# Patient Record
Sex: Female | Born: 1955 | Race: Black or African American | Hispanic: No | Marital: Single | Smoking: Former smoker
Health system: Southern US, Community
[De-identification: ages and names within clinical notes are randomized; demographics above are authoritative.]

## PROBLEM LIST (undated history)

## (undated) DIAGNOSIS — Z923 Personal history of irradiation: Secondary | ICD-10-CM

## (undated) DIAGNOSIS — E079 Disorder of thyroid, unspecified: Secondary | ICD-10-CM

## (undated) DIAGNOSIS — N189 Chronic kidney disease, unspecified: Secondary | ICD-10-CM

## (undated) DIAGNOSIS — M6740 Ganglion, unspecified site: Secondary | ICD-10-CM

## (undated) DIAGNOSIS — R0600 Dyspnea, unspecified: Secondary | ICD-10-CM

## (undated) DIAGNOSIS — Z780 Asymptomatic menopausal state: Secondary | ICD-10-CM

## (undated) DIAGNOSIS — E785 Hyperlipidemia, unspecified: Secondary | ICD-10-CM

## (undated) DIAGNOSIS — K219 Gastro-esophageal reflux disease without esophagitis: Secondary | ICD-10-CM

## (undated) DIAGNOSIS — I10 Essential (primary) hypertension: Secondary | ICD-10-CM

## (undated) DIAGNOSIS — D509 Iron deficiency anemia, unspecified: Secondary | ICD-10-CM

## (undated) DIAGNOSIS — T884XXA Failed or difficult intubation, initial encounter: Secondary | ICD-10-CM

## (undated) DIAGNOSIS — Z720 Tobacco use: Secondary | ICD-10-CM

## (undated) DIAGNOSIS — C801 Malignant (primary) neoplasm, unspecified: Secondary | ICD-10-CM

## (undated) HISTORY — DX: Disorder of thyroid, unspecified: E07.9

## (undated) HISTORY — PX: REDUCTION MAMMAPLASTY: SUR839

## (undated) HISTORY — PX: NECK SURGERY: SHX720

## (undated) HISTORY — DX: Personal history of irradiation: Z92.3

## (undated) HISTORY — DX: Tobacco use: Z72.0

## (undated) HISTORY — DX: Iron deficiency anemia, unspecified: D50.9

## (undated) HISTORY — DX: Gastro-esophageal reflux disease without esophagitis: K21.9

## (undated) HISTORY — DX: Hyperlipidemia, unspecified: E78.5

## (undated) HISTORY — DX: Chronic kidney disease, unspecified: N18.9

## (undated) HISTORY — DX: Malignant (primary) neoplasm, unspecified: C80.1

## (undated) HISTORY — DX: Essential (primary) hypertension: I10

## (undated) HISTORY — DX: Asymptomatic menopausal state: Z78.0

## (undated) HISTORY — DX: Ganglion, unspecified site: M67.40

---

## 1995-09-24 DIAGNOSIS — E118 Type 2 diabetes mellitus with unspecified complications: Secondary | ICD-10-CM

## 1999-11-16 ENCOUNTER — Emergency Department (HOSPITAL_COMMUNITY): Admission: EM | Admit: 1999-11-16 | Discharge: 1999-11-16 | Payer: Self-pay | Admitting: Emergency Medicine

## 2002-05-25 ENCOUNTER — Emergency Department (HOSPITAL_COMMUNITY): Admission: EM | Admit: 2002-05-25 | Discharge: 2002-05-25 | Payer: Self-pay | Admitting: Emergency Medicine

## 2002-05-25 ENCOUNTER — Encounter: Payer: Self-pay | Admitting: Emergency Medicine

## 2002-05-26 ENCOUNTER — Emergency Department (HOSPITAL_COMMUNITY): Admission: EM | Admit: 2002-05-26 | Discharge: 2002-05-27 | Payer: Self-pay | Admitting: Emergency Medicine

## 2002-06-30 ENCOUNTER — Encounter: Admission: RE | Admit: 2002-06-30 | Discharge: 2002-06-30 | Payer: Self-pay | Admitting: Internal Medicine

## 2002-07-05 ENCOUNTER — Encounter: Admission: RE | Admit: 2002-07-05 | Discharge: 2002-07-05 | Payer: Self-pay | Admitting: Internal Medicine

## 2002-07-13 ENCOUNTER — Encounter: Admission: RE | Admit: 2002-07-13 | Discharge: 2002-07-13 | Payer: Self-pay | Admitting: Internal Medicine

## 2002-09-07 ENCOUNTER — Encounter: Admission: RE | Admit: 2002-09-07 | Discharge: 2002-09-07 | Payer: Self-pay | Admitting: Internal Medicine

## 2002-11-03 ENCOUNTER — Encounter: Admission: RE | Admit: 2002-11-03 | Discharge: 2002-11-03 | Payer: Self-pay | Admitting: Internal Medicine

## 2002-12-19 ENCOUNTER — Emergency Department (HOSPITAL_COMMUNITY): Admission: EM | Admit: 2002-12-19 | Discharge: 2002-12-19 | Payer: Self-pay | Admitting: Emergency Medicine

## 2003-01-17 ENCOUNTER — Encounter: Admission: RE | Admit: 2003-01-17 | Discharge: 2003-01-17 | Payer: Self-pay | Admitting: Internal Medicine

## 2003-01-27 ENCOUNTER — Encounter: Admission: RE | Admit: 2003-01-27 | Discharge: 2003-01-27 | Payer: Self-pay | Admitting: Internal Medicine

## 2003-01-31 ENCOUNTER — Encounter: Admission: RE | Admit: 2003-01-31 | Discharge: 2003-01-31 | Payer: Self-pay | Admitting: Internal Medicine

## 2003-06-19 ENCOUNTER — Emergency Department (HOSPITAL_COMMUNITY): Admission: EM | Admit: 2003-06-19 | Discharge: 2003-06-19 | Payer: Self-pay

## 2003-07-01 ENCOUNTER — Encounter: Admission: RE | Admit: 2003-07-01 | Discharge: 2003-07-01 | Payer: Self-pay | Admitting: Internal Medicine

## 2003-09-07 ENCOUNTER — Encounter: Admission: RE | Admit: 2003-09-07 | Discharge: 2003-09-07 | Payer: Self-pay | Admitting: Internal Medicine

## 2003-09-27 ENCOUNTER — Encounter: Admission: RE | Admit: 2003-09-27 | Discharge: 2003-09-27 | Payer: Self-pay | Admitting: Internal Medicine

## 2003-10-24 ENCOUNTER — Encounter: Admission: RE | Admit: 2003-10-24 | Discharge: 2003-10-24 | Payer: Self-pay | Admitting: Internal Medicine

## 2003-10-26 ENCOUNTER — Encounter: Admission: RE | Admit: 2003-10-26 | Discharge: 2003-10-26 | Payer: Self-pay | Admitting: Internal Medicine

## 2003-10-30 ENCOUNTER — Emergency Department (HOSPITAL_COMMUNITY): Admission: EM | Admit: 2003-10-30 | Discharge: 2003-10-30 | Payer: Self-pay | Admitting: Emergency Medicine

## 2003-11-04 ENCOUNTER — Encounter: Admission: RE | Admit: 2003-11-04 | Discharge: 2003-11-04 | Payer: Self-pay | Admitting: Internal Medicine

## 2003-11-07 ENCOUNTER — Encounter: Admission: RE | Admit: 2003-11-07 | Discharge: 2003-11-07 | Payer: Self-pay | Admitting: Internal Medicine

## 2003-11-14 ENCOUNTER — Ambulatory Visit (HOSPITAL_COMMUNITY): Admission: RE | Admit: 2003-11-14 | Discharge: 2003-11-14 | Payer: Self-pay | Admitting: Internal Medicine

## 2003-11-18 ENCOUNTER — Encounter: Admission: RE | Admit: 2003-11-18 | Discharge: 2003-11-18 | Payer: Self-pay | Admitting: Internal Medicine

## 2003-12-26 ENCOUNTER — Encounter: Admission: RE | Admit: 2003-12-26 | Discharge: 2003-12-26 | Payer: Self-pay | Admitting: Internal Medicine

## 2003-12-26 ENCOUNTER — Encounter (INDEPENDENT_AMBULATORY_CARE_PROVIDER_SITE_OTHER): Payer: Self-pay | Admitting: *Deleted

## 2003-12-28 ENCOUNTER — Ambulatory Visit (HOSPITAL_COMMUNITY): Admission: RE | Admit: 2003-12-28 | Discharge: 2003-12-28 | Payer: Self-pay | Admitting: Internal Medicine

## 2004-01-09 ENCOUNTER — Encounter: Admission: RE | Admit: 2004-01-09 | Discharge: 2004-01-09 | Payer: Self-pay | Admitting: Internal Medicine

## 2004-02-29 ENCOUNTER — Encounter: Admission: RE | Admit: 2004-02-29 | Discharge: 2004-02-29 | Payer: Self-pay | Admitting: Internal Medicine

## 2004-03-08 ENCOUNTER — Encounter: Admission: RE | Admit: 2004-03-08 | Discharge: 2004-03-08 | Payer: Self-pay | Admitting: Obstetrics and Gynecology

## 2004-03-13 ENCOUNTER — Ambulatory Visit (HOSPITAL_COMMUNITY): Admission: RE | Admit: 2004-03-13 | Discharge: 2004-03-13 | Payer: Self-pay | Admitting: *Deleted

## 2004-04-02 ENCOUNTER — Ambulatory Visit (HOSPITAL_COMMUNITY): Admission: RE | Admit: 2004-04-02 | Discharge: 2004-04-02 | Payer: Self-pay | Admitting: Internal Medicine

## 2004-07-31 ENCOUNTER — Ambulatory Visit (HOSPITAL_COMMUNITY): Admission: RE | Admit: 2004-07-31 | Discharge: 2004-07-31 | Payer: Self-pay | Admitting: Internal Medicine

## 2004-07-31 ENCOUNTER — Ambulatory Visit: Payer: Self-pay | Admitting: Internal Medicine

## 2004-10-01 ENCOUNTER — Ambulatory Visit (HOSPITAL_COMMUNITY): Admission: RE | Admit: 2004-10-01 | Discharge: 2004-10-01 | Payer: Self-pay | Admitting: Family Medicine

## 2004-12-26 ENCOUNTER — Ambulatory Visit: Payer: Self-pay | Admitting: Internal Medicine

## 2005-02-26 ENCOUNTER — Ambulatory Visit: Payer: Self-pay | Admitting: Internal Medicine

## 2005-02-27 ENCOUNTER — Ambulatory Visit: Payer: Self-pay | Admitting: Internal Medicine

## 2005-07-19 ENCOUNTER — Ambulatory Visit: Payer: Self-pay | Admitting: Hospitalist

## 2005-10-17 ENCOUNTER — Ambulatory Visit: Payer: Self-pay | Admitting: Internal Medicine

## 2005-10-31 ENCOUNTER — Ambulatory Visit: Payer: Self-pay | Admitting: Internal Medicine

## 2005-11-13 ENCOUNTER — Ambulatory Visit: Payer: Self-pay | Admitting: Internal Medicine

## 2005-11-18 ENCOUNTER — Ambulatory Visit: Payer: Self-pay | Admitting: Internal Medicine

## 2006-01-15 ENCOUNTER — Ambulatory Visit: Payer: Self-pay | Admitting: Internal Medicine

## 2006-01-30 ENCOUNTER — Ambulatory Visit (HOSPITAL_COMMUNITY): Admission: RE | Admit: 2006-01-30 | Discharge: 2006-01-30 | Payer: Self-pay | Admitting: Obstetrics and Gynecology

## 2006-04-17 ENCOUNTER — Ambulatory Visit: Payer: Self-pay | Admitting: Internal Medicine

## 2006-06-11 ENCOUNTER — Ambulatory Visit: Payer: Self-pay | Admitting: Hospitalist

## 2006-06-19 ENCOUNTER — Ambulatory Visit: Payer: Self-pay | Admitting: Internal Medicine

## 2006-06-23 ENCOUNTER — Ambulatory Visit: Payer: Self-pay | Admitting: Hospitalist

## 2006-07-14 ENCOUNTER — Ambulatory Visit: Payer: Self-pay | Admitting: Internal Medicine

## 2006-07-15 ENCOUNTER — Ambulatory Visit: Payer: Self-pay | Admitting: Internal Medicine

## 2006-07-16 ENCOUNTER — Ambulatory Visit: Payer: Self-pay | Admitting: Internal Medicine

## 2006-07-25 ENCOUNTER — Ambulatory Visit: Payer: Self-pay | Admitting: Internal Medicine

## 2006-07-25 LAB — HM COLONOSCOPY: HM Colonoscopy: NORMAL

## 2006-08-19 DIAGNOSIS — M6740 Ganglion, unspecified site: Secondary | ICD-10-CM

## 2006-08-19 DIAGNOSIS — D509 Iron deficiency anemia, unspecified: Secondary | ICD-10-CM

## 2006-08-19 DIAGNOSIS — I10 Essential (primary) hypertension: Secondary | ICD-10-CM | POA: Insufficient documentation

## 2006-08-19 DIAGNOSIS — F17200 Nicotine dependence, unspecified, uncomplicated: Secondary | ICD-10-CM | POA: Insufficient documentation

## 2006-08-19 DIAGNOSIS — N951 Menopausal and female climacteric states: Secondary | ICD-10-CM

## 2006-08-28 ENCOUNTER — Ambulatory Visit: Payer: Self-pay | Admitting: Internal Medicine

## 2006-10-21 ENCOUNTER — Telehealth: Payer: Self-pay | Admitting: *Deleted

## 2006-11-24 ENCOUNTER — Telehealth: Payer: Self-pay | Admitting: *Deleted

## 2006-11-27 ENCOUNTER — Emergency Department (HOSPITAL_COMMUNITY): Admission: EM | Admit: 2006-11-27 | Discharge: 2006-11-27 | Payer: Self-pay | Admitting: Family Medicine

## 2006-12-26 ENCOUNTER — Encounter (INDEPENDENT_AMBULATORY_CARE_PROVIDER_SITE_OTHER): Payer: Self-pay | Admitting: Internal Medicine

## 2006-12-26 ENCOUNTER — Ambulatory Visit: Payer: Self-pay | Admitting: *Deleted

## 2006-12-29 ENCOUNTER — Telehealth: Payer: Self-pay | Admitting: *Deleted

## 2007-01-06 ENCOUNTER — Ambulatory Visit: Payer: Self-pay | Admitting: Hospitalist

## 2007-01-08 ENCOUNTER — Telehealth: Payer: Self-pay | Admitting: *Deleted

## 2007-01-29 ENCOUNTER — Ambulatory Visit: Payer: Self-pay | Admitting: Internal Medicine

## 2007-01-30 ENCOUNTER — Telehealth (INDEPENDENT_AMBULATORY_CARE_PROVIDER_SITE_OTHER): Payer: Self-pay | Admitting: Internal Medicine

## 2007-01-30 ENCOUNTER — Ambulatory Visit (HOSPITAL_COMMUNITY): Admission: RE | Admit: 2007-01-30 | Discharge: 2007-01-30 | Payer: Self-pay | Admitting: Internal Medicine

## 2007-02-05 ENCOUNTER — Ambulatory Visit (HOSPITAL_COMMUNITY): Admission: RE | Admit: 2007-02-05 | Discharge: 2007-02-05 | Payer: Self-pay | Admitting: Obstetrics and Gynecology

## 2007-02-06 ENCOUNTER — Ambulatory Visit: Payer: Self-pay | Admitting: Hospitalist

## 2007-02-06 ENCOUNTER — Encounter (INDEPENDENT_AMBULATORY_CARE_PROVIDER_SITE_OTHER): Payer: Self-pay | Admitting: Internal Medicine

## 2007-02-09 LAB — CONVERTED CEMR LAB
CO2: 23 meq/L (ref 19–32)
Calcium: 10.1 mg/dL (ref 8.4–10.5)
Creatinine, Ser: 0.92 mg/dL (ref 0.40–1.20)
Eosinophils Relative: 1 % (ref 0–5)
Glucose, Bld: 165 mg/dL — ABNORMAL HIGH (ref 70–99)
HCT: 39.8 % (ref 36.0–46.0)
Hemoglobin: 13.1 g/dL (ref 12.0–15.0)
Leukocytes, UA: NEGATIVE
Lymphocytes Relative: 41 % (ref 12–46)
Lymphs Abs: 4.4 10*3/uL — ABNORMAL HIGH (ref 0.7–3.3)
Microalb Creat Ratio: 82.7 mg/g — ABNORMAL HIGH (ref 0.0–30.0)
Monocytes Absolute: 0.5 10*3/uL (ref 0.2–0.7)
Nitrite: NEGATIVE
Protein, ur: NEGATIVE mg/dL
Sodium: 136 meq/L (ref 135–145)
Total Bilirubin: 0.3 mg/dL (ref 0.3–1.2)
Total Protein: 7.8 g/dL (ref 6.0–8.3)
Urine Glucose: NEGATIVE mg/dL
WBC: 10.6 10*3/uL — ABNORMAL HIGH (ref 4.0–10.5)

## 2007-02-11 ENCOUNTER — Emergency Department (HOSPITAL_COMMUNITY): Admission: EM | Admit: 2007-02-11 | Discharge: 2007-02-11 | Payer: Self-pay | Admitting: Emergency Medicine

## 2007-02-18 ENCOUNTER — Telehealth (INDEPENDENT_AMBULATORY_CARE_PROVIDER_SITE_OTHER): Payer: Self-pay | Admitting: *Deleted

## 2007-02-25 ENCOUNTER — Encounter (INDEPENDENT_AMBULATORY_CARE_PROVIDER_SITE_OTHER): Payer: Self-pay | Admitting: Internal Medicine

## 2007-02-25 ENCOUNTER — Ambulatory Visit: Payer: Self-pay | Admitting: Internal Medicine

## 2007-02-25 ENCOUNTER — Telehealth: Payer: Self-pay | Admitting: *Deleted

## 2007-02-25 LAB — CONVERTED CEMR LAB: Blood Glucose, Fingerstick: 169

## 2007-02-26 LAB — CONVERTED CEMR LAB
Calcium: 9.7 mg/dL (ref 8.4–10.5)
Chloride: 95 meq/L — ABNORMAL LOW (ref 96–112)
Creatinine, Ser: 0.65 mg/dL (ref 0.40–1.20)

## 2007-03-06 ENCOUNTER — Encounter (INDEPENDENT_AMBULATORY_CARE_PROVIDER_SITE_OTHER): Payer: Self-pay | Admitting: Internal Medicine

## 2007-03-06 ENCOUNTER — Ambulatory Visit: Payer: Self-pay | Admitting: Internal Medicine

## 2007-03-09 LAB — CONVERTED CEMR LAB
BUN: 7 mg/dL (ref 6–23)
Chloride: 99 meq/L (ref 96–112)
Potassium: 3.1 meq/L — ABNORMAL LOW (ref 3.5–5.3)
Sodium: 135 meq/L (ref 135–145)

## 2007-03-13 ENCOUNTER — Encounter (INDEPENDENT_AMBULATORY_CARE_PROVIDER_SITE_OTHER): Payer: Self-pay | Admitting: Internal Medicine

## 2007-03-13 ENCOUNTER — Ambulatory Visit: Payer: Self-pay | Admitting: Internal Medicine

## 2007-03-13 LAB — CONVERTED CEMR LAB
CO2: 21 meq/L (ref 19–32)
Chloride: 96 meq/L (ref 96–112)
Glucose, Bld: 109 mg/dL — ABNORMAL HIGH (ref 70–99)
Potassium: 3.3 meq/L — ABNORMAL LOW (ref 3.5–5.3)
Sodium: 132 meq/L — ABNORMAL LOW (ref 135–145)

## 2007-03-30 ENCOUNTER — Ambulatory Visit: Payer: Self-pay | Admitting: Pulmonary Disease

## 2007-04-08 ENCOUNTER — Encounter (INDEPENDENT_AMBULATORY_CARE_PROVIDER_SITE_OTHER): Payer: Self-pay | Admitting: Internal Medicine

## 2007-04-08 ENCOUNTER — Ambulatory Visit: Admission: RE | Admit: 2007-04-08 | Discharge: 2007-04-08 | Payer: Self-pay | Admitting: Pulmonary Disease

## 2007-04-22 ENCOUNTER — Ambulatory Visit: Payer: Self-pay | Admitting: Pulmonary Disease

## 2007-04-30 ENCOUNTER — Encounter (INDEPENDENT_AMBULATORY_CARE_PROVIDER_SITE_OTHER): Payer: Self-pay | Admitting: Internal Medicine

## 2007-04-30 ENCOUNTER — Ambulatory Visit: Payer: Self-pay | Admitting: Infectious Disease

## 2007-05-03 LAB — CONVERTED CEMR LAB
BUN: 6 mg/dL (ref 6–23)
Calcium: 9.4 mg/dL (ref 8.4–10.5)
Chloride: 100 meq/L (ref 96–112)
Creatinine, Ser: 0.71 mg/dL (ref 0.40–1.20)

## 2007-05-12 ENCOUNTER — Telehealth: Payer: Self-pay | Admitting: *Deleted

## 2007-06-03 ENCOUNTER — Telehealth: Payer: Self-pay | Admitting: *Deleted

## 2007-06-05 ENCOUNTER — Ambulatory Visit: Payer: Self-pay | Admitting: Internal Medicine

## 2007-06-05 ENCOUNTER — Encounter (INDEPENDENT_AMBULATORY_CARE_PROVIDER_SITE_OTHER): Payer: Self-pay | Admitting: Internal Medicine

## 2007-06-05 LAB — CONVERTED CEMR LAB
Blood Glucose, Fingerstick: 171
Calcium: 9.5 mg/dL (ref 8.4–10.5)
Chloride: 96 meq/L (ref 96–112)
Creatinine, Ser: 0.71 mg/dL (ref 0.40–1.20)
HCT: 35.4 % — ABNORMAL LOW (ref 36.0–46.0)
Hgb A1c MFr Bld: 8.5 %
Platelets: 279 10*3/uL (ref 150–400)
RDW: 13.8 % (ref 11.5–14.0)
Sodium: 133 meq/L — ABNORMAL LOW (ref 135–145)

## 2007-06-12 ENCOUNTER — Ambulatory Visit: Payer: Self-pay | Admitting: Pulmonary Disease

## 2007-06-24 ENCOUNTER — Telehealth: Payer: Self-pay | Admitting: *Deleted

## 2007-07-01 ENCOUNTER — Ambulatory Visit: Payer: Self-pay | Admitting: Infectious Diseases

## 2007-07-01 ENCOUNTER — Encounter (INDEPENDENT_AMBULATORY_CARE_PROVIDER_SITE_OTHER): Payer: Self-pay | Admitting: Internal Medicine

## 2007-07-01 LAB — CONVERTED CEMR LAB
HDL: 36 mg/dL — ABNORMAL LOW (ref >39)
LDL Cholesterol: 70 mg/dL (ref 0–99)
Triglycerides: 78 mg/dL (ref <150)

## 2007-07-08 ENCOUNTER — Telehealth: Payer: Self-pay | Admitting: *Deleted

## 2007-07-15 ENCOUNTER — Telehealth (INDEPENDENT_AMBULATORY_CARE_PROVIDER_SITE_OTHER): Payer: Self-pay | Admitting: Internal Medicine

## 2007-09-10 ENCOUNTER — Telehealth: Payer: Self-pay | Admitting: Internal Medicine

## 2007-10-15 ENCOUNTER — Ambulatory Visit: Payer: Self-pay | Admitting: Internal Medicine

## 2007-10-15 ENCOUNTER — Encounter: Payer: Self-pay | Admitting: Internal Medicine

## 2007-10-19 ENCOUNTER — Encounter (INDEPENDENT_AMBULATORY_CARE_PROVIDER_SITE_OTHER): Payer: Self-pay | Admitting: Internal Medicine

## 2007-10-19 ENCOUNTER — Ambulatory Visit: Payer: Self-pay | Admitting: Internal Medicine

## 2007-10-21 ENCOUNTER — Encounter (INDEPENDENT_AMBULATORY_CARE_PROVIDER_SITE_OTHER): Payer: Self-pay | Admitting: Internal Medicine

## 2007-10-21 LAB — CONVERTED CEMR LAB
CO2: 25 meq/L (ref 19–32)
Glucose, Bld: 271 mg/dL — ABNORMAL HIGH (ref 70–99)
Potassium: 3 meq/L — ABNORMAL LOW (ref 3.5–5.3)
Sodium: 132 meq/L — ABNORMAL LOW (ref 135–145)

## 2007-10-26 ENCOUNTER — Ambulatory Visit: Payer: Self-pay | Admitting: Hospitalist

## 2007-10-26 ENCOUNTER — Encounter (INDEPENDENT_AMBULATORY_CARE_PROVIDER_SITE_OTHER): Payer: Self-pay | Admitting: Internal Medicine

## 2007-10-26 LAB — CONVERTED CEMR LAB
CO2: 21 meq/L (ref 19–32)
Calcium: 9.8 mg/dL (ref 8.4–10.5)
Creatinine, Ser: 0.85 mg/dL (ref 0.40–1.20)
Glucose, Bld: 116 mg/dL — ABNORMAL HIGH (ref 70–99)

## 2007-11-23 ENCOUNTER — Telehealth (INDEPENDENT_AMBULATORY_CARE_PROVIDER_SITE_OTHER): Payer: Self-pay | Admitting: *Deleted

## 2007-12-24 ENCOUNTER — Encounter (INDEPENDENT_AMBULATORY_CARE_PROVIDER_SITE_OTHER): Payer: Self-pay | Admitting: Internal Medicine

## 2007-12-24 ENCOUNTER — Telehealth (INDEPENDENT_AMBULATORY_CARE_PROVIDER_SITE_OTHER): Payer: Self-pay | Admitting: Internal Medicine

## 2008-01-11 ENCOUNTER — Telehealth (INDEPENDENT_AMBULATORY_CARE_PROVIDER_SITE_OTHER): Payer: Self-pay | Admitting: Internal Medicine

## 2008-01-27 ENCOUNTER — Telehealth (INDEPENDENT_AMBULATORY_CARE_PROVIDER_SITE_OTHER): Payer: Self-pay | Admitting: Internal Medicine

## 2008-02-02 ENCOUNTER — Telehealth (INDEPENDENT_AMBULATORY_CARE_PROVIDER_SITE_OTHER): Payer: Self-pay | Admitting: Internal Medicine

## 2008-02-02 ENCOUNTER — Encounter (INDEPENDENT_AMBULATORY_CARE_PROVIDER_SITE_OTHER): Payer: Self-pay | Admitting: Internal Medicine

## 2008-02-02 ENCOUNTER — Ambulatory Visit: Payer: Self-pay | Admitting: Internal Medicine

## 2008-02-02 LAB — CONVERTED CEMR LAB
ALT: 32 units/L (ref 0–35)
AST: 26 units/L (ref 0–37)
Creatinine, Ser: 0.63 mg/dL (ref 0.40–1.20)
Total Bilirubin: 0.3 mg/dL (ref 0.3–1.2)

## 2008-02-08 ENCOUNTER — Ambulatory Visit (HOSPITAL_COMMUNITY): Admission: RE | Admit: 2008-02-08 | Discharge: 2008-02-08 | Payer: Self-pay | Admitting: Internal Medicine

## 2008-02-16 ENCOUNTER — Ambulatory Visit: Payer: Self-pay | Admitting: Internal Medicine

## 2008-02-16 LAB — CONVERTED CEMR LAB
OCCULT 1: NEGATIVE
OCCULT 2: NEGATIVE
OCCULT 3: NEGATIVE

## 2008-03-01 ENCOUNTER — Encounter (INDEPENDENT_AMBULATORY_CARE_PROVIDER_SITE_OTHER): Payer: Self-pay | Admitting: Internal Medicine

## 2008-03-15 ENCOUNTER — Encounter (INDEPENDENT_AMBULATORY_CARE_PROVIDER_SITE_OTHER): Payer: Self-pay | Admitting: Internal Medicine

## 2008-03-15 ENCOUNTER — Ambulatory Visit: Payer: Self-pay | Admitting: Internal Medicine

## 2008-03-15 DIAGNOSIS — E785 Hyperlipidemia, unspecified: Secondary | ICD-10-CM

## 2008-03-16 LAB — CONVERTED CEMR LAB
Creatinine, Urine: 31.8 mg/dL
Microalb, Ur: 6.78 mg/dL — ABNORMAL HIGH (ref 0.00–1.89)

## 2008-03-28 ENCOUNTER — Telehealth (INDEPENDENT_AMBULATORY_CARE_PROVIDER_SITE_OTHER): Payer: Self-pay | Admitting: *Deleted

## 2008-06-23 ENCOUNTER — Ambulatory Visit: Payer: Self-pay | Admitting: Internal Medicine

## 2008-07-21 ENCOUNTER — Ambulatory Visit: Payer: Self-pay | Admitting: Internal Medicine

## 2008-07-21 DIAGNOSIS — K219 Gastro-esophageal reflux disease without esophagitis: Secondary | ICD-10-CM | POA: Insufficient documentation

## 2008-07-21 DIAGNOSIS — R809 Proteinuria, unspecified: Secondary | ICD-10-CM

## 2008-07-21 LAB — CONVERTED CEMR LAB
Blood Glucose, Fingerstick: 211
Hgb A1c MFr Bld: 9.6 %

## 2008-08-09 ENCOUNTER — Ambulatory Visit: Payer: Self-pay | Admitting: Infectious Diseases

## 2008-08-09 ENCOUNTER — Encounter (INDEPENDENT_AMBULATORY_CARE_PROVIDER_SITE_OTHER): Payer: Self-pay | Admitting: *Deleted

## 2008-08-17 ENCOUNTER — Encounter (INDEPENDENT_AMBULATORY_CARE_PROVIDER_SITE_OTHER): Payer: Self-pay | Admitting: *Deleted

## 2008-08-17 ENCOUNTER — Ambulatory Visit: Payer: Self-pay | Admitting: *Deleted

## 2008-08-17 LAB — CONVERTED CEMR LAB
AST: 21 units/L (ref 0–37)
Albumin: 4.4 g/dL (ref 3.5–5.2)
Alkaline Phosphatase: 96 units/L (ref 39–117)
BUN: 13 mg/dL (ref 6–23)
Calcium: 9.8 mg/dL (ref 8.4–10.5)
Chloride: 101 meq/L (ref 96–112)
Creatinine, Ser: 0.65 mg/dL (ref 0.40–1.20)
HDL: 36 mg/dL — ABNORMAL LOW (ref >39)
Total CHOL/HDL Ratio: 3.7
Triglycerides: 105 mg/dL (ref <150)

## 2008-09-20 ENCOUNTER — Telehealth (INDEPENDENT_AMBULATORY_CARE_PROVIDER_SITE_OTHER): Payer: Self-pay | Admitting: *Deleted

## 2008-09-20 ENCOUNTER — Telehealth: Payer: Self-pay | Admitting: Infectious Diseases

## 2008-10-14 ENCOUNTER — Telehealth (INDEPENDENT_AMBULATORY_CARE_PROVIDER_SITE_OTHER): Payer: Self-pay | Admitting: *Deleted

## 2008-11-10 ENCOUNTER — Encounter (INDEPENDENT_AMBULATORY_CARE_PROVIDER_SITE_OTHER): Payer: Self-pay | Admitting: *Deleted

## 2008-11-10 ENCOUNTER — Ambulatory Visit: Payer: Self-pay | Admitting: Internal Medicine

## 2008-11-10 LAB — CONVERTED CEMR LAB: Hgb A1c MFr Bld: 9.9 %

## 2008-11-24 ENCOUNTER — Encounter (INDEPENDENT_AMBULATORY_CARE_PROVIDER_SITE_OTHER): Payer: Self-pay | Admitting: *Deleted

## 2008-11-24 ENCOUNTER — Ambulatory Visit: Payer: Self-pay | Admitting: Internal Medicine

## 2008-11-25 ENCOUNTER — Telehealth (INDEPENDENT_AMBULATORY_CARE_PROVIDER_SITE_OTHER): Payer: Self-pay | Admitting: *Deleted

## 2008-11-25 LAB — CONVERTED CEMR LAB
Albumin: 4.5 g/dL (ref 3.5–5.2)
Alkaline Phosphatase: 86 units/L (ref 39–117)
BUN: 10 mg/dL (ref 6–23)
CO2: 23 meq/L (ref 19–32)
Calcium: 9.1 mg/dL (ref 8.4–10.5)
Cholesterol: 122 mg/dL (ref 0–200)
Glucose, Bld: 235 mg/dL — ABNORMAL HIGH (ref 70–99)
HDL: 34 mg/dL — ABNORMAL LOW (ref >39)
Potassium: 3.5 meq/L (ref 3.5–5.3)
Triglycerides: 112 mg/dL (ref <150)

## 2008-12-08 ENCOUNTER — Ambulatory Visit: Payer: Self-pay | Admitting: Internal Medicine

## 2008-12-08 ENCOUNTER — Telehealth (INDEPENDENT_AMBULATORY_CARE_PROVIDER_SITE_OTHER): Payer: Self-pay | Admitting: *Deleted

## 2008-12-08 ENCOUNTER — Encounter (INDEPENDENT_AMBULATORY_CARE_PROVIDER_SITE_OTHER): Payer: Self-pay | Admitting: *Deleted

## 2008-12-08 LAB — CONVERTED CEMR LAB

## 2008-12-21 ENCOUNTER — Telehealth (INDEPENDENT_AMBULATORY_CARE_PROVIDER_SITE_OTHER): Payer: Self-pay | Admitting: *Deleted

## 2009-01-11 ENCOUNTER — Ambulatory Visit: Payer: Self-pay | Admitting: Internal Medicine

## 2009-01-11 DIAGNOSIS — D179 Benign lipomatous neoplasm, unspecified: Secondary | ICD-10-CM | POA: Insufficient documentation

## 2009-02-02 ENCOUNTER — Telehealth (INDEPENDENT_AMBULATORY_CARE_PROVIDER_SITE_OTHER): Payer: Self-pay | Admitting: *Deleted

## 2009-02-16 ENCOUNTER — Telehealth (INDEPENDENT_AMBULATORY_CARE_PROVIDER_SITE_OTHER): Payer: Self-pay | Admitting: *Deleted

## 2009-02-16 ENCOUNTER — Ambulatory Visit: Payer: Self-pay | Admitting: Internal Medicine

## 2009-02-16 ENCOUNTER — Encounter (INDEPENDENT_AMBULATORY_CARE_PROVIDER_SITE_OTHER): Payer: Self-pay | Admitting: *Deleted

## 2009-02-16 DIAGNOSIS — J069 Acute upper respiratory infection, unspecified: Secondary | ICD-10-CM | POA: Insufficient documentation

## 2009-02-16 LAB — CONVERTED CEMR LAB
Blood Glucose, Fingerstick: 202
Hgb A1c MFr Bld: 10.9 %

## 2009-02-17 ENCOUNTER — Encounter (INDEPENDENT_AMBULATORY_CARE_PROVIDER_SITE_OTHER): Payer: Self-pay | Admitting: *Deleted

## 2009-02-21 ENCOUNTER — Encounter (INDEPENDENT_AMBULATORY_CARE_PROVIDER_SITE_OTHER): Payer: Self-pay | Admitting: *Deleted

## 2009-03-06 ENCOUNTER — Ambulatory Visit (HOSPITAL_COMMUNITY): Admission: RE | Admit: 2009-03-06 | Discharge: 2009-03-06 | Payer: Self-pay | Admitting: Internal Medicine

## 2009-03-13 ENCOUNTER — Telehealth: Payer: Self-pay | Admitting: Internal Medicine

## 2009-03-13 ENCOUNTER — Ambulatory Visit: Payer: Self-pay | Admitting: Internal Medicine

## 2009-03-14 ENCOUNTER — Telehealth (INDEPENDENT_AMBULATORY_CARE_PROVIDER_SITE_OTHER): Payer: Self-pay | Admitting: *Deleted

## 2009-03-15 ENCOUNTER — Encounter (INDEPENDENT_AMBULATORY_CARE_PROVIDER_SITE_OTHER): Payer: Self-pay | Admitting: *Deleted

## 2009-05-03 ENCOUNTER — Telehealth (INDEPENDENT_AMBULATORY_CARE_PROVIDER_SITE_OTHER): Payer: Self-pay | Admitting: Internal Medicine

## 2009-06-22 ENCOUNTER — Ambulatory Visit: Payer: Self-pay | Admitting: Internal Medicine

## 2009-06-26 ENCOUNTER — Telehealth: Payer: Self-pay | Admitting: Internal Medicine

## 2009-07-07 ENCOUNTER — Telehealth (INDEPENDENT_AMBULATORY_CARE_PROVIDER_SITE_OTHER): Payer: Self-pay | Admitting: *Deleted

## 2009-07-24 ENCOUNTER — Telehealth: Payer: Self-pay | Admitting: Internal Medicine

## 2009-07-27 ENCOUNTER — Ambulatory Visit: Payer: Self-pay | Admitting: Internal Medicine

## 2009-07-27 LAB — CONVERTED CEMR LAB
Blood Glucose, Fingerstick: 216
Hgb A1c MFr Bld: 9.7 %
MCHC: 31.7 g/dL (ref 30.0–36.0)
Platelets: 288 10*3/uL (ref 150–400)
RDW: 14.3 % (ref 11.5–15.5)

## 2009-08-14 ENCOUNTER — Telehealth (INDEPENDENT_AMBULATORY_CARE_PROVIDER_SITE_OTHER): Payer: Self-pay | Admitting: *Deleted

## 2009-09-04 ENCOUNTER — Telehealth: Payer: Self-pay | Admitting: Internal Medicine

## 2009-09-26 ENCOUNTER — Ambulatory Visit: Payer: Self-pay | Admitting: Internal Medicine

## 2009-09-26 DIAGNOSIS — R599 Enlarged lymph nodes, unspecified: Secondary | ICD-10-CM | POA: Insufficient documentation

## 2009-09-26 LAB — CONVERTED CEMR LAB
Basophils Absolute: 0 10*3/uL (ref 0.0–0.1)
Blood Glucose, Fingerstick: 280
Eosinophils Absolute: 0.1 10*3/uL (ref 0.0–0.7)
Ferritin: 99 ng/mL (ref 10–291)
Lymphocytes Relative: 45 % (ref 12–46)
Lymphs Abs: 4.7 10*3/uL — ABNORMAL HIGH (ref 0.7–4.0)
Neutrophils Relative %: 48 % (ref 43–77)
Platelets: 225 10*3/uL (ref 150–400)
RDW: 14.3 % (ref 11.5–15.5)
WBC: 10.5 10*3/uL (ref 4.0–10.5)

## 2009-10-02 ENCOUNTER — Telehealth: Payer: Self-pay | Admitting: Internal Medicine

## 2009-10-19 ENCOUNTER — Ambulatory Visit: Payer: Self-pay | Admitting: Internal Medicine

## 2009-10-23 ENCOUNTER — Ambulatory Visit (HOSPITAL_COMMUNITY): Admission: RE | Admit: 2009-10-23 | Discharge: 2009-10-23 | Payer: Self-pay | Admitting: Internal Medicine

## 2009-10-23 ENCOUNTER — Encounter (INDEPENDENT_AMBULATORY_CARE_PROVIDER_SITE_OTHER): Payer: Self-pay | Admitting: Internal Medicine

## 2009-10-24 ENCOUNTER — Telehealth: Payer: Self-pay | Admitting: Internal Medicine

## 2009-10-24 ENCOUNTER — Telehealth: Payer: Self-pay | Admitting: *Deleted

## 2009-10-25 ENCOUNTER — Telehealth: Payer: Self-pay | Admitting: Internal Medicine

## 2009-10-25 ENCOUNTER — Other Ambulatory Visit: Admission: RE | Admit: 2009-10-25 | Discharge: 2009-10-25 | Payer: Self-pay | Admitting: Otolaryngology

## 2009-10-31 ENCOUNTER — Encounter (INDEPENDENT_AMBULATORY_CARE_PROVIDER_SITE_OTHER): Payer: Self-pay | Admitting: Internal Medicine

## 2009-10-31 ENCOUNTER — Encounter: Payer: Self-pay | Admitting: Internal Medicine

## 2009-11-06 ENCOUNTER — Ambulatory Visit (HOSPITAL_COMMUNITY): Admission: RE | Admit: 2009-11-06 | Discharge: 2009-11-06 | Payer: Self-pay | Admitting: Otolaryngology

## 2009-11-08 ENCOUNTER — Encounter: Payer: Self-pay | Admitting: Internal Medicine

## 2009-11-09 ENCOUNTER — Ambulatory Visit: Payer: Self-pay | Admitting: Oncology

## 2009-11-14 ENCOUNTER — Ambulatory Visit: Admission: RE | Admit: 2009-11-14 | Discharge: 2010-01-29 | Payer: Self-pay | Admitting: Radiation Oncology

## 2009-11-15 ENCOUNTER — Encounter: Payer: Self-pay | Admitting: Internal Medicine

## 2009-11-15 LAB — CBC WITH DIFFERENTIAL/PLATELET
BASO%: 0.1 % (ref 0.0–2.0)
Eosinophils Absolute: 0.1 10*3/uL (ref 0.0–0.5)
MCHC: 32.8 g/dL (ref 31.5–36.0)
MONO#: 0.6 10*3/uL (ref 0.1–0.9)
NEUT#: 5.6 10*3/uL (ref 1.5–6.5)
RBC: 5.17 10*6/uL (ref 3.70–5.45)
WBC: 10.6 10*3/uL — ABNORMAL HIGH (ref 3.9–10.3)
lymph#: 4.3 10*3/uL — ABNORMAL HIGH (ref 0.9–3.3)
nRBC: 0 % (ref 0–0)

## 2009-11-15 LAB — COMPREHENSIVE METABOLIC PANEL
ALT: 29 U/L (ref 0–35)
AST: 27 U/L (ref 0–37)
Albumin: 4.1 g/dL (ref 3.5–5.2)
CO2: 26 mEq/L (ref 19–32)
Calcium: 9.3 mg/dL (ref 8.4–10.5)
Chloride: 100 mEq/L (ref 96–112)
Potassium: 3 mEq/L — ABNORMAL LOW (ref 3.5–5.3)
Sodium: 133 mEq/L — ABNORMAL LOW (ref 135–145)
Total Protein: 7.2 g/dL (ref 6.0–8.3)

## 2009-11-23 ENCOUNTER — Encounter: Payer: Self-pay | Admitting: Internal Medicine

## 2009-11-24 ENCOUNTER — Telehealth: Payer: Self-pay | Admitting: Internal Medicine

## 2009-11-29 ENCOUNTER — Encounter: Payer: Self-pay | Admitting: Internal Medicine

## 2009-11-29 DIAGNOSIS — C140 Malignant neoplasm of pharynx, unspecified: Secondary | ICD-10-CM | POA: Insufficient documentation

## 2009-12-11 ENCOUNTER — Ambulatory Visit: Payer: Self-pay | Admitting: Oncology

## 2009-12-11 ENCOUNTER — Telehealth: Payer: Self-pay | Admitting: Internal Medicine

## 2009-12-22 ENCOUNTER — Encounter: Payer: Self-pay | Admitting: Internal Medicine

## 2009-12-22 DIAGNOSIS — C801 Malignant (primary) neoplasm, unspecified: Secondary | ICD-10-CM

## 2009-12-22 HISTORY — DX: Malignant (primary) neoplasm, unspecified: C80.1

## 2009-12-28 ENCOUNTER — Encounter: Payer: Self-pay | Admitting: Internal Medicine

## 2010-01-10 ENCOUNTER — Ambulatory Visit: Payer: Self-pay | Admitting: Internal Medicine

## 2010-01-10 LAB — CONVERTED CEMR LAB: Hgb A1c MFr Bld: 9.8 %

## 2010-01-11 ENCOUNTER — Encounter: Payer: Self-pay | Admitting: Internal Medicine

## 2010-01-11 LAB — CONVERTED CEMR LAB
ALT: 29 units/L (ref 0–35)
AST: 24 units/L (ref 0–37)
Albumin: 3.8 g/dL (ref 3.5–5.2)
CO2: 23 meq/L (ref 19–32)
Calcium: 8.7 mg/dL (ref 8.4–10.5)
Chloride: 103 meq/L (ref 96–112)
Cholesterol: 98 mg/dL (ref 0–200)
Creatinine, Ser: 0.52 mg/dL (ref 0.40–1.20)
Creatinine, Urine: 57.1 mg/dL
Microalb Creat Ratio: 344.3 mg/g — ABNORMAL HIGH (ref 0.0–30.0)
Potassium: 3.8 meq/L (ref 3.5–5.3)
Total CHOL/HDL Ratio: 3.3

## 2010-01-15 ENCOUNTER — Encounter: Payer: Self-pay | Admitting: Internal Medicine

## 2010-01-22 ENCOUNTER — Telehealth: Payer: Self-pay | Admitting: Internal Medicine

## 2010-01-24 ENCOUNTER — Encounter: Payer: Self-pay | Admitting: Internal Medicine

## 2010-02-01 ENCOUNTER — Encounter: Payer: Self-pay | Admitting: Internal Medicine

## 2010-02-07 ENCOUNTER — Ambulatory Visit: Admission: RE | Admit: 2010-02-07 | Discharge: 2010-04-13 | Payer: Self-pay | Admitting: Radiation Oncology

## 2010-02-08 ENCOUNTER — Ambulatory Visit: Payer: Self-pay | Admitting: Oncology

## 2010-02-09 ENCOUNTER — Encounter: Payer: Self-pay | Admitting: Internal Medicine

## 2010-02-09 LAB — COMPREHENSIVE METABOLIC PANEL
ALT: 21 U/L (ref 0–35)
CO2: 24 mEq/L (ref 19–32)
Calcium: 9.4 mg/dL (ref 8.4–10.5)
Chloride: 98 mEq/L (ref 96–112)
Creatinine, Ser: 0.76 mg/dL (ref 0.40–1.20)
Glucose, Bld: 345 mg/dL — ABNORMAL HIGH (ref 70–99)
Sodium: 134 mEq/L — ABNORMAL LOW (ref 135–145)
Total Bilirubin: 0.3 mg/dL (ref 0.3–1.2)
Total Protein: 6.6 g/dL (ref 6.0–8.3)

## 2010-02-09 LAB — CBC WITH DIFFERENTIAL/PLATELET
BASO%: 0.4 % (ref 0.0–2.0)
Eosinophils Absolute: 0.1 10*3/uL (ref 0.0–0.5)
HCT: 36.7 % (ref 34.8–46.6)
HGB: 11.8 g/dL (ref 11.6–15.9)
LYMPH%: 44.3 % (ref 14.0–49.7)
MCHC: 32.2 g/dL (ref 31.5–36.0)
MONO#: 0.4 10*3/uL (ref 0.1–0.9)
NEUT#: 4.3 10*3/uL (ref 1.5–6.5)
NEUT%: 49.8 % (ref 38.4–76.8)
Platelets: 221 10*3/uL (ref 145–400)
WBC: 8.6 10*3/uL (ref 3.9–10.3)
lymph#: 3.8 10*3/uL — ABNORMAL HIGH (ref 0.9–3.3)

## 2010-02-12 ENCOUNTER — Telehealth: Payer: Self-pay | Admitting: Internal Medicine

## 2010-02-15 ENCOUNTER — Ambulatory Visit (HOSPITAL_COMMUNITY): Admission: RE | Admit: 2010-02-15 | Discharge: 2010-02-15 | Payer: Self-pay | Admitting: Radiation Oncology

## 2010-02-20 ENCOUNTER — Telehealth: Payer: Self-pay | Admitting: Internal Medicine

## 2010-02-23 LAB — CBC WITH DIFFERENTIAL/PLATELET
BASO%: 0.2 % (ref 0.0–2.0)
EOS%: 0.7 % (ref 0.0–7.0)
HCT: 35.7 % (ref 34.8–46.6)
MCH: 22.7 pg — ABNORMAL LOW (ref 25.1–34.0)
MCHC: 31.7 g/dL (ref 31.5–36.0)
MONO#: 0.7 10*3/uL (ref 0.1–0.9)
NEUT%: 67.6 % (ref 38.4–76.8)
RDW: 14.3 % (ref 11.2–14.5)
WBC: 13.2 10*3/uL — ABNORMAL HIGH (ref 3.9–10.3)
lymph#: 3.4 10*3/uL — ABNORMAL HIGH (ref 0.9–3.3)

## 2010-02-23 LAB — COMPREHENSIVE METABOLIC PANEL
ALT: 25 U/L (ref 0–35)
AST: 18 U/L (ref 0–37)
Albumin: 3.9 g/dL (ref 3.5–5.2)
CO2: 23 mEq/L (ref 19–32)
Calcium: 8.9 mg/dL (ref 8.4–10.5)
Chloride: 97 mEq/L (ref 96–112)
Creatinine, Ser: 0.62 mg/dL (ref 0.40–1.20)
Potassium: 3.9 mEq/L (ref 3.5–5.3)
Sodium: 134 mEq/L — ABNORMAL LOW (ref 135–145)
Total Protein: 6.9 g/dL (ref 6.0–8.3)

## 2010-02-23 LAB — MAGNESIUM: Magnesium: 2.1 mg/dL (ref 1.5–2.5)

## 2010-03-09 ENCOUNTER — Encounter: Payer: Self-pay | Admitting: Internal Medicine

## 2010-03-09 LAB — CBC WITH DIFFERENTIAL/PLATELET
BASO%: 0 % (ref 0.0–2.0)
Basophils Absolute: 0 10*3/uL (ref 0.0–0.1)
EOS%: 0.9 % (ref 0.0–7.0)
HGB: 10.7 g/dL — ABNORMAL LOW (ref 11.6–15.9)
MCH: 23.1 pg — ABNORMAL LOW (ref 25.1–34.0)
MCHC: 32.2 g/dL (ref 31.5–36.0)
MCV: 71.7 fL — ABNORMAL LOW (ref 79.5–101.0)
MONO%: 6.4 % (ref 0.0–14.0)
RBC: 4.64 10*6/uL (ref 3.70–5.45)
RDW: 15.3 % — ABNORMAL HIGH (ref 11.2–14.5)
lymph#: 1.5 10*3/uL (ref 0.9–3.3)

## 2010-03-09 LAB — COMPREHENSIVE METABOLIC PANEL
ALT: 27 U/L (ref 0–35)
AST: 33 U/L (ref 0–37)
Albumin: 3.6 g/dL (ref 3.5–5.2)
Alkaline Phosphatase: 88 U/L (ref 39–117)
BUN: 9 mg/dL (ref 6–23)
Chloride: 102 mEq/L (ref 96–112)
Potassium: 4.3 mEq/L (ref 3.5–5.3)

## 2010-03-13 ENCOUNTER — Ambulatory Visit: Payer: Self-pay | Admitting: Oncology

## 2010-03-16 ENCOUNTER — Encounter: Payer: Self-pay | Admitting: Internal Medicine

## 2010-03-16 LAB — CBC WITH DIFFERENTIAL/PLATELET
Basophils Absolute: 0 10*3/uL (ref 0.0–0.1)
Eosinophils Absolute: 0.1 10*3/uL (ref 0.0–0.5)
HCT: 32.3 % — ABNORMAL LOW (ref 34.8–46.6)
LYMPH%: 17.3 % (ref 14.0–49.7)
MCV: 71.6 fL — ABNORMAL LOW (ref 79.5–101.0)
MONO#: 0.6 10*3/uL (ref 0.1–0.9)
MONO%: 9.4 % (ref 0.0–14.0)
NEUT#: 4.8 10*3/uL (ref 1.5–6.5)
NEUT%: 72.2 % (ref 38.4–76.8)
Platelets: 178 10*3/uL (ref 145–400)
RBC: 4.51 10*6/uL (ref 3.70–5.45)
WBC: 6.6 10*3/uL (ref 3.9–10.3)

## 2010-03-16 LAB — COMPREHENSIVE METABOLIC PANEL
Alkaline Phosphatase: 83 U/L (ref 39–117)
BUN: 9 mg/dL (ref 6–23)
CO2: 27 mEq/L (ref 19–32)
Creatinine, Ser: 0.57 mg/dL (ref 0.40–1.20)
Glucose, Bld: 253 mg/dL — ABNORMAL HIGH (ref 70–99)
Sodium: 136 mEq/L (ref 135–145)
Total Bilirubin: 0.4 mg/dL (ref 0.3–1.2)
Total Protein: 6.5 g/dL (ref 6.0–8.3)

## 2010-03-16 LAB — MAGNESIUM: Magnesium: 1.9 mg/dL (ref 1.5–2.5)

## 2010-03-23 ENCOUNTER — Encounter: Payer: Self-pay | Admitting: Internal Medicine

## 2010-03-23 LAB — COMPREHENSIVE METABOLIC PANEL
AST: 22 U/L (ref 0–37)
Albumin: 3.6 g/dL (ref 3.5–5.2)
Alkaline Phosphatase: 81 U/L (ref 39–117)
BUN: 10 mg/dL (ref 6–23)
Glucose, Bld: 229 mg/dL — ABNORMAL HIGH (ref 70–99)
Potassium: 3.7 mEq/L (ref 3.5–5.3)
Sodium: 136 mEq/L (ref 135–145)
Total Bilirubin: 0.4 mg/dL (ref 0.3–1.2)

## 2010-03-23 LAB — CBC WITH DIFFERENTIAL/PLATELET
Basophils Absolute: 0.1 10*3/uL (ref 0.0–0.1)
Eosinophils Absolute: 0.1 10*3/uL (ref 0.0–0.5)
HCT: 33.8 % — ABNORMAL LOW (ref 34.8–46.6)
LYMPH%: 17.6 % (ref 14.0–49.7)
MCV: 71 fL — ABNORMAL LOW (ref 79.5–101.0)
MONO#: 0.6 10*3/uL (ref 0.1–0.9)
MONO%: 9.7 % (ref 0.0–14.0)
NEUT#: 4.6 10*3/uL (ref 1.5–6.5)
NEUT%: 71.1 % (ref 38.4–76.8)
Platelets: 155 10*3/uL (ref 145–400)
RBC: 4.76 10*6/uL (ref 3.70–5.45)
WBC: 6.5 10*3/uL (ref 3.9–10.3)
nRBC: 0 % (ref 0–0)

## 2010-03-28 LAB — CBC WITH DIFFERENTIAL/PLATELET
Basophils Absolute: 0 10*3/uL (ref 0.0–0.1)
EOS%: 0.7 % (ref 0.0–7.0)
Eosinophils Absolute: 0 10*3/uL (ref 0.0–0.5)
HCT: 34.2 % — ABNORMAL LOW (ref 34.8–46.6)
HGB: 11 g/dL — ABNORMAL LOW (ref 11.6–15.9)
LYMPH%: 14.5 % (ref 14.0–49.7)
MCH: 23.5 pg — ABNORMAL LOW (ref 25.1–34.0)
MCV: 73.1 fL — ABNORMAL LOW (ref 79.5–101.0)
MONO%: 10.2 % (ref 0.0–14.0)
NEUT#: 3.9 10*3/uL (ref 1.5–6.5)
NEUT%: 73.8 % (ref 38.4–76.8)
Platelets: 201 10*3/uL (ref 145–400)
RDW: 18.8 % — ABNORMAL HIGH (ref 11.2–14.5)

## 2010-03-28 LAB — COMPREHENSIVE METABOLIC PANEL
AST: 15 U/L (ref 0–37)
Albumin: 3.8 g/dL (ref 3.5–5.2)
Alkaline Phosphatase: 91 U/L (ref 39–117)
BUN: 11 mg/dL (ref 6–23)
Creatinine, Ser: 0.57 mg/dL (ref 0.40–1.20)
Glucose, Bld: 202 mg/dL — ABNORMAL HIGH (ref 70–99)
Potassium: 4.1 mEq/L (ref 3.5–5.3)

## 2010-04-05 LAB — WOUND CULTURE

## 2010-04-06 LAB — CBC WITH DIFFERENTIAL/PLATELET
Basophils Absolute: 0.1 10*3/uL (ref 0.0–0.1)
Eosinophils Absolute: 0 10*3/uL (ref 0.0–0.5)
HCT: 34.2 % — ABNORMAL LOW (ref 34.8–46.6)
HGB: 10.9 g/dL — ABNORMAL LOW (ref 11.6–15.9)
LYMPH%: 15 % (ref 14.0–49.7)
MCHC: 31.9 g/dL (ref 31.5–36.0)
MONO#: 1 10*3/uL — ABNORMAL HIGH (ref 0.1–0.9)
NEUT#: 3.8 10*3/uL (ref 1.5–6.5)
NEUT%: 65.4 % (ref 38.4–76.8)
Platelets: 186 10*3/uL (ref 145–400)
WBC: 5.8 10*3/uL (ref 3.9–10.3)

## 2010-04-06 LAB — COMPREHENSIVE METABOLIC PANEL
ALT: 17 U/L (ref 0–35)
Albumin: 2.9 g/dL — ABNORMAL LOW (ref 3.5–5.2)
Alkaline Phosphatase: 85 U/L (ref 39–117)
CO2: 28 mEq/L (ref 19–32)
Potassium: 3.7 mEq/L (ref 3.5–5.3)
Sodium: 133 mEq/L — ABNORMAL LOW (ref 135–145)
Total Bilirubin: 0.3 mg/dL (ref 0.3–1.2)
Total Protein: 6.2 g/dL (ref 6.0–8.3)

## 2010-04-13 ENCOUNTER — Ambulatory Visit: Payer: Self-pay | Admitting: Oncology

## 2010-04-13 LAB — COMPREHENSIVE METABOLIC PANEL WITH GFR
ALT: 16 U/L (ref 0–35)
AST: 18 U/L (ref 0–37)
Albumin: 2.9 g/dL — ABNORMAL LOW (ref 3.5–5.2)
Alkaline Phosphatase: 78 U/L (ref 39–117)
BUN: 7 mg/dL (ref 6–23)
CO2: 27 meq/L (ref 19–32)
Calcium: 9.2 mg/dL (ref 8.4–10.5)
Chloride: 102 meq/L (ref 96–112)
Creatinine, Ser: 0.54 mg/dL (ref 0.40–1.20)
Glucose, Bld: 258 mg/dL — ABNORMAL HIGH (ref 70–99)
Potassium: 3.5 meq/L (ref 3.5–5.3)
Sodium: 138 meq/L (ref 135–145)
Total Bilirubin: <0.1 mg/dL — ABNORMAL LOW (ref 0.3–1.2)
Total Protein: 6.3 g/dL (ref 6.0–8.3)

## 2010-04-13 LAB — CBC WITH DIFFERENTIAL/PLATELET
Basophils Absolute: 0.2 10*3/uL — ABNORMAL HIGH (ref 0.0–0.1)
Eosinophils Absolute: 0 10*3/uL (ref 0.0–0.5)
HCT: 33.4 % — ABNORMAL LOW (ref 34.8–46.6)
HGB: 10.8 g/dL — ABNORMAL LOW (ref 11.6–15.9)
LYMPH%: 10.9 % — ABNORMAL LOW (ref 14.0–49.7)
MCV: 72.8 fL — ABNORMAL LOW (ref 79.5–101.0)
MONO#: 0.9 10*3/uL (ref 0.1–0.9)
MONO%: 10.3 % (ref 0.0–14.0)
NEUT#: 6.6 10*3/uL — ABNORMAL HIGH (ref 1.5–6.5)
Platelets: 257 10*3/uL (ref 145–400)

## 2010-04-13 LAB — MAGNESIUM: Magnesium: 1.8 mg/dL (ref 1.5–2.5)

## 2010-04-20 ENCOUNTER — Ambulatory Visit (HOSPITAL_COMMUNITY): Admission: RE | Admit: 2010-04-20 | Discharge: 2010-04-20 | Payer: Self-pay | Admitting: Radiation Oncology

## 2010-05-03 ENCOUNTER — Ambulatory Visit (HOSPITAL_COMMUNITY): Admission: RE | Admit: 2010-05-03 | Discharge: 2010-05-03 | Payer: Self-pay | Admitting: Oncology

## 2010-05-03 ENCOUNTER — Ambulatory Visit: Admission: RE | Admit: 2010-05-03 | Discharge: 2010-05-03 | Payer: Self-pay | Admitting: Radiation Oncology

## 2010-05-29 ENCOUNTER — Ambulatory Visit: Payer: Self-pay | Admitting: Oncology

## 2010-05-29 ENCOUNTER — Encounter: Payer: Self-pay | Admitting: Internal Medicine

## 2010-05-29 ENCOUNTER — Telehealth: Payer: Self-pay | Admitting: Internal Medicine

## 2010-06-01 LAB — CBC WITH DIFFERENTIAL/PLATELET
Basophils Absolute: 0 10*3/uL (ref 0.0–0.1)
EOS%: 0.4 % (ref 0.0–7.0)
Eosinophils Absolute: 0 10*3/uL (ref 0.0–0.5)
HGB: 11.8 g/dL (ref 11.6–15.9)
LYMPH%: 26.1 % (ref 14.0–49.7)
MCH: 24.7 pg — ABNORMAL LOW (ref 25.1–34.0)
MCV: 77.1 fL — ABNORMAL LOW (ref 79.5–101.0)
MONO%: 7.7 % (ref 0.0–14.0)
NEUT#: 4 10*3/uL (ref 1.5–6.5)
Platelets: 229 10*3/uL (ref 145–400)
RBC: 4.79 10*6/uL (ref 3.70–5.45)
RDW: 18.7 % — ABNORMAL HIGH (ref 11.2–14.5)

## 2010-06-01 LAB — COMPREHENSIVE METABOLIC PANEL
AST: 19 U/L (ref 0–37)
Alkaline Phosphatase: 93 U/L (ref 39–117)
BUN: 13 mg/dL (ref 6–23)
Glucose, Bld: 271 mg/dL — ABNORMAL HIGH (ref 70–99)
Potassium: 3.3 mEq/L — ABNORMAL LOW (ref 3.5–5.3)
Total Bilirubin: 0.2 mg/dL — ABNORMAL LOW (ref 0.3–1.2)

## 2010-06-06 ENCOUNTER — Ambulatory Visit: Payer: Self-pay | Admitting: Internal Medicine

## 2010-06-28 ENCOUNTER — Encounter: Payer: Self-pay | Admitting: Internal Medicine

## 2010-07-09 ENCOUNTER — Encounter: Payer: Self-pay | Admitting: Internal Medicine

## 2010-07-10 ENCOUNTER — Ambulatory Visit: Payer: Self-pay | Admitting: Oncology

## 2010-07-16 ENCOUNTER — Ambulatory Visit (HOSPITAL_COMMUNITY): Admission: RE | Admit: 2010-07-16 | Discharge: 2010-07-16 | Payer: Self-pay | Admitting: Oncology

## 2010-07-18 LAB — COMPREHENSIVE METABOLIC PANEL
ALT: 12 U/L (ref 0–35)
Alkaline Phosphatase: 85 U/L (ref 39–117)
CO2: 28 mEq/L (ref 19–32)
Chloride: 94 mEq/L — ABNORMAL LOW (ref 96–112)
Glucose, Bld: 268 mg/dL — ABNORMAL HIGH (ref 70–99)
Potassium: 3.2 mEq/L — ABNORMAL LOW (ref 3.5–5.3)
Total Bilirubin: 0.2 mg/dL — ABNORMAL LOW (ref 0.3–1.2)
Total Protein: 6.1 g/dL (ref 6.0–8.3)

## 2010-07-18 LAB — CBC WITH DIFFERENTIAL/PLATELET
Basophils Absolute: 0 10*3/uL (ref 0.0–0.1)
EOS%: 1 % (ref 0.0–7.0)
Eosinophils Absolute: 0.1 10*3/uL (ref 0.0–0.5)
HCT: 34.9 % (ref 34.8–46.6)
HGB: 11.5 g/dL — ABNORMAL LOW (ref 11.6–15.9)
MONO#: 0.4 10*3/uL (ref 0.1–0.9)
NEUT#: 4.2 10*3/uL (ref 1.5–6.5)
NEUT%: 69.2 % (ref 38.4–76.8)
RDW: 13.5 % (ref 11.2–14.5)
WBC: 6 10*3/uL (ref 3.9–10.3)
lymph#: 1.3 10*3/uL (ref 0.9–3.3)

## 2010-08-06 ENCOUNTER — Ambulatory Visit: Payer: Self-pay | Admitting: Internal Medicine

## 2010-08-06 LAB — CONVERTED CEMR LAB
Blood Glucose, Fingerstick: 219
Hgb A1c MFr Bld: 8.8 %

## 2010-08-06 LAB — HM DIABETES FOOT EXAM

## 2010-08-14 ENCOUNTER — Encounter: Payer: Self-pay | Admitting: Internal Medicine

## 2010-08-30 ENCOUNTER — Ambulatory Visit (HOSPITAL_COMMUNITY)
Admission: RE | Admit: 2010-08-30 | Discharge: 2010-08-30 | Payer: Self-pay | Source: Home / Self Care | Attending: Internal Medicine | Admitting: Internal Medicine

## 2010-09-13 ENCOUNTER — Encounter: Payer: Self-pay | Admitting: Internal Medicine

## 2010-09-13 LAB — HM DIABETES EYE EXAM

## 2010-09-19 ENCOUNTER — Telehealth: Payer: Self-pay | Admitting: Internal Medicine

## 2010-09-25 ENCOUNTER — Telehealth: Payer: Self-pay | Admitting: Internal Medicine

## 2010-10-09 ENCOUNTER — Telehealth: Payer: Self-pay | Admitting: Internal Medicine

## 2010-10-12 ENCOUNTER — Other Ambulatory Visit: Payer: Self-pay | Admitting: Radiation Oncology

## 2010-10-13 ENCOUNTER — Other Ambulatory Visit: Payer: Self-pay | Admitting: Oncology

## 2010-10-13 DIAGNOSIS — C109 Malignant neoplasm of oropharynx, unspecified: Secondary | ICD-10-CM

## 2010-10-14 ENCOUNTER — Encounter: Payer: Self-pay | Admitting: Radiation Oncology

## 2010-10-25 ENCOUNTER — Encounter: Payer: Medicaid Other | Admitting: Oncology

## 2010-10-25 ENCOUNTER — Ambulatory Visit
Admission: RE | Admit: 2010-10-25 | Discharge: 2010-10-25 | Disposition: A | Discharge status: Home / Self Care | Payer: Medicaid Other | Source: Ambulatory Visit | Attending: Radiation Oncology | Admitting: Radiation Oncology

## 2010-10-25 DIAGNOSIS — C140 Malignant neoplasm of pharynx, unspecified: Secondary | ICD-10-CM | POA: Insufficient documentation

## 2010-10-25 LAB — BUN: BUN: 10 mg/dL (ref 6–23)

## 2010-10-25 NOTE — Consult Note (Signed)
Summary: [facility]  [facility]   Imported By: Lacy Duverney 08/30/2010 11:59:03  _____________________________________________________________________  External Attachment:    Type:   Image     Comment:   External Document

## 2010-10-25 NOTE — Letter (Signed)
Summary: [facility]   Imported By: Garlan Fillers 01/18/2010 12:19:09  _____________________________________________________________________  External Attachment:    Type:   Image     Comment:   External Document

## 2010-10-25 NOTE — Assessment & Plan Note (Signed)
Summary: EST-CK/FU/MEDS/CFB   Vital Signs:  Patient profile:   55 year old female Height:      62 inches (157.48 cm) Weight:      128.4 pounds (58.36 kg) BMI:     23.57 Temp:     97.9 degrees F oral Pulse rate:   103 / minute BP sitting:   166 / 78  (right arm)  Vitals Entered By: Morrison Old RN (January 10, 2010 9:00 AM)   Diabetic Foot Exam Last Podiatry Exam Date: 01/10/2010  Foot Inspection Is there a history of a foot ulcer?              No Is there a foot ulcer now?              No Can the patient see the bottom of their feet?          Yes Are the shoes appropriate in style and fit?          Yes Is there swelling or an abnormal foot shape?          No Are the toenails long?                No Are the toenails thick?                No Are the toenails ingrown?              No Is there heavy callous build-up?              No  Diabetic Foot Care Education Patient educated on appropriate care of diabetic feet.  Pulse Check          Right Foot          Left Foot Dorsalis Pedis:        normal            normal    10-g (5.07) Semmes-Weinstein Monofilament Test Performed by: Morrison Old RN          Right Foot          Left Foot Visual Inspection     normal         normal Test Control      normal         normal Site 1         normal         normal Site 2         normal         normal Site 3         normal         normal Site 4         normal         normal Site 5         normal         normal Site 6         normal         normal Site 7         normal         normal Site 8         normal         normal Site 9         normal         normal CC: Check-up. Had throat surgery last week. Is Patient Diabetic? Yes Did you bring your meter with you today? No Pain Assessment Patient in pain? no  Nutritional Status BMI of 19 -24 = normal CBG Result 228  Have you ever been in a relationship where you felt threatened, hurt or afraid?No   Does patient need  assistance? Functional Status Self care Ambulation Normal   Primary Care Provider:  Lajean Saver MD  CC:  Check-up. 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) ENLARGEMENT OF LYMPH NODES (ICD-785.6) UPPER RESPIRATORY INFECTION (ICD-465.9) LIPOMA (ICD-214.9) DIABETES MELLITUS, TYPE II (ICD-250.00) HYPERTENSION (ICD-401.9) HYPERLIPIDEMIA (ICD-272.4) PROTEINURIA (ICD-791.0) TOBACCO ABUSE (ICD-305.1) GERD (ICD-530.81) HEALTH MAINTENANCE EXAM (ICD-V70.0) MENOPAUSAL SYNDROME (ICD-627.2) GANGLION CYST, WRIST, RIGHT (ICD-727.41) ANEMIA-IRON DEFICIENCY (ICD-280.9)    Presents to OP for F/U, patient reports that 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  Also patients BP is slighty elevated, however she said that she has not taken her HCTZ today as she has ran out and would like some medication refills.    Patient currently denies SOB, Denies CP, Denies fever, chills, nausea, vomiting, diarrhea, constipation and otherwise doing well and denies any other complaints.      Depression History:      The patient denies a depressed mood most of the day and a diminished interest in her usual daily activities.         Preventive Screening-Counseling & Management  Alcohol-Tobacco     Alcohol drinks/day: 0     Smoking Status: quit < 6 months     Packs/Day: 2-3 cigs per day     Year Started: ABOUT THE AGE OF 17  Caffeine-Diet-Exercise     Does Patient Exercise: no  Current Medications (verified): 1)  Amlodipine Besylate 5 Mg  Tabs (Amlodipine Besylate) .... 2 By Mouth Every Day 2)  Lisinopril 40 Mg Tabs (Lisinopril) .... Take 1 Tablet By Mouth Once A Day 3)  Hydrochlorothiazide 25  Mg Tabs (Hydrochlorothiazide) .... Take 1 Tablet By Mouth Once A Day 4)  Zocor 40 Mg Tabs (Simvastatin) .... Take 1 Tablet By Mouth Once A Day 5)  Lantus 100 Unit/ml Soln (Insulin Glargine) .... Inject 25 Units Subcutaneously in The Mrning and 25 Units At Bedtime. 6)  Novolog Flexpen 100 Unit/ml Soln (Insulin Aspart) .... Inject 7 Units With Breakfast, 10 Units With Lunch and 8 Units With Dinner. 7)  Bd Insulin Syringe 31g X 5/16" 0.5 Ml Misc (Insulin Syringe-Needle U-100) .... To Administer Insulin Two Times A Day 8)  Lancets  Misc (Lancets) .... To Test Blood Sugar 4x/day 9)  Bd Uf Short Pen Needles 31gx5b-D .Marland Kitchen.. Use 3 Times Daily With Novolog Flexpen 10)  Aspirin 81 Mg  Tbec (Aspirin) .... One By Mouth Every Day 11)  Prodigy Autocode Blood Glucose  Strp (Glucose Blood) .... Use To Test Blood Sugar 5-6x/day Before All Meals and Bedtime and Before and After Exercise 12)  Prodigy Blood Glucose Monitor W/device Kit (Blood Glucose Monitoring Suppl) .... Use To Test Blood Sugar 5-6x/day Before All Meals and Bedtime and Before and After Exercise 13)  Amitriptyline Hcl 25 Mg Tabs (Amitriptyline Hcl) .... Take 1 Tablet By Mouth At Bedtime 14)  Metoprolol Tartrate 25 Mg Tabs (Metoprolol Tartrate) .... One Tablet By Mouth Two Times A Day 15)  Omeprazole 20 Mg Cpdr (Omeprazole) .... One By Mouth Once Daily  For Acid Reflux Symptoms  Allergies (verified): 1)  ! Pcn  Social History: Smoking Status:  quit < 6 months Does Patient Exercise:  no  Review of Systems       Per HPI  Physical Exam  General:  alert and well-developed.   Neck:  scar on L side of neck.  Lungs:  normal respiratory effort and normal breath sounds.   Heart:  normal rate, regular rhythm, and no murmur.   Abdomen:  soft, non-tender, normal bowel sounds, no distention, no guarding, no rebound tenderness, no hepatomegaly, and no splenomegaly.    Msk:  no joint swelling, no joint warmth, and no redness over joints.    Pulses:  2+  DP/PT pulses bilaterally  Extremities:  No cyanosis, clubbing, edema  Neurologic:  alert & oriented X3, cranial nerves II-XII intact, strength normal in all extremities, sensation intact to light touch, and gait normal.     Psych:  Oriented X3, memory intact for recent and remote, normally interactive, good eye contact, not anxious appearing, and not depressed appearing.    Impression & Recommendations:  Problem # 1:  DIABETES MELLITUS, TYPE II (ICD-250.00) Assessment Deteriorated patient also complains of 1 low CBG reading, however she does not have her meter today to look at her insulin regiment more closely.  Patient A1c is 9.8 today, however since she does not have her meter today, i cannot make any further adjustments in her insulin dosage. i will make f/u in 2 weeks after seeing her log and glucometer we can make further adjustment / referral to [facility] for further inputs. note that patient might benefit from meformin to lower a1c and this can be started in 2 weeks if results from bmet from today are WNL.    Her updated medication list for this problem includes:    Lisinopril 40 Mg Tabs (Lisinopril) .Marland Kitchen... Take 1 tablet by mouth once a day    Lantus 100 Unit/ml Soln (Insulin glargine) ..... Inject 25 units subcutaneously in the mrning and 25 units at bedtime.    Novolog Flexpen 100 Unit/ml Soln (Insulin aspart) ..... Inject 7 units with breakfast, 10 units with lunch and 8 units with dinner.    Aspirin 81 Mg Tbec (Aspirin) ..... One by mouth every day  Orders: T- Capillary Blood Glucose ([id number]) T-Hgb A1C (in-house) [id number]) T-Urine Microalbumin w/creat. ratio [phone])  Problem # 2:  HYPERTENSION (ICD-401.9) Assessment: Comment Only BP is slighty elevated, however she said that she has not taken her HCTZ today as she has ran out and would like some medication refills.  scripts sent to pharmacy,  Will recheck on f/u  Her updated medication list for this problem  includes:    Amlodipine Besylate 5 Mg Tabs (Amlodipine besylate) .Marland Kitchen... 2 by mouth every day    Lisinopril 40 Mg Tabs (Lisinopril) .Marland Kitchen... Take 1 tablet by mouth once a day    Hydrochlorothiazide 25 Mg Tabs (Hydrochlorothiazide) .Marland Kitchen... Take 1 tablet by mouth once a day    Metoprolol Tartrate 25 Mg Tabs (Metoprolol tartrate) ..... One tablet by mouth two times a day  Problem # 3:  HYPERLIPIDEMIA (ICD-272.4) Assessment: Comment Only Well controlled on current treatment, No new changes made today, Will continue to monitor.  will check FLP and LFT today.   Her updated medication list for this problem includes:    Zocor 40 Mg Tabs (Simvastatin) .Marland Kitchen... 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 2 by mouth every day 2)  Lisinopril 40 Mg Tabs (Lisinopril) .... Take 1 tablet by mouth once a day 3)  Hydrochlorothiazide 25 Mg Tabs (Hydrochlorothiazide) .... Take 1 tablet by mouth once a day 4)  Zocor 40 Mg Tabs (Simvastatin) .... Take 1 tablet by mouth once a day 5)  Lantus 100 Unit/ml Soln (Insulin glargine) .... Inject 25 units subcutaneously in the mrning and 25 units at bedtime. 6)  Novolog Flexpen 100 Unit/ml Soln (Insulin aspart) .... Inject 7 units with breakfast, 10 units with lunch and 8 units with dinner. 7)  Bd Insulin Syringe 31g X 5/16" 0.5 Ml Misc (Insulin syringe-needle u-100) .... To administer insulin two times a day 8)  Lancets Misc (Lancets) ....  To test blood sugar 4x/day 9)  Bd Uf Short Pen Needles 31gx5b-d  .... Use 3 times daily with novolog flexpen 10)  Aspirin 81 Mg Tbec (Aspirin) .... One by mouth every day 11)  Prodigy Autocode Blood Glucose Strp (Glucose blood) .... Use to test blood sugar 5-6x/day before all meals and bedtime and before and after exercise 12)  Prodigy Blood Glucose Monitor W/device Kit (Blood glucose monitoring suppl) .... Use to test blood sugar 5-6x/day before all meals and bedtime and before and after exercise 13)  Amitriptyline Hcl 25 Mg Tabs (Amitriptyline hcl) .... Take 1 tablet by mouth at bedtime 14)  Metoprolol Tartrate 25 Mg Tabs (Metoprolol tartrate) .... One tablet by mouth two times a day 15)  Omeprazole 20 Mg Cpdr (Omeprazole) .... One by mouth once daily for acid reflux symptoms  Other Orders: T-Comprehensive Metabolic Panel ([id number])  Patient Instructions: 1)  Please schedule a follow-up appointment in 2 weeks. 2)  Please bring your glucometer and log with you please on next visit.  Prescriptions: METOPROLOL TARTRATE 25 MG TABS (METOPROLOL TARTRATE) one tablet by mouth two times a day  #30 x 5   Entered and Authorized by:   Vertell Limber MD   Signed by:   Vertell Limber MD on 01/10/2010   Method used:   Electronically to        [facility] [phone]* (retail)       [place], [place]  [id number]       Ph: [phone] or [phone]       Fax: [phone]   RxID:   [id number] AMLODIPINE BESYLATE 5 MG  TABS (AMLODIPINE BESYLATE) 2 by mouth every day  #30 x 5   Entered and Authorized by:   Vertell Limber MD   Signed by:   Vertell Limber MD on 01/10/2010   Method used:   Electronically to        [facility] [phone]* (retail)       [place], [place]  [id number]       Ph: [phone] or [phone]       Fax: [phone]   RxID:  [id number] HYDROCHLOROTHIAZIDE  25 MG TABS (HYDROCHLOROTHIAZIDE) Take 1 tablet by mouth once a day  #62 x 5   Entered and Authorized by:   Vertell Limber MD   Signed by:   Vertell Limber MD on 01/10/2010   Method used:   Electronically to        [facility] [phone]* (retail)       [place], [place]  [id number]       Ph: [phone] or [phone]       Fax: [phone]   RxID:   [id number]   Prevention & Chronic Care Immunizations   Influenza vaccine: Fluvax 3+  (06/22/2009)   Influenza vaccine deferral: Deferred  (01/10/2010)    Tetanus booster: Not documented   Td booster deferral: Deferred  (07/27/2009)    Pneumococcal vaccine: Not documented  Colorectal Screening   Hemoccult: Not documented   Hemoccult action/deferral: Not indicated  (07/27/2009)    Colonoscopy:  Results: Normal, but limited by prep Location:  [place].    (07/25/2006)   Colonoscopy action/deferral: Per Dr. Carlean Purl: "would not do routine hemoccult cards for 5 years. Given normal colnoscopy with good prep in 2005, would not work this up further, but defer to Dr. Jerilee Hoh if there is other relevant clinical info that indicates otherwise."  Given this, I will set the due date 09/2013 as 10 years from last normal colonoscopy with good prep  (07/25/2006)   Colonoscopy due: 09/2013  Other Screening   Pap smear:  Specimen Adequacy: Satisfactory for evaluation.   Interpretation/Result:Negative for intraepithelial Lesion or Malignancy.     (02/02/2008)   Pap smear action/deferral: Deferred  (07/27/2009)   Pap smear due: 01/2009    Mammogram: No specific mammographic evidence of malignancy.  Assessment: BIRADS 1.   (03/06/2009)   Mammogram due: 02/2010   Smoking status: quit < 6 months  (01/10/2010)  Diabetes Mellitus   HgbA1C: 9.8  (01/10/2010)    Eye exam: No diabetic retinopathy.     (02/21/2009)   Eye exam due: 02/2010    Foot exam: Not documented    Foot exam action/deferral: Do today   High risk foot: Not documented   Foot care education: Done  (01/10/2010)    Urine microalbumin/creatinine ratio: 213.4  (03/15/2008)   Urine microalbumin action/deferral: Ordered    Diabetes flowsheet reviewed?: Yes   Progress toward A1C goal: Unchanged  Lipids   Total Cholesterol: 122  (11/24/2008)   Lipid panel action/deferral: Lipid Panel ordered   LDL: 66  (11/24/2008)   LDL Direct: Not documented   HDL: 34  (11/24/2008)   Triglycerides: 112  (11/24/2008)    SGOT (AST): 19  (11/24/2008)   BMP action: Ordered   SGPT (ALT): 24  (11/24/2008) CMP ordered    Alkaline phosphatase: 86  (11/24/2008)   Total bilirubin: 0.3  (11/24/2008)    Lipid flowsheet reviewed?: Yes   Progress toward LDL goal: At goal  Hypertension   Last Blood Pressure: 166 / 78  (01/10/2010)   Serum creatinine: 0.63  (11/24/2008)   BMP action: Ordered   Serum potassium 3.5  (11/24/2008) CMP ordered     Hypertension flowsheet reviewed?: Yes   Progress toward BP goal: Unchanged  Self-Management Support :   Personal Goals (by the next clinic visit) :     Personal A1C goal: 7  (07/27/2009)     Personal blood pressure goal:  130/80  (07/27/2009)     Personal LDL goal: 70  (07/27/2009)    Patient will work on the following items until the next clinic visit to reach self-care goals:     Medications and monitoring: check my blood sugar, examine my feet every day  (01/10/2010)     Eating: eat more vegetables, use fresh or frozen vegetables, eat foods that are low in salt, eat baked foods instead of fried foods, eat fruit for snacks and desserts  (01/10/2010)     Activity: take a 30 minute walk every day  (01/10/2010)    Diabetes self-management support: Education handout, Resources for patients handout, Written self-care plan  (01/10/2010)   Diabetes care plan printed   Diabetes education handout printed   Last diabetes self-management training by diabetes educator:  03/13/2009   Last medical nutrition therapy: 11/24/2008    Hypertension self-management support: Education handout, Resources for patients handout, Written self-care plan  (01/10/2010)   Hypertension self-care plan printed.   Hypertension education handout printed    Lipid self-management support: Education handout, Resources for patients handout, Written self-care plan  (01/10/2010)   Lipid self-care plan printed.   Lipid education handout printed      Resource handout printed.   Nursing Instructions: Diabetic foot exam today   Process Orders Check Orders Results:     [facility]: [id number] not required for this insurance Tests Sent for requisitioning (January 10, 2010 11:25 AM):     01/10/2010: [facility] -- T-Urine Microalbumin w/creat. ratio [[id number]] (signed)     01/10/2010: [facility] -- T-Lipid Profile [phone] (signed)     01/10/2010: [facility] -- T-Comprehensive Metabolic Panel [id number] (signed)    Laboratory Results   Blood Tests   Date/Time Received: January 10, 2010 9:15 AM  Date/Time Reported: Lenoria Farrier  January 10, 2010 9:15 AM   HGBA1C: 9.8%   (Normal Range: Non-Diabetic - 3-6%   Control Diabetic - 6-8%) CBG Random:: 228mg /dL

## 2010-10-25 NOTE — Miscellaneous (Signed)
  per ENT report by dr. [name] patient has Carcinoma of oropharynx>> may get both chemo+ radiation.Susan Evans

## 2010-10-25 NOTE — Progress Notes (Signed)
Summary: Refill/gh  Phone Note Refill Request Message from:  Fax from Pharmacy on Feb 12, 2010 10:07 AM  Refills Requested: Medication #1:  ZOCOR 40 MG TABS Take 1 tablet by mouth once a day   Last Refilled: 01/07/2010  Method Requested: Electronic Initial call taken by: Sander Nephew RN,  Feb 12, 2010 10:07 AM  Follow-up for Phone Call       Follow-up by: Vertell Limber MD,  Feb 13, 2010 12:13 PM  Additional Follow-up for Phone Call Additional follow up Details #1::       Additional Follow-up by: Sander Nephew RN,  Feb 13, 2010 1:53 PM    Prescriptions: ZOCOR 40 MG TABS (SIMVASTATIN) Take 1 tablet by mouth once a day  #30 x 12   Entered and Authorized by:   Vertell Limber MD   Signed by:   Vertell Limber MD on 02/13/2010   Method used:   Electronically to        [facility] [phone]* (retail)       [place], [place]  [id number]       Ph: [phone] or [phone]       Fax: [phone]   RxID:   [id number]

## 2010-10-25 NOTE — Miscellaneous (Signed)
Summary: Orders Update  Clinical Lists Changes  Orders: Added new Test order of T-Basic Metabolic Panel ([id number]) - Signed  

## 2010-10-25 NOTE — Progress Notes (Signed)
Summary: med refill/gp  Phone Note Refill Request Message from:  Fax from Pharmacy on October 02, 2009 5:16 PM  Refills Requested: Medication #1:  LANCETS  MISC to test blood sugar 4x/day   Dosage confirmed as above?Dosage Confirmed   Supply Requested: 1 year Prodigy twist top 28G   Method Requested: Electronic Initial call taken by: Morrison Old RN,  October 02, 2009 5:17 PM    Prescriptions: LANCETS  MISC (LANCETS) to test blood sugar 4x/day  #120 x 11   Entered and Authorized by:   Oval Linsey MD   Signed by:   Oval Linsey MD on 10/02/2009   Method used:   Electronically to        [facility] [phone]* (retail)       [place], [place]  [id number]       Ph: [phone] or [phone]       Fax: [phone]   RxID:   [id number]

## 2010-10-25 NOTE — Assessment & Plan Note (Signed)
Summary: EST-CK/FU/MEDS/CFB   Vital Signs:  Patient profile:   55 year old female Height:      62 inches (157.48 cm) Weight:      112.9 pounds (51.32 kg) BMI:     20.72 Temp:     98.5 degrees F (36.94 degrees C) oral Pulse rate:   87 / minute BP sitting:   140 / 78  (right arm)  Vitals Entered By: Hilda Blades Ditzler RN (August 06, 2010 2:36 PM) CC: diabetes Is Patient Diabetic? Yes Did you bring your meter with you today? Yes Pain Assessment Patient in pain? no      Nutritional Status BMI of 19 -24 = normal Nutritional Status Detail appetite good CBG Result 219  Have you ever been in a relationship where you felt threatened, hurt or afraid?denies   Does patient need assistance? Functional Status Self care Ambulation Normal Comments Ck-up.   Diabetic Foot Exam Foot Inspection Is there a history of a foot ulcer?              No Is there a foot ulcer now?              No Can the patient see the bottom of their feet?          Yes Are the shoes appropriate in style and fit?          Yes Is there swelling or an abnormal foot shape?          No Are the toenails long?                No Are the toenails thick?                Yes Are the toenails ingrown?              No Is there heavy callous build-up?              No Is there pain in the calf muscle (Intermittent claudication) when walking?    NoIs there a claw toe deformity?              No Is there elevated skin temperature?            No Is there limited ankle dorsiflexion?            No Is there foot or ankle muscle weakness?            No  Diabetic Foot Care Education  High Risk Feet? Yes   10-g (5.07) Semmes-Weinstein Monofilament Test Performed by: Hilda Blades Ditzler RN          Right Foot          Left Foot Visual Inspection               Test Control      normal         normal Site 1         normal         normal Site 2         normal         normal Site 3         normal         normal Site 4         normal          normal Site 5         normal         normal Site  6         normal         normal Site 7         normal         normal Site 8         normal         normal Site 9         normal         normal Site 10         normal         normal  Impression      normal         normal   Primary Care Provider:  Lajean Saver MD  CC:  diabetes.  History of Present Illness: 55 yo f with Past Medical History per EMR Presents to [facility] for routine followup and diabetic monitoring., pt denies any new complaints and states that she is fully compliant with all her meds.  for her DM her sugars have been running in the 200 throughout the day without any lows.   Patient currently denies SOB, Denies CP, Denies fever, chills, nausea, vomiting, diarrhea, constipation and otherwise doing well and denies any other complaints.      Depression History:      The patient denies a depressed mood most of the day and a diminished interest in her usual daily activities.         Preventive Screening-Counseling & Management  Alcohol-Tobacco     Alcohol drinks/day: 0     Smoking Status: quit < 6 months     Smoking Cessation Counseling: yes     Packs/Day: 2-3 cigs per day     Year Started: ABOUT THE AGE 55 OF 17  Caffeine-Diet-Exercise     Does Patient Exercise: no     Type of exercise: WALKING     Times/week: 1  Current Medications (verified): 1)  Amlodipine Besylate 5 Mg  Tabs (Amlodipine Besylate) .... 2 By Mouth Every Day 2)  Lisinopril 40 Mg Tabs (Lisinopril) .... Take 1 Tablet By Mouth Once A Day 3)  Hydrochlorothiazide 25 Mg Tabs (Hydrochlorothiazide) .... Take 1 Tablet By Mouth Once A Day 4)  Simvastatin 20 Mg Tabs (Simvastatin) .... Take 1 Tablet By Mouth Once A Day 5)  Lantus 100 Unit/ml Soln (Insulin Glargine) .... Inject 28 Units Subcutaneously in The Morning and 25 Units At Bedtime. 6)  Novolog Flexpen 100 Unit/ml Soln (Insulin Aspart) .... Inject 5 Units With Breakfast, 10 Units With Lunch and 8 Units  With Dinner. 7)  Bd Insulin Syringe 31g X 5/16" 0.5 Ml Misc (Insulin Syringe-Needle U-100) .... To Administer Insulin Two Times A Day 8)  Lancets  Misc (Lancets) .... To Test Blood Sugar 4x/day 9)  Bd Uf Short Pen Needles 31gx5b-D .Marland Kitchen.. Use 3 Times Daily With Novolog Flexpen 10)  Aspirin 81 Mg  Tbec (Aspirin) .... One By Mouth Every Day 11)  Prodigy Autocode Blood Glucose  Strp (Glucose Blood) .... Use To Test Blood Sugar 5-6x/day Before All Meals and Bedtime and Before and After Exercise 12)  Prodigy Blood Glucose Monitor W/device Kit (Blood Glucose Monitoring Suppl) .... Use To Test Blood Sugar 5-6x/day Before All Meals and Bedtime and Before and After Exercise 13)  Amitriptyline Hcl 25 Mg Tabs (Amitriptyline Hcl) .... Take 1 Tablet By Mouth At Bedtime 14)  Metoprolol Tartrate 25 Mg Tabs (Metoprolol Tartrate) .... One Tablet By Mouth Two Times A Day 15)  Omeprazole 20 Mg Cpdr (Omeprazole) .Marland KitchenMarland KitchenMarland Kitchen  One By Mouth Once Daily For Acid Reflux Symptoms  Allergies: 1)  ! Pcn  Review of Systems       negative except per HPI  Physical Exam  General:  alert and well-developed.   Neck:  scar on L side of neck.  Lungs:  normal respiratory effort and normal breath sounds.   Heart:  normal rate, regular rhythm, and no murmur.   Abdomen:  soft, non-tender, normal bowel sounds, no distention, no guarding, no rebound tenderness, no hepatomegaly, and no splenomegaly.    Msk:  no joint swelling, no joint warmth, and no redness over joints.    Pulses:  2+ DP/PT pulses bilaterally  Extremities:  No cyanosis, clubbing, edema  Neurologic:  alert & oriented X3, cranial nerves II-XII intact, strength normal in all extremities, sensation intact to light touch, and gait normal.      Diabetes Management Exam:    Foot Exam (with socks and/or shoes not present):       Sensory-Monofilament:          Left foot: normal          Right foot: normal   Impression & Recommendations:  Problem # 1:  DIABETES MELLITUS,  TYPE II (ICD-250.00) Assessment Improved aic improved, however we  are still not at goal, will increase lantus to 28units in am, and decresase an novolog to 5 units.  will follow.   Her updated medication list for this problem includes:    Lisinopril 40 Mg Tabs (Lisinopril) .Marland Kitchen... Take 1 tablet by mouth once a day    Lantus 100 Unit/ml Soln (Insulin glargine) ..... Inject 28 units subcutaneously in the morning and 25 units at bedtime.    Novolog Flexpen 100 Unit/ml Soln (Insulin aspart) ..... Inject 5 units with breakfast, 10 units with lunch and 8 units with dinner.    Aspirin 81 Mg Tbec (Aspirin) ..... One by mouth every day  Orders: T- Capillary Blood Glucose (82948) T-Hgb A1C (in-house) [id number]) Ophthalmology Referral (Ophthalmology)  Problem # 2:  HYPERTENSION (ICD-401.9) Assessment: Comment Only Well controlled on current treatment, No new changes made today, Will continue to monitor.   Her updated medication list for this problem includes:    Amlodipine Besylate 5 Mg Tabs (Amlodipine besylate) .Marland Kitchen... 2 by mouth every day    Lisinopril 40 Mg Tabs (Lisinopril) .Marland Kitchen... Take 1 tablet by mouth once a day    Hydrochlorothiazide 25 Mg Tabs (Hydrochlorothiazide) .Marland Kitchen... Take 1 tablet by mouth once a day    Metoprolol Tartrate 25 Mg Tabs (Metoprolol tartrate) ..... One tablet by mouth two times a day  Problem # 3:  HYPERLIPIDEMIA (ICD-272.4) Assessment: Comment Only Well controlled on current treatment, No new changes made today, Will continue to monitor.   Her updated medication list for this problem includes:    Simvastatin 20 Mg Tabs (Simvastatin) .Marland Kitchen... Take 1 tablet by mouth once a day  Labs Reviewed: SGOT: 24 (01/10/2010)   SGPT: 29 (01/10/2010)   HDL:30 (01/10/2010), 34 (11/24/2008)  LDL:34 (01/10/2010), 66 (11/24/2008)  Chol:98 (01/10/2010), 122 (11/24/2008)  Trig:171 (01/10/2010), 112 (11/24/2008)  Problem # 4:  TOBACCO ABUSE (ICD-305.1) Assessment: Comment Only quit 1 year  ago.  Complete Medication List: 1)  Amlodipine Besylate 5 Mg Tabs (Amlodipine besylate) .... 2 by mouth every day 2)  Lisinopril 40 Mg Tabs (Lisinopril) .... Take 1 tablet by mouth once a day 3)  Hydrochlorothiazide 25 Mg Tabs (Hydrochlorothiazide) .... Take 1 tablet by mouth once a day 4)  Simvastatin 20  Mg Tabs (Simvastatin) .... Take 1 tablet by mouth once a day 5)  Lantus 100 Unit/ml Soln (Insulin glargine) .... Inject 28 units subcutaneously in the morning and 25 units at bedtime. 6)  Novolog Flexpen 100 Unit/ml Soln (Insulin aspart) .... Inject 5 units with breakfast, 10 units with lunch and 8 units with dinner. 7)  Bd Insulin Syringe 31g X 5/16" 0.5 Ml Misc (Insulin syringe-needle u-100) .... To administer insulin two times a day 8)  Lancets Misc (Lancets) .... To test blood sugar 4x/day 9)  Bd Uf Short Pen Needles 31gx5b-d  .... Use 3 times daily with novolog flexpen 10)  Aspirin 81 Mg Tbec (Aspirin) .... One by mouth every day 11)  Prodigy Autocode Blood Glucose Strp (Glucose blood) .... Use to test blood sugar 5-6x/day before all meals and bedtime and before and after exercise 12)  Prodigy Blood Glucose Monitor W/device Kit (Blood glucose monitoring suppl) .... Use to test blood sugar 5-6x/day before all meals and bedtime and before and after exercise 13)  Amitriptyline Hcl 25 Mg Tabs (Amitriptyline hcl) .... Take 1 tablet by mouth at bedtime 14)  Metoprolol Tartrate 25 Mg Tabs (Metoprolol tartrate) .... One tablet by mouth two times a day 15)  Omeprazole 20 Mg Cpdr (Omeprazole) .... One by mouth once daily for acid reflux symptoms  Patient Instructions: 1)  please increase your lantus to 28units in the morning, and reduce your norning novolg to 5 units.  2)  Please schedule a follow-up appointment in 3 months.   Orders Added: 1)  T- Capillary Blood Glucose [82948] 2)  T-Hgb A1C (in-house) [id number] 3)  Ophthalmology Referral [Ophthalmology] 4)  Est. Patient Level IV  [id number]     Prevention & Chronic Care Immunizations   Influenza vaccine: Fluvax Non-MCR  (06/06/2010)   Influenza vaccine deferral: Deferred  (01/10/2010)    Tetanus booster: Not documented   Td booster deferral: Deferred  (07/27/2009)    Pneumococcal vaccine: Not documented  Colorectal Screening   Hemoccult: Not documented   Hemoccult action/deferral: Not indicated  (07/27/2009)    Colonoscopy:  Results: Normal, but limited by prep Location:  [facility].    (07/25/2006)   Colonoscopy action/deferral: Per Dr. Carlean Purl: "would not do routine hemoccult cards for 5 years. Given normal colnoscopy with good prep in 2005, would not work this up further, but defer to Dr. Jerilee Hoh if there is other relevant clinical info that indicates otherwise."  Given this, I will set the due date 09/2013 as 10 years from last normal colonoscopy with good prep  (07/25/2006)   Colonoscopy due: 09/2013  Other Screening   Pap smear:  Specimen Adequacy: Satisfactory for evaluation.   Interpretation/Result:Negative for intraepithelial Lesion or Malignancy.     (02/02/2008)   Pap smear action/deferral: Deferred  (07/27/2009)   Pap smear due: 01/2009    Mammogram: No specific mammographic evidence of malignancy.  Assessment: BIRADS 1.   (03/06/2009)   Mammogram due: 02/2010   Smoking status: quit < 6 months  (08/06/2010)  Diabetes Mellitus   HgbA1C: 8.8  (08/06/2010)    Eye exam: No diabetic retinopathy.     (02/21/2009)   Diabetic eye exam action/deferral: Ophthalmology referral  (08/06/2010)   Eye exam due: 02/2010    Foot exam: yes  (08/06/2010)   Foot exam action/deferral: Do today   High risk foot: Yes  (08/06/2010)   Foot care education: Done  (01/10/2010)    Urine microalbumin/creatinine ratio: 344.3  (01/10/2010)   Urine microalbumin action/deferral:  Ordered    Diabetes flowsheet reviewed?: Yes   Progress toward A1C goal: Improved  Lipids   Total Cholesterol: 98   (01/10/2010)   Lipid panel action/deferral: Lipid Panel ordered   LDL: 34  (01/10/2010)   LDL Direct: Not documented   HDL: 30  (01/10/2010)   Triglycerides: 171  (01/10/2010)    SGOT (AST): 24  (01/10/2010)   BMP action: Ordered   SGPT (ALT): 29  (01/10/2010)   Alkaline phosphatase: 81  (01/10/2010)   Total bilirubin: 0.2  (01/10/2010)    Lipid flowsheet reviewed?: Yes   Progress toward LDL goal: At goal  Hypertension   Last Blood Pressure: 140 / 78  (08/06/2010)   Serum creatinine: 0.52  (01/10/2010)   BMP action: Ordered   Serum potassium 3.8  (01/10/2010)    Hypertension flowsheet reviewed?: Yes   Progress toward BP goal: At goal  Self-Management Support :   Personal Goals (by the next clinic visit) :     Personal A1C goal: 7  (07/27/2009)     Personal blood pressure goal: 130/80  (07/27/2009)     Personal LDL goal: 70  (07/27/2009)    Patient will work on the following items until the next clinic visit to reach self-care goals:     Medications and monitoring: take my medicines every day, check my blood sugar, check my blood pressure, bring all of my medications to every visit, examine my feet every day  (08/06/2010)     Eating: drink diet soda or water instead of juice or soda, eat more vegetables, use fresh or frozen vegetables, eat foods that are low in salt, eat baked foods instead of fried foods, eat fruit for snacks and desserts, limit or avoid alcohol  (08/06/2010)     Activity: take a 30 minute walk every day  (08/06/2010)    Diabetes self-management support: Written self-care plan, Education handout, Resources for patients handout  (08/06/2010)   Diabetes care plan printed   Diabetes education handout printed   Last diabetes self-management training by diabetes educator: 03/13/2009   Last medical nutrition therapy: 11/24/2008    Hypertension self-management support: Written self-care plan, Education handout, Resources for patients handout  (08/06/2010)    Hypertension self-care plan printed.   Hypertension education handout printed    Lipid self-management support: Written self-care plan, Education handout, Resources for patients handout  (08/06/2010)   Lipid self-care plan printed.   Lipid education handout printed      Resource handout printed.   Nursing Instructions: Refer for screening diabetic eye exam (see order) Diabetic foot exam today    Laboratory Results   Blood Tests   Date/Time Received: August 06, 2010 2:51 PM  Date/Time Reported: Lenoria Farrier  August 06, 2010 2:51 PM   HGBA1C: 8.8%   (Normal Range: Non-Diabetic - 3-6%   Control Diabetic - 6-8%) CBG Random:: 219mg /dL       Last LDL:                                                 34 (01/10/2010 9:40:00 PM)        Diabetic Foot Exam Last Podiatry Exam Date: 01/10/2010  High Risk Feet? Yes   10-g (5.07) Semmes-Weinstein Monofilament Test Performed by: Hilda Blades Ditzler RN          Right Foot  Left Foot Visual Inspection

## 2010-10-25 NOTE — Letter (Signed)
Summary: [facility]   Imported By: Garlan Fillers 01/02/2010 11:12:25  _____________________________________________________________________  External Attachment:    Type:   Image     Comment:   External Document

## 2010-10-25 NOTE — Progress Notes (Signed)
Summary: refill/gg  Phone Note Refill Request  on October 09, 2010 3:17 PM  Refills Requested: Medication #1:  METOPROLOL TARTRATE 25 MG TABS one tablet by mouth two times a day   Last Refilled: 07/23/2010  Method Requested: Electronic Initial call taken by: Gevena Cotton RN,  October 09, 2010 3:17 PM    Prescriptions: METOPROLOL TARTRATE 25 MG TABS (METOPROLOL TARTRATE) one tablet by mouth two times a day  #30 x 5   Entered and Authorized by:   Vertell Limber MD   Signed by:   Vertell Limber MD on 10/09/2010   Method used:   Electronically to        [facility] [phone]* (retail)       [place], [place]  [id number]       Ph: [phone] or [phone]       Fax: [phone]   RxID:   [id number]

## 2010-10-25 NOTE — Progress Notes (Signed)
Summary: refill/gg  Phone Note Refill Request  on October 24, 2009 11:26 AM  Refills Requested: Medication #1:  PRODIGY AUTOCODE BLOOD GLUCOSE  STRP Use to test blood sugar 5-6x/day before all meals and bedtime and before and after exercise   Last Refilled: 09/14/2009  Method Requested: Electronic Initial call taken by: Gevena Cotton RN,  October 24, 2009 11:27 AM  Follow-up for Phone Call        What quantity are they requesting? Follow-up by: Bertha Stakes MD,  October 24, 2009 11:51 AM  Additional Follow-up for Phone Call Additional follow up Details #1::         Use to test blood sugar 5-6x/day before all meals and bedtime and before and after exercise- completed in prior note. Additional Follow-up by: Rhea Pink  DO,  October 24, 2009 4:47 PM

## 2010-10-25 NOTE — Letter (Signed)
Summary: [facility]   Imported By: Garlan Fillers 02/20/2010 11:13:58  _____________________________________________________________________  External Attachment:    Type:   Image     Comment:   External Document

## 2010-10-25 NOTE — Consult Note (Signed)
Summary: [facility]  [facility]   Imported By: Lacy Duverney 04/05/2010 11:45:11  _____________________________________________________________________  External Attachment:    Type:   Image     Comment:   External Document

## 2010-10-25 NOTE — Progress Notes (Signed)
Summary: med refill/gp  Phone Note Refill Request Message from:  Patient on December 11, 2009 10:03 AM  Refills Requested: Medication #1:  HYDROCHLOROTHIAZIDE 25 MG TABS Take 1 tablet by mouth once a day Last appt. 10/19/09.   Method Requested: Electronic Initial call taken by: Morrison Old RN,  December 11, 2009 10:03 AM  Follow-up for Phone Call       Follow-up by: Larey Dresser MD,  December 11, 2009 10:20 AM    Prescriptions: HYDROCHLOROTHIAZIDE 25 MG TABS (HYDROCHLOROTHIAZIDE) Take 1 tablet by mouth once a day  #62 x 5   Entered and Authorized by:   Larey Dresser MD   Signed by:   Larey Dresser MD on 12/11/2009   Method used:   Electronically to        [facility] [phone]* (retail)       [place], [place]  [id number]       Ph: [phone] or [phone]       Fax: [phone]   RxID:   [id number]

## 2010-10-25 NOTE — Progress Notes (Signed)
Summary: refill/gg  Phone Note Refill Request  on October 25, 2009 9:44 AM  Refills Requested: Medication #1:  PRODIGY AUTOCODE BLOOD GLUCOSE  STRP Use to test blood sugar 5-6x/day before all meals and bedtime and before and after exercise   Last Refilled: 09/20/2008 THis has been faxed   Method Requested: Fax to [facility] Initial call taken by: Gevena Cotton RN,  October 25, 2009 9:44 AM  Follow-up for Phone Call        Refill approved-nurse to complete    Prescriptions: PRODIGY AUTOCODE BLOOD GLUCOSE  STRP (GLUCOSE BLOOD) Use to test blood sugar 5-6x/day before all meals and bedtime and before and after exercise  #150 x 6   Entered and Authorized by:   Rhea Pink  DO   Signed by:   Rhea Pink  DO on 10/26/2009   Method used:   Electronically to        [facility] [phone]* (retail)       [place], [place]  [id number]       Ph: [phone] or [phone]       Fax: [phone]   RxID:   [id number]

## 2010-10-25 NOTE — Progress Notes (Signed)
Summary: Refill/gh  Phone Note Refill Request Message from:  Fax from Pharmacy on November 24, 2009 3:35 PM  Refills Requested: Medication #1:  AMLODIPINE BESYLATE 5 MG  TABS 2 by mouth every day   Last Refilled: 11/01/2009 Pt for some reason per pharmacy got 2 refills in January.  Last office visit was 10/19/2009 and labs were done 11/24/2008.   Method Requested: Electronic Initial call taken by: Sander Nephew RN,  November 24, 2009 3:36 PM  Follow-up for Phone Call       Follow-up by: Larey Dresser MD,  November 24, 2009 3:51 PM    Prescriptions: AMLODIPINE BESYLATE 5 MG  TABS (AMLODIPINE BESYLATE) 2 by mouth every day  #30 x 5   Entered and Authorized by:   Larey Dresser MD   Signed by:   Larey Dresser MD on 11/24/2009   Method used:   Electronically to        [facility] [phone]* (retail)       [place], [place]  [id number]       Ph: [phone] or [phone]       Fax: [phone]   RxID:   [id number]

## 2010-10-25 NOTE — Consult Note (Signed)
Summary: [facility]  [facility]   Imported By: Lacy Duverney 04/30/2010 10:40:16  _____________________________________________________________________  External Attachment:    Type:   Image     Comment:   External Document

## 2010-10-25 NOTE — Miscellaneous (Signed)
  Clinical Lists Changes  Medications: Changed medication from ZOCOR 40 MG TABS (SIMVASTATIN) Take 1 tablet by mouth once a day to SIMVASTATIN 20 MG TABS (SIMVASTATIN) Take 1 tablet by mouth once a day - Signed Rx of SIMVASTATIN 20 MG TABS (SIMVASTATIN) Take 1 tablet by mouth once a day;  #30 x 6;  Signed;  Entered by: Vertell Limber MD;  Authorized by: Vertell Limber MD;  Method used: Electronically to [facility] [phone]*, [address], [place], [place], [place]  [id number], Ph: [phone] or [phone], Fax: [phone]    Prescriptions: SIMVASTATIN 20 MG TABS (SIMVASTATIN) Take 1 tablet by mouth once a day  #30 x 6   Entered and Authorized by:   Vertell Limber MD   Signed by:   Vertell Limber MD on 07/09/2010   Method used:   Electronically to        [facility] [phone]* (retail)       [place], [place]  [id number]       Ph: [phone] or [phone]       Fax: [phone]   RxID:   [id number]

## 2010-10-25 NOTE — Consult Note (Signed)
Summary: [facility]   Imported By: Bonner Puna 11/09/2009 08:50:03  _____________________________________________________________________  External Attachment:    Type:   Image     Comment:   External Document

## 2010-10-25 NOTE — Letter (Signed)
Summary: [facility]   Imported By: Garlan Fillers 12/28/2009 09:53:49  _____________________________________________________________________  External Attachment:    Type:   Image     Comment:   External Document

## 2010-10-25 NOTE — Consult Note (Signed)
Summary: [facility]  [facility]   Imported By: Bonner Puna 11/29/2009 14:55:50  _____________________________________________________________________  External Attachment:    Type:   Image     Comment:   External Document

## 2010-10-25 NOTE — Progress Notes (Signed)
Summary: med refill/gp  Phone Note Refill Request Message from:  Fax from Pharmacy on Feb 20, 2010 10:19 AM  Refills Requested: Medication #1:  LANTUS 100 UNIT/ML SOLN Inject 25 units subcutaneously in the mrning and 25 units at bedtime.   Last Refilled: 01/23/2010 last OV 01/10/10.   Method Requested: Electronic Initial call taken by: Morrison Old RN,  Feb 20, 2010 10:19 AM  Follow-up for Phone Call       Follow-up by: Vertell Limber MD,  Feb 20, 2010 1:17 PM    Prescriptions: LANTUS 100 UNIT/ML SOLN (INSULIN GLARGINE) Inject 25 units subcutaneously in the mrning and 25 units at bedtime.  #1 month x 12   Entered and Authorized by:   Vertell Limber MD   Signed by:   Vertell Limber MD on 02/20/2010   Method used:   Electronically to        [facility] [phone]* (retail)       [place], [place]  [id number]       Ph: [phone] or [phone]       Fax: [phone]   RxID:   [id number]

## 2010-10-25 NOTE — Progress Notes (Signed)
Summary: refill/gg  Phone Note Refill Request  on September 25, 2010 3:36 PM  Pt needs new monitor and strips.  has  medicaid so please change to  accu-chek aviva ( needs coding )   Method Requested: Electronic Initial call taken by: Gevena Cotton RN,  September 25, 2010 3:39 PM    New/Updated Medications: ACCU-CHEK AVIVA  KIT (BLOOD GLUCOSE MONITORING SUPPL) use as instructed ACCU-CHEK AVIVA  STRP (GLUCOSE BLOOD) use as instructed Prescriptions: ACCU-CHEK AVIVA  STRP (GLUCOSE BLOOD) use as instructed  #90 x 5   Entered and Authorized by:   Vertell Limber MD   Signed by:   Vertell Limber MD on 09/26/2010   Method used:   Electronically to        [facility] [phone]* (retail)       [place], [place]  [id number]       Ph: [phone] or [phone]       Fax: [phone]   RxID:   [id number] ACCU-CHEK AVIVA  KIT (BLOOD GLUCOSE MONITORING SUPPL) use as instructed  #1 x 0   Entered and Authorized by:   Vertell Limber MD   Signed by:   Vertell Limber MD on 09/26/2010   Method used:   Electronically to        [facility] [phone]* (retail)       [place], [place]  [id number]       Ph: [phone] or [phone]       Fax: [phone]   RxID:   [id number]

## 2010-10-25 NOTE — Progress Notes (Signed)
Summary: med refillgp  Phone Note Refill Request Message from:  Fax from Pharmacy on Jan 22, 2010 11:27 AM  Refills Requested: Medication #1:  LISINOPRIL 40 MG TABS Take 1 tablet by mouth once a day   Last Refilled: 12/19/2009  Method Requested: Electronic Initial call taken by: Morrison Old RN,  Jan 22, 2010 11:27 AM  Follow-up for Phone Call       Follow-up by: Vertell Limber MD,  Jan 24, 2010 3:13 PM    Prescriptions: LISINOPRIL 40 MG TABS (LISINOPRIL) Take 1 tablet by mouth once a day  #31 Tablet x 11   Entered and Authorized by:   Vertell Limber MD   Signed by:   Vertell Limber MD on 01/24/2010   Method used:   Electronically to        [facility] [phone]* (retail)       [place], [place]  [id number]       Ph: [phone] or [phone]       Fax: [phone]   RxID:   [id number]

## 2010-10-25 NOTE — Assessment & Plan Note (Signed)
Summary: ACUTE-KNOT ON SIDE OF NECK/CFB(TOBBIA)   Vital Signs:  Patient profile:   55 year old female Height:      62 inches (157.48 cm) Weight:      133.5 pounds (60.68 kg) BMI:     24.51 Temp:     98.0 degrees F (36.67 degrees C) oral Pulse rate:   90 / minute BP sitting:   165 / 81  (right arm)  Vitals Entered By: Hilda Blades Ditzler RN (September 26, 2009 3:15 PM) Is Patient Diabetic? Yes Did you bring your meter with you today? No Pain Assessment Patient in pain? no      Nutritional Status BMI of 19 -24 = normal Nutritional Status Detail appetite good CBG Result 280  Have you ever been in a relationship where you felt threatened, hurt or afraid?denies   Does patient need assistance? Functional Status Self care Ambulation Normal Comments "knot" left side of neck past 3 days- burns. Foot exam not done - pt saw Dr Amalia Hailey this AM.   Primary Care Provider:  Lajean Saver MD   History of Present Illness: This is a 55 year old woman with past medical history of DM, HTN, HLD.  She is here with complaint of knot on the left side of her neck for the past 3 days.  She had a cold about 10 days ago, but symptoms resolved before the knot apeared.  Knot is painful-feels like burning.  She does not have any ear, throat or sinus pain.  No fevers or chills. No other knots.  The knot has not been warm and has not drained.      Depression History:      The patient denies a depressed mood most of the day and a diminished interest in her usual daily activities.         Preventive Screening-Counseling & Management  Alcohol-Tobacco     Alcohol drinks/day: 0     Smoking Status: current     Smoking Cessation Counseling: yes     Packs/Day: 1/2 ppd     Year Started: ABOUT THE AGE OF 17  Caffeine-Diet-Exercise     Does Patient Exercise: yes     Type of exercise: WALKING     Times/week: 1  Current Medications (verified): 1)  Amlodipine Besylate 5 Mg  Tabs (Amlodipine Besylate) .... 2 By  Mouth Every Day 2)  Lisinopril 40 Mg Tabs (Lisinopril) .... Take 1 Tablet By Mouth Once A Day 3)  Hydrochlorothiazide 25 Mg Tabs (Hydrochlorothiazide) .... Take 1 Tablet By Mouth Once A Day 4)  Zocor 40 Mg Tabs (Simvastatin) .... Take 1 Tablet By Mouth Once A Day 5)  Lantus 100 Unit/ml Soln (Insulin Glargine) .... Inject 25 Units Subcutaneously in The Mrning and 25 Units At Bedtime. 6)  Novolog Flexpen 100 Unit/ml Soln (Insulin Aspart) .... Inject 7 Units With Breakfast, 10 Units With Lunch and 8 Units With Dinner. 7)  Bd Insulin Syringe 31g X 5/16" 0.5 Ml Misc (Insulin Syringe-Needle U-100) .... To Administer Insulin Two Times A Day 8)  Lancets  Misc (Lancets) .... To Test Blood Sugar 4x/day 9)  Bd Uf Short Pen Needles 31gx5b-D .Marland Kitchen.. Use 3 Times Daily With Novolog Flexpen 10)  Aspirin 81 Mg  Tbec (Aspirin) .... One By Mouth Every Day 11)  Prodigy Autocode Blood Glucose  Strp (Glucose Blood) .... Use To Test Blood Sugar 5-6x/day Before All Meals and Bedtime and Before and After Exercise 12)  Prodigy Blood Glucose Monitor W/device Kit (Blood  Glucose Monitoring Suppl) .... Use As Directed 13)  Amitriptyline Hcl 25 Mg Tabs (Amitriptyline Hcl) .... Take 1 Tablet By Mouth At Bedtime 14)  Metoprolol Tartrate 25 Mg Tabs (Metoprolol Tartrate) .... One Tablet By Mouth Two Times A Day  Allergies: 1)  ! Pcn  Social History: Packs/Day:  1/2 ppd  Review of Systems       per hpi  Physical Exam  General:  alert and overweight-appearing.   Head:  normocephalic and atraumatic.   Eyes:  vision grossly intact, pupils equal, pupils round, and pupils reactive to light.   Ears:  R ear normal and L ear normal.  TMs clear. Nose:  no external deformity.   Mouth:  no gingival abnormalities, pharynx pink and moist, no erythema, no exudates, no posterior lymphoid hypertrophy, and teeth missing.   Neck:  supple and full ROM.  there is a 2cm hard mass protruding at the top of the anterior cervical chain on the  left neck.  mobile.  no eyrthema or warmth.  tender to plapation. Lungs:  normal respiratory effort and normal breath sounds.   Heart:  normal rate, regular rhythm, and no murmur.   Cervical Nodes:  enalrged left anterior cervical node 2x2cm.  see neck exam. Axillary Nodes:  no R axillary adenopathy and no L axillary adenopathy.   Inguinal Nodes:  she reports no inguinal swelling Psych:  Oriented X3, memory intact for recent and remote, flat affect, and poor eye contact.     Impression & Recommendations:  Problem # 1:  ENLARGEMENT OF LYMPH NODES (ICD-785.6) I suspect that this is a reactive node from the URI she had one week ago.  Have recomended that she use tylenol for discomfort.  She should monitor the area and if the node is not smaller and less painful by next week she should call the clinic.  If it persists will get CT of the neck to be sure this is not an abcess (it does NOT appear to be).  Will check CBC today.  Orders: T-CBC w/Diff [id number])  Problem # 2:  HYPERTENSION (ICD-401.9) BP is still elevated.  Metoprolol was added in 11/10 and does not seem to have decreased pressure.  I do not think she has hx of CAD/MI and would consider stopping metoprolol and starting a nitrate for BP control.  She is in pain today, and will need to rtc in one month for DM check up so I will not make any changes today.  Her updated medication list for this problem includes:    Amlodipine Besylate 5 Mg Tabs (Amlodipine besylate) .Marland Kitchen... 2 by mouth every day    Lisinopril 40 Mg Tabs (Lisinopril) .Marland Kitchen... Take 1 tablet by mouth once a day    Hydrochlorothiazide 25 Mg Tabs (Hydrochlorothiazide) .Marland Kitchen... Take 1 tablet by mouth once a day    Metoprolol Tartrate 25 Mg Tabs (Metoprolol tartrate) ..... One tablet by mouth two times a day  BP today: 165/81 Prior BP: 154/78 (07/27/2009)  Labs Reviewed: K+: 3.5 (11/24/2008) Creat: : 0.63 (11/24/2008)   Chol: 122 (11/24/2008)   HDL: 34 (11/24/2008)   LDL: 66  (11/24/2008)   TG: 112 (11/24/2008)  Problem # 3:  DIABETES MELLITUS, TYPE II (ICD-250.00) A1C to be checked in one month. cbg elevated today.  She reports compliance with medications.  Her updated medication list for this problem includes:    Lisinopril 40 Mg Tabs (Lisinopril) .Marland Kitchen... Take 1 tablet by mouth once a day    Lantus 100  Unit/ml Soln (Insulin glargine) ..... Inject 25 units subcutaneously in the mrning and 25 units at bedtime.    Novolog Flexpen 100 Unit/ml Soln (Insulin aspart) ..... Inject 7 units with breakfast, 10 units with lunch and 8 units with dinner.    Aspirin 81 Mg Tbec (Aspirin) ..... One by mouth every day  Orders: Capillary Blood Glucose/CBG [phone])  Labs Reviewed: Creat: 0.63 (11/24/2008)     Last Eye Exam: No diabetic retinopathy.    (02/21/2009) Reviewed HgBA1c results: 9.7 (07/27/2009)  10.9 (02/16/2009)  Problem # 4:  ANEMIA-IRON DEFICIENCY (ICD-280.9) Will check iron studies today.  May need to be on repletion.  Orders: T-Ferritin [id number]) Alric Quan [id number])  Complete Medication List: 1)  Amlodipine Besylate 5 Mg Tabs (Amlodipine besylate) .... 2 by mouth every day 2)  Lisinopril 40 Mg Tabs (Lisinopril) .... Take 1 tablet by mouth once a day 3)  Hydrochlorothiazide 25 Mg Tabs (Hydrochlorothiazide) .... Take 1 tablet by mouth once a day 4)  Zocor 40 Mg Tabs (Simvastatin) .... Take 1 tablet by mouth once a day 5)  Lantus 100 Unit/ml Soln (Insulin glargine) .... Inject 25 units subcutaneously in the mrning and 25 units at bedtime. 6)  Novolog Flexpen 100 Unit/ml Soln (Insulin aspart) .... Inject 7 units with breakfast, 10 units with lunch and 8 units with dinner. 7)  Bd Insulin Syringe 31g X 5/16" 0.5 Ml Misc (Insulin syringe-needle u-100) .... To administer insulin two times a day 8)  Lancets Misc (Lancets) .... To test blood sugar 4x/day 9)  Bd Uf Short Pen Needles 31gx5b-d  .... Use 3 times daily with novolog flexpen 10)  Aspirin 81 Mg Tbec  (Aspirin) .... One by mouth every day 11)  Prodigy Autocode Blood Glucose Strp (Glucose blood) .... Use to test blood sugar 5-6x/day before all meals and bedtime and before and after exercise 12)  Prodigy Blood Glucose Monitor W/device Kit (Blood glucose monitoring suppl) .... Use as directed 13)  Amitriptyline Hcl 25 Mg Tabs (Amitriptyline hcl) .... Take 1 tablet by mouth at bedtime 14)  Metoprolol Tartrate 25 Mg Tabs (Metoprolol tartrate) .... One tablet by mouth two times a day  Patient Instructions: 1)  You have a swollen lymph node in your neck.  This is a reaction to the cold that you had last week.  You should take tylenol for pain and burning.  If the lymph node is not smaller by next week please call the clinic. 2)  You had lab work done today, we will call you if there is anything abnormal. Process Orders Check Orders Results:     [facility]: ABN not required for this insurance Tests Sent for requisitioning (September 26, 2009 3:48 PM):     09/26/2009: [facility] -- T-CBC w/Diff [id number] (signed)     09/26/2009: [facility] -- T-Ferritin [phone] (signed)     09/26/2009: [facility] -- Alric Quan [id number] (signed)

## 2010-10-25 NOTE — Consult Note (Signed)
Summary: [facility]  [facility]   Imported By: Lacy Duverney 04/05/2010 11:45:51  _____________________________________________________________________  External Attachment:    Type:   Image     Comment:   External Document

## 2010-10-25 NOTE — Medication Information (Signed)
Summary: DRUG INTERACTION MONOGRAPH  DRUG INTERACTION MONOGRAPH   Imported By: Enedina Finner 06/13/2010 12:10:38  _____________________________________________________________________  External Attachment:    Type:   Image     Comment:   External Document

## 2010-10-25 NOTE — Progress Notes (Signed)
Summary: Drugs  Phone Note Refill Request Message from:  Fax from Pharmacy on May 29, 2010 4:01 PM  Refills Requested: Medication #1:  AMLODIPINE BESYLATE 5 MG  TABS 2 by mouth every day  Medication #2:  ZOCOR 40 MG TABS Take 1 tablet by mouth once a day Pharmacy said faxed over sheet stating possible drug interaction..  Sheet in your box.  Initial call taken by: Sander Nephew RN,  May 29, 2010 4:02 PM  Follow-up for Phone Call        the interation between the two drugs is minor, but thanks to the pharmacy for pointing out that these drugs have interactions... Follow-up by: Vertell Limber MD,  May 30, 2010 11:22 PM

## 2010-10-25 NOTE — Letter (Signed)
Summary: [facility]  [facility] [place] [facility]   Imported By: Garlan Fillers 03/12/2010 12:06:08  _____________________________________________________________________  External Attachment:    Type:   Image     Comment:   External Document

## 2010-10-25 NOTE — Assessment & Plan Note (Signed)
Summary: ACUTE-KNOT STILL HURTING/(TOBBIA)/CFB   Vital Signs:  Patient profile:   55 year old female Height:      62 inches (157.48 cm) Weight:      135.4 pounds (61.55 kg) BMI:     24.85 Temp:     98.6 degrees F (37.00 degrees C) oral Pulse rate:   93 / minute BP sitting:   123 / 78  (left arm)  Vitals Entered By: Hilda Blades Ditzler RN (October 19, 2009 1:40 PM) Is Patient Diabetic? No Pain Assessment Patient in pain? no      Nutritional Status BMI of 19 -24 = normal Nutritional Status Detail appetite good  Have you ever been in a relationship where you felt threatened, hurt or afraid?denies   Does patient need assistance? Functional Status Self care Ambulation Normal Comments FU - no change with left neck area.   Primary Care Provider:  Lajean Saver MD   History of Present Illness: This is a 54 year old woman with past medical history outlined in chart.  She is here today to follow up on a left neck mass that was discussed about 3 weeks ago.  At that time it was thought that she had a reactive lymphnode from a cold which had since resolved.  She has had no improvement in symptoms since that time.  The mass is the same size and still hurts/burns.  She reports no change in appetite, energy level, no night sweats or weight loss.  Depression History:      The patient denies a depressed mood most of the day and a diminished interest in her usual daily activities.         Preventive Screening-Counseling & Management  Alcohol-Tobacco     Alcohol drinks/day: 0     Smoking Status: current     Smoking Cessation Counseling: yes     Packs/Day: 2-3 cigs per day     Year Started: ABOUT THE AGE OF 17  Caffeine-Diet-Exercise     Does Patient Exercise: yes     Type of exercise: WALKING     Times/week: 1  Current Medications (verified): 1)  Amlodipine Besylate 5 Mg  Tabs (Amlodipine Besylate) .... 2 By Mouth Every Day 2)  Lisinopril 40 Mg Tabs (Lisinopril) .... Take 1 Tablet By  Mouth Once A Day 3)  Hydrochlorothiazide 25 Mg Tabs (Hydrochlorothiazide) .... Take 1 Tablet By Mouth Once A Day 4)  Zocor 40 Mg Tabs (Simvastatin) .... Take 1 Tablet By Mouth Once A Day 5)  Lantus 100 Unit/ml Soln (Insulin Glargine) .... Inject 25 Units Subcutaneously in The Mrning and 25 Units At Bedtime. 6)  Novolog Flexpen 100 Unit/ml Soln (Insulin Aspart) .... Inject 7 Units With Breakfast, 10 Units With Lunch and 8 Units With Dinner. 7)  Bd Insulin Syringe 31g X 5/16" 0.5 Ml Misc (Insulin Syringe-Needle U-100) .... To Administer Insulin Two Times A Day 8)  Lancets  Misc (Lancets) .... To Test Blood Sugar 4x/day 9)  Bd Uf Short Pen Needles 31gx5b-D .Marland Kitchen.. Use 3 Times Daily With Novolog Flexpen 10)  Aspirin 81 Mg  Tbec (Aspirin) .... One By Mouth Every Day 11)  Prodigy Autocode Blood Glucose  Strp (Glucose Blood) .... Use To Test Blood Sugar 5-6x/day Before All Meals and Bedtime and Before and After Exercise 12)  Prodigy Blood Glucose Monitor W/device Kit (Blood Glucose Monitoring Suppl) .... Use As Directed 13)  Amitriptyline Hcl 25 Mg Tabs (Amitriptyline Hcl) .... Take 1 Tablet By Mouth At Bedtime 14)  Metoprolol Tartrate 25 Mg Tabs (Metoprolol Tartrate) .... One Tablet By Mouth Two Times A Day  Allergies: 1)  ! Pcn  Social History: Packs/Day:  2-3 cigs per day  Review of Systems       per hpi  Physical Exam  General:  alert and well-developed.   Head:  normocephalic and atraumatic.   Mouth:  pharynx pink and moist, no erythema, no exudates, and no posterior lymphoid hypertrophy.   Neck:  still with 2cm mass, rubbery, firmish, mobile, non tender on the left upper anterior cervical area Lungs:  normal respiratory effort and normal breath sounds.   Heart:  normal rate, regular rhythm, and no murmur.   Psych:  Oriented X3, memory intact for recent and remote, normally interactive, and poor eye contact.     Impression & Recommendations:  Problem # 1:  ENLARGEMENT OF LYMPH NODES  (ICD-785.6) Mass has been present now for about one month.  One week before prior exam and now 3 weeks afterwards.  Concerning!  ddx: part of salivary gland, cyst of some sort, lymphoma, reactive node.  Will get a CT neck to determine what it is and then go from there for next steps.  Orders: CT with Contrast (CT w/ contrast)  Problem # 2:  HYPERTENSION (ICD-401.9) BP is much improved! no changes.   Her updated medication list for this problem includes:    Amlodipine Besylate 5 Mg Tabs (Amlodipine besylate) .Marland Kitchen... 2 by mouth every day    Lisinopril 40 Mg Tabs (Lisinopril) .Marland Kitchen... Take 1 tablet by mouth once a day    Hydrochlorothiazide 25 Mg Tabs (Hydrochlorothiazide) .Marland Kitchen... Take 1 tablet by mouth once a day    Metoprolol Tartrate 25 Mg Tabs (Metoprolol tartrate) ..... One tablet by mouth two times a day  BP today: 123/78 Prior BP: 165/81 (09/26/2009)  Labs Reviewed: K+: 3.5 (11/24/2008) Creat: : 0.63 (11/24/2008)   Chol: 122 (11/24/2008)   HDL: 34 (11/24/2008)   LDL: 66 (11/24/2008)   TG: 112 (11/24/2008)  Problem # 3:  DIABETES MELLITUS, TYPE II (ICD-250.00) DM check up in one month.   Her updated medication list for this problem includes:    Lisinopril 40 Mg Tabs (Lisinopril) .Marland Kitchen... Take 1 tablet by mouth once a day    Lantus 100 Unit/ml Soln (Insulin glargine) ..... Inject 25 units subcutaneously in the mrning and 25 units at bedtime.    Novolog Flexpen 100 Unit/ml Soln (Insulin aspart) ..... Inject 7 units with breakfast, 10 units with lunch and 8 units with dinner.    Aspirin 81 Mg Tbec (Aspirin) ..... One by mouth every day  Labs Reviewed: Creat: 0.63 (11/24/2008)     Last Eye Exam: No diabetic retinopathy.    (02/21/2009) Reviewed HgBA1c results: 9.7 (07/27/2009)  10.9 (02/16/2009)  Complete Medication List: 1)  Amlodipine Besylate 5 Mg Tabs (Amlodipine besylate) .... 2 by mouth every day 2)  Lisinopril 40 Mg Tabs (Lisinopril) .... Take 1 tablet by mouth once a day 3)   Hydrochlorothiazide 25 Mg Tabs (Hydrochlorothiazide) .... Take 1 tablet by mouth once a day 4)  Zocor 40 Mg Tabs (Simvastatin) .... Take 1 tablet by mouth once a day 5)  Lantus 100 Unit/ml Soln (Insulin glargine) .... Inject 25 units subcutaneously in the mrning and 25 units at bedtime. 6)  Novolog Flexpen 100 Unit/ml Soln (Insulin aspart) .... Inject 7 units with breakfast, 10 units with lunch and 8 units with dinner. 7)  Bd Insulin Syringe 31g X 5/16" 0.5 Ml Misc (  Insulin syringe-needle u-100) .... To administer insulin two times a day 8)  Lancets Misc (Lancets) .... To test blood sugar 4x/day 9)  Bd Uf Short Pen Needles 31gx5b-d  .... Use 3 times daily with novolog flexpen 10)  Aspirin 81 Mg Tbec (Aspirin) .... One by mouth every day 11)  Prodigy Autocode Blood Glucose Strp (Glucose blood) .... Use to test blood sugar 5-6x/day before all meals and bedtime and before and after exercise 12)  Prodigy Blood Glucose Monitor W/device Kit (Blood glucose monitoring suppl) .... Use as directed 13)  Amitriptyline Hcl 25 Mg Tabs (Amitriptyline hcl) .... Take 1 tablet by mouth at bedtime 14)  Metoprolol Tartrate 25 Mg Tabs (Metoprolol tartrate) .... One tablet by mouth two times a day  Patient Instructions: 1)  You will have a CT scan of your neck to evaluate the mass there. 2)  I will call you with results and to discuss next steps.   Prevention & Chronic Care Immunizations   Influenza vaccine: Fluvax 3+  (06/22/2009)    Tetanus booster: Not documented   Td booster deferral: Deferred  (07/27/2009)    Pneumococcal vaccine: Not documented  Colorectal Screening   Hemoccult: Not documented   Hemoccult action/deferral: Not indicated  (07/27/2009)    Colonoscopy:  Results: Normal, but limited by prep Location:  [place].    (07/25/2006)   Colonoscopy action/deferral: Per Dr. Carlean Purl: "would not do routine hemoccult cards for 5 years. Given normal colnoscopy with good prep in 2005,  would not work this up further, but defer to Dr. Jerilee Hoh if there is other relevant clinical info that indicates otherwise."  Given this, I will set the due date 09/2013 as 10 years from last normal colonoscopy with good prep  (07/25/2006)   Colonoscopy due: 09/2013  Other Screening   Pap smear:  Specimen Adequacy: Satisfactory for evaluation.   Interpretation/Result:Negative for intraepithelial Lesion or Malignancy.     (02/02/2008)   Pap smear action/deferral: Deferred  (07/27/2009)   Pap smear due: 01/2009    Mammogram: No specific mammographic evidence of malignancy.  Assessment: BIRADS 1.   (03/06/2009)   Mammogram due: 02/2010   Smoking status: current  (10/19/2009)   Smoking cessation counseling: yes  (10/19/2009)  Diabetes Mellitus   HgbA1C: 9.7  (07/27/2009)    Eye exam: No diabetic retinopathy.     (02/21/2009)   Eye exam due: 02/2010    Foot exam: Not documented   Foot exam action/deferral: Deferred   High risk foot: Not documented   Foot care education: Not documented    Urine microalbumin/creatinine ratio: 213.4  (03/15/2008)  Lipids   Total Cholesterol: 122  (11/24/2008)   LDL: 66  (11/24/2008)   LDL Direct: Not documented   HDL: 34  (11/24/2008)   Triglycerides: 112  (11/24/2008)    SGOT (AST): 19  (11/24/2008)   SGPT (ALT): 24  (11/24/2008)   Alkaline phosphatase: 86  (11/24/2008)   Total bilirubin: 0.3  (11/24/2008)  Hypertension   Last Blood Pressure: 123 / 78  (10/19/2009)   Serum creatinine: 0.63  (11/24/2008)   Serum potassium 3.5  (11/24/2008)  Self-Management Support :   Personal Goals (by the next clinic visit) :     Personal A1C goal: 7  (07/27/2009)     Personal blood pressure goal: 130/80  (07/27/2009)     Personal LDL goal: 70  (07/27/2009)    Patient will work on the following items until the next clinic visit to reach self-care goals:  Medications and monitoring: take my medicines every day, check my blood pressure, weigh  myself weekly  (10/19/2009)     Eating: drink diet soda or water instead of juice or soda, eat more vegetables, use fresh or frozen vegetables, eat foods that are low in salt, eat baked foods instead of fried foods, eat fruit for snacks and desserts, limit or avoid alcohol  (10/19/2009)     Activity: take a 30 minute walk every day, take the stairs instead of the elevator  (10/19/2009)    Diabetes self-management support: Copy of home glucose meter record, Written self-care plan  (07/27/2009)   Last diabetes self-management training by diabetes educator: 03/13/2009   Last medical nutrition therapy: 11/24/2008    Hypertension self-management support: Written self-care plan  (07/27/2009)    Lipid self-management support: Written self-care plan  (07/27/2009)

## 2010-10-25 NOTE — Letter (Signed)
Summary: [facility]   Imported By: Garlan Fillers 02/21/2010 14:26:56  _____________________________________________________________________  External Attachment:    Type:   Image     Comment:   External Document

## 2010-10-25 NOTE — Consult Note (Addendum)
Summary: [facility]   Imported By: Garlan Fillers 09/28/2010 11:49:23  _____________________________________________________________________  External Attachment:    Type:   Image     Comment:   External Document  Appended Document: [facility]    Diabetic Eye Exam  Procedure date:  09/13/2010  Findings:      No diabetic retinopathy.     Procedures Next Due Date:    Diabetic Eye Exam: 09/2011   Diabetic Eye Exam  Procedure date:  09/13/2010  Findings:      No diabetic retinopathy.     Procedures Next Due Date:    Diabetic Eye Exam: 09/2011

## 2010-10-25 NOTE — Letter (Addendum)
Summary: [facility]   Imported By: Garlan Fillers 01/25/2010 14:10:03  _____________________________________________________________________  External Attachments:     1. Type:   Image          Comment:   External Document    2. Type:   Image          Comment:   External Document

## 2010-10-25 NOTE — Miscellaneous (Signed)
[facility]   Imported By: Bonner Puna 11/09/2009 16:00:39  _____________________________________________________________________  External Attachment:    Type:   Image     Comment:   External Document

## 2010-10-25 NOTE — Progress Notes (Signed)
Summary: med rfill/gp  Phone Note Refill Request Message from:  Fax from Pharmacy on October 24, 2009 2:14 PM  Refills Requested: Medication #1:  PRODIGY AUTOCODE BLOOD GLUCOSE  STRP Use to test blood sugar 5-6x/day before all meals and bedtime and before and after exercise   Last Refilled: 09/14/2009  Method Requested: Electronic Initial call taken by: Morrison Old RN,  October 24, 2009 2:15 PM  Follow-up for Phone Call        Refill approved-nurse to complete Follow-up by: Rhea Pink  DO,  October 24, 2009 4:45 PM    New/Updated Medications: PRODIGY BLOOD GLUCOSE MONITOR W/DEVICE KIT (BLOOD GLUCOSE MONITORING SUPPL) Use to test blood sugar 5-6x/day before all meals and bedtime and before and after exercise

## 2010-10-25 NOTE — Progress Notes (Signed)
Summary: med refill/gp  Phone Note Refill Request Message from:  Fax from Pharmacy on September 19, 2010 2:39 PM  Refills Requested: Medication #1:  LANCETS  MISC to test blood sugar 4x/day   Last Refilled: 08/17/2010 Prodigy twist top   Method Requested: Electronic Initial call taken by: Morrison Old RN,  September 19, 2010 2:39 PM  Follow-up for Phone Call        Refill approved-nurse to complete Follow-up by: Rhea Pink  DO,  September 20, 2010 1:38 PM    Prescriptions: LANCETS  MISC (LANCETS) to test blood sugar 4x/day  #120 x 11   Entered and Authorized by:   Rhea Pink  DO   Signed by:   Rhea Pink  DO on 09/20/2010   Method used:   Electronically to        [facility] [phone]* (retail)       [place], [place]  [id number]       Ph: [phone] or [phone]       Fax: [phone]   RxID:   [id number]

## 2010-10-25 NOTE — Assessment & Plan Note (Signed)
Summary: FLU SHOT/CH  Nurse Visit   Allergies: 1)  ! Pcn  Immunizations Administered:  Influenza Vaccine # 1:    Vaccine Type: Fluvax Non-MCR    Site: left deltoid    Mfr: GlaxoSmithKline    Dose: 0.5 ml    Route: IM    Given by: Hilda Blades Ditzler RN    Exp. Date: 03/23/2011    Lot #: [id number]    VIS given: 04/17/10 version given June 06, 2010.  Flu Vaccine Consent Questions:    Do you have a history of severe allergic reactions to this vaccine? no    Any prior history of allergic reactions to egg and/or gelatin? no    Do you have a sensitivity to the preservative Thimersol? no    Do you have a past history of Guillan-Barre Syndrome? no    Do you currently have an acute febrile illness? no    Have you ever had a severe reaction to latex? no    Vaccine information given and explained to patient? yes    Are you currently pregnant? no  Orders Added: 1)  Influenza Vaccine NON MCR [id number]

## 2010-10-25 NOTE — Consult Note (Signed)
Summary: [facility]   Imported By: Bonner Puna 11/01/2009 14:23:09  _____________________________________________________________________  External Attachment:    Type:   Image     Comment:   External Document

## 2010-10-25 NOTE — Consult Note (Signed)
[facility]: [facility]   Imported By: Bonner Puna 12/06/2009 09:01:44  _____________________________________________________________________  External Attachment:    Type:   Image     Comment:   External Document

## 2010-10-25 NOTE — Medication Information (Signed)
Summary: SIMVASTATIN  SIMVASTATIN   Imported By: Garlan Fillers 07/25/2010 14:35:35  _____________________________________________________________________  External Attachment:    Type:   Image     Comment:   External Document

## 2010-10-26 ENCOUNTER — Ambulatory Visit (HOSPITAL_COMMUNITY)
Admission: RE | Admit: 2010-10-26 | Discharge: 2010-10-26 | Disposition: A | Discharge status: Home / Self Care | Payer: Medicaid Other | Source: Ambulatory Visit | Attending: Radiation Oncology | Admitting: Radiation Oncology

## 2010-10-26 ENCOUNTER — Other Ambulatory Visit: Payer: Self-pay | Admitting: Internal Medicine

## 2010-10-26 ENCOUNTER — Other Ambulatory Visit: Payer: Self-pay | Admitting: Radiation Oncology

## 2010-10-26 DIAGNOSIS — K219 Gastro-esophageal reflux disease without esophagitis: Secondary | ICD-10-CM

## 2010-10-26 DIAGNOSIS — I2510 Atherosclerotic heart disease of native coronary artery without angina pectoris: Secondary | ICD-10-CM | POA: Insufficient documentation

## 2010-10-26 DIAGNOSIS — K861 Other chronic pancreatitis: Secondary | ICD-10-CM | POA: Insufficient documentation

## 2010-10-26 DIAGNOSIS — I700 Atherosclerosis of aorta: Secondary | ICD-10-CM | POA: Insufficient documentation

## 2010-10-26 DIAGNOSIS — I708 Atherosclerosis of other arteries: Secondary | ICD-10-CM | POA: Insufficient documentation

## 2010-10-26 DIAGNOSIS — C109 Malignant neoplasm of oropharynx, unspecified: Secondary | ICD-10-CM | POA: Insufficient documentation

## 2010-10-26 DIAGNOSIS — K7689 Other specified diseases of liver: Secondary | ICD-10-CM | POA: Insufficient documentation

## 2010-10-26 DIAGNOSIS — I889 Nonspecific lymphadenitis, unspecified: Secondary | ICD-10-CM | POA: Insufficient documentation

## 2010-10-26 DIAGNOSIS — J984 Other disorders of lung: Secondary | ICD-10-CM | POA: Insufficient documentation

## 2010-10-29 ENCOUNTER — Other Ambulatory Visit: Payer: Self-pay | Admitting: *Deleted

## 2010-10-29 MED ORDER — INSULIN PEN NEEDLE 31G X 8 MM MISC: Status: DC

## 2010-11-06 ENCOUNTER — Encounter: Payer: Self-pay | Admitting: Oncology

## 2010-11-08 ENCOUNTER — Ambulatory Visit: Payer: Medicaid Other | Attending: Radiation Oncology | Admitting: Radiation Oncology

## 2010-11-29 ENCOUNTER — Encounter: Payer: Self-pay | Admitting: Internal Medicine

## 2010-12-09 LAB — CREATININE, SERUM
Creatinine, Ser: 0.39 mg/dL — ABNORMAL LOW (ref 0.4–1.2)
GFR calc Af Amer: >60 mL/min (ref >60)
GFR calc non Af Amer: >60 mL/min (ref >60)

## 2010-12-09 LAB — BUN: BUN: 8 mg/dL (ref 6–23)

## 2010-12-10 LAB — GLUCOSE, CAPILLARY: Glucose-Capillary: 243 mg/dL — ABNORMAL HIGH (ref 70–99)

## 2010-12-11 ENCOUNTER — Inpatient Hospital Stay (INDEPENDENT_AMBULATORY_CARE_PROVIDER_SITE_OTHER)
Admission: RE | Admit: 2010-12-11 | Discharge: 2010-12-11 | Disposition: A | Discharge status: Home / Self Care | Payer: Medicaid Other | Source: Ambulatory Visit | Attending: Family Medicine | Admitting: Family Medicine

## 2010-12-11 DIAGNOSIS — H60399 Other infective otitis externa, unspecified ear: Secondary | ICD-10-CM

## 2010-12-13 ENCOUNTER — Inpatient Hospital Stay (INDEPENDENT_AMBULATORY_CARE_PROVIDER_SITE_OTHER)
Admission: RE | Admit: 2010-12-13 | Discharge: 2010-12-13 | Disposition: A | Discharge status: Home / Self Care | Payer: Medicaid Other | Source: Ambulatory Visit | Attending: Family Medicine | Admitting: Family Medicine

## 2010-12-13 DIAGNOSIS — H60399 Other infective otitis externa, unspecified ear: Secondary | ICD-10-CM

## 2010-12-17 LAB — CULTURE, ROUTINE-ABSCESS

## 2011-01-01 ENCOUNTER — Encounter: Payer: Medicaid Other | Admitting: Internal Medicine

## 2011-01-11 ENCOUNTER — Encounter: Payer: Medicaid Other | Admitting: Internal Medicine

## 2011-01-14 ENCOUNTER — Other Ambulatory Visit: Payer: Self-pay | Admitting: Internal Medicine

## 2011-01-24 ENCOUNTER — Other Ambulatory Visit: Payer: Self-pay | Admitting: Internal Medicine

## 2011-02-05 NOTE — Op Note (Signed)
Susan [place], Susan Evans             ACCOUNT NO.:  [id number]   MEDICAL RECORD NO.:  [id number]          PATIENT TYPE:  AMB   LOCATION:  CARD                         FACILITY:  [facility]   PHYSICIAN:  Rigoberto Noel, MD      DATE OF BIRTH:  [birth year]   DATE OF PROCEDURE:  04/08/2007  DATE OF DISCHARGE:                               OPERATIVE REPORT   INDICATION FOR BRONCHOSCOPY:  Hemoptysis with negative imaging.   PREPROCEDURE MEDICATIONS:  Versed 5 mg in divided doses and fentanyl 75  mcg.   PROCEDURE NOTE:  Bronchoscope was inserted from the left naris.  Lidocaine nebulizer was used prior to the procedure.  Upper airway  appeared normal.  Vocal cords had normal appearance and motion.  No  upper airway source of bleeding was noted.  Trachea and both main stem  bronchi did not show any source of bleeding.  Unfortunately the distal  areas could not be examined due to incessant coughing and oxygen  desaturation.  No obvious source of hemoptysis was identified.   The desaturation was transient and improved immediately after  withdrawing the scope.   The patient will follow up in the office in one week's time.      Rigoberto Noel, MD  Electronically Signed     RVA/MEDQ  D:  04/08/2007  T:  04/08/2007  Job:  [id number]

## 2011-02-05 NOTE — Letter (Signed)
March 30, 2007    Deborra Medina, M.D.  [place], [place] [address]   RE:  Susan, [place]  MRN:  [id number]  /  DOB:  11/23/1955   Dear Dr .Derrel Nip:   As you are aware, [name] is a 55 year old African American woman  who is referred for an evaluation of hemoptysis.  She smokes about half  pack per day.  About two months ago, she developed bright red blood  tinged sputum.  She was treated with an antibiotic for about two weeks.  A chest x-ray and a CT chest did not show any abnormalities.  Hemoptysis  subsided, but has now started again for the past few weeks.  She reports  blood streaked sputum on a daily basis, but denies associated dyspnea or  chest pain.  She reports chronic nonproductive cough otherwise.  She  denies prior episodes or hematemesis or melena.   PAST MEDICAL HISTORY:  1. Hypertension.  2. Diabetes for 15 years.  3. Hyperlipidemia.  4. Iron deficiency anemia.  5. EGD in 2005 and negative colonoscopy.  6. Hiatal hernia.   PAST SURGICAL HISTORY:  Breast reduction surgery in 1979.   ALLERGIES:  PENICILLIN.   CURRENT MEDICATIONS:  1. HCTZ 25 mg p.o. daily.  2. Lisinopril 40 mg daily.  3. Amitriptyline 25 mg p.o. daily.  4. Simvastatin 40 mg p.o. daily.  5. Fexofenadine 180 mg p.o. daily .  6. NovoLog insulin t.i.d.  7. Humulin N insulin b.i.d.   SOCIAL HISTORY:  She smokes about a half pack per day, smoked one pack  per day previously for about 33 years.  She is single and has a grown  daughter.  Currently, unemployed.   FAMILY HISTORY:  Her brother had HIV and cancer of indeterminate type.  Father died of heart disease.   REVIEW OF SYSTEMS:  Reports cough productive of occasional clear phlegm,  occasional heart burn, headaches, stiffness in the joints.   PHYSICAL EXAMINATION:  VITAL SIGNS:  Weight 186 pounds, afebrile, heart  rate 88, blood pressure 112/74, oxygen saturation 100% on room air.  HEENT:  No older source of bleeding.  No post  nasal drip.  NECK:  Supple.  No JVD.  No lymphadenopathy.  CVS:  S1, S2.  LUNGS:  Chest clear to auscultation.  ABDOMEN:  Soft, nontender, no organomegaly.  NEUROLOGICAL:  Nonfocal.  EXTREMITIES:  No edema.   LABORATORY DATA:  None available.  I have reviewed her CT scan.   IMPRESSION:  Hemoptysis in this heavy smoker that has persistent for  about two months with negative imaging studies.   RECOMMENDATIONS:  1. I discussed an inspection bronchoscopy with her, the risks of the      procedure including coughing, bleeding was discussed with her in      detail, and she evidenced understanding.  We will inspect the upper      airway and the bronchial tree for services for hemoptysis.  Further      management will be based on the inspection study.  2. Clearly smoking cessation is paramount here, and this was      emphasized.  She stated that she would try to use the nicotine      patches and quit.  We will keep you informed as to her progress.    Sincerely,      Rigoberto Noel, MD  Electronically Signed   RVA/MedQ  DD: 03/30/2007  DT: 03/31/2007  Job #: [id number]

## 2011-02-08 ENCOUNTER — Other Ambulatory Visit: Payer: Self-pay | Admitting: Internal Medicine

## 2011-02-08 NOTE — Group Therapy Note (Signed)
NAME:  Susan [place], Susan [place]                       ACCOUNT NO.:  [id number]   MEDICAL RECORD NO.:  [id number]                   PATIENT TYPE:  OUT   LOCATION:  [facility]                           FACILITY:  [facility]   PHYSICIAN:  Andrew Au, MD                     DATE OF BIRTH:  [birth year]   DATE OF SERVICE:  03/08/2004                                    CLINIC NOTE   CHIEF COMPLAINT:  Postmenopausal bleeding.   HISTORY OF PRESENT ILLNESS:  [name] is a 55 year old patient of Dr. Sheila Oats who has had intermittent spotting after being postmenopausal for  approximately 5 years.  In her initial evaluation by Dr. Stefanie Libel she had a  normal Pap smear, negative chlamydia, negative GC probes, normal platelet  count, and normal hemoglobin.   PAST MEDICAL HISTORY:  Significant for the fact that the patient is a  diabetic with historically poorly-controlled sugars as well as a smoker.  She has not been on hormone therapy in the recent or distant past.  Medicines include Lantus, Glucophage, lisinopril, hydrochlorothiazide.   REVIEW OF SYSTEMS:  Reviewed and on the chart.  Significant for complaint of  some muscle aches and weight loss as well as some right-sided pelvic pain.   PHYSICAL EXAMINATION:  VITAL SIGNS:  Blood pressure 112/74, pulse 104.  GENERAL:  She is a thin black woman who appears older than her stated age.  GENITOURINARY:  Normal external female genitalia, no lesions.  On bimanual  exam the uterus feels anteverted and has a small nodule on the anterior side  of the cervix.  The uterus is small in size.  Vaginal vault reveals a small  amount of white discharge, no lesions.  Cervix is located near the left end  of the cul-de-sac, is atrophic.  The cervical os was stenotic and difficult  to sound.  An endometrial biopsy was attempted but it is unsure whether or  not that true endometrial tissue was obtained.   ASSESSMENT AND PLAN:  Postmenopausal bleeding.  Suspect the  endometrial  biopsy sample may not be adequate.  However, will send the sample that was  obtained to pathology and follow up on the results.  Will schedule the  patient for a pelvic ultrasound to further evaluate endometrium.  Will  follow up with the patient as needed depending on the results of these  tests.   The patient was seen with, examined by, and the endometrial biopsy was  obtained with the assistance of Dr. Andrew Au.     Harmon Pier, MD                        Andrew Au, MD    CH/MEDQ  D:  03/08/2004  T:  03/08/2004  Job:  [id number]   cc:   T. Stefanie Libel, M.D.  [facility]

## 2011-02-11 MED ORDER — SIMVASTATIN 20 MG PO TABS: 20.0000 mg | ORAL_TABLET | Freq: Every day | ORAL | Status: DC

## 2011-02-21 ENCOUNTER — Other Ambulatory Visit: Payer: Self-pay | Admitting: Internal Medicine

## 2011-03-11 ENCOUNTER — Other Ambulatory Visit: Payer: Self-pay | Admitting: Oncology

## 2011-03-11 ENCOUNTER — Ambulatory Visit (HOSPITAL_COMMUNITY)
Admission: RE | Admit: 2011-03-11 | Discharge: 2011-03-11 | Disposition: A | Discharge status: Home / Self Care | Payer: Medicaid Other | Source: Ambulatory Visit | Attending: Oncology | Admitting: Oncology

## 2011-03-11 ENCOUNTER — Encounter (HOSPITAL_BASED_OUTPATIENT_CLINIC_OR_DEPARTMENT_OTHER): Payer: Medicaid Other | Admitting: Oncology

## 2011-03-11 ENCOUNTER — Encounter (HOSPITAL_COMMUNITY): Payer: Self-pay

## 2011-03-11 DIAGNOSIS — C140 Malignant neoplasm of pharynx, unspecified: Secondary | ICD-10-CM | POA: Insufficient documentation

## 2011-03-11 DIAGNOSIS — C01 Malignant neoplasm of base of tongue: Secondary | ICD-10-CM

## 2011-03-11 DIAGNOSIS — R490 Dysphonia: Secondary | ICD-10-CM | POA: Insufficient documentation

## 2011-03-11 DIAGNOSIS — I658 Occlusion and stenosis of other precerebral arteries: Secondary | ICD-10-CM | POA: Insufficient documentation

## 2011-03-11 DIAGNOSIS — I6529 Occlusion and stenosis of unspecified carotid artery: Secondary | ICD-10-CM | POA: Insufficient documentation

## 2011-03-11 DIAGNOSIS — C109 Malignant neoplasm of oropharynx, unspecified: Secondary | ICD-10-CM

## 2011-03-11 LAB — CBC WITH DIFFERENTIAL/PLATELET
BASO%: 0.4 % (ref 0.0–2.0)
Basophils Absolute: 0 10*3/uL (ref 0.0–0.1)
EOS%: 1.4 % (ref 0.0–7.0)
HCT: 33.7 % — ABNORMAL LOW (ref 34.8–46.6)
HGB: 10.7 g/dL — ABNORMAL LOW (ref 11.6–15.9)
LYMPH%: 22.9 % (ref 14.0–49.7)
MCH: 23.4 pg — ABNORMAL LOW (ref 25.1–34.0)
MCHC: 31.8 g/dL (ref 31.5–36.0)
NEUT%: 68.2 % (ref 38.4–76.8)
Platelets: 211 10*3/uL (ref 145–400)

## 2011-03-11 LAB — CMP (CANCER CENTER ONLY)
ALT(SGPT): 24 U/L (ref 10–47)
BUN, Bld: 8 mg/dL (ref 7–22)
CO2: 32 mEq/L (ref 18–33)
Calcium: 9.3 mg/dL (ref 8.0–10.3)
Creat: 0.6 mg/dl (ref 0.6–1.2)
Total Bilirubin: 0.5 mg/dl (ref 0.20–1.60)

## 2011-03-11 MED ORDER — IOHEXOL 300 MG/ML  SOLN
100.0000 mL | Freq: Once | INTRAMUSCULAR | Status: AC | PRN
  Administered 2011-03-11: 100 mL via INTRAVENOUS

## 2011-03-12 LAB — T4, FREE: Free T4: 0.92 ng/dL (ref 0.80–1.80)

## 2011-03-18 ENCOUNTER — Other Ambulatory Visit: Payer: Self-pay | Admitting: Internal Medicine

## 2011-03-24 ENCOUNTER — Other Ambulatory Visit: Payer: Self-pay | Admitting: Internal Medicine

## 2011-03-25 ENCOUNTER — Encounter: Payer: Medicaid Other | Admitting: Internal Medicine

## 2011-03-26 ENCOUNTER — Other Ambulatory Visit: Payer: Self-pay | Admitting: *Deleted

## 2011-03-26 ENCOUNTER — Encounter: Payer: Medicaid Other | Admitting: Internal Medicine

## 2011-03-26 MED ORDER — INSULIN PEN NEEDLE 31G X 8 MM MISC: Status: DC

## 2011-03-28 ENCOUNTER — Other Ambulatory Visit: Payer: Self-pay | Admitting: Oncology

## 2011-03-28 ENCOUNTER — Encounter (HOSPITAL_BASED_OUTPATIENT_CLINIC_OR_DEPARTMENT_OTHER): Payer: Medicaid Other | Admitting: Oncology

## 2011-03-28 DIAGNOSIS — C140 Malignant neoplasm of pharynx, unspecified: Secondary | ICD-10-CM

## 2011-04-02 ENCOUNTER — Telehealth: Payer: Self-pay | Admitting: *Deleted

## 2011-04-02 NOTE — Telephone Encounter (Signed)
Pt calls and states she was notified by the "people in [place]" that her K+ was 3.0 and she needed to call her pcp to get some K+ medicine? Does not seem to be able to tell me more, appt is made for 7/11 at 1315 by sharonb., pt is agreeable

## 2011-04-03 ENCOUNTER — Ambulatory Visit (INDEPENDENT_AMBULATORY_CARE_PROVIDER_SITE_OTHER): Payer: Medicaid Other | Admitting: Internal Medicine

## 2011-04-03 ENCOUNTER — Encounter: Payer: Self-pay | Admitting: Internal Medicine

## 2011-04-03 DIAGNOSIS — E876 Hypokalemia: Secondary | ICD-10-CM | POA: Insufficient documentation

## 2011-04-03 DIAGNOSIS — E119 Type 2 diabetes mellitus without complications: Secondary | ICD-10-CM

## 2011-04-03 DIAGNOSIS — E871 Hypo-osmolality and hyponatremia: Secondary | ICD-10-CM

## 2011-04-03 DIAGNOSIS — I10 Essential (primary) hypertension: Secondary | ICD-10-CM

## 2011-04-03 LAB — BASIC METABOLIC PANEL WITH GFR
BUN: 6 mg/dL (ref 6–23)
Chloride: 92 mEq/L — ABNORMAL LOW (ref 96–112)
Glucose, Bld: 150 mg/dL — ABNORMAL HIGH (ref 70–99)
Potassium: 3 mEq/L — ABNORMAL LOW (ref 3.5–5.3)

## 2011-04-03 MED ORDER — POTASSIUM CHLORIDE ER 10 MEQ PO TBCR: EXTENDED_RELEASE_TABLET | ORAL | Status: DC

## 2011-04-03 NOTE — Progress Notes (Signed)
Addended by: Julius Bowels T on: 04/03/2011 03:49 PM   Modules accepted: Orders

## 2011-04-03 NOTE — Progress Notes (Signed)
History of present illness: Mrs. 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 She states that she has some blood work done in [place] and was told that her potassium was low at 3.0 last week. She has not taken any supplementation at home for her low potassium. She denies any muscle cramps, chest pain, shortness of breath, nausea or vomiting, fever or chills. She states that she is completely asymptomatic and has no complaints today.  As for her cancer, she is followed by Dr. Suzan Garibaldi and Dr. Lamonte Sakai.  She reports medication compliance except for not taking her blood pressure medication last night. She also states that she checks her sugar at home several times a day in range from 140 to 200s. She is current currently taking Lantus 20 unit in the morning and 20 units at night, NovoLog 7 units after each meal.   ROS: as per HPI  PE: General: alert, thin appearing woman, and cooperative to examination.  Lungs: normal respiratory effort, no accessory muscle use, normal breath sounds, no crackles, and no wheezes. Heart: normal rate, regular rhythm, no murmur, no gallop, and no rub.  Abdomen: soft, non-tender, normal bowel sounds, no distention, no guarding, no rebound tenderness Msk: no joint swelling, no joint warmth, and no redness over joints.  Pulses: 2+ DP/PT pulses bilaterally Extremities: No cyanosis, clubbing, edema Neurologic: alert & oriented X3, cranial nerves II-XII intact, strength normal in all extremities, sensation intact to light touch, and gait normal.  Skin: turgor normal and no rashes. Psych: flat affect

## 2011-04-03 NOTE — Progress Notes (Signed)
Addended by: Julius Bowels T on: 04/03/2011 04:05 PM   Modules accepted: Orders, Medications

## 2011-04-03 NOTE — Assessment & Plan Note (Addendum)
Not well-controlled. HbA1c today was 8.3.  She did not bring her glucose meter today and  stated that her CBGs at home are in the 140-200's at home.  She is currently using Lantus 20u in AM and 20 u in PM, and Novolog 7 units after meals.   -Will continue current regimen at this time -Will check U/A, microalbumin/creatinine ratio, patient was told by her other doctor in [place] that she has proteinuria and she wants to be checked even though she is already on ACEi.  -Continue Lisinopril 40mg  po qd

## 2011-04-03 NOTE — Assessment & Plan Note (Addendum)
Not adequately controlled.  Likely because patient did not take her BP meds the night prior to clinic visit.  Repeat BP was still 151/79. -Will hold HCTZ in setting of possible hypokalemia. -Will check stat BMP and Mg level today and  I will call patient with lab results because she states that she does not want to wait. -Continue Lisinopril and Metoprolol.

## 2011-04-03 NOTE — Patient Instructions (Signed)
Will get labs today and I will call you with any abnormal results Hold hydrochlorothiazide (fluid pill)  for now until we get the results of your potassium You may need to return to clinic for repeat blood work if your potassium is low

## 2011-04-03 NOTE — Assessment & Plan Note (Signed)
Likely from volume contraction.  Na 132 and she is completely asymptomatic.  No confusion or altered mental status noted.   -Will encourage increase in fluid intake -Repeat BMP in AM

## 2011-04-03 NOTE — Assessment & Plan Note (Signed)
Likely 2/2 diuretic-hctz.  Magnesium level is wnl.   -Continue to hold hctz  -Will call in KCl 79mEq po x 3 doses 4 hours apart  -Repeat BMP in AM

## 2011-04-04 ENCOUNTER — Other Ambulatory Visit: Payer: Medicaid Other

## 2011-04-04 LAB — URINALYSIS, MICROSCOPIC ONLY
Bacteria, UA: NONE SEEN
Casts: NONE SEEN
Crystals: NONE SEEN
Squamous Epithelial / LPF: NONE SEEN

## 2011-04-04 LAB — URINALYSIS, ROUTINE W REFLEX MICROSCOPIC
Bilirubin Urine: NEGATIVE
Glucose, UA: NEGATIVE mg/dL
Ketones, ur: NEGATIVE mg/dL
Protein, ur: NEGATIVE mg/dL
Urobilinogen, UA: 0.2 mg/dL (ref 0.0–1.0)

## 2011-04-04 LAB — MICROALBUMIN / CREATININE URINE RATIO: Microalb, Ur: 10.79 mg/dL — ABNORMAL HIGH (ref 0.00–1.89)

## 2011-04-05 ENCOUNTER — Other Ambulatory Visit: Payer: Medicaid Other

## 2011-04-05 DIAGNOSIS — E871 Hypo-osmolality and hyponatremia: Secondary | ICD-10-CM

## 2011-04-05 DIAGNOSIS — E876 Hypokalemia: Secondary | ICD-10-CM

## 2011-04-05 LAB — BASIC METABOLIC PANEL WITH GFR
BUN: 10 mg/dL (ref 6–23)
CO2: 25 mEq/L (ref 19–32)
Calcium: 9.5 mg/dL (ref 8.4–10.5)
Chloride: 104 mEq/L (ref 96–112)
Creat: 0.5 mg/dL (ref 0.50–1.10)
GFR, Est Non African American: >60 mL/min (ref >60)

## 2011-04-29 ENCOUNTER — Other Ambulatory Visit: Payer: Self-pay | Admitting: *Deleted

## 2011-04-29 MED ORDER — "INSULIN SYRINGE-NEEDLE U-100 31G X 5/16"" 0.5 ML MISC": Status: DC

## 2011-05-02 ENCOUNTER — Ambulatory Visit
Admission: RE | Admit: 2011-05-02 | Discharge: 2011-05-02 | Disposition: A | Discharge status: Home / Self Care | Payer: Medicaid Other | Source: Ambulatory Visit | Attending: Radiation Oncology | Admitting: Radiation Oncology

## 2011-05-06 ENCOUNTER — Ambulatory Visit (INDEPENDENT_AMBULATORY_CARE_PROVIDER_SITE_OTHER): Payer: Medicaid Other | Admitting: Internal Medicine

## 2011-05-06 ENCOUNTER — Encounter: Payer: Self-pay | Admitting: Internal Medicine

## 2011-05-06 VITALS — BP 135/77 | HR 81 | Temp 98.0°F | Ht 61.0 in | Wt 122.6 lb

## 2011-05-06 DIAGNOSIS — E876 Hypokalemia: Secondary | ICD-10-CM

## 2011-05-06 DIAGNOSIS — I10 Essential (primary) hypertension: Secondary | ICD-10-CM

## 2011-05-06 DIAGNOSIS — E119 Type 2 diabetes mellitus without complications: Secondary | ICD-10-CM

## 2011-05-06 DIAGNOSIS — M25519 Pain in unspecified shoulder: Secondary | ICD-10-CM

## 2011-05-06 DIAGNOSIS — C109 Malignant neoplasm of oropharynx, unspecified: Secondary | ICD-10-CM

## 2011-05-06 DIAGNOSIS — E785 Hyperlipidemia, unspecified: Secondary | ICD-10-CM

## 2011-05-06 DIAGNOSIS — D509 Iron deficiency anemia, unspecified: Secondary | ICD-10-CM

## 2011-05-06 DIAGNOSIS — Z0000 Encounter for general adult medical examination without abnormal findings: Secondary | ICD-10-CM

## 2011-05-06 MED ORDER — TRAMADOL HCL 50 MG PO TABS: 50.0000 mg | ORAL_TABLET | Freq: Four times a day (QID) | ORAL | Status: DC | PRN

## 2011-05-06 NOTE — Assessment & Plan Note (Addendum)
She is up-to-date with her health maintenance related to diabetes, and is on an ACE inhibitor, her last A1c was 8.3 in July 2012, patient was prescribed 25 units of Lantus 2 times a day along with NovoLog 7 units after meals. However she has only been taking 20 units of Lantus 2 times a day. Given that her sugars are between 200-300, i encourage the patient to take 25 units of Lantus 2 times a day and to followup for an A1c checked in 2 months, per schedule.

## 2011-05-06 NOTE — Assessment & Plan Note (Signed)
Patient is on Zocor and her last fasting lipid was an LDL of 34, we will recheck fasting lipid today.

## 2011-05-06 NOTE — Assessment & Plan Note (Signed)
Followed by [facility].

## 2011-05-06 NOTE — Progress Notes (Signed)
Pt refuses Tdap - Dr Vanessa Kick aware. Hilda Blades Linsay Vogt RN 8/13/123PM

## 2011-05-06 NOTE — Patient Instructions (Addendum)
Please take lantus 25 units twice daily.

## 2011-05-06 NOTE — Assessment & Plan Note (Signed)
Well controlled on current treatment, No new changes made today, Will continue to monitor.   

## 2011-05-06 NOTE — Progress Notes (Signed)
  Subjective:    Patient ID: Susan [place], female    DOB: 1956/04/20, 55 y.o.   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 Today she denies new complaints except for mild left shoulder pain, and is here for lab tests.  For her DM, she has been taking less lantus than she was prescribed, her CBG log shows readings from 200-300.  As for her cancer, she is followed by Dr. Suzan Garibaldi and Dr. Lamonte Sakai. She reports medication compliance except for not taking her blood pressure medication last night. She denies any muscle cramps, chest pain, shortness of breath, nausea or vomiting, fever or chills.  Note for health maintenance patient declines to have a tetanus shot or a pneumonia shot or a Pap smear today, even though she is due for all of these, she has been explained the risks of not adhering to her health maintenance.  Review of Systems  All other systems reviewed and are negative.       Objective:   Physical Exam  Nursing note and vitals reviewed. Constitutional: She is oriented to person, place, and time. She appears well-developed and well-nourished.  HENT:  Head: Normocephalic and atraumatic.  Eyes: Pupils are equal, round, and reactive to light.  Neck: Normal range of motion. Neck supple. No JVD present. No thyromegaly present.  Cardiovascular: Normal rate, regular rhythm and normal heart sounds.   No murmur heard. Pulmonary/Chest: Effort normal and breath sounds normal. She has no wheezes. She has no rales.  Abdominal: Soft. Bowel sounds are normal.  Musculoskeletal: Normal range of motion. She exhibits no edema.  Neurological: She is alert and oriented to person, place, and time.  Skin: Skin is warm and dry.          Assessment & Plan:

## 2011-05-06 NOTE — Assessment & Plan Note (Signed)
Ferritin and iron normal, with low Hg and MCV, this may be related to malignancy. Recheck anemia panel next visit.

## 2011-05-07 LAB — BASIC METABOLIC PANEL
BUN: 12 mg/dL (ref 6–23)
CO2: 29 mEq/L (ref 19–32)
Calcium: 10.1 mg/dL (ref 8.4–10.5)
Chloride: 93 mEq/L — ABNORMAL LOW (ref 96–112)
Creat: 0.58 mg/dL (ref 0.50–1.10)

## 2011-05-07 LAB — LIPID PANEL
HDL: 44 mg/dL (ref >39)
LDL Cholesterol: 77 mg/dL (ref 0–99)
Total CHOL/HDL Ratio: 3.5 Ratio

## 2011-05-08 MED ORDER — POTASSIUM CHLORIDE ER 10 MEQ PO TBCR: EXTENDED_RELEASE_TABLET | ORAL | Status: DC

## 2011-05-08 NOTE — Progress Notes (Signed)
Addended by: Vertell Limber on: 05/08/2011 12:10 PM   Modules accepted: Orders

## 2011-05-12 ENCOUNTER — Other Ambulatory Visit: Payer: Self-pay | Admitting: Internal Medicine

## 2011-05-25 ENCOUNTER — Other Ambulatory Visit: Payer: Self-pay | Admitting: Internal Medicine

## 2011-06-22 ENCOUNTER — Other Ambulatory Visit: Payer: Self-pay | Admitting: Internal Medicine

## 2011-06-24 LAB — GLUCOSE, CAPILLARY: Glucose-Capillary: 211 — ABNORMAL HIGH

## 2011-06-25 ENCOUNTER — Ambulatory Visit: Payer: Medicaid Other

## 2011-06-27 ENCOUNTER — Other Ambulatory Visit: Payer: Self-pay | Admitting: *Deleted

## 2011-06-27 ENCOUNTER — Other Ambulatory Visit: Payer: Self-pay | Admitting: Internal Medicine

## 2011-06-27 MED ORDER — SIMVASTATIN 20 MG PO TABS: 20.0000 mg | ORAL_TABLET | Freq: Every day | ORAL | Status: DC

## 2011-07-18 ENCOUNTER — Ambulatory Visit (INDEPENDENT_AMBULATORY_CARE_PROVIDER_SITE_OTHER): Payer: Medicaid Other

## 2011-07-18 DIAGNOSIS — Z23 Encounter for immunization: Secondary | ICD-10-CM

## 2011-07-23 ENCOUNTER — Encounter: Payer: Medicaid Other | Admitting: Internal Medicine

## 2011-07-24 ENCOUNTER — Ambulatory Visit (INDEPENDENT_AMBULATORY_CARE_PROVIDER_SITE_OTHER): Payer: Medicaid Other | Admitting: Internal Medicine

## 2011-07-24 ENCOUNTER — Encounter: Payer: Self-pay | Admitting: Internal Medicine

## 2011-07-24 ENCOUNTER — Other Ambulatory Visit (HOSPITAL_COMMUNITY)
Admission: RE | Admit: 2011-07-24 | Discharge: 2011-07-24 | Disposition: A | Discharge status: Home / Self Care | Payer: Medicaid Other | Source: Ambulatory Visit | Attending: Internal Medicine | Admitting: Internal Medicine

## 2011-07-24 VITALS — BP 109/73 | HR 112 | Temp 97.7°F | Ht 61.0 in | Wt 120.8 lb

## 2011-07-24 DIAGNOSIS — Z01419 Encounter for gynecological examination (general) (routine) without abnormal findings: Secondary | ICD-10-CM | POA: Insufficient documentation

## 2011-07-24 DIAGNOSIS — M25519 Pain in unspecified shoulder: Secondary | ICD-10-CM

## 2011-07-24 DIAGNOSIS — Z0000 Encounter for general adult medical examination without abnormal findings: Secondary | ICD-10-CM

## 2011-07-24 DIAGNOSIS — E119 Type 2 diabetes mellitus without complications: Secondary | ICD-10-CM

## 2011-07-24 DIAGNOSIS — I10 Essential (primary) hypertension: Secondary | ICD-10-CM

## 2011-07-24 DIAGNOSIS — M25512 Pain in left shoulder: Secondary | ICD-10-CM | POA: Insufficient documentation

## 2011-07-24 LAB — GLUCOSE, CAPILLARY: Glucose-Capillary: 303 mg/dL — ABNORMAL HIGH (ref 70–99)

## 2011-07-24 MED ORDER — HYDROCODONE-ACETAMINOPHEN 5-500 MG PO TABS: 1.0000 | ORAL_TABLET | Freq: Four times a day (QID) | ORAL | Status: DC | PRN

## 2011-07-24 NOTE — Assessment & Plan Note (Signed)
She is up-to-date with her health maintenance related to diabetes, and is on an ACE inhibitor, her A1c today is 8.7, patient was prescribed 25 units of Lantus 2 times a day along with NovoLog 7 units after meals. However she has only been taking 20 units of Lantus 2 times a day. Given that her sugars are between 200-300, i encourage the patient to take 25 units of Lantus 2 times a day and to followup for an A1c checked in 3 months, per schedule.

## 2011-07-24 NOTE — Patient Instructions (Signed)
Back Pain, Adult Low back pain is very common. About 1 in 5 people have back pain.The cause of low back pain is rarely dangerous. The pain often gets better over time.About half of people with a sudden onset of back pain feel better in just 2 weeks. About 8 in 10 people feel better by 6 weeks.  CAUSES Some common causes of back pain include:  Strain of the muscles or ligaments supporting the spine.   Wear and tear (degeneration) of the spinal discs.   Arthritis.   Direct injury to the back.  DIAGNOSIS Most of the time, the direct cause of low back pain is not known.However, back pain can be treated effectively even when the exact cause of the pain is unknown.Answering your caregiver's questions about your overall health and symptoms is one of the most accurate ways to make sure the cause of your pain is not dangerous. If your caregiver needs more information, he or she may order lab work or imaging tests (X-rays or MRIs).However, even if imaging tests show changes in your back, this usually does not require surgery. HOME CARE INSTRUCTIONS For many people, back pain returns.Since low back pain is rarely dangerous, it is often a condition that people can learn to manageon their own.   Remain active. It is stressful on the back to sit or stand in one place. Do not sit, drive, or stand in one place for more than 30 minutes at a time. Take short walks on level surfaces as soon as pain allows.Try to increase the length of time you walk each day.   Do not stay in bed.Resting more than 1 or 2 days can delay your recovery.   Do not avoid exercise or work.Your body is made to move.It is not dangerous to be active, even though your back may hurt.Your back will likely heal faster if you return to being active before your pain is gone.   Pay attention to your body when you bend and lift. Many people have less discomfortwhen lifting if they bend their knees, keep the load close to their  bodies,and avoid twisting. Often, the most comfortable positions are those that put less stress on your recovering back.   Find a comfortable position to sleep. Use a firm mattress and lie on your side with your knees slightly bent. If you lie on your back, put a pillow under your knees.   Only take over-the-counter or prescription medicines as directed by your caregiver. Over-the-counter medicines to reduce pain and inflammation are often the most helpful.Your caregiver may prescribe muscle relaxant drugs.These medicines help dull your pain so you can more quickly return to your normal activities and healthy exercise.   Put ice on the injured area.   Put ice in a plastic bag.   Place a towel between your skin and the bag.   Leave the ice on for 15 to 20 minutes, 3 to 4 times a day for the first 2 to 3 days. After that, ice and heat may be alternated to reduce pain and spasms.   Ask your caregiver about trying back exercises and gentle massage. This may be of some benefit.   Avoid feeling anxious or stressed.Stress increases muscle tension and can worsen back pain.It is important to recognize when you are anxious or stressed and learn ways to manage it.Exercise is a great option.  SEEK MEDICAL CARE IF:  You have pain that is not relieved with rest or medicine.   You have   pain that does not improve in 1 week.   You have new symptoms.   You are generally not feeling well.  SEEK IMMEDIATE MEDICAL CARE IF:   You have pain that radiates from your back into your legs.   You develop new bowel or bladder control problems.   You have unusual weakness or numbness in your arms or legs.   You develop nausea or vomiting.   You develop abdominal pain.   You feel faint.  Document Released: 09/09/2005 Document Revised: 05/22/2011 Document Reviewed: 01/28/2011 [employer] Patient Information 2012 [employer]. 

## 2011-07-24 NOTE — Assessment & Plan Note (Signed)
Patient complains of shoulder pain, last visit she was given tramadol without relief, will give small dose of Vicodin. If the pain does not spontaneously resolve will consider referral to physical therapy or sports medicine

## 2011-07-24 NOTE — Progress Notes (Signed)
Addended by: Vertell Limber on: 07/24/2011 11:24 AM   Modules accepted: Orders

## 2011-07-24 NOTE — Progress Notes (Signed)
  Subjective:    Patient ID: Susan Evans, female    DOB: [birth year], 55 y.o.   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 Today she denies new complaints except for mild left shoulder pain, and is here for her DM, she has been taking less lantus than she was prescribed, her CBG log shows readings from 200-300. As for her cancer, she is followed by Dr. Suzan Garibaldi and Dr. Lamonte Sakai. She denies any muscle cramps, chest pain, shortness of breath, nausea or vomiting, fever or chills. For health maintenance patient would like a Pap smear today   Review of Systems  All other systems reviewed and are negative.       Objective:   Physical Exam  Nursing note and vitals reviewed. Constitutional: She is oriented to person, place, and time. She appears well-developed and well-nourished.  HENT:  Head: Normocephalic and atraumatic.  Eyes: Pupils are equal, round, and reactive to light.  Neck: Normal range of motion. Neck supple. No JVD present. No thyromegaly present.  Cardiovascular: Normal rate, regular rhythm and normal heart sounds.   No murmur heard. Pulmonary/Chest: Effort normal and breath sounds normal. She has no wheezes. She has no rales.  Abdominal: Soft. Bowel sounds are normal.  Musculoskeletal: Normal range of motion. She exhibits no edema.  Neurological: She is alert and oriented to person, place, and time.  Skin: Skin is warm and dry.          Assessment & Plan:

## 2011-07-24 NOTE — Assessment & Plan Note (Signed)
Well controlled on current treatment, No new changes made today, Will continue to monitor.   

## 2011-07-25 ENCOUNTER — Ambulatory Visit: Payer: Medicaid Other | Attending: Radiation Oncology | Admitting: Radiation Oncology

## 2011-07-26 ENCOUNTER — Other Ambulatory Visit: Payer: Self-pay | Admitting: Internal Medicine

## 2011-07-26 DIAGNOSIS — K219 Gastro-esophageal reflux disease without esophagitis: Secondary | ICD-10-CM

## 2011-09-09 ENCOUNTER — Other Ambulatory Visit: Payer: Self-pay | Admitting: Internal Medicine

## 2011-09-12 ENCOUNTER — Ambulatory Visit: Payer: Medicaid Other | Admitting: Radiation Oncology

## 2011-09-14 ENCOUNTER — Other Ambulatory Visit: Payer: Self-pay | Admitting: Internal Medicine

## 2011-09-19 ENCOUNTER — Other Ambulatory Visit: Payer: Self-pay | Admitting: Internal Medicine

## 2011-09-21 ENCOUNTER — Telehealth: Payer: Self-pay | Admitting: Oncology

## 2011-09-21 NOTE — Telephone Encounter (Signed)
S/w pt today re appts for 1/4 adn 1/7. Also confirmed 1/4 ct appt. Pt to get jan/july schedule when she comes in 1/4.

## 2011-09-23 ENCOUNTER — Other Ambulatory Visit: Payer: Self-pay | Admitting: Internal Medicine

## 2011-09-23 DIAGNOSIS — Z1231 Encounter for screening mammogram for malignant neoplasm of breast: Secondary | ICD-10-CM

## 2011-09-25 ENCOUNTER — Other Ambulatory Visit: Payer: Self-pay | Admitting: Internal Medicine

## 2011-09-26 ENCOUNTER — Ambulatory Visit
Admission: RE | Admit: 2011-09-26 | Discharge: 2011-09-26 | Disposition: A | Discharge status: Home / Self Care | Payer: Medicaid Other | Source: Ambulatory Visit | Attending: Radiation Oncology | Admitting: Radiation Oncology

## 2011-09-26 ENCOUNTER — Encounter: Payer: Self-pay | Admitting: Radiation Oncology

## 2011-09-26 ENCOUNTER — Telehealth: Payer: Self-pay | Admitting: Oncology

## 2011-09-26 DIAGNOSIS — M25512 Pain in left shoulder: Secondary | ICD-10-CM

## 2011-09-26 MED ORDER — HYDROCODONE-ACETAMINOPHEN 5-500 MG PO TABS: 1.0000 | ORAL_TABLET | Freq: Four times a day (QID) | ORAL | Status: DC | PRN

## 2011-09-26 NOTE — Progress Notes (Signed)
CC:   Nobie Putnam, M.D. 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  PREVIOUS INTERVENTIONS: 1. Transoral robotic resection and lymph node dissection on January 11, 2010. 2. Adjuvant chemoradiotherapy to a total dose of 60 Gy, completed     04/13/2010.  INTERVAL SINCE TREATMENT:  One and 1/2 years.  INTERVAL HISTORY:  Susan Evans reports for followup today.  She continues to have left shoulder pain for which she has taken Vicodin.  She has not again been scheduled or shown up for physical therapy appointments.  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  She has discussed this with her primary care physician.  She requests a refill on her pain medications.  She reports no difficulties in swallowing except that she has to cut her food very small.  She reports no soreness in her mouth.  PHYSICAL EXAMINATION:  Weight is 110 pounds, which is down 12 pounds from last time.  Blood pressure 157/75, pulse 89, temperature 97.7, her. She has no palpable cervical or supraclavicular adenopathy.  She has had dark tanning of her left neck and postsurgical change.  Examination of the oral cavity reveals top dentures in place.  No palpable or visible evidence of recurrent disease.  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  No evidence of disease.  RECOMMENDATIONS:  Ms. Taper has healed up nicely.  She was examined by Dr. Nicolette Bang last month and has a followup with Dr. Lamonte Sakai next week.  I will plan on seeing her back in 6 months.  I have given her a refill on Vicodin for 30 tablets and again referred her to physical therapy.  I asked her to wait for [place] to get her appointment today but she states she had to leave.  I have really encouraged her to follow up on these recommendations.    ______________________________ Thea Silversmith, M.D. SW/MEDQ  D:  09/26/2011  T:  09/26/2011  Job:  [id number]

## 2011-09-26 NOTE — Progress Notes (Signed)
Swallowing ok, eating regular foods.  No sore throat.  C/o left shoulder hurting rates 7/10.  Takes tylenol without relief

## 2011-09-27 ENCOUNTER — Other Ambulatory Visit: Payer: Self-pay | Admitting: Internal Medicine

## 2011-09-27 ENCOUNTER — Other Ambulatory Visit: Payer: Medicaid Other | Admitting: Lab

## 2011-09-27 ENCOUNTER — Inpatient Hospital Stay (HOSPITAL_COMMUNITY)
Admission: RE | Admit: 2011-09-27 | Discharge: 2011-09-27 | Payer: Medicaid Other | Source: Ambulatory Visit | Attending: Oncology | Admitting: Oncology

## 2011-09-27 ENCOUNTER — Inpatient Hospital Stay (HOSPITAL_COMMUNITY): Admission: RE | Admit: 2011-09-27 | Payer: Medicaid Other | Source: Ambulatory Visit

## 2011-09-30 ENCOUNTER — Other Ambulatory Visit: Payer: Self-pay | Admitting: Internal Medicine

## 2011-09-30 ENCOUNTER — Telehealth: Payer: Self-pay | Admitting: Oncology

## 2011-09-30 ENCOUNTER — Encounter: Payer: Medicaid Other | Admitting: Physician Assistant

## 2011-09-30 ENCOUNTER — Ambulatory Visit: Payer: Medicaid Other | Admitting: Physician Assistant

## 2011-09-30 NOTE — Telephone Encounter (Signed)
Pt's appt was scheduled for the wrong date per cameo to r/s the pt's appts

## 2011-10-01 NOTE — Progress Notes (Signed)
Pt did not have a planned scheduled  appt. No encounter has been performed on 1/7 therefor no charge is to be given to patient. Pt to reschedule.  This encounter was created in error - please disregard.

## 2011-10-05 ENCOUNTER — Other Ambulatory Visit: Payer: Self-pay | Admitting: Internal Medicine

## 2011-10-07 ENCOUNTER — Other Ambulatory Visit: Payer: Medicaid Other | Admitting: Lab

## 2011-10-07 ENCOUNTER — Telehealth: Payer: Self-pay | Admitting: Oncology

## 2011-10-07 ENCOUNTER — Ambulatory Visit: Payer: Medicaid Other | Admitting: Physician Assistant

## 2011-10-07 NOTE — Telephone Encounter (Signed)
pt called and r/s appt for 1-14 to 1-23

## 2011-10-08 ENCOUNTER — Ambulatory Visit: Payer: Medicaid Other | Admitting: Physical Therapy

## 2011-10-08 ENCOUNTER — Encounter: Payer: Self-pay | Admitting: *Deleted

## 2011-10-16 ENCOUNTER — Other Ambulatory Visit: Payer: Self-pay | Admitting: Oncology

## 2011-10-16 ENCOUNTER — Ambulatory Visit (HOSPITAL_BASED_OUTPATIENT_CLINIC_OR_DEPARTMENT_OTHER): Payer: Medicaid Other | Admitting: Oncology

## 2011-10-16 ENCOUNTER — Telehealth: Payer: Self-pay | Admitting: Oncology

## 2011-10-16 ENCOUNTER — Other Ambulatory Visit (HOSPITAL_BASED_OUTPATIENT_CLINIC_OR_DEPARTMENT_OTHER): Payer: Medicaid Other | Admitting: Lab

## 2011-10-16 VITALS — BP 119/80 | HR 102 | Temp 97.3°F | Ht 61.0 in | Wt 104.2 lb

## 2011-10-16 DIAGNOSIS — K121 Other forms of stomatitis: Secondary | ICD-10-CM

## 2011-10-16 DIAGNOSIS — C109 Malignant neoplasm of oropharynx, unspecified: Secondary | ICD-10-CM

## 2011-10-16 DIAGNOSIS — I10 Essential (primary) hypertension: Secondary | ICD-10-CM

## 2011-10-16 DIAGNOSIS — E46 Unspecified protein-calorie malnutrition: Secondary | ICD-10-CM

## 2011-10-16 DIAGNOSIS — Z85819 Personal history of malignant neoplasm of unspecified site of lip, oral cavity, and pharynx: Secondary | ICD-10-CM

## 2011-10-16 DIAGNOSIS — Z87891 Personal history of nicotine dependence: Secondary | ICD-10-CM

## 2011-10-16 DIAGNOSIS — E119 Type 2 diabetes mellitus without complications: Secondary | ICD-10-CM

## 2011-10-16 DIAGNOSIS — D649 Anemia, unspecified: Secondary | ICD-10-CM

## 2011-10-16 DIAGNOSIS — C140 Malignant neoplasm of pharynx, unspecified: Secondary | ICD-10-CM

## 2011-10-16 LAB — COMPREHENSIVE METABOLIC PANEL
ALT: 26 U/L (ref 0–35)
Alkaline Phosphatase: 107 U/L (ref 39–117)
Creatinine, Ser: 0.5 mg/dL (ref 0.50–1.10)
Glucose, Bld: 272 mg/dL — ABNORMAL HIGH (ref 70–99)
Sodium: 135 mEq/L (ref 135–145)
Total Bilirubin: 0.3 mg/dL (ref 0.3–1.2)
Total Protein: 6.6 g/dL (ref 6.0–8.3)

## 2011-10-16 LAB — CBC WITH DIFFERENTIAL/PLATELET
Basophils Absolute: 0 10*3/uL (ref 0.0–0.1)
HCT: 34 % — ABNORMAL LOW (ref 34.8–46.6)
HGB: 11.1 g/dL — ABNORMAL LOW (ref 11.6–15.9)
MONO#: 0.4 10*3/uL (ref 0.1–0.9)
NEUT#: 3.2 10*3/uL (ref 1.5–6.5)
NEUT%: 54.1 % (ref 38.4–76.8)
RDW: 13.5 % (ref 11.2–14.5)
WBC: 5.9 10*3/uL (ref 3.9–10.3)
lymph#: 2.2 10*3/uL (ref 0.9–3.3)

## 2011-10-16 LAB — IRON AND TIBC
%SAT: 18 % — ABNORMAL LOW (ref 20–55)
Iron: 50 ug/dL (ref 42–145)
UIBC: 228 ug/dL (ref 125–400)

## 2011-10-16 LAB — FERRITIN: Ferritin: 316 ng/mL — ABNORMAL HIGH (ref 10–291)

## 2011-10-16 NOTE — Telephone Encounter (Signed)
Gv pt appt for [id number]

## 2011-10-16 NOTE — Progress Notes (Signed)
[place] OFFICE PROGRESS NOTE  Cc:  Vertell Limber, MD, MD  DIAGNOSIS:   History of 0.5 cm left lateral pharyngeal wall invasive squamous cell carcinoma moderate to poorly differentiated tumor; 1/24 node positive with extracapsular extension; pT1 N2a Mx;  HPV negative.  PAST THERAPY:  Status post transoral robotic resections by da Vinci robot by Dr. Francina Ames on December 22, 2009.  She underwent submandibular gland excision, left neck dissections level 3 and 4 and left neck dissection 2A, 2B and 5.  She underwent adjuvant chemotherapy with weekly cisplatin and daily XRT between 02/23/10 and 04/13/10.  CURREN THERAPY:  watchful observation.  INTERVAL HISTORY: Susan Evans 56 y.o. female returns for regular follow up by herself.  She reports doing well.  She no longer smokes cigarettes.  She has mild-moderate xerostomia; however, she is able to eat different kinds of foods without restriction, aspiration, dysphagia, odynophagia.  She has fibrotic changes in bilateral neck for which she was already arranged for OT.  Patient denies fatigue, headache, visual changes, confusion, drenching night sweats, palpable lymph node swelling, mucositis, odynophagia, dysphagia, nausea vomiting, jaundice, chest pain, palpitation, shortness of breath, dyspnea on exertion, productive cough, gum bleeding, epistaxis, hematemesis, hemoptysis, abdominal pain, abdominal swelling, early satiety, melena, hematochezia, hematuria, skin rash, spontaneous bleeding, joint swelling, joint pain, heat or cold intolerance, bowel bladder incontinence, back pain, focal motor weakness, paresthesia, depression, suicidal or homocidal ideation, feeling hopelessness.   MEDICAL HISTORY: Past Medical History  Diagnosis Date  . Diabetes mellitus   . Hyperlipidemia   . Hypertension   . GERD (gastroesophageal reflux disease)   . Tobacco abuse   . Iron deficiency anemia   . Menopause   . Ganglion cyst     right wrist   . 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  MEDICATIONS: Current Outpatient Prescriptions  Medication Sig Dispense Refill  . ACCU-CHEK AVIVA PLUS test strip USE AS INSTRUCTED  100 strip  5  . amitriptyline (ELAVIL) 25 MG tablet Take 25 mg by mouth at bedtime.        Marland Kitchen amLODipine (NORVASC) 5 MG tablet TAKE 2 TABLETS BY MOUTH EVERY DAY  30 tablet  3  . aspirin 81 MG EC tablet Take 81 mg by mouth daily.        . B-D ULTRAFINE III SHORT PEN 31G X 8 MM MISC USE 3 TIMES DAILY WITH NOVOLOG FLEXPEN  100 each  11  . B-D ULTRAFINE III SHORT PEN 31G X 8 MM MISC USE 3 TIMES DAILY WITH NOVOLOG FLEXPEN  100 each  10  . Blood Glucose Monitoring Suppl (ACCU-CHEK AVIVA) kit Use as instructed       . HYDROcodone-acetaminophen (VICODIN) 5-500 MG per tablet Take 1 tablet by mouth every 6 (six) hours as needed for pain.  20 tablet  0  . insulin aspart (NOVOLOG FLEXPEN) 100 UNIT/ML injection Inject into the skin. 5 units with breakfast, 10 units with lunch, and 8 units with dinner       . Insulin Syringe-Needle U-100 (BD INSULIN SYRINGE ULTRAFINE) 31G X 5/16" 0.5 ML MISC Use as instructed to inject insulin sq.  100 each  11  . Lancets MISC Use to test blood sugar 4 times a day       . LANTUS 100 UNIT/ML injection INJECT 25 UNITS SUBCUTANEOUSLY IN THE MORNING AND 25 UNITS AT BEDTIME.  10 mL  12  . lisinopril (PRINIVIL,ZESTRIL)  40 MG tablet TAKE 1 TABLET BY MOUTH ONCE A DAY  31 tablet  10  . metoprolol tartrate (LOPRESSOR) 25 MG tablet TAKE 1 TABLET BY MOUTH TWICE A DAY  60 tablet  5  . omeprazole (PRILOSEC) 20 MG capsule TAKE ONE CAPSULE BY MOUTH EVERY DAY FOR ACID REFLUX SYMPTOMS  90 capsule  3  . simvastatin (ZOCOR) 20 MG tablet TAKE 1 TABLET (20 MG TOTAL) BY MOUTH DAILY.  30 tablet  3  . simvastatin (ZOCOR) 20 MG tablet Take 1 tablet (20 mg total) by mouth daily.  30 tablet  5  . traMADol (ULTRAM) 50 MG tablet Take 1 tablet (50 mg total) by  mouth every 6 (six) hours as needed for pain.  30 tablet  0  . potassium chloride (K-DUR) 10 MEQ tablet Take 2 tablets by mouth every 4 hours x 3 doses.  6 tablet  0    ALLERGIES:  is allergic to penicillins.  REVIEW OF SYSTEMS:  The rest of the 14-point review of system was negative.   Filed Vitals:   10/16/11 1007  BP: 119/80  Pulse: 102  Temp: 97.3 F (36.3 C)   Wt Readings from Last 3 Encounters:  10/16/11 104 lb 3.2 oz (47.265 kg)  05/02/11 121 lb 9.6 oz (55.157 kg)  09/30/11 109 lb 1.6 oz (49.487 kg)   ECOG Performance status: 0  PHYSICAL EXAMINATION:   General:  well-nourished in no acute distress.  Eyes:  no scleral icterus.  ENT:  There were no oropharyngeal lesions.  Neck was without thyromegaly.  Lymphatics:  Negative cervical, supraclavicular or axillary adenopathy.  Respiratory: lungs were clear bilaterally without wheezing or crackles.  Cardiovascular:  Regular rate and rhythm, S1/S2, without murmur, rub or gallop.  There was no pedal edema.  GI:  abdomen was soft, flat, nontender, nondistended, without organomegaly.  Rectal exam; normal rectal tone; brown stool, Guiac hem negative.  Muscoloskeletal:  no spinal tenderness of palpation of vertebral spine.  Skin exam was without echymosis, petichae.  Neuro exam was nonfocal.  Patient was able to get on and off exam table without assistance.  Gait was normal.  Patient was alerted and oriented.  Attention was good.   Language was appropriate.  Mood was normal without depression.  Speech was not pressured.  Thought content was not tangential.         LABORATORY/RADIOLOGY DATA:  Lab Results  Component Value Date   WBC 5.9 10/16/2011   HGB 11.1* 10/16/2011   HCT 34.0* 10/16/2011   PLT 224 10/16/2011   GLUCOSE 101* 05/06/2011   CHOL 156 05/06/2011   TRIG 176* 05/06/2011   HDL 44 05/06/2011   LDLCALC 77 05/06/2011   ALT 12 07/18/2010   AST 24 03/11/2011   NA 134* 05/06/2011   K 3.3* 05/06/2011   CL 93* 05/06/2011   CREATININE  0.58 05/06/2011   BUN 12 05/06/2011   CO2 29 05/06/2011   TSH 0.129* 03/11/2011   HGBA1C 8.7 07/24/2011   MICROALBUR 10.79* 04/03/2011     ASSESSMENT AND PLAN:   1. History of pharyngeal squamous cell carcinoma:  Per clinical history, physical exam, lab she has no evidence of disease recurrence.  As she is more than 1.5 year out, and no clinical symptoms, I will defer further neck CT.  She sees Dr. Nicolette Bang at least once or twice a year from [facility] for routine ENT followup.  2. History of smoking.  She no longer smokes.  I also urged her  not to resume smoking as it does slightly increase her risk of recurrent disease.   3. 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  There is no sign of active bleeding; there is no need for transfusion.  She said that her last colonoscopy was done around 2005 with [facility] and was reportedly negative.  I sent for iron panel today.  If she has sign of iron deficiency anemia, I will refer her back to [place] for evaluation to see whether repeat endoscopy is indicated at this time.  4. Diabetes mellitus type 2.  She is on insulin per PCP. 5. Hypertension. Under good control.   She is on amlodipine, lisinopril, metoprolol per PCP. 6. Hyperlipidemia.  She is on simvastatin per her PCP. 7. Primary care.  She said that she is up to date with her pap smear last year with her PCP which was reportedly negative.   Last mammogram 08/30/2010 was negative.  She missed mammogram in 08/2011.  [facility].  8. Follow up:  Lab/RV with me in about 6 months.  She also has follow up with ENT and Rad Onc.  The length of time of the face-to-face encounter was 15 minutes. More than 50% of time was spent counseling and coordination of care.

## 2011-10-17 ENCOUNTER — Other Ambulatory Visit: Payer: Self-pay | Admitting: Internal Medicine

## 2011-10-23 ENCOUNTER — Ambulatory Visit: Payer: Medicaid Other | Admitting: Oncology

## 2011-10-24 ENCOUNTER — Other Ambulatory Visit: Payer: Self-pay | Admitting: Internal Medicine

## 2011-11-05 ENCOUNTER — Ambulatory Visit (HOSPITAL_COMMUNITY): Payer: Medicaid Other | Attending: Internal Medicine

## 2011-12-11 ENCOUNTER — Encounter: Payer: Self-pay | Admitting: Internal Medicine

## 2011-12-11 ENCOUNTER — Ambulatory Visit (INDEPENDENT_AMBULATORY_CARE_PROVIDER_SITE_OTHER): Payer: Medicaid Other | Admitting: Internal Medicine

## 2011-12-11 VITALS — BP 121/76 | HR 84 | Temp 97.6°F | Ht 61.0 in | Wt 100.9 lb

## 2011-12-11 DIAGNOSIS — I10 Essential (primary) hypertension: Secondary | ICD-10-CM

## 2011-12-11 DIAGNOSIS — E119 Type 2 diabetes mellitus without complications: Secondary | ICD-10-CM

## 2011-12-11 DIAGNOSIS — M25519 Pain in unspecified shoulder: Secondary | ICD-10-CM

## 2011-12-11 DIAGNOSIS — Z79899 Other long term (current) drug therapy: Secondary | ICD-10-CM

## 2011-12-11 DIAGNOSIS — M25512 Pain in left shoulder: Secondary | ICD-10-CM

## 2011-12-11 DIAGNOSIS — E785 Hyperlipidemia, unspecified: Secondary | ICD-10-CM

## 2011-12-11 LAB — BASIC METABOLIC PANEL
BUN: 9 mg/dL (ref 6–23)
CO2: 23 mEq/L (ref 19–32)
Glucose, Bld: 199 mg/dL — ABNORMAL HIGH (ref 70–99)
Potassium: 3.7 mEq/L (ref 3.5–5.3)
Sodium: 133 mEq/L — ABNORMAL LOW (ref 135–145)

## 2011-12-11 LAB — POCT GLYCOSYLATED HEMOGLOBIN (HGB A1C): Hemoglobin A1C: 8.3

## 2011-12-11 MED ORDER — INSULIN GLARGINE 100 UNIT/ML ~~LOC~~ SOLN: SUBCUTANEOUS | Status: DC

## 2011-12-11 MED ORDER — HYDROCODONE-ACETAMINOPHEN 5-500 MG PO TABS: 1.0000 | ORAL_TABLET | Freq: Four times a day (QID) | ORAL | Status: DC | PRN

## 2011-12-11 NOTE — Patient Instructions (Signed)
Increase your Lantus to 30 units subcutaneously in the morning and 20 units in the evening

## 2011-12-11 NOTE — Assessment & Plan Note (Signed)
LDL is at goal.

## 2011-12-11 NOTE — Assessment & Plan Note (Signed)
Well controlled on current treatment, No new changes made today, Will continue to monitor.   

## 2011-12-11 NOTE — Assessment & Plan Note (Signed)
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

## 2011-12-11 NOTE — Progress Notes (Signed)
Patient ID: Susan Evans, female   DOB: 11-12-1955, 56 y.o.   MRN: [id number]  HPI:   Patient is a 56 year old woman with past medical history of diabetes, hypertension, malignancy of the neck status post chemotherapy/radiation/surgery per patient, presents today for routine followup. Brings in her meter, her CBGs ranged from 89-179, Today she denies new complaints. Reports that her shoulder pain is mild, and only worse with vigorous activity.  As for her cancer, she is followed by Dr. Suzan Garibaldi and Dr. Lamonte Sakai. She denies any muscle cramps, chest pain, shortness of breath, nausea or vomiting, fever or chills.   Review of Systems: Negative except per history of present illness  Physical Exam:  Nursing notes and vitals reviewed General:  alert, well-developed, and cooperative to examination.   Lungs:  normal respiratory effort, no accessory muscle use, normal breath sounds, no crackles, and no wheezes. Heart:  normal rate, regular rhythm, no murmurs, no gallop, and no rub.   Abdomen:  soft, non-tender, normal bowel sounds, no distention, no guarding, no rebound tenderness, no hepatomegaly, and no splenomegaly.   Extremities:  No cyanosis, clubbing, edema Neurologic:  alert & oriented X3, nonfocal exam  Meds: Medications Prior to Admission  Medication Sig Dispense Refill  . ACCU-CHEK AVIVA PLUS test strip USE AS INSTRUCTED  100 strip  5  . amitriptyline (ELAVIL) 25 MG tablet Take 25 mg by mouth at bedtime.        Marland Kitchen amLODipine (NORVASC) 5 MG tablet TAKE 2 TABLETS BY MOUTH EVERY DAY  30 tablet  3  . aspirin 81 MG EC tablet Take 81 mg by mouth daily.        . B-D ULTRAFINE III SHORT PEN 31G X 8 MM MISC USE 3 TIMES DAILY WITH NOVOLOG FLEXPEN  100 each  11  . B-D ULTRAFINE III SHORT PEN 31G X 8 MM MISC USE 3 TIMES DAILY WITH NOVOLOG FLEXPEN  100 each  10  . Blood Glucose Monitoring Suppl (ACCU-CHEK AVIVA) kit Use as instructed       . hydrochlorothiazide (HYDRODIURIL) 25 MG tablet TAKE 1 TABLET BY  MOUTH EVERY DAY  62 tablet  4  . insulin aspart (NOVOLOG FLEXPEN) 100 UNIT/ML injection Inject into the skin. 5 units with breakfast, 10 units with lunch, and 8 units with dinner       . Insulin Syringe-Needle U-100 (BD INSULIN SYRINGE ULTRAFINE) 31G X 5/16" 0.5 ML MISC Use as instructed to inject insulin sq.  100 each  11  . Lancets MISC Use to test blood sugar 4 times a day       . lisinopril (PRINIVIL,ZESTRIL) 40 MG tablet TAKE 1 TABLET BY MOUTH ONCE A DAY  31 tablet  10  . metoprolol tartrate (LOPRESSOR) 25 MG tablet TAKE 1 TABLET BY MOUTH TWICE A DAY  60 tablet  5  . omeprazole (PRILOSEC) 20 MG capsule TAKE ONE CAPSULE BY MOUTH EVERY DAY FOR ACID REFLUX SYMPTOMS  90 capsule  3  . potassium chloride (K-DUR) 10 MEQ tablet Take 2 tablets by mouth every 4 hours x 3 doses.  6 tablet  0  . simvastatin (ZOCOR) 20 MG tablet Take 1 tablet (20 mg total) by mouth daily.  30 tablet  5  . traMADol (ULTRAM) 50 MG tablet Take 1 tablet (50 mg total) by mouth every 6 (six) hours as needed for pain.  30 tablet  0   No current facility-administered medications on file as of 12/11/2011.    Allergies: Penicillins Past  Medical History  Diagnosis Date  . Diabetes mellitus   . Hyperlipidemia   . Hypertension   . GERD (gastroesophageal reflux disease)   . Tobacco abuse   . Iron deficiency anemia   . Menopause   . Ganglion cyst     right wrist  . 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 Family History  Problem Relation Age of Onset  . Stroke Father    History   Social History  . Marital Status: Single    Spouse Name: N/A    Number of Children: N/A  . Years of Education: N/A   Occupational History  . Not on file.   Social History Main Topics  . Smoking status: Former Smoker -- 0.0 packs/day    Quit date: 02/09/2009  . Smokeless tobacco: Not on file  . Alcohol Use: No  . Drug Use: No  . Sexually Active: Yes   Other Topics Concern  . Not on  file   Social History Narrative  . No narrative on file

## 2011-12-11 NOTE — Assessment & Plan Note (Signed)
She is up-to-date with her health maintenance related to diabetes, and is going for an eye exam next week. She is on an ACE inhibitor, her A1c today is 8.3, patient is on 25 units of Lantus in am and 20 units in pm times a day along with NovoLog 7 units TID after meals. Will plan to increase her AM lantus to 30 units and maintain evening dose to 20 units, and reassess her diabetic status is 3 months.

## 2011-12-22 ENCOUNTER — Other Ambulatory Visit: Payer: Self-pay | Admitting: Internal Medicine

## 2012-01-01 ENCOUNTER — Other Ambulatory Visit: Payer: Self-pay | Admitting: Dietician

## 2012-01-01 DIAGNOSIS — E119 Type 2 diabetes mellitus without complications: Secondary | ICD-10-CM

## 2012-01-01 MED ORDER — GLUCOSE BLOOD VI STRP: ORAL_STRIP | Status: DC

## 2012-01-01 MED ORDER — LANCETS MISC: Status: DC

## 2012-01-01 NOTE — Telephone Encounter (Signed)
Needs prescription with specific instructions for testing frequency for testing supplies- only getting 1 box currently

## 2012-01-31 ENCOUNTER — Other Ambulatory Visit: Payer: Self-pay | Admitting: Internal Medicine

## 2012-02-20 ENCOUNTER — Other Ambulatory Visit: Payer: Self-pay | Admitting: Internal Medicine

## 2012-02-27 ENCOUNTER — Other Ambulatory Visit: Payer: Self-pay | Admitting: *Deleted

## 2012-02-27 NOTE — Telephone Encounter (Signed)
Pt states Dr Suzan Garibaldi in [facility] ( ENT)  advised pt to take an extra omeprazole at night.

## 2012-02-28 MED ORDER — OMEPRAZOLE 20 MG PO CPDR: 20.0000 mg | DELAYED_RELEASE_CAPSULE | Freq: Two times a day (BID) | ORAL | Status: DC

## 2012-03-30 ENCOUNTER — Other Ambulatory Visit: Payer: Medicaid Other | Admitting: Lab

## 2012-03-30 ENCOUNTER — Ambulatory Visit: Payer: Medicaid Other | Admitting: Oncology

## 2012-04-02 ENCOUNTER — Telehealth: Payer: Self-pay | Admitting: Oncology

## 2012-04-02 NOTE — Telephone Encounter (Signed)
Pt came in appts made and printed for pt as pt missed 7/8 appt   aom

## 2012-04-06 ENCOUNTER — Ambulatory Visit (HOSPITAL_BASED_OUTPATIENT_CLINIC_OR_DEPARTMENT_OTHER): Payer: Medicaid Other | Admitting: Oncology

## 2012-04-06 ENCOUNTER — Other Ambulatory Visit: Payer: Medicaid Other | Admitting: Lab

## 2012-04-06 ENCOUNTER — Telehealth: Payer: Self-pay | Admitting: Oncology

## 2012-04-06 VITALS — BP 139/90 | HR 92 | Temp 98.3°F | Ht 61.0 in | Wt 100.7 lb

## 2012-04-06 DIAGNOSIS — D649 Anemia, unspecified: Secondary | ICD-10-CM

## 2012-04-06 DIAGNOSIS — C109 Malignant neoplasm of oropharynx, unspecified: Secondary | ICD-10-CM

## 2012-04-06 LAB — CBC WITH DIFFERENTIAL/PLATELET
Basophils Absolute: 0 10*3/uL (ref 0.0–0.1)
EOS%: 1 % (ref 0.0–7.0)
Eosinophils Absolute: 0.1 10*3/uL (ref 0.0–0.5)
HCT: 34.1 % — ABNORMAL LOW (ref 34.8–46.6)
HGB: 11 g/dL — ABNORMAL LOW (ref 11.6–15.9)
LYMPH%: 37 % (ref 14.0–49.7)
MCH: 22.8 pg — ABNORMAL LOW (ref 25.1–34.0)
MCV: 70.7 fL — ABNORMAL LOW (ref 79.5–101.0)
MONO%: 8.4 % (ref 0.0–14.0)
NEUT#: 4.4 10*3/uL (ref 1.5–6.5)
NEUT%: 53.5 % (ref 38.4–76.8)
Platelets: 181 10*3/uL (ref 145–400)
RDW: 14.3 % (ref 11.2–14.5)

## 2012-04-06 LAB — COMPREHENSIVE METABOLIC PANEL
AST: 24 U/L (ref 0–37)
Albumin: 3.8 g/dL (ref 3.5–5.2)
Alkaline Phosphatase: 138 U/L — ABNORMAL HIGH (ref 39–117)
BUN: 9 mg/dL (ref 6–23)
Creatinine, Ser: 0.53 mg/dL (ref 0.50–1.10)
Glucose, Bld: 256 mg/dL — ABNORMAL HIGH (ref 70–99)
Potassium: 4 mEq/L (ref 3.5–5.3)
Total Bilirubin: 0.2 mg/dL — ABNORMAL LOW (ref 0.3–1.2)

## 2012-04-06 NOTE — Telephone Encounter (Signed)
appts made and printed for pt aom °

## 2012-04-06 NOTE — Progress Notes (Signed)
[place] OFFICE PROGRESS NOTE  Cc:  Santa Lighter, MD  DIAGNOSIS:   History of 0.5 cm left lateral pharyngeal wall invasive squamous cell carcinoma moderate to poorly differentiated tumor; 1/24 node positive with extracapsular extension; pT1 N2a Mx;  HPV negative.  PAST THERAPY:  Status post transoral robotic resections by da Vinci robot by Dr. Francina Ames on December 22, 2009.  She underwent submandibular gland excision, left neck dissections level 3 and 4 and left neck dissection 2A, 2B and 5.  She underwent adjuvant chemotherapy with weekly cisplatin and daily XRT between 02/23/10 and 04/13/10.  CURREN THERAPY:  watchful observation.  INTERVAL HISTORY: Susan Evans 56 y.o. female returns for regular follow up by herself.  She reports doing well. She still has mild to moderate xerostomia; however, she is taking foods orally without dysphagia, odynophagia, mucositis.  She does not want medication for xerostomia. She denied neck swelling or palpable enlarged neck node.  She has nasal drainage and nonproductive cough the past week.  She denied fever, SOB, chest pain, DOE.  She has intermittent weight gain/loss.  Her appetite is still normal.   Patient denies fever, fatigue, headache, visual changes, confusion, drenching night sweats, palpable lymph node swelling, mucositis, odynophagia, dysphagia, nausea vomiting, jaundice, chest pain, palpitation, gum bleeding, epistaxis, hematemesis, hemoptysis, abdominal pain, abdominal swelling, early satiety, melena, hematochezia, hematuria, skin rash, spontaneous bleeding, joint swelling, joint pain, heat or cold intolerance, bowel bladder incontinence, back pain, focal motor weakness, paresthesia, depression, suicidal or homocidal ideation, feeling hopelessness.   MEDICAL HISTORY: Past Medical History  Diagnosis Date  . Diabetes mellitus   . Hyperlipidemia   . Hypertension   . GERD (gastroesophageal reflux disease)   . Tobacco abuse     . Iron deficiency anemia   . Menopause   . Ganglion cyst     right wrist  . 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  MEDICATIONS: Current Outpatient Prescriptions  Medication Sig Dispense Refill  . amitriptyline (ELAVIL) 25 MG tablet Take 25 mg by mouth at bedtime.        Marland Kitchen amLODipine (NORVASC) 5 MG tablet TAKE 2 TABLETS BY MOUTH EVERY DAY  30 tablet  3  . aspirin 81 MG EC tablet Take 81 mg by mouth daily.        . B-D ULTRAFINE III SHORT PEN 31G X 8 MM MISC USE 3 TIMES DAILY WITH NOVOLOG FLEXPEN  100 each  10  . Blood Glucose Monitoring Suppl (ACCU-CHEK AVIVA) kit Use as instructed       . glucose blood (ACCU-CHEK AVIVA PLUS) test strip Use to check blood sugar 4 times a day  150 each  12  . hydrochlorothiazide (HYDRODIURIL) 25 MG tablet TAKE 1 TABLET BY MOUTH EVERY DAY  62 tablet  4  . HYDROcodone-acetaminophen (VICODIN) 5-500 MG per tablet Take 1 tablet by mouth every 6 (six) hours as needed for pain.  20 tablet  0  . insulin aspart (NOVOLOG FLEXPEN) 100 UNIT/ML injection Inject into the skin. 5 units with breakfast, 10 units with lunch, and 8 units with dinner       . insulin glargine (LANTUS) 100 UNIT/ML injection Take 30 units subcutaneously in the morning and 20 units in the evening  10 mL  12  . Insulin Syringe-Needle U-100 (BD INSULIN SYRINGE ULTRAFINE) 31G X 5/16" 0.5 ML MISC Use as instructed to inject insulin sq.  100 each  11  . Lancets MISC Use to test blood sugar 4 times a day  150 each  12  . lisinopril (PRINIVIL,ZESTRIL) 40 MG tablet TAKE 1 TABLET BY MOUTH ONCE A DAY  31 tablet  1  . metoprolol tartrate (LOPRESSOR) 25 MG tablet TAKE 1 TABLET BY MOUTH TWICE A DAY  60 tablet  5  . omeprazole (PRILOSEC) 20 MG capsule Take 1 capsule (20 mg total) by mouth 2 (two) times daily.  60 capsule  1  . simvastatin (ZOCOR) 20 MG tablet TAKE 1 TABLET (20 MG TOTAL) BY MOUTH DAILY.  30 tablet  5  . traMADol (ULTRAM) 50  MG tablet Take 1 tablet (50 mg total) by mouth every 6 (six) hours as needed for pain.  30 tablet  0    ALLERGIES:  is allergic to penicillins.  REVIEW OF SYSTEMS:  The rest of the 14-point review of system was negative.   Filed Vitals:   04/06/12 1429  BP: 139/90  Pulse: 92  Temp: 98.3 F (36.8 C)   Wt Readings from Last 3 Encounters:  04/06/12 100 lb 11.2 oz (45.677 kg)  12/11/11 100 lb 14.4 oz (45.768 kg)  10/16/11 104 lb 3.2 oz (47.265 kg)   ECOG Performance status: 0-1  PHYSICAL EXAMINATION:   General:  Thin-appearing woman, in no acute distress.  Eyes:  no scleral icterus.  ENT:  There were no oropharyngeal lesions.  Neck was without thyromegaly.  Lymphatics:  Negative cervical, supraclavicular or axillary adenopathy.  Respiratory: lungs were clear bilaterally without wheezing or crackles.  Cardiovascular:  Regular rate and rhythm, S1/S2, without murmur, rub or gallop.  There was no pedal edema.  GI:  abdomen was soft, flat, nontender, nondistended, without organomegaly.  Rectal exam; normal rectal tone; brown stool, Guiac hem negative.  Muscoloskeletal:  no spinal tenderness of palpation of vertebral spine.  Skin exam was without echymosis, petichae.  Neuro exam was nonfocal.  Patient was able to get on and off exam table without assistance.  Gait was normal.  Patient was alerted and oriented.  Attention was good.   Language was appropriate.  Mood was normal without depression.  Speech was not pressured.  Thought content was not tangential.    LABORATORY/RADIOLOGY DATA:  Lab Results  Component Value Date   WBC 8.2 04/06/2012   HGB 11.0* 04/06/2012   HCT 34.1* 04/06/2012   PLT 181 04/06/2012   GLUCOSE 199* 12/11/2011   CHOL 156 05/06/2011   TRIG 176* 05/06/2011   HDL 44 05/06/2011   LDLCALC 77 05/06/2011   ALT 26 10/16/2011   AST 27 10/16/2011   NA 133* 12/11/2011   K 3.7 12/11/2011   CL 97 12/11/2011   CREATININE 0.47* 12/11/2011   BUN 9 12/11/2011   CO2 23 12/11/2011   TSH 0.129*  03/11/2011   HGBA1C 8.3 12/11/2011   MICROALBUR 10.79* 04/03/2011     ASSESSMENT AND PLAN:   1. History of pharyngeal squamous cell carcinoma:  Per clinical history, physical exam, lab she has no evidence of disease recurrence.  She still sees Dr. Nicolette Bang twice a year at [facility] for routine ENT followup.  2. History of smoking.  She no longer smokes at this time.  3. 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  There is no sign of active bleeding.  This is stable.  Her last colonoscopy with [facility] in 2007 was reportedly negative per patient's report.  She was told that the next surveillance coloscopy  is is 2017.  4. Diabetes mellitus type 2.  She is on insulin per PCP. 5. Hypertension. Under good control.   She is on amlodipine, HCTZ, lisinopril, metoprolol per PCP. 6. Hyperlipidemia.  She is on simvastatin per her PCP. 7. Follow up:  Lab/RV with me in about 6 months.  She also has follow up with ENT and Rad Onc.

## 2012-04-07 ENCOUNTER — Telehealth: Payer: Self-pay | Admitting: Oncology

## 2012-04-07 NOTE — Telephone Encounter (Signed)
I left a message on pt's home phone.  Her TSH was suppressed; indicating hyperthyroid (consistent with her recent presentation of weight loss).  Normally patients with radiation for head/neck cancer develop hypothyroidism.  She is not on Synthroid as far as I can tell.  Therefore, her suppressed TSH may indicate hyperthyroidism.  I advised her to follow up with her PCP, Dr. Santa Lighter to see if appropriate work up can be performed.

## 2012-04-08 ENCOUNTER — Ambulatory Visit (HOSPITAL_COMMUNITY)
Admission: RE | Admit: 2012-04-08 | Discharge: 2012-04-08 | Disposition: A | Discharge status: Home / Self Care | Payer: Medicaid Other | Source: Ambulatory Visit | Attending: Internal Medicine | Admitting: Internal Medicine

## 2012-04-08 DIAGNOSIS — Z1231 Encounter for screening mammogram for malignant neoplasm of breast: Secondary | ICD-10-CM | POA: Insufficient documentation

## 2012-04-09 ENCOUNTER — Other Ambulatory Visit: Payer: Self-pay | Admitting: Internal Medicine

## 2012-04-10 ENCOUNTER — Ambulatory Visit (INDEPENDENT_AMBULATORY_CARE_PROVIDER_SITE_OTHER): Payer: Medicaid Other | Admitting: Internal Medicine

## 2012-04-10 ENCOUNTER — Encounter: Payer: Self-pay | Admitting: Internal Medicine

## 2012-04-10 VITALS — BP 137/74 | HR 94 | Temp 98.8°F | Ht 61.0 in | Wt 100.5 lb

## 2012-04-10 DIAGNOSIS — R7989 Other specified abnormal findings of blood chemistry: Secondary | ICD-10-CM

## 2012-04-10 DIAGNOSIS — D179 Benign lipomatous neoplasm, unspecified: Secondary | ICD-10-CM

## 2012-04-10 DIAGNOSIS — E05 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: Secondary | ICD-10-CM | POA: Insufficient documentation

## 2012-04-10 DIAGNOSIS — M25519 Pain in unspecified shoulder: Secondary | ICD-10-CM

## 2012-04-10 DIAGNOSIS — I10 Essential (primary) hypertension: Secondary | ICD-10-CM

## 2012-04-10 LAB — T4, FREE: Free T4: 2.45 ng/dL — ABNORMAL HIGH (ref 0.80–1.80)

## 2012-04-10 LAB — T3, FREE: T3, Free: 10.2 pg/mL — ABNORMAL HIGH (ref 2.3–4.2)

## 2012-04-10 LAB — TSH: TSH: 0.009 u[IU]/mL — ABNORMAL LOW (ref 0.350–4.500)

## 2012-04-10 MED ORDER — TRAMADOL HCL 50 MG PO TABS: 50.0000 mg | ORAL_TABLET | Freq: Four times a day (QID) | ORAL | Status: DC | PRN

## 2012-04-10 NOTE — Progress Notes (Signed)
Patient ID: Susan Evans, female   DOB: 1956/01/08, 56 y.o.   MRN: [id number]  Subjective:   Patient ID: Susan Evans female   DOB: 05-24-1956 56 y.o.   MRN: [id number]  HPI: Susan Evans is a 56 y.o. with a past medical history as outlined below, who presents for an acute visit.   Patient reports that she saw her oncologist Dr. Lamonte Sakai on 04/06/12. She was told that she may have thyroid problem by Dr. Lamonte Sakai. That is why she comes here for further evaluation today. Her recent lab test showed that her TSH was 0.009 on 04/06/12. She reports that sometimes she feels hot;  She has occasional palpitation; she has very good appetite; she is easily agitated; she lost more than 20 pounds in last year. She does not have loose stool, sweating, dry skin or dry hairs.  Of note, patient was diagnosed as left lateral pharyngeal squamous cell cancer in 2011. She completed surgery, chemotherapy and radiation therapy. She has been followed up by her oncologist, Dr.Ha. Last visit was at 04/06/12. Patient was told her cancer was stable.   Patient also reported that she has a mass at left shoulder. She was evaluated by a surgeon in the past, and was told that her shoulder mass was most likely a fatty tumor. She was offered to do surgery, but she refused it. She does not want to have any surgery. Currently she has mild pain over her left shoulder. She wanted to have some pain medications.  Denies fever, chills, fatigue, headaches,  cough, chest pain, SOB,  abdominal pain,diarrhea,  dysuria, urgency, frequency and hematuria.   Past Medical History  Diagnosis Date  . Diabetes mellitus   . Hyperlipidemia   . Hypertension   . GERD (gastroesophageal reflux disease)   . Tobacco abuse   . Iron deficiency anemia   . Menopause   . Ganglion cyst     right wrist  . 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 amitriptyline (ELAVIL) 25  MG tablet Take 25 mg by mouth at bedtime.        Marland Kitchen amLODipine (NORVASC) 5 MG tablet TAKE 2 TABLETS BY MOUTH EVERY DAY  30 tablet  3  . aspirin 81 MG EC tablet Take 81 mg by mouth daily.        . B-D ULTRAFINE III SHORT PEN 31G X 8 MM MISC USE 3 TIMES DAILY WITH NOVOLOG FLEXPEN  100 each  10  . Blood Glucose Monitoring Suppl (ACCU-CHEK AVIVA) kit Use as instructed       . glucose blood (ACCU-CHEK AVIVA PLUS) test strip Use to check blood sugar 4 times a day  150 each  12  . hydrochlorothiazide (HYDRODIURIL) 25 MG tablet TAKE 1 TABLET BY MOUTH EVERY DAY  62 tablet  4  . HYDROcodone-acetaminophen (VICODIN) 5-500 MG per tablet Take 1 tablet by mouth every 6 (six) hours as needed for pain.  20 tablet  0  . insulin aspart (NOVOLOG FLEXPEN) 100 UNIT/ML injection Inject into the skin. 5 units with breakfast, 10 units with lunch, and 8 units with dinner       . insulin glargine (LANTUS) 100 UNIT/ML injection Take 30 units subcutaneously in the morning and 20 units in the evening  10 mL  12  . Insulin Syringe-Needle U-100 (BD INSULIN SYRINGE ULTRAFINE) 31G X 5/16" 0.5 ML MISC Use as instructed to inject insulin sq.  100 each  11  . Lancets MISC Use to test blood sugar 4 times a day  150 each  12  . lisinopril (PRINIVIL,ZESTRIL) 40 MG tablet TAKE 1 TABLET BY MOUTH ONCE A DAY  31 tablet  1  . metoprolol tartrate (LOPRESSOR) 25 MG tablet TAKE 1 TABLET BY MOUTH TWICE A DAY  60 tablet  1  . omeprazole (PRILOSEC) 20 MG capsule Take 1 capsule (20 mg total) by mouth 2 (two) times daily.  60 capsule  1  . simvastatin (ZOCOR) 20 MG tablet TAKE 1 TABLET (20 MG TOTAL) BY MOUTH DAILY.  30 tablet  5  . traMADol (ULTRAM) 50 MG tablet Take 1 tablet (50 mg total) by mouth every 6 (six) hours as needed for pain.  30 tablet  0   Family History  Problem Relation Age of Onset  . Stroke Father    History   Social History  . Marital Status: Single    Spouse Name: N/A    Number of Children: N/A  . Years of Education: N/A     Social History Main Topics  . Smoking status: Former Smoker -- 0.0 packs/day    Quit date: 02/09/2009  . Smokeless tobacco: None  . Alcohol Use: No  . Drug Use: No  . Sexually Active: Yes   Other Topics Concern  . None   Social History Narrative  . None   Review of Systems:  General: no fevers, chills,. Decreased body weight, no changes in appetite Skin: no rash HEENT: no blurry vision, hearing changes or sore throat Pulm: no dyspnea, coughing, wheezing CV: no chest pain, palpitations, shortness of breath Abd: no nausea/vomiting, abdominal pain, diarrhea/constipation GU: no dysuria, hematuria, polyuria Ext: no arthralgias, myalgias Neuro: no weakness, numbness, or tingling   Objective:  Physical Exam: Filed Vitals:   04/10/12 1451  BP: 137/74  Pulse: 94  Temp: 98.8 F (37.1 C)  TempSrc: Oral  Height: 5\' 1"  (1.549 m)  Weight: 100 lb 8 oz (45.587 kg)  SpO2: 100%   General: resting in bed, not in acute distress HEENT: PERRL, EOMI, no scleral icterus. There is no mass over her thyroid gland. There is no tenderness over her thyroid gland. Cardiac: S1/S2, RRR, No murmurs, gallops or rubs Pulm: Good air movement bilaterally, Clear to auscultation bilaterally, No rales, wheezing, rhonchi or rubs. Abd: Soft,  nondistended, nontender, no rebound pain, no organomegaly, BS present Ext: No rashes or edema, 2+DP/PT pulse bilaterally. There is a mass over her left shoulder, 5 X 5 cm in size. Soft and moveable. Mild tender.  Musculoskeletal: No joint deformities, erythema, or stiffness, ROM full and nontender Skin: no rashes. No skin bruise. Neuro: alert and oriented X3, cranial nerves II-XII grossly intact, muscle strength 5/5 in all extremeties,  sensation to light touch intact.  Psych.: patient is not psychotic, no suicidal or hemocidal ideation.   Assessment & Plan:

## 2012-04-10 NOTE — Assessment & Plan Note (Signed)
It is well controlled. Today blood pressure is 137/74. We'll continue current regimen.

## 2012-04-10 NOTE — Assessment & Plan Note (Addendum)
The etiology for decreased TSH level is not clear now. This decreased TSH level could be either from hyperthyroidism (such as Graves' disease) or from thyroiditis. Patient has some symptoms of hyperthyroidism. We'll check free T4 and free T3. We'll followup in one week. If she has increased free T4 or T3, she may need a thyroid scan test.

## 2012-04-10 NOTE — Patient Instructions (Signed)
1. Please take all medications as prescribed.  3. If you have worsening of your symptoms or new symptoms arise, please call the clinic [phone]), or go to the ER immediately if symptoms are severe.

## 2012-04-10 NOTE — Telephone Encounter (Signed)
Approved one month refill. Needs to be seen in the next month.

## 2012-04-10 NOTE — Assessment & Plan Note (Addendum)
Patient left shoulder mass is most likely due to lipoma. Patient refused surgery in the past. She still refuses any surgical evaluation today. Will treat her symptomatically with tramadol for her shoulder pain.

## 2012-04-15 NOTE — Telephone Encounter (Signed)
Called to pharm 

## 2012-04-17 ENCOUNTER — Ambulatory Visit (INDEPENDENT_AMBULATORY_CARE_PROVIDER_SITE_OTHER): Payer: Medicaid Other | Admitting: Internal Medicine

## 2012-04-17 ENCOUNTER — Encounter: Payer: Self-pay | Admitting: Internal Medicine

## 2012-04-17 VITALS — BP 117/77 | HR 87 | Temp 99.2°F | Ht 61.0 in | Wt 100.0 lb

## 2012-04-17 DIAGNOSIS — E0590 Thyrotoxicosis, unspecified without thyrotoxic crisis or storm: Secondary | ICD-10-CM | POA: Insufficient documentation

## 2012-04-17 NOTE — Assessment & Plan Note (Signed)
Patient symptoms and lab test results are consistent with hyperthyroidism. The next step is to find out was the underlying causes, which may include Graves' disease, toxic goiter or transient hyperthyroidism from thyroiditis. Since patient has history of pharyngeal squamous cell cancer and she is s/p of chemotherapy, radiation and surgical treatment, her condition is more complicated. I discussed with endocrinologist, Dr. Buddy Duty, who would like to see patient in his clinic. Dr. Buddy Duty suggested to do a thyroid stimulating immunoglobin test. We'll give her referral to endocrinology. Currently patient is on metoprolol 25 mg twice a day. Her heart rate is 92. We'll continue her current regimen.

## 2012-04-17 NOTE — Patient Instructions (Signed)
1. Please take all medications as prescribed.  3. If you have worsening of your symptoms or new symptoms arise, please call the clinic [phone]), or go to the ER immediately if symptoms are severe.  You have done great job in taking all your medications. I appreciate it very much. Please continue doing that.  Hyperthyroidism The thyroid is a large gland located in the lower front part of your neck. The thyroid helps control metabolism. Metabolism is how your body uses food. It controls metabolism with the hormone thyroxine. When the thyroid is overactive, it produces too much hormone. When this happens, these following problems may occur:   Nervousness   Heat intolerance   Weight loss (in spite of increase food intake)   Diarrhea   Change in hair or skin texture   Palpitations (heart skipping or having extra beats)   Tachycardia (rapid heart rate)   Loss of menstruation (amenorrhea)   Shaking of the hands  CAUSES  Grave's Disease (the immune system attacks the thyroid gland). This is the most common cause.   Inflammation of the thyroid gland.   Tumor (usually benign) in the thyroid gland or elsewhere.   Excessive use of thyroid medications (both prescription and 'natural').   Excessive ingestion of Iodine.  DIAGNOSIS  To prove hyperthyroidism, your caregiver may do blood tests and ultrasound tests. Sometimes the signs are hidden. It may be necessary for your caregiver to watch this illness with blood tests, either before or after diagnosis and treatment. TREATMENT Short-term treatment There are several treatments to control symptoms. Drugs called beta blockers may give some relief. Drugs that decrease hormone production will provide temporary relief in many people. These measures will usually not give permanent relief. Definitive therapy There are treatments available which can be discussed between you and your caregiver which will permanently treat the problem. These  treatments range from surgery (removal of the thyroid), to the use of radioactive iodine (destroys the thyroid by radiation), to the use of antithyroid drugs (interfere with hormone synthesis). The first two treatments are permanent and usually successful. They most often require hormone replacement therapy for life. This is because it is impossible to remove or destroy the exact amount of thyroid required to make a person euthyroid (normal). HOME CARE INSTRUCTIONS  See your caregiver if the problems you are being treated for get worse. Examples of this would be the problems listed above. SEEK MEDICAL CARE IF: Your general condition worsens. MAKE SURE YOU:   Understand these instructions.   Will watch your condition.   Will get help right away if you are not doing well or get worse.  Document Released: 09/09/2005 Document Revised: 08/29/2011 Document Reviewed: 01/21/2007 [facility] Patient Information 2012 [place].

## 2012-04-17 NOTE — Progress Notes (Signed)
Patient ID: Susan Evans, female   DOB: 04/25/1956, 56 y.o.   MRN: [id number]  Subjective:   Patient ID: Susan Evans female   DOB: [birth year] 56 y.o.   MRN: [id number]  HPI: Susan Evans is a 56 y.o. with a past medical history as outlined below, who presents for a followup visit.  Patient was seen at clinic by me at 04/10/12 because of possible hyperthyroidism. Patient was found by her oncologist, Dr. Lamonte Sakai to have decreased TSH on 04/06/12. She was suggested to come to clinic for further evaluation by Dr. Lamonte Sakai. She reports that sometimes she feels hot;  She has occasional palpitation; she is easily agitated; she lost more than 20 pounds in last year. She does not have loose stool, sweating, dry skin or dry hairs.  In her previous visit, we repeated TSH and ordered  free T4 and free T3 tests. Today she comes back for discussing her lab test results. The results showed that her TSH was 0.009; free T4 was 2.45 and free T3 was 10.2.   Denies fever, chills, fatigue, headaches,  cough, chest pain, SOB,  abdominal pain,diarrhea,  dysuria, urgency, frequency and hematuria.    Past Medical History  Diagnosis Date  . Diabetes mellitus   . Hyperlipidemia   . Hypertension   . GERD (gastroesophageal reflux disease)   . Tobacco abuse   . Iron deficiency anemia   . Menopause   . Ganglion cyst     right wrist  . 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 amitriptyline (ELAVIL) 25 MG tablet Take 25 mg by mouth at bedtime.        Marland Kitchen amLODipine (NORVASC) 5 MG tablet TAKE 2 TABLETS BY MOUTH EVERY DAY  30 tablet  3  . aspirin 81 MG EC tablet Take 81 mg by mouth daily.        . B-D ULTRAFINE III SHORT PEN 31G X 8 MM MISC USE 3 TIMES DAILY WITH NOVOLOG FLEXPEN  100 each  10  . Blood Glucose Monitoring Suppl (ACCU-CHEK AVIVA) kit Use as instructed       . glucose blood (ACCU-CHEK AVIVA PLUS) test strip Use to check  blood sugar 4 times a day  150 each  12  . hydrochlorothiazide (HYDRODIURIL) 25 MG tablet TAKE 1 TABLET BY MOUTH EVERY DAY  62 tablet  4  . HYDROcodone-acetaminophen (VICODIN) 5-500 MG per tablet Take 1 tablet by mouth every 6 (six) hours as needed for pain.  20 tablet  0  . insulin aspart (NOVOLOG FLEXPEN) 100 UNIT/ML injection Inject into the skin. 5 units with breakfast, 10 units with lunch, and 8 units with dinner       . insulin glargine (LANTUS) 100 UNIT/ML injection Take 30 units subcutaneously in the morning and 20 units in the evening  10 mL  12  . Insulin Syringe-Needle U-100 (BD INSULIN SYRINGE ULTRAFINE) 31G X 5/16" 0.5 ML MISC Use as instructed to inject insulin sq.  100 each  11  . Lancets MISC Use to test blood sugar 4 times a day  150 each  12  . lisinopril (PRINIVIL,ZESTRIL) 40 MG tablet TAKE 1 TABLET BY MOUTH ONCE A DAY  31 tablet  1  . metoprolol tartrate (LOPRESSOR) 25 MG tablet TAKE 1 TABLET BY MOUTH TWICE A DAY  60 tablet  1  . omeprazole (PRILOSEC) 20 MG capsule Take 1 capsule (20 mg total)  by mouth 2 (two) times daily.  60 capsule  1  . simvastatin (ZOCOR) 20 MG tablet TAKE 1 TABLET (20 MG TOTAL) BY MOUTH DAILY.  30 tablet  5  . traMADol (ULTRAM) 50 MG tablet Take 1 tablet (50 mg total) by mouth every 6 (six) hours as needed for pain.  30 tablet  0   Family History  Problem Relation Age of Onset  . Stroke Father    History   Social History  . Marital Status: Single    Spouse Name: N/A    Number of Children: N/A  . Years of Education: N/A   Social History Main Topics  . Smoking status: Former Smoker -- 0.0 packs/day    Quit date: 02/09/2009  . Smokeless tobacco: None  . Alcohol Use: No  . Drug Use: No  . Sexually Active: Yes   Other Topics Concern  . None   Social History Narrative  . None   Review of Systems:  General: no fevers, chills,. Decreased body weight, no changes in appetite Skin: no rash HEENT: no blurry vision, hearing changes or sore  throat Pulm: no dyspnea, coughing, wheezing CV: no chest pain, palpitations, shortness of breath Abd: no nausea/vomiting, abdominal pain, diarrhea/constipation GU: no dysuria, hematuria, polyuria Ext: no arthralgias, myalgias Neuro: no weakness, numbness, or tingling  Objective:  Physical Exam: Filed Vitals:   04/17/12 1030  BP: 117/77  Pulse: 87  Temp: 99.2 F (37.3 C)  TempSrc: Oral  Height: 5\' 1"  (1.549 m)  Weight: 100 lb (45.36 kg)  SpO2: 100%   General: resting in bed, not in acute distress HEENT: PERRL, EOMI, no scleral icterus. There is no mass over her thyroid gland. There is no tenderness over her thyroid gland. Cardiac: S1/S2, RRR, No murmurs, gallops or rubs Pulm: Good air movement bilaterally, Clear to auscultation bilaterally, No rales, wheezing, rhonchi or rubs. Abd: Soft,  nondistended, nontender, no rebound pain, no organomegaly, BS present Ext: No rashes or edema, 2+DP/PT pulse bilaterally. There is a mass over her left shoulder, 5 X 5 cm in size. Soft and moveable. Mild tender.  Musculoskeletal: No joint deformities, erythema, or stiffness, ROM full and nontender Skin: no rashes. No skin bruise. Neuro: alert and oriented X3, cranial nerves II-XII grossly intact, muscle strength 5/5 in all extremeties,  sensation to light touch intact.   Psych.: patient is not psychotic, no suicidal or hemocidal ideation   Assessment & Plan:

## 2012-04-20 ENCOUNTER — Telehealth: Payer: Self-pay | Admitting: *Deleted

## 2012-04-20 NOTE — Telephone Encounter (Signed)
Pt called says she was referred to Thyroid Dr. Buddy Duty by her PCP and she needs [place] to notify Dr. Oswaldo Milian in [facility] of referral.  Instructed her to call PCP at [facility] as they made the referral. Instructed to call us back if she needs any records from [place] sent over to Dr. Buddy Duty.  She verbalized understanding.

## 2012-04-21 ENCOUNTER — Other Ambulatory Visit: Payer: Self-pay | Admitting: Internal Medicine

## 2012-04-21 DIAGNOSIS — I10 Essential (primary) hypertension: Secondary | ICD-10-CM

## 2012-05-03 ENCOUNTER — Other Ambulatory Visit: Payer: Self-pay | Admitting: Internal Medicine

## 2012-05-18 ENCOUNTER — Encounter: Payer: Medicaid Other | Admitting: Internal Medicine

## 2012-05-19 ENCOUNTER — Other Ambulatory Visit: Payer: Self-pay | Admitting: Internal Medicine

## 2012-05-20 ENCOUNTER — Encounter: Payer: Self-pay | Admitting: Internal Medicine

## 2012-05-20 ENCOUNTER — Ambulatory Visit (INDEPENDENT_AMBULATORY_CARE_PROVIDER_SITE_OTHER): Payer: Medicaid Other | Admitting: Internal Medicine

## 2012-05-20 VITALS — BP 127/82 | HR 91 | Temp 97.1°F | Ht 60.0 in | Wt 101.4 lb

## 2012-05-20 DIAGNOSIS — E785 Hyperlipidemia, unspecified: Secondary | ICD-10-CM

## 2012-05-20 DIAGNOSIS — E05 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: Secondary | ICD-10-CM

## 2012-05-20 DIAGNOSIS — E119 Type 2 diabetes mellitus without complications: Secondary | ICD-10-CM

## 2012-05-20 DIAGNOSIS — M25512 Pain in left shoulder: Secondary | ICD-10-CM

## 2012-05-20 DIAGNOSIS — Z79899 Other long term (current) drug therapy: Secondary | ICD-10-CM

## 2012-05-20 DIAGNOSIS — M25519 Pain in unspecified shoulder: Secondary | ICD-10-CM

## 2012-05-20 LAB — GLUCOSE, CAPILLARY: Glucose-Capillary: 323 mg/dL — ABNORMAL HIGH (ref 70–99)

## 2012-05-20 LAB — LIPID PANEL
Cholesterol: 137 mg/dL (ref 0–200)
Total CHOL/HDL Ratio: 3.3 Ratio

## 2012-05-20 LAB — POCT GLYCOSYLATED HEMOGLOBIN (HGB A1C): Hemoglobin A1C: 7.9

## 2012-05-20 MED ORDER — HYDROCODONE-ACETAMINOPHEN 5-500 MG PO TABS: 1.0000 | ORAL_TABLET | Freq: Four times a day (QID) | ORAL | Status: DC | PRN

## 2012-05-20 NOTE — Progress Notes (Signed)
agree

## 2012-05-20 NOTE — Patient Instructions (Signed)
It was nice to me today.  We did discuss your recent lab results regarding her thyroid and followed up on your recent endocrinology visit with Dr. Buddy Duty. We are requesting to have those records sent to the clinic at this time. We encourage you to continue following up with him especially at your next appointment on 06/03/12.  In terms of your diabetes continue with your current medications as we did not make any new changes. We recommend following up with your regular primary care doctor to discuss management.  We are very sorry for your recent loss and the current workup and you have to be going through at this time. Please note that services are available to you at any time you feel you need to talk to somebody or discuss what is going on.  Best of luck with everything and if you have any questions do not hesitate to call the clinic at [phone].  Thank you.

## 2012-05-20 NOTE — Progress Notes (Signed)
Subjective:   Patient ID: Susan Evans female   DOB: 06/27/1956 56 y.o.   MRN: [id number]  HPI: [name] is a 56 y.o. 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 She comes in for an acute visit to followup on recent labs and some ongoing left shoulder pain. A discussion regarding her elevated TSI that was drawn at her last visit and followup from her endocrinology referral. Patient states that she was started on some medication she believes is methimazole and that some lab work was performed and she has a followup visit in endocrinology on 06/03/12. She is only aware that she is not a surgical candidate given the previous surgery she's had for her pharyngeal carcinoma. Her symptoms have otherwise improved since that time with no palpitations, no diarrhea, no weight loss, and no feelings of anxiety. In terms of her shoulder pain she says it has been going on for about a month and she was seen in clinic on 04/10/12 and given some tramadol to relieve the pain and a physical therapy referral. She's not been able to make a referral appointment yet given the workup into her newly diagnosed Graves' disease. She says the pain is only worse at night and she has pretty good range of motion in that shoulder and she attributed the pain to a chronic lipoma that she's having evaluated for removal. She states she had previously had Vicodin for this pain in the past and that had good success in relieving the pain. Otherwise she has tried over-the-counter Tylenol, ibuprofen, hot packs, and ice packs with little to no relief. She also reports that she recently lost her mother in January and her father in May and that has been difficult but she is still able to "live one day to time" and has positive outlook and optimism. She is not  suicidal at this time. She states that she is number age of 43 children and that the family is still pre-well-connected after the recent loss. Otherwise she has no other complaints today.    Past Medical History  Diagnosis Date  . Diabetes mellitus   . Hyperlipidemia   . Hypertension   . GERD (gastroesophageal reflux disease)   . Tobacco abuse   . Iron deficiency anemia   . Menopause   . Ganglion cyst     right wrist  . 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 amitriptyline (ELAVIL) 25 MG tablet Take 25 mg by mouth at bedtime.        Marland Kitchen amLODipine (NORVASC) 5 MG tablet TAKE 2 TABLETS BY MOUTH EVERY DAY  60 tablet  2  . aspirin 81 MG EC tablet Take 81 mg by mouth daily.        . B-D ULTRAFINE III SHORT PEN 31G X 8 MM MISC USE 3 TIMES DAILY WITH NOVOLOG FLEXPEN  100 each  10  . Blood Glucose Monitoring Suppl (ACCU-CHEK AVIVA) kit Use as instructed       . glucose blood (ACCU-CHEK AVIVA PLUS) test strip Use to check blood sugar 4 times a day  150 each  12  . hydrochlorothiazide (HYDRODIURIL) 25 MG tablet TAKE 1 TABLET BY MOUTH EVERY DAY  62 tablet  4  . HYDROcodone-acetaminophen (VICODIN) 5-500 MG per tablet Take 1 tablet by mouth every  6 (six) hours as needed for pain.  20 tablet  0  . insulin aspart (NOVOLOG FLEXPEN) 100 UNIT/ML injection Inject into the skin. 5 units with breakfast, 10 units with lunch, and 8 units with dinner       . insulin glargine (LANTUS) 100 UNIT/ML injection Take 30 units subcutaneously in the morning and 20 units in the evening  10 mL  12  . Insulin Syringe-Needle U-100 (BD INSULIN SYRINGE ULTRAFINE) 31G X 5/16" 0.5 ML MISC Use as instructed to inject insulin sq.  100 each  11  . Lancets MISC Use to test blood sugar 4 times a day  150 each  12  . lisinopril (PRINIVIL,ZESTRIL) 40 MG tablet TAKE 1 TABLET BY MOUTH ONCE A DAY  31 tablet  2  . metoprolol tartrate (LOPRESSOR) 25 MG  tablet TAKE 1 TABLET BY MOUTH TWICE A DAY  60 tablet  1  . metoprolol tartrate (LOPRESSOR) 25 MG tablet TAKE 1 TABLET BY MOUTH TWICE A DAY  60 tablet  2  . omeprazole (PRILOSEC) 20 MG capsule Take 1 capsule (20 mg total) by mouth 2 (two) times daily.  60 capsule  1  . simvastatin (ZOCOR) 20 MG tablet TAKE 1 TABLET (20 MG TOTAL) BY MOUTH DAILY.  30 tablet  5  . traMADol (ULTRAM) 50 MG tablet Take 1 tablet (50 mg total) by mouth every 6 (six) hours as needed for pain.  30 tablet  0   Family History  Problem Relation Age of Onset  . Stroke Father    History   Social History  . Marital Status: Single    Spouse Name: N/A    Number of Children: N/A  . Years of Education: N/A   Social History Main Topics  . Smoking status: Former Smoker -- 0.0 packs/day    Quit date: 02/09/2009  . Smokeless tobacco: None  . Alcohol Use: No  . Drug Use: No  . Sexually Active: Yes   Other Topics Concern  . None   Social History Narrative  . None   Review of Systems: Pertinent listed in HPI  Objective:  Physical Exam: Filed Vitals:   05/20/12 0902  BP: 127/82  Pulse: 91  Temp: 97.1 F (36.2 C)  TempSrc: Oral  Height: 5' (1.524 m)  Weight: 101 lb 6.4 oz (45.995 kg)  SpO2: 100%   General: NAD, cachetic HEENT: no palpable nodules/masses in thyroid, normal movement with swallowing, voice hoarseness Cardiac: RRR, no rubs, murmurs or gallops Pulm: clear to auscultation bilaterally, moving normal volumes of air Abd: soft, nontender, nondistended, BS present Ext: warm and well perfused, no pedal edema Neuro: alert and oriented X3, cranial nerves II-XII grossly intact  Assessment & Plan:  1.Left shoulder pain: Patient continues to report having ongoing left shoulder pain that she believes is secondary to a lipoma that she is having evaluated for removal. She has tried hot packs, ice packs, Tylenol and ibuprofen with little relief. She had a recent visit on 04/10/12 and was given tramadol that also  did not relieve the pain. She has had Vicodin in the past for pain and feels that that works best. Patient has a physical therapy referral already but has not made that visit yet. On physical exam she has only limited extension of her left shoulder and negative Apley and negative scratch test and negative can test. -Patient was given 20 tablets of Vicodin and informed that this is only acute management and that if her shoulder pain continues  he would need further evaluation in that she's encouraged to keep her physical therapy appointment  2. Graves' disease: Patient was informed of her TSI lab that was elevated likely confirming her diagnosis of Graves' disease. She was previously referred to endocrinology to see Dr. Buddy Duty and says that she had made that appointment were she was given some medication, she believes is methimazole. She has a followup appointment with him on 06/03/12 after some lab work was collected and to discuss treatment options. We do not have those records available in the clinic. -Will work to collect endocrinology visit reports   3. Diabetes: Patient hemoglobin A1c is 7.9 today which is decreased from her last which was 8.3 on 3/13. Patient continues to be compliant with her insulin and NovoLog and recently had a 20 pound weight loss secondary to new diagnosis of Graves' disease. -No new changes were made today -Patient was encouraged to followup with her PCP for regular diabetes management  Patient was seen and evaluated with Dr. Stann Mainland.

## 2012-05-21 ENCOUNTER — Other Ambulatory Visit: Payer: Self-pay | Admitting: Internal Medicine

## 2012-06-07 ENCOUNTER — Other Ambulatory Visit: Payer: Self-pay | Admitting: Internal Medicine

## 2012-06-08 NOTE — Telephone Encounter (Signed)
Needs Nov appt with PCP. Routine care.

## 2012-06-23 IMAGING — PT NM PET TUM IMG RESTAG (PS) SKULL BASE T - THIGH
6 series · 25 of 25 positions shown · non-contrast
Comparison: 11/06/2009

CLINICAL DATA: Subsequent treatment strategy for oropharyngeal
squamous cell carcinoma.

NUCLEAR MEDICINE PET CT RESTAGING (PS) SKULL BASE TO THIGH
TECHNIQUE: 15.8 mCi F-18 FDG was injected intravenously via the
left arm.  Full-ring PET imaging was performed from the skull base
through the mid-thighs 55  minutes after injection.  CT data was
obtained and used for attenuation correction and anatomic
localization only.  (This was not acquired as a diagnostic CT
examination.)
Fasting Blood Glucose:  257

[Series 1: pet ac · axial · 3.3mm · 4.69mm/px · z∈[-746,-20]mm · 5 of 223 slices shown]
[im 1/223]
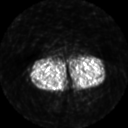
[im 56/223]
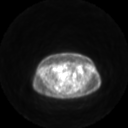
[im 112/223]
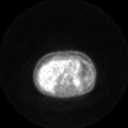
[im 167/223]
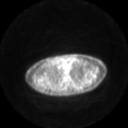
[im 223/223]
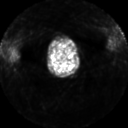

[Series 2: pet nac · axial · 3.3mm · 4.69mm/px · z∈[-746,-20]mm · 6 of 223 slices shown]
[im 1/223]
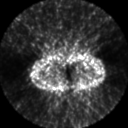
[im 45/223]
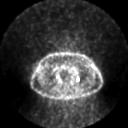
[im 89/223]
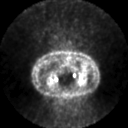
[im 134/223]
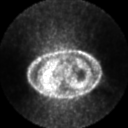
[im 178/223]
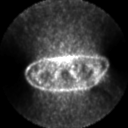
[im 223/223]
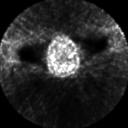

[Series 2: ct images · axial · 3.8mm · 0.98mm/px · z∈[-746,-20]mm · 5 of 223 slices shown]
[im 1/223]
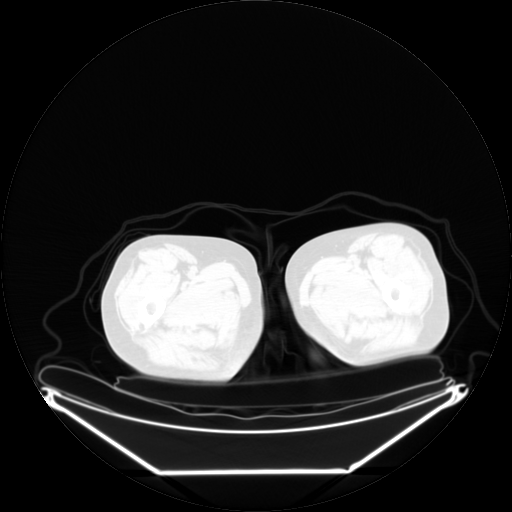
[im 56/223]
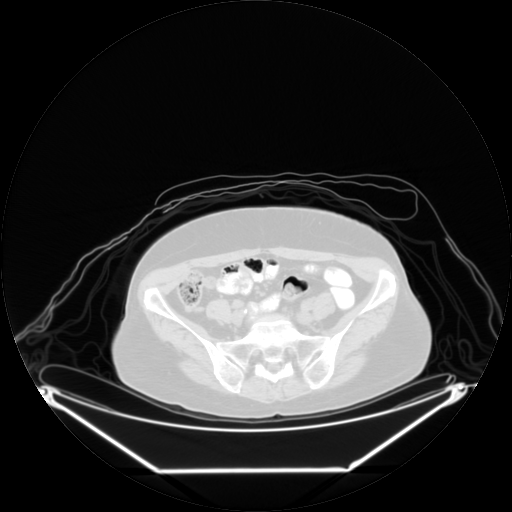
[im 112/223]
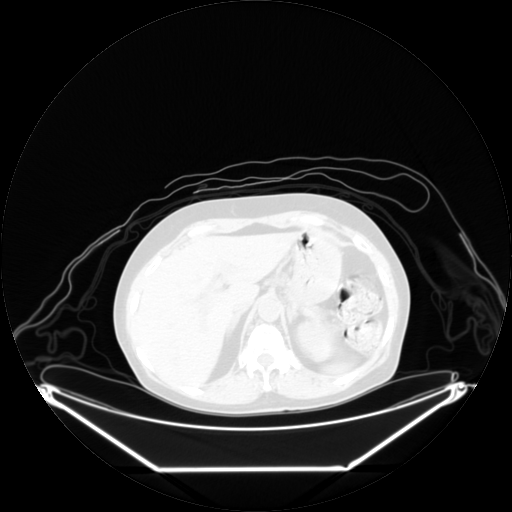
[im 167/223]
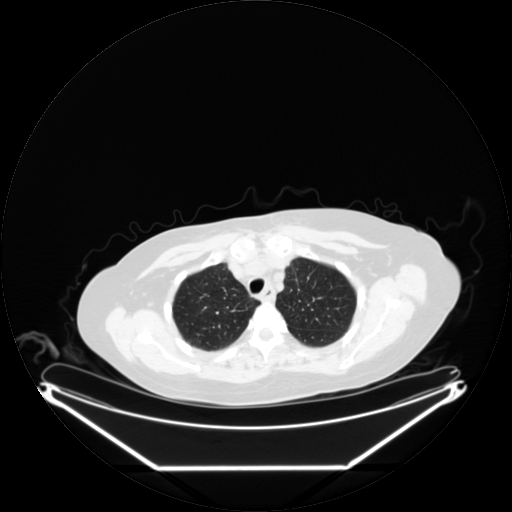
[im 223/223]
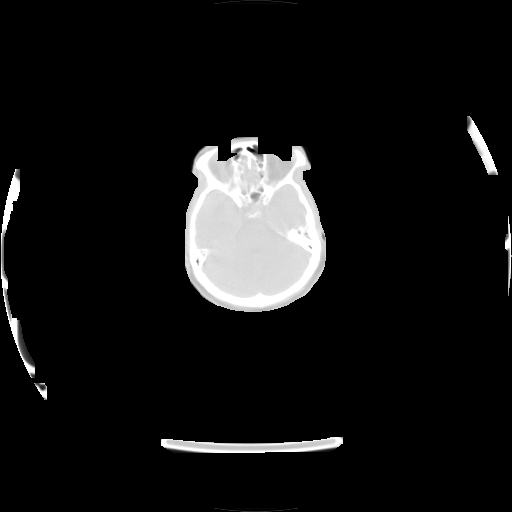

[Series 123: mip · coronal · 3.3mm · 4.69mm/px · 1 of 30 slices shown]
[im 1/30]
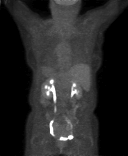

[Series 151: reformatted · axial · 3.3mm · 3.91mm/px · z∈[-746,-20]mm · 6 of 221 slices shown (1 of 2)]
[im 1/221]
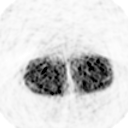
[im 45/221]
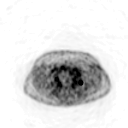
[im 89/221]
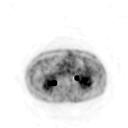
[im 133/221]
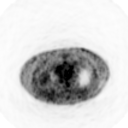
[im 177/221]
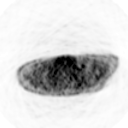
[im 221/221]
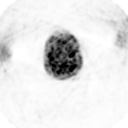

[Series 153: reformatted · coronal · 4.7mm · 5.83mm/px · 2 of 61 slices shown (2 of 2)]
[im 1/61]
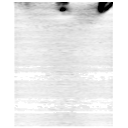
[im 61/61]
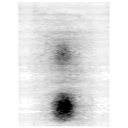

[25 of 25 positions shown; findings below may reference images not displayed]

FINDINGS: Post-treatment changes are now seen in the left neck.
Previously seen level II hypermetabolic lymphadenopathy is no
longer visualized.  No other sites of hypermetabolic
lymphadenopathy are identified within the neck.

No hypermetabolic soft tissue masses or lymphadenopathy are
identified within the chest, abdomen, or pelvis.

Corresponding CT images again show the presence of multiple
calcified uterine fibroids and mild bilateral ureterectasis.
IMPRESSION: Resolution of hypermetabolic left level II cervical
lymphadenopathy.  No evidence of residual local or distant
metastatic carcinoma.

## 2012-07-07 ENCOUNTER — Other Ambulatory Visit: Payer: Self-pay | Admitting: Internal Medicine

## 2012-07-07 NOTE — Telephone Encounter (Signed)
Needs appt end of Nov / early Dec PCP. Routine F/U

## 2012-07-18 ENCOUNTER — Other Ambulatory Visit: Payer: Self-pay | Admitting: Internal Medicine

## 2012-07-20 ENCOUNTER — Telehealth: Payer: Self-pay | Admitting: *Deleted

## 2012-07-20 NOTE — Telephone Encounter (Signed)
Past 3 days yellow drainage left breast. Sore. Pt thinks she has another cyst. Appt 07/11/12 3:30PM Dr Burnard Bunting. Hilda Blades Janey Petron RN 07/20/12 4:10PM

## 2012-07-21 ENCOUNTER — Encounter: Payer: Self-pay | Admitting: Internal Medicine

## 2012-07-21 ENCOUNTER — Ambulatory Visit: Payer: Medicaid Other | Admitting: Internal Medicine

## 2012-07-21 ENCOUNTER — Other Ambulatory Visit: Payer: Self-pay | Admitting: Internal Medicine

## 2012-07-21 ENCOUNTER — Ambulatory Visit (INDEPENDENT_AMBULATORY_CARE_PROVIDER_SITE_OTHER): Payer: Medicaid Other | Admitting: Internal Medicine

## 2012-07-21 VITALS — BP 131/80 | HR 91 | Temp 97.8°F | Ht 65.5 in | Wt 115.9 lb

## 2012-07-21 DIAGNOSIS — M25519 Pain in unspecified shoulder: Secondary | ICD-10-CM

## 2012-07-21 DIAGNOSIS — M25512 Pain in left shoulder: Secondary | ICD-10-CM

## 2012-07-21 DIAGNOSIS — N6459 Other signs and symptoms in breast: Secondary | ICD-10-CM

## 2012-07-21 DIAGNOSIS — N6452 Nipple discharge: Secondary | ICD-10-CM | POA: Insufficient documentation

## 2012-07-21 DIAGNOSIS — E0590 Thyrotoxicosis, unspecified without thyrotoxic crisis or storm: Secondary | ICD-10-CM

## 2012-07-21 LAB — PROLACTIN: Prolactin: 12.4 ng/mL

## 2012-07-21 MED ORDER — MELOXICAM 7.5 MG PO TABS: 7.5000 mg | ORAL_TABLET | Freq: Every day | ORAL | Status: DC | PRN

## 2012-07-21 NOTE — Assessment & Plan Note (Signed)
No imaging, but suspicious of OA.  Patient requesting Vicodin, but she reports being out of this medication for 1-2 months.  Tramadol has not worked for her in the past.  For now, will do trial of mobic 7.5mg  daily as needed and she will follow up with PCP in November.

## 2012-07-21 NOTE — Patient Instructions (Addendum)
-  I am checking some lab work to see if there is another reason for your nipple discharge  -Please be sure to have your breast imaging done - both mammogram and ultrasound  -I sent in a medication called mobic - you can take 7.5 mg daily as needed to help with your shoulder pain.  Please be sure to bring all of your medications with you to every visit.  Should you have any new or worsening symptoms, please be sure to call the clinic at [phone].

## 2012-07-21 NOTE — Assessment & Plan Note (Signed)
Has occurred in the past, and found to be a cyst, that was drained.  Will repeat diagnostic mammo (left breast only) and left breast US.  No significant findings on exam.  Also check prolactin level.

## 2012-07-21 NOTE — Telephone Encounter (Signed)
Has appt end of Nov with PCP

## 2012-07-21 NOTE — Assessment & Plan Note (Signed)
Followed by Dr. Buddy Duty. On Methimazole treatment.

## 2012-07-21 NOTE — Progress Notes (Signed)
Subjective:   Patient ID: Susan Evans female   DOB: 1956-02-10 56 y.o.   MRN: [id number]  HPI: [name] is a 56 y.o. woman with h/o oropharyngeal carcinoma, DM, HLD AND HTN who presents for:  Left breast leaking yellow fluid for the last 3-4days.  No trauma.  Similar event in the past, found to be a cyst.  Not painful, but does sting/is bothersome.  No weight loss or vision changes.  No fever.     Past Medical History  Diagnosis Date  . Diabetes mellitus   . Hyperlipidemia   . Hypertension   . GERD (gastroesophageal reflux disease)   . Tobacco abuse   . Iron deficiency anemia   . Menopause   . Ganglion cyst     right wrist  . 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 amitriptyline (ELAVIL) 25 MG tablet Take 25 mg by mouth at bedtime.        Marland Kitchen amLODipine (NORVASC) 5 MG tablet TAKE 2 TABLETS BY MOUTH EVERY DAY  60 tablet  2  . aspirin 81 MG EC tablet Take 81 mg by mouth daily.        . B-D INS SYRINGE 0.5CC/31GX5/16 31G X 5/16" 0.5 ML MISC USE AS INSTRUCTED TO INJECT INSULIN SUBCUTANEOUSLY  100 each  4  . B-D ULTRAFINE III SHORT PEN 31G X 8 MM MISC USE 3 TIMES DAILY WITH NOVOLOG FLEXPEN  100 each  10  . Blood Glucose Monitoring Suppl (ACCU-CHEK AVIVA) kit Use as instructed       . glucose blood (ACCU-CHEK AVIVA PLUS) test strip Use to check blood sugar 4 times a day  150 each  12  . hydrochlorothiazide (HYDRODIURIL) 25 MG tablet TAKE 1 TABLET BY MOUTH EVERY DAY  62 tablet  4  . HYDROcodone-acetaminophen (VICODIN) 5-500 MG per tablet Take 1 tablet by mouth every 6 (six) hours as needed for pain.  20 tablet  0  . insulin aspart (NOVOLOG FLEXPEN) 100 UNIT/ML injection Inject into the skin. 5 units with breakfast, 10 units with lunch, and 8 units with dinner       . insulin glargine (LANTUS) 100 UNIT/ML injection Take 30 units subcutaneously in the morning and 20 units in the evening  10  mL  12  . Lancets MISC Use to test blood sugar 4 times a day  150 each  12  . lisinopril (PRINIVIL,ZESTRIL) 40 MG tablet TAKE 1 TABLET BY MOUTH ONCE A DAY  31 tablet  2  . metoprolol tartrate (LOPRESSOR) 25 MG tablet TAKE 1 TABLET BY MOUTH TWICE A DAY  60 tablet  5  . omeprazole (PRILOSEC) 20 MG capsule TAKE 1 CAPSULE (20 MG TOTAL) BY MOUTH 2 (TWO) TIMES DAILY.  60 capsule  0  . simvastatin (ZOCOR) 20 MG tablet TAKE 1 TABLET (20 MG TOTAL) BY MOUTH DAILY.  30 tablet  5  . DISCONTD: metoprolol tartrate (LOPRESSOR) 25 MG tablet TAKE 1 TABLET BY MOUTH TWICE A DAY  60 tablet  2   Family History  Problem Relation Age of Onset  . Stroke Father    History   Social History  . Marital Status: Single    Spouse Name: N/A    Number of Children: N/A  . Years of Education: N/A   Social History Main Topics  . Smoking status: Former Smoker -- 0.0 packs/day    Quit date: 02/09/2009  .  Smokeless tobacco: None  . Alcohol Use: No  . Drug Use: No  . Sexually Active: Yes   Other Topics Concern  . None   Social History Narrative  . None   Review of Systems: General: no fevers, chills, changes in weight, changes in appetite Skin: no rash HEENT: no blurry vision, hearing changes, sore throat Pulm: no dyspnea, coughing, wheezing CV: no chest pain, palpitations, shortness of breath Abd: no abdominal pain, nausea/vomiting, diarrhea/constipation GU: no dysuria, hematuria, polyuria Ext: no arthralgias, myalgias Neuro: no weakness, numbness, or tingling   Objective:  Physical Exam: Filed Vitals:   07/21/12 1602  BP: 131/80  Pulse: 91  Temp: 97.8 F (36.6 C)  TempSrc: Oral  Height: 5' 5.5" (1.664 m)  Weight: 115 lb 14.4 oz (52.572 kg)  SpO2: 99%   Constitutional: Vital signs reviewed.  Patient is a well-developed and well-nourished woman in no acute distress and cooperative with exam.  Breast: No palpable mass of b/l breasts, unable to express discharge from left nipple. Irregular left  nipple, but has h/o breast reduction surgery Mouth: no erythema or exudates, MMM Eyes: PERRL, EOMI, conjunctivae normal, No scleral icterus.  Cardiovascular: RRR, S1 normal, S2 normal, no MRG, pulses symmetric and intact bilaterally Pulmonary/Chest: CTAB, no wheezes, rales, or rhonchi Abdominal: Soft. Non-tender, non-distended, bowel sounds are normal, no masses, organomegaly, or guarding present.  Musculoskeletal: right shoulder with limited abduction (to about 80-90 degrees), as well as internal and external rotation Neurological: A&O x3 Skin: Warm, dry and intact. No rash, cyanosis, or clubbing.    Assessment & Plan:  Case and care discussed with Dr. Lynnae January. PLease see problem oriented charting for further details.

## 2012-07-22 ENCOUNTER — Other Ambulatory Visit: Payer: Self-pay | Admitting: Internal Medicine

## 2012-07-22 NOTE — Progress Notes (Signed)
Pt aware [facility] will call pt 07/22/12 to sch appt. Hilda Blades Judith Demps RN 07/22/12 8:45AM

## 2012-07-23 NOTE — Addendum Note (Signed)
Addended by: Othella Boyer on: 07/23/2012 09:59 AM   Modules accepted: Orders

## 2012-07-28 ENCOUNTER — Encounter: Payer: Medicaid Other | Admitting: Internal Medicine

## 2012-07-29 ENCOUNTER — Ambulatory Visit
Admission: RE | Admit: 2012-07-29 | Discharge: 2012-07-29 | Disposition: A | Discharge status: Home / Self Care | Payer: Medicaid Other | Source: Ambulatory Visit | Attending: Internal Medicine | Admitting: Internal Medicine

## 2012-07-29 DIAGNOSIS — N6452 Nipple discharge: Secondary | ICD-10-CM

## 2012-08-11 ENCOUNTER — Ambulatory Visit: Payer: Medicaid Other | Admitting: Internal Medicine

## 2012-08-13 ENCOUNTER — Ambulatory Visit (INDEPENDENT_AMBULATORY_CARE_PROVIDER_SITE_OTHER): Payer: Medicaid Other | Admitting: Internal Medicine

## 2012-08-13 ENCOUNTER — Encounter: Payer: Self-pay | Admitting: Internal Medicine

## 2012-08-13 VITALS — BP 126/75 | HR 76 | Temp 98.7°F | Wt 122.0 lb

## 2012-08-13 DIAGNOSIS — Z79899 Other long term (current) drug therapy: Secondary | ICD-10-CM

## 2012-08-13 DIAGNOSIS — E119 Type 2 diabetes mellitus without complications: Secondary | ICD-10-CM

## 2012-08-13 DIAGNOSIS — J069 Acute upper respiratory infection, unspecified: Secondary | ICD-10-CM

## 2012-08-13 LAB — CBC WITH DIFFERENTIAL/PLATELET
Basophils Absolute: 0 10*3/uL (ref 0.0–0.1)
Basophils Relative: 0 % (ref 0–1)
HCT: 38.9 % (ref 36.0–46.0)
Hemoglobin: 12.8 g/dL (ref 12.0–15.0)
Lymphs Abs: 2.7 10*3/uL (ref 0.7–4.0)
MCHC: 32.9 g/dL (ref 30.0–36.0)
Monocytes Absolute: 1 10*3/uL (ref 0.1–1.0)
Neutro Abs: 5.5 10*3/uL (ref 1.7–7.7)
Neutrophils Relative %: 62 % (ref 43–77)
Platelets: 157 10*3/uL (ref 150–400)
RDW: 15.9 % — ABNORMAL HIGH (ref 11.5–15.5)

## 2012-08-13 LAB — GLUCOSE, CAPILLARY: Glucose-Capillary: 224 mg/dL — ABNORMAL HIGH (ref 70–99)

## 2012-08-13 LAB — POCT GLYCOSYLATED HEMOGLOBIN (HGB A1C): Hemoglobin A1C: 7.4

## 2012-08-13 MED ORDER — BENZONATATE 100 MG PO CAPS: 100.0000 mg | ORAL_CAPSULE | Freq: Three times a day (TID) | ORAL | Status: DC | PRN

## 2012-08-13 MED ORDER — GUAIFENESIN ER 600 MG PO TB12: 600.0000 mg | ORAL_TABLET | Freq: Two times a day (BID) | ORAL | Status: DC

## 2012-08-13 NOTE — Progress Notes (Signed)
Internal Medicine Clinic Visit    HPI:  Susan Evans is a 56 y.o. year old female who presents to clinic with symptoms of cough and congestion for 1 week. She states that she has had a cough, occasional white sputum production, sneezing, watery eyes, and nasal congestion for the past week. Denies shortness of breath, sick contacts, myalgias. She has had the flu shot already this year. She has tried cough medicine as well as daily claritin which has helped a little.  She has been taking her insulin as prescribed and states her AM BS ranges from 70-90 with the highest in the last month being 140s. No hypoglycemic symptoms.  ROS + for subjective fever and chills, chronic constipation (last BM this AM)     Past Medical History  Diagnosis Date  . Diabetes mellitus   . Hyperlipidemia   . Hypertension   . GERD (gastroesophageal reflux disease)   . Tobacco abuse   . Iron deficiency anemia   . Menopause   . Ganglion cyst     right wrist  . Malignant neoplasm 04/11    oropharyngeal carcinoma    No past surgical history on file.   ROS:  A complete review of systems was otherwise negative, except as noted in the HPI.  Allergies: Penicillins  Medications: Current Outpatient Prescriptions  Medication Sig Dispense Refill  . amitriptyline (ELAVIL) 25 MG tablet Take 25 mg by mouth at bedtime.        Marland Kitchen amLODipine (NORVASC) 5 MG tablet TAKE 2 TABLETS BY MOUTH EVERY DAY  60 tablet  2  . aspirin 81 MG EC tablet Take 81 mg by mouth daily.        . B-D INS SYRINGE 0.5CC/31GX5/16 31G X 5/16" 0.5 ML MISC USE AS INSTRUCTED TO INJECT INSULIN SUBCUTANEOUSLY  100 each  4  . B-D ULTRAFINE III SHORT PEN 31G X 8 MM MISC USE 3 TIMES DAILY WITH NOVOLOG FLEXPEN  100 each  10  . benzonatate (TESSALON PERLES) 100 MG capsule Take 1 capsule (100 mg total) by mouth 3 (three) times daily as needed for cough.  30 capsule  0  . Blood Glucose Monitoring Suppl (ACCU-CHEK AVIVA) kit Use as instructed       .  glucose blood (ACCU-CHEK AVIVA PLUS) test strip Use to check blood sugar 4 times a day  150 each  12  . guaiFENesin (MUCINEX) 600 MG 12 hr tablet Take 1 tablet (600 mg total) by mouth 2 (two) times daily.  60 tablet  0  . hydrochlorothiazide (HYDRODIURIL) 25 MG tablet TAKE 1 TABLET BY MOUTH EVERY DAY  62 tablet  4  . HYDROcodone-acetaminophen (VICODIN) 5-500 MG per tablet Take 1 tablet by mouth every 6 (six) hours as needed for pain.  20 tablet  0  . insulin aspart (NOVOLOG FLEXPEN) 100 UNIT/ML injection Inject into the skin. 5 units with breakfast, 10 units with lunch, and 8 units with dinner       . insulin glargine (LANTUS) 100 UNIT/ML injection Take 30 units subcutaneously in the morning and 20 units in the evening  10 mL  12  . Lancets MISC Use to test blood sugar 4 times a day  150 each  12  . lisinopril (PRINIVIL,ZESTRIL) 40 MG tablet TAKE 1 TABLET BY MOUTH ONCE A DAY  31 tablet  2  . meloxicam (MOBIC) 7.5 MG tablet Take 1 tablet (7.5 mg total) by mouth daily as needed for pain.  30 tablet  0  .  metoprolol tartrate (LOPRESSOR) 25 MG tablet TAKE 1 TABLET BY MOUTH TWICE A DAY  60 tablet  5  . omeprazole (PRILOSEC) 20 MG capsule TAKE 1 CAPSULE (20 MG TOTAL) BY MOUTH 2 (TWO) TIMES DAILY.  60 capsule  1  . simvastatin (ZOCOR) 20 MG tablet TAKE 1 TABLET (20 MG TOTAL) BY MOUTH DAILY.  30 tablet  5    History   Social History  . Marital Status: Single    Spouse Name: N/A    Number of Children: N/A  . Years of Education: N/A   Occupational History  . Not on file.   Social History Main Topics  . Smoking status: Former Smoker -- 0.0 packs/day    Quit date: 02/09/2009  . Smokeless tobacco: Not on file  . Alcohol Use: No  . Drug Use: No  . Sexually Active: Yes   Other Topics Concern  . Not on file   Social History Narrative  . No narrative on file    family history includes Stroke in her father.  Physical Exam Blood pressure 126/75, pulse 76, temperature 98.7 F (37.1 C),  temperature source Oral, weight 122 lb (55.339 kg), SpO2 100.00%. General:  No acute distress, alert and oriented x 3, well-appearing  HEENT:  PERRL, EOMI, TMs intact, not distended, nasopharynx is erythematous, clear drainage noted, oropharynx is slightly erythematous, no exudate Cardiovascular:  Regular rate and rhythm, no murmurs, rubs or gallops Respiratory:  Some bronchial breath sounds, loud upper airway noises, no tachypnea or increased WOB Abdomen:  Soft, nondistended, nontender, normoactive bowel sounds Extremities:  Warm and well-perfused, no clubbing, cyanosis, or edema.  Skin: Warm, dry, no rashes Neuro: Not anxious appearing, no depressed mood, normal affect  Labs: Lab Results  Component Value Date   CREATININE 0.53 04/06/2012   BUN 9 04/06/2012   NA 134* 04/06/2012   K 4.0 04/06/2012   CL 96 04/06/2012   CO2 30 04/06/2012   Lab Results  Component Value Date   WBC 8.2 04/06/2012   HGB 11.0* 04/06/2012   HCT 34.1* 04/06/2012   MCV 70.7* 04/06/2012   PLT 181 04/06/2012      Assessment and Plan:    FOLLOWUP: Susan Evans will follow back up in our clinic in approximately  2-3 months, sooner if needed. Susan Evans knows to call out clinic in the meantime with any questions or if her symptoms worsen or fail to improve in the next week.  Patient was seen and evaluated by Santa Lighter, MD,  and Dr Margot Ables, Attending Physician.

## 2012-08-13 NOTE — Assessment & Plan Note (Signed)
Patient is afebrile, some bronchial breath sounds. Suspect viral URI but will rule out PNA. Patient has had flu vaccine and lab will not test influenza as outpatient. -tesslon perles -mucinex -check CXR, CBD with diff -continue claritin  Patient knows to call our clinic if symptoms do not improve within several days.

## 2012-08-13 NOTE — Assessment & Plan Note (Signed)
A1c is 7.4 today, not at goal. Patient states she is taking her insulin as described but not always following a diabetic diet. As patient has acute illness this visit, we will discuss further on the next visit. Foot exam done: she has some evidence of callouses as well as peeling skin. Recommend athletes foot cream. No ulcerations. -cont current regimen -encouraged diabetic diet -return to clinic in 2-3 months

## 2012-08-13 NOTE — Patient Instructions (Signed)
Prescriptions given for Mucinex (also available over the counter) and Tessalon perles for cough. Please call the clinic if your symptoms are not improve within 4 days or if you experience worsening shortness of breath or chest pain.  Follow up in clinic for regular visit in 1-2 months

## 2012-08-20 ENCOUNTER — Other Ambulatory Visit: Payer: Self-pay | Admitting: Internal Medicine

## 2012-08-21 ENCOUNTER — Inpatient Hospital Stay (HOSPITAL_COMMUNITY)
Admission: EM | Admit: 2012-08-21 | Discharge: 2012-08-23 | DRG: 641 | Disposition: A | Discharge status: Home / Self Care | Payer: Medicaid Other | Attending: Internal Medicine | Admitting: Internal Medicine

## 2012-08-21 ENCOUNTER — Ambulatory Visit (HOSPITAL_COMMUNITY)
Admission: RE | Admit: 2012-08-21 | Discharge: 2012-08-21 | Disposition: A | Discharge status: Home / Self Care | Payer: Medicaid Other | Source: Ambulatory Visit | Attending: Internal Medicine | Admitting: Internal Medicine

## 2012-08-21 ENCOUNTER — Encounter (HOSPITAL_COMMUNITY): Payer: Self-pay | Admitting: *Deleted

## 2012-08-21 ENCOUNTER — Other Ambulatory Visit: Payer: Self-pay

## 2012-08-21 ENCOUNTER — Telehealth: Payer: Self-pay | Admitting: Internal Medicine

## 2012-08-21 DIAGNOSIS — I10 Essential (primary) hypertension: Secondary | ICD-10-CM | POA: Diagnosis present

## 2012-08-21 DIAGNOSIS — Z923 Personal history of irradiation: Secondary | ICD-10-CM

## 2012-08-21 DIAGNOSIS — J90 Pleural effusion, not elsewhere classified: Secondary | ICD-10-CM | POA: Diagnosis present

## 2012-08-21 DIAGNOSIS — Z794 Long term (current) use of insulin: Secondary | ICD-10-CM

## 2012-08-21 DIAGNOSIS — J069 Acute upper respiratory infection, unspecified: Secondary | ICD-10-CM

## 2012-08-21 DIAGNOSIS — Z85819 Personal history of malignant neoplasm of unspecified site of lip, oral cavity, and pharynx: Secondary | ICD-10-CM

## 2012-08-21 DIAGNOSIS — Z7982 Long term (current) use of aspirin: Secondary | ICD-10-CM

## 2012-08-21 DIAGNOSIS — M25512 Pain in left shoulder: Secondary | ICD-10-CM

## 2012-08-21 DIAGNOSIS — R945 Abnormal results of liver function studies: Secondary | ICD-10-CM | POA: Diagnosis present

## 2012-08-21 DIAGNOSIS — R599 Enlarged lymph nodes, unspecified: Secondary | ICD-10-CM

## 2012-08-21 DIAGNOSIS — K219 Gastro-esophageal reflux disease without esophagitis: Secondary | ICD-10-CM | POA: Diagnosis present

## 2012-08-21 DIAGNOSIS — E118 Type 2 diabetes mellitus with unspecified complications: Secondary | ICD-10-CM | POA: Diagnosis present

## 2012-08-21 DIAGNOSIS — Z880 Allergy status to penicillin: Secondary | ICD-10-CM

## 2012-08-21 DIAGNOSIS — E785 Hyperlipidemia, unspecified: Secondary | ICD-10-CM | POA: Diagnosis present

## 2012-08-21 DIAGNOSIS — E876 Hypokalemia: Secondary | ICD-10-CM

## 2012-08-21 DIAGNOSIS — R05 Cough: Secondary | ICD-10-CM | POA: Diagnosis present

## 2012-08-21 DIAGNOSIS — E119 Type 2 diabetes mellitus without complications: Secondary | ICD-10-CM

## 2012-08-21 DIAGNOSIS — J9819 Other pulmonary collapse: Secondary | ICD-10-CM | POA: Diagnosis present

## 2012-08-21 DIAGNOSIS — E869 Volume depletion, unspecified: Secondary | ICD-10-CM | POA: Diagnosis present

## 2012-08-21 DIAGNOSIS — E871 Hypo-osmolality and hyponatremia: Principal | ICD-10-CM | POA: Diagnosis present

## 2012-08-21 DIAGNOSIS — Z23 Encounter for immunization: Secondary | ICD-10-CM

## 2012-08-21 DIAGNOSIS — Z79899 Other long term (current) drug therapy: Secondary | ICD-10-CM

## 2012-08-21 DIAGNOSIS — R0989 Other specified symptoms and signs involving the circulatory and respiratory systems: Secondary | ICD-10-CM | POA: Insufficient documentation

## 2012-08-21 DIAGNOSIS — Z9221 Personal history of antineoplastic chemotherapy: Secondary | ICD-10-CM

## 2012-08-21 DIAGNOSIS — R7989 Other specified abnormal findings of blood chemistry: Secondary | ICD-10-CM

## 2012-08-21 DIAGNOSIS — R0982 Postnasal drip: Secondary | ICD-10-CM | POA: Diagnosis present

## 2012-08-21 DIAGNOSIS — E039 Hypothyroidism, unspecified: Secondary | ICD-10-CM | POA: Diagnosis present

## 2012-08-21 DIAGNOSIS — Z87891 Personal history of nicotine dependence: Secondary | ICD-10-CM

## 2012-08-21 DIAGNOSIS — R059 Cough, unspecified: Secondary | ICD-10-CM | POA: Insufficient documentation

## 2012-08-21 DIAGNOSIS — E878 Other disorders of electrolyte and fluid balance, not elsewhere classified: Secondary | ICD-10-CM

## 2012-08-21 LAB — COMPREHENSIVE METABOLIC PANEL
ALT: 29 U/L (ref 0–35)
AST: 41 U/L — ABNORMAL HIGH (ref 0–37)
Albumin: 3.8 g/dL (ref 3.5–5.2)
Alkaline Phosphatase: 164 U/L — ABNORMAL HIGH (ref 39–117)
Potassium: 3.3 mEq/L — ABNORMAL LOW (ref 3.5–5.1)
Sodium: 119 mEq/L — CL (ref 135–145)
Total Protein: 7.6 g/dL (ref 6.0–8.3)

## 2012-08-21 LAB — CBC WITH DIFFERENTIAL/PLATELET
Basophils Relative: 0 % (ref 0–1)
Eosinophils Absolute: 0.1 10*3/uL (ref 0.0–0.7)
Lymphs Abs: 2.2 10*3/uL (ref 0.7–4.0)
MCH: 23.7 pg — ABNORMAL LOW (ref 26.0–34.0)
MCHC: 33.6 g/dL (ref 30.0–36.0)
Neutrophils Relative %: 72 % (ref 43–77)
Platelets: 261 10*3/uL (ref 150–400)
RBC: 5.31 MIL/uL — ABNORMAL HIGH (ref 3.87–5.11)

## 2012-08-21 LAB — BASIC METABOLIC PANEL
Calcium: 9.4 mg/dL (ref 8.4–10.5)
GFR calc non Af Amer: >90 mL/min (ref >90)
Glucose, Bld: 218 mg/dL — ABNORMAL HIGH (ref 70–99)
Sodium: 123 mEq/L — ABNORMAL LOW (ref 135–145)

## 2012-08-21 MED ORDER — ENOXAPARIN SODIUM 40 MG/0.4ML ~~LOC~~ SOLN
40.0000 mg | SUBCUTANEOUS | Status: DC
  Administered 2012-08-21 – 2012-08-22 (×2): 40 mg via SUBCUTANEOUS
  Filled 2012-08-21 (×3): qty 0.4

## 2012-08-21 MED ORDER — ASPIRIN 81 MG PO CHEW
81.0000 mg | CHEWABLE_TABLET | Freq: Every day | ORAL | Status: DC
  Administered 2012-08-22 – 2012-08-23 (×2): 81 mg via ORAL
  Filled 2012-08-21 (×2): qty 1

## 2012-08-21 MED ORDER — GUAIFENESIN ER 600 MG PO TB12
600.0000 mg | ORAL_TABLET | Freq: Two times a day (BID) | ORAL | Status: DC
  Administered 2012-08-21 – 2012-08-23 (×4): 600 mg via ORAL
  Filled 2012-08-21 (×5): qty 1

## 2012-08-21 MED ORDER — SIMVASTATIN 20 MG PO TABS
20.0000 mg | ORAL_TABLET | Freq: Every day | ORAL | Status: DC
  Administered 2012-08-22: 20 mg via ORAL
  Filled 2012-08-21 (×2): qty 1

## 2012-08-21 MED ORDER — HYDROCODONE-ACETAMINOPHEN 5-325 MG PO TABS: 1.0000 | ORAL_TABLET | Freq: Four times a day (QID) | ORAL | Status: DC | PRN

## 2012-08-21 MED ORDER — METHYLPREDNISOLONE SODIUM SUCC 1000 MG IJ SOLR: 125.0000 mg | Freq: Once | INTRAMUSCULAR | Status: DC

## 2012-08-21 MED ORDER — MENTHOL 3 MG MT LOZG
1.0000 | LOZENGE | OROMUCOSAL | Status: DC | PRN
  Administered 2012-08-22: 3 mg via ORAL
  Filled 2012-08-21: qty 27

## 2012-08-21 MED ORDER — SODIUM CHLORIDE 0.9 % IV SOLN
INTRAVENOUS | Status: DC
  Administered 2012-08-22 – 2012-08-23 (×5): via INTRAVENOUS

## 2012-08-21 MED ORDER — DEXTROSE 5 % IV SOLN
500.0000 mg | Freq: Once | INTRAVENOUS | Status: AC
  Administered 2012-08-21: 500 mg via INTRAVENOUS
  Filled 2012-08-21: qty 500

## 2012-08-21 MED ORDER — INSULIN ASPART 100 UNIT/ML ~~LOC~~ SOLN
0.0000 [IU] | Freq: Three times a day (TID) | SUBCUTANEOUS | Status: DC
  Administered 2012-08-22 (×3): 2 [IU] via SUBCUTANEOUS

## 2012-08-21 MED ORDER — PANTOPRAZOLE SODIUM 20 MG PO TBEC
20.0000 mg | DELAYED_RELEASE_TABLET | Freq: Two times a day (BID) | ORAL | Status: DC
  Administered 2012-08-21 – 2012-08-23 (×4): 20 mg via ORAL
  Filled 2012-08-21 (×6): qty 1

## 2012-08-21 MED ORDER — POTASSIUM CHLORIDE CRYS ER 20 MEQ PO TBCR
40.0000 meq | EXTENDED_RELEASE_TABLET | Freq: Once | ORAL | Status: AC
  Administered 2012-08-21: 40 meq via ORAL
  Filled 2012-08-21: qty 2

## 2012-08-21 MED ORDER — BENZONATATE 100 MG PO CAPS
100.0000 mg | ORAL_CAPSULE | Freq: Three times a day (TID) | ORAL | Status: DC | PRN
  Administered 2012-08-22: 100 mg via ORAL
  Filled 2012-08-21: qty 1

## 2012-08-21 MED ORDER — SODIUM CHLORIDE 0.9 % IV BOLUS (SEPSIS)
1000.0000 mL | Freq: Once | INTRAVENOUS | Status: AC
  Administered 2012-08-21: 1000 mL via INTRAVENOUS

## 2012-08-21 MED ORDER — AMITRIPTYLINE HCL 25 MG PO TABS
25.0000 mg | ORAL_TABLET | Freq: Every day | ORAL | Status: DC
  Administered 2012-08-21 – 2012-08-22 (×2): 25 mg via ORAL
  Filled 2012-08-21 (×3): qty 1

## 2012-08-21 MED ORDER — PNEUMOCOCCAL VAC POLYVALENT 25 MCG/0.5ML IJ INJ
0.5000 mL | INJECTION | INTRAMUSCULAR | Status: AC
  Administered 2012-08-22: 0.5 mL via INTRAMUSCULAR
  Filled 2012-08-21: qty 0.5

## 2012-08-21 MED ORDER — AMLODIPINE BESYLATE 10 MG PO TABS
10.0000 mg | ORAL_TABLET | Freq: Every day | ORAL | Status: DC
  Administered 2012-08-22 – 2012-08-23 (×2): 10 mg via ORAL
  Filled 2012-08-21 (×2): qty 1

## 2012-08-21 MED ORDER — DEXTROSE 5 % IV SOLN
1.0000 g | Freq: Once | INTRAVENOUS | Status: AC
  Administered 2012-08-21: 1 g via INTRAVENOUS
  Filled 2012-08-21: qty 10

## 2012-08-21 MED ORDER — PREDNISONE 50 MG PO TABS: 60.0000 mg | ORAL_TABLET | Freq: Every day | ORAL | Status: DC

## 2012-08-21 MED ORDER — IPRATROPIUM BROMIDE 0.02 % IN SOLN
0.5000 mg | RESPIRATORY_TRACT | Status: DC | PRN
  Administered 2012-08-21: 0.5 mg via RESPIRATORY_TRACT
  Filled 2012-08-21: qty 2.5

## 2012-08-21 MED ORDER — ASPIRIN 81 MG PO TBEC: 81.0000 mg | DELAYED_RELEASE_TABLET | Freq: Every day | ORAL | Status: DC

## 2012-08-21 MED ORDER — ALBUTEROL SULFATE (5 MG/ML) 0.5% IN NEBU
2.5000 mg | INHALATION_SOLUTION | RESPIRATORY_TRACT | Status: DC | PRN
  Administered 2012-08-21: 2.5 mg via RESPIRATORY_TRACT
  Filled 2012-08-21: qty 0.5

## 2012-08-21 MED ORDER — INSULIN GLARGINE 100 UNIT/ML ~~LOC~~ SOLN
25.0000 [IU] | Freq: Every day | SUBCUTANEOUS | Status: DC
  Administered 2012-08-21 – 2012-08-22 (×2): 25 [IU] via SUBCUTANEOUS

## 2012-08-21 MED ORDER — PANTOPRAZOLE SODIUM 20 MG PO TBEC: 20.0000 mg | DELAYED_RELEASE_TABLET | Freq: Two times a day (BID) | ORAL | Status: DC

## 2012-08-21 MED ORDER — MELOXICAM 7.5 MG PO TABS
7.5000 mg | ORAL_TABLET | Freq: Every day | ORAL | Status: DC | PRN
  Filled 2012-08-21: qty 1

## 2012-08-21 NOTE — H&P (Signed)
Hospital Admission Note Date: 08/21/2012  Patient name: Susan Evans Medical record number: [id number] Date of birth: [birth year] Age: 56 y.o. Gender: female PCP: Santa Lighter, MD  Medical Service: Internal Medicine Teaching Service--[place]  Attending physician: Dr. Margot Ables   1st Contact: Dr. Idelle Jo 2nd Contact: Dr. 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  She claims for the past week she has been coughing more with white phlegm and for the past few days has started having "noisy breathing" as per family.  She was recently seen in [facility] on 08/13/12 for similar complaints and suspected to have viral URI and was schedule to have a CXR done at that time.  CXR done today revealed patchy bibasilar airspace opacities and small left pleural effusion.  Susan Evans does complain of associated runny nose, occasional congestion, and feeling something at the back of her throat but denies any headaches, fever, chills, N/V/D, chest pain, shortness of breath, abdominal pain, or any urinary complaints at this time.    Meds: No current facility-administered medications on file prior to encounter.   Current Outpatient Prescriptions on File Prior to Encounter  Medication Sig Dispense Refill  . amitriptyline (ELAVIL) 25 MG tablet Take 25 mg by mouth at bedtime.        Marland Kitchen amLODipine (NORVASC) 5 MG tablet TAKE 2 TABLETS BY MOUTH EVERY DAY  60 tablet  2  . aspirin 81 MG EC tablet Take 81 mg by mouth daily.        . benzonatate (TESSALON PERLES) 100 MG capsule Take 1 capsule (100 mg total) by mouth 3 (three) times daily as needed for cough.  30  capsule  0  . guaiFENesin (MUCINEX) 600 MG 12 hr tablet Take 1 tablet (600 mg total) by mouth 2 (two) times daily.  60 tablet  0  . hydrochlorothiazide (HYDRODIURIL) 25 MG tablet TAKE 1 TABLET BY MOUTH EVERY DAY  62 tablet  4  . HYDROcodone-acetaminophen (VICODIN) 5-500 MG per tablet Take 1 tablet by mouth every 6 (six) hours as needed for pain.  20 tablet  0  . insulin aspart (NOVOLOG FLEXPEN) 100 UNIT/ML injection Inject into the skin. 5 units with breakfast, 10 units with lunch, and 8 units with dinner       . insulin glargine (LANTUS) 100 UNIT/ML injection Take 30 units subcutaneously in the morning and 20 units in the evening  10 mL  12  . lisinopril (PRINIVIL,ZESTRIL) 40 MG tablet TAKE 1 TABLET BY MOUTH ONCE A DAY  31 tablet  2  . meloxicam (MOBIC) 7.5 MG tablet Take 1 tablet (7.5 mg total) by mouth daily as needed for pain.  30 tablet  0  . metoprolol tartrate (LOPRESSOR) 25 MG tablet TAKE 1 TABLET BY MOUTH TWICE A DAY  60 tablet  5  . omeprazole (PRILOSEC) 20 MG capsule TAKE 1 CAPSULE (20 MG TOTAL) BY MOUTH 2 (TWO) TIMES DAILY.  60 capsule  1  . simvastatin (ZOCOR) 20 MG tablet TAKE 1 TABLET (20 MG TOTAL) BY MOUTH DAILY.  30 tablet  5   Allergies: Allergies as of  08/21/2012 - Review Complete 08/21/2012  Allergen Reaction Noted  . Penicillins     Past Medical History  Diagnosis Date  . Diabetes mellitus   . Hyperlipidemia   . Hypertension   . GERD (gastroesophageal reflux disease)   . Tobacco abuse   . Iron deficiency anemia   . Menopause   . Ganglion cyst     right wrist  . Malignant neoplasm 04/11    oropharyngeal carcinoma   History reviewed. No pertinent past surgical history. Family History  Problem Relation Age of Onset  . Stroke Father    History   Social History  . Marital Status: Single    Spouse Name: N/A    Number of Children: N/A  . Years of Education: N/A   Occupational History  . Not on file.   Social History Main Topics  . Smoking status: Former  Smoker -- 0.0 packs/day    Quit date: 02/09/2009  . Smokeless tobacco: Not on file  . Alcohol Use: No  . Drug Use: No  . Sexually Active: Yes   Other Topics Concern  . Not on file   Social History Narrative  . No narrative on file   Review of Systems: Pertinent items are noted in HPI.  Physical Exam: Blood pressure 140/75, pulse 88, temperature 97.8 F (36.6 C), temperature source Oral, resp. rate 24, height 5' 4.5" (1.638 m), weight 123 lb 0.3 oz (55.8 kg), SpO2 100.00%. Vitals reviewed. General: resting in bed, NAD, takes large breath before speaking HEENT: PERRLA, EOMI, b/l scleral icterus, dry mucous membranes, nostrils red, dry.  Neck: no masses palpated. -lymphadenopathy Cardiac: RRR, no rubs, murmurs or gallops Pulm: audible upper airway sounds, b/l rhonchi, diffuse expiratory wheezing Abd: soft, nontender, nondistended, BS present Ext: warm and well perfused, no pedal edema, dry skin Neuro: alert and oriented X3, cranial nerves II-XII grossly intact, strength and sensation to light touch equal in bilateral upper and lower extremities  Lab results: Basic Metabolic Panel:  Basename 08/21/12 1704  NA 119*  K 3.3*  CL 80*  CO2 27  GLUCOSE 170*  BUN 8  CREATININE 0.66  CALCIUM 9.8  MG --  PHOS --   Liver Function Tests:  Basename 08/21/12 1704  AST 41*  ALT 29  ALKPHOS 164*  BILITOT 0.2*  PROT 7.6  ALBUMIN 3.8   CBC:  Basename 08/21/12 1704  WBC 9.9  NEUTROABS 7.2  HGB 12.6  HCT 37.5  MCV 70.6*  PLT 261   Cardiac Enzymes:  Basename 08/21/12 1705  CKTOTAL --  CKMB --  CKMBINDEX --  TROPONINI <0.30   Imaging results:  Dg Chest 2 View  08/21/2012  *RADIOLOGY REPORT*  Clinical Data: Cough, congestion.  CHEST - 2 VIEW  Comparison: Chest CT 10/26/2010  Findings: There is blunting of the left costophrenic angle suggesting small left pleural effusion.  Patchy opacities noted at the lung bases bilaterally, atelectasis versus developing pneumonia.   Heart is normal size.  No acute bony abnormality.  IMPRESSION: Patchy bibasilar airspace opacities, atelectasis versus pneumonia.  Small left pleural effusion.   Original Report Authenticated By: Rolm Baptise, M.D.    Other results: EKG: 88BPM, NSR  Assessment & Plan by Problem:Ms. Shariff is a 56 year old African American female with PMH of DM II, HL, HTN, GERD, anemia, and Oropharyngeal carcinoma admitted for worsening productive cough and hyponatremia   Cough--productive cough with white sputum worsening x1 week.  Possibly secondary to viral URI vs. PNA vs. GERD.  CXR on admission shows patchy bibasilar airspace opacities, atelectasis vs. pna and small left pleural effusion, however afebrile and no leukocytosis with no definite infiltrates or consolidation seen on cxr.  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 Given 1L NS bolus, Rocephin 1g IV and Zithromax 500mg  IV x1 in ED.   -admit to med-surg -blood cx x2--will be drawn after abx started in ED -U/A---250 glucose, 30 protein, negative nitrite, leukocytes, and Hb with 0-2 wbc.  -HIV -mucinex -tessalon perles -cepacol lozenge -duonebz Q4H PRN -o2 therapy prn, keep o2 sat >92% -IVF -Protonix -consider repeat CT soft tissue neck--last one 02/2011 showing stable post-therapy appearance of neck.     Hyponatremia--Na 119 on admission.  Unknown etiology, likely secondary to hypovolemia but consideration can be given for possible euvolemic causes as well including hypothyroidism.  Given 1L NS bolus in ED.  -TSH, fT4 -am cortisol -U/A-250 glucose, 30 protein, negative nitrite, leukocytes, and Hb with 0-2 wbc.  -FeNA--0.1% suggestive of pre-renal. urine Na (56), urine Cr (21.16) -IVF -f/u BMETs   DIABETES MELLITUS, TYPE II--last HbA1C: 7.4 08/13/12.  Glucose 170 on admission.  On home regimen: Lantus 30 units  in AM and 20 units in PM, Novolog 5 units in AM, 10 units with  lunch, and 8 units PM -CBG monitoring -SSI sensitive -Lantus 25 units QHS   HYPERTENSION--BP on admission 140/75.  On home regimen: HCTZ 25mg , Lisinopril 40mg , Norvasc 10mg , and Lopressor 25mg  BID. -hold HCTZ, ACEI, and BB -continue Norvasc -continue to monitor   Elevated LFTs--on admission: ALP 164 and AST 41.  Prior ALP 138 04/06/12.   -f/u AM CMET -continue to monitor   Hyperthyroidism--elevated TSI 537 (03/2012).  04/10/2012: TSH 0.009 fT4: 2.45.  On Lopressor.   -f/u repeat TSH and fT4 -continue to monitor -holding BB   Hx of head/neck cancer--oropharyngeal carcinoma: 12/2009.  Followed by Dr. Lamonte Sakai at cancer center.  Completed chemotherapy and radiation. -consider repeat CT soft tissue neck -continue to monitor -f/u TSH fT4  Diet: Carb modified DVT Pps: Lovenox Dispo: Disposition is deferred at this time, awaiting improvement of current medical problems. Anticipated discharge in approximately 1-2 day(s).   The patient does have a current PCP Sissy Hoff, [place], MD), therefore will be requiring OPC follow-up after discharge.   The patient does not have transportation limitations that hinder transportation to clinic appointments.  SignedJerene Pitch 08/21/2012, 9:40 PM

## 2012-08-21 NOTE — ED Notes (Signed)
Pt stated that she has been having a cough x 1 week. She has been coughing white sputum. No blood in sputum. No SOB or CP per pt. She is currently alert and oriented with no cardiac distress. Will continue to monitor.

## 2012-08-21 NOTE — ED Provider Notes (Signed)
History     CSN: [id number]  Arrival date & time 08/21/12  1632   First MD Initiated Contact with Patient 08/21/12 1808      Chief Complaint  Patient presents with  . Shortness of Breath    (Consider location/radiation/quality/duration/timing/severity/associated sxs/prior treatment) HPI The patient presents with concerns of cough, congestion, generalized discomfort.  Symptoms began approximately one week ago.  Since onset symptoms have been progressive.  Shows a with generalized discomfort she is also generally fatigued.  She notes that while coughing she has congestion, difficulty clearing her throat.  She denies fever, chills, confusion.  There has been no clear alleviating or exacerbating factors of her symptoms.  She denies focal pain anywhere, though does state that her throat is sore.  She notes mild dysphagia.  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 As her symptoms progressed she spoke with her primary care team, was referred here for x-ray.  This is connected prior to arrival in the emergency department. History of present illness is per the patient and her daughter. Past Medical History  Diagnosis Date  . Diabetes mellitus   . Hyperlipidemia   . Hypertension   . GERD (gastroesophageal reflux disease)   . Tobacco abuse   . Iron deficiency anemia   . Menopause   . Ganglion cyst     right wrist  . Malignant neoplasm 04/11    oropharyngeal carcinoma    History reviewed. No pertinent past surgical history.  Family History  Problem Relation Age of Onset  . Stroke Father     History  Substance Use Topics  . Smoking status: Former Smoker -- 0.0 packs/day    Quit date: 02/09/2009  . Smokeless tobacco: Not on file  . Alcohol Use: No    OB History    Grav Para Term Preterm Abortions TAB SAB Ect Mult Living                  Review of Systems  Constitutional:       Per HPI, otherwise negative  HENT:       Per  HPI, otherwise negative  Eyes: Negative.   Respiratory:       Per HPI, otherwise negative  Cardiovascular:       Per HPI, otherwise negative  Gastrointestinal: Negative for vomiting.  Genitourinary: Negative.   Musculoskeletal:       Per HPI, otherwise negative  Skin: Negative.   Neurological: Positive for weakness. Negative for syncope.    Allergies  Penicillins  Home Medications   Current Outpatient Rx  Name  Route  Sig  Dispense  Refill  . AMITRIPTYLINE HCL 25 MG PO TABS   Oral   Take 25 mg by mouth at bedtime.           Marland Kitchen AMLODIPINE BESYLATE 5 MG PO TABS      TAKE 2 TABLETS BY MOUTH EVERY DAY   60 tablet   2   . ASPIRIN 81 MG PO TBEC   Oral   Take 81 mg by mouth daily.           Marland Kitchen BENZONATATE 100 MG PO CAPS   Oral   Take 1 capsule (100 mg total) by mouth 3 (three) times daily as needed for cough.   30 capsule   0   . GUAIFENESIN ER 600 MG PO TB12   Oral   Take 1 tablet (600 mg total) by mouth 2 (two)  times daily.   60 tablet   0   . HYDROCHLOROTHIAZIDE 25 MG PO TABS      TAKE 1 TABLET BY MOUTH EVERY DAY   62 tablet   4   . HYDROCODONE-ACETAMINOPHEN 5-500 MG PO TABS   Oral   Take 1 tablet by mouth every 6 (six) hours as needed for pain.   20 tablet   0   . INSULIN ASPART 100 UNIT/ML [place] SOLN   Subcutaneous   Inject into the skin. 5 units with breakfast, 10 units with lunch, and 8 units with dinner          . INSULIN GLARGINE 100 UNIT/ML [place] SOLN      Take 30 units subcutaneously in the morning and 20 units in the evening   10 mL   12   . LISINOPRIL 40 MG PO TABS      TAKE 1 TABLET BY MOUTH ONCE A DAY   31 tablet   2   . MELOXICAM 7.5 MG PO TABS   Oral   Take 1 tablet (7.5 mg total) by mouth daily as needed for pain.   30 tablet   0   . METOPROLOL TARTRATE 25 MG PO TABS      TAKE 1 TABLET BY MOUTH TWICE A DAY   60 tablet   5   . OMEPRAZOLE 20 MG PO CPDR      TAKE 1 CAPSULE (20 MG TOTAL) BY MOUTH 2 (TWO) TIMES DAILY.   60  capsule   1   . SIMVASTATIN 20 MG PO TABS      TAKE 1 TABLET (20 MG TOTAL) BY MOUTH DAILY.   30 tablet   5     BP 127/68  Pulse 90  Temp 97.8 F (36.6 C) (Oral)  Resp 24  SpO2 99%  Physical Exam  Nursing note and vitals reviewed. Constitutional: She is oriented to person, place, and time. She has a sickly appearance.       Elderly appearing female, clinical dehydrated  HENT:  Head: Normocephalic and atraumatic.  Nose: Nose normal.  Mouth/Throat: Uvula is midline. Mucous membranes are dry.       Audible congestion  Eyes: Conjunctivae normal and EOM are normal.  Neck: No tracheal deviation present.  Cardiovascular: Normal rate and regular rhythm.   Pulmonary/Chest: No stridor. Tachypnea noted. No respiratory distress. She has decreased breath sounds.  Abdominal: She exhibits no distension.  Musculoskeletal: She exhibits no edema.  Neurological: She is alert and oriented to person, place, and time. She displays atrophy. No cranial nerve deficit. Coordination normal.  Skin: Skin is warm and dry.  Psychiatric: She has a normal mood and affect.    ED Course  Procedures (including critical care time)  Labs Reviewed  CBC WITH DIFFERENTIAL - Abnormal; Notable for the following:    RBC 5.31 (*)     MCV 70.6 (*)     MCH 23.7 (*)     RDW 15.9 (*)     All other components within normal limits  COMPREHENSIVE METABOLIC PANEL - Abnormal; Notable for the following:    Sodium 119 (*)     Potassium 3.3 (*)     Chloride 80 (*)     Glucose, Bld 170 (*)     AST 41 (*)     Alkaline Phosphatase 164 (*)     Total Bilirubin 0.2 (*)     All other components within normal limits  TROPONIN I   Dg  Chest 2 View  08/21/2012  *RADIOLOGY REPORT*  Clinical Data: Cough, congestion.  CHEST - 2 VIEW  Comparison: Chest CT 10/26/2010  Findings: There is blunting of the left costophrenic angle suggesting small left pleural effusion.  Patchy opacities noted at the lung bases bilaterally,  atelectasis versus developing pneumonia.  Heart is normal size.  No acute bony abnormality.  IMPRESSION: Patchy bibasilar airspace opacities, atelectasis versus pneumonia.  Small left pleural effusion.   Original Report Authenticated By: Rolm Baptise, M.D.      1. Hyponatremia   2. Hypochloremia   3. Hypokalemia   4. Pleural effusion     Immediately after the initial evaluation, a review of the patient's labs demonstrated that she is notably hyponatremic.  Given the patient's clinical dehydration, she started on normal saline.  Pulse ox 99% room air normal   I reviewed the patient's x-ray, with her cough, congestion, fatigue, there is a suspicion for early pneumonia.  She was started on ceftriaxone, azithromycin.  7:46 PM I discussed the patient's case with her primary care team.  Given the significant left foot abnormalities, her comorbidities he'll be admitted for further evaluation and management  MDM  This elderly appearing female with history of tongue cancer, now presents with concerns of cough, congestion, generalized discomfort and fatigue.  On exam she is dehydrated, tachypneic, though her airway is patent.  Given the patient's description of symptoms, previous to connect x-ray, suspicion for pneumonia.  Notably, the patient's evaluation was also much of significant electrolyte abnormalities.  Given the dehydration, the hyponatremia, she started on normal saline.  No history of seizures, and the patient's preserved cognitive status is somewhat reassuring.  She was admitted for further evaluation and management.  CRITICAL CARE Performed by: Carmin Muskrat   Total critical care time: 35  Critical care time was exclusive of separately billable procedures and treating other patients.  Critical care was necessary to treat or prevent imminent or life-threatening deterioration.  Critical care was time spent personally by me on the following activities: development of treatment  plan with patient and/or surrogate as well as nursing, discussions with consultants, evaluation of patient's response to treatment, examination of patient, obtaining history from patient or surrogate, ordering and performing treatments and interventions, ordering and review of laboratory studies, ordering and review of radiographic studies, pulse oximetry and re-evaluation of patient's condition.         Carmin Muskrat, MD 08/21/12 1949

## 2012-08-21 NOTE — ED Notes (Signed)
Paged [place] to [phone]

## 2012-08-21 NOTE — Telephone Encounter (Signed)
Called patient to discuss results of recent chest x ray which showed small L pleural effusion. Concern for early pneumonia.  Patient states that she continues to have some congestion and is taking cold medicine around the clock. She does have subjective fever, taking tylenol, has not measured temp at home. She denies respiratory distress and states that her breathing is "fine" but she has white phlegm that feels like it is coming from her throat. She did sound raspy over the phone. As this patient's condition is not improving over 3 weeks time, I did recommend coming to ED to be evaluated for potential repeat CXR and possible neck imaging given her history of cancer. She said she would think about it but that she would call and make an appointment in clinic next week for sure.  Susan Evans

## 2012-08-21 NOTE — ED Notes (Signed)
The pt is c/o sob and chest congestion for one week.  She was seen at outpatient clinic today and had a chest xray doen.  She was called and told to come to the ed

## 2012-08-22 ENCOUNTER — Inpatient Hospital Stay (HOSPITAL_COMMUNITY): Payer: Medicaid Other

## 2012-08-22 LAB — COMPREHENSIVE METABOLIC PANEL
ALT: 23 U/L (ref 0–35)
Alkaline Phosphatase: 144 U/L — ABNORMAL HIGH (ref 39–117)
BUN: 5 mg/dL — ABNORMAL LOW (ref 6–23)
CO2: 28 mEq/L (ref 19–32)
GFR calc Af Amer: >90 mL/min (ref >90)
GFR calc non Af Amer: >90 mL/min (ref >90)
Glucose, Bld: 184 mg/dL — ABNORMAL HIGH (ref 70–99)
Potassium: 3.7 mEq/L (ref 3.5–5.1)
Sodium: 125 mEq/L — ABNORMAL LOW (ref 135–145)
Total Bilirubin: 0.2 mg/dL — ABNORMAL LOW (ref 0.3–1.2)

## 2012-08-22 LAB — URINALYSIS, ROUTINE W REFLEX MICROSCOPIC
Bilirubin Urine: NEGATIVE
Hgb urine dipstick: NEGATIVE
Protein, ur: 30 mg/dL — AB
Urobilinogen, UA: 0.2 mg/dL (ref 0.0–1.0)

## 2012-08-22 LAB — CBC
MCHC: 33 g/dL (ref 30.0–36.0)
Platelets: 243 10*3/uL (ref 150–400)
RDW: 15.6 % — ABNORMAL HIGH (ref 11.5–15.5)

## 2012-08-22 LAB — BASIC METABOLIC PANEL
Calcium: 9.2 mg/dL (ref 8.4–10.5)
Creatinine, Ser: 0.61 mg/dL (ref 0.50–1.10)
GFR calc Af Amer: >90 mL/min (ref >90)
Sodium: 127 mEq/L — ABNORMAL LOW (ref 135–145)

## 2012-08-22 LAB — INFLUENZA PANEL BY PCR (TYPE A & B)
H1N1 flu by pcr: NOT DETECTED
Influenza B By PCR: NEGATIVE

## 2012-08-22 LAB — URINE MICROSCOPIC-ADD ON

## 2012-08-22 LAB — SODIUM, URINE, RANDOM: Sodium, Ur: 56 mEq/L

## 2012-08-22 LAB — TSH: TSH: 40.093 u[IU]/mL — ABNORMAL HIGH (ref 0.350–4.500)

## 2012-08-22 LAB — HIV ANTIBODY (ROUTINE TESTING W REFLEX): HIV: NONREACTIVE

## 2012-08-22 MED ORDER — IOHEXOL 300 MG/ML  SOLN
75.0000 mL | Freq: Once | INTRAMUSCULAR | Status: AC | PRN
  Administered 2012-08-22: 75 mL via INTRAVENOUS

## 2012-08-22 MED ORDER — METOPROLOL TARTRATE 25 MG PO TABS
25.0000 mg | ORAL_TABLET | Freq: Two times a day (BID) | ORAL | Status: DC
  Administered 2012-08-22 – 2012-08-23 (×3): 25 mg via ORAL
  Filled 2012-08-22 (×5): qty 1

## 2012-08-22 MED ORDER — DEXTROSE 50 % IV SOLN: 1.0000 | INTRAVENOUS | Status: DC | PRN

## 2012-08-22 NOTE — H&P (Signed)
Internal Medicine teaching Service Attending Dr.Delmy Holdren. I have personally examined the patient and reviewed the h and P documented by the Resident. In brief  Chief complaint: feeling much better today and wants to go home HOPI: admitted for chest x ray concerning pneumonia and cough and wheeze  9 point review of system as documented in the Resident note. Social history admitting medication family history past surgical history allergies reviewed. Physical examination Notable for: chest clear to auscultation . Vitals stable and left sided facial swelling  Labs are significant for : hyponatremia improved  Imaging is significant for: as discussed above. Pending soft tissue neck  CT  EKG: NSR  A and P: Would restart on beta blocker and consider starting on methimazole for her Hyperthyroidism Doubt pneumonia. Continue current treatment check for influenza. Consider SLP evaluation for possible dysphagia Needs endocrinology follow up as outpatient. Rest per resident documentation.

## 2012-08-22 NOTE — Progress Notes (Signed)
Subjective: She states that her breathing today is a "whole lot better". She asks if she may be discharged today. She denies shortness of breath, chest pain, or abdominal pain.  Objective: Vital signs in last 24 hours: Filed Vitals:   08/21/12 2225 08/21/12 2313 08/22/12 0215 08/22/12 0538  BP: 111/66  106/64 151/84  Pulse: 85  100 89  Temp: 98.3 F (36.8 C)  98 F (36.7 C) 97.4 F (36.3 C)  TempSrc:   Oral Axillary  Resp: 16  13 14   Height:      Weight:      SpO2: 100% 99% 97% 100%   Weight change:   Intake/Output Summary (Last 24 hours) at 08/22/12 1337 Last data filed at 08/22/12 0600  Gross per 24 hour  Intake   1000 ml  Output      0 ml  Net   1000 ml   Vitals reviewed.  General: resting in bed, in NAD, takes large breath before speaking  HEENT: PERRLA, EOMI, b/l scleral icterus, dry mucous membranes, nostrils red, dry.  Neck: no masses palpated.  No lymphadenopathy  Cardiac: RRR, no rubs, murmurs or gallops  Pulm: audible upper airway sounds,  expiratory wheezing heard best at the anterior chest/upper lobes Abd: soft, nontender, nondistended, BS present  Ext: warm and well perfused, no pedal edema, dry skin  Neuro: alert and oriented X3, cranial nerves II-XII grossly intact, strength and sensation to light touch equal in bilateral upper and lower extremities     Lab Results: Basic Metabolic Panel:  Lab [id number] 08/21/12 2230  NA 125* 123*  K 3.7 4.1  CL 90* 83*  CO2 28 29  GLUCOSE 184* 218*  BUN 5* 7  CREATININE 0.52 0.51  CALCIUM 8.7 9.4  MG -- --  PHOS -- --   Liver Function Tests:  Lab 08/22/12 0640 08/21/12 1704  AST 29 41*  ALT 23 29  ALKPHOS 144* 164*  BILITOT 0.2* 0.2*  PROT 6.6 7.6  ALBUMIN 3.3* 3.8    CBC:  Lab 08/22/12 0640 08/21/12 1704  WBC 8.6 9.9  NEUTROABS -- 7.2  HGB 11.2* 12.6  HCT 33.9* 37.5  MCV 69.6* 70.6*  PLT 243 261   Cardiac Enzymes:  Lab 08/21/12 1705  CKTOTAL --  CKMB --  CKMBINDEX --  TROPONINI  <0.30   CBG:  Lab 08/21/12 2241  GLUCAP 217*   Urinalysis:  Lab 08/22/12 0040  COLORURINE YELLOW  LABSPEC 1.008  PHURINE 7.5  GLUCOSEU 250*  HGBUR NEGATIVE  BILIRUBINUR NEGATIVE  KETONESUR NEGATIVE  PROTEINUR 30*  UROBILINOGEN 0.2  NITRITE NEGATIVE  LEUKOCYTESUR NEGATIVE    Studies/Results: Dg Chest 2 View  08/21/2012  *RADIOLOGY REPORT*  Clinical Data: Cough, congestion.  CHEST - 2 VIEW  Comparison: Chest CT 10/26/2010  Findings: There is blunting of the left costophrenic angle suggesting small left pleural effusion.  Patchy opacities noted at the lung bases bilaterally, atelectasis versus developing pneumonia.  Heart is normal size.  No acute bony abnormality.  IMPRESSION: Patchy bibasilar airspace opacities, atelectasis versus pneumonia.  Small left pleural effusion.   Original Report Authenticated By: Rolm Baptise, M.D.    Medications: I have reviewed the patient's current medications. Scheduled Meds:    . amitriptyline  25 mg Oral QHS  . amLODipine  10 mg Oral Daily  . aspirin  81 mg Oral Daily  . [COMPLETED] azithromycin  500 mg Intravenous Once  . [COMPLETED] cefTRIAXone (ROCEPHIN)  IV  1 g Intravenous Once  .  enoxaparin (LOVENOX) injection  40 mg Subcutaneous Q24H  . guaiFENesin  600 mg Oral BID  . insulin aspart  0-9 Units Subcutaneous TID WC  . insulin glargine  25 Units Subcutaneous QHS  . metoprolol tartrate  25 mg Oral BID  . pantoprazole  20 mg Oral BID AC  . pneumococcal 23 valent vaccine  0.5 mL Intramuscular Tomorrow-1000  . [COMPLETED] potassium chloride  40 mEq Oral Once  . simvastatin  20 mg Oral q1800  . [COMPLETED] sodium chloride  1,000 mL Intravenous Once  . [DISCONTINUED] aspirin  81 mg Oral Daily  . [DISCONTINUED] methylPREDNISolone sodium succinate  125 mg Intravenous Once  . [DISCONTINUED] pantoprazole  20 mg Oral BID AC  . [DISCONTINUED] predniSONE  60 mg Oral Q breakfast   Continuous Infusions:    . sodium chloride 125 mL/hr at  08/22/12 1123   PRN Meds:.albuterol, benzonatate, HYDROcodone-acetaminophen, [COMPLETED] iohexol, ipratropium, meloxicam, menthol-cetylpyridinium Assessment/Plan: Ms. Mccarty is a 56 year old African American female with PMH of DM II, HL, HTN, GERD, anemia, and Oropharyngeal carcinoma admitted for worsening productive cough and hyponatremia   Cough--productive cough with white sputum worsening x1 week. Possibly secondary to viral URI vs. PNA vs. GERD. CXR on admission shows patchy bibasilar airspace opacities, atelectasis vs. pna and small left pleural effusion, however afebrile and no leukocytosis with no definite infiltrates or consolidation seen on cxr. 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 Given 1L NS bolus, Rocephin 1g IV and Azithromax 500mg  IV x1 in ED.  -f/u blood cx x2--pending, drawn after abx started in ED  -U/A---250 glucose, 30 protein, negative nitrite, leukocytes, and Hb with 0-2 wbc.  -HIV pending -mucinex  -tessalon perles  -cepacol lozenge  -duonebz Q4H PRN  -o2 therapy prn, keep o2 sat >92%  -IVF  -Protonix  -f/u CT soft tissue neck--last one 02/2011 showing stable post-therapy appearance of neck.   Hyponatremia--Na 119 on admission. Unknown etiology, likely secondary to hypovolemia but consideration can be given for possible euvolemic causes as well including hypothyroidism. Given 1L NS bolus in ED. Na at 125 this morning (correction rate appropriate). -NS @125ml /hr -TSH, fT4, pending -am cortisol pending -U/A-250 glucose, 30 protein, negative nitrite, leukocytes, and Hb with 0-2 wbc.  -FeNA--0.1% suggestive of pre-renal. urine Na (56), urine Cr (21.16)  -regular diet -f/u BMETs (repeat at [facility])  DIABETES MELLITUS, TYPE II--last HbA1C: 7.4 08/13/12. Glucose 170 on admission. On home regimen: Lantus 30 units [place] in AM and 20 units in PM, Novolog 5 units in AM, 10 units with lunch, and 8 units PM    -CBG monitoring  -SSI sensitive  -Lantus 25 units QHS   HYPERTENSION--BP on admission 140/75-->151/84 this AM. On home regimen: HCTZ 25mg , Lisinopril 40mg , Norvasc 10mg , and Lopressor 25mg  BID.  -hold HCTZ, and ACEI,  -continue Norvasc  -Continue BB in setting of possibly thyrotoxicosis -continue to monitor   Elevated LFTs--on admission: ALP 164 and AST 41. Prior ALP 138 04/06/12. Alk phos down to 144 and AST normal at 29.  -f/u AM CMET  -continue to monitor   Hyperthyroidism--elevated TSI 537 (03/2012). 04/10/2012: TSH 0.009 fT4: 2.45. On Lopressor. Given elevated TSI this is likely Grave's disease. She reports that she has seen Dr. Buddy Duty in Endocrinology. She states that she takes a medications daily and per Dr. Buddy Duty she is not a good candidate for surgery or Radioiodine ablation. [facility] she filled prescription on 07/18/12 for methimazole 10mg   BID. She states that she was taking half a pill daily recently as instructed by Dr. Buddy Duty. Her TSH today is 40.093 (highly elevated), fT4 0.37.  -She will need to f/u with Dr. Buddy Duty in Endocrinology early next week for further management of this problem.  -continue to monitor  -Continue Lopressor  Hypothyroidism. TSH elevated to 40.093. Thought to be likely iatrogenic. Plan per above.  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 Followed by Dr. Lamonte Sakai at cancer center. Completed chemotherapy and radiation.  -Will f/u on repeat CT soft tissue neck  -continue to monitor   Diet: Carb modified   DVT Pps: Lovenox   Dispo: Disposition is deferred at this time, awaiting improvement of current medical problems. Anticipated discharge in approximately 1-2 day(s).  The patient does have a current PCP Sissy Hoff, [place], [place]), therefore will be requiring OPC follow-up after discharge.  The patient does not have transportation limitations that hinder transportation to clinic appointments.    LOS: 1 day   Blain Pais 08/22/2012,  1:37 PM

## 2012-08-23 DIAGNOSIS — E039 Hypothyroidism, unspecified: Secondary | ICD-10-CM

## 2012-08-23 LAB — COMPREHENSIVE METABOLIC PANEL
ALT: 25 U/L (ref 0–35)
AST: 32 U/L (ref 0–37)
Albumin: 3.3 g/dL — ABNORMAL LOW (ref 3.5–5.2)
Alkaline Phosphatase: 153 U/L — ABNORMAL HIGH (ref 39–117)
Chloride: 92 mEq/L — ABNORMAL LOW (ref 96–112)
Potassium: 3.3 mEq/L — ABNORMAL LOW (ref 3.5–5.1)
Sodium: 129 mEq/L — ABNORMAL LOW (ref 135–145)
Total Bilirubin: 0.2 mg/dL — ABNORMAL LOW (ref 0.3–1.2)

## 2012-08-23 LAB — GLUCOSE, CAPILLARY
Glucose-Capillary: 157 mg/dL — ABNORMAL HIGH (ref 70–99)
Glucose-Capillary: 152 mg/dL — ABNORMAL HIGH (ref 70–99)

## 2012-08-23 LAB — CBC
Platelets: 257 10*3/uL (ref 150–400)
RBC: 4.88 MIL/uL (ref 3.87–5.11)
RDW: 15.9 % — ABNORMAL HIGH (ref 11.5–15.5)
WBC: 7.7 10*3/uL (ref 4.0–10.5)

## 2012-08-23 MED ORDER — SODIUM CHLORIDE 0.9 % IV BOLUS (SEPSIS)
500.0000 mL | Freq: Once | INTRAVENOUS | Status: AC
  Administered 2012-08-23: 500 mL via INTRAVENOUS

## 2012-08-23 MED ORDER — POTASSIUM CHLORIDE 20 MEQ/15ML (10%) PO LIQD
40.0000 meq | Freq: Once | ORAL | Status: AC
  Administered 2012-08-23: 40 meq via ORAL
  Filled 2012-08-23: qty 30

## 2012-08-23 NOTE — Telephone Encounter (Signed)
I am off and not covering on this day, and I am not sure why this message has been directed to me. I am resending it back to the sender and the doctor on call, Dr. Margot Ables. Please review. Thanks, Madilyn Fireman

## 2012-08-23 NOTE — Progress Notes (Signed)
Subjective: No acute events overnight.  No chest pain, SOB.  Patient asks if she can be discharged today.  Sodium has continued to improve overnight.  Objective: Vital signs in last 24 hours: Filed Vitals:   08/22/12 1850 08/22/12 2114 08/22/12 2215 08/23/12 0605  BP: 151/66 135/59 150/56 150/80  Pulse: 88 84 86 89  Temp: 98.2 F (36.8 C)  98 F (36.7 C) 98.3 F (36.8 C)  TempSrc: Oral  Oral Oral  Resp: 20  18 18   Height:      Weight:      SpO2: 99%  99% 92%   Weight change:   Intake/Output Summary (Last 24 hours) at 08/23/12 0925 Last data filed at 08/23/12 [id number]  Gross per 24 hour  Intake   2480 ml  Output   1300 ml  Net   1180 ml   PEX General: alert, cooperative, NAD, though significant upper airway sounds still heard with respiration. HEENT: pupils equal round and reactive to light, vision grossly intact, oropharynx clear and non-erythematous  Neck: supple, no lymphadenopathy, no JVD Lungs: clear to ascultation bilaterally, normal work of respiration, no wheezes, rales, ronchi Heart: regular rate and rhythm, no murmurs, gallops, or rubs Abdomen: soft, non-tender, non-distended, normal bowel sounds Extremities: 2+ DP/PT pulses bilaterally, no cyanosis, clubbing, or edema Neurologic: alert & oriented X3, cranial nerves II-XII intact, strength grossly intact, sensation intact to light touch   Lab Results: Basic Metabolic Panel:  Lab [id number] 0645 08/22/12 1411  NA 129* 127*  K 3.3* 3.4*  CL 92* 90*  CO2 29 29  GLUCOSE 107* 106*  BUN 5* 4*  CREATININE 0.53 0.61  CALCIUM 8.7 9.2  MG -- --  PHOS -- --   Liver Function Tests:  Lab 08/23/12 0645 08/22/12 0640  AST 32 29  ALT 25 23  ALKPHOS 153* 144*  BILITOT 0.2* 0.2*  PROT 6.8 6.6  ALBUMIN 3.3* 3.3*    CBC:  Lab 08/23/12 0645 08/22/12 0640 08/21/12 1704  WBC 7.7 8.6 --  NEUTROABS -- -- 7.2  HGB 11.3* 11.2* --  HCT 34.7* 33.9* --  MCV 71.1* 69.6* --  PLT 257 243 --   Cardiac Enzymes:  Lab  08/21/12 1705  CKTOTAL --  CKMB --  CKMBINDEX --  TROPONINI <0.30   CBG:  Lab 08/22/12 2204 08/22/12 1713 08/22/12 1204 08/22/12 0748 08/21/12 2241  GLUCAP 191* 152* 153* 157* 217*   Urinalysis:  Lab 08/22/12 0040  COLORURINE YELLOW  LABSPEC 1.008  PHURINE 7.5  GLUCOSEU 250*  HGBUR NEGATIVE  BILIRUBINUR NEGATIVE  KETONESUR NEGATIVE  PROTEINUR 30*  UROBILINOGEN 0.2  NITRITE NEGATIVE  LEUKOCYTESUR NEGATIVE    Studies/Results: Dg Chest 2 View  08/21/2012  *RADIOLOGY REPORT*  Clinical Data: Cough, congestion.  CHEST - 2 VIEW  Comparison: Chest CT 10/26/2010  Findings: There is blunting of the left costophrenic angle suggesting small left pleural effusion.  Patchy opacities noted at the lung bases bilaterally, atelectasis versus developing pneumonia.  Heart is normal size.  No acute bony abnormality.  IMPRESSION: Patchy bibasilar airspace opacities, atelectasis versus pneumonia.  Small left pleural effusion.   Original Report Authenticated By: Rolm Baptise, M.D.    Ct Soft Tissue Neck W Contrast  08/22/2012  *RADIOLOGY REPORT*  Clinical Data: History of oral pharyngeal cancer.  Cough  CT NECK WITH CONTRAST  Technique:  Multidetector CT imaging of the neck was performed with intravenous contrast.  Contrast: 103mL OMNIPAQUE IOHEXOL 300 MG/ML  SOLN  Comparison: CT neck 10/26/2010  Findings: Visualized intracranial contents are normal.  Paranasal sinuses are clear.  No acute bony abnormality.  The tongue and oropharynx are normal.  Para pharyngeal soft tissues are normal.  Larynx is symmetric and normal.  No mass or adenopathy in the neck.  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  Similar changes in the right submandibular gland.  Left submandibular gland is atrophic or surgically resected and not visualized.  This is unchanged from the prior study.  Extensive atherosclerotic calcification is present diffusely.  Sub centimeter right thyroid nodule is stable.   IMPRESSION: Post radiation changes.  No mass or adenopathy is present.   Original Report Authenticated By: Carl Best, M.D.    Medications: I have reviewed the patient's current medications. Scheduled Meds:    . amitriptyline  25 mg Oral QHS  . amLODipine  10 mg Oral Daily  . aspirin  81 mg Oral Daily  . enoxaparin (LOVENOX) injection  40 mg Subcutaneous Q24H  . guaiFENesin  600 mg Oral BID  . insulin aspart  0-9 Units Subcutaneous TID WC  . insulin glargine  25 Units Subcutaneous QHS  . metoprolol tartrate  25 mg Oral BID  . pantoprazole  20 mg Oral BID AC  . [COMPLETED] pneumococcal 23 valent vaccine  0.5 mL Intramuscular Tomorrow-1000  . potassium chloride  40 mEq Oral Once  . simvastatin  20 mg Oral q1800  . sodium chloride  500 mL Intravenous Once   Continuous Infusions:    . [DISCONTINUED] sodium chloride 125 mL/hr at 08/23/12 0402   PRN Meds:.albuterol, benzonatate, dextrose, HYDROcodone-acetaminophen, [COMPLETED] iohexol, ipratropium, meloxicam, menthol-cetylpyridinium Assessment/Plan: Susan Evans is a 56 year old African American female with PMH of DM II, HL, HTN, GERD, anemia, and Oropharyngeal carcinoma admitted for worsening productive cough and hyponatremia   Cough--productive cough with white sputum worsening x1 week. CXR on admission showed questionable opacity, but without fever or leukocytosis, not convincing for pneumonia, more likely atelectasis.  Symptoms more likely represent GERD vs post-nasal drip in the setting of seasonal allergies.  Symptoms resolved by day 2 of hospitalization with conservative measures.  The patient is s/p 1 dose of ceftriaxone and azithromycin in ED> -mucinex  -tessalon perles  -cepacol lozenge  -duonebs Q4H PRN  -Protonix  -CT soft tissue neck shows no new masses or changes since 2012  Hyponatremia--Na 119 on admission, likely due to volume depletion (patient struggles with PO intake due to radiation changes to salivary glands  producing dry mouth).  There may also be some component of hypothyroidism given TSH = 40.093.   Sodium has improved to 129, and I suspect will continue to improve. -FeNA--0.1% suggestive of pre-renal. urine Na (56), urine Cr (21.16)  -regular diet -check BMET at outpatient follow-up  DIABETES MELLITUS, TYPE II--last HbA1C: 7.4 08/13/12. Glucose 170 on admission. On home regimen: Lantus 30 units [place] in AM and 20 units in PM, Novolog 5 units in AM, 10 units with lunch, and 8 units PM  -CBG monitoring  -SSI sensitive  -Lantus 25 units QHS   HYPERTENSION--BP on admission 140/75-->151/84 this AM. On home regimen: HCTZ 25mg , Lisinopril 40mg , Norvasc 10mg , and Lopressor 25mg  BID.  -hold HCTZ, and ACEI,  -continue Norvasc, beta blocker -continue to monitor   Hyperthyroidism--elevated TSI 537 (03/2012). 04/10/2012: TSH 0.009 fT4: 2.45. On Lopressor. Given elevated TSI this is likely Grave's disease. She reports that she has seen Dr. Buddy Duty in Endocrinology. She states that she takes a medications daily and per Dr. Buddy Duty she  is not a good candidate for surgery or Radioiodine ablation. [facility] prescription on 07/18/12 for methimazole 10mg  BID. She states that she was taking half a pill daily recently as instructed by Dr. Buddy Duty. Her TSH today is 40.093 (highly elevated), fT4 0.37.  -She will need to f/u with Dr. Buddy Duty in Endocrinology early next week for further management of this problem.  -continue to monitor  -Continue Lopressor  Hypothyroidism. TSH elevated to 40.093. Thought to be likely iatrogenic. Plan per above.  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 Followed by Dr. Lamonte Sakai at cancer center. Completed chemotherapy and radiation.  -repeat CT neck unremarkable -continue to monitor   Diet: Carb modified   DVT Pps: Lovenox   Dispo: Plan for discharge today The patient does have a current PCP Sissy Hoff, [place], MD), therefore will be requiring OPC follow-up after  discharge.  The patient does not have transportation limitations that hinder transportation to clinic appointments.    LOS: 2 days   Elnora Morrison 08/23/2012, 9:25 AM

## 2012-08-23 NOTE — Discharge Summary (Signed)
[facility] Discharge Note  Name: Susan Evans MRN: [id number] DOB: 1955-12-21 56 y.o.  Date of Admission: 08/21/2012  6:06 PM Date of Discharge: 08/23/2012 Attending Physician: Dr. Margot Ables  Discharge Diagnosis: 1. Hyponatremia - Na = 119, likely volume depletion vs hypothyroidism 2. Cough - resolved, likely post-nasal drip 3. Hypothyroidism - previously hyperthyroid, stopped methimazole 4. Hypertension - stopped HCTZ due to volume depletion on admission 5. History of oropharyngeal carcinoma - repeat CT neck this admission unchanged from prior  Discharge Medications:   Medication List     As of 08/23/2012  9:47 AM    STOP taking these medications         hydrochlorothiazide 25 MG tablet   Commonly known as: HYDRODIURIL      TAKE these medications         amitriptyline 25 MG tablet   Commonly known as: ELAVIL   Take 25 mg by mouth at bedtime.      amLODipine 5 MG tablet   Commonly known as: NORVASC   TAKE 2 TABLETS BY MOUTH EVERY DAY      aspirin 81 MG EC tablet   Take 81 mg by mouth daily.      benzonatate 100 MG capsule   Commonly known as: TESSALON   Take 1 capsule (100 mg total) by mouth 3 (three) times daily as needed for cough.      guaiFENesin 600 MG 12 hr tablet   Commonly known as: MUCINEX   Take 1 tablet (600 mg total) by mouth 2 (two) times daily.      HYDROcodone-acetaminophen 5-500 MG per tablet   Commonly known as: VICODIN   Take 1 tablet by mouth every 6 (six) hours as needed for pain.      insulin glargine 100 UNIT/ML injection   Commonly known as: LANTUS   Take 30 units subcutaneously in the morning and 20 units in the evening      lisinopril 40 MG tablet   Commonly known as: PRINIVIL,ZESTRIL   TAKE 1 TABLET BY MOUTH ONCE A DAY      meloxicam 7.5 MG tablet   Commonly known as: MOBIC   Take 1 tablet (7.5 mg total) by mouth daily as needed for pain.      metoprolol tartrate 25 MG tablet   Commonly known  as: LOPRESSOR   TAKE 1 TABLET BY MOUTH TWICE A DAY      NOVOLOG FLEXPEN 100 UNIT/ML injection   Generic drug: insulin aspart   Inject into the skin. 5 units with breakfast, 10 units with lunch, and 8 units with dinner      omeprazole 20 MG capsule   Commonly known as: PRILOSEC   TAKE 1 CAPSULE (20 MG TOTAL) BY MOUTH 2 (TWO) TIMES DAILY.      simvastatin 20 MG tablet   Commonly known as: ZOCOR   TAKE 1 TABLET (20 MG TOTAL) BY MOUTH DAILY.        Disposition and follow-up:   Susan Evans was discharged from [facility] in stable and improved condition, with resolution of cough and improvement in hyponatremia.  At hospital follow-up, please address: 1. Check BMET to ensure sodium has normalized 2. Ensure patient has an upcoming appointment with Dr. Buddy Duty.  If no appointment in the next 1-2 weeks, please recheck TSH, free T4, free T3.  Follow-up Appointments:  Follow-up Information    Follow up with Santa Lighter, MD. Schedule an appointment as soon as  possible for a visit in 1 week.   Contact information:   [address] [place] [address] [phone]       Follow up with KERR,JEFFREY, MD. Schedule an appointment as soon as possible for a visit in 1 week.   Contact information:   [place] [address] [phone]       Follow up with Francina Ames, MD. Schedule an appointment as soon as possible for a visit in 1 week.             Discharge Orders    Future Appointments: Provider: Department: Dept Phone: Center:   10/07/2012 10:45 AM [place] [phone] None   10/07/2012 11:15 AM Maryanna Shape, NP [place] [phone] None     Future Orders Please Complete By Expires   Diet - low sodium heart healthy      Increase activity slowly      Discharge instructions      Comments:    STOP taking Hydrochlorothiazide.  This medication can lead to dehydration.  We may restart this medication in the outpatient clinic at a later time if your electrolytes improve. STOP taking Methimazole.  Dr. Buddy Duty will advise you whether to restart taking this medication.   Call MD for:  temperature >100.4      Call MD for:  difficulty breathing, headache or visual disturbances      Call MD for:  persistant dizziness or light-headedness         Consultations: None  Procedures Performed:  Dg Chest 2 View  08/21/2012  *RADIOLOGY REPORT*  Clinical Data: Cough, congestion.  CHEST - 2 VIEW  Comparison: Chest CT 10/26/2010  Findings: There is blunting of the left costophrenic angle suggesting small left pleural effusion.  Patchy opacities noted at the lung bases bilaterally, atelectasis versus developing pneumonia.  Heart is normal size.  No acute bony abnormality.  IMPRESSION: Patchy bibasilar airspace opacities, atelectasis versus pneumonia.  Small left pleural effusion.   Original Report Authenticated By: Rolm Baptise, M.D.    Ct Soft Tissue Neck W Contrast  08/22/2012  *RADIOLOGY REPORT*  Clinical Data: History of oral pharyngeal cancer.  Cough  CT NECK WITH CONTRAST  Technique:  Multidetector CT imaging of the neck was performed with intravenous contrast.  Contrast: 33mL OMNIPAQUE IOHEXOL 300 MG/ML  SOLN  Comparison: CT neck 10/26/2010  Findings: Visualized intracranial contents are normal.  Paranasal sinuses are clear.  No acute bony abnormality.  The tongue and oropharynx are normal.  Para pharyngeal soft tissues are normal.  Larynx is symmetric and normal.  No mass or adenopathy in the neck.  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  Similar changes in the right submandibular gland.  Left submandibular gland is atrophic or surgically resected and not visualized.  This is unchanged from the prior study.  Extensive atherosclerotic calcification is present diffusely.   Sub centimeter right thyroid nodule is stable.  IMPRESSION: Post radiation changes.  No mass or adenopathy is present.   Original Report Authenticated By: Carl Best, M.D.    Mm Digital Diagnostic Unilat L  07/29/2012  *RADIOLOGY REPORT*  Clinical Data:  Milky discharge left breast  DIGITAL DIAGNOSTIC LEFT MAMMOGRAM WITH CAD  Comparison:  Feb 08, 2008, August 30, 2010, April 08, 2012  Findings:  CC and MLO views of the left breast are submitted.  No suspicious abnormality is  identified. Mammographic images were processed with CAD.  Physical exam:  The patient expressed milky discharge from the left nipple.  This is benign.  IMPRESSION: Benign findings.  RECOMMENDATION: Routine screening mammogram back on schedule.  I have discussed the findings and recommendations with the patient. Results were also provided in writing at the conclusion of the visit.  BI-RADS CATEGORY 2:  Benign finding(s).   Original Report Authenticated By: Abelardo Diesel, M.D.     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 She claims for the past week she has been coughing more with white phlegm and for the past few days has started having "noisy breathing" as per family. She was recently seen in [facility] on 08/13/12 for similar complaints and suspected to have viral URI and was schedule to have a CXR done at that time. CXR done today revealed patchy bibasilar airspace opacities and small left pleural effusion. Susan Evans does complain of associated runny nose, occasional congestion, and feeling something at the back of her throat but denies any headaches, fever, chills, N/V/D, chest pain, shortness of breath, abdominal pain, or any urinary complaints at this time.   Admission Physical Exam Blood pressure 140/75, pulse 88, temperature 97.8 F  (36.6 C), temperature source Oral, resp. rate 24, height 5' 4.5" (1.638 m), weight 123 lb 0.3 oz (55.8 kg), SpO2 100.00%.  Vitals reviewed.  General: resting in bed, NAD, takes large breath before speaking  HEENT: PERRLA, EOMI, b/l scleral icterus, dry mucous membranes, nostrils red, dry.  Neck: no masses palpated. -lymphadenopathy  Cardiac: RRR, no rubs, murmurs or gallops  Pulm: audible upper airway sounds, b/l rhonchi, diffuse expiratory wheezing  Abd: soft, nontender, nondistended, BS present  Ext: warm and well perfused, no pedal edema, dry skin  Neuro: alert and oriented X3, cranial nerves II-XII grossly intact, strength and sensation to light touch equal in bilateral upper and lower extremities   Admission Labs Basic Metabolic Panel:   Basename  08/21/12 1704   NA  119*   K  3.3*   CL  80*   CO2  27   GLUCOSE  170*   BUN  8   CREATININE  0.66   CALCIUM  9.8   MG  --   PHOS  --    Liver Function Tests:   [facility]  08/21/12 1704   AST  41*   ALT  29   ALKPHOS  164*   BILITOT  0.2*   PROT  7.6   ALBUMIN  3.8    CBC:   Basename  08/21/12 1704   WBC  9.9   NEUTROABS  7.2   HGB  12.6   HCT  37.5   MCV  70.6*   PLT  261    TSH = 40.093 Free T4 = 0.37  Hospital Course by problem list: 1. Hyponatremia - The patient presented with hyponatremia, with Na = 119.  This was likely due to volume depletion in the setting of poor PO intake, which is a chronic problem for the patient due to decreased salivation from radiation changes to her head/neck.  The patient was also found to have a TSH of 40, implicating hypothyroidism as a potential cause of hyponatremia.  IV fluids were started, and the patient's sodium improved appropriately to 129 by the morning  of discharge.  We will follow-up with another BMET at her clinic follow-up visit to ensure sodium has normalized.  2. Cough - the patient presented with a 1-week history of cough productive of white sputum.  Initial chest  x-ray showed a possible opacity, initially concerning for pneumonia, and the patient received 1 dose of ceftriaxone and azithromycin.  Upon further review of CXR, opacity more likely represents atelectasis, and patient is afebrile without leukocytosis.  Cough resolved on day 2 of hospitalization with supportive measures.  CT head/neck was repeated given patient's history of oropharyngeal carcinoma, to ensure that no new mass was contributing to her current symptoms, and CT was unchanged from prior scan 02/2011.  3. Hypothyroidism - The patient has a history of hyperthyroidism, with TSH = 0.009 on 04/10/12.  However, the patient now presented with a TSH of 40.093 and free T4 = 0.37 (08/22/12), though with no overt symptoms of hypothyroidism (though patient did have hyponatremia).  After calling the patient's pharmacy, she has recently filled prescriptions for Methimazole (which did not appear on her hospital medication reconcilation), most recently at a dose of 5 mg/day.  The patient was instructed to stop methimazole, and will follow-up with her endocrinologist.  (Synthroid was not started as the patient was asymptomatic, and condition is presumed to be drug-induced)  4. Hypertension - The patient has a history of HTN.  HCTZ and lisinopril were held on admission due to hyponatremia and concern for volume depletion.  Lisinopril was restarted on hospital discharge, but HCTZ was discontinued out of concern for further episodes of volume depletion in the setting of head and neck post-radiation changes.  This med may be restarted in the outpatient setting if clinically appropriate.  5. History of oropharyngeal carcinoma - Repeat CT neck this admission unchanged from prior.  However, the patient has significant upper airway sounds with respiration, interfering with speech.  She states that this has been stable for weeks to months.  We will have the patient follow-up with her ENT physician for further management, and  consideration of laryngoscopy if clinically appropriate.  Time spent on discharge: 45 minutes  Discharge Vitals:  BP 171/86  Pulse 96  Temp 98.3 F (36.8 C) (Oral)  Resp 18  Ht 5' 4.5" (1.638 m)  Wt 123 lb 0.3 oz (55.8 kg)  BMI 20.79 kg/m2  SpO2 92%  Discharge Labs:  Results for orders placed during the hospital encounter of 08/21/12 (from the past 24 hour(s))  INFLUENZA PANEL BY PCR     Status: Normal   Collection Time   08/22/12 10:58 AM      Component Value Range   Influenza A By PCR NEGATIVE  NEGATIVE   Influenza B By PCR NEGATIVE  NEGATIVE   H1N1 flu by pcr NOT DETECTED  NOT DETECTED  GLUCOSE, CAPILLARY     Status: Abnormal   Collection Time   08/22/12 12:04 PM      Component Value Range   Glucose-Capillary 153 (*) 70 - 99 mg/dL   Comment 1 Notify RN    BASIC METABOLIC PANEL     Status: Abnormal   Collection Time   08/22/12  2:11 PM      Component Value Range   Sodium 127 (*) 135 - 145 mEq/L   Potassium 3.4 (*) 3.5 - 5.1 mEq/L   Chloride 90 (*) 96 - 112 mEq/L   CO2 29  19 - 32 mEq/L   Glucose, Bld 106 (*) 70 - 99 mg/dL   BUN  4 (*) 6 - 23 mg/dL   Creatinine, Ser 0.61  0.50 - 1.10 mg/dL   Calcium 9.2  8.4 - 10.5 mg/dL   GFR calc non Af Amer >90  >90 mL/min   GFR calc Af Amer >90  >90 mL/min  GLUCOSE, CAPILLARY     Status: Abnormal   Collection Time   08/22/12  5:13 PM      Component Value Range   Glucose-Capillary 152 (*) 70 - 99 mg/dL   Comment 1 Notify RN    GLUCOSE, CAPILLARY     Status: Abnormal   Collection Time   08/22/12 10:04 PM      Component Value Range   Glucose-Capillary 191 (*) 70 - 99 mg/dL  CBC     Status: Abnormal   Collection Time   08/23/12  6:45 AM      Component Value Range   WBC 7.7  4.0 - 10.5 K/uL   RBC 4.88  3.87 - 5.11 MIL/uL   Hemoglobin 11.3 (*) 12.0 - 15.0 g/dL   HCT 34.7 (*) 36.0 - 46.0 %   MCV 71.1 (*) 78.0 - 100.0 fL   MCH 23.2 (*) 26.0 - 34.0 pg   MCHC 32.6  30.0 - 36.0 g/dL   RDW 15.9 (*) 11.5 - 15.5 %   Platelets  257  150 - 400 K/uL  COMPREHENSIVE METABOLIC PANEL     Status: Abnormal   Collection Time   08/23/12  6:45 AM      Component Value Range   Sodium 129 (*) 135 - 145 mEq/L   Potassium 3.3 (*) 3.5 - 5.1 mEq/L   Chloride 92 (*) 96 - 112 mEq/L   CO2 29  19 - 32 mEq/L   Glucose, Bld 107 (*) 70 - 99 mg/dL   BUN 5 (*) 6 - 23 mg/dL   Creatinine, Ser 0.53  0.50 - 1.10 mg/dL   Calcium 8.7  8.4 - 10.5 mg/dL   Total Protein 6.8  6.0 - 8.3 g/dL   Albumin 3.3 (*) 3.5 - 5.2 g/dL   AST 32  0 - 37 U/L   ALT 25  0 - 35 U/L   Alkaline Phosphatase 153 (*) 39 - 117 U/L   Total Bilirubin 0.2 (*) 0.3 - 1.2 mg/dL   GFR calc non Af Amer >90  >90 mL/min   GFR calc Af Amer >90  >90 mL/min  GLUCOSE, CAPILLARY     Status: Abnormal   Collection Time   08/23/12  9:40 AM      Component Value Range   Glucose-Capillary 167 (*) 70 - 99 mg/dL   Comment 1 Notify RN      Signed: Elnora Morrison 08/23/2012, 9:47 AM

## 2012-08-23 NOTE — Progress Notes (Signed)
Pt and grand daughter given discharge instructions.  They verbalized understanding of all instructions,  RN highlighted the 2 meds that she was to stop taking and also the follow-up appts.  Pt understood.  Pt was offered to be taken down via wheelchair for discharge but she refused.  She ambulated out with her family.

## 2012-08-24 LAB — GLUCOSE, CAPILLARY

## 2012-08-26 ENCOUNTER — Encounter: Payer: Self-pay | Admitting: Radiation Oncology

## 2012-08-26 ENCOUNTER — Ambulatory Visit (INDEPENDENT_AMBULATORY_CARE_PROVIDER_SITE_OTHER): Payer: Medicaid Other | Admitting: Radiation Oncology

## 2012-08-26 VITALS — BP 119/85 | HR 82 | Temp 98.5°F | Ht 61.0 in | Wt 124.0 lb

## 2012-08-26 DIAGNOSIS — E0590 Thyrotoxicosis, unspecified without thyrotoxic crisis or storm: Secondary | ICD-10-CM

## 2012-08-26 DIAGNOSIS — I10 Essential (primary) hypertension: Secondary | ICD-10-CM

## 2012-08-26 DIAGNOSIS — E871 Hypo-osmolality and hyponatremia: Secondary | ICD-10-CM

## 2012-08-26 LAB — BASIC METABOLIC PANEL
CO2: 24 mEq/L (ref 19–32)
Chloride: 84 mEq/L — ABNORMAL LOW (ref 96–112)
Creat: 0.62 mg/dL (ref 0.50–1.10)

## 2012-08-26 MED ORDER — LISINOPRIL 40 MG PO TABS: 40.0000 mg | ORAL_TABLET | Freq: Every day | ORAL | Status: DC

## 2012-08-26 NOTE — Progress Notes (Signed)
  Subjective:    Patient ID: Susan Evans, female    DOB: 1955-12-12, 56 y.o.   MRN: [id number]  HPI Pt is a 56 yo woman with PMH hyperthyroidism, oropharyngeal cancer, HTN, who presents for hospital f/u after recent admission on 11/29 for hyponatremia. At time of discharge pt's HCTZ was d/c'd, as it was thought to likely be the cause of her hyponatremia. Pt was also found to have a highly elevated TSH 2/2 to methimazole treatment for hyperthyroidism, and methimazole was d/c'd at time of discharge. Pt states she has no complaints today, and feels entirely better since her hospital admission. She states she has a f/u appt with Dr. Buddy Duty this afternoon for f/u on her hyperthyroidism and further management of this issue. Pt is requesting a handicap sticker for her car on her visit today, however this issue will be deferred to her PCP (particularly as pt has no obvious physical limitations that would seem to require this).    Review of Systems  Constitutional: Negative for fever, chills and activity change.  HENT: Negative for neck pain and neck stiffness.   Eyes: Negative.   Respiratory: Negative for cough, shortness of breath and wheezing.   Cardiovascular: Negative for chest pain, palpitations and leg swelling.  Gastrointestinal: Negative for nausea, vomiting, abdominal pain, diarrhea and blood in stool.  Genitourinary: Negative for dysuria and hematuria.  Musculoskeletal: Negative.   Skin: Negative for rash and wound.  Neurological: Negative for light-headedness, numbness and headaches.  Hematological: Bruises/bleeds easily.  Psychiatric/Behavioral: Negative.        Objective:   Physical Exam  Constitutional: She is oriented to person, place, and time. She appears well-developed and well-nourished. No distress.  HENT:  Head: Normocephalic and atraumatic.  Eyes: Conjunctivae normal are normal. Pupils are equal, round, and reactive to light. No scleral icterus.  Neck: Normal range of  motion. Neck supple. No tracheal deviation present. No thyromegaly present.  Cardiovascular: Normal rate and regular rhythm.   No murmur heard. Pulmonary/Chest: Effort normal. She has no wheezes. She has no rales.  Abdominal: Soft. Bowel sounds are normal. She exhibits no distension. There is no tenderness.  Musculoskeletal: Normal range of motion. She exhibits no edema and no tenderness.  Neurological: She is alert and oriented to person, place, and time. No cranial nerve deficit.  Skin: Skin is warm and dry. No erythema.  Psychiatric: She has a normal mood and affect. Her behavior is normal.          Assessment & Plan:

## 2012-08-26 NOTE — Patient Instructions (Addendum)
Treatment Goals:  Goals (1 Years of Data) as of 08/26/2012          As of Today 08/23/12 08/23/12 08/22/12 08/22/12     Blood Pressure    . Blood Pressure < 140/90  119/85 171/86 150/80 150/56 135/59     Result Component    . HEMOGLOBIN A1C < 7.0          . LDL CALC < 100             Lab Results  Component Value Date   HGBA1C 7.4 08/13/2012    Progress Toward Treatment Goals:  Treatment Goal 08/26/2012  Hemoglobin A1C improved  Blood pressure at goal    Self Care Goals & Plans:  Self Care Goal 08/26/2012  Manage my medications take my medicines as prescribed; bring my medications to every visit; refill my medications on time; follow the sick day instructions if I am sick  Monitor my health keep track of my blood glucose; bring my glucose meter and log to each visit; check my feet daily  Eat healthy foods eat more vegetables; eat fruit for snacks and desserts; eat foods that are low in salt; eat smaller portions  Be physically active take a walk every day; park at the far end of the parking lot    Home Blood Glucose Monitoring 08/26/2012  Check my blood sugar 4 times a day  When to check my blood sugar before meals; at bedtime               Hyponatremia  Hyponatremia is when the salt (sodium) in your blood is low. When salt becomes low, your cells take in extra water and puff up (swell). The puffiness can happen in the whole body. It mostly affects the brain and is very serious.  HOME CARE  Only take medicine as told by your doctor.  Follow any diet instructions you were given. This includes limiting how much fluid you drink.  Keep all doctor visits for tests as told.  Avoid alcohol and drugs. GET HELP RIGHT AWAY IF:  You start to twitch and shake (seize).  You pass out (faint).  You continue to have watery poop (diarrhea) or you throw up (vomit).  You feel sick to your stomach (nauseous).  You are tired (fatigued), have a headache, are confused, or  feel weak.  Your problems that first brought you to the doctor come back.  You have trouble following your diet instructions. MAKE SURE YOU:   Understand these instructions.  Will watch your condition.  Will get help right away if you are not doing well or get worse. Document Released: 05/22/2011 Document Revised: 12/02/2011 Document Reviewed: 05/22/2011 [facility] Patient Information 2013 [place].

## 2012-08-27 NOTE — Assessment & Plan Note (Signed)
Well-controlled on current regimen after d/c HCTZ. No alternative diuretic needed at this time.   BP Readings from Last 3 Encounters:  08/26/12 119/85  08/23/12 171/86  08/13/12 126/75    Sodium  Date Value Range Status  08/26/2012 121* 135 - 145 mEq/L Final     Potassium  Date Value Range Status  08/26/2012 4.1  3.5 - 5.3 mEq/L Final     Creat  Date Value Range Status  08/26/2012 0.62  0.50 - 1.10 mg/dL Final    Assessment:  Blood pressure control: controlled  Progress toward BP goal:  at goal  Comments:   Plan:  Medications:  continue current medications  Educational resources provided: brochure;handout  Self management tools provided:    Other plans:

## 2012-08-27 NOTE — Assessment & Plan Note (Signed)
Pt has a history of hyperthyroidism, which was treated with methimazole resulting in hypothyroidism (TSH ~40). Pt to see Dr. Buddy Duty today for further evaluation/management of the issue and its sequelae (hyponatremia).   Lab Results  Component Value Date   TSH 40.093* 08/22/2012

## 2012-08-27 NOTE — Assessment & Plan Note (Signed)
Na+ = 121 today (resulted after visit). When results were made available, Dr. Buddy Duty was contacted, and pt was actually in the office with him at the time of the call. Dr. 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   - will be addressed at visit with Dr. Buddy Duty

## 2012-08-28 LAB — CULTURE, BLOOD (ROUTINE X 2)
Culture: NO GROWTH
Culture: NO GROWTH

## 2012-09-18 NOTE — Progress Notes (Signed)
Received progress notes from Dr. Delrae Rend @ [place]; forwarded to Dr. Lamonte Sakai.

## 2012-09-21 ENCOUNTER — Other Ambulatory Visit: Payer: Self-pay | Admitting: Internal Medicine

## 2012-10-06 ENCOUNTER — Other Ambulatory Visit: Payer: Self-pay | Admitting: Internal Medicine

## 2012-10-07 ENCOUNTER — Telehealth: Payer: Self-pay | Admitting: Oncology

## 2012-10-07 ENCOUNTER — Other Ambulatory Visit (HOSPITAL_BASED_OUTPATIENT_CLINIC_OR_DEPARTMENT_OTHER): Payer: Medicaid Other | Admitting: Lab

## 2012-10-07 ENCOUNTER — Ambulatory Visit (HOSPITAL_BASED_OUTPATIENT_CLINIC_OR_DEPARTMENT_OTHER): Payer: Medicaid Other | Admitting: Oncology

## 2012-10-07 ENCOUNTER — Encounter: Payer: Self-pay | Admitting: Oncology

## 2012-10-07 VITALS — BP 154/78 | HR 90 | Temp 98.1°F | Resp 20 | Ht 61.0 in | Wt 116.5 lb

## 2012-10-07 DIAGNOSIS — I890 Lymphedema, not elsewhere classified: Secondary | ICD-10-CM

## 2012-10-07 DIAGNOSIS — I10 Essential (primary) hypertension: Secondary | ICD-10-CM

## 2012-10-07 DIAGNOSIS — D649 Anemia, unspecified: Secondary | ICD-10-CM

## 2012-10-07 DIAGNOSIS — C109 Malignant neoplasm of oropharynx, unspecified: Secondary | ICD-10-CM

## 2012-10-07 LAB — COMPREHENSIVE METABOLIC PANEL (CC13)
Alkaline Phosphatase: 205 U/L — ABNORMAL HIGH (ref 40–150)
BUN: 6 mg/dL — ABNORMAL LOW (ref 7.0–26.0)
Creatinine: 0.7 mg/dL (ref 0.6–1.1)
Glucose: 137 mg/dl — ABNORMAL HIGH (ref 70–99)
Sodium: 138 mEq/L (ref 136–145)
Total Bilirubin: 0.2 mg/dL (ref 0.20–1.20)

## 2012-10-07 LAB — CBC WITH DIFFERENTIAL/PLATELET
Basophils Absolute: 0 10*3/uL (ref 0.0–0.1)
EOS%: 0.9 % (ref 0.0–7.0)
HGB: 12.5 g/dL (ref 11.6–15.9)
LYMPH%: 29.6 % (ref 14.0–49.7)
MCH: 23.6 pg — ABNORMAL LOW (ref 25.1–34.0)
MCV: 74.5 fL — ABNORMAL LOW (ref 79.5–101.0)
MONO%: 6.6 % (ref 0.0–14.0)
NEUT%: 62.5 % (ref 38.4–76.8)
Platelets: 283 10*3/uL (ref 145–400)
RDW: 15.2 % — ABNORMAL HIGH (ref 11.2–14.5)

## 2012-10-07 NOTE — Progress Notes (Signed)
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  PAST THERAPY:  Status post transoral robotic resections by da Vinci robot by Dr. Francina Ames on December 22, 2009.  She underwent submandibular gland excision, left neck dissections level 3 and 4 and left neck dissection 2A, 2B and 5.  She underwent adjuvant chemotherapy with weekly cisplatin and daily XRT between 02/23/10 and 04/13/10.  CURREN THERAPY:  watchful observation.  INTERVAL HISTORY: Susan Evans 57 y.o. female returns for regular follow up by herself.  States he has tightness and swelling to her neck recently. Seen by ENT at [facility]. She was given steroids with improvement. Has been referred to PT for lymphedema, but can not go to appts due to transportation. Laryngoscopy was negative for recurrence. She still has mild to moderate xerostomia; however, she is taking foods orally without dysphagia, odynophagia, mucositis.  She does not want medication for xerostomia. She denied neck swelling or palpable enlarged neck node.  She has nasal drainage and nonproductive cough the past week.  She denied fever, SOB, chest pain, DOE.  She has intermittent weight gain/loss.  Her appetite is still normal. Currently followed by Endocrinology for hyperthyroidism. On Tapazole.   Patient denies fever, fatigue, headache, visual changes, confusion, drenching night sweats, palpable lymph node swelling, mucositis, odynophagia, dysphagia, nausea vomiting, jaundice, chest pain, palpitation, gum bleeding, epistaxis, hematemesis, hemoptysis, abdominal pain, abdominal swelling, early satiety, melena, hematochezia, hematuria, skin rash, spontaneous bleeding, joint swelling, joint pain, heat or cold intolerance, bowel bladder incontinence,  back pain, focal motor weakness, paresthesia, depression, suicidal or homocidal ideation, feeling hopelessness.   MEDICAL HISTORY: Past Medical History  Diagnosis Date  . Diabetes mellitus   . Hyperlipidemia   . Hypertension   . GERD (gastroesophageal reflux disease)   . Tobacco abuse   . Iron deficiency anemia   . Menopause   . Ganglion cyst     right wrist  . 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 Thyroid disease     Hyperthyroidism    SURGICAL HISTORY: History reviewed. No pertinent past surgical history.  MEDICATIONS: Current Outpatient Prescriptions  Medication Sig Dispense Refill  . methimazole (TAPAZOLE) 10 MG tablet Take 10 mg by mouth 3 (three) times daily.      Marland Kitchen amitriptyline (ELAVIL) 25 MG tablet Take 25 mg by mouth at bedtime.        Marland Kitchen amLODipine (NORVASC) 5 MG tablet TAKE 2 TABLETS BY MOUTH EVERY DAY  60 tablet  2  . aspirin 81 MG EC tablet Take 81 mg by mouth daily.        . B-D INS SYRINGE 0.5CC/31GX5/16 31G X 5/16" 0.5 ML MISC USE AS INSTRUCTED TO INJECT INSULIN SUBCUTANEOUSLY  100 each  4  . B-D ULTRAFINE III SHORT PEN 31G X 8 MM MISC USE 3 TIMES DAILY WITH NOVOLOG FLEXPEN AS DIRECTED  90 each  7  . benzonatate (TESSALON PERLES) 100 MG capsule Take 1 capsule (100 mg total) by mouth 3 (three) times daily as needed for cough.  30 capsule  0  . guaiFENesin (MUCINEX) 600 MG 12 hr tablet Take 1 tablet (600 mg total) by mouth 2 (two) times daily.  60 tablet  0  . insulin aspart (NOVOLOG FLEXPEN) 100 UNIT/ML injection Inject into  the skin. 5 units with breakfast, 10 units with lunch, and 8 units with dinner       . insulin glargine (LANTUS) 100 UNIT/ML injection Take 30 units subcutaneously in the morning and 20 units in the evening  10 mL  12  . lisinopril (PRINIVIL,ZESTRIL) 40 MG tablet Take 1 tablet (40 mg total) by mouth daily.  30 tablet  5  . meloxicam (MOBIC) 7.5 MG tablet Take 1 tablet (7.5 mg total) by mouth daily as needed for pain.  30 tablet   0  . metoprolol tartrate (LOPRESSOR) 25 MG tablet TAKE 1 TABLET BY MOUTH TWICE A DAY  60 tablet  5  . omeprazole (PRILOSEC) 20 MG capsule TAKE 1 CAPSULE (20 MG TOTAL) BY MOUTH 2 (TWO) TIMES DAILY.  60 capsule  0  . simvastatin (ZOCOR) 20 MG tablet TAKE 1 TABLET (20 MG TOTAL) BY MOUTH DAILY.  30 tablet  5    ALLERGIES:  is allergic to penicillins.  REVIEW OF SYSTEMS:  The rest of the 14-point review of system was negative.   Filed Vitals:   10/07/12 1106  BP: 154/78  Pulse: 90  Temp: 98.1 F (36.7 C)  Resp: 20   Wt Readings from Last 3 Encounters:  10/07/12 116 lb 8 oz (52.844 kg)  08/26/12 124 lb (56.246 kg)  08/21/12 123 lb 0.3 oz (55.8 kg)   ECOG Performance status: 0-1  PHYSICAL EXAMINATION:   General:  Thin-appearing woman, in no acute distress.  Eyes:  no scleral icterus.  ENT:  There were no oropharyngeal lesions.  Neck was without thyromegaly.  Lymphatics:  Negative cervical, supraclavicular or axillary adenopathy.  Respiratory: lungs were clear bilaterally without wheezing or crackles.  Cardiovascular:  Regular rate and rhythm, S1/S2, without murmur, rub or gallop.  There was no pedal edema.  GI:  abdomen was soft, flat, nontender, nondistended, without organomegaly. Muscoloskeletal:  no spinal tenderness of palpation of vertebral spine.  Skin exam was without echymosis, petichae.  Neuro exam was nonfocal.  Patient was able to get on and off exam table without assistance.  Gait was normal.  Patient was alerted and oriented.  Attention was good.   Language was appropriate.  Mood was normal without depression.  Speech was not pressured.  Thought content was not tangential.    LABORATORY/RADIOLOGY DATA:  Lab Results  Component Value Date   WBC 7.4 10/07/2012   HGB 12.5 10/07/2012   HCT 39.5 10/07/2012   PLT 283 10/07/2012   GLUCOSE 137* 10/07/2012   CHOL 137 05/20/2012   TRIG 149 05/20/2012   HDL 42 05/20/2012   LDLCALC 65 05/20/2012   ALT 15 10/07/2012   AST 19 10/07/2012   NA  138 10/07/2012   K 4.1 10/07/2012   CL 100 10/07/2012   CREATININE 0.7 10/07/2012   BUN 6.0* 10/07/2012   CO2 28 10/07/2012   TSH 40.093* 08/22/2012   HGBA1C 7.4 08/13/2012   MICROALBUR 10.79* 04/03/2011     ASSESSMENT AND PLAN:   1. History of pharyngeal squamous cell carcinoma:  Per clinical history, physical exam, lab she has no evidence of disease recurrence.  She still sees Dr. Nicolette Bang twice a year at [facility] for routine ENT followup. I have offered her another referral to the lymphedema clinic. She has declined due to transportation issues. 2. History of smoking.  She no longer smokes at this time.  3. 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  Hemoglobin has normalized today.  Her last colonoscopy with [facility] in 2007 was reportedly negative per patient's report.  She was told that the next surveillance coloscopy is is 2017.  4. Diabetes mellitus type 2.  She is on insulin per PCP. 5. Hypertension. Under good control.   She is on amlodipine, lisinopril, metoprolol per PCP. 6. Hyperlipidemia.  She is on simvastatin per her PCP. 7. Hyperthyroidism. On Tapazole per Endocrinology. TSH is pending today. 8. Follow up:  Lab/RV with me in about 6 months.  She also has follow up with ENT. I have referred her back to [facility] since she has not been seen by them in 1 year.

## 2012-10-07 NOTE — Telephone Encounter (Signed)
gv and pritned appt schedule for pt for Feb and July.... Schedule pt with Dr. Pablo Ledger for Feb 6 @ 11:00pm

## 2012-10-23 ENCOUNTER — Other Ambulatory Visit: Payer: Self-pay | Admitting: Internal Medicine

## 2012-10-28 ENCOUNTER — Encounter: Payer: Self-pay | Admitting: Radiation Oncology

## 2012-10-28 DIAGNOSIS — Z780 Asymptomatic menopausal state: Secondary | ICD-10-CM | POA: Insufficient documentation

## 2012-10-28 DIAGNOSIS — M6740 Ganglion, unspecified site: Secondary | ICD-10-CM | POA: Insufficient documentation

## 2012-10-28 DIAGNOSIS — Z923 Personal history of irradiation: Secondary | ICD-10-CM | POA: Insufficient documentation

## 2012-10-28 DIAGNOSIS — E785 Hyperlipidemia, unspecified: Secondary | ICD-10-CM | POA: Insufficient documentation

## 2012-10-28 DIAGNOSIS — K219 Gastro-esophageal reflux disease without esophagitis: Secondary | ICD-10-CM | POA: Insufficient documentation

## 2012-10-28 DIAGNOSIS — C801 Malignant (primary) neoplasm, unspecified: Secondary | ICD-10-CM | POA: Insufficient documentation

## 2012-10-28 DIAGNOSIS — E079 Disorder of thyroid, unspecified: Secondary | ICD-10-CM | POA: Insufficient documentation

## 2012-10-28 DIAGNOSIS — I10 Essential (primary) hypertension: Secondary | ICD-10-CM | POA: Insufficient documentation

## 2012-10-29 ENCOUNTER — Ambulatory Visit: Payer: Medicaid Other | Admitting: Radiation Oncology

## 2012-10-29 ENCOUNTER — Encounter: Payer: Self-pay | Admitting: Radiation Oncology

## 2012-10-29 ENCOUNTER — Ambulatory Visit
Admission: RE | Admit: 2012-10-29 | Discharge: 2012-10-29 | Disposition: A | Discharge status: Home / Self Care | Payer: Medicaid Other | Source: Ambulatory Visit | Attending: Radiation Oncology | Admitting: Radiation Oncology

## 2012-10-29 ENCOUNTER — Ambulatory Visit: Payer: Medicaid Other

## 2012-10-29 VITALS — BP 157/72 | HR 82 | Temp 98.7°F | Resp 20 | Wt 120.2 lb

## 2012-10-29 DIAGNOSIS — C109 Malignant neoplasm of oropharynx, unspecified: Secondary | ICD-10-CM

## 2012-10-29 NOTE — Progress Notes (Signed)
Department of Radiation Oncology  Phone:  [phone] Fax:        [phone]   Name: TORSHA GILLS   DOB: [birth year]  MRN: [id number]    Date: 10/29/2012  Follow Up Visit Note  Diagnosis: T1 N1 squamous cell carcinoma of the  Interval since last radiation: 2 and half years  Interval History: Donishia presents today for routine followup.  She is feeling well and doing well. She continues to have neck and shoulder pain. She has not been referred to physical therapy due to transportation issues are missing multiple prior appointments. She is still has dry mouth. She talks to fit up in small pieces and is able to swallow she wants. She was seen by ENT last month and scoped with no evidence of tumor recurrence per her report. He had followup with medical oncology last month.  Allergies:  Allergies  Allergen Reactions  . Penicillins     "takes all of her hair out"    Medications:  Current Outpatient Prescriptions  Medication Sig Dispense Refill  . amitriptyline (ELAVIL) 25 MG tablet Take 25 mg by mouth at bedtime.        Marland Kitchen amLODipine (NORVASC) 5 MG tablet TAKE 2 TABLETS BY MOUTH EVERY DAY  60 tablet  2  . aspirin 81 MG EC tablet Take 81 mg by mouth daily.        . B-D INS SYRINGE 0.5CC/31GX5/16 31G X 5/16" 0.5 ML MISC USE AS INSTRUCTED TO INJECT INSULIN SUBCUTANEOUSLY  100 each  4  . B-D ULTRAFINE III SHORT PEN 31G X 8 MM MISC USE 3 TIMES DAILY WITH NOVOLOG FLEXPEN AS DIRECTED  90 each  7  . benzonatate (TESSALON PERLES) 100 MG capsule Take 1 capsule (100 mg total) by mouth 3 (three) times daily as needed for cough.  30 capsule  0  . guaiFENesin (MUCINEX) 600 MG 12 hr tablet Take 1 tablet (600 mg total) by mouth 2 (two) times daily.  60 tablet  0  . insulin aspart (NOVOLOG FLEXPEN) 100 UNIT/ML injection Inject into the skin. 5 units with breakfast, 10 units with lunch, and 8 units with dinner       . insulin glargine (LANTUS) 100 UNIT/ML injection Take 30 units subcutaneously  in the morning and 20 units in the evening  10 mL  12  . lisinopril (PRINIVIL,ZESTRIL) 40 MG tablet Take 1 tablet (40 mg total) by mouth daily.  30 tablet  5  . meloxicam (MOBIC) 7.5 MG tablet Take 1 tablet (7.5 mg total) by mouth daily as needed for pain.  30 tablet  0  . methimazole (TAPAZOLE) 10 MG tablet Take 10 mg by mouth 3 (three) times daily.      . metoprolol tartrate (LOPRESSOR) 25 MG tablet TAKE 1 TABLET BY MOUTH TWICE A DAY  60 tablet  5  . omeprazole (PRILOSEC) 20 MG capsule TAKE 1 CAPSULE (20 MG TOTAL) BY MOUTH 2 (TWO) TIMES DAILY.  60 capsule  5  . simvastatin (ZOCOR) 20 MG tablet TAKE 1 TABLET (20 MG TOTAL) BY MOUTH DAILY.  30 tablet  5    Physical Exam:   weight is 120 lb 3.2 oz (54.522 kg). Her oral temperature is 98.7 F (37.1 C). Her blood pressure is 157/72 and her pulse is 82. Her respiration is 20.  She is pleasant female in no distress. She avoids eye contact. She has some ptosis of her bilateral neck. She has minimal extension of her neck. No palpable  cervical or subclavicular adenopathy. Her oropharynx is benign without evidence of thrush or lesions.  IMPRESSION: Rissie is a 57 y.o. female status post transurethral resection and chemoradiation for an early stage oropharynx cancer with no evidence of disease  PLAN:  Ms. Baldo Daub is doing well. She has no clinical signs of disease. I released her from followup with me will be happy to see her back on a when necessary basis.. I had her social worker meet with her and arrange for transportation through [place] with her Medicaid. We'll continue regular schedule followup with medical oncology and her surgeon.    Thea Silversmith, MD

## 2012-10-29 NOTE — Progress Notes (Signed)
Pt denies pain, fatigue, loss of appetite, states she can eat almost any type foods. She does have dry mouth.

## 2012-11-09 ENCOUNTER — Ambulatory Visit: Payer: Medicaid Other | Attending: Radiation Oncology | Admitting: Physical Therapy

## 2012-11-09 DIAGNOSIS — IMO0001 Reserved for inherently not codable concepts without codable children: Secondary | ICD-10-CM | POA: Insufficient documentation

## 2012-11-09 DIAGNOSIS — M2569 Stiffness of other specified joint, not elsewhere classified: Secondary | ICD-10-CM | POA: Insufficient documentation

## 2012-11-16 ENCOUNTER — Ambulatory Visit: Payer: Medicaid Other | Admitting: *Deleted

## 2012-11-22 ENCOUNTER — Other Ambulatory Visit: Payer: Self-pay | Admitting: Internal Medicine

## 2012-11-23 ENCOUNTER — Encounter: Payer: Medicaid Other | Admitting: *Deleted

## 2012-11-25 ENCOUNTER — Ambulatory Visit: Payer: Medicaid Other

## 2012-11-30 ENCOUNTER — Ambulatory Visit: Payer: Medicaid Other | Attending: Radiation Oncology

## 2012-11-30 DIAGNOSIS — IMO0001 Reserved for inherently not codable concepts without codable children: Secondary | ICD-10-CM | POA: Insufficient documentation

## 2012-11-30 DIAGNOSIS — M2569 Stiffness of other specified joint, not elsewhere classified: Secondary | ICD-10-CM | POA: Insufficient documentation

## 2012-12-02 ENCOUNTER — Ambulatory Visit: Payer: Medicaid Other | Admitting: *Deleted

## 2012-12-06 ENCOUNTER — Other Ambulatory Visit: Payer: Self-pay | Admitting: Internal Medicine

## 2012-12-07 ENCOUNTER — Ambulatory Visit: Payer: Medicaid Other | Admitting: Physical Therapy

## 2012-12-11 NOTE — Telephone Encounter (Signed)
Pt aware.

## 2012-12-18 ENCOUNTER — Other Ambulatory Visit: Payer: Self-pay | Admitting: Internal Medicine

## 2013-01-16 ENCOUNTER — Other Ambulatory Visit: Payer: Self-pay | Admitting: Internal Medicine

## 2013-01-21 ENCOUNTER — Encounter: Payer: Self-pay | Admitting: Internal Medicine

## 2013-01-21 ENCOUNTER — Ambulatory Visit (INDEPENDENT_AMBULATORY_CARE_PROVIDER_SITE_OTHER): Payer: Medicaid Other | Admitting: Internal Medicine

## 2013-01-21 VITALS — BP 135/88 | HR 91 | Temp 97.6°F | Ht 62.0 in | Wt 123.3 lb

## 2013-01-21 DIAGNOSIS — M25512 Pain in left shoulder: Secondary | ICD-10-CM

## 2013-01-21 DIAGNOSIS — E119 Type 2 diabetes mellitus without complications: Secondary | ICD-10-CM

## 2013-01-21 DIAGNOSIS — I10 Essential (primary) hypertension: Secondary | ICD-10-CM

## 2013-01-21 DIAGNOSIS — C140 Malignant neoplasm of pharynx, unspecified: Secondary | ICD-10-CM

## 2013-01-21 DIAGNOSIS — E05 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: Secondary | ICD-10-CM

## 2013-01-21 DIAGNOSIS — M25519 Pain in unspecified shoulder: Secondary | ICD-10-CM

## 2013-01-21 LAB — POCT GLYCOSYLATED HEMOGLOBIN (HGB A1C): Hemoglobin A1C: 8.1

## 2013-01-21 LAB — GLUCOSE, CAPILLARY: Glucose-Capillary: 129 mg/dL — ABNORMAL HIGH (ref 70–99)

## 2013-01-21 NOTE — Progress Notes (Signed)
Internal Medicine Clinic Visit    HPI:  Susan Evans is a 57 y.o. year old female who presents to clinic for followup.  Patient states that overall she has been doing well. She requests that her medications be refilled all in same time so she doesn't need to keep on calling the clinic to get refills.  She also was to discuss her chronic left shoulder pain. She states that she fell 10 years ago and has had chronic left shoulder pain since then. It is unchanged from previous but continues to bother her. She has tried Mobic but that does not seem to help. She states that the pain is worse at night and in the morning. She has good range of motion but pain with certain activities such as lifting her arm up high or lifting heavy objects.  Regarding her diabetes, patient states that her blood sugar gets high "every now and then," and has been up to 280 about twice since last visit. She denies any symptoms of hypoglycemia and has not had any blood sugar values less than 75. Patient continues to take Lantus 30 units in the morning and 20 units at night as well as NovoLog Flex pen with meals. She admits that she has not been following a diabetic diet recently.  Patient has had some trouble with her thyroid recently. She is followed by Dr. Buddy Duty  Patient denies feeling really hot or really cold. She does have occasional constipation but this is chronic, no changes recently. Denies shortness of breath, chest pain.   Past Medical History  Diagnosis Date  . Diabetes mellitus   . Hyperlipidemia   . Hypertension   . GERD (gastroesophageal reflux disease)   . Tobacco abuse     PT HAS QUIT  . Iron deficiency anemia   . Menopause   . Ganglion cyst     right wrist  . 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 Thyroid disease     Hyperthyroidism  . History of radiation therapy 02/27/10 -04/13/10    left base of tongue, bilat neck    No past surgical history on file.   ROS:  A complete  review of systems was otherwise negative, except as noted in the HPI.  Allergies: Penicillins  Medications: Current Outpatient Prescriptions  Medication Sig Dispense Refill  . amitriptyline (ELAVIL) 25 MG tablet Take 25 mg by mouth at bedtime.        Marland Kitchen amLODipine (NORVASC) 5 MG tablet TAKE 2 TABLETS BY MOUTH EVERY DAY  60 tablet  2  . aspirin 81 MG EC tablet Take 81 mg by mouth daily.        . B-D INS SYRINGE 0.5CC/31GX5/16 31G X 5/16" 0.5 ML MISC USE AS INSTRUCTED TO INJECT INSULIN SUBCUTANEOUSLY  100 each  4  . B-D ULTRAFINE III SHORT PEN 31G X 8 MM MISC USE 3 TIMES DAILY WITH NOVOLOG FLEXPEN AS DIRECTED  90 each  7  . B-D ULTRAFINE III SHORT PEN 31G X 8 MM MISC USE 3 TIMES DAILY WITH NOVOLOG FLEXPEN AS DIRECTED  100 each  7  . insulin aspart (NOVOLOG FLEXPEN) 100 UNIT/ML injection Inject into the skin. 5 units with breakfast, 10 units with lunch, and 8 units with dinner       . LANTUS 100 UNIT/ML injection TAKE 30 UNITS SUBCUTANEOUSLY IN THE MORNING AND 20 UNITS IN THE EVENING  10 mL  12  . lisinopril (PRINIVIL,ZESTRIL) 40 MG tablet Take 1 tablet (40  mg total) by mouth daily.  30 tablet  5  . methimazole (TAPAZOLE) 10 MG tablet Take 10 mg by mouth 3 (three) times daily.      . metoprolol tartrate (LOPRESSOR) 25 MG tablet TAKE 1 TABLET BY MOUTH TWICE A DAY  60 tablet  5  . omeprazole (PRILOSEC) 20 MG capsule TAKE 1 CAPSULE (20 MG TOTAL) BY MOUTH 2 (TWO) TIMES DAILY.  60 capsule  5  . simvastatin (ZOCOR) 20 MG tablet TAKE 1 TABLET BY MOUTH EVERY DAY  30 tablet  5   No current facility-administered medications for this visit.    History   Social History  . Marital Status: Single    Spouse Name: N/A    Number of Children: N/A  . Years of Education: N/A   Occupational History  . Not on file.   Social History Main Topics  . Smoking status: Former Smoker -- 0.00 packs/day    Quit date: 02/09/2009  . Smokeless tobacco: Not on file  . Alcohol Use: No  . Drug Use: No  . Sexually  Active: Yes   Other Topics Concern  . Not on file   Social History Narrative  . No narrative on file    family history includes Stroke in her father.  Physical Exam Blood pressure 135/88, pulse 91, temperature 97.6 F (36.4 C), temperature source Oral, height 5\' 2"  (1.575 m), weight 123 lb 4.8 oz (55.929 kg), SpO2 98.00%. General:  No acute distress, alert and oriented x 3, well hydrated African American female HEENT:  PERRL, EOMI, voice is chronically hoarse, no palpable thyroid nodules Cardiovascular:  Regular rate and rhythm, no murmurs, rubs or gallops Respiratory:  Good air movement, mild intermittent upper airway noises, no tachypnea or increased WOB, no stridor Abdomen:  Soft, nondistended, nontender, normoactive bowel sounds Extremities:  Warm and well-perfused, no clubbing, cyanosis, or edema.  Skin: Warm, dry, no rashes Neuro: Blunted affect  Labs: Lab Results  Component Value Date   CREATININE 0.7 10/07/2012   BUN 6.0* 10/07/2012   NA 138 10/07/2012   K 4.1 10/07/2012   CL 100 10/07/2012   CO2 28 10/07/2012   Lab Results  Component Value Date   WBC 7.4 10/07/2012   HGB 12.5 10/07/2012   HCT 39.5 10/07/2012   MCV 74.5* 10/07/2012   PLT 283 10/07/2012      Assessment and Plan:   FOLLOWUP: Gretel Acre will follow back up in our clinic in approximately 3 months, sooner if needed. Diahann Lynd knows to call our clinic in the meantime with any questions.

## 2013-01-21 NOTE — Assessment & Plan Note (Addendum)
Chronic, unchanged. Will give Ultracet to use PRN and refer for physical therapy.

## 2013-01-23 NOTE — Assessment & Plan Note (Signed)
Patient follows regularly with Dr. Lamonte Sakai at [facility] and ENT at [facility] with recent exam in March 2014. No evidence of cancer recurrence on laryngoscopy. She does not have any stridor or respiratory distress.  -Continue to followup as scheduled

## 2013-01-23 NOTE — Assessment & Plan Note (Signed)
Lab Results  Component Value Date   HGBA1C 8.1 01/21/2013   HGBA1C 7.4 08/13/2012   HGBA1C 7.9 05/20/2012     Assessment: Diabetes control: fair control Progress toward A1C goal:   deteriorated Comments: Likely due to dietary indiscretions  Plan: Medications:  continue current medications Home glucose monitoring: Frequency: 3 times a day Timing:   Instruction/counseling given: reminded to bring blood glucose meter & log to each visit, reminded to bring medications to each visit, discussed foot care and discussed diet Educational resources provided: brochure Self management tools provided: home glucose logbook Other plans:   Patient admits to dietary indiscretions. We did discuss following a diabetic diet and limiting high carbohydrates and high fat foods.  -Continue current insulin regimen -Encouraged diabetic diet

## 2013-01-23 NOTE — Assessment & Plan Note (Signed)
Patient with Graves' disease followed by Dr. Buddy Duty with endocrinology. She was initially hyperthyroid, started on a low dose of methimazole and then subsequently went hypothyroid with TSH 30 and free T4 0.47 on 10/26/2012. She will likely need further titration of her methimazole depending on her current labs. Patient is seeing her endocrinologist on Monday.  -Followup with endocrinologist as scheduled

## 2013-01-23 NOTE — Assessment & Plan Note (Signed)
BP Readings from Last 3 Encounters:  01/21/13 135/88  10/29/12 157/72  10/07/12 154/78    Lab Results  Component Value Date   NA 138 10/07/2012   K 4.1 10/07/2012   CREATININE 0.7 10/07/2012    Assessment: Blood pressure control: controlled Progress toward BP goal:  at goal Comments:   Plan: Medications:  continue current medications Educational resources provided: brochure;video Self management tools provided: home blood pressure logbook Other plans:   Patient's blood pressure is ok today. I did encourage her to make sure that she takes her medications every day including before doctor's visits.  Refilled medications

## 2013-01-26 NOTE — Progress Notes (Signed)
Case discussed with Dr. Sissy Hoff  (at time of visit, soon after the resident saw the patient).  We reviewed the resident's history and exam and pertinent patient test results.  I agree with the assessment, diagnosis, and plan of care documented in the resident's note.

## 2013-01-30 ENCOUNTER — Other Ambulatory Visit: Payer: Self-pay | Admitting: Internal Medicine

## 2013-02-01 ENCOUNTER — Other Ambulatory Visit: Payer: Self-pay | Admitting: Internal Medicine

## 2013-02-01 DIAGNOSIS — I10 Essential (primary) hypertension: Secondary | ICD-10-CM

## 2013-02-02 MED ORDER — OMEPRAZOLE 20 MG PO CPDR: 20.0000 mg | DELAYED_RELEASE_CAPSULE | Freq: Two times a day (BID) | ORAL | Status: DC

## 2013-02-02 MED ORDER — AMLODIPINE BESYLATE 10 MG PO TABS: 10.0000 mg | ORAL_TABLET | Freq: Every day | ORAL | Status: DC

## 2013-02-02 MED ORDER — LISINOPRIL 40 MG PO TABS: 40.0000 mg | ORAL_TABLET | Freq: Every day | ORAL | Status: DC

## 2013-03-04 ENCOUNTER — Other Ambulatory Visit: Payer: Self-pay

## 2013-03-04 DIAGNOSIS — Z1231 Encounter for screening mammogram for malignant neoplasm of breast: Secondary | ICD-10-CM

## 2013-03-13 ENCOUNTER — Other Ambulatory Visit: Payer: Self-pay | Admitting: Internal Medicine

## 2013-03-18 ENCOUNTER — Telehealth: Payer: Self-pay | Admitting: *Deleted

## 2013-03-18 ENCOUNTER — Encounter: Payer: Self-pay | Admitting: Licensed Clinical Social Worker

## 2013-03-18 NOTE — Telephone Encounter (Signed)
Pt left note requesting a letter from Dr. Lamonte Sakai stating that pt can "handle her own affairs."   Called pt to clarify request and she says she needs letter from MD saying she can control her own money.  Explained to her that Dr. Lamonte Sakai would not have any way of knowing whether pt is capable of handling her own money or not.  Suggested this needs to come from a Psychiatrist or possibly her PCP who knows her better.  She verbalized understanding.

## 2013-03-18 NOTE — Progress Notes (Unsigned)
Susan Evans presents to the [facility] requesting PCP to complete form from [employer].  Currently, Susan Evans's daughter is her representative payee for financial benefits.  Pt states her daughter was designated as Rep Payee 7-9 years ago when benefits were started because she was not "stable".  Pt related this to her "drinking problem".  Susan Evans states she quit drinking when she became a patient at the [facility] and states she can manage her own finances.  Pt states she and her daughter had a disagreement and pt would like to handle her own finances and pay her own bills.  Pt was last seen by PCP on 01/21/2013.  Pt provided Form [id number] for physician completion.  CSW will forward to the Attending Pool Coverage.

## 2013-04-07 ENCOUNTER — Ambulatory Visit (HOSPITAL_BASED_OUTPATIENT_CLINIC_OR_DEPARTMENT_OTHER): Payer: Medicaid Other | Admitting: Oncology

## 2013-04-07 ENCOUNTER — Telehealth: Payer: Self-pay | Admitting: Oncology

## 2013-04-07 ENCOUNTER — Other Ambulatory Visit (HOSPITAL_BASED_OUTPATIENT_CLINIC_OR_DEPARTMENT_OTHER): Payer: Medicaid Other | Admitting: Lab

## 2013-04-07 VITALS — BP 152/75 | HR 80 | Temp 98.5°F | Resp 18 | Ht 62.0 in | Wt 125.2 lb

## 2013-04-07 DIAGNOSIS — C140 Malignant neoplasm of pharynx, unspecified: Secondary | ICD-10-CM

## 2013-04-07 DIAGNOSIS — C109 Malignant neoplasm of oropharynx, unspecified: Secondary | ICD-10-CM

## 2013-04-07 LAB — CBC WITH DIFFERENTIAL/PLATELET
Basophils Absolute: 0 10*3/uL (ref 0.0–0.1)
HCT: 39 % (ref 34.8–46.6)
HGB: 12.4 g/dL (ref 11.6–15.9)
MONO#: 0.6 10*3/uL (ref 0.1–0.9)
NEUT#: 4.5 10*3/uL (ref 1.5–6.5)
NEUT%: 58.7 % (ref 38.4–76.8)
RDW: 14.6 % — ABNORMAL HIGH (ref 11.2–14.5)
WBC: 7.6 10*3/uL (ref 3.9–10.3)
lymph#: 2.4 10*3/uL (ref 0.9–3.3)

## 2013-04-07 LAB — TSH CHCC: TSH: 0.754 m(IU)/L (ref 0.308–3.960)

## 2013-04-07 LAB — COMPREHENSIVE METABOLIC PANEL (CC13)
ALT: 21 U/L (ref 0–55)
Albumin: 3.4 g/dL — ABNORMAL LOW (ref 3.5–5.0)
BUN: 6.8 mg/dL — ABNORMAL LOW (ref 7.0–26.0)
CO2: 29 mEq/L (ref 22–29)
Calcium: 10 mg/dL (ref 8.4–10.4)
Chloride: 102 mEq/L (ref 98–109)
Creatinine: 0.8 mg/dL (ref 0.6–1.1)
Potassium: 4.1 mEq/L (ref 3.5–5.1)

## 2013-04-07 NOTE — Telephone Encounter (Signed)
gv and printed appt sched and avs for pt  °

## 2013-04-08 NOTE — Progress Notes (Signed)
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  PAST THERAPY:  Status post transoral robotic resections by da Vinci robot by Dr. Francina Ames on December 22, 2009.  She underwent submandibular gland excision, left neck dissections level 3 and 4 and left neck dissection 2A, 2B and 5.  She underwent adjuvant chemotherapy with weekly cisplatin and daily XRT between 02/23/10 and 04/13/10.  CURREN THERAPY:  watchful observation.  INTERVAL HISTORY: Susan Evans 57 y.o. female returns for regular follow up by herself.  She reports doing well.  Xerostomia has significantly improved. She denies any other problems.  Patient denies fever, anorexia, weight loss, fatigue, headache, visual changes, confusion, drenching night sweats, palpable lymph node swelling, mucositis, odynophagia, dysphagia, nausea vomiting, jaundice, chest pain, palpitation, shortness of breath, dyspnea on exertion, productive cough, gum bleeding, epistaxis, hematemesis, hemoptysis, abdominal pain, abdominal swelling, early satiety, melena, hematochezia, hematuria, skin rash, spontaneous bleeding, joint swelling, joint pain, heat or cold intolerance, bowel bladder incontinence, back pain, focal motor weakness, paresthesia, depression.    MEDICAL HISTORY: Past Medical History  Diagnosis Date  . Diabetes mellitus   . Hyperlipidemia   . Hypertension   . GERD (gastroesophageal reflux disease)   . Tobacco abuse     PT HAS QUIT  . Iron deficiency anemia   . Menopause   . Ganglion cyst     right wrist  . 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 Thyroid disease     Hyperthyroidism  . History of radiation therapy 02/27/10 -04/13/10    left base of tongue, bilat neck    SURGICAL  HISTORY: No past surgical history on file.  MEDICATIONS: Current Outpatient Prescriptions  Medication Sig Dispense Refill  . ACCU-CHEK AVIVA PLUS test strip USE TO CHECK BLOOD SUGAR 4 TIMES A DAY  150 each  11  . amLODipine (NORVASC) 10 MG tablet Take 1 tablet (10 mg total) by mouth daily.  30 tablet  11  . aspirin 81 MG EC tablet Take 81 mg by mouth daily.        . B-D INS SYRINGE 0.5CC/31GX5/16 31G X 5/16" 0.5 ML MISC USE AS INSTRUCTED TO INJECT INSULIN SUBCUTANEOUSLY  100 each  4  . B-D ULTRAFINE III SHORT PEN 31G X 8 MM MISC USE 3 TIMES DAILY WITH NOVOLOG FLEXPEN AS DIRECTED  90 each  7  . B-D ULTRAFINE III SHORT PEN 31G X 8 MM MISC USE 3 TIMES DAILY WITH NOVOLOG FLEXPEN AS DIRECTED  100 each  7  . B-D ULTRAFINE III SHORT PEN 31G X 8 MM MISC USE 3 TIMES DAILY WITH NOVOLOG FLEXPEN  90 each  11  . insulin aspart (NOVOLOG FLEXPEN) 100 UNIT/ML injection Inject into the skin. 5 units with breakfast, 10 units with lunch, and 8 units with dinner       . LANTUS 100 UNIT/ML injection TAKE 30 UNITS SUBCUTANEOUSLY IN THE MORNING AND 20 UNITS IN THE EVENING  10 mL  12  . lisinopril (PRINIVIL,ZESTRIL) 40 MG tablet Take 1 tablet (40 mg total) by mouth daily.  30 tablet  11  . methimazole (TAPAZOLE) 10 MG tablet Take 10 mg by mouth 3 (three) times daily.      . metoprolol tartrate (LOPRESSOR) 25 MG tablet TAKE 1 TABLET  BY MOUTH TWICE A DAY  60 tablet  11  . omeprazole (PRILOSEC) 20 MG capsule Take 1 capsule (20 mg total) by mouth 2 (two) times daily.  60 capsule  11  . simvastatin (ZOCOR) 20 MG tablet TAKE 1 TABLET BY MOUTH EVERY DAY  30 tablet  5   No current facility-administered medications for this visit.    ALLERGIES:  is allergic to penicillins.  REVIEW OF SYSTEMS:  The rest of the 14-point review of system was negative.   Filed Vitals:   04/07/13 1147  BP: 152/75  Pulse: 80  Temp: 98.5 F (36.9 C)  Resp: 18   Wt Readings from Last 3 Encounters:  04/07/13 125 lb 3.2 oz (56.79 kg)    01/21/13 123 lb 4.8 oz (55.929 kg)  10/29/12 120 lb 3.2 oz (54.522 kg)   ECOG Performance status: 0-1  PHYSICAL EXAMINATION:   General:  Thin-appearing woman, in no acute distress.  Eyes:  no scleral icterus.  ENT:  There were no oropharyngeal lesions.  Neck was without thyromegaly.  Lymphatics:  Negative cervical, supraclavicular or axillary adenopathy.  Respiratory: lungs were clear bilaterally without wheezing or crackles.  Cardiovascular:  Regular rate and rhythm, S1/S2, without murmur, rub or gallop.  There was no pedal edema.  GI:  abdomen was soft, flat, nontender, nondistended, without organomegaly.  Rectal exam; normal rectal tone; brown stool, Guiac hem negative.  Muscoloskeletal:  no spinal tenderness of palpation of vertebral spine.  Skin exam was without echymosis, petichae.  Neuro exam was nonfocal.  Patient was able to get on and off exam table without assistance.  Gait was normal.  Patient was alerted and oriented.  Attention was good.   Language was appropriate.  Mood was normal without depression.  Speech was not pressured.  Thought content was not tangential.    LABORATORY/RADIOLOGY DATA:  Lab Results  Component Value Date   WBC 7.6 04/07/2013   HGB 12.4 04/07/2013   HCT 39.0 04/07/2013   PLT 198 04/07/2013   GLUCOSE 99 04/07/2013   CHOL 137 05/20/2012   TRIG 149 05/20/2012   HDL 42 05/20/2012   LDLCALC 65 05/20/2012   ALT 21 04/07/2013   AST 22 04/07/2013   NA 140 04/07/2013   K 4.1 04/07/2013   CL 100 10/07/2012   CREATININE 0.8 04/07/2013   BUN 6.8* 04/07/2013   CO2 29 04/07/2013   TSH 0.754 04/07/2013   HGBA1C 8.1 01/21/2013   MICROALBUR 10.79* 04/03/2011     ASSESSMENT AND PLAN:   1. History of pharyngeal squamous cell carcinoma:  Continue to be in remission. She has follow with ENT with medical oncology. She continued to refrain from smoking, chewing tobacco, Ricky alcohol.  2. Diabetes mellitus type 2.  She is on insulin per PCP. 3. Hypertension: She is on amlodipine,  HCTZ, lisinopril, metoprolol per PCP. 4. Hyperlipidemia.  She is on simvastatin per her PCP. 5. Follow up:  In about one year.  I informed the patient that I am leaving the practice. The cancer Center will arrange for her to see a new provider when she returns.  The length of time of the face-to-face encounter was 10 minutes. More than 50% of time was spent counseling and coordination of care.     Yasira Engelson T. Lamonte Sakai, M.D.

## 2013-04-09 ENCOUNTER — Ambulatory Visit: Payer: Medicaid Other

## 2013-04-12 ENCOUNTER — Ambulatory Visit (INDEPENDENT_AMBULATORY_CARE_PROVIDER_SITE_OTHER): Payer: Medicaid Other | Admitting: Internal Medicine

## 2013-04-12 ENCOUNTER — Encounter: Payer: Self-pay | Admitting: Internal Medicine

## 2013-04-12 VITALS — BP 136/85 | HR 84 | Temp 97.5°F | Wt 126.9 lb

## 2013-04-12 DIAGNOSIS — Z780 Asymptomatic menopausal state: Secondary | ICD-10-CM

## 2013-04-12 DIAGNOSIS — N6452 Nipple discharge: Secondary | ICD-10-CM

## 2013-04-12 DIAGNOSIS — E05 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: Secondary | ICD-10-CM

## 2013-04-12 DIAGNOSIS — E785 Hyperlipidemia, unspecified: Secondary | ICD-10-CM

## 2013-04-12 DIAGNOSIS — I10 Essential (primary) hypertension: Secondary | ICD-10-CM

## 2013-04-12 DIAGNOSIS — N6459 Other signs and symptoms in breast: Secondary | ICD-10-CM

## 2013-04-12 DIAGNOSIS — E119 Type 2 diabetes mellitus without complications: Secondary | ICD-10-CM

## 2013-04-12 DIAGNOSIS — Z0000 Encounter for general adult medical examination without abnormal findings: Secondary | ICD-10-CM | POA: Insufficient documentation

## 2013-04-12 DIAGNOSIS — C140 Malignant neoplasm of pharynx, unspecified: Secondary | ICD-10-CM

## 2013-04-12 LAB — POCT GLYCOSYLATED HEMOGLOBIN (HGB A1C): Hemoglobin A1C: 7.7

## 2013-04-12 LAB — GLUCOSE, CAPILLARY: Glucose-Capillary: 115 mg/dL — ABNORMAL HIGH (ref 70–99)

## 2013-04-12 NOTE — Assessment & Plan Note (Addendum)
She has follow up with Dr. Suzan Garibaldi, ENT, at [facility] next month.

## 2013-04-12 NOTE — Patient Instructions (Addendum)
General Instructions: -Bring your glucose meter to your next visit so we can download it.  -Please sign a release of information form with Dr. Suzan Garibaldi and Dr. Buddy Duty so we can have their office notes and test results.  -Come back and see [place] in one month for diabetes and blood pressure follow up.  -We will mail your form to your house.  -We will call you with your appointment for your Dexa bone scan.  -It was great to meet you today!    Treatment Goals:  Goals (1 Years of Data) as of 04/12/13         As of Today 04/07/13 01/21/13 10/29/12 10/07/12     Blood Pressure    . Blood Pressure < 140/90  136/85 152/75 135/88 157/72 154/78     Result Component    . HEMOGLOBIN A1C < 7.0  7.7  8.1      . LDL CALC < 100            Progress Toward Treatment Goals:  Treatment Goal 04/12/2013  Hemoglobin A1C improved  Blood pressure improved    Self Care Goals & Plans:  Self Care Goal 04/12/2013  Manage my medications take my medicines as prescribed; bring my medications to every visit  Monitor my health -  Eat healthy foods -  Be physically active -    Home Blood Glucose Monitoring 04/12/2013  Check my blood sugar 3 times a day  When to check my blood sugar before meals     Care Management & Community Referrals:  Referral 04/12/2013  Referrals made for care management support none needed  Referrals made to community resources none

## 2013-04-12 NOTE — Assessment & Plan Note (Addendum)
-  Scheduled Dexa Scan for September  *Social Security forms in regards to payee to be filled out and mailed to the patient. CSW aware of case, appreciate her assistance.

## 2013-04-12 NOTE — Assessment & Plan Note (Signed)
Lab Results  Component Value Date   HGBA1C 7.7 04/12/2013   HGBA1C 8.1 01/21/2013   HGBA1C 7.4 08/13/2012     Assessment: Diabetes control: fair control Progress toward A1C goal:  improved Comments: one episode of hypoglycemia with CBG of 79, tremors, she had skipped a meal, no recurrent hypoglycemia since. No CBG meter brought to this visit.   Plan: Medications:  continue current medications. Lantus 25 units in the AM, Novolog 5 units with breakfast and lunch and Novolog 8 units with dinner.  Home glucose monitoring: Frequency: 3 times a day Timing: before meals Instruction/counseling given: reminded to bring blood glucose meter & log to each visit and discussed foot care Educational resources provided: brochure Self management tools provided:  None Other plans: Pt encouraged to bring CBG meter to next visit.

## 2013-04-12 NOTE — Progress Notes (Signed)
  Subjective:    Patient ID: Gretel Acre, female    DOB: 01/02/1956, 57 y.o.   MRN: [id number]  HPI Ms. Pehlke is a pleasant 57 year old woman with PMH of DM2, HTN, Graves disease, hx of squamous cell carcinoma of pharynx s/p resection and chemoradiation in 2011, who presents for follow up visit and reevaluation of payee requirement. Ms. Charette states that she needed a payee for her SS checks 3-4 years ago when she was sick but feels that her health has much improved and is now able to manage her own finances.      Review of Systems  Constitutional: Negative for fever, chills, appetite change and fatigue.  HENT:       Chronic hoarseness   Respiratory: Positive for cough. Negative for shortness of breath.        Occasional cough  Cardiovascular: Negative for chest pain and leg swelling.  Gastrointestinal: Negative for abdominal pain.  Genitourinary: Negative for dysuria.  Musculoskeletal: Negative for back pain.  Neurological: Negative for dizziness and weakness.  Psychiatric/Behavioral: Negative for behavioral problems, confusion and agitation.       Objective:   Physical Exam  Nursing note and vitals reviewed. Constitutional: She is oriented to person, place, and time. She appears well-developed and well-nourished. No distress.  HENT:  Head: Atraumatic.  Upper airway stridor (reported as chronic by the pt)  Eyes: Conjunctivae are normal. No scleral icterus.  Neck: Neck supple. No thyromegaly present.  Cardiovascular: Normal rate and regular rhythm.  Exam reveals no gallop and no friction rub.   Murmur heard. 2/6 Right upper sternal border flow murmur   Pulmonary/Chest: Effort normal and breath sounds normal. No respiratory distress. She has no wheezes. She has no rales.  Abdominal: Soft. Bowel sounds are normal.  Musculoskeletal: She exhibits no edema and no tenderness.  Neurological: She is alert and oriented to person, place, and time.  Skin: Skin is warm and dry.  She is not diaphoretic.  Psychiatric: She has a normal mood and affect. Her behavior is normal.          Assessment & Plan:

## 2013-04-12 NOTE — Assessment & Plan Note (Signed)
She has mammogram scheduled for 8/1.

## 2013-04-12 NOTE — Assessment & Plan Note (Signed)
BP Readings from Last 3 Encounters:  04/12/13 136/85  04/07/13 152/75  01/21/13 135/88    Lab Results  Component Value Date   NA 140 04/07/2013   K 4.1 04/07/2013   CREATININE 0.8 04/07/2013    Assessment: Blood pressure control: controlled Progress toward BP goal:  improved Comments: Pt is compliant to Norvasc 10mg  daily, lisinopril 40mg  daily, and Lopressor 25mg  daily  Plan: Medications:  continue current medications Educational resources provided: brochure Self management tools provided: home blood pressure logbook Other plans: NA

## 2013-04-12 NOTE — Assessment & Plan Note (Addendum)
She is on statin tx, with no muscle cramps reported. Will check LDL during next visit.

## 2013-04-12 NOTE — Assessment & Plan Note (Signed)
She has follow up appointment with Dr. Buddy Duty next month. Pt to sign release of information form so notes from that visit may be faxed to [place].

## 2013-04-13 NOTE — Progress Notes (Signed)
Case discussed with Dr. Kennerly at the time of the visit.  We reviewed the resident's history and exam and pertinent patient test results.  I agree with the assessment, diagnosis, and plan of care documented in the resident's note. 

## 2013-04-26 ENCOUNTER — Ambulatory Visit: Payer: Medicaid Other

## 2013-05-11 ENCOUNTER — Ambulatory Visit
Admission: RE | Admit: 2013-05-11 | Discharge: 2013-05-11 | Disposition: A | Discharge status: Home / Self Care | Payer: Medicaid Other | Source: Ambulatory Visit

## 2013-05-11 DIAGNOSIS — Z1231 Encounter for screening mammogram for malignant neoplasm of breast: Secondary | ICD-10-CM

## 2013-05-20 ENCOUNTER — Ambulatory Visit: Payer: Medicaid Other | Admitting: Internal Medicine

## 2013-05-20 ENCOUNTER — Encounter: Payer: Self-pay | Admitting: Internal Medicine

## 2013-06-09 ENCOUNTER — Ambulatory Visit (HOSPITAL_COMMUNITY): Payer: Medicaid Other

## 2013-06-10 ENCOUNTER — Ambulatory Visit (HOSPITAL_COMMUNITY)
Admission: RE | Admit: 2013-06-10 | Discharge: 2013-06-10 | Disposition: A | Discharge status: Home / Self Care | Payer: Medicaid Other | Source: Ambulatory Visit | Attending: Internal Medicine | Admitting: Internal Medicine

## 2013-06-10 DIAGNOSIS — Z780 Asymptomatic menopausal state: Secondary | ICD-10-CM

## 2013-06-10 DIAGNOSIS — Z13820 Encounter for screening for osteoporosis: Secondary | ICD-10-CM | POA: Insufficient documentation

## 2013-06-28 ENCOUNTER — Ambulatory Visit (INDEPENDENT_AMBULATORY_CARE_PROVIDER_SITE_OTHER): Payer: Medicaid Other | Admitting: Internal Medicine

## 2013-06-28 ENCOUNTER — Encounter: Payer: Self-pay | Admitting: Internal Medicine

## 2013-06-28 VITALS — BP 178/89 | HR 85 | Temp 97.9°F | Ht 62.0 in | Wt 131.2 lb

## 2013-06-28 DIAGNOSIS — E05 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: Secondary | ICD-10-CM

## 2013-06-28 DIAGNOSIS — R809 Proteinuria, unspecified: Secondary | ICD-10-CM

## 2013-06-28 DIAGNOSIS — I10 Essential (primary) hypertension: Secondary | ICD-10-CM

## 2013-06-28 DIAGNOSIS — Z0000 Encounter for general adult medical examination without abnormal findings: Secondary | ICD-10-CM

## 2013-06-28 DIAGNOSIS — E785 Hyperlipidemia, unspecified: Secondary | ICD-10-CM

## 2013-06-28 DIAGNOSIS — M810 Age-related osteoporosis without current pathological fracture: Secondary | ICD-10-CM

## 2013-06-28 DIAGNOSIS — E119 Type 2 diabetes mellitus without complications: Secondary | ICD-10-CM

## 2013-06-28 LAB — LIPID PANEL
HDL: 37 mg/dL — ABNORMAL LOW (ref >39)
Total CHOL/HDL Ratio: 4.6 Ratio
Triglycerides: 278 mg/dL — ABNORMAL HIGH (ref <150)

## 2013-06-28 MED ORDER — CALCIUM CARBONATE-VITAMIN D 500-200 MG-UNIT PO TABS: 1.0000 | ORAL_TABLET | Freq: Two times a day (BID) | ORAL | Status: DC

## 2013-06-28 MED ORDER — ACCU-CHEK AVIVA PLUS W/DEVICE KIT: 1.0000 | PACK | Freq: Four times a day (QID) | Status: DC

## 2013-06-28 MED ORDER — INSULIN ASPART 100 UNIT/ML ~~LOC~~ SOLN: SUBCUTANEOUS | Status: DC

## 2013-06-28 NOTE — Patient Instructions (Addendum)
General Instructions: -Please bring your medicine bottles and your blood glucose meter with you during your next visit.  -Start taking Oscal, Calcium and Vitamin D supplements.  -Make an appointment to see Butch Penny Plyler to talk about your diet and diabetes.  -Continue taking your blood pressure medications and avoid eating salty foods.  -Follow up with [place] in 3-4 weeks.   Treatment Goals:  Goals (1 Years of Data) as of 06/28/13         As of Today 04/12/13 04/07/13 01/21/13 10/29/12     Blood Pressure    . Blood Pressure < 140/90  178/89 136/85 152/75 135/88 157/72     Result Component    . HEMOGLOBIN A1C < 7.0   7.7  8.1     . LDL CALC < 100            Progress Toward Treatment Goals:  Treatment Goal 06/28/2013  Hemoglobin A1C unable to assess  Blood pressure deteriorated    Self Care Goals & Plans:  Self Care Goal 06/28/2013  Manage my medications take my medicines as prescribed; refill my medications on time  Monitor my health keep track of my blood glucose; bring my glucose meter and log to each visit; check my feet daily  Eat healthy foods eat foods that are low in salt; eat baked foods instead of fried foods  Be physically active park at the far end of the parking lot; find an activity I enjoy    Home Blood Glucose Monitoring 06/28/2013  Check my blood sugar 3 times a day  When to check my blood sugar before meals     Care Management & Community Referrals:  Referral 06/28/2013  Referrals made for care management support none needed  Referrals made to community resources none

## 2013-06-29 DIAGNOSIS — M810 Age-related osteoporosis without current pathological fracture: Secondary | ICD-10-CM | POA: Insufficient documentation

## 2013-06-29 LAB — MICROALBUMIN / CREATININE URINE RATIO
Creatinine, Urine: 21.2 mg/dL
Microalb Creat Ratio: 2698.6 mg/g — ABNORMAL HIGH (ref 0.0–30.0)

## 2013-06-29 NOTE — Assessment & Plan Note (Signed)
She does not consume enough calcium or vitamin D through her diet every day.  She barely meets criteria for osteoporosis. She is capable of swallowing smaller pills, including the 33mm tablet of Boniva 150mg  once per month dose.  -Will start Oscal tx for now with 1000mg  Calcium and 400units of Vitamin D per day for now.  -Will discuss Boniva 150mg  once per month tx during her next visit.

## 2013-06-29 NOTE — Assessment & Plan Note (Signed)
Urine microalbumin/Cr is much higher than in 2012. She is on lisinopril 40mg  daily, blood pressure control and glycemic control will be essential to preventing the progression of her proteinuria.  Will order urinalysis during her next visit and further counsel her on the importance of blood pressure control and diabetes control.

## 2013-06-29 NOTE — Progress Notes (Signed)
  Subjective:    Patient ID: Susan Evans, female    DOB: 01-Feb-1956, 57 y.o.   MRN: [id number]  Diabetes Pertinent negatives for hypoglycemia include no dizziness. Pertinent negatives for diabetes include no chest pain, no fatigue and no weakness.   Susan Evans is a 57 year old woman with PMH of HTN, DM2, osteoporosis, and hx of squamous cell carcinoma of pharynx s/p resection and radiation who presents for follow up visit. She requests refill for her Novolog pen and a prescription for a new meter and she accidentally dropped her CBG meter in water.    Review of Systems  Constitutional: Negative for fever, chills, diaphoresis, activity change, appetite change, fatigue and unexpected weight change.  HENT: Negative for trouble swallowing, neck pain and voice change.        Chronic hoarseness   Respiratory: Negative for cough and shortness of breath.   Cardiovascular: Negative for chest pain, palpitations and leg swelling.  Gastrointestinal: Negative for abdominal pain.  Genitourinary: Negative for dysuria.  Neurological: Negative for dizziness, syncope, weakness and light-headedness.  Hematological: Negative for adenopathy.  Psychiatric/Behavioral: Negative for behavioral problems and agitation.       Objective:   Physical Exam  Nursing note and vitals reviewed. Constitutional: She is oriented to person, place, and time. She appears well-nourished. No distress.  HENT:  hoarseness   Eyes: Conjunctivae are normal. No scleral icterus.  Cardiovascular: Normal rate and regular rhythm.   Pulmonary/Chest: Effort normal. No respiratory distress.  Musculoskeletal: She exhibits no edema and no tenderness.  Neurological: She is alert and oriented to person, place, and time. Coordination normal.  Skin: Skin is warm and dry. She is not diaphoretic.  Psychiatric: She has a normal mood and affect. Her behavior is normal.          Assessment & Plan:

## 2013-06-29 NOTE — Assessment & Plan Note (Signed)
BP Readings from Last 3 Encounters:  06/28/13 178/89  04/12/13 136/85  04/07/13 152/75    Lab Results  Component Value Date   NA 140 04/07/2013   K 4.1 04/07/2013   CREATININE 0.8 04/07/2013    Assessment: Blood pressure control: moderately elevated Progress toward BP goal:  deteriorated Comments: Her BP was elevated during this visit but she had not taken her BP medications.   Plan: Medications:  continue current medications. Norvasc 10mg  daily, Lisinopril 40mg , and metoprolol 25mg  daily.  Educational resources provided: brochure Self management tools provided: home blood pressure logbook Other plans: She will follow up in 3-5 weeks for BP recheck.

## 2013-06-29 NOTE — Assessment & Plan Note (Signed)
She received the flu vaccine during this visit.

## 2013-06-29 NOTE — Assessment & Plan Note (Signed)
She is followed by Dr. Buddy Duty in Endocrinology with labs drawn last week and follow up appointment next week. She will request that the visit note be faxed to the [facility].

## 2013-06-29 NOTE — Assessment & Plan Note (Addendum)
Lab Results  Component Value Date   HGBA1C 8.6 06/28/2013   HGBA1C 7.7 04/12/2013   HGBA1C 8.1 01/21/2013     Assessment: Diabetes control: fair control Progress toward A1C goal:  unable to assess Comments: She is on Novolog pen 8 units with breakfast and 5 units at night. She has not been using Novolog 10units with lunch. Her CBG meter was accidentally dropped in water last week and she has been using her fried's meter occasionally to check her CBG which has been in her usual range.   Plan: Medications:  continue current medications. Refilled Novolog pen, gave Rx for new CBG meter (she had strips).  Home glucose monitoring: Frequency: 3 times a day Timing: before meals Instruction/counseling given: reminded to bring blood glucose meter & log to each visit, reminded to bring medications to each visit and discussed diet Educational resources provided: brochure Self management tools provided: home glucose logbook Other plans: Patient to follow up next month. Her Hg A1C during this visit indicates that her DM2 is not as well controlled as before. She agrees to see Debera Lat, our diabetes educator before her follow up visit.              Checked lipid profile which returned in LDL <100, and urine microalbumin/Cr which returned a high ratio.

## 2013-07-05 NOTE — Progress Notes (Signed)
Case discussed with Dr. Hayes Ludwig soon after the resident saw the patient.  We reviewed the resident's history and exam and pertinent patient test results.  I agree with the assessment, diagnosis and plan of care documented in the resident's note.  Her blood pressure control has been intermittently at target.  If it remains elevated at the follow-up visit we will recommend escalating pharmacologic antihypertensive therapy.

## 2013-07-19 ENCOUNTER — Encounter: Payer: Medicaid Other | Admitting: Dietician

## 2013-07-19 ENCOUNTER — Encounter: Payer: Medicaid Other | Admitting: Internal Medicine

## 2013-07-21 ENCOUNTER — Telehealth: Payer: Self-pay | Admitting: Dietician

## 2013-07-21 NOTE — Telephone Encounter (Signed)
REQUEST REPORT FROM DR. HECKER'S OFFICE

## 2013-07-23 ENCOUNTER — Encounter: Payer: Self-pay | Admitting: Dietician

## 2013-07-27 NOTE — Telephone Encounter (Signed)
Per Dr. Payton Emerald office, last appointment was 06-17-2012 (we have record of this visit) , and they do not have phone number to contact patient for an appointment.

## 2013-08-03 ENCOUNTER — Other Ambulatory Visit: Payer: Self-pay | Admitting: *Deleted

## 2013-08-04 MED ORDER — SIMVASTATIN 20 MG PO TABS: 20.0000 mg | ORAL_TABLET | Freq: Every day | ORAL | Status: DC

## 2013-08-30 ENCOUNTER — Encounter: Payer: Medicaid Other | Admitting: Dietician

## 2013-08-30 ENCOUNTER — Encounter: Payer: Medicaid Other | Admitting: Internal Medicine

## 2013-08-31 ENCOUNTER — Other Ambulatory Visit: Payer: Self-pay | Admitting: *Deleted

## 2013-09-02 MED ORDER — "INSULIN SYRINGE-NEEDLE U-100 31G X 5/16"" 0.5 ML MISC": Status: DC

## 2013-09-06 ENCOUNTER — Encounter: Payer: Medicaid Other | Admitting: Internal Medicine

## 2013-09-06 ENCOUNTER — Encounter: Payer: Medicaid Other | Admitting: Dietician

## 2013-10-18 ENCOUNTER — Other Ambulatory Visit: Payer: Self-pay | Admitting: Internal Medicine

## 2013-10-22 ENCOUNTER — Other Ambulatory Visit: Payer: Self-pay | Admitting: Internal Medicine

## 2013-10-22 NOTE — Telephone Encounter (Signed)
Pt called and stated she needs her insulin before the w/e. Thanks

## 2013-11-15 ENCOUNTER — Encounter: Payer: Medicaid Other | Admitting: Dietician

## 2013-11-15 ENCOUNTER — Encounter: Payer: Medicaid Other | Admitting: Internal Medicine

## 2013-11-15 ENCOUNTER — Ambulatory Visit (INDEPENDENT_AMBULATORY_CARE_PROVIDER_SITE_OTHER): Payer: Medicaid Other | Admitting: Internal Medicine

## 2013-11-15 ENCOUNTER — Encounter: Payer: Self-pay | Admitting: Internal Medicine

## 2013-11-15 VITALS — BP 168/90 | HR 91 | Temp 98.0°F | Wt 128.2 lb

## 2013-11-15 DIAGNOSIS — N6452 Nipple discharge: Secondary | ICD-10-CM

## 2013-11-15 DIAGNOSIS — E8809 Other disorders of plasma-protein metabolism, not elsewhere classified: Secondary | ICD-10-CM

## 2013-11-15 DIAGNOSIS — M810 Age-related osteoporosis without current pathological fracture: Secondary | ICD-10-CM

## 2013-11-15 DIAGNOSIS — N6459 Other signs and symptoms in breast: Secondary | ICD-10-CM

## 2013-11-15 DIAGNOSIS — I10 Essential (primary) hypertension: Secondary | ICD-10-CM

## 2013-11-15 DIAGNOSIS — C140 Malignant neoplasm of pharynx, unspecified: Secondary | ICD-10-CM

## 2013-11-15 DIAGNOSIS — R809 Proteinuria, unspecified: Secondary | ICD-10-CM

## 2013-11-15 DIAGNOSIS — E119 Type 2 diabetes mellitus without complications: Secondary | ICD-10-CM

## 2013-11-15 LAB — POCT GLYCOSYLATED HEMOGLOBIN (HGB A1C): HEMOGLOBIN A1C: 8.3

## 2013-11-15 LAB — GLUCOSE, CAPILLARY: Glucose-Capillary: 208 mg/dL — ABNORMAL HIGH (ref 70–99)

## 2013-11-15 MED ORDER — METOPROLOL TARTRATE 50 MG PO TABS: 50.0000 mg | ORAL_TABLET | Freq: Two times a day (BID) | ORAL | Status: DC

## 2013-11-15 NOTE — Patient Instructions (Signed)
General Instructions: -You diabetes is a little better today. Continue eating a healthy diet.  -You may increase your intake of calcium in your foods. Please see the list of calcium rich foods below.  -Start exercising at the [facility] with your sister as we discussed to improve your bone health. Low impact exercise at first will be good for you to prevent injuries. -Follow up with your thyroid doctor next week.  --Make your appointment for your eye exam.  -Start taking metoprolol 50mg  twice per day and follow up with [place] in 1-2 weeks for blood pressure recheck.  -It was great seeing you again today!   Some of the top calcium-rich foods are: 1. Cheese 2. Yogurt 3. Milk 4. Sardines 5. Dark leafy greens like spinach, kale, turnips, and collard greens 6. Fortified cereals such as Total, Raisin Bran, Corn Flakes (They have a lot of calcium in one serving.) 7. Fortified orange juice 8. Soybeans 9. Fortified soymilk (Not all soymilk is a good source of calcium, so it's best to check the label.) 10. And Calcium Enriched breads, grains, and waffles     Treatment Goals:  Goals (1 Years of Data) as of 11/15/13         As of Today 06/28/13 04/12/13 04/07/13 01/21/13     Blood Pressure    . Blood Pressure < 140/90  168/90 178/89 136/85 152/75 135/88     Result Component    . HEMOGLOBIN A1C < 7.0  8.3 8.6 7.7  8.1    . LDL CALC < 100   76         Progress Toward Treatment Goals:  Treatment Goal 11/15/2013  Hemoglobin A1C improved  Blood pressure improved    Self Care Goals & Plans:  Self Care Goal 11/15/2013  Manage my medications take my medicines as prescribed; refill my medications on time  Monitor my health keep track of my blood glucose; bring my glucose meter and log to each visit; check my feet daily  Eat healthy foods drink diet soda or water instead of juice or soda; eat foods that are low in salt; eat baked foods instead of fried foods  Be physically active find an activity I  enjoy; park at the far end of the parking lot  Meeting treatment goals maintain the current self-care plan    Home Blood Glucose Monitoring 11/15/2013  Check my blood sugar 3 times a day  When to check my blood sugar before meals; at bedtime     Care Management & Community Referrals:  Referral 11/15/2013  Referrals made for care management support none needed  Referrals made to community resources -

## 2013-11-15 NOTE — Addendum Note (Signed)
Addended by: Marcelino Duster on: 11/15/2013 02:19 PM   Modules accepted: Orders

## 2013-11-16 LAB — URINALYSIS, ROUTINE W REFLEX MICROSCOPIC
Bilirubin Urine: NEGATIVE
Glucose, UA: NEGATIVE mg/dL
HGB URINE DIPSTICK: NEGATIVE
KETONES UR: NEGATIVE mg/dL
Leukocytes, UA: NEGATIVE
Nitrite: NEGATIVE
Protein, ur: 100 mg/dL — AB
Specific Gravity, Urine: <1.005 — ABNORMAL LOW (ref 1.005–1.030)
UROBILINOGEN UA: 0.2 mg/dL (ref 0.0–1.0)
pH: 6 (ref 5.0–8.0)

## 2013-11-16 LAB — URINALYSIS, MICROSCOPIC ONLY
BACTERIA UA: NONE SEEN
CASTS: NONE SEEN
CRYSTALS: NONE SEEN
SQUAMOUS EPITHELIAL / LPF: NONE SEEN

## 2013-11-16 NOTE — Assessment & Plan Note (Signed)
Lab Results  Component Value Date   HGBA1C 8.3 11/15/2013   HGBA1C 8.6 06/28/2013   HGBA1C 7.7 04/12/2013     Assessment: Diabetes control: fair control Progress toward A1C goal:  improved Comments: She is on Lantus 30 units in am and 20 units in the evening with Novolog SSI of 5-8 units. She beings her logbook during this visit (not her CBG meter download) with at least 3 low fasting CBG of 69-81 this month. She states she had not been eating as much those days. There is one high reading of 200 after dinner but overall her readings are close to 140. She states that she has recently drastically improved her diet and is planing on starting an exercise program.   Plan: Medications:  continue current medications Home glucose monitoring: Frequency: 3 times a day Timing: before meals;at bedtime Instruction/counseling given: reminded to get eye exam, reminded to bring blood glucose meter & log to each visit, reminded to bring medications to each visit, discussed foot care, discussed the need for weight loss, discussed diet and discussed sick day management Educational resources provided: brochure Self management tools provided: home glucose logbook Other plans: She will follow up in 1-2 weeks. She was encouraged to continue eating a healthier diet and to bring her CBG meter with her.

## 2013-11-16 NOTE — Progress Notes (Signed)
   Subjective:    Patient ID: Susan Evans, female    DOB: December 26, 1955, 58 y.o.   MRN: [id number]  Diabetes Pertinent negatives for hypoglycemia include no dizziness or headaches. Pertinent negatives for diabetes include no chest pain and no fatigue.   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 She brings her CBG readings logbook with her. She tells me that for the months of November and December and consequentially gained weight and had higher blood sugar readings. Since January she has been eating healthier and notes that she has had low blood sugars. Her symptoms of hypoglycemia are nonspecific but she notes that she feels "funny" all over when her blood sugar is low and usually drinks a Cola to raise her blood sugar quickly.    Review of Systems  Constitutional: Negative for fever, chills, diaphoresis, activity change, appetite change, fatigue and unexpected weight change.  Respiratory: Negative for choking, shortness of breath and wheezing.        Chronic hoarseness since her SCC radiation treatment  Cardiovascular: Negative for chest pain, palpitations and leg swelling.  Gastrointestinal: Negative for abdominal pain, diarrhea and constipation.  Genitourinary: Negative for dysuria.  Musculoskeletal: Negative for back pain.  Neurological: Negative for dizziness, light-headedness and headaches.  Psychiatric/Behavioral: Negative for agitation.       Objective:   Physical Exam  Nursing note and vitals reviewed. Constitutional: She is oriented to person, place, and time. She appears well-developed and well-nourished. No distress.  Hoarseness   Eyes: Conjunctivae are normal. No scleral icterus.  Cardiovascular: Normal rate and regular rhythm.   Pulmonary/Chest: Effort normal. No respiratory distress.  She has no wheezes. She has no rales.  Abdominal: Soft. There is no tenderness.  Musculoskeletal: She exhibits no edema.  Neurological: She is alert and oriented to person, place, and time.  Skin: Skin is warm and dry. She is not diaphoretic.  Psychiatric: She has a normal mood and affect.          Assessment & Plan:

## 2013-11-16 NOTE — Assessment & Plan Note (Signed)
She is also followed by Endocrinology with most recent TSH of 25 and per last note, may need concomitant levothyroxine tx. She has follow up early next month.

## 2013-11-17 LAB — MICROALBUMIN / CREATININE URINE RATIO
Creatinine, Urine: 14.7 mg/dL
Microalb Creat Ratio: 3234 mg/g — ABNORMAL HIGH (ref 0.0–30.0)
Microalb, Ur: 47.54 mg/dL — ABNORMAL HIGH (ref 0.00–1.89)

## 2013-11-17 NOTE — Assessment & Plan Note (Signed)
She continues to take Calcium and Vitamin D (crushed) but with low consumption of Calcium rich foods. She declines additional pharmacological treatment for her osteoporosis at this time including Boniva, Reclast, or Prolia. She wants to try conservative treatment with resistance exercises and increased consumption of calcium rich foods. A  brief list of calcium rich foods was provided to her during this visit.  She denies falls and declines a PT fall assessment as she feels like her gait is stable. She understands that she has increased risk of fracture with osteoporosis and wants more time to think about starting another medication/treatment.  Will continue assessment for fall and further discuss other treatment options during her next visit.

## 2013-11-17 NOTE — Assessment & Plan Note (Signed)
Negative mammogram in August 2014, no complaint of nipple discharge during this visit.

## 2013-11-17 NOTE — Assessment & Plan Note (Signed)
BP Readings from Last 3 Encounters:  11/15/13 168/90  06/28/13 178/89  04/12/13 136/85    Lab Results  Component Value Date   NA 140 04/07/2013   K 4.1 04/07/2013   CREATININE 0.8 04/07/2013    Assessment: Blood pressure control: mildly elevated Progress toward BP goal:  improved Comments: She is on amlodipine 10mg  daily, lisinopril 40mg  daily, metoprolol 25mg  BID  Plan: Medications:  continue current medications and increase metoprolol to 50mg  BID>  Educational resources provided: brochure Self management tools provided: home blood pressure logbook Other plans: She has HR of 90s. Her elevated BP and HR could be 2/2 her thyroid disease. Will increase metoprolol for now with close monitoring of her BP within 1-2 weeks. Given her proteinuria, tight BP control is ideal. She may benefit from thiazide diuretic if her BP is persistently elevated. She will need repeat labs (CMP during her next visit).

## 2013-11-17 NOTE — Assessment & Plan Note (Signed)
Urinalysis with >100 protein Urine microalbumin/cr pending.  Likely 2/2 uncontrolled HTN/DM2.

## 2013-11-17 NOTE — Progress Notes (Signed)
INTERNAL MEDICINE TEACHING ATTENDING ADDENDUM - Jsoeph Podesta, DO: I reviewed and discussed at the time of visit with the resident Dr. Kennerly, the patient's medical history, physical examination, diagnosis and results of tests and treatment and I agree with the patient's care as documented. 

## 2013-11-29 ENCOUNTER — Ambulatory Visit (INDEPENDENT_AMBULATORY_CARE_PROVIDER_SITE_OTHER): Payer: Medicaid Other | Admitting: Internal Medicine

## 2013-11-29 ENCOUNTER — Encounter: Payer: Self-pay | Admitting: Internal Medicine

## 2013-11-29 VITALS — BP 147/93 | HR 78 | Temp 99.0°F | Wt 128.6 lb

## 2013-11-29 DIAGNOSIS — E05 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: Secondary | ICD-10-CM

## 2013-11-29 DIAGNOSIS — R809 Proteinuria, unspecified: Secondary | ICD-10-CM

## 2013-11-29 DIAGNOSIS — E119 Type 2 diabetes mellitus without complications: Secondary | ICD-10-CM

## 2013-11-29 DIAGNOSIS — I10 Essential (primary) hypertension: Secondary | ICD-10-CM

## 2013-11-29 LAB — COMPLETE METABOLIC PANEL WITH GFR
ALT: 19 U/L (ref 0–35)
AST: 22 U/L (ref 0–37)
Albumin: 4.1 g/dL (ref 3.5–5.2)
Alkaline Phosphatase: 126 U/L — ABNORMAL HIGH (ref 39–117)
BILIRUBIN TOTAL: 0.2 mg/dL (ref 0.2–1.2)
BUN: 8 mg/dL (ref 6–23)
CO2: 28 meq/L (ref 19–32)
Calcium: 9.8 mg/dL (ref 8.4–10.5)
Chloride: 100 mEq/L (ref 96–112)
Creat: 0.67 mg/dL (ref 0.50–1.10)
GFR, Est Non African American: >89 mL/min
Glucose, Bld: 156 mg/dL — ABNORMAL HIGH (ref 70–99)
Potassium: 3.9 mEq/L (ref 3.5–5.3)
Sodium: 137 mEq/L (ref 135–145)
Total Protein: 6.7 g/dL (ref 6.0–8.3)

## 2013-11-29 MED ORDER — HYDROCHLOROTHIAZIDE 25 MG PO TABS: 25.0000 mg | ORAL_TABLET | Freq: Every day | ORAL | Status: DC

## 2013-11-29 NOTE — Assessment & Plan Note (Signed)
BP Readings from Last 3 Encounters:  11/29/13 147/93  11/15/13 168/90  06/28/13 178/89    Lab Results  Component Value Date   NA 140 04/07/2013   K 4.1 04/07/2013   CREATININE 0.8 04/07/2013    Assessment: Blood pressure control: mildly elevated Progress toward BP goal:  improved Comments: She is on lisinopril 40mg , metoprolol 50mg  BID, Norvasc 10mg  daily and assures compliance with BP still elevated today.   Plan: Medications:  continue current medications and start HCTZ 25mg  daily.  Educational resources provided:   Self management tools provided:   Other plans: Patient asked to return in 1-2 weeks for BP monitoring and side effects monitoring. Will check CMP today.

## 2013-11-29 NOTE — Progress Notes (Signed)
   Subjective:    Patient ID: Susan Evans, female    DOB: 1956/01/15, 58 y.o.   MRN: [id number]  Hypertension Pertinent negatives include no chest pain, headaches or palpitations.   Susan Evans is a pleasant 58 y old woman with PMH and DM2 who comes in today for follow up for her HTN. She states that she has had a cold for the past 2-3 days that is getting better with Robitussin. She denies taking cough medications or nasal decongestants. She assure compliance with her increased dose of metoprolol, lisinopril, and Norvasc.    Review of Systems  Constitutional: Negative for fever and chills.  HENT: Positive for congestion. Negative for ear pain.   Respiratory: Positive for cough.        Dry cough, chronic hoarseness   Cardiovascular: Negative for chest pain, palpitations and leg swelling.  Gastrointestinal: Negative for abdominal pain.  Neurological: Negative for dizziness, light-headedness and headaches.  Psychiatric/Behavioral: Negative for behavioral problems and agitation.       Objective:   Physical Exam  Nursing note and vitals reviewed. Constitutional: She is oriented to person, place, and time. She appears well-developed and well-nourished. No distress.  Eyes: Conjunctivae are normal. Right eye exhibits no discharge. Left eye exhibits no discharge.  Cardiovascular: Normal rate and regular rhythm.   Pulmonary/Chest: No respiratory distress. She has no wheezes. She has no rales.  Abdominal: She exhibits no distension.  Musculoskeletal: She exhibits no edema and no tenderness.  Neurological: She is alert and oriented to person, place, and time.  Skin: Skin is warm and dry. She is not diaphoretic. No erythema.  Psychiatric: She has a normal mood and affect. Her behavior is normal.          Assessment & Plan:

## 2013-11-29 NOTE — Assessment & Plan Note (Signed)
I discussed the findings of her UA and urine Cr/albumin ratio collected during her last visit that indicate that her renal function may be worsening. I discussed with her that BP control and diabetes control will be very important to prevent further progression of her renal disease. She agreed and understood.  -Check CMP today -Starting HCTZ per below.

## 2013-11-29 NOTE — Assessment & Plan Note (Signed)
Lab Results  Component Value Date   HGBA1C 8.3 11/15/2013   HGBA1C 8.6 06/28/2013   HGBA1C 7.7 04/12/2013     Assessment: Diabetes control: fair control Progress toward A1C goal:  unchanged Comments: Patient asked to bring her CBG meter for further discussion of her diabetes. Given the progression of her renal disease with proteinuria, she will need better DM2 control.   Plan: Medications:  continue current medications Home glucose monitoring: Frequency:   Timing:   Instruction/counseling given: reminded to get eye exam Educational resources provided:   Self management tools provided:   Other plans: She did not have time to have her retinal scan done today, she agrees to return in 1-2 weeks for this eye exam.

## 2013-11-29 NOTE — Patient Instructions (Signed)
General Instructions: -Your blood pressure is still elevated today.  -Start taking hydrochlorothiazide 25 mg daily in addition to the metoprolol for your blood pressure.  -Follow up in 1-2 weeks for blood pressure recheck and for your retina scan (eye exam for diabetes)    Treatment Goals:  Goals (1 Years of Data) as of 11/29/13         As of Today As of Today 11/15/13 06/28/13 04/12/13     Blood Pressure    . Blood Pressure < 140/90  147/93 194/89 168/90 178/89 136/85     Result Component    . HEMOGLOBIN A1C < 7.0    8.3 8.6 7.7    . LDL CALC < 100     76       Progress Toward Treatment Goals:  Treatment Goal 11/29/2013  Hemoglobin A1C unchanged  Blood pressure improved    Self Care Goals & Plans:  Self Care Goal 11/15/2013  Manage my medications take my medicines as prescribed; refill my medications on time  Monitor my health keep track of my blood glucose; bring my glucose meter and log to each visit; check my feet daily  Eat healthy foods drink diet soda or water instead of juice or soda; eat foods that are low in salt; eat baked foods instead of fried foods  Be physically active find an activity I enjoy; park at the far end of the parking lot  Meeting treatment goals maintain the current self-care plan    Home Blood Glucose Monitoring 11/15/2013  Check my blood sugar 3 times a day  When to check my blood sugar before meals; at bedtime     Care Management & Community Referrals:  Referral 11/15/2013  Referrals made for care management support none needed  Referrals made to community resources -

## 2013-11-30 NOTE — Assessment & Plan Note (Signed)
Pt was off methimazole per note from her Endocrinologist but she thinks she is now back on it--she did not bring her medications with her. Will request records from her Endocrinologist for most recent visit.

## 2013-12-01 ENCOUNTER — Other Ambulatory Visit: Payer: Self-pay | Admitting: *Deleted

## 2013-12-01 DIAGNOSIS — E119 Type 2 diabetes mellitus without complications: Secondary | ICD-10-CM

## 2013-12-01 NOTE — Progress Notes (Signed)
Case discussed with Dr. Kennerly soon after the resident saw the patient.  We reviewed the resident's history and exam and pertinent patient test results.  I agree with the assessment, diagnosis, and plan of care documented in the resident's note. 

## 2013-12-02 MED ORDER — "INSULIN SYRINGE-NEEDLE U-100 31G X 5/16"" 0.5 ML MISC": Status: DC

## 2013-12-13 ENCOUNTER — Encounter: Payer: Medicaid Other | Admitting: Dietician

## 2013-12-13 ENCOUNTER — Ambulatory Visit (INDEPENDENT_AMBULATORY_CARE_PROVIDER_SITE_OTHER): Payer: Medicaid Other | Admitting: Internal Medicine

## 2013-12-13 ENCOUNTER — Encounter: Payer: Self-pay | Admitting: Dietician

## 2013-12-13 ENCOUNTER — Encounter: Payer: Self-pay | Admitting: Internal Medicine

## 2013-12-13 ENCOUNTER — Encounter: Payer: Medicaid Other | Admitting: Internal Medicine

## 2013-12-13 VITALS — BP 133/83 | HR 91 | Temp 98.4°F | Wt 124.9 lb

## 2013-12-13 DIAGNOSIS — E119 Type 2 diabetes mellitus without complications: Secondary | ICD-10-CM

## 2013-12-13 DIAGNOSIS — E05 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: Secondary | ICD-10-CM

## 2013-12-13 DIAGNOSIS — E871 Hypo-osmolality and hyponatremia: Secondary | ICD-10-CM

## 2013-12-13 DIAGNOSIS — I10 Essential (primary) hypertension: Secondary | ICD-10-CM

## 2013-12-13 LAB — BASIC METABOLIC PANEL WITH GFR
BUN: 10 mg/dL (ref 6–23)
CHLORIDE: 92 meq/L — AB (ref 96–112)
CO2: 27 mEq/L (ref 19–32)
Calcium: 9.5 mg/dL (ref 8.4–10.5)
Creat: 0.76 mg/dL (ref 0.50–1.10)
GFR, EST NON AFRICAN AMERICAN: 87 mL/min
GFR, Est African American: >89 mL/min
Glucose, Bld: 212 mg/dL — ABNORMAL HIGH (ref 70–99)
POTASSIUM: 3.3 meq/L — AB (ref 3.5–5.3)
Sodium: 133 mEq/L — ABNORMAL LOW (ref 135–145)

## 2013-12-13 LAB — HM DIABETES EYE EXAM

## 2013-12-13 NOTE — Patient Instructions (Addendum)
General Instructions: -Your blood pressure is great today! Continue taking all your medications as prescribed.  -Resume the 5 units of Novolog with breakfast, 10 units of Novolog with lunch and 8 units with dinner as prescribed before.  --Bring your sugar meter with you to your next visit.  -Follow up with [place] in 3 months for diabetes and blood pressure follow up.  -It was great seeing you again today!   Treatment Goals:  Goals (1 Years of Data) as of 12/13/13         As of Today 11/29/13 11/29/13 11/15/13 06/28/13     Blood Pressure    . Blood Pressure < 140/90  133/83 147/93 194/89 168/90 178/89     Result Component    . HEMOGLOBIN A1C < 7.0     8.3 8.6    . LDL CALC < 100      76      Progress Toward Treatment Goals:  Treatment Goal 11/29/2013  Hemoglobin A1C unchanged  Blood pressure improved    Self Care Goals & Plans:  Self Care Goal 11/15/2013  Manage my medications take my medicines as prescribed; refill my medications on time  Monitor my health keep track of my blood glucose; bring my glucose meter and log to each visit; check my feet daily  Eat healthy foods drink diet soda or water instead of juice or soda; eat foods that are low in salt; eat baked foods instead of fried foods  Be physically active find an activity I enjoy; park at the far end of the parking lot  Meeting treatment goals maintain the current self-care plan    Home Blood Glucose Monitoring 11/15/2013  Check my blood sugar 3 times a day  When to check my blood sugar before meals; at bedtime     Care Management & Community Referrals:  Referral 11/15/2013  Referrals made for care management support none needed  Referrals made to community resources -

## 2013-12-14 NOTE — Assessment & Plan Note (Signed)
Lab Results  Component Value Date   HGBA1C 8.3 11/15/2013   HGBA1C 8.6 06/28/2013   HGBA1C 7.7 04/12/2013     Assessment: Diabetes control:  Not contolled Progress toward A1C goal:   Not at goal Comments: She has been using Lantus 30 units in the morning and 20 units at night. She has been using Novolog 5 units with meals instead of 5 unit with Bkfst, 10 units with lunch, and 8 units with dinner  Plan: Medications:  continue current medications Home glucose monitoring: Frequency:   Timing:   Instruction/counseling given: reminded to get eye exam, reminded to bring blood glucose meter & log to each visit and reminded to bring medications to each visit Educational resources provided:   Self management tools provided:   Other plans: Patient to resume her prescribed dose of Novolog insulin. Denies hypoglycemia. Will follow up with the [facility] in 3 months for her diabetes.

## 2013-12-14 NOTE — Assessment & Plan Note (Addendum)
Lab from earlier this month with no hyponatremia. She has been started on HCTZ and now has a mild hyponatremia with sodium of 133. Although this may be due to hypothyroidism, given her history of Na as low as 120s while taking HCTZ, will discontinue this medications at this time.  12/15/13: called pt to inform her of the lab findings and instructed her to discontinue taking HCTZ. She verbalized understanding and agreed with this plan.

## 2013-12-14 NOTE — Assessment & Plan Note (Signed)
She is off methimazole but is not on synthroid. Last visit report here from 10/14 with TSH of 25, indicating possible need for synthroid initiation. Will contact Dr. Cindra Eves office for more recent lab visits and order repeat TSH if no recent results are available.

## 2013-12-14 NOTE — Progress Notes (Signed)
   Subjective:    Patient ID: Susan Evans, female    DOB: January 21, 1956, 58 y.o.   MRN: [id number]  Hypertension Associated symptoms include palpitations. Pertinent negatives include no shortness of breath.   Susan Evans is a 58 year old woman with PMH of DM2, HTN, and Graves disease who presents for follow up visit for BP recheck since restarting HCTZ. She denies dizziness with this medication. Of note, she has been off methimazole since last fall and has a follow up appointment with Dr. Buddy Duty, her Endocrinologist.    Review of Systems  Constitutional: Negative for fever, chills, diaphoresis, activity change, appetite change and fatigue.  Respiratory: Negative for shortness of breath and wheezing.        Hoarseness   Cardiovascular: Positive for palpitations.       Occasional heart palpitations  Genitourinary: Negative for dysuria.  Neurological: Negative for dizziness and light-headedness.       Objective:   Physical Exam  Nursing note and vitals reviewed. Constitutional: She is oriented to person, place, and time. No distress.  Hoarseness   Cardiovascular: Normal rate and regular rhythm.   Pulmonary/Chest: Effort normal. No respiratory distress. She has no wheezes. She has no rales.  Abdominal: Soft.  Musculoskeletal: She exhibits no edema and no tenderness.  Neurological: She is alert and oriented to person, place, and time.  Skin: Skin is warm and dry. She is not diaphoretic.  Psychiatric: She has a normal mood and affect. Her behavior is normal.          Assessment & Plan:

## 2013-12-14 NOTE — Assessment & Plan Note (Addendum)
BP Readings from Last 3 Encounters:  12/13/13 133/83  11/29/13 147/93  11/15/13 168/90    Lab Results  Component Value Date   NA 133* 12/13/2013   K 3.3* 12/13/2013   CREATININE 0.76 12/13/2013    Assessment: Blood pressure control:  Controlled Progress toward BP goal:   At goal Comments: She takes amlodipine 10mg  daily, Lisinopril 40mg  daily, and HCTZ 25 mg daily (this was started during her previous visit this month)  Plan: Medications:  Will discontinue HCTZ, add spironolactone 25mg  daily with follow up in 2-3 weeks Educational resources provided:   Self management tools provided:   Other plans: Ordered BMET during this visit for monitoring of possible hyponatremia as pt had suspected low Na in the past while on HCTZ with eventual discontinuation of this medication. Her hyponatremia was, however, thought to be due to her hypothyroidism with TSH of 40 at that time but she remained off HCTZ until 2-3 weeks ago. Her BMET, which resulted after this visit, showed a mild hyponatremia with Na of 133. At this time, will discontinue the HCTZ. TSH and fT4 added on.  12/15/13: Called pt to discuss lab findings and instructed her to stop taking HCTZ and start taking spironolactone tomorrow.  Patient asked to follow up with in 1 week for repeat BMET specially with lisinopril and spironolactone use.   Updates discussed with Dr. Lynnae January on 12/15/13.

## 2013-12-15 MED ORDER — SPIRONOLACTONE 25 MG PO TABS: 25.0000 mg | ORAL_TABLET | Freq: Every day | ORAL | Status: DC

## 2013-12-16 LAB — TSH: TSH: 0.023 u[IU]/mL — ABNORMAL LOW (ref 0.350–4.500)

## 2013-12-16 LAB — T4, FREE: FREE T4: 1.45 ng/dL (ref 0.80–1.80)

## 2013-12-20 NOTE — Progress Notes (Signed)
Case discussed with Dr. Kennerly soon after the resident saw the patient.  We reviewed the resident's history and exam and pertinent patient test results.  I agree with the assessment, diagnosis, and plan of care documented in the resident's note. 

## 2014-01-06 ENCOUNTER — Ambulatory Visit (INDEPENDENT_AMBULATORY_CARE_PROVIDER_SITE_OTHER): Payer: Medicaid Other | Admitting: Internal Medicine

## 2014-01-06 ENCOUNTER — Encounter: Payer: Self-pay | Admitting: Internal Medicine

## 2014-01-06 VITALS — BP 137/83 | HR 88 | Temp 98.1°F | Wt 126.0 lb

## 2014-01-06 DIAGNOSIS — E119 Type 2 diabetes mellitus without complications: Secondary | ICD-10-CM

## 2014-01-06 DIAGNOSIS — E05 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: Secondary | ICD-10-CM

## 2014-01-06 DIAGNOSIS — I10 Essential (primary) hypertension: Secondary | ICD-10-CM

## 2014-01-06 LAB — BASIC METABOLIC PANEL WITH GFR
BUN: 13 mg/dL (ref 6–23)
CALCIUM: 10.2 mg/dL (ref 8.4–10.5)
CO2: 29 mEq/L (ref 19–32)
Chloride: 98 mEq/L (ref 96–112)
Creat: 0.78 mg/dL (ref 0.50–1.10)
GFR, EST NON AFRICAN AMERICAN: 85 mL/min
GLUCOSE: 111 mg/dL — AB (ref 70–99)
POTASSIUM: 4.2 meq/L (ref 3.5–5.3)
SODIUM: 135 meq/L (ref 135–145)

## 2014-01-06 MED ORDER — METHIMAZOLE 5 MG PO TABS: 5.0000 mg | ORAL_TABLET | Freq: Every day | ORAL | Status: DC

## 2014-01-06 NOTE — Patient Instructions (Signed)
-  Your blood pressure is better today. Continue taking the new medicine for your blood pressure.  -Follow up with [place] for diabetes care in June.   Please bring your medicines with you each time you come.   Medicines may be  Eye drops  Herbal   Vitamins  Pills  Seeing these help [place] take care of you.

## 2014-01-07 ENCOUNTER — Other Ambulatory Visit: Payer: Self-pay | Admitting: *Deleted

## 2014-01-07 DIAGNOSIS — E119 Type 2 diabetes mellitus without complications: Secondary | ICD-10-CM

## 2014-01-07 MED ORDER — "INSULIN SYRINGE-NEEDLE U-100 31G X 5/16"" 0.5 ML MISC": Status: DC

## 2014-01-07 NOTE — Assessment & Plan Note (Signed)
She has seen Dr. Buddy Duty last week and is back on methimazole 5mg  daily due to low TSH.

## 2014-01-07 NOTE — Progress Notes (Signed)
   Subjective:    Patient ID: Susan Evans, female    DOB: [birth year], 58 y.o.   MRN: [id number]  Hypertension Pertinent negatives include no chest pain, palpitations or shortness of breath.   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  She states that she has seen Dr. Buddy Duty, her Endocrinologist after her visit with me and is back on methimazole 5mg  daily due to low TSH level.     Review of Systems  Constitutional: Negative for fever, chills, diaphoresis, activity change, appetite change, fatigue and unexpected weight change.  HENT:       Chronic hoarseness    Respiratory: Positive for cough. Negative for shortness of breath and wheezing.   Cardiovascular: Negative for chest pain, palpitations and leg swelling.  Gastrointestinal: Negative for abdominal pain.  Genitourinary: Negative for dysuria.  Neurological: Negative for dizziness, weakness and light-headedness.  Psychiatric/Behavioral: Negative for agitation.       Objective:   Physical Exam  Nursing note and vitals reviewed. Constitutional: She is oriented to person, place, and time. She appears well-developed and well-nourished. No distress.  HENT:  hoarseness   Cardiovascular: Normal rate and regular rhythm.   Pulmonary/Chest: Effort normal. No respiratory distress. She has no wheezes. She has no rales.  scattered rhonchi that are cleared with cough  Abdominal: Soft. There is no tenderness.  Musculoskeletal: She exhibits no edema.  Neurological: She is alert and oriented to person, place, and time.  Skin: Skin is warm and dry. She is not diaphoretic.  Psychiatric: She has a normal mood and affect.          Assessment & Plan:

## 2014-01-07 NOTE — Assessment & Plan Note (Signed)
BP Readings from Last 3 Encounters:  01/06/14 137/83  12/13/13 133/83  11/29/13 147/93    Lab Results  Component Value Date   NA 135 01/06/2014   K 4.2 01/06/2014   CREATININE 0.78 01/06/2014    Assessment: Blood pressure control:  Controlled Progress toward BP goal:   at goal Comments: She is on lisinopril 40mg  daily, Norvasc 10mg  daily, Lopressor 50mg  BID, and spironolactone 25 mg daily (started during her last visit). HCTZ discontinued during previous visit due to hyponatremia.   Plan: Medications:  continue current medications Educational resources provided: brochure Self management tools provided: home blood pressure logbook Other plans: Checked BMET during this visit, Cr at baseline, K normal at 4.2 (pt has had chronic hypokalemia). Pt to follow up in 3 months.

## 2014-01-10 NOTE — Progress Notes (Signed)
INTERNAL MEDICINE TEACHING ATTENDING ADDENDUM - Tashina Credit, MD: I reviewed and discussed at the time of visit with the resident Dr. Kennerly, the patient's medical history, physical examination, diagnosis and results of tests and treatment and I agree with the patient's care as documented. 

## 2014-01-26 ENCOUNTER — Other Ambulatory Visit: Payer: Self-pay | Admitting: Internal Medicine

## 2014-01-26 ENCOUNTER — Other Ambulatory Visit: Payer: Self-pay | Admitting: *Deleted

## 2014-01-26 DIAGNOSIS — E785 Hyperlipidemia, unspecified: Secondary | ICD-10-CM

## 2014-01-26 MED ORDER — AMLODIPINE BESYLATE 10 MG PO TABS: 10.0000 mg | ORAL_TABLET | Freq: Every day | ORAL | Status: DC

## 2014-02-03 ENCOUNTER — Other Ambulatory Visit: Payer: Self-pay | Admitting: *Deleted

## 2014-02-03 DIAGNOSIS — E119 Type 2 diabetes mellitus without complications: Secondary | ICD-10-CM

## 2014-02-03 MED ORDER — INSULIN PEN NEEDLE 31G X 8 MM MISC: Status: DC

## 2014-02-03 MED ORDER — "INSULIN SYRINGE-NEEDLE U-100 31G X 5/16"" 0.5 ML MISC": Status: DC

## 2014-02-09 ENCOUNTER — Other Ambulatory Visit: Payer: Self-pay | Admitting: *Deleted

## 2014-02-09 DIAGNOSIS — E119 Type 2 diabetes mellitus without complications: Secondary | ICD-10-CM

## 2014-02-09 MED ORDER — GLUCOSE BLOOD VI STRP: ORAL_STRIP | Status: DC

## 2014-02-16 ENCOUNTER — Telehealth: Payer: Self-pay | Admitting: Hematology and Oncology

## 2014-02-16 NOTE — Telephone Encounter (Signed)
pt cld wanting to know when appt was-adv it was 7/16 @ 10:30-pt understood

## 2014-02-27 ENCOUNTER — Other Ambulatory Visit: Payer: Self-pay | Admitting: Internal Medicine

## 2014-02-28 NOTE — Telephone Encounter (Signed)
This medications has been permanently discontinued as it has caused her to be hyponatremic.

## 2014-03-02 ENCOUNTER — Other Ambulatory Visit: Payer: Self-pay | Admitting: Internal Medicine

## 2014-03-02 NOTE — Telephone Encounter (Signed)
Pt called - stated she was instructed by the pharmacy to call us; asked pt if she is still taking HCTZ and said yes "I didn't know I was suppose to stop taking it' Pt instructed to stop taking this medication as noted per Dr Hayes Ludwig; pt voiced understanding.

## 2014-03-02 NOTE — Telephone Encounter (Signed)
HCTZ has been permanently discontinued due to hyponatremia.

## 2014-03-04 ENCOUNTER — Other Ambulatory Visit: Payer: Self-pay | Admitting: Internal Medicine

## 2014-03-21 ENCOUNTER — Encounter: Payer: Medicaid Other | Admitting: Internal Medicine

## 2014-04-06 ENCOUNTER — Other Ambulatory Visit: Payer: Self-pay | Admitting: Hematology and Oncology

## 2014-04-06 DIAGNOSIS — C140 Malignant neoplasm of pharynx, unspecified: Secondary | ICD-10-CM

## 2014-04-07 ENCOUNTER — Other Ambulatory Visit (HOSPITAL_BASED_OUTPATIENT_CLINIC_OR_DEPARTMENT_OTHER): Payer: Medicaid Other

## 2014-04-07 ENCOUNTER — Encounter: Payer: Self-pay | Admitting: Hematology and Oncology

## 2014-04-07 ENCOUNTER — Ambulatory Visit (HOSPITAL_BASED_OUTPATIENT_CLINIC_OR_DEPARTMENT_OTHER): Payer: Medicaid Other | Admitting: Hematology and Oncology

## 2014-04-07 VITALS — BP 155/66 | HR 80 | Temp 98.5°F | Resp 18 | Ht 62.0 in | Wt 127.7 lb

## 2014-04-07 DIAGNOSIS — E119 Type 2 diabetes mellitus without complications: Secondary | ICD-10-CM

## 2014-04-07 DIAGNOSIS — C140 Malignant neoplasm of pharynx, unspecified: Secondary | ICD-10-CM

## 2014-04-07 DIAGNOSIS — E0590 Thyrotoxicosis, unspecified without thyrotoxic crisis or storm: Secondary | ICD-10-CM

## 2014-04-07 LAB — CBC WITH DIFFERENTIAL/PLATELET
BASO%: 0.1 % (ref 0.0–2.0)
Basophils Absolute: 0 10*3/uL (ref 0.0–0.1)
EOS ABS: 0.1 10*3/uL (ref 0.0–0.5)
EOS%: 1.2 % (ref 0.0–7.0)
HCT: 37.4 % (ref 34.8–46.6)
HGB: 11.9 g/dL (ref 11.6–15.9)
LYMPH%: 25 % (ref 14.0–49.7)
MCH: 23.5 pg — AB (ref 25.1–34.0)
MCHC: 31.8 g/dL (ref 31.5–36.0)
MCV: 73.9 fL — AB (ref 79.5–101.0)
MONO#: 0.5 10*3/uL (ref 0.1–0.9)
MONO%: 6.8 % (ref 0.0–14.0)
NEUT#: 4.9 10*3/uL (ref 1.5–6.5)
NEUT%: 66.9 % (ref 38.4–76.8)
PLATELETS: 215 10*3/uL (ref 145–400)
RBC: 5.06 10*6/uL (ref 3.70–5.45)
RDW: 14.5 % (ref 11.2–14.5)
WBC: 7.4 10*3/uL (ref 3.9–10.3)
lymph#: 1.8 10*3/uL (ref 0.9–3.3)

## 2014-04-07 LAB — COMPREHENSIVE METABOLIC PANEL (CC13)
ALBUMIN: 4.1 g/dL (ref 3.5–5.0)
ALK PHOS: 113 U/L (ref 40–150)
ALT: 27 U/L (ref 0–55)
AST: 24 U/L (ref 5–34)
Anion Gap: 9 mEq/L (ref 3–11)
BUN: 12.5 mg/dL (ref 7.0–26.0)
CALCIUM: 10.3 mg/dL (ref 8.4–10.4)
CHLORIDE: 95 meq/L — AB (ref 98–109)
CO2: 27 mEq/L (ref 22–29)
Creatinine: 1.1 mg/dL (ref 0.6–1.1)
Glucose: 258 mg/dl — ABNORMAL HIGH (ref 70–140)
POTASSIUM: 4 meq/L (ref 3.5–5.1)
SODIUM: 131 meq/L — AB (ref 136–145)
TOTAL PROTEIN: 7.9 g/dL (ref 6.4–8.3)
Total Bilirubin: 0.35 mg/dL (ref 0.20–1.20)

## 2014-04-07 LAB — TSH CHCC: TSH: 0.904 m(IU)/L (ref 0.308–3.960)

## 2014-04-07 LAB — T4, FREE: Free T4: 1.08 ng/dL (ref 0.80–1.80)

## 2014-04-07 NOTE — Assessment & Plan Note (Signed)
Clinically, she has no evidence of recurrence. I would discharge her from the clinic and recommend yearly ENT followup.

## 2014-04-07 NOTE — Progress Notes (Signed)
[facility] FOLLOW-UP progress notes  Patient Care Team: Blain Pais, MD as PCP - General (Internal Medicine) Johna Sheriff, MD as Consulting Physician (Ophthalmology)  CHIEF COMPLAINTS/PURPOSE OF VISIT:  Squamous cell carcinoma of the oropharynx  HISTORY OF PRESENTING ILLNESS:  Susan Evans 58 y.o. female was transferred to my care after her prior physician has left.  I reviewed the patient's records extensive and collaborated the history with the patient. 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 She underwent transoral robotic resections by da Vinci robot by Dr. Francina Ames on December 22, 2009.  She underwent submandibular gland excision, left neck dissections level 3 and 4 and left neck dissection 2A, 2B and 5.  She underwent adjuvant chemotherapy with weekly cisplatin and daily XRT between 02/23/10 and 04/13/10. She has mild persistent dry mouth. She has regular ENT followup. She denies dysphagia or weight loss.  MEDICAL HISTORY:  Past Medical History  Diagnosis Date  . Diabetes mellitus   . Hyperlipidemia   . Hypertension   . GERD (gastroesophageal reflux disease)   . Tobacco abuse     PT HAS QUIT  . Iron deficiency anemia   . Menopause   . Ganglion cyst     right wrist  . 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 Thyroid disease     Hyperthyroidism  . History of radiation therapy 02/27/10 -04/13/10    left base of tongue, bilat neck    SURGICAL HISTORY: Past Surgical History  Procedure Laterality Date  . Neck surgery      SOCIAL HISTORY: History   Social History  . Marital Status: Single    Spouse Name: N/A    Number of Children: N/A  . Years of Education: 11   Occupational History  .    [employer]  Unemployed    Social History Main Topics  . Smoking status: Former Smoker -- 0.00 packs/day    Quit date: 02/09/2009  . Smokeless tobacco: Never Used  . Alcohol Use: No  . Drug Use: No  . Sexual Activity: Yes   Other Topics Concern  . Not on file   Social History Narrative  . No narrative on file    FAMILY HISTORY: Family History  Problem Relation Age of Onset  . Stroke Father   . Cancer Father     unknown ca  . Cancer Paternal Aunt     colon ca    ALLERGIES:  is allergic to hydrochlorothiazide and penicillins.  MEDICATIONS:  Current Outpatient Prescriptions  Medication Sig Dispense Refill  . amLODipine (NORVASC) 10 MG tablet Take 1 tablet (10 mg total) by mouth daily.  30 tablet  11  . aspirin 81 MG EC tablet Take 81 mg by mouth daily.        . Blood Glucose Monitoring Suppl (ACCU-CHEK AVIVA PLUS) W/DEVICE KIT 1 Device by Does not apply route QID. Use to test blood glucose 4 times daily.dx:250.00  1 kit  0  . calcium-vitamin D (OSCAL 500/200 D-3) 500-200 MG-UNIT per tablet Take 1 tablet by mouth 2 (two) times daily.  180 tablet  3  . glucose blood (ACCU-CHEK AVIVA PLUS) test strip Use 3 to 4 times daily to check blood sugar. diag code 250.00. Insulin dependent.  150 each  11  .  insulin aspart (NOVOLOG FLEXPEN) 100 UNIT/ML injection 5 units with breakfast, 10 units with lunch, and 8 units with dinner.  5 pen  11  . Insulin Pen Needle (B-D ULTRAFINE III SHORT PEN) 31G X 8 MM MISC Use to give insulin 3 times daily  90 each  11  . Insulin Syringe-Needle U-100 (B-D INS SYRINGE 0.5CC/31GX5/16) 31G X 5/16" 0.5 ML MISC Use to give Insulin 3 times daily. Dx Code: 250.00. Insulin dependent  100 each  6  . LANTUS 100 UNIT/ML injection TAKE 30 UNITS SUBCUTANEOUSLY IN THE MORNING AND 20 UNITS IN THE EVENING  10 mL  12  . lisinopril (PRINIVIL,ZESTRIL) 40 MG tablet Take 1 tablet (40 mg total) by mouth daily.  30 tablet  11  . methimazole (TAPAZOLE) 5 MG tablet Take 1 tablet (5 mg total) by mouth  daily.  30 tablet  0  . metoprolol tartrate (LOPRESSOR) 50 MG tablet Take 1 tablet (50 mg total) by mouth 2 (two) times daily.  60 tablet  11  . omeprazole (PRILOSEC) 20 MG capsule Take 1 capsule (20 mg total) by mouth 2 (two) times daily before a meal.  60 capsule  9  . simvastatin (ZOCOR) 20 MG tablet Take 1 tablet (20 mg total) by mouth daily at 6 PM.  30 tablet  5  . spironolactone (ALDACTONE) 25 MG tablet TAKE 1 TABLET (25 MG TOTAL) BY MOUTH DAILY.  30 tablet  2   No current facility-administered medications for this visit.    REVIEW OF SYSTEMS:   Constitutional: Denies fevers, chills or abnormal night sweats Eyes: Denies blurriness of vision, double vision or watery eyes Ears, nose, mouth, throat, and face: Denies mucositis or sore throat Respiratory: Denies cough, dyspnea or wheezes Cardiovascular: Denies palpitation, chest discomfort or lower extremity swelling Gastrointestinal:  Denies nausea, heartburn or change in bowel habits Skin: Denies abnormal skin rashes Lymphatics: Denies new lymphadenopathy or easy bruising Neurological:Denies numbness, tingling or new weaknesses Behavioral/Psych: Mood is stable, no new changes  All other systems were reviewed with the patient and are negative.  PHYSICAL EXAMINATION: ECOG PERFORMANCE STATUS: 0 - Asymptomatic  Filed Vitals:   04/07/14 1016  BP: 155/66  Pulse: 80  Temp: 98.5 F (36.9 C)  Resp: 18   Filed Weights   04/07/14 1016  Weight: 127 lb 11.2 oz (57.924 kg)    GENERAL:alert, no distress and comfortable. She looks thin but not cachectic  SKIN: skin color, texture, turgor are normal, no rashes or significant lesions EYES: normal, conjunctiva are pink and non-injected, sclera clear OROPHARYNX:no exudate, normal lips, buccal mucosa, and tongue  NECK:  significant surgical scars. 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  LYMPH:  no palpable lymphadenopathy in the cervical, axillary or inguinal LUNGS: clear to  auscultation and percussion with normal breathing effort HEART: regular rate & rhythm and no murmurs without lower extremity edema ABDOMEN:abdomen soft, non-tender and normal bowel sounds Musculoskeletal:no cyanosis of digits and no clubbing  PSYCH: alert & oriented x 3 with fluent speech NEURO: no focal motor/sensory deficits  LABORATORY DATA:  I have reviewed the data as listed Lab Results  Component Value Date   WBC 7.4 04/07/2014   HGB 11.9 04/07/2014   HCT 37.4 04/07/2014   MCV 73.9* 04/07/2014   PLT 215 04/07/2014    Recent Labs  11/29/13 1347 12/13/13 1510 01/06/14 1358 04/07/14 0943  NA 137 133* 135 131*  K 3.9 3.3* 4.2 4.0  CL 100 92* 98  --  CO2 28 27 29 27   GLUCOSE 156* 212* 111* 258*  BUN 8 10 13  12.5  CREATININE 0.67 0.76 0.78 1.1  CALCIUM 9.8 9.5 10.2 10.3  GFRNONAA >89 87 85  --   GFRAA >89 >89 >89  --   PROT 6.7  --   --  7.9  ALBUMIN 4.1  --   --  4.1  AST 22  --   --  24  ALT 19  --   --  27  ALKPHOS 126*  --   --  113  BILITOT 0.2  --   --  0.35   ASSESSMENT & PLAN:  Squamous cell carcinoma of pharynx Clinically, she has no evidence of recurrence. I would discharge her from the clinic and recommend yearly ENT followup.  DIABETES MELLITUS, TYPE II She has high blood sugar on nonfasting blood work. I would defer to her primary care provider for diabetes management.    No orders of the defined types were placed in this encounter.    All questions were answered. The patient knows to call the clinic with any problems, questions or concerns. I spent 15 minutes counseling the patient face to face. The total time spent in the appointment was 20 minutes and more than 50% was on counseling.     [place], [place], MD 04/07/2014 11:09 AM

## 2014-04-07 NOTE — Assessment & Plan Note (Signed)
She has high blood sugar on nonfasting blood work. I would defer to her primary care provider for diabetes management.

## 2014-04-18 ENCOUNTER — Encounter: Payer: Self-pay | Admitting: Internal Medicine

## 2014-04-18 ENCOUNTER — Ambulatory Visit (INDEPENDENT_AMBULATORY_CARE_PROVIDER_SITE_OTHER): Payer: Medicaid Other | Admitting: Internal Medicine

## 2014-04-18 VITALS — BP 128/89 | HR 86 | Temp 98.0°F | Wt 129.9 lb

## 2014-04-18 DIAGNOSIS — E119 Type 2 diabetes mellitus without complications: Secondary | ICD-10-CM

## 2014-04-18 DIAGNOSIS — J309 Allergic rhinitis, unspecified: Secondary | ICD-10-CM | POA: Insufficient documentation

## 2014-04-18 DIAGNOSIS — E05 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: Secondary | ICD-10-CM

## 2014-04-18 DIAGNOSIS — I10 Essential (primary) hypertension: Secondary | ICD-10-CM

## 2014-04-18 DIAGNOSIS — Z0000 Encounter for general adult medical examination without abnormal findings: Secondary | ICD-10-CM

## 2014-04-18 DIAGNOSIS — Z1231 Encounter for screening mammogram for malignant neoplasm of breast: Secondary | ICD-10-CM

## 2014-04-18 DIAGNOSIS — C140 Malignant neoplasm of pharynx, unspecified: Secondary | ICD-10-CM

## 2014-04-18 LAB — POCT GLYCOSYLATED HEMOGLOBIN (HGB A1C): HEMOGLOBIN A1C: 8.9

## 2014-04-18 LAB — GLUCOSE, CAPILLARY: Glucose-Capillary: 106 mg/dL — ABNORMAL HIGH (ref 70–99)

## 2014-04-18 MED ORDER — LORATADINE 10 MG PO TABS: 10.0000 mg | ORAL_TABLET | Freq: Every day | ORAL | Status: DC

## 2014-04-18 MED ORDER — INSULIN ASPART 100 UNIT/ML ~~LOC~~ SOLN: SUBCUTANEOUS | Status: DC

## 2014-04-18 MED ORDER — METHIMAZOLE 5 MG PO TABS: 2.5000 mg | ORAL_TABLET | Freq: Every day | ORAL | Status: DC

## 2014-04-18 NOTE — Assessment & Plan Note (Signed)
She has mammogram scheduled for next month.  Pap smear scheduled for October. She will need flu vaccine at that time.

## 2014-04-18 NOTE — Assessment & Plan Note (Signed)
Lab Results  Component Value Date   HGBA1C 8.9 04/18/2014   HGBA1C 8.3 11/15/2013   HGBA1C 8.6 06/28/2013     Assessment: Diabetes control: fair control Progress toward A1C goal:  deteriorated Comments: She is on Lantus 30 units in am 20 units in pm, Novolog 8 units with bkfst, 10 units with lunch and dinner which she had increased on hre own (from 5 u, 10 u, 8 u) 2 weeks ago due to increased BS. So far no hypoglycemia with this new regimen. She reports eating more sweets recently and her HgbA1C likely reflects this.   Plan: Medications:  continue current medications Home glucose monitoring: Frequency: 3 times a day Timing: before meals Instruction/counseling given: reminded to bring medications to each visit Educational resources provided: brochure Self management tools provided: copy of home glucose meter download Other plans: She will cut back on sweets and carbs. She was given basic carb count info. Her eye exam and foot exam are up to date. Follow up in 3 months.

## 2014-04-18 NOTE — Assessment & Plan Note (Signed)
She has had allergic rhinitis off and on with post nasal drip, she is taking OTC Claritin with some improvement of her symptoms which include cough with scant white sputum. She was advised to return to clinic if her cough and sputum worsen or if she has fever/chills.

## 2014-04-18 NOTE — Assessment & Plan Note (Signed)
Stable. Followed by Dr. Buddy Duty in Endocrinology. Taking methimazole 2.5mg  daily.

## 2014-04-18 NOTE — Assessment & Plan Note (Signed)
BP Readings from Last 3 Encounters:  04/18/14 128/89  04/07/14 155/66  01/06/14 137/83    Lab Results  Component Value Date   NA 131* 04/07/2014   K 4.0 04/07/2014   CREATININE 1.1 04/07/2014    Assessment: Blood pressure control: controlled Progress toward BP goal:  at goal Comments: She is on Norvasc 10mg  daily, lisinopril 40mg  daily, spironolactone 25mg  daily, lopressor 50mg  BID.   Plan: Medications:  continue current medications Educational resources provided: brochure Self management tools provided: home blood pressure logbook Other plans: Patient to follow up in 3 months, advised to bring all her medication bottles at that time.

## 2014-04-18 NOTE — Progress Notes (Signed)
   Subjective:    Patient ID: Susan Evans, female    DOB: Dec 30, 1955, 58 y.o.   MRN: [id number]  Diabetes Pertinent negatives for hypoglycemia include no dizziness. Pertinent negatives for diabetes include no chest pain, no fatigue and no weakness.   Susan Evans is a 58 year old woman with PMH of DM2, HTN, and Graves disease who presents for follow up visit for her diabetes and her HTN. She reports compliance with her antihypertensive. She reports that she discarded the HCTZ pills as instructed and is no longer taking this medication.  She has been eating sweets more often recently and has gained about 2 lbs. Last month her BS were in the 140s to 190s range and she decided to increase her Novolog meal coverage to 8 units with breakfast, 10 units with lunch and dinner (from 5 units with Bkfst, 10 unit with lunch, and 8 units with dinner). Since last week she has noticed an improved of her BS to mostly in the 120s-140s. She denies hypoglycemic episodes. She has brought a BS log sheet with value range consistently in the 140s for the past week.  She also complains of post-nasal drip with a cough productive of scant white mucus. She denies fever/chills, or SOB.      Review of Systems  Constitutional: Negative for fever, chills, diaphoresis, appetite change and fatigue.  HENT: Positive for postnasal drip. Negative for congestion, rhinorrhea, sinus pressure, sneezing, sore throat, trouble swallowing and voice change.   Respiratory: Positive for cough. Negative for choking, shortness of breath and wheezing.   Cardiovascular: Negative for chest pain and leg swelling.  Gastrointestinal: Negative for abdominal pain and constipation.  Genitourinary: Negative for dysuria.  Neurological: Negative for dizziness and weakness.  Psychiatric/Behavioral: Negative for agitation.       Objective:   Physical Exam  Nursing note and vitals reviewed. Constitutional: She is oriented to person, place, and  time. She appears well-developed and well-nourished. No distress.  HENT:  Mouth/Throat: Oropharynx is clear and moist. No oropharyngeal exudate.  Pale nasal turbinates bilaterally   Eyes: Conjunctivae are normal.  Cardiovascular: Normal rate and regular rhythm.   Pulmonary/Chest: Effort normal and breath sounds normal. No respiratory distress. She has no wheezes.  Abdominal: Soft. Bowel sounds are normal.  Musculoskeletal: She exhibits no edema.  Lymphadenopathy:    She has no cervical adenopathy.  Neurological: She is alert and oriented to person, place, and time.  Skin: Skin is warm and dry. She is not diaphoretic.  Psychiatric: She has a normal mood and affect. Her behavior is normal.          Assessment & Plan:

## 2014-04-18 NOTE — Patient Instructions (Signed)
General Instructions: -Start cutting back on sweets and continue taking your Novolog 8 units with breakfast, 10 units with lunch, and 10 units with dinner.  -Continue taking Claritin for your allergies. Return to the clinic if you have fever/chills, worse cough.  -Follow up in 3 months for diabetes and blood pressure recheck.   Please bring your medicines with you each time you come.   Medicines may be  Eye drops  Herbal   Vitamins  Pills  Seeing these help [place] take care of you.   Treatment Goals:  Goals (1 Years of Data) as of 04/18/14         As of Today 04/07/14 01/06/14 01/06/14 12/13/13     Blood Pressure    . Blood Pressure < 140/90  128/89 155/66 137/83 142/89 133/83     Result Component    . HEMOGLOBIN A1C < 7.0  8.9        . LDL CALC < 100            Progress Toward Treatment Goals:  Treatment Goal 04/18/2014  Hemoglobin A1C deteriorated  Blood pressure at goal    Self Care Goals & Plans:  Self Care Goal 04/18/2014  Manage my medications take my medicines as prescribed; refill my medications on time  Monitor my health keep track of my blood glucose; bring my glucose meter and log to each visit  Eat healthy foods eat baked foods instead of fried foods; eat foods that are low in salt; drink diet soda or water instead of juice or soda  Be physically active find an activity I enjoy  Meeting treatment goals -    Home Blood Glucose Monitoring 04/18/2014  Check my blood sugar 3 times a day  When to check my blood sugar before meals     Care Management & Community Referrals:  Referral 11/15/2013  Referrals made for care management support none needed  Referrals made to community resources -     Basic Carbohydrate Counting for Diabetes Mellitus Carbohydrate counting is a method for keeping track of the amount of carbohydrates you eat. Eating carbohydrates naturally increases the level of sugar (glucose) in your blood, so it is important for you to know the  amount that is okay for you to have in every meal. Carbohydrate counting helps keep the level of glucose in your blood within normal limits. The amount of carbohydrates allowed is different for every person. A dietitian can help you calculate the amount that is right for you. Once you know the amount of carbohydrates you can have, you can count the carbohydrates in the foods you want to eat. Carbohydrates are found in the following foods:  Grains, such as breads and cereals.  Dried beans and soy products.  Starchy vegetables, such as potatoes, peas, and corn.  Fruit and fruit juices.  Milk and yogurt.  Sweets and snack foods, such as cake, cookies, candy, chips, soft drinks, and fruit drinks. CARBOHYDRATE COUNTING There are two ways to count the carbohydrates in your food. You can use either of the methods or a combination of both. Reading the "Nutrition Facts" on [place] The "Nutrition Facts" is an area that is included on the labels of almost all packaged food and beverages in the [place]. It includes the serving size of that food or beverage and information about the nutrients in each serving of the food, including the grams (g) of carbohydrate per serving.  Decide the number of servings of this food  or beverage that you will be able to eat or drink. Multiply that number of servings by the number of grams of carbohydrate that is listed on the label for that serving. The total will be the amount of carbohydrates you will be having when you eat or drink this food or beverage. Learning Standard Serving Sizes of Food When you eat food that is not packaged or does not include "Nutrition Facts" on the label, you need to measure the servings in order to count the amount of carbohydrates.A serving of most carbohydrate-rich foods contains about 15 g of carbohydrates. The following list includes serving sizes of carbohydrate-rich foods that provide 15 g ofcarbohydrate per serving:   1  slice of bread (1 oz) or 1 six-inch tortilla.    of a hamburger bun or English muffin.  4-6 crackers.   cup unsweetened dry cereal.    cup hot cereal.   cup rice or pasta.    cup mashed potatoes or  of a large baked potato.  1 cup fresh fruit or one small piece of fruit.    cup canned or frozen fruit or fruit juice.  1 cup milk.   cup plain fat-free yogurt or yogurt sweetened with artificial sweeteners.   cup cooked dried beans or starchy vegetable, such as peas, corn, or potatoes.  Decide the number of standard-size servings that you will eat. Multiply that number of servings by 15 (the grams of carbohydrates in that serving). For example, if you eat 2 cups of strawberries, you will have eaten 2 servings and 30 g of carbohydrates (2 servings x 15 g = 30 g). For foods such as soups and casseroles, in which more than one food is mixed in, you will need to count the carbohydrates in each food that is included. EXAMPLE OF CARBOHYDRATE COUNTING Sample Dinner  3 oz chicken breast.   cup of brown rice.   cup of corn.  1 cup milk.   1 cup strawberries with sugar-free whipped topping.  Carbohydrate Calculation Step 1: Identify the foods that contain carbohydrates:   Rice.   Corn.   Milk.   Strawberries. Step 2:Calculate the number of servings eaten of each:   2 servings of rice.   1 serving of corn.   1 serving of milk.   1 serving of strawberries. Step 3: Multiply each of those number of servings by 15 g:   2 servings of rice x 15 g = 30 g.   1 serving of corn x 15 g = 15 g.   1 serving of milk x 15 g = 15 g.   1 serving of strawberries x 15 g = 15 g. Step 4: Add together all of the amounts to find the total grams of carbohydrates eaten: 30 g + 15 g + 15 g + 15 g = 75 g. Document Released: 09/09/2005 Document Revised: 01/24/2014 Document Reviewed: 08/06/2013 [facility] Patient Information 2015 [place], [place]. This information is  not intended to replace advice given to you by your health care provider. Make sure you discuss any questions you have with your health care provider.

## 2014-04-20 NOTE — Progress Notes (Signed)
Case discussed with Dr. Kennerly soon after the resident saw the patient.  We reviewed the resident's history and exam and pertinent patient test results.  I agree with the assessment, diagnosis, and plan of care documented in the resident's note. 

## 2014-05-12 ENCOUNTER — Ambulatory Visit (HOSPITAL_COMMUNITY): Payer: Medicaid Other

## 2014-05-19 ENCOUNTER — Ambulatory Visit (HOSPITAL_COMMUNITY)
Admission: RE | Admit: 2014-05-19 | Discharge: 2014-05-19 | Disposition: A | Discharge status: Home / Self Care | Payer: Medicaid Other | Source: Ambulatory Visit | Attending: Internal Medicine | Admitting: Internal Medicine

## 2014-05-19 DIAGNOSIS — Z1231 Encounter for screening mammogram for malignant neoplasm of breast: Secondary | ICD-10-CM | POA: Diagnosis present

## 2014-06-08 ENCOUNTER — Other Ambulatory Visit: Payer: Self-pay | Admitting: Internal Medicine

## 2014-06-21 ENCOUNTER — Encounter: Payer: Self-pay | Admitting: *Deleted

## 2014-07-22 ENCOUNTER — Other Ambulatory Visit: Payer: Self-pay | Admitting: Internal Medicine

## 2014-07-23 ENCOUNTER — Other Ambulatory Visit: Payer: Self-pay | Admitting: Internal Medicine

## 2014-07-29 ENCOUNTER — Other Ambulatory Visit: Payer: Self-pay | Admitting: Internal Medicine

## 2014-08-01 ENCOUNTER — Encounter (INDEPENDENT_AMBULATORY_CARE_PROVIDER_SITE_OTHER): Payer: Medicaid Other | Admitting: Internal Medicine

## 2014-08-15 ENCOUNTER — Encounter: Payer: Self-pay | Admitting: Internal Medicine

## 2014-08-15 ENCOUNTER — Ambulatory Visit (INDEPENDENT_AMBULATORY_CARE_PROVIDER_SITE_OTHER): Payer: Medicaid Other | Admitting: Internal Medicine

## 2014-08-15 VITALS — BP 141/74 | HR 78 | Temp 98.2°F | Wt 130.7 lb

## 2014-08-15 DIAGNOSIS — Z0000 Encounter for general adult medical examination without abnormal findings: Secondary | ICD-10-CM

## 2014-08-15 DIAGNOSIS — I10 Essential (primary) hypertension: Secondary | ICD-10-CM

## 2014-08-15 DIAGNOSIS — E119 Type 2 diabetes mellitus without complications: Secondary | ICD-10-CM

## 2014-08-15 DIAGNOSIS — Z794 Long term (current) use of insulin: Secondary | ICD-10-CM

## 2014-08-15 DIAGNOSIS — E05 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: Secondary | ICD-10-CM

## 2014-08-15 LAB — LIPID PANEL
Cholesterol: 153 mg/dL (ref 0–200)
HDL: 34 mg/dL — ABNORMAL LOW (ref >39)
LDL CALC: 73 mg/dL (ref 0–99)
Total CHOL/HDL Ratio: 4.5 Ratio
Triglycerides: 228 mg/dL — ABNORMAL HIGH (ref <150)
VLDL: 46 mg/dL — ABNORMAL HIGH (ref 0–40)

## 2014-08-15 LAB — POCT GLYCOSYLATED HEMOGLOBIN (HGB A1C): Hemoglobin A1C: 8.2

## 2014-08-15 LAB — GLUCOSE, CAPILLARY: Glucose-Capillary: 154 mg/dL — ABNORMAL HIGH (ref 70–99)

## 2014-08-15 MED ORDER — METHIMAZOLE 5 MG PO TABS: 2.5000 mg | ORAL_TABLET | ORAL | Status: DC

## 2014-08-15 NOTE — Assessment & Plan Note (Addendum)
She had flu and prevnar 13 at her Endocrinologist's office earlier this month--will obtain records.  She will call and make an appointment for Pap smear.

## 2014-08-15 NOTE — Assessment & Plan Note (Signed)
Followed by Dr. Buddy Duty in Endocrinology with last visit earlier in November.  Back on methimazole 2.5mg  every other day, has lab follow up appt in Dec 18th.  Had palpitation but these are improvement now that she is back on methimazole.

## 2014-08-15 NOTE — Assessment & Plan Note (Signed)
Lab Results  Component Value Date   HGBA1C 8.2 08/15/2014   HGBA1C 8.9 04/18/2014   HGBA1C 8.3 11/15/2013     Assessment: Diabetes control:   Improved Progress toward A1C goal:   Not at goal Comments: She has cut back on sweet but is not sure she understands carb count well. She has been using Novolog 5-6 units with breakfast, 10 units with lunch (but sometimes 1 hr after lunch), and 8 units with dinner. She is on lantus 30 units in the am (9am) and 20 units at bedtime. I reviewed her log book with reads of 111-163 before breakfast (mostly in the 140 mg/dL range), 109-199 before lunch, and 121-198 before dinner. She has no hypoglycemia. Usually symptomatic below 70mg /dL.   Plan: Medications:  continue current medications, increase bedtime Lantus to 24 units, Novolog at lunch to stay at 10 units but 10-15 minutes before her meal Home glucose monitoring: Frequency:  3 times per day Timing:  before meals Instruction/counseling given: reminded to bring medications to each visit, discussed foot care and discussed diet Educational resources provided:   Self management tools provided:   Other plans: Referred to diabetes educator for carb counting. Checked lipid panel (on Zocor already)

## 2014-08-15 NOTE — Progress Notes (Signed)
Medicine attending: Medical history, presenting problems, physical findings, and medications, reviewed with resident physician Dr. Hayes Ludwig and I concur with her evaluation and management plan.

## 2014-08-15 NOTE — Patient Instructions (Signed)
General Instructions: -Increase your Lantus at bedtime to 24 units if your blood sugar drops below 80 mg/dL before breakfast, decrease the Lantus at bedtime to 22 units.  -Continue the Novolog 5-6 units with breakfast, 10 units with lunch, and 8 units with dinner. You may given yourself the lunch time insulin 10-15 minutes before your lunch.  -Please schedule an appointment with Debera Lat for carbohydrate count teaching.  -Please call us and schedule an appointment for a PAP smear during the month of December.  -Make a follow up appointment for 3 months.   -Have a wonderful Thanksgiving!    Please bring your medicines with you each time you come to clinic.  Medicines may include prescription medications, over-the-counter medications, herbal remedies, eye drops, vitamins, or other pills.   Progress Toward Treatment Goals:  Treatment Goal 04/18/2014  Hemoglobin A1C deteriorated  Blood pressure at goal    Self Care Goals & Plans:  Self Care Goal 04/18/2014  Manage my medications take my medicines as prescribed; refill my medications on time  Monitor my health keep track of my blood glucose; bring my glucose meter and log to each visit  Eat healthy foods eat baked foods instead of fried foods; eat foods that are low in salt; drink diet soda or water instead of juice or soda  Be physically active find an activity I enjoy  Meeting treatment goals -    Home Blood Glucose Monitoring 04/18/2014  Check my blood sugar 3 times a day  When to check my blood sugar before meals     Care Management & Community Referrals:  Referral 11/15/2013  Referrals made for care management support none needed  Referrals made to community resources -

## 2014-08-15 NOTE — Assessment & Plan Note (Signed)
BP Readings from Last 3 Encounters:  08/15/14 141/74  04/18/14 128/89  04/07/14 155/66    Lab Results  Component Value Date   NA 131* 04/07/2014   K 4.0 04/07/2014   CREATININE 1.1 04/07/2014    Assessment: Blood pressure control:  slightly elevated Progress toward BP goal:   at goal Comments: on lisinopril 40mg , lopressor 50mg  BID, Norvasc 10mg  daily, spironolactone 25 mg daily.   Plan: Medications:  continue current medications Educational resources provided:   Self management tools provided:   Other plans: Follow up in 3 months.

## 2014-08-15 NOTE — Progress Notes (Signed)
   Subjective:    Patient ID: Gretel Acre, female    DOB: 02/11/1956, 58 y.o.   MRN: [id number]  HPI Ms. Picker is a 58 yr old woman with PMH of Graves disease, DM2, HTN, who presents for follow up visit.    Review of Systems  Constitutional: Negative for fever, chills, diaphoresis, activity change, appetite change and fatigue.  HENT:       Chronic hoarseness   Respiratory: Positive for cough. Negative for shortness of breath.        Dry cough that is improving  Cardiovascular: Positive for palpitations. Negative for chest pain and leg swelling.  Gastrointestinal: Negative for abdominal pain and diarrhea.  Genitourinary: Negative for dysuria.  Musculoskeletal: Negative for arthralgias.  Skin: Negative for rash.  Neurological: Negative for dizziness and light-headedness.  Psychiatric/Behavioral: Negative for agitation.       Objective:   Physical Exam  Constitutional: She is oriented to person, place, and time. She appears well-developed and well-nourished. No distress.  HENT:  Hoarseness   Eyes: Conjunctivae are normal.  No proptosis or lid lag  Cardiovascular: Normal rate and regular rhythm.   No murmur heard. Pulmonary/Chest: Effort normal and breath sounds normal. No respiratory distress. She has no wheezes. She has no rales.  Abdominal: Soft.  Musculoskeletal: She exhibits no edema or tenderness.  Neurological: She is alert and oriented to person, place, and time. Coordination normal.  No tremor  Skin: Skin is warm and dry. She is not diaphoretic.  Psychiatric: She has a normal mood and affect.  Nursing note and vitals reviewed.         Assessment & Plan:

## 2014-08-17 ENCOUNTER — Other Ambulatory Visit: Payer: Self-pay | Admitting: Internal Medicine

## 2014-09-06 ENCOUNTER — Other Ambulatory Visit: Payer: Self-pay | Admitting: Internal Medicine

## 2014-09-07 ENCOUNTER — Telehealth: Payer: Self-pay | Admitting: *Deleted

## 2014-09-07 NOTE — Telephone Encounter (Signed)
Pt walks into clinic on 09/01/14 stating she was trying to obtain life insurance and needed letter faxed stating she did not had CHF.  I requested number for insurance rep in order to obtain more info and pt had to call me back with info. I had pt sign a release of information and informed her I will send info to pcp once I spoke with the insurance rep.    I spoke with insurance rep today, Rosezena Sensor [phone]) and he stated something showed up in the database that the medications pt was taking (spironolactone, lisinopril, and metoprolol) was consistent for a pt with heart failure and they are now requiring that our office write a letter stating she does not have a dx of heart failure.  Letter will need to be faxed to [phone] once completed.  Please advise.Despina Hidden Cassady12/16/20153:20 PM

## 2014-09-09 NOTE — Telephone Encounter (Signed)
Will have the letter on Monday 12/21. Thanks! SK

## 2014-09-12 ENCOUNTER — Encounter: Payer: Self-pay | Admitting: Internal Medicine

## 2014-09-12 NOTE — Telephone Encounter (Signed)
Letter printed. Thanks Ulis Rias for faxing this over to her ins co.   SK

## 2014-09-19 ENCOUNTER — Encounter: Payer: Self-pay | Admitting: Internal Medicine

## 2014-09-19 NOTE — Telephone Encounter (Signed)
Call from [facility] is asking if pt has "ever been diagnosed with heart failure".  Insurance also needs to know what pt takes these medications for.  Discussed with DrKennerly-new letter created, will fax to insurance company. Pt aware.Despina Hidden Cassady12/28/20153:47 PM

## 2014-10-24 ENCOUNTER — Other Ambulatory Visit: Payer: Self-pay | Admitting: Internal Medicine

## 2014-11-15 ENCOUNTER — Encounter: Payer: Medicaid Other | Admitting: Internal Medicine

## 2014-12-19 ENCOUNTER — Encounter: Payer: Self-pay | Admitting: Internal Medicine

## 2014-12-19 ENCOUNTER — Encounter: Payer: Medicaid Other | Admitting: Internal Medicine

## 2015-01-23 ENCOUNTER — Other Ambulatory Visit: Payer: Self-pay | Admitting: Internal Medicine

## 2015-02-03 ENCOUNTER — Encounter: Payer: Self-pay | Admitting: *Deleted

## 2015-02-26 ENCOUNTER — Other Ambulatory Visit: Payer: Self-pay | Admitting: Internal Medicine

## 2015-02-27 ENCOUNTER — Encounter: Payer: Medicaid Other | Admitting: Internal Medicine

## 2015-02-27 NOTE — Telephone Encounter (Signed)
Needs BMET for potassium and creatinine check since she is on spironolactone and lisinopril.

## 2015-03-22 ENCOUNTER — Other Ambulatory Visit: Payer: Self-pay | Admitting: Internal Medicine

## 2015-04-11 ENCOUNTER — Other Ambulatory Visit: Payer: Self-pay | Admitting: Internal Medicine

## 2015-04-14 NOTE — Telephone Encounter (Signed)
Requesting medication refill from [facility] on [address].

## 2015-04-20 ENCOUNTER — Encounter: Payer: Self-pay | Admitting: Internal Medicine

## 2015-04-20 ENCOUNTER — Ambulatory Visit (INDEPENDENT_AMBULATORY_CARE_PROVIDER_SITE_OTHER): Payer: Medicaid Other | Admitting: Internal Medicine

## 2015-04-20 VITALS — BP 162/67 | HR 77 | Temp 98.0°F | Ht 62.5 in | Wt 138.5 lb

## 2015-04-20 DIAGNOSIS — I10 Essential (primary) hypertension: Secondary | ICD-10-CM

## 2015-04-20 DIAGNOSIS — E05 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: Secondary | ICD-10-CM

## 2015-04-20 DIAGNOSIS — E1165 Type 2 diabetes mellitus with hyperglycemia: Secondary | ICD-10-CM

## 2015-04-20 DIAGNOSIS — Z794 Long term (current) use of insulin: Secondary | ICD-10-CM | POA: Diagnosis not present

## 2015-04-20 DIAGNOSIS — E119 Type 2 diabetes mellitus without complications: Secondary | ICD-10-CM

## 2015-04-20 LAB — POCT GLYCOSYLATED HEMOGLOBIN (HGB A1C): Hemoglobin A1C: 9.4

## 2015-04-20 LAB — GLUCOSE, CAPILLARY: GLUCOSE-CAPILLARY: 178 mg/dL — AB (ref 65–99)

## 2015-04-20 MED ORDER — INSULIN GLARGINE 100 UNIT/ML ~~LOC~~ SOLN: SUBCUTANEOUS | Status: DC

## 2015-04-20 MED ORDER — METHIMAZOLE 5 MG PO TABS: 2.5000 mg | ORAL_TABLET | ORAL | Status: DC

## 2015-04-20 MED ORDER — INSULIN ASPART 100 UNIT/ML ~~LOC~~ SOLN: SUBCUTANEOUS | Status: DC

## 2015-04-20 MED ORDER — SIMVASTATIN 20 MG PO TABS: ORAL_TABLET | ORAL | Status: DC

## 2015-04-20 NOTE — Progress Notes (Signed)
   Subjective:    Patient ID: Susan Evans, female    DOB: May 22, 1956, 59 y.o.   MRN: [id number]  HPI Susan Evans is a 59 y.o. 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 Please see problem-based charting for further pertinent information.    Review of Systems  Constitutional: Negative for fever, chills and diaphoresis.  HENT: Positive for congestion, sore throat and voice change. Negative for ear pain and sinus pressure.   Eyes: Negative for visual disturbance.  Respiratory: Negative for cough, shortness of breath and wheezing.   Cardiovascular: Negative for chest pain, palpitations and leg swelling.  Gastrointestinal: Negative for nausea, vomiting, abdominal pain, diarrhea, constipation and blood in stool.  Endocrine: Negative for polyuria.  Genitourinary: Negative for dysuria, urgency, frequency, flank pain, decreased urine volume and difficulty urinating.  Musculoskeletal: Negative for back pain and gait problem.  Skin: Negative for rash.  Neurological: Negative for dizziness, syncope, weakness, numbness and headaches.  Hematological: Does not bruise/bleed easily.  Psychiatric/Behavioral: Negative for confusion and agitation.  All other systems reviewed and are negative.      Objective:   Physical Exam  Constitutional: She is oriented to person, place, and time. She appears well-nourished. No distress.  HENT:  Head: Normocephalic and atraumatic.  Right Ear: External ear normal.  Left Ear: External ear normal.  Eyes: Conjunctivae are normal. Pupils are equal, round, and reactive to light.  Neck: Normal range of motion. Neck supple.  Cardiovascular: Normal rate, regular rhythm, normal heart sounds and intact distal pulses.  Exam reveals no gallop and no friction rub.   No murmur heard. Pulmonary/Chest: Effort normal and breath sounds normal. She has no wheezes. She  has no rales.  Abdominal: Soft. Bowel sounds are normal. She exhibits no distension. There is no tenderness.  Musculoskeletal: Normal range of motion. She exhibits no edema or tenderness.  Neurological: She is alert and oriented to person, place, and time. She has normal reflexes. No cranial nerve deficit. Coordination normal.  Skin: Skin is warm. No rash noted. She is not diaphoretic. No erythema.  Psychiatric: She has a normal mood and affect. Her behavior is normal.  Nursing note and vitals reviewed.         Assessment & Plan:  Please see problem-based charting for assessment and plan.  Blane Ohara, MD Resident Physician, PGY-1 Department of Internal Medicine [facility]

## 2015-04-20 NOTE — Assessment & Plan Note (Signed)
.   Lab Results  Component Value Date   HGBA1C 9.4 04/20/2015   HGBA1C 8.2 08/15/2014   HGBA1C 8.9 04/18/2014     Assessment: Diabetes control:  Diminshed Progress toward A1C goal:   Not at goal Comments: She continues to struggle with diet and was unable to make an apponitment with diabetes education for carb counting and demonstrates poor understanding of carb counting. She has been using Novolog 6-7 at breakfast, 10 at lunch, and 8 with dinner, all before meals. She is on 30u lantus in AM and 24 units at bedtime. A1c is 9.4 today. No episodes of hypoglycemia, continues to run high in the morning, in the 140-150 range.  Plan: Medications:  Continue current medications, increase bedtime Lantus to 30 Units, keep morning at 30 units. Home glucose monitoring: Frequency:  3 times/day Timing:  before meals Instruction/counseling given: reminded to get eye exam, reminded to bring medications to each visit, discussed foot care and discussed diet Educational resources provided: brochure Self management tools provided: home glucose logbook Other plans: Once again referred to diabetes educator for carb counting. Will follow-up in 3 months.

## 2015-04-20 NOTE — Assessment & Plan Note (Signed)
Continues to do well with no recurrent symptoms on methimazole 2.5mg  every other day, follows up with endocrinology next month.

## 2015-04-20 NOTE — Assessment & Plan Note (Signed)
BP Readings from Last 3 Encounters:  04/20/15 162/67  08/15/14 141/74  04/18/14 128/89    Lab Results  Component Value Date   NA 131* 04/07/2014   K 4.0 04/07/2014   CREATININE 1.1 04/07/2014    Assessment: Blood pressure control:  slightly elevated Progress toward BP goal:   near goal Comments: on lisinopril 40qd, lopressor 50bid, norvasc 10mg  qd, spironolactone 25qd  Plan: Medications:  continue current medications Educational resources provided: brochure Self management tools provided: home blood pressure logbook Other plans: Follow-up in 3 months

## 2015-04-20 NOTE — Patient Instructions (Signed)
[name],  Thank you for coming in today for your visit. Here are the main take-away points from our visit today: -Your A1c today was 9.4, which is a little higher than our goal, so we want to increase your Lantus before bedtime to 30 units. Now, your morning Lantus should be 30 units and your evening Lantus should also be 30 units. -Keep checking your sugars. You're doing a great job! -Make an appointment with Butch Penny for diabetes education. -Make sure to get your eyes checked soon!  Please come back in 3 months so we can see how you're doing!  Thanks, Blane Ohara

## 2015-04-21 NOTE — Progress Notes (Signed)
Medicine attending: I personally interviewed and briefly examined this patient, and reviewed pertinent clinical laboratory and radiographic data  with resident physician Dr.William Merrilyn Puma and we discussed a   management plan.

## 2015-04-26 ENCOUNTER — Other Ambulatory Visit: Payer: Self-pay | Admitting: Internal Medicine

## 2015-04-26 DIAGNOSIS — Z1231 Encounter for screening mammogram for malignant neoplasm of breast: Secondary | ICD-10-CM

## 2015-05-12 ENCOUNTER — Other Ambulatory Visit: Payer: Self-pay | Admitting: Internal Medicine

## 2015-05-12 DIAGNOSIS — IMO0001 Reserved for inherently not codable concepts without codable children: Secondary | ICD-10-CM

## 2015-05-22 ENCOUNTER — Ambulatory Visit (HOSPITAL_COMMUNITY)
Admission: RE | Admit: 2015-05-22 | Discharge: 2015-05-22 | Disposition: A | Discharge status: Home / Self Care | Payer: Medicaid Other | Source: Ambulatory Visit | Attending: Internal Medicine | Admitting: Internal Medicine

## 2015-05-22 DIAGNOSIS — Z1231 Encounter for screening mammogram for malignant neoplasm of breast: Secondary | ICD-10-CM | POA: Insufficient documentation

## 2015-05-26 ENCOUNTER — Other Ambulatory Visit: Payer: Self-pay | Admitting: Internal Medicine

## 2015-05-31 ENCOUNTER — Other Ambulatory Visit: Payer: Self-pay | Admitting: Internal Medicine

## 2015-05-31 MED ORDER — INSULIN GLARGINE 100 UNIT/ML ~~LOC~~ SOLN: SUBCUTANEOUS | Status: DC

## 2015-05-31 NOTE — Telephone Encounter (Signed)
These have been filled and sent to pharm, called pt and scheduled appt in nov as ask by dr Lynnae January, 11/3 at 1315 dr Merrilyn Puma

## 2015-05-31 NOTE — Telephone Encounter (Signed)
Pt called requesting lantus and simvastatin to be filled.

## 2015-05-31 NOTE — Telephone Encounter (Signed)
Needs Oct / Nov appt PCP

## 2015-05-31 NOTE — Telephone Encounter (Signed)
Message sent to front desk pool regarding appt per Dr Lynnae January.

## 2015-06-05 ENCOUNTER — Encounter: Payer: Medicaid Other | Admitting: Dietician

## 2015-06-18 ENCOUNTER — Other Ambulatory Visit: Payer: Self-pay | Admitting: Internal Medicine

## 2015-06-28 ENCOUNTER — Other Ambulatory Visit: Payer: Self-pay | Admitting: Internal Medicine

## 2015-06-28 NOTE — Telephone Encounter (Signed)
Pt has refills on this med. Pharmacy to call Dr Buddy Duty for refill on Methimazole

## 2015-06-28 NOTE — Telephone Encounter (Signed)
Pt called requesting [facility]

## 2015-07-05 NOTE — Addendum Note (Signed)
Addended by: Hulan Fray on: 07/05/2015 07:29 PM   Modules accepted: Orders

## 2015-07-27 ENCOUNTER — Ambulatory Visit (INDEPENDENT_AMBULATORY_CARE_PROVIDER_SITE_OTHER): Payer: Medicaid Other | Admitting: Internal Medicine

## 2015-07-27 ENCOUNTER — Encounter: Payer: Self-pay | Admitting: Internal Medicine

## 2015-07-27 VITALS — BP 165/64 | HR 89 | Temp 98.4°F | Ht 65.5 in | Wt 135.1 lb

## 2015-07-27 DIAGNOSIS — Z23 Encounter for immunization: Secondary | ICD-10-CM | POA: Diagnosis not present

## 2015-07-27 DIAGNOSIS — E1011 Type 1 diabetes mellitus with ketoacidosis with coma: Secondary | ICD-10-CM

## 2015-07-27 DIAGNOSIS — R809 Proteinuria, unspecified: Secondary | ICD-10-CM | POA: Diagnosis not present

## 2015-07-27 DIAGNOSIS — Z794 Long term (current) use of insulin: Secondary | ICD-10-CM | POA: Diagnosis not present

## 2015-07-27 DIAGNOSIS — E1129 Type 2 diabetes mellitus with other diabetic kidney complication: Secondary | ICD-10-CM

## 2015-07-27 DIAGNOSIS — E1165 Type 2 diabetes mellitus with hyperglycemia: Secondary | ICD-10-CM

## 2015-07-27 DIAGNOSIS — I10 Essential (primary) hypertension: Secondary | ICD-10-CM | POA: Diagnosis not present

## 2015-07-27 DIAGNOSIS — E1121 Type 2 diabetes mellitus with diabetic nephropathy: Secondary | ICD-10-CM

## 2015-07-27 DIAGNOSIS — IMO0001 Reserved for inherently not codable concepts without codable children: Secondary | ICD-10-CM

## 2015-07-27 LAB — GLUCOSE, CAPILLARY: Glucose-Capillary: 57 mg/dL — ABNORMAL LOW (ref 65–99)

## 2015-07-27 LAB — POCT GLYCOSYLATED HEMOGLOBIN (HGB A1C): Hemoglobin A1C: 8.8

## 2015-07-27 NOTE — Progress Notes (Signed)
Medicine attending: I personally interviewed and briefly examined this patient, and reviewed pertinent clinical laboratory  data  with resident physician Dr.William Merrilyn Puma and we discussed a  management plan.

## 2015-07-27 NOTE — Assessment & Plan Note (Signed)
BP Readings from Last 3 Encounters:  07/27/15 165/64  04/20/15 162/67  08/15/14 141/74    Lab Results  Component Value Date   NA 131* 04/07/2014   K 4.0 04/07/2014   CREATININE 1.1 04/07/2014    Assessment: Blood pressure control:  near goal Progress toward BP goal:   unchanged Comments: On amlodipine 10mg , lisinopril 40mg , metoprolol 50mg , and spironolactone 25mg   Plan: Medications:  continue current medications Educational resources provided:   Self management tools provided:   Other plans: Continue to monitor - will consider increasing meds as needed but already on extensive hypertensive regimen - reassess compliance at next visit as well

## 2015-07-27 NOTE — Assessment & Plan Note (Signed)
Lab Results  Component Value Date   HGBA1C 8.8 07/27/2015   HGBA1C 9.4 04/20/2015   HGBA1C 8.2 08/15/2014     Assessment: Diabetes control:  poorly-controlled Progress toward A1C goal:   improving Comments: On lantus 30U am and 30U pm, as well as Novolog 6-7 breakfast, 10 lunch, 8 dinner. A1c 8.8 today. Has had 2-3 episodes of glucoses into 50s when she forgets to eat, including at visit today. She says she gets 'jittery' during these episodes which resolve spontaneously.  Plan: Medications:  continue current medications Home glucose monitoring: Frequency:  Every morning Timing:  ~6:30am QD Instruction/counseling given: reminded to get eye exam, reminded to bring blood glucose meter & log to each visit, reminded to bring medications to each visit and discussed diet Educational resources provided:   Self management tools provided: home glucose logbook Other plans: She is improving her glycemic control, with AM sugars in the 140-180 range. However, she has had 2-3 episodes of hypoglycemia into the 50s. She has been exercising more and eating less carbs - will continue to monitor and titrate down as indicated

## 2015-07-27 NOTE — Progress Notes (Signed)
   Subjective:    Patient ID: Gretel Acre, female    DOB: [birth year], 59 y.o.   MRN: [id number]  HPI Ms.Azucena Stepanian is a 59 y.o. with PMH of Grave's on MMI, oropharynx cancer s/p TORS and adjuvant chemo-RT in 2001, DM2 and HTN who presents to [facility] today for routine follow-up. Please see problem-based charting for further pertinent information.    Review of Systems  Constitutional: Negative for fever, chills and diaphoresis.  HENT: Negative for ear pain, sinus pressure and sore throat.   Eyes: Negative for visual disturbance.  Respiratory: Negative for cough, shortness of breath and wheezing.   Cardiovascular: Negative for chest pain, palpitations and leg swelling.  Gastrointestinal: Negative for nausea, vomiting, abdominal pain, diarrhea, constipation and blood in stool.  Endocrine: Negative for polyuria.  Genitourinary: Negative for dysuria, urgency, frequency, flank pain, decreased urine volume and difficulty urinating.  Musculoskeletal: Negative for back pain and gait problem.  Skin: Negative for rash.  Neurological: Negative for dizziness, syncope, weakness, numbness and headaches.  Hematological: Does not bruise/bleed easily.  Psychiatric/Behavioral: Negative for confusion and agitation.  All other systems reviewed and are negative.      Objective:   Physical Exam  Constitutional: She is oriented to person, place, and time. She appears well-nourished. No distress.  HENT:  Head: Normocephalic and atraumatic.  Right Ear: External ear normal.  Left Ear: External ear normal.  Eyes: Conjunctivae are normal. Pupils are equal, round, and reactive to light.  Neck: Normal range of motion. Neck supple.  Cardiovascular: Normal rate, regular rhythm, normal heart sounds and intact distal pulses.  Exam reveals no gallop and no friction rub.   No murmur heard. Pulmonary/Chest: Effort normal and breath sounds normal. She has no wheezes. She has no rales.  Abdominal: Soft. Bowel  sounds are normal. She exhibits no distension. There is no tenderness.  Musculoskeletal: Normal range of motion. She exhibits no edema or tenderness.  Neurological: She is alert and oriented to person, place, and time. She has normal reflexes. No cranial nerve deficit. Coordination normal.  Skin: Skin is warm. No rash noted. She is not diaphoretic. No erythema.  Psychiatric: She has a normal mood and affect. Her behavior is normal.  Nursing note and vitals reviewed.         Assessment & Plan:  Please see problem-based charting for assessment and plan.  Blane Ohara, MD Resident Physician, PGY-1 Department of Internal Medicine [facility]

## 2015-09-20 ENCOUNTER — Other Ambulatory Visit: Payer: Self-pay | Admitting: Internal Medicine

## 2015-09-27 NOTE — Telephone Encounter (Signed)
Updated dose based on Nov note

## 2015-10-14 ENCOUNTER — Emergency Department (HOSPITAL_COMMUNITY)
Admission: EM | Admit: 2015-10-14 | Discharge: 2015-10-14 | Disposition: A | Discharge status: Home / Self Care | Payer: Medicaid Other | Attending: Emergency Medicine | Admitting: Emergency Medicine

## 2015-10-14 ENCOUNTER — Encounter (HOSPITAL_COMMUNITY): Payer: Self-pay

## 2015-10-14 DIAGNOSIS — I10 Essential (primary) hypertension: Secondary | ICD-10-CM | POA: Diagnosis not present

## 2015-10-14 DIAGNOSIS — Z862 Personal history of diseases of the blood and blood-forming organs and certain disorders involving the immune mechanism: Secondary | ICD-10-CM | POA: Insufficient documentation

## 2015-10-14 DIAGNOSIS — Z880 Allergy status to penicillin: Secondary | ICD-10-CM | POA: Insufficient documentation

## 2015-10-14 DIAGNOSIS — K219 Gastro-esophageal reflux disease without esophagitis: Secondary | ICD-10-CM | POA: Diagnosis not present

## 2015-10-14 DIAGNOSIS — E785 Hyperlipidemia, unspecified: Secondary | ICD-10-CM | POA: Diagnosis not present

## 2015-10-14 DIAGNOSIS — E0590 Thyrotoxicosis, unspecified without thyrotoxic crisis or storm: Secondary | ICD-10-CM | POA: Diagnosis not present

## 2015-10-14 DIAGNOSIS — Z8739 Personal history of other diseases of the musculoskeletal system and connective tissue: Secondary | ICD-10-CM | POA: Insufficient documentation

## 2015-10-14 DIAGNOSIS — Z794 Long term (current) use of insulin: Secondary | ICD-10-CM | POA: Diagnosis not present

## 2015-10-14 DIAGNOSIS — Z85818 Personal history of malignant neoplasm of other sites of lip, oral cavity, and pharynx: Secondary | ICD-10-CM | POA: Diagnosis not present

## 2015-10-14 DIAGNOSIS — R000 Tachycardia, unspecified: Secondary | ICD-10-CM | POA: Diagnosis present

## 2015-10-14 DIAGNOSIS — Z79899 Other long term (current) drug therapy: Secondary | ICD-10-CM | POA: Insufficient documentation

## 2015-10-14 DIAGNOSIS — Z7982 Long term (current) use of aspirin: Secondary | ICD-10-CM | POA: Insufficient documentation

## 2015-10-14 DIAGNOSIS — R002 Palpitations: Secondary | ICD-10-CM | POA: Diagnosis not present

## 2015-10-14 DIAGNOSIS — Z87891 Personal history of nicotine dependence: Secondary | ICD-10-CM | POA: Insufficient documentation

## 2015-10-14 DIAGNOSIS — Z8742 Personal history of other diseases of the female genital tract: Secondary | ICD-10-CM | POA: Diagnosis not present

## 2015-10-14 DIAGNOSIS — E119 Type 2 diabetes mellitus without complications: Secondary | ICD-10-CM | POA: Insufficient documentation

## 2015-10-14 LAB — BASIC METABOLIC PANEL
ANION GAP: 9 (ref 5–15)
BUN: 14 mg/dL (ref 6–20)
CO2: 28 mmol/L (ref 22–32)
Calcium: 10.1 mg/dL (ref 8.9–10.3)
Chloride: 102 mmol/L (ref 101–111)
Creatinine, Ser: 1.24 mg/dL — ABNORMAL HIGH (ref 0.44–1.00)
GFR calc Af Amer: 54 mL/min — ABNORMAL LOW (ref >60)
GFR calc non Af Amer: 47 mL/min — ABNORMAL LOW (ref >60)
GLUCOSE: 253 mg/dL — AB (ref 65–99)
POTASSIUM: 4.4 mmol/L (ref 3.5–5.1)
Sodium: 139 mmol/L (ref 135–145)

## 2015-10-14 LAB — CBC
HEMATOCRIT: 38.6 % (ref 36.0–46.0)
HEMOGLOBIN: 11.9 g/dL — AB (ref 12.0–15.0)
MCH: 23.8 pg — AB (ref 26.0–34.0)
MCHC: 30.8 g/dL (ref 30.0–36.0)
MCV: 77.4 fL — ABNORMAL LOW (ref 78.0–100.0)
Platelets: 224 10*3/uL (ref 150–400)
RBC: 4.99 MIL/uL (ref 3.87–5.11)
RDW: 14.2 % (ref 11.5–15.5)
WBC: 7.5 10*3/uL (ref 4.0–10.5)

## 2015-10-14 LAB — DIFFERENTIAL
Basophils Absolute: 0 10*3/uL (ref 0.0–0.1)
Basophils Relative: 0 %
EOS PCT: 2 %
Eosinophils Absolute: 0.1 10*3/uL (ref 0.0–0.7)
LYMPHS ABS: 2.9 10*3/uL (ref 0.7–4.0)
LYMPHS PCT: 36 %
MONO ABS: 0.6 10*3/uL (ref 0.1–1.0)
MONOS PCT: 7 %
Neutro Abs: 4.4 10*3/uL (ref 1.7–7.7)
Neutrophils Relative %: 55 %

## 2015-10-14 LAB — TROPONIN I: Troponin I: <0.03 ng/mL (ref <0.031)

## 2015-10-14 NOTE — ED Notes (Signed)
Bed: [id number] Expected date:  Expected time:  Means of arrival:  Comments: EMS 88F tachycardia

## 2015-10-14 NOTE — ED Notes (Signed)
EKG given to EDP,Glick,MD., for review. 

## 2015-10-14 NOTE — ED Provider Notes (Signed)
CSN: [id number]     Arrival date & time 10/14/15  0505 History   First MD Initiated Contact with Patient 10/14/15 [phone]     Chief Complaint  Patient presents with  . Tachycardia     (Consider location/radiation/quality/duration/timing/severity/associated sxs/prior Treatment) The history is provided by the patient.  60 -year-old female has been having episodes of feeling a rapid heartbeat in the left side of her neck over the last week. Episodes will last up to 15 minutes before resolving. Tonight, she had an episode that woke her up. There is no associated chest pain, heaviness, tightness, pressure. She denies dyspnea, nausea, diaphoresis. Of note, she had seen her thyroid Dr. 2 days ago and had some blood tests drawn but she does not know what tests and she does not know the results. She has not had many problems like this before. She is currently symptom-free.  Past Medical History  Diagnosis Date  . Diabetes mellitus   . Hyperlipidemia   . Hypertension   . GERD (gastroesophageal reflux disease)   . Tobacco abuse     PT HAS QUIT  . Iron deficiency anemia   . Menopause   . Ganglion cyst     right wrist  . Malignant neoplasm ([place]) 04/11    oropharyngeal carcinoma  . Thyroid disease     Hyperthyroidism  . History of radiation therapy 02/27/10 -04/13/10    left base of tongue, bilat neck   Past Surgical History  Procedure Laterality Date  . Neck surgery     Family History  Problem Relation Age of Onset  . Stroke Father   . Cancer Father     unknown ca  . Cancer Paternal Aunt     colon ca   Social History  Substance Use Topics  . Smoking status: Former Smoker -- 0.00 packs/day    Quit date: 02/09/2009  . Smokeless tobacco: Never Used  . Alcohol Use: No   OB History    No data available     Review of Systems  All other systems reviewed and are negative.     Allergies  Hydrochlorothiazide and Penicillins  Home Medications   Prior to Admission medications    Medication Sig Start Date End Date Taking? Authorizing Provider  ACCU-CHEK AVIVA PLUS test strip USE 3 TO 4 TIMES DAILY TO CHECK BLOOD SUGAR. DIAG CODE 250.00. INSULIN DEPENDENT. 03/22/15   Aldine Contes, MD  amLODipine (NORVASC) 10 MG tablet TAKE 1 TABLET BY MOUTH DAILY 01/23/15   Aldine Contes, MD  aspirin 81 MG EC tablet Take 81 mg by mouth daily.      Historical Provider, MD  B-D INS SYR ULTRAFINE 1CC/31G 31G X 5/16" 1 ML MISC USE TO GIVE INSULIN 3 TIMES DAILY. DX CODE: 250.00. INSULIN DEPENDENT 06/21/15   Bartholomew Crews, MD  B-D ULTRAFINE III SHORT PEN 31G X 8 MM MISC USE TO GIVE INSULIN 3 TIMES DAILY 05/15/15   Oval Linsey, MD  Blood Glucose Monitoring Suppl (ACCU-CHEK AVIVA PLUS) W/DEVICE KIT 1 Device by Does not apply route QID. Use to test blood glucose 4 times daily.dx:250.00 06/28/13   Blain Pais, MD  calcium-vitamin D (OSCAL 500/200 D-3) 500-200 MG-UNIT per tablet Take 1 tablet by mouth 2 (two) times daily. 06/28/13 06/28/14  Blain Pais, MD  insulin aspart (NOVOLOG FLEXPEN) 100 UNIT/ML FlexPen Inject 7 units with breakfast, 10 with lunch, and 8 with dinner 09/27/15   Bartholomew Crews, MD  insulin glargine (LANTUS) 100 UNIT/ML injection  Inject 30 units under the skin in the morning and 30 units in the evening. 05/31/15   Bartholomew Crews, MD  lisinopril (PRINIVIL,ZESTRIL) 40 MG tablet TAKE 1 TABLET (40 MG TOTAL) BY MOUTH DAILY. 02/27/15   Blain Pais, MD  loratadine (CLARITIN) 10 MG tablet Take 1 tablet (10 mg total) by mouth daily. 04/18/14 04/18/15  Blain Pais, MD  methimazole (TAPAZOLE) 5 MG tablet Take 0.5 tablets (2.5 mg total) by mouth every other day. 04/20/15   Norval Gable, MD  metoprolol (LOPRESSOR) 50 MG tablet TAKE 1 TABLET (50 MG TOTAL) BY MOUTH 2 (TWO) TIMES DAILY. 02/27/15   Blain Pais, MD  omeprazole (PRILOSEC) 20 MG capsule TAKE 1 CAPSULE (20 MG TOTAL) BY MOUTH 2 (TWO) TIMES DAILY BEFORE A MEAL. 02/27/15   Blain Pais, MD  simvastatin (ZOCOR) 20 MG tablet TAKE 1 TABLET BY MOUTH DAILY AT 6PM 05/31/15   Bartholomew Crews, MD  spironolactone (ALDACTONE) 25 MG tablet TAKE 1 TABLET BY MOUTH DAILY 04/17/15   Sid Falcon, MD   BP 141/83 mmHg  Pulse 92  Temp(Src) 98.1 F (36.7 C) (Oral)  Resp 16  SpO2 100% Physical Exam  Nursing note and vitals reviewed.  60 year old female, resting comfortably and in no acute distress. Vital signs are significant for borderline hypertension. Oxygen saturation is 100%, which is normal. Head is normocephalic and atraumatic. PERRLA, EOMI. Oropharynx is clear. Neck is nontender and supple without adenopathy or JVD. Back is nontender and there is no CVA tenderness. Lungs are clear without rales, wheezes, or rhonchi. Chest is nontender. Heart has regular rate and rhythm without murmur. Abdomen is soft, flat, nontender without masses or hepatosplenomegaly and peristalsis is normoactive. Extremities have no cyanosis or edema, full range of motion is present. Skin is warm and dry without rash. Neurologic: Mental status is normal, cranial nerves are intact, there are no motor or sensory deficits.  ED Course  Procedures (including critical care time) Labs Review Results for orders placed or performed during the hospital encounter of 60/63/01  Basic metabolic panel  Result Value Ref Range   Sodium 139 135 - 145 mmol/L   Potassium 4.4 3.5 - 5.1 mmol/L   Chloride 102 101 - 111 mmol/L   CO2 28 22 - 32 mmol/L   Glucose, Bld 253 (H) 65 - 99 mg/dL   BUN 14 6 - 20 mg/dL   Creatinine, Ser 1.24 (H) 0.44 - 1.00 mg/dL   Calcium 10.1 8.9 - 10.3 mg/dL   GFR calc non Af Amer 47 (L) >60 mL/min   GFR calc Af Amer 54 (L) >60 mL/min   Anion gap 9 5 - 15  CBC  Result Value Ref Range   WBC 7.5 4.0 - 10.5 K/uL   RBC 4.99 3.87 - 5.11 MIL/uL   Hemoglobin 11.9 (L) 12.0 - 15.0 g/dL   HCT 38.6 36.0 - 46.0 %   MCV 77.4 (L) 78.0 - 100.0 fL   MCH 23.8 (L) 26.0 - 34.0 pg   MCHC 30.8  30.0 - 36.0 g/dL   RDW 14.2 11.5 - 15.5 %   Platelets 224 150 - 400 K/uL  Troponin I  Result Value Ref Range   Troponin I <0.03 <0.031 ng/mL  Differential  Result Value Ref Range   Neutrophils Relative % 55 %   Neutro Abs 4.4 1.7 - 7.7 K/uL   Lymphocytes Relative 36 %   Lymphs Abs 2.9 0.7 - 4.0 K/uL  Monocytes Relative 7 %   Monocytes Absolute 0.6 0.1 - 1.0 K/uL   Eosinophils Relative 2 %   Eosinophils Absolute 0.1 0.0 - 0.7 K/uL   Basophils Relative 0 %   Basophils Absolute 0.0 0.0 - 0.1 K/uL   I have personally reviewed and evaluated these lab results as part of my medical decision-making.   EKG Interpretation   Date/Time:  Saturday October 14 2015 05:25:56 EST Ventricular Rate:  88 PR Interval:  136 QRS Duration: 68 QT Interval:  350 QTC Calculation: 423 R Axis:   72 Text Interpretation:  Sinus rhythm Normal ECG When compared with ECG of  08/05/2012, No significant change was found Confirmed by [facility]  MD, Jashanti Clinkscale  ([id number]) on 10/14/2015 5:34:32 AM      MDM   Final diagnoses:  Palpitations    Transient episodes of tachycardia of uncertain cause. ECG shows normal sinus rhythm as does her cardiac monitor. It is noted that she is on methimazole for hyperthyroidism but I do not know what her current thyroid hormone levels are and there is no indication for repeat testing as this will be managed by her endocrinologist. I have discussed with her possibility of getting a Holter monitor or an event monitor to try to determine what type of tachycardia she is having. Old records are reviewed and I do not see any prior visits for any cardiac issues. It is noted that she is being treated for Graves' disease.  Care were Is unremarkable and she has had no tachycardia while in the ED. She is referred back to her PCP to consider having a vent monitor or Holter monitor done as an outpatient.  Delora Fuel, MD [id number]

## 2015-10-14 NOTE — ED Notes (Signed)
According to EMS, pt states she awoke this AM with the sensation of "fast heart rate". Pt denies pain to EMS.  Pt arrives to [facility] ED ambulatory from Ambulance, speaking in complete sentences, in no acute distress.

## 2015-10-14 NOTE — Discharge Instructions (Signed)
Talk with your doctor about having a Holter Monitor or Event Monitor.  Palpitations A palpitation is the feeling that your heartbeat is irregular or is faster than normal. It may feel like your heart is fluttering or skipping a beat. Palpitations are usually not a serious problem. However, in some cases, you may need further medical evaluation. CAUSES  Palpitations can be caused by:  Smoking.  Caffeine or other stimulants, such as diet pills or energy drinks.  Alcohol.  Stress and anxiety.  Strenuous physical activity.  Fatigue.  Certain medicines.  Heart disease, especially if you have a history of irregular heart rhythms (arrhythmias), such as atrial fibrillation, atrial flutter, or supraventricular tachycardia.  An improperly working pacemaker or defibrillator. DIAGNOSIS  To find the cause of your palpitations, your health care provider will take your medical history and perform a physical exam. Your health care provider may also have you take a test called an ambulatory electrocardiogram (ECG). An ECG records your heartbeat patterns over a 24-hour period. You may also have other tests, such as:  Transthoracic echocardiogram (TTE). During echocardiography, sound waves are used to evaluate how blood flows through your heart.  Transesophageal echocardiogram (TEE).  Cardiac monitoring. This allows your health care provider to monitor your heart rate and rhythm in real time.  Holter monitor. This is a portable device that records your heartbeat and can help diagnose heart arrhythmias. It allows your health care provider to track your heart activity for several days, if needed.  Stress tests by exercise or by giving medicine that makes the heart beat faster. TREATMENT  Treatment of palpitations depends on the cause of your symptoms and can vary greatly. Most cases of palpitations do not require any treatment other than time, relaxation, and monitoring your symptoms. Other causes,  such as atrial fibrillation, atrial flutter, or supraventricular tachycardia, usually require further treatment. HOME CARE INSTRUCTIONS   Avoid:  Caffeinated coffee, tea, soft drinks, diet pills, and energy drinks.  Chocolate.  Alcohol.  Stop smoking if you smoke.  Reduce your stress and anxiety. Things that can help you relax include:  A method of controlling things in your body, such as your heartbeats, with your mind (biofeedback).  Yoga.  Meditation.  Physical activity such as swimming, jogging, or walking.  Get plenty of rest and sleep. SEEK MEDICAL CARE IF:   You continue to have a fast or irregular heartbeat beyond 24 hours.  Your palpitations occur more often. SEEK IMMEDIATE MEDICAL CARE IF:  You have chest pain or shortness of breath.  You have a severe headache.  You feel dizzy or you faint. MAKE SURE YOU:  Understand these instructions.  Will watch your condition.  Will get help right away if you are not doing well or get worse.   This information is not intended to replace advice given to you by your health care provider. Make sure you discuss any questions you have with your health care provider.   Document Released: 09/06/2000 Document Revised: 09/14/2013 Document Reviewed: 11/08/2011 Elsevier Interactive Patient Education [employer].

## 2015-10-17 ENCOUNTER — Other Ambulatory Visit: Payer: Self-pay | Admitting: Internal Medicine

## 2015-10-17 DIAGNOSIS — R002 Palpitations: Secondary | ICD-10-CM

## 2015-10-26 ENCOUNTER — Telehealth: Payer: Self-pay | Admitting: Dietician

## 2015-10-26 NOTE — Telephone Encounter (Signed)
CDE called patient about helping her to get her A1C below 8%. She did not want to talk on the phone but agreed to see me before her doctor's appointment tomorrow.

## 2015-10-27 ENCOUNTER — Encounter: Payer: Self-pay | Admitting: Internal Medicine

## 2015-10-27 ENCOUNTER — Ambulatory Visit (INDEPENDENT_AMBULATORY_CARE_PROVIDER_SITE_OTHER): Payer: Medicaid Other | Admitting: Internal Medicine

## 2015-10-27 ENCOUNTER — Ambulatory Visit (INDEPENDENT_AMBULATORY_CARE_PROVIDER_SITE_OTHER): Payer: Medicaid Other | Admitting: Dietician

## 2015-10-27 ENCOUNTER — Encounter: Payer: Self-pay | Admitting: Dietician

## 2015-10-27 VITALS — Ht 61.5 in | Wt 135.7 lb

## 2015-10-27 VITALS — BP 155/65 | HR 80 | Temp 98.4°F | Ht 65.5 in | Wt 138.7 lb

## 2015-10-27 DIAGNOSIS — Z794 Long term (current) use of insulin: Secondary | ICD-10-CM

## 2015-10-27 DIAGNOSIS — R002 Palpitations: Secondary | ICD-10-CM

## 2015-10-27 DIAGNOSIS — R809 Proteinuria, unspecified: Secondary | ICD-10-CM | POA: Diagnosis not present

## 2015-10-27 DIAGNOSIS — E119 Type 2 diabetes mellitus without complications: Secondary | ICD-10-CM | POA: Diagnosis not present

## 2015-10-27 DIAGNOSIS — E1165 Type 2 diabetes mellitus with hyperglycemia: Secondary | ICD-10-CM

## 2015-10-27 DIAGNOSIS — IMO0001 Reserved for inherently not codable concepts without codable children: Secondary | ICD-10-CM

## 2015-10-27 DIAGNOSIS — E1169 Type 2 diabetes mellitus with other specified complication: Secondary | ICD-10-CM | POA: Diagnosis not present

## 2015-10-27 DIAGNOSIS — Z713 Dietary counseling and surveillance: Secondary | ICD-10-CM | POA: Diagnosis not present

## 2015-10-27 LAB — POCT GLYCOSYLATED HEMOGLOBIN (HGB A1C): HEMOGLOBIN A1C: 8.9

## 2015-10-27 LAB — GLUCOSE, CAPILLARY: Glucose-Capillary: 276 mg/dL — ABNORMAL HIGH (ref 65–99)

## 2015-10-27 MED ORDER — METFORMIN HCL 500 MG PO TABS: 500.0000 mg | ORAL_TABLET | Freq: Every day | ORAL | Status: DC

## 2015-10-27 MED ORDER — ACCU-CHEK AVIVA PLUS W/DEVICE KIT: PACK | Status: DC

## 2015-10-27 MED ORDER — "INSULIN SYRINGE-NEEDLE U-100 31G X 15/64"" 0.5 ML MISC": Status: DC

## 2015-10-27 MED ORDER — INSULIN PEN NEEDLE 31G X 5 MM MISC: Status: DC

## 2015-10-27 NOTE — Progress Notes (Signed)
Last A1C <8 was 03/2013, weight was 8# less.  Her diabetes distress screen was very low today.   Her calculated total daily dose of insulin based on her current weight is minimum 36 units/day and maximum 92 units/day. Basal dose range is would be 18-46 units/day. Her current total daily dose is 85, with 60 units (70%) basal insulin.  She is currently using 1.4units insulin/kg/day.  Patient likes using the insulin pen better, but does not want to use bother her insulin in pens because she fears she might mix them up.  Consider simpler regimen and insulin sensitizer.

## 2015-10-27 NOTE — Progress Notes (Signed)
Diabetes Self-Management Education  Visit Type: First/Initial  Appt. Start Time: 1020 Appt. End Time: 1050  10/27/2015  [name], identified by name and date of birth, is a 60 y.o. female with a diagnosis of Diabetes: Type 2.   ASSESSMENT  Height 5' 1.5" (1.562 m), weight 135 lb 11.2 oz (61.553 kg). Body mass index is 25.23 kg/(m^2).  Has central abdominal obesity She demonstrate duse of insulin pen without problem today and is able to recite her regimen and reports adherence without missing nay doses. Has been keeping both insulin in the refrigerator which make is easier to forget to take it. Educated patient to keep pen and vial she is using out of the refrigerator.      Diabetes Self-Management Education - 10/27/15 1100    Visit Information   Visit Type First/Initial- know patient from previous visits in past   Initial Visit   Diabetes Type Type 2   Are you currently following a meal plan? No   Are you taking your medications as prescribed? Yes   Date Diagnosed --  1997- pills did not work, shortly after dx went on insulin   Health Coping   How would you rate your overall health? Good   Psychosocial Assessment   Patient Belief/Attitude about Diabetes Motivated to control blood sugars   Self-care barriers Low literacy;Lack of transportation;Lack of material resources   Self-management support Doctor's office;Friends;CDE visits   Patient Concerns Monitoring   Special Needs None   Preferred Learning Style No preference indicated   Learning Readiness Ready   How often do you need to have someone help you when you read instructions, pamphlets, or other written materials from your doctor or pharmacy? 3 - Sometimes   What is the last grade level you completed in school? --  11   Complications   Last HgB A1C per patient/outside source 8.9 %   How often do you check your blood sugar? 3-4 times/day- has not brought her meter in since 2013. When asked to bring her meter to  her nexct visiot she asks for a new meter. Her 6 months medication refill hx shows consistent refilling of strips, brought logbook showing blood sugars 3x/day reasonable control (low 100s and 1 85 on 09/24/15 for which she reports symptoms.) before breakfast and lunch, higher at bedtime.    Fasting Blood glucose range (mg/dL) 70-129;130-179   Number of hypoglycemic episodes per month --  1   Number of hyperglycemic episodes per week --  1   Have you had a dilated eye exam in the past 12 months? Yes   Have you had a dental exam in the past 12 months? No  has no teeth, lost bottom dentures-wants new ones   Are you checking your feet? Yes   How many days per week are you checking your feet? 7   Dietary Intake   Breakfast more fried foods lately  2 bananas and 1 pack sweet instant oatmeal this am,    Beverage(s) diet soda   Exercise   Exercise Type ADL's   Patient Education   Previous Diabetes Education Yes here with same CDE   Monitoring Identified appropriate SMBG and/or A1C goals.   Individualized Goals (developed by patient)   Nutrition Less fried foods, more baked foods   Monitoring  Bring meter to next visit   Outcomes   Expected Outcomes Demonstrated interest in learning. Expect positive outcomes   Future DMSE 4-6 wks   Program Status Re-entered  Individualized Plan for Diabetes Self-Management Training:   Learning Objective:  Patient will have a greater understanding of diabetes self-management. Patient education plan is to attend individual and/or group sessions per assessed needs and concerns.: meal planning, monitoring, medicaitions   Plan:   Patient Instructions  Patient agreed to the following plan to improve blood sugars:   1- Eat baked foods more often than fried foods  2- take Novolog insulin right be foe meals  3- Bring meter to next office visit    Expected Outcomes:  Demonstrated interest in learning. Expect positive outcomes  Education material  provided: My Plate  If problems or questions, patient to contact team via:  Phone  Future DSME appointment: 4-6 wks

## 2015-10-27 NOTE — Progress Notes (Signed)
Medicine attending: Medical history, presenting problems, physical findings, and medications, reviewed with resident physician Dr Bing Neighbors on the day of the patient visit and I concur with her evaluation and management plan.

## 2015-10-27 NOTE — Patient Instructions (Signed)
Patient agreed to the following plan to improve blood sugars:   1- Eat baked foods more often than fried foods  2- take Novolog insulin right be foe meals  3- Bring meter to next office visit

## 2015-10-27 NOTE — Assessment & Plan Note (Signed)
Patient was in Ed for one episode of palpitations, feeling like her heart was beating fast. When she got to Ed heart rate was normal ,and EKG unremarkable. She has a hx of Graves dx, but is on treatment. Palpitations most possibly related to Graves dx, but she is on treatment and says she last had blood drawn 1 week ago by endocrinologist. Last TSH we have on file high at- 8.83, and T4 low at  0.58. Meds have been adjusted since then she says.  Plan- Pt will follow up with endocrinology( Has appointment in 2 weeks) as she is stable, without subsequent events, we will not draw labs today. - Pt already had cardiology follow up arranged for her from the Ed.

## 2015-10-27 NOTE — Assessment & Plan Note (Addendum)
Blood sugar- 276 today, with HgBa1c- 8.9. Pt has been on Lantus and short acting insulin. Unsure per chart why she is not on metformin.  Called patient back to ask her and she says she has never been on the medication and is not familiar with the name. Per chart she is on Lantus dose 30u evening and 30u at night, but pt appears abit confused about dose at one point says she takes 15u, then later says 30u. CBgs on log show lowest of 85u, am- mostly low to mid 100s, afternoon- mid 100s, evening- high 100s.    Plan- Start metformin at 500mg  once daily, can uptitrate as tolerated. - Will leave lantus dose as it is for now, can adjust dose based on respose to metformin, also considering hx of hypoglycemia.  - Follow up in 2 weeks.

## 2015-10-27 NOTE — Progress Notes (Signed)
Patient ID: [name], female   DOB: 1956-06-29, 60 y.o.   MRN: [id number]   Subjective:   Patient ID: TULEEN MANDELBAUM female   DOB: Mar 04, 1956 60 y.o.   MRN: [id number]  HPI: Ms.Nabiha L Stech is a 60 y.o. presented today to follow up on her ED visit- 10/13/2014 when she felt that her heart was beating fast. In the Ed her heart rate was normal and with normal EKG, trop X1 Was negative, BMet and CBC unremarkable for etiology of tachycardia. She was told to follow up with cardiology for possible holter monitoring.  Pt has not had similar symptoms since then. No chest pain, SOB.  Pt has a hx of Graves dx, she is been followed by her Endocrinologist Dr. Buddy Duty and is on Methimazole, which she has been compliant with with recent dose adjustments.  Past Medical History  Diagnosis Date  . Diabetes mellitus   . Hyperlipidemia   . Hypertension   . GERD (gastroesophageal reflux disease)   . Tobacco abuse     PT HAS QUIT  . Iron deficiency anemia   . Menopause   . Ganglion cyst     right wrist  . 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 Thyroid disease     Hyperthyroidism  . History of radiation therapy 02/27/10 -04/13/10    left base of tongue, bilat neck   Current Outpatient Prescriptions  Medication Sig Dispense Refill  . ACCU-CHEK AVIVA PLUS test strip USE 3 TO 4 TIMES DAILY TO CHECK BLOOD SUGAR. DIAG CODE 250.00. INSULIN DEPENDENT. (Patient not taking: Reported on 10/14/2015) 150 each 11  . amLODipine (NORVASC) 10 MG tablet TAKE 1 TABLET BY MOUTH DAILY 30 tablet 11  . aspirin 81 MG EC tablet Take 81 mg by mouth daily.      . B-D INS SYR ULTRAFINE 1CC/31G 31G X 5/16" 1 ML MISC USE TO GIVE INSULIN 3 TIMES DAILY. DX CODE: 250.00. INSULIN DEPENDENT (Patient not taking: Reported on 10/14/2015) 100 each 11  . B-D ULTRAFINE III SHORT PEN 31G X 8 MM MISC USE TO GIVE INSULIN 3 TIMES DAILY (Patient not taking: Reported on 10/14/2015) 100 each 11  . Blood Glucose  Monitoring Suppl (ACCU-CHEK AVIVA PLUS) W/DEVICE KIT 1 Device by Does not apply route QID. Use to test blood glucose 4 times daily.dx:250.00 (Patient not taking: Reported on 10/14/2015) 1 kit 0  . calcium-vitamin D (OSCAL 500/200 D-3) 500-200 MG-UNIT per tablet Take 1 tablet by mouth 2 (two) times daily. 180 tablet 3  . Dextromethorphan HBr (SCOT-TUSSIN DIABETES CF) 10 MG/5ML LIQD Take 10 mLs by mouth daily as needed (for cough).    . ferrous sulfate 325 (65 FE) MG tablet Take 325 mg by mouth daily with breakfast.    . insulin aspart (NOVOLOG FLEXPEN) 100 UNIT/ML FlexPen Inject 7 units with breakfast, 10 with lunch, and 8 with dinner 15 mL 5  . insulin glargine (LANTUS) 100 UNIT/ML injection Inject 30 units under the skin in the morning and 30 units in the evening. 20 mL 5  . lisinopril (PRINIVIL,ZESTRIL) 40 MG tablet TAKE 1 TABLET (40 MG TOTAL) BY MOUTH DAILY. 30 tablet 11  . loratadine (CLARITIN) 10 MG tablet Take 1 tablet (10 mg total) by mouth daily. (Patient not taking: Reported on 10/14/2015) 30 tablet 2  . methimazole (TAPAZOLE) 5 MG tablet Take 0.5 tablets (2.5 mg total) by mouth every other day. (Patient not taking: Reported on 10/14/2015) 30 tablet 0  .  metoprolol (LOPRESSOR) 50 MG tablet TAKE 1 TABLET (50 MG TOTAL) BY MOUTH 2 (TWO) TIMES DAILY. (Patient not taking: Reported on 10/14/2015) 60 tablet 11  . omeprazole (PRILOSEC) 20 MG capsule TAKE 1 CAPSULE (20 MG TOTAL) BY MOUTH 2 (TWO) TIMES DAILY BEFORE A MEAL. 60 capsule 9  . Polyethyl Glycol-Propyl Glycol (SYSTANE OP) Apply 1 drop to eye daily as needed (for dry eyes).    . simvastatin (ZOCOR) 20 MG tablet TAKE 1 TABLET BY MOUTH DAILY AT 6PM 30 tablet 5  . spironolactone (ALDACTONE) 25 MG tablet TAKE 1 TABLET BY MOUTH DAILY 30 tablet 6   No current facility-administered medications for this visit.   Family History  Problem Relation Age of Onset  . Stroke Father   . Cancer Father     unknown ca  . Cancer Paternal Aunt     colon ca    Social History   Social History  . Marital Status: Single    Spouse Name: N/A  . Number of Children: N/A  . Years of Education: 11   Occupational History  .    [employer]  Unemployed   Social History Main Topics  . Smoking status: Former Smoker -- 0.00 packs/day    Quit date: 02/09/2009  . Smokeless tobacco: Never Used  . Alcohol Use: No  . Drug Use: No  . Sexual Activity: Yes   Other Topics Concern  . None   Social History Narrative   Review of Systems: CONSTITUTIONAL- No Fever, weightloss, night sweat or change in appetite. SKIN- No Rash, colour changes or itching. HEAD- No Headache or dizziness. RESPIRATORY- No Cough or SOB. CARDIAC- No Palpitations since last Ed visit 1/21,  or chest pain. GI- No  vomiting, diarrhoea, abd pain. URINARY- No Frequency, or dysuria. NEUROLOGIC- No Numbness, or burning. [facility]- Denies depression or anxiety.  Objective:  Physical Exam: Filed Vitals:   10/27/15 1102  BP: 155/65  Pulse: 80  Temp: 98.4 F (36.9 C)  TempSrc: Oral  Height: 5' 5.5" (1.664 m)  Weight: 138 lb 11.2 oz (62.914 kg)  SpO2: 100%   GENERAL- alert, co-operative, appears as stated age, not in any distress. HEENT- Atraumatic, normocephalic, PERRL, EOMI, oral mucosa appears moist, neck supple. CARDIAC- RRR, no murmurs, rubs or gallops. RESP- Moving equal volumes of air, and clear to auscultation bilaterally, no wheezes or crackles. ABDOMEN- Soft, nontender,  bowel sounds present. NEURO- No obvious Cr N abnormality, strenght upper and lower extremities, intact, Gait- Normal. EXTREMITIES- pulse 2+, symmetric, no pedal edema. SKIN- Warm, dry, No rash or lesion. PSYCH- Normal mood and affect, appropriate thought content and speech.  Assessment & Plan:  The patient's case and plan of care was discussed with attending physician, Dr. Beryle Beams.  Please see problem based charting for assessment and plan.

## 2015-11-03 ENCOUNTER — Encounter: Payer: Self-pay | Admitting: Cardiology

## 2015-11-07 ENCOUNTER — Other Ambulatory Visit: Payer: Self-pay | Admitting: Internal Medicine

## 2015-11-13 ENCOUNTER — Encounter: Payer: Self-pay | Admitting: Cardiology

## 2015-11-13 ENCOUNTER — Ambulatory Visit (INDEPENDENT_AMBULATORY_CARE_PROVIDER_SITE_OTHER): Payer: Medicaid Other | Admitting: Cardiology

## 2015-11-13 VITALS — BP 132/88 | HR 74 | Ht 61.5 in | Wt 139.0 lb

## 2015-11-13 DIAGNOSIS — R0989 Other specified symptoms and signs involving the circulatory and respiratory systems: Secondary | ICD-10-CM

## 2015-11-13 DIAGNOSIS — E05 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: Secondary | ICD-10-CM

## 2015-11-13 DIAGNOSIS — R002 Palpitations: Secondary | ICD-10-CM | POA: Diagnosis not present

## 2015-11-13 DIAGNOSIS — E119 Type 2 diabetes mellitus without complications: Secondary | ICD-10-CM | POA: Diagnosis not present

## 2015-11-13 DIAGNOSIS — R809 Proteinuria, unspecified: Secondary | ICD-10-CM

## 2015-11-13 DIAGNOSIS — IMO0001 Reserved for inherently not codable concepts without codable children: Secondary | ICD-10-CM

## 2015-11-13 DIAGNOSIS — I10 Essential (primary) hypertension: Secondary | ICD-10-CM

## 2015-11-13 NOTE — Progress Notes (Signed)
Cardiology Office Note    Date:  11/13/2015   ID:  Susan Evans, DOB [birth year], MRN [id number]  PCP:  Fatima Sanger, MD  Cardiologist:   Candee Furbish, MD     History of Present Illness:  Susan Evans is a 60 y.o. female with diabetes, hypertension, hyperlipidemia who recently had an episode of palpitations prompting an emergency department visit where she felt as though her heart was beating very quickly. Once in the emergency room, heart rate was normal, EKG unremarkable. In the past had been treated for Graves' disease. Endocrinology has been monitoring her closely. Previous T4 was 0.58.  Denied any chest pain, syncope, bleeding, orthopnea, PND, rash.  Feels in neck, left. If moves around feels faster. No syncope. No chest pain. Just felt like heart was going to jump out of chest. sometimes her symptoms are worse at night. When she sits down and relaxes her symptoms tend to abate. She has not had any symptoms where she is felt her heart race so fast that she became lightheaded.     Past Medical History  Diagnosis Date  . Diabetes mellitus   . Hyperlipidemia   . Hypertension   . GERD (gastroesophageal reflux disease)   . Tobacco abuse     PT HAS QUIT  . Iron deficiency anemia   . Menopause   . Ganglion cyst     right wrist  . 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 Thyroid disease     Hyperthyroidism  . History of radiation therapy 02/27/10 -04/13/10    left base of tongue, bilat neck    Past Surgical History  Procedure Laterality Date  . Neck surgery      Outpatient Prescriptions Prior to Visit  Medication Sig Dispense Refill  . ACCU-CHEK AVIVA PLUS test strip USE 3 TO 4 TIMES DAILY TO CHECK BLOOD SUGAR. DIAG CODE 250.00. INSULIN DEPENDENT. 150 each 11  . amLODipine (NORVASC) 10 MG tablet TAKE 1 TABLET BY MOUTH DAILY 30 tablet 11  . aspirin 81 MG EC tablet Take 81 mg by mouth daily.      . Blood Glucose Monitoring Suppl (ACCU-CHEK  AVIVA PLUS) w/Device KIT Use to test blood glucose  3 times daily 1 kit 0  . Dextromethorphan HBr (SCOT-TUSSIN DIABETES CF) 10 MG/5ML LIQD Take 10 mLs by mouth daily as needed (for cough).    . ferrous sulfate 325 (65 FE) MG tablet Take 325 mg by mouth daily with breakfast.    . insulin aspart (NOVOLOG FLEXPEN) 100 UNIT/ML FlexPen Inject 7 units with breakfast, 10 with lunch, and 8 with dinner 15 mL 5  . insulin glargine (LANTUS) 100 UNIT/ML injection Inject 30 units under the skin in the morning and 30 units in the evening. 20 mL 5  . Insulin Pen Needle (B-D UF III MINI PEN NEEDLES) 31G X 5 MM MISC Use to inject Novolog 3 times a day 100 each 12  . Insulin Syringe-Needle U-100 31G X 15/64" 0.5 ML MISC Use to inject lantus insulin two times a day 100 each 7  . lisinopril (PRINIVIL,ZESTRIL) 40 MG tablet TAKE 1 TABLET (40 MG TOTAL) BY MOUTH DAILY. 30 tablet 11  . metFORMIN (GLUCOPHAGE) 500 MG tablet Take 1 tablet (500 mg total) by mouth daily with breakfast. 30 tablet 1  . methimazole (TAPAZOLE) 5 MG tablet Take 0.5 tablets (2.5 mg total) by mouth every other day. 30 tablet 0  . metoprolol (LOPRESSOR) 50 MG tablet  TAKE 1 TABLET (50 MG TOTAL) BY MOUTH 2 (TWO) TIMES DAILY. 60 tablet 11  . omeprazole (PRILOSEC) 20 MG capsule TAKE 1 CAPSULE (20 MG TOTAL) BY MOUTH 2 (TWO) TIMES DAILY BEFORE A MEAL. 60 capsule 9  . Polyethyl Glycol-Propyl Glycol (SYSTANE OP) Apply 1 drop to eye daily as needed (for dry eyes).    . simvastatin (ZOCOR) 20 MG tablet TAKE 1 TABLET BY MOUTH DAILY AT 6PM 30 tablet 5  . spironolactone (ALDACTONE) 25 MG tablet TAKE 1 TABLET BY MOUTH DAILY 90 tablet 3  . calcium-vitamin D (OSCAL 500/200 D-3) 500-200 MG-UNIT per tablet Take 1 tablet by mouth 2 (two) times daily. 180 tablet 3  . loratadine (CLARITIN) 10 MG tablet Take 1 tablet (10 mg total) by mouth daily. (Patient not taking: Reported on 10/14/2015) 30 tablet 2   No facility-administered medications prior to visit.     Allergies:    Hydrochlorothiazide and Penicillins   Social History   Social History  . Marital Status: Single    Spouse Name: N/A  . Number of Children: N/A  . Years of Education: 11   Occupational History  .    [employer]  Unemployed   Social History Main Topics  . Smoking status: Former Smoker -- 0.00 packs/day    Quit date: 02/09/2009  . Smokeless tobacco: Never Used  . Alcohol Use: No  . Drug Use: No  . Sexual Activity: Yes   Other Topics Concern  . None   Social History Narrative     Family History:  The patient's family history includes Cancer in her father and paternal aunt; Diabetes in her mother; Hypertension in her mother; Stroke in her father. Father had enlarged heart.   ROS:   Please see the history of present illness.    ROS Liopoma. No syncope no fevers, no orthopnea, no significant shortness of breath. All other systems reviewed and are negative.   PHYSICAL EXAM:   VS:  BP 132/88 mmHg  Pulse 74  Ht 5' 1.5" (1.562 m)  Wt 139 lb (63.05 kg)  BMI 25.84 kg/m2   GEN: Well nourished, well developed, in no acute distress HEENT: normal Neck: no JVD, left neck carotid bruit, or masses Cardiac: RRR; no murmurs, rubs, or gallops,no edema  Respiratory:  clear to auscultation bilaterally, normal work of breathing GI: soft, nontender, nondistended, + BS MS: no deformity or atrophy Skin: warm and dry, no rash Neuro:  Alert and Oriented x 3, Strength and sensation are intact Psych: euthymic mood, full affect  Wt Readings from Last 3 Encounters:  11/13/15 139 lb (63.05 kg)  10/27/15 138 lb 11.2 oz (62.914 kg)  10/27/15 135 lb 11.2 oz (61.553 kg)      Studies/Labs Reviewed:   EKG:  EKG is ordered today.  The ekg ordered today demonstrates 11/13/15-sinus rhythm, heart rate 76 bpm, septal infarct pattern, personally viewed  Recent Labs: 10/14/2015: BUN 14; Creatinine, Ser 1.24*; Hemoglobin 11.9*; Platelets 224; Potassium 4.4; Sodium 139   Lipid Panel    Component Value  Date/Time   CHOL 153 08/15/2014 1406   TRIG 228* 08/15/2014 1406   HDL 34* 08/15/2014 1406   CHOLHDL 4.5 08/15/2014 1406   VLDL 46* 08/15/2014 1406   LDLCALC 73 08/15/2014 1406    Additional studies/ records that were reviewed today include:  ER visit reviewed, lab work reviewed, prior EKG reviewed    ASSESSMENT:    No diagnosis found.   PLAN:  In order of problems listed above:  1. Palpitations-strange sensation for her. She feels it in her neck. Differential includes PVC/PACs, paroxysmal atrial tachycardia perhaps or atrial fibrillation which any of these can be concomitant with her thyroid disease. Hopefully as this continues to be regulated her symptoms will improve. I will check an event monitor for 30 days to make sure that we are not picking up on atrial fibrillation. Previous EKGs have been reassuring. I will also check an echocardiogram to ensure proper structure and function of her heart. Continue to utilize metoprolol. 2. Carotid bruit-left sided. 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 3. Diabetes-continue to encourage control.  4.  Graves' disease/hyperthyroidism-currently on methimazole. Metoprolol as well. Excellent. Followed by endocrinology.     Medication Adjustments/Labs and Tests Ordered: Current medicines are reviewed at length with the patient today.  Concerns regarding medicines are outlined above.  Medication changes, Labs and Tests ordered today are listed in the Patient Instructions below. There are no Patient Instructions on file for this visit.     Bobby Rumpf, MD  11/13/2015 9:30 AM    [address], [place], [place]  [address] Phone: [phone]; Fax: [phone]

## 2015-11-13 NOTE — Patient Instructions (Signed)
Medication Instructions:  The current medical regimen is effective;  continue present plan and medications.  Testing/Procedures: Your physician has requested that you have an echocardiogram. Echocardiography is a painless test that uses sound waves to create images of your heart. It provides your doctor with information about the size and shape of your heart and how well your heart's chambers and valves are working. This procedure takes approximately one hour. There are no restrictions for this procedure.  Your physician has recommended that you wear an event monitor. Event monitors are medical devices that record the heart's electrical activity. Doctors most often [place] these monitors to diagnose arrhythmias. Arrhythmias are problems with the speed or rhythm of the heartbeat. The monitor is a small, portable device. You can wear one while you do your normal daily activities. This is usually used to diagnose what is causing palpitations/syncope (passing out).  Your physician has requested that you have a carotid duplex. This test is an ultrasound of the carotid arteries in your neck. It looks at blood flow through these arteries that supply the brain with blood. Allow one hour for this exam. There are no restrictions or special instructions.  Follow-Up: Follow up as needed with Dr Marlou Porch.  If you need a refill on your cardiac medications before your next appointment, please call your pharmacy.  Thank you for choosing [facility]!!

## 2015-11-14 ENCOUNTER — Other Ambulatory Visit: Payer: Self-pay | Admitting: Internal Medicine

## 2015-11-16 ENCOUNTER — Encounter: Payer: Self-pay | Admitting: Internal Medicine

## 2015-11-16 ENCOUNTER — Ambulatory Visit (INDEPENDENT_AMBULATORY_CARE_PROVIDER_SITE_OTHER): Payer: Medicaid Other | Admitting: Internal Medicine

## 2015-11-16 VITALS — BP 149/66 | HR 80 | Temp 98.0°F | Wt 140.1 lb

## 2015-11-16 DIAGNOSIS — E1169 Type 2 diabetes mellitus with other specified complication: Secondary | ICD-10-CM | POA: Diagnosis not present

## 2015-11-16 DIAGNOSIS — I10 Essential (primary) hypertension: Secondary | ICD-10-CM | POA: Diagnosis present

## 2015-11-16 DIAGNOSIS — R809 Proteinuria, unspecified: Secondary | ICD-10-CM | POA: Diagnosis not present

## 2015-11-16 DIAGNOSIS — Z794 Long term (current) use of insulin: Secondary | ICD-10-CM | POA: Diagnosis not present

## 2015-11-16 DIAGNOSIS — Z7984 Long term (current) use of oral hypoglycemic drugs: Secondary | ICD-10-CM

## 2015-11-16 DIAGNOSIS — R002 Palpitations: Secondary | ICD-10-CM

## 2015-11-16 DIAGNOSIS — IMO0001 Reserved for inherently not codable concepts without codable children: Secondary | ICD-10-CM

## 2015-11-16 DIAGNOSIS — Z79899 Other long term (current) drug therapy: Secondary | ICD-10-CM

## 2015-11-16 LAB — GLUCOSE, CAPILLARY: GLUCOSE-CAPILLARY: 196 mg/dL — AB (ref 65–99)

## 2015-11-16 MED ORDER — METFORMIN HCL 500 MG PO TABS: 500.0000 mg | ORAL_TABLET | Freq: Two times a day (BID) | ORAL | Status: DC

## 2015-11-16 NOTE — Assessment & Plan Note (Signed)
Lab Results  Component Value Date   HGBA1C 8.9 10/27/2015   HGBA1C 8.8 07/27/2015   HGBA1C 9.4 04/20/2015     Assessment: Difficult case given her h/o hypoglycemia. Now taking 15U am 30U pm. Recently started on metformin 500mg  QD 3 weeks ago which she is tolerating well. No s/s hypoglycemia and review of log shows lowest in low 100s, highest 190s.  Plan: Medications:  Continue current lantus dose; will increase metformin to 500mg  BID and reassess at next visit Home glucose monitoring: Frequency:   Timing:   Instruction/counseling given: reminded to bring blood glucose meter & log to each visit, reminded to bring medications to each visit and discussed diet Educational resources provided:   Self management tools provided: home glucose logbook Other plans: F/u 17m

## 2015-11-16 NOTE — Progress Notes (Signed)
   Patient ID: Susan Evans female   DOB: [birth year] 60 y.o.   MRN: [id number]  Subjective:   HPI: Susan Evans is a 60 y.o. 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 Today, she has no complaints and says that her palpitations have improved.   Please see problem-based charting for status of medical issues pertinent to this visit.  Review of Systems: Pertinent items noted in HPI and remainder of comprehensive ROS otherwise negative.  Objective:  Physical Exam: Filed Vitals:   11/16/15 1326  BP: 149/66  Pulse: 80  Temp: 98 F (36.7 C)  TempSrc: Oral  Weight: 140 lb 1.6 oz (63.549 kg)  SpO2: 100%   Gen: Well-appearing, alert and oriented to person, place, and time HEENT: Oropharynx clear without erythema or exudate.  Neck: No cervical LAD, no thyromegaly or nodules, no JVD noted. CV: Normal rate, regular rhythm, no murmurs, rubs, or gallops Pulmonary: Normal effort, CTA bilaterally, no wheezing, rales, or rhonchi Abdominal: Soft, non-tender, non-distended, without rebound, guarding, or masses Extremities: Distal pulses 2+ in upper and lower extremities bilaterally, no tenderness, erythema or edema Skin: No atypical appearing moles. No rashes. There is a 4 cm non-tender, well-demarcated lipoma just medial to her L shoulder.  Assessment & Plan:  Please see problem-based charting for assessment and plan.  Blane Ohara, MD Resident Physician, PGY-1 Department of Internal Medicine [facility]

## 2015-11-16 NOTE — Patient Instructions (Addendum)
Susan Evans,  I am happy you're doing well! The only change today is to start taking the metformin 500mg  TWICE daily with meals, still once in the morning with breakfast, and again in the afternoon with a meal/dinner. Call us if you are experiencing belly pain or bad diarrhea. We will see you back in 3 months. Keep up the great work!  Abe People

## 2015-11-16 NOTE — Progress Notes (Signed)
Medicine attending: Medical history, presenting problems, physical findings, and medications, reviewed with resident physician Dr William Kennedy on the day of the patient visit and I concur with his evaluation and management plan. 

## 2015-11-16 NOTE — Assessment & Plan Note (Signed)
A: Is improving recently, without other symptoms. Only has now with significant exertion.  P: Continue current BB dose; greatly appreciate cardiology's help in management - will follow-up recs/results from upcoming appts

## 2015-11-16 NOTE — Assessment & Plan Note (Signed)
BP Readings from Last 3 Encounters:  11/16/15 149/66  11/13/15 132/88  10/27/15 155/65    Lab Results  Component Value Date   NA 139 10/14/2015   K 4.4 10/14/2015   CREATININE 1.24* 10/14/2015    Assessment: Blood pressure control:  near goal Progress toward BP goal:   near goal Comments: On amlodipine 10mg , lisinopril 40mg , metoprolol 50mg , spironolactone 25mg   Plan: Medications:  continue current medications Educational resources provided:   Self management tools provided:   Other plans: Reports good compliance with meds, has been better controlled recently on current regimen. Will continue current meds and follow up in 3 months

## 2015-11-17 ENCOUNTER — Ambulatory Visit (HOSPITAL_COMMUNITY)
Admission: RE | Admit: 2015-11-17 | Discharge: 2015-11-17 | Disposition: A | Discharge status: Home / Self Care | Payer: Medicaid Other | Source: Ambulatory Visit | Attending: Cardiology | Admitting: Cardiology

## 2015-11-17 DIAGNOSIS — I10 Essential (primary) hypertension: Secondary | ICD-10-CM | POA: Insufficient documentation

## 2015-11-17 DIAGNOSIS — I6523 Occlusion and stenosis of bilateral carotid arteries: Secondary | ICD-10-CM | POA: Insufficient documentation

## 2015-11-17 DIAGNOSIS — R0989 Other specified symptoms and signs involving the circulatory and respiratory systems: Secondary | ICD-10-CM | POA: Insufficient documentation

## 2015-11-17 DIAGNOSIS — Z87891 Personal history of nicotine dependence: Secondary | ICD-10-CM | POA: Diagnosis not present

## 2015-11-17 DIAGNOSIS — E785 Hyperlipidemia, unspecified: Secondary | ICD-10-CM | POA: Insufficient documentation

## 2015-11-17 DIAGNOSIS — E119 Type 2 diabetes mellitus without complications: Secondary | ICD-10-CM | POA: Insufficient documentation

## 2015-11-17 NOTE — Addendum Note (Signed)
Addended by: Freada Bergeron on: 11/17/2015 04:13 PM   Modules accepted: Orders

## 2015-11-20 ENCOUNTER — Other Ambulatory Visit: Payer: Self-pay | Admitting: Dietician

## 2015-11-20 DIAGNOSIS — IMO0001 Reserved for inherently not codable concepts without codable children: Secondary | ICD-10-CM

## 2015-11-20 MED ORDER — ACCU-CHEK SOFTCLIX LANCETS MISC: Status: DC

## 2015-11-20 NOTE — Telephone Encounter (Signed)
Needs lancets to check her blood sugar

## 2015-11-21 ENCOUNTER — Ambulatory Visit (INDEPENDENT_AMBULATORY_CARE_PROVIDER_SITE_OTHER): Payer: Medicaid Other

## 2015-11-21 ENCOUNTER — Other Ambulatory Visit: Payer: Self-pay

## 2015-11-21 ENCOUNTER — Ambulatory Visit (HOSPITAL_COMMUNITY): Payer: Medicaid Other | Attending: Cardiovascular Disease

## 2015-11-21 DIAGNOSIS — I119 Hypertensive heart disease without heart failure: Secondary | ICD-10-CM | POA: Diagnosis not present

## 2015-11-21 DIAGNOSIS — E119 Type 2 diabetes mellitus without complications: Secondary | ICD-10-CM | POA: Diagnosis not present

## 2015-11-21 DIAGNOSIS — R002 Palpitations: Secondary | ICD-10-CM

## 2015-11-21 DIAGNOSIS — I351 Nonrheumatic aortic (valve) insufficiency: Secondary | ICD-10-CM | POA: Insufficient documentation

## 2015-11-21 DIAGNOSIS — Z87891 Personal history of nicotine dependence: Secondary | ICD-10-CM | POA: Diagnosis not present

## 2015-11-21 DIAGNOSIS — E785 Hyperlipidemia, unspecified: Secondary | ICD-10-CM | POA: Insufficient documentation

## 2015-12-01 ENCOUNTER — Encounter: Payer: Self-pay | Admitting: Dietician

## 2015-12-01 ENCOUNTER — Ambulatory Visit (INDEPENDENT_AMBULATORY_CARE_PROVIDER_SITE_OTHER): Payer: Medicaid Other | Admitting: Dietician

## 2015-12-01 ENCOUNTER — Other Ambulatory Visit: Payer: Self-pay | Admitting: Student in an Organized Health Care Education/Training Program

## 2015-12-01 VITALS — Wt 141.4 lb

## 2015-12-01 DIAGNOSIS — E119 Type 2 diabetes mellitus without complications: Secondary | ICD-10-CM

## 2015-12-01 DIAGNOSIS — Z713 Dietary counseling and surveillance: Secondary | ICD-10-CM | POA: Diagnosis not present

## 2015-12-01 DIAGNOSIS — IMO0001 Reserved for inherently not codable concepts without codable children: Secondary | ICD-10-CM

## 2015-12-01 DIAGNOSIS — Z794 Long term (current) use of insulin: Secondary | ICD-10-CM

## 2015-12-01 LAB — GLUCOSE, CAPILLARY: Glucose-Capillary: 111 mg/dL — ABNORMAL HIGH (ref 65–99)

## 2015-12-01 NOTE — Progress Notes (Signed)
Diabetes Self-Management Education  Visit Type:  Follow-up  Appt. Start Time: 1335  Appt. End Time: 1410  12/01/2015  [name], identified by name and date of birth, is a 60 y.o. female with a diagnosis of Diabetes:  Marland Kitchen  Type 2 diabetes ASSESSMENT  Weight 141 lb 6.4 oz (64.139 kg). Body mass index is 26.29 kg/(m^2).  Has not started the second metformin pill yet " I wanted to see how it does" . Agreed to start tonight.  She did not bring her meter     Diabetes Self-Management Education - 12/01/15 1400    Psychosocial Assessment   Self-management support Doctor's office;Friends;CDE visits   Patient Concerns Glycemic Control   Complications   Number of hypoglycemic episodes per month 0   Number of hyperglycemic episodes per week --  2   Have you had a dilated eye exam in the past 12 months? Yes   Are you checking your feet? Yes   Dietary Intake   Breakfast oatmeal   Lunch fruit   Snack (afternoon) chicken, 1/2 c spaghetti   Dinner vegetables, chicken   Beverage(s) water,diet soda, keeps regular coke for lows   Exercise   Exercise Type ADL's;Light (walking / raking leaves)   Individualized Goals (developed by patient)   Nutrition Follow meal plan discussed   Patient Self-Evaluation of Goals - Patient rates self as meeting previously set goals (% of time)   Nutrition >75%   Medications >75%   Monitoring < 25%   Outcomes   Program Status Not Completed   Subsequent Visit   Since your last visit have you continued or begun to take your medications as prescribed? Yes   Since your last visit have you had your blood pressure checked? Yes   Is your most recent blood pressure lower, unchanged, or higher since your last visit? Unchanged   Since your last visit have you experienced any weight changes? Gain   Weight Gain (lbs) 6   Since your last visit, are you checking your blood glucose at least once a day? Yes     EDUCATION RECORD:  Learning Objective:  Patient will  have a greater understanding of diabetes self-management.  Individualized diabetes plan: discussed at previous visit with patient and includes:  Nutrition, medications, monitoring, physical activity,   To be completed next visit:  My plan to support myself in continuing these changes to care for my diabetes is to attend or contact: : Exercise ? Add local gym and fitness center as an option ? Other: Diabetes Support Groups Type 2 diabetes support group : 2nd Monday of every month from 6-7 PM at [address] [facility] conference room [phone]     Plan:   Patient Instructions  Please bring your meter so we can tell if your insulin needs to be adjusted.  Start taking the second metformin pill today in the evening  Keep up the great work eating baked and boiled foods and taking your Novolog insulin right before you eat.   See you on March 31st at 1:30 PM.  Susan Evans  [phone]    Expected Outcomes:  Demonstrated interest in learning. Expect positive outcomes Education material provided: Support group flyer If problems or questions, patient to contact team via:  Phone Future DSME appointment: - 3 wks

## 2015-12-01 NOTE — Patient Instructions (Signed)
Please bring your meter so we can tell if your insulin needs to be adjusted.  Start taking the second metformin pill today in the evening  Keep up the great work eating baked and boiled foods and taking your Novolog insulin right before you eat.   See you on March 31st at 1:30 PM.  Butch Penny  [phone]

## 2015-12-10 ENCOUNTER — Other Ambulatory Visit: Payer: Self-pay | Admitting: Internal Medicine

## 2015-12-22 ENCOUNTER — Ambulatory Visit: Payer: Medicaid Other | Admitting: Dietician

## 2015-12-27 ENCOUNTER — Ambulatory Visit: Payer: Medicaid Other | Admitting: Dietician

## 2016-01-01 ENCOUNTER — Telehealth: Payer: Self-pay | Admitting: Dietician

## 2016-01-01 NOTE — Telephone Encounter (Signed)
recheduled her missed visit

## 2016-01-09 ENCOUNTER — Telehealth: Payer: Self-pay | Admitting: Dietician

## 2016-01-09 NOTE — Telephone Encounter (Signed)
Patient rescheduled her appointment from this week to next week.

## 2016-01-10 ENCOUNTER — Ambulatory Visit: Payer: Medicaid Other | Admitting: Dietician

## 2016-01-13 ENCOUNTER — Other Ambulatory Visit: Payer: Self-pay | Admitting: Internal Medicine

## 2016-01-17 ENCOUNTER — Ambulatory Visit: Payer: Medicaid Other | Admitting: Dietician

## 2016-02-12 ENCOUNTER — Other Ambulatory Visit: Payer: Self-pay | Admitting: Internal Medicine

## 2016-02-21 ENCOUNTER — Encounter: Payer: Medicaid Other | Admitting: Internal Medicine

## 2016-02-22 ENCOUNTER — Other Ambulatory Visit: Payer: Self-pay | Admitting: Internal Medicine

## 2016-03-07 ENCOUNTER — Ambulatory Visit (INDEPENDENT_AMBULATORY_CARE_PROVIDER_SITE_OTHER): Payer: Medicaid Other | Admitting: Internal Medicine

## 2016-03-07 ENCOUNTER — Encounter: Payer: Self-pay | Admitting: Internal Medicine

## 2016-03-07 VITALS — BP 158/73 | HR 78 | Temp 98.7°F | Ht 61.5 in | Wt 137.2 lb

## 2016-03-07 DIAGNOSIS — R809 Proteinuria, unspecified: Secondary | ICD-10-CM

## 2016-03-07 DIAGNOSIS — E1129 Type 2 diabetes mellitus with other diabetic kidney complication: Secondary | ICD-10-CM

## 2016-03-07 DIAGNOSIS — E05 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: Secondary | ICD-10-CM

## 2016-03-07 DIAGNOSIS — I10 Essential (primary) hypertension: Secondary | ICD-10-CM | POA: Diagnosis not present

## 2016-03-07 DIAGNOSIS — IMO0001 Reserved for inherently not codable concepts without codable children: Secondary | ICD-10-CM

## 2016-03-07 DIAGNOSIS — Z794 Long term (current) use of insulin: Secondary | ICD-10-CM | POA: Diagnosis not present

## 2016-03-07 LAB — GLUCOSE, CAPILLARY: GLUCOSE-CAPILLARY: 82 mg/dL (ref 65–99)

## 2016-03-07 LAB — POCT GLYCOSYLATED HEMOGLOBIN (HGB A1C): HEMOGLOBIN A1C: 8.5

## 2016-03-07 MED ORDER — METFORMIN HCL 1000 MG PO TABS: 1000.0000 mg | ORAL_TABLET | Freq: Two times a day (BID) | ORAL | Status: DC

## 2016-03-07 NOTE — Assessment & Plan Note (Addendum)
Lab Results  Component Value Date   HGBA1C 8.5 03/07/2016   HGBA1C 8.9 10/27/2015   HGBA1C 8.8 07/27/2015     Assessment: Diabetes control:  improving but still not at goal Progress toward A1C goal:   improving slowly. A1c today 8.5 Comments: On Lantus 30 units a.m. 30 units p.m., and NovoLog 7, 10, and 8 with meals as well as metformin 500 mg twice a day. She is a challenging case due to her frequent lows from her inconsistent eating habits. Lowest blood sugar was 68 on review, with mild jitteriness as her only symptom.  Plan: Medications:  Increase metformin to his milligrams twice a day. Continue Lantus and NovoLog doses.

## 2016-03-07 NOTE — Assessment & Plan Note (Signed)
Patient continues follow-up endocrinology. Last seen 3 months ago, where she was taken off of methimazole due to several months of normal TFTs. She is due to follow up with him in August. While we are getting documents from her ENT, we have not received anything from endocrinologist I requested that she have them for the records at next office visit. We will hold off on checking TFTs at this time. -Follow-up endocrinology recommendations

## 2016-03-07 NOTE — Assessment & Plan Note (Addendum)
BP Readings from Last 3 Encounters:  03/07/16 158/73  11/16/15 149/66  11/13/15 132/88    Lab Results  Component Value Date   NA 139 10/14/2015   K 4.4 10/14/2015   CREATININE 1.24* 10/14/2015    Assessment: Blood pressure control:  not at goal Progress toward BP goal:   not at goal Comments: on amlodipine 10, Lisinopril 40, metoprolol 50, and spironolactone 25. She does report compliance with all medications. She has not smoked for 6 years now. Even on recheck today, her systolic is still in the mid 150s. Of note, she did drink 3 cups of coffee this morning and used dextromethorphan cough syrup before coming in. This is likely the cause of her acute worsening and otherwise moderately well controlled hypertension.  Plan: Medications:  continue current medications Educational resources provided: brochure (denies) Self management tools provided:   Other plans: Advised patient to abstain from caffeine and cough syrup the morning of clinic. We'll recheck at next visit. If still elevated at that time, consider increase in spironolactone. May also consider lisinopril-HCTZ combination

## 2016-03-07 NOTE — Patient Instructions (Addendum)
Susan Evans,   It was good seeing you again today.  At next follow-up, make sure you haven't had any caffeine or cough syrup before coming in, because both can elevate your blood pressure. I won't change around your blood pressure medications today. We'll increase the metformin to 1000mg  BID for your diabetes. We'll re-check it at next visit.  Thanks, Blane Ohara

## 2016-03-07 NOTE — Progress Notes (Signed)
   Patient ID: Susan Evans female   DOB: July 04, 1956 60 y.o.   MRN: [id number]  Subjective:   HPI: [name] is a 60 y.o. former smoker with PMH of IDDM, Grave's (now off MMI), oropharynx cancer s/p TORS and adjuvant CRT, HTN who presents to [facility] today for follow-up of her T2DM and HTN.   Please see problem-based charting for status of medical issues pertinent to this visit.  Review of Systems: Pertinent items noted in HPI and remainder of comprehensive ROS otherwise negative.  Objective:  Physical Exam: Filed Vitals:   03/07/16 1052 03/07/16 1123  BP: 158/68 158/73  Pulse: 79 78  Temp: 98.7 F (37.1 C)   TempSrc: Oral   Height: 5' 1.5" (1.562 m)   Weight: 137 lb 3.2 oz (62.234 kg)   SpO2: 100%    Gen: Well-appearing, alert and oriented to person, place, and time HEENT: Oropharynx clear without erythema or exudate.  Neck: No cervical LAD, no thyromegaly or nodules, no JVD noted. CV: Normal rate, regular rhythm, no murmurs, rubs, or gallops Pulmonary: Normal effort, CTA bilaterally, no wheezing, rales, or rhonchi Abdominal: Soft, non-tender, non-distended, without rebound, guarding, or masses Extremities: Distal pulses 2+ in upper and lower extremities bilaterally, no tenderness, erythema or edema Skin: No atypical appearing moles. No rashes  Assessment & Plan:  Please see problem-based charting for assessment and plan.  Blane Ohara, MD Resident Physician, PGY-1 Department of Internal Medicine [facility]

## 2016-03-08 NOTE — Progress Notes (Signed)
Internal Medicine Clinic Attending  Case discussed with Dr. Kennedy at the time of the visit.  We reviewed the resident's history and exam and pertinent patient test results.  I agree with the assessment, diagnosis, and plan of care documented in the resident's note.  

## 2016-03-08 NOTE — Addendum Note (Signed)
Addended by: Lalla Brothers T on: 03/08/2016 10:36 AM   Modules accepted: Level of Service

## 2016-03-12 ENCOUNTER — Encounter: Payer: Self-pay | Admitting: *Deleted

## 2016-03-15 ENCOUNTER — Other Ambulatory Visit: Payer: Self-pay | Admitting: Internal Medicine

## 2016-03-18 NOTE — Telephone Encounter (Addendum)
Needs Sep / Oct appt PCP

## 2016-03-25 NOTE — Telephone Encounter (Signed)
Please schedule an appointment for Sept. / Oct. Per Dr. Lynnae January. Thanks!

## 2016-04-08 ENCOUNTER — Telehealth: Payer: Self-pay | Admitting: Dietician

## 2016-04-08 NOTE — Telephone Encounter (Signed)
Called to follow up goals for DSMT/E from 11/2015:  Susan Evans reports taking the 2nd pill of metformin and is taking 500 mg twice day, plans to increase it tomorrow per Dr. Norva Karvonen instructions to 1000 mg twice daily.  She is eating more baked and boiled foods and taking her Novolog with her meals.  She reports her blood sugars are 140/150/114/112- says she likes it 140 because she gets jittery at 112, sometimes it is 90/80 but does not get jittery. We discussed that it could be that her sugar was falling more quickly when she measured the 112 than when she measured the 80 or 90. She is going out of town and will call to schedule CDE on same day as doctor in September.

## 2016-04-30 ENCOUNTER — Other Ambulatory Visit: Payer: Self-pay | Admitting: Internal Medicine

## 2016-05-02 NOTE — Telephone Encounter (Signed)
Pt called and stated she has been out of Lantus for a couple of days. Thanks

## 2016-05-02 NOTE — Telephone Encounter (Signed)
Pt called - no answer; left message refill has been completed.

## 2016-05-03 ENCOUNTER — Other Ambulatory Visit: Payer: Self-pay | Admitting: Internal Medicine

## 2016-05-03 DIAGNOSIS — Z1231 Encounter for screening mammogram for malignant neoplasm of breast: Secondary | ICD-10-CM

## 2016-05-14 ENCOUNTER — Other Ambulatory Visit: Payer: Self-pay | Admitting: Internal Medicine

## 2016-05-23 ENCOUNTER — Ambulatory Visit: Payer: Medicaid Other

## 2016-05-25 ENCOUNTER — Other Ambulatory Visit: Payer: Self-pay | Admitting: Internal Medicine

## 2016-05-29 ENCOUNTER — Ambulatory Visit
Admission: RE | Admit: 2016-05-29 | Discharge: 2016-05-29 | Disposition: A | Discharge status: Home / Self Care | Payer: Medicaid Other | Source: Ambulatory Visit | Attending: Internal Medicine | Admitting: Internal Medicine

## 2016-05-29 DIAGNOSIS — Z1231 Encounter for screening mammogram for malignant neoplasm of breast: Secondary | ICD-10-CM

## 2016-06-14 LAB — HM DIABETES EYE EXAM

## 2016-06-21 ENCOUNTER — Encounter: Payer: Self-pay | Admitting: Internal Medicine

## 2016-06-21 ENCOUNTER — Ambulatory Visit (INDEPENDENT_AMBULATORY_CARE_PROVIDER_SITE_OTHER): Payer: Medicaid Other | Admitting: Internal Medicine

## 2016-06-21 VITALS — BP 153/79 | HR 82 | Temp 98.0°F | Wt 136.7 lb

## 2016-06-21 DIAGNOSIS — Z794 Long term (current) use of insulin: Secondary | ICD-10-CM

## 2016-06-21 DIAGNOSIS — E785 Hyperlipidemia, unspecified: Secondary | ICD-10-CM

## 2016-06-21 DIAGNOSIS — R808 Other proteinuria: Secondary | ICD-10-CM | POA: Diagnosis not present

## 2016-06-21 DIAGNOSIS — Z87891 Personal history of nicotine dependence: Secondary | ICD-10-CM

## 2016-06-21 DIAGNOSIS — E1129 Type 2 diabetes mellitus with other diabetic kidney complication: Secondary | ICD-10-CM | POA: Diagnosis not present

## 2016-06-21 DIAGNOSIS — IMO0001 Reserved for inherently not codable concepts without codable children: Secondary | ICD-10-CM

## 2016-06-21 DIAGNOSIS — I10 Essential (primary) hypertension: Secondary | ICD-10-CM | POA: Diagnosis not present

## 2016-06-21 DIAGNOSIS — E1165 Type 2 diabetes mellitus with hyperglycemia: Secondary | ICD-10-CM | POA: Diagnosis not present

## 2016-06-21 DIAGNOSIS — Z79899 Other long term (current) drug therapy: Secondary | ICD-10-CM | POA: Diagnosis not present

## 2016-06-21 DIAGNOSIS — E0800 Diabetes mellitus due to underlying condition with hyperosmolarity without nonketotic hyperglycemic-hyperosmolar coma (NKHHC): Principal | ICD-10-CM

## 2016-06-21 DIAGNOSIS — Z0000 Encounter for general adult medical examination without abnormal findings: Secondary | ICD-10-CM

## 2016-06-21 DIAGNOSIS — M810 Age-related osteoporosis without current pathological fracture: Secondary | ICD-10-CM

## 2016-06-21 DIAGNOSIS — E05 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: Secondary | ICD-10-CM | POA: Diagnosis not present

## 2016-06-21 DIAGNOSIS — Z23 Encounter for immunization: Secondary | ICD-10-CM

## 2016-06-21 LAB — POCT GLYCOSYLATED HEMOGLOBIN (HGB A1C): HEMOGLOBIN A1C: 9

## 2016-06-21 LAB — GLUCOSE, CAPILLARY: GLUCOSE-CAPILLARY: 94 mg/dL (ref 65–99)

## 2016-06-21 MED ORDER — LISINOPRIL 40 MG PO TABS: ORAL_TABLET | ORAL | 0 refills | Status: DC

## 2016-06-21 MED ORDER — SPIRONOLACTONE 25 MG PO TABS: 25.0000 mg | ORAL_TABLET | Freq: Every day | ORAL | 3 refills | Status: DC

## 2016-06-21 MED ORDER — AMLODIPINE BESYLATE 10 MG PO TABS: 10.0000 mg | ORAL_TABLET | Freq: Every day | ORAL | 3 refills | Status: DC

## 2016-06-21 NOTE — Assessment & Plan Note (Signed)
She is currently in remission, she denies any symptoms. She is off of M MI.

## 2016-06-21 NOTE — Assessment & Plan Note (Addendum)
Her current A1c was 9 with CBG of 94. Her previous A1c was 8.5 in June 2017. Her metformin was increased from 500 twice daily to 1000 twice daily at that point.  She has uncontrolled diabetes. She states that she do not follow the dietary restrictions. She eats a lot of fries. She never brought her glucometer  today, but states that she checked her blood sugar 3-4 times daily. She needs adjustment in her insulin regimen. We told her to bring her glucometer meter during next follow-up visit after 2 weeks, so I can adjust her insulin accordingly. I also gave her a referral for Butch Penny, for diabetic education.

## 2016-06-21 NOTE — Assessment & Plan Note (Signed)
BP Readings from Last 3 Encounters:  06/21/16 (!) 153/79  03/07/16 (!) 158/73  11/16/15 (!) 149/66   She was having a high blood pressure during this visit. It was high during her last 2 visits too. She states that at home it stays between 794 to 801 systolic. She never brought any log. She is also saying that she had some fries and coffee this morning. It looks like she is not compliant with her diet. She is on amlodipine 10 mg daily, lisinopril 40 mg daily, metoprolol 50 mg twice daily and Spironolactone 25 mg daily.  Plan. Referral for [facility]. I provided her with a log sheet and asked her to write down her blood pressure at least twice daily and bring back that she during her next follow-up visit in 2 weeks. Continue with the current management.

## 2016-06-21 NOTE — Assessment & Plan Note (Signed)
She just had her mammogram last week which was normal. She will be due for her colonoscopy on November 2017, a referral was provided. She is due for Pap smear, she wants to come back for that. She had her eye exam last week, according to the patient it was normal. I don't have any records yet. She had her DEXA scan done in 2014 which shows osteoporosis never repeated. I ordered a new DEXA scan for reevaluation. She was provided with a flu shot today.

## 2016-06-21 NOTE — Patient Instructions (Signed)
Thank you for visiting clinic today. As we talked you are due for  few exams for example Colonoscopy  you will be due in November of this year, I did put a referral so you can make an appointment. You are due for your bone density, as your previous exams show thin bones, I would like to repeat it. As osteoporosis can cause bone fractures easily. You are due for Pap smear, as you have told me that you wanted to come back for that testing. Please make an appointment whenever you ready for that. We did some blood workup to check your kidney functions today. We also did some blood workup to check your cholesterol levels, as I told you if that is still abnormal I might change her cholesterol medicine. Your blood sugar is uncontrolled with A1c of 9 which is very high. Please bring your glucometer meter during your next follow-up exam. So I can adjust your insulin accordingly. Your blood pressure was high in the clinic, as you told us that it remains within normal limit at home, I'm not making any adjustment in your medicine, please take them as directed. I'm providing you with a log, please fill in as you check your blood pressure at home and bring get with you during next follow-up. Please follow-up with me within the next 2 weeks.

## 2016-06-21 NOTE — Progress Notes (Signed)
CC: For follow-up of her diabetes and hypertension.  HPI:  [name] is a 60 y.o. with past medical history as listed below, came to the clinic for follow-up of her diabetes and hypertension. She never brought her glucometer meter today, she states at home her blood sugar from mostly stays between 152 -little above 200. On couple of occasions it did went more than 400 and during that time she forgot to took her morning dose of insulin. She is on metformin 1000 mg twice daily, Lantus 30 units twice daily, and NovoLog 7 in the morning 10 with lunch and 8 with dinner. She said that she is compliant with her medications. She has no complaints today. She had her mammogram done last week which was normal. She is due for her Pap smear, she said she wants to come back for that. She just had an eye exam last week, according to the patient it was normal. She will be due for her next colonoscopy in November 2017, last in 10 years ago. I gave her a referral. We gave her a flu shot today.  Past Medical History:  Diagnosis Date  . Diabetes mellitus   . Ganglion cyst    right wrist  . GERD (gastroesophageal reflux disease)   . History of radiation therapy 02/27/10 -04/13/10   left base of tongue, bilat neck  . Hyperlipidemia   . Hypertension   . Iron deficiency anemia   . 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 Menopause   . Thyroid disease    Hyperthyroidism  . Tobacco abuse    PT HAS QUIT    Review of Systems:  General: Denies fever, chills, fatigue, unexpected weight loss, change in appetite and diaphoresis.  Respiratory: Denies SOB, cough, DOE, chest tightness, and wheezing.   Cardiovascular: Denies chest pain and palpitations.  Gastrointestinal: Denies nausea, vomiting, abdominal pain, diarrhea, constipation, blood in stool and abdominal distention.  Genitourinary: Denies dysuria, urgency, frequency, hematuria, suprapubic pain and flank pain. Endocrine:  Denies hot or cold intolerance, polyuria, and polydipsia. Musculoskeletal: Denies myalgias, back pain, joint swelling, arthralgias and gait problem.  Skin: Denies pallor, rash and wounds.  Neurological: Denies dizziness, headaches, weakness, lightheadedness, numbness, seizures, and syncope. Psychiatric/Behavioral: Denies mood changes, confusion, nervousness, sleep disturbance and agitation.  Physical Exam:  Vitals:   06/21/16 1318  BP: (!) 153/79  Pulse: 82  Temp: 98 F (36.7 C)  TempSrc: Oral  SpO2: 100%  Weight: 136 lb 11.2 oz (62 kg)    General: Vital signs reviewed.  Patient is well-developed and well-nourished, in no acute distress and cooperative with exam.  Head: Normocephalic and atraumatic. Eyes: EOMI, conjunctivae normal, no scleral icterus.  Neck: Supple, trachea midline, normal ROM, no JVD, masses, thyromegaly. Cardiovascular: RRR, S1 normal, S2 normal, no murmurs, gallops, or rubs. Pulmonary/Chest: Clear to auscultation bilaterally, no wheezes, rales, or rhonchi. Abdominal: Soft, non-tender, non-distended, BS +, no masses, organomegaly, or guarding present.  Musculoskeletal: No joint deformities, erythema, or stiffness, ROM full and nontender. Extremities: No lower extremity edema bilaterally,  pulses symmetric and intact bilaterally. No cyanosis or clubbing. Neurological: A&O x3, Strength is normal and symmetric bilaterally, cranial nerve II-XII are grossly intact, no focal motor deficit, sensory intact to light touch bilaterally.  Skin: Warm, dry and intact. No rashes or erythema. Psychiatric: Normal mood and affect. speech and behavior is normal. Cognition and memory are normal.   Assessment & Plan:   See Encounters Tab for problem based charting.  Patient seen with Dr. Lynnae January.

## 2016-06-21 NOTE — Assessment & Plan Note (Signed)
She is currently on simvastatin 20 mg daily. Her last lipid exam was 2 years ago. We'll repeat her blood workup for her lipid profile.  We will adjust her dose accordingly, if needed.

## 2016-06-21 NOTE — Assessment & Plan Note (Signed)
She had an diagnosis of osteoporosis during her previous DEXA scan in 2014. She states that she was being treated for that for 2 years. She needed repeat DEXA scan. I ordered a new DEXA scan today.

## 2016-06-22 LAB — BMP8+ANION GAP
Anion Gap: 18 mmol/L (ref 10.0–18.0)
BUN / CREAT RATIO: 6 — AB (ref 9–23)
BUN: 6 mg/dL (ref 6–24)
CHLORIDE: 102 mmol/L (ref 96–106)
CO2: 25 mmol/L (ref 18–29)
Calcium: 10.2 mg/dL (ref 8.7–10.2)
Creatinine, Ser: 1.07 mg/dL — ABNORMAL HIGH (ref 0.57–1.00)
GFR calc Af Amer: 66 mL/min/{1.73_m2} (ref >59)
GFR calc non Af Amer: 57 mL/min/{1.73_m2} — ABNORMAL LOW (ref >59)
GLUCOSE: 99 mg/dL (ref 65–99)
POTASSIUM: 5.3 mmol/L — AB (ref 3.5–5.2)
SODIUM: 145 mmol/L — AB (ref 134–144)

## 2016-06-22 LAB — LIPID PANEL
CHOL/HDL RATIO: 5.1 ratio — AB (ref 0.0–4.4)
Cholesterol, Total: 148 mg/dL (ref 100–199)
HDL: 29 mg/dL — ABNORMAL LOW (ref >39)
LDL CALC: 43 mg/dL (ref 0–99)
Triglycerides: 380 mg/dL — ABNORMAL HIGH (ref 0–149)
VLDL CHOLESTEROL CAL: 76 mg/dL — AB (ref 5–40)

## 2016-06-22 LAB — MICROALBUMIN / CREATININE URINE RATIO
CREATININE, UR: 18.4 mg/dL
MICROALB/CREAT RATIO: 4290.8 mg/g creat — ABNORMAL HIGH (ref 0.0–30.0)
Microalbumin, Urine: 789.5 ug/mL

## 2016-06-24 NOTE — Progress Notes (Signed)
Internal Medicine Clinic Attending  I saw and evaluated the patient.  I personally confirmed the key portions of the history and exam documented by Dr. Amin and I reviewed pertinent patient test results.  The assessment, diagnosis, and plan were formulated together and I agree with the documentation in the resident's note. 

## 2016-06-25 ENCOUNTER — Encounter: Payer: Self-pay | Admitting: Internal Medicine

## 2016-07-05 ENCOUNTER — Ambulatory Visit (INDEPENDENT_AMBULATORY_CARE_PROVIDER_SITE_OTHER): Payer: Medicaid Other | Admitting: Dietician

## 2016-07-05 ENCOUNTER — Telehealth: Payer: Self-pay | Admitting: *Deleted

## 2016-07-05 ENCOUNTER — Ambulatory Visit (INDEPENDENT_AMBULATORY_CARE_PROVIDER_SITE_OTHER): Payer: Medicaid Other | Admitting: Internal Medicine

## 2016-07-05 ENCOUNTER — Encounter: Payer: Self-pay | Admitting: Internal Medicine

## 2016-07-05 VITALS — BP 133/77 | HR 81 | Temp 98.0°F | Ht 61.5 in | Wt 133.6 lb

## 2016-07-05 DIAGNOSIS — Z79899 Other long term (current) drug therapy: Secondary | ICD-10-CM

## 2016-07-05 DIAGNOSIS — E1121 Type 2 diabetes mellitus with diabetic nephropathy: Secondary | ICD-10-CM

## 2016-07-05 DIAGNOSIS — J209 Acute bronchitis, unspecified: Secondary | ICD-10-CM | POA: Diagnosis not present

## 2016-07-05 DIAGNOSIS — Z87891 Personal history of nicotine dependence: Secondary | ICD-10-CM | POA: Diagnosis not present

## 2016-07-05 DIAGNOSIS — R808 Other proteinuria: Secondary | ICD-10-CM | POA: Diagnosis not present

## 2016-07-05 DIAGNOSIS — I10 Essential (primary) hypertension: Secondary | ICD-10-CM | POA: Diagnosis present

## 2016-07-05 DIAGNOSIS — Z23 Encounter for immunization: Secondary | ICD-10-CM

## 2016-07-05 DIAGNOSIS — Z794 Long term (current) use of insulin: Secondary | ICD-10-CM | POA: Diagnosis not present

## 2016-07-05 DIAGNOSIS — Z7289 Other problems related to lifestyle: Secondary | ICD-10-CM | POA: Diagnosis not present

## 2016-07-05 DIAGNOSIS — E119 Type 2 diabetes mellitus without complications: Secondary | ICD-10-CM

## 2016-07-05 LAB — BASIC METABOLIC PANEL
Anion gap: 8 (ref 5–15)
BUN: 6 mg/dL (ref 6–20)
CALCIUM: 10.2 mg/dL (ref 8.9–10.3)
CO2: 28 mmol/L (ref 22–32)
CREATININE: 1.01 mg/dL — AB (ref 0.44–1.00)
Chloride: 101 mmol/L (ref 101–111)
GFR, EST NON AFRICAN AMERICAN: 60 mL/min — AB (ref >60)
Glucose, Bld: 74 mg/dL (ref 65–99)
Potassium: 4.6 mmol/L (ref 3.5–5.1)
SODIUM: 137 mmol/L (ref 135–145)

## 2016-07-05 MED ORDER — GUAIFENESIN ER 600 MG PO TB12: 600.0000 mg | ORAL_TABLET | Freq: Two times a day (BID) | ORAL | 0 refills | Status: DC

## 2016-07-05 MED ORDER — AMLODIPINE BESYLATE 10 MG PO TABS: 10.0000 mg | ORAL_TABLET | Freq: Every day | ORAL | 3 refills | Status: DC

## 2016-07-05 MED ORDER — VERAPAMIL HCL ER 120 MG PO TBCR: 120.0000 mg | EXTENDED_RELEASE_TABLET | Freq: Every day | ORAL | 0 refills | Status: DC

## 2016-07-05 NOTE — Telephone Encounter (Signed)
I spoke to the pharmacist. Medication has been changed. Thanks.

## 2016-07-05 NOTE — Patient Instructions (Signed)
Susan Evans it was nice meeting you today.   For blood pressure:  -STOP taking Amlodipine  -Start taking Verapamil as instructed   -Continue taking Metoprolol, Lisinopril, and Spironolactone as before   For congestion:  -Take Mucinex as instructed    Return for a follow-up visit in 2 weeks.

## 2016-07-05 NOTE — Patient Instructions (Signed)
Try to eat at least 2 carb servings for each meal and no more than 1 serving for snacks.  Carbs are: starches, starchy vegetables, sweets, fruit and dairy like milk and yogurt,Oatmeal, rice,grits, bread, crackers, corn, pintos   Please follow up in 2 weeks.   Consider the Vgo patch and let me know if you are interested.

## 2016-07-05 NOTE — Telephone Encounter (Signed)
Pharmacy calls and states that there is a possible interaction between verapamil and simvastatin,  Please advise

## 2016-07-05 NOTE — Progress Notes (Signed)
Diabetes Self-Management Education  Visit Type:  Follow-up  Appt. Start Time: 1130 Appt. End Time: 1215  07/05/2016  [name], identified by name and date of birth, is a 60 y.o. female with a diagnosis of Diabetes:  .   ASSESSMENT  Height measured 61" weight 133#  Current insulin 30u lantus twice daily and 5 units three times a day for TDD of 75 units/day Estimated TDD 31-73 units/day  she reports feeljng very hungry after meals after she thinks she has eaten enough, does not take the second metformin because her sugar went to 80s when she did. She reports compliance with the lantus but says she drops too low if she increases her mealtime insulin higher than 5 units. Felt low in the office today with a CBG of 84.   Fear of hypoglycemia is preventing this patient from improving her diabetes control. Her current dose is >1.2u/kg, and is on 80% basal insulin and 20% bolus insulin. Most people have a hard time achieving A1C target on a 70%:30% combination. Suggest decreasing her basal insulin to 60% or less of her TDD- 40 units daily max. Showed her the VGO to see if that may help her gain control.        Diabetes Self-Management Education - 07/05/16 1600      Health Coping   How would you rate your overall health? Good     Psychosocial Assessment   Patient Belief/Attitude about Diabetes (P)  Motivated to manage diabetes   Self-care barriers (P)  Lack of material resources   Self-management support (P)  Doctor's office;Friends;CDE visits   Patient Concerns (P)  Nutrition/Meal planning;Glycemic Control   Special Needs (P)  None   Preferred Learning Style (P)  No preference indicated   Learning Readiness (P)  Ready     Pre-Education Assessment   Patient understands incorporating nutritional management into lifestyle. (P)  Needs Review   Patient understands how to develop strategies to promote health/change behavior. (P)  Needs Review     Complications   Last HgB A1C per  patient/outside source (P)  9 %   How often do you check your blood sugar? (P)  1-2 times/day   Fasting Blood glucose range (mg/dL) (P)  130-179;180-200   Number of hypoglycemic episodes per month (P)  --  1   Have you had a dilated eye exam in the past 12 months? (P)  Yes   Have you had a dental exam in the past 12 months? (P)  No   Are you checking your feet? (P)  Yes     Subsequent Visit   Since your last visit have you continued or begun to take your medications as prescribed? No   Since your last visit have you experienced any weight changes? Loss   Weight Loss (lbs) (P)  --  8   Since your last visit, are you checking your blood glucose at least once a day? (P)  Yes      Learning Objective:  Patient will have a greater understanding of diabetes self-management. Patient education plan is to attend individual and/or group sessions per assessed needs and concerns. My plan to support myself in continuing these changes to care for my diabetes is to attend or contact:   ? Add other local support resources -doctor's office, CDE, Dietitian, pharmacist, church  Plan:   Patient Instructions  Try to eat at least 2 carb servings for each meal and no more than 1 serving for snacks.  Carbs are: starches, starchy vegetables, sweets, fruit and dairy like milk and yogurt,Oatmeal, rice,grits, bread, crackers, corn, pintos   Please follow up in 2 weeks.   Consider the Vgo patch and let me know if you are interested.     Expected Outcomes: good, she is interested in improving her diabetes    Education material provided: Carbohydrate counting sheet  If problems or questions, patient to contact team via:  Phone  Future DSME appointment: -   2 weeks

## 2016-07-05 NOTE — Telephone Encounter (Signed)
Dr Marlowe Sax called the pharmacy and spoke to pharmacist

## 2016-07-06 DIAGNOSIS — J209 Acute bronchitis, unspecified: Secondary | ICD-10-CM | POA: Insufficient documentation

## 2016-07-06 NOTE — Progress Notes (Signed)
   CC: Patient is here for blood pressure recheck.  HPI:  [name] is a 60 y.o. female with a past medical history of conditions listed below presenting to the clinic for blood pressure recheck. Patient was also noted to be coughing during this visit. Please see problem based charting for the status of the patient's current and chronic medical conditions.   Past Medical History:  Diagnosis Date  . Diabetes mellitus   . Ganglion cyst    right wrist  . GERD (gastroesophageal reflux disease)   . History of radiation therapy 02/27/10 -04/13/10   left base of tongue, bilat neck  . Hyperlipidemia   . Hypertension   . Iron deficiency anemia   . 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 Menopause   . Thyroid disease    Hyperthyroidism  . Tobacco abuse    PT HAS QUIT    Review of Systems:  Pertinent positives mentioned in HPI. Remainder of all ROS negative.   Physical Exam:  Vitals:   07/05/16 1039  BP: 133/77  Pulse: 81  Temp: 98 F (36.7 C)  TempSrc: Oral  SpO2: 100%  Weight: 133 lb 9.6 oz (60.6 kg)  Height: 5' 1.5" (1.562 m)   Physical Exam  Constitutional: She is oriented to person, place, and time. She appears well-developed and well-nourished. No distress.  HENT:  Head: Normocephalic and atraumatic.  Mouth/Throat: Oropharynx is clear and moist.  Eyes: EOM are normal. Right eye exhibits no discharge. Left eye exhibits no discharge.  Neck: Neck supple. No tracheal deviation present.  Cardiovascular: Normal rate, regular rhythm and intact distal pulses.   Pulmonary/Chest: Effort normal and breath sounds normal. No respiratory distress. She has no wheezes. She has no rales.  Abdominal: Soft. Bowel sounds are normal. She exhibits no distension. There is no tenderness. There is no guarding.  Musculoskeletal: Normal range of motion. She exhibits no deformity.  Neurological: She is alert and oriented to person, place, and time.  Skin: Skin is warm  and dry.    Assessment & Plan:   See Encounters Tab for problem based charting.  Patient discussed with Dr. Lynnae January

## 2016-07-06 NOTE — Assessment & Plan Note (Addendum)
BP Readings from Last 3 Encounters:  07/05/16 133/77  06/21/16 (!) 153/79  03/07/16 (!) 158/73    Lab Results  Component Value Date   NA 137 07/05/2016   K 4.6 07/05/2016   CREATININE 1.01 (H) 07/05/2016    Assessment: Blood pressure control:  below goal (less than 140/90).  Progress toward BP goal:   improved Comments: Currently taking amlodipine 10 mg daily, lisinopril 40 mg daily, metoprolol 50 mg twice daily, and spironolactone 25 mg daily. Patient did not bring her blood pressure log to this visit. Urine microalbumin to creatinine ratio was 4290.8 on 06/21/2016. Recent labs showing potassium 5.3, however, potassium checked at this visit was normal (4.6). Plan: Medications:  -Initial plan was to discontinue amlodipine (due to proteinuria) and instead start patient on a non-dihydropyridine calcium channel blocker (verapamil). However, I was informed by patient's pharmacy that both verapamil and diltiazem interact with simvastatin which the patient is currently taking (high risk of myopathy). Asked pharmacy to discard prescription for Verapamil. Tried calling the patient to inform her that amlodipine is to be continued for now but could not reach her over the phone. . Left a voicemail on 07/05/2016 at 12:04 pm asking the patient to call the clinic back. -Continue lisinopril, metoprolol, and spironolactone as above. Educational resources provided:   Educated patient about healthy eating and exercise. Emphasized the importance of weight loss.  Other plans:  -Repeat BMET in 3 months as patient is currently on both lisinopril and spironolactone which increases her risk for hyperkalemia.  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 I called the patient again today and was able to reach her over the phone. I discussed her proteinuria and recommended a few medication changes. Patient agreed with  the new treatment plan: -Discontinue Amlodipine -Discontinue Simvastatin  -Start Verapamil SR 120 mg daily -Start Pravastatin 40 mg daily

## 2016-07-06 NOTE — Assessment & Plan Note (Addendum)
History of present illness Patient was noted to be coughing during this visit. Reports coughing occasionally for the past 4 days; cough productive of white sputum. She is a former one third pack per day 18 years smoker; quit 5-6 years ago. Reports having stuffy nose. Denies having any fevers, chills, body aches, or pleuritic chest pain. Denies having any sneezing, sore throat, or sinus pressure.   Assessment Acute bronchitis. Lungs clear on exam.  Plan -Symptomatic treatment: Mucinex 600 mg twice daily

## 2016-07-08 ENCOUNTER — Encounter: Payer: Self-pay | Admitting: Internal Medicine

## 2016-07-08 DIAGNOSIS — N181 Chronic kidney disease, stage 1: Secondary | ICD-10-CM | POA: Insufficient documentation

## 2016-07-08 HISTORY — DX: Chronic kidney disease, stage 1: N18.1

## 2016-07-08 NOTE — Progress Notes (Signed)
nternal Medicine Clinic Attending  Case discussed with Dr. Marlowe Sax at the time of the visit.  We reviewed the resident's history and exam and pertinent patient test results.  I agree with the assessment, diagnosis, and plan of care documented in the resident's note. 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 It is not apparent that the pt has been informed of this (not on problem list until I added the dx today, no lab notes that pt was informed, no referrals to nephrology).   She needs informed of this dx and documentation that she was informed She needs informed of the connection btw poorly controlled DM and HTN and nephropathy Other etiologies of nephrotic range proteinuria need explored I strongly encouraged that her amlodipine be stopped (can worsen proteinuria) and that dilt or verapamil be substituted (can be beneficial in decreasing protein). This was not done at her last appt bc it interacts with her simva (for primary prevention only and LDL never higher than the 70's.) Simva can be stopped and pravastatin or crestor substituted if want to cont primary prevention with a statin.  I have requested a 2-4 week ACC F/U and Dec PCP F/U.

## 2016-07-09 MED ORDER — VERAPAMIL HCL ER 120 MG PO TBCR: 120.0000 mg | EXTENDED_RELEASE_TABLET | Freq: Every day | ORAL | 0 refills | Status: DC

## 2016-07-09 MED ORDER — PRAVASTATIN SODIUM 40 MG PO TABS: 40.0000 mg | ORAL_TABLET | Freq: Every evening | ORAL | 1 refills | Status: DC

## 2016-07-09 NOTE — Addendum Note (Signed)
Addended by: Shela Leff on: 07/09/2016 02:13 PM   Modules accepted: Orders, SmartSet

## 2016-07-19 ENCOUNTER — Ambulatory Visit (INDEPENDENT_AMBULATORY_CARE_PROVIDER_SITE_OTHER): Payer: Medicaid Other | Admitting: Internal Medicine

## 2016-07-19 VITALS — BP 148/81 | HR 75 | Temp 98.1°F | Ht 61.5 in | Wt 134.1 lb

## 2016-07-19 DIAGNOSIS — Z79899 Other long term (current) drug therapy: Secondary | ICD-10-CM | POA: Diagnosis not present

## 2016-07-19 DIAGNOSIS — I10 Essential (primary) hypertension: Secondary | ICD-10-CM | POA: Diagnosis not present

## 2016-07-19 DIAGNOSIS — Z87891 Personal history of nicotine dependence: Secondary | ICD-10-CM

## 2016-07-19 NOTE — Patient Instructions (Addendum)
Susan Evans it was nice seeing you today.  -STOP taking aspirin.  -Continue taking lisinopril, metoprolol, spironolactone, and verapamil for your blood pressure.  -Continue taking your diabetes medications as instructed.  -I advise you to prepare a pillbox on a weekly basis.  -Do not take any over-the-counter nonsteroidal anti-inflammatory drugs such as aspirin, ibuprofen (Motrin, Advil), or naproxen (Aleve).  -I have referred you to nephrology (kidney doctors). Our staff will set up the appointment for you.  -Return for a follow-up visit in 2 weeks. Please bring all your medications and blood pressure log to the visit.

## 2016-07-20 LAB — URINALYSIS, ROUTINE W REFLEX MICROSCOPIC
BILIRUBIN UA: NEGATIVE
KETONES UA: NEGATIVE
LEUKOCYTES UA: NEGATIVE
NITRITE UA: NEGATIVE
RBC UA: NEGATIVE
SPEC GRAV UA: 1.01 (ref 1.005–1.030)
Urobilinogen, Ur: 0.2 mg/dL (ref 0.2–1.0)
pH, UA: 6 (ref 5.0–7.5)

## 2016-07-20 LAB — MICROALBUMIN / CREATININE URINE RATIO
CREATININE, UR: 36.6 mg/dL
Microalb/Creat Ratio: 3432 mg/g creat — ABNORMAL HIGH (ref 0.0–30.0)
Microalbumin, Urine: 1256.1 ug/mL

## 2016-07-20 LAB — MICROSCOPIC EXAMINATION
Casts: NONE SEEN /lpf
EPITHELIAL CELLS (NON RENAL): NONE SEEN /HPF (ref 0–10)

## 2016-07-21 NOTE — Progress Notes (Signed)
   CC: Pt is here for a f/u of her HTN.   HPI:  Susan Evans is a 60 y.o. F with a PMHx of conditions listed below presenting to the clinic for a follow-up of HTN. Please see problem based charting for the status of the patient's current and chronic medical conditions.   Past Medical History:  Diagnosis Date  . Diabetes mellitus   . Ganglion cyst    right wrist  . GERD (gastroesophageal reflux disease)   . History of radiation therapy 02/27/10 -04/13/10   left base of tongue, bilat neck  . Hyperlipidemia   . Hypertension   . Iron deficiency anemia   . 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 Menopause   . Thyroid disease    Hyperthyroidism  . Tobacco abuse    PT HAS QUIT    Review of Systems:   Review of Systems  Constitutional: Negative for chills and fever.  Respiratory: Negative for shortness of breath.   Cardiovascular: Negative for chest pain.  Gastrointestinal: Negative for abdominal pain.    Physical Exam:  Vitals:   07/19/16 0942  BP: (!) 148/81  Pulse: 75  Temp: 98.1 F (36.7 C)  TempSrc: Oral  SpO2: 100%  Weight: 60.8 kg (134 lb 1.6 oz)  Height: 5' 1.5" (1.562 m)   Physical Exam  Assessment & Plan:   See Encounters Tab for problem based charting.  Patient discussed with Dr. Evette Doffing

## 2016-07-21 NOTE — Assessment & Plan Note (Addendum)
BP Readings from Last 3 Encounters:  07/19/16 (!) 148/81  07/05/16 133/77  06/21/16 (!) 153/79    Lab Results  Component Value Date   NA 137 07/05/2016   K 4.6 07/05/2016   CREATININE 1.01 (H) 07/05/2016    Assessment: Blood pressure control:  above goal (<140/90) Progress toward BP goal:   deteriorated  Comments: Current medication regimen includes lisinopril 40 mg daily, metoprolol 50 mg twice daily, spironolactone 25 mg daily, and verapamil CR 120 mg daily. Patient did not bring her medications or log to this visit. She seems to have poor health literacy - doesn't know what she is taking at home. Labs showing history of proteinuria in the past which has now progressed to nephrotic range: urine microalbumin/ cr ratio was 4,290.8 on 06/21/16. Amlodipine was discontinued during previous visit and patient was started on Verapamil. Repeat urine microglobulin/ cr ratio 3432.0 at this visit. UA negative for RBCs. I again expressed my concern for her proteinuria at this visit and advised patient to take her medications regularly. Explained to her it is a good idea for her to see Nephrology and patient agreed. Advised her to prepare a pillbox on a weekly basis. I offered to speak to social work about help with medication management at home but patient declined help.   Plan: Medications:  continue current medications. Advised her to avoid taking any OTC NSAIDs. Aspirin has been discontinued as she seems to be taking it for primary prevention; no absolute indication per chart.  Educational resources provided:   Educated patient about healthy eating and exercise. Other plans:  -Referral to nephrology -RTC in 2 weeks

## 2016-07-23 NOTE — Progress Notes (Signed)
Internal Medicine Clinic Attending  Case discussed with Dr. Rathoreat the time of the visit. We reviewed the resident's history and exam and pertinent patient test results. I agree with the assessment, diagnosis, and plan of care documented in the resident's note.  

## 2016-07-30 ENCOUNTER — Telehealth: Payer: Self-pay | Admitting: Dietician

## 2016-07-30 NOTE — Telephone Encounter (Signed)
Diabetes Educator called patient to follow up on her diabetes self care. She reports her blood sugars are better because she is eating a bit less. However, she says they are still going "up and down".She had a low blood sugar of 84 the other day when she skipped a meal. She says she feels pretty good when they are in the 90s. Follow up appointment scheduled for 08/05/16.

## 2016-07-30 NOTE — Telephone Encounter (Signed)
Thank you for your help.

## 2016-08-02 ENCOUNTER — Ambulatory Visit: Payer: Medicaid Other

## 2016-08-02 ENCOUNTER — Telehealth: Payer: Self-pay | Admitting: Internal Medicine

## 2016-08-02 NOTE — Telephone Encounter (Signed)
APT. REMINDER CALL, LMTCB °

## 2016-08-05 ENCOUNTER — Ambulatory Visit (INDEPENDENT_AMBULATORY_CARE_PROVIDER_SITE_OTHER): Payer: Medicaid Other | Admitting: Internal Medicine

## 2016-08-05 ENCOUNTER — Encounter: Payer: Self-pay | Admitting: Internal Medicine

## 2016-08-05 ENCOUNTER — Ambulatory Visit (INDEPENDENT_AMBULATORY_CARE_PROVIDER_SITE_OTHER): Payer: Medicaid Other | Admitting: Dietician

## 2016-08-05 VITALS — BP 133/71 | HR 71 | Temp 98.2°F | Ht 61.0 in | Wt 127.8 lb

## 2016-08-05 DIAGNOSIS — N181 Chronic kidney disease, stage 1: Secondary | ICD-10-CM

## 2016-08-05 DIAGNOSIS — Z79899 Other long term (current) drug therapy: Secondary | ICD-10-CM | POA: Diagnosis not present

## 2016-08-05 DIAGNOSIS — Z6824 Body mass index (BMI) 24.0-24.9, adult: Secondary | ICD-10-CM | POA: Diagnosis not present

## 2016-08-05 DIAGNOSIS — Z713 Dietary counseling and surveillance: Secondary | ICD-10-CM

## 2016-08-05 DIAGNOSIS — Z87891 Personal history of nicotine dependence: Secondary | ICD-10-CM

## 2016-08-05 DIAGNOSIS — I10 Essential (primary) hypertension: Secondary | ICD-10-CM

## 2016-08-05 DIAGNOSIS — I129 Hypertensive chronic kidney disease with stage 1 through stage 4 chronic kidney disease, or unspecified chronic kidney disease: Secondary | ICD-10-CM | POA: Diagnosis not present

## 2016-08-05 DIAGNOSIS — R808 Other proteinuria: Secondary | ICD-10-CM

## 2016-08-05 DIAGNOSIS — E119 Type 2 diabetes mellitus without complications: Secondary | ICD-10-CM

## 2016-08-05 DIAGNOSIS — E1121 Type 2 diabetes mellitus with diabetic nephropathy: Secondary | ICD-10-CM

## 2016-08-05 DIAGNOSIS — Z794 Long term (current) use of insulin: Secondary | ICD-10-CM

## 2016-08-05 DIAGNOSIS — E1122 Type 2 diabetes mellitus with diabetic chronic kidney disease: Secondary | ICD-10-CM

## 2016-08-05 DIAGNOSIS — E1165 Type 2 diabetes mellitus with hyperglycemia: Secondary | ICD-10-CM | POA: Diagnosis present

## 2016-08-05 LAB — GLUCOSE, CAPILLARY: Glucose-Capillary: 303 mg/dL — ABNORMAL HIGH (ref 65–99)

## 2016-08-05 MED ORDER — VERAPAMIL HCL ER 240 MG PO TBCR: 240.0000 mg | EXTENDED_RELEASE_TABLET | Freq: Every day | ORAL | 0 refills | Status: DC

## 2016-08-05 MED ORDER — GLUCOSE BLOOD VI STRP: 1.0000 | ORAL_STRIP | Freq: Every day | 11 refills | Status: DC

## 2016-08-05 NOTE — Assessment & Plan Note (Addendum)
BP Readings from Last 3 Encounters:  08/05/16 133/71  07/19/16 (!) 148/81  07/05/16 133/77    Lab Results  Component Value Date   NA 137 07/05/2016   K 4.6 07/05/2016   CREATININE 1.01 (H) 07/05/2016    Assessment: Blood pressure control:  Controlled Progress toward BP goal:   At goal  Comments:  Patient is on appropriate therapy  with first line for severe proteinuria with max lisinopril 40 mg daily and was started on Verapamil 120 mg daily at previous visit. She is also on metoprolol 50 mg BID and spironolactone 50 mg QD. BP is improved today to 133/71.  Plan: Medications:  I would favor more aggressive BP control in the setting of severe nephrotic range proteinuria. Increase Verapamil to 240 mg daily. Stop metoprolol given concern of two nodal blocking agents.  Other plans: Follow up in one month. She will likely need further titration of her verapamil or spironolactone to improve blood pressure control.

## 2016-08-05 NOTE — Progress Notes (Signed)
Diabetes Self-Management Education  Visit Type:  (P) Follow-up  Appt. Start Time: 915 Appt. End Time: 945  08/05/2016  [name], identified by name and date of birth, is a 60 y.o. female with a diagnosis of Diabetes- Type 2 diabetes  .   ASSESSMENT  Height 5\' 1"  (1.549 m), weight 127 lb 4.8 oz (57.7 kg). Body mass index is 24.05 kg/m.   Coordinated care with physician and patient who would like more strips to be abel to check her blood sugar before and after meals. Encouraged more vegetables and plant protein. She has not had any low blood sugars or symptoms on current doses of insulin. She will let us know if she wants to use the VGO- checking her blood sugar and giving injections is not bothering her.  She says she is taking her Novolog 15 minutes before meals and also says she takes her novolog about 1200-1230 then foes not eat until 2 or so depending on how it is running. Sh may wait longer after taking her Novolog when her blood sugar is higher as she mentioned she had had her am snack and this would be appropriate. She prefers her blood sugar to be ~ 180 at bedtime and is very fearful of low blood sugar- she may be eating defensively which is keeping her blood sugars above target. Continuous blood sugar monitoring would be ideal for her.        Diabetes Self-Management Education - 08/05/16 1000      Health Coping   How would you rate your overall health? (P)  Good     Subsequent Visit   Since your last visit have you continued or begun to take your medications as prescribed? (P)  No   Since your last visit have you had your blood pressure checked? (P)  Yes      Learning Objective:  Patient will have a greater understanding of diabetes self-management. Patient education plan is to attend individual and/or group sessions per assessed needs and concerns.   Plan:   Patient Instructions  Good job eating smaller potions and lowering your blood pressure and blood  sugar!!!  Please write down how much and when you take the lantus insulin and the Novolog insulin in the book provided.   If you don't want to take more NOVOLOG at breakfast you could consider not eating the 1/2 banana  Continue to: Try to eat 2- 3 carbohydrate servings for meals- breakfast, lunch and dinner  Try to eat 1 carbohydrate serving for snacks.  For example :     Breakfast : 1/2 banana, 1/2 cup oatmeal, 1/2 cup silk, decaf coffee = 3 carbohydrate servings Lunch: 2 slices bread for a sandwich, 1/2 cup unsweetened applesauce, water or diet ginger ale or green tea = 3 carbohydrate servings Supper: 1/2 cup pintos, 1/32 cup limas, 1/2 cup greens, 1/2 cup tomatoes, water or diet ginger ale  Snacks: 2 fresh figs OR 4 peanut butter crackers OR 1/2 cup unsweetened fruit OR 3-4 crackers and 1 slice Low salt low fat cheese  I will call you in 2 weeks and see you in 3 weeks.     Expected Outcomes:     Education material provided: Carbohydrate counting sheet  If problems or questions, patient to contact team via:  Phone  Future DSME appointment: -   3-4 weeks

## 2016-08-05 NOTE — Progress Notes (Signed)
Case discussed with Dr. Burns at the time of the visit. We reviewed the resident's history and exam and pertinent patient test results. I agree with the assessment, diagnosis, and plan of care documented in the resident's note. 

## 2016-08-05 NOTE — Assessment & Plan Note (Addendum)
Lab Results  Component Value Date   HGBA1C 9.0 06/21/2016   HGBA1C 8.5 03/07/2016   HGBA1C 8.9 10/27/2015     Assessment: Diabetes control:  Uncontrolled Progress toward A1C goal:    Improved Comments: Patient has a diagnosis of severe nephrotic range proteinuria as indicated by repeated microalbumin/creatinine ratio. Her last A1c was 9 on 9/29. At that time her metformin was increased to 1000 mg BID. Her glucose logs indicate that her average glucose is 181, average fasting 158, and range is 80 to 346. She is currently on metformin 1000 mg BID, Lantus 30 units BID, and Novolog 7 units in the morning, 10 units at lunch and 8 units before dinner. However, she does her own version of sliding scale insulin as she will take a lower dose of novolog at meal time if her blood glucose is low enough. It's hard to determine what exactly her cut off is, but it seems she will take a reduced amount if her blood glucose is less than 180 or so.   Plan: Medications:  continue current medications  Home glucose monitoring: Frequency:  QID Timing:  ACHS Instruction/counseling given: reminded to bring blood glucose meter & log to each visit and reminded to bring medications to each visit. Discussed importance of following recommended Novolog scale.  Other plans: Follow up in one month

## 2016-08-05 NOTE — Patient Instructions (Addendum)
Good job eating smaller potions and lowering your blood pressure and blood sugar!!!  Please write down how much and when you take the lantus insulin and the Novolog insulin in the book provided.   If you don't want to take more NOVOLOG at breakfast you could consider not eating the 1/2 banana  Continue to: Try to eat 2- 3 carbohydrate servings for meals- breakfast, lunch and dinner  Try to eat 1 carbohydrate serving for snacks.  For example :     Breakfast : 1/2 banana, 1/2 cup oatmeal, 1/2 cup silk, decaf coffee = 3 carbohydrate servings Lunch: 2 slices bread for a sandwich, 1/2 cup unsweetened applesauce, water or diet ginger ale or green tea = 3 carbohydrate servings Supper: 1/2 cup pintos, 1/32 cup limas, 1/2 cup greens, 1/2 cup tomatoes, water or diet ginger ale  Snacks: 2 fresh figs OR 3-4 peanut butter crackers OR 1/2 cup unsweetened fruit OR 3-4 crackers and 1 slice Low salt low fat cheese  I will call you in 2 weeks and see you in 3-4 weeks.

## 2016-08-05 NOTE — Patient Instructions (Signed)
FOR YOUR BLOOD PRESSURE: -TAKE VERAPAMIL 240 MG ONCE A DAY. YOU CAN TAKE 2 TABLETS OF THE 120 MG PILLS TO EQUAL 240 MG UNTIL YOU RUN OUT.  -STOP TAKING METOPROLOL  CONTINUE ALL OTHER MEDICATIONS THE SAME.   PLEASE RETURN IN 2-4 WEEKS FOR FOLLOW UP FOR BLOOD PRESSURE.

## 2016-08-05 NOTE — Progress Notes (Signed)
    CC: follow up for HTN  HPI: [name] is a 60 y.o. female with PMHx of T2DM, HTN, HLD, and Nephrotic Range Proteinuria who presents to the clinic for follow up for HTN. Please see problem based assessment and plan for more information.   Patient is eating and drinking well. She denies chest pain or shortness of breath.   Patient has a diagnosis of severe nephrotic range proteinuria as indicated by repeated microalbumin/creatinine ratio. UACR was 4,290 mg/g on 9/29 and 3,432 mg/g on 1027. Any value above 300 mg/g is indicative of severe progression. Patient is on appropriate therapy with first line max lisinopril 40 mg daily and was started on Verapamil 120 mg daily at previous visit. Proteinuria has been contributed to uncontrolled T2DM and HTN; however, overall neither of theses are wildly uncontrolled. BP is improved today to 133/71. Her last A1c was 9 on 9/29. At that time her metformin was increased to 1000 mg BID. Her glucose logs indicate that her average glucose is 181, average fasting 158, and range is 80 to 346. She is currently on metformin 1000 mg BID, Lantus 30 units BID, and Novolog 7 units in the morning, 10 units at lunch and 8 units before dinner. However, she does her own version of sliding scale insulin as she will take a lower dose of novolog at meal time if her blood glucose is low enough. It's hard to determine what exactly her cut off is, but it seems she will take a reduced amount if her blood glucose is less than 180 or so.   Past Medical History:  Diagnosis Date  . Diabetes mellitus   . Ganglion cyst    right wrist  . GERD (gastroesophageal reflux disease)   . History of radiation therapy 02/27/10 -04/13/10   left base of tongue, bilat neck  . Hyperlipidemia   . Hypertension   . Iron deficiency anemia   . 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 Menopause   . Thyroid disease    Hyperthyroidism  . Tobacco abuse    PT HAS QUIT     Review of Systems: Please see pertinent ROS reviewed in HPI and problem based charting.   Physical Exam: Vitals:   08/05/16 0929  BP: 133/71  Pulse: 71  Temp: 98.2 F (36.8 C)  TempSrc: Oral  SpO2: 100%  Weight: 127 lb 12.8 oz (58 kg)  Height: 5\' 1"  (1.549 m)   General: Vital signs reviewed.  Patient is well-developed and well-nourished, in no acute distress and cooperative with exam.  Neck: Stiff and tight skin s/p radiation. No carotid bruit present.  Cardiovascular: RRR, S1 normal, S2 normal, no murmurs, gallops, or rubs. Pulmonary/Chest: Clear to auscultation bilaterally, no wheezes, rales, or rhonchi. Abdominal: Soft, non-tender, non-distended, BS + Extremities: No lower extremity edema bilaterally Neurological: Awake and alert, moves all extremities.  Skin: Warm, dry and intact. No rashes or erythema.  Assessment & Plan:  See encounters tab for problem based medical decision making. Patient discussed with Dr. Eppie Gibson

## 2016-08-05 NOTE — Assessment & Plan Note (Addendum)
Patient has a diagnosis of severe nephrotic range proteinuria as indicated by repeated microalbumin/creatinine ratio. UACR was 4,290 mg/g on 9/29 and 3,432 mg/g on 1027. Any value above 300 mg/g is indicative of severe progression. Patient is on appropriate therapy with first line max lisinopril 40 mg daily and was started on Verapamil 120 mg daily at previous visit. Proteinuria has been contributed to uncontrolled T2DM and HTN; however, overall neither of theses are wildly uncontrolled. BP is improved today to 133/71. Her last A1c was 9 on 9/29. At that time her metformin was increased to 1000 mg BID. Her glucose logs indicate that her average glucose is 181, average fasting 158, and range is 80 to 346. She is currently on metformin 1000 mg BID, Lantus 30 units BID, and Novolog 7 units in the morning, 10 units at lunch and 8 units before dinner. Patient was seen by Nephrology who increased spironolactone from 25 mg QD to 50 mg QD.   Plan: -Increase Verapamil to 240 mg QD for more aggressive control (stop metoprolol given two nodal blocking agents) -Continue current diabetic regimen -Follow up in one month with PCP -Follow up with Nephrology in 6 months

## 2016-08-09 ENCOUNTER — Other Ambulatory Visit: Payer: Self-pay | Admitting: Nephrology

## 2016-08-09 DIAGNOSIS — N189 Chronic kidney disease, unspecified: Secondary | ICD-10-CM

## 2016-08-18 ENCOUNTER — Other Ambulatory Visit: Payer: Self-pay | Admitting: Internal Medicine

## 2016-08-19 ENCOUNTER — Ambulatory Visit
Admission: RE | Admit: 2016-08-19 | Discharge: 2016-08-19 | Disposition: A | Discharge status: Home / Self Care | Payer: Medicaid Other | Source: Ambulatory Visit | Attending: Nephrology | Admitting: Nephrology

## 2016-08-19 DIAGNOSIS — N189 Chronic kidney disease, unspecified: Secondary | ICD-10-CM

## 2016-08-22 ENCOUNTER — Encounter: Payer: Self-pay | Admitting: Internal Medicine

## 2016-08-24 ENCOUNTER — Other Ambulatory Visit: Payer: Self-pay | Admitting: Internal Medicine

## 2016-08-26 ENCOUNTER — Encounter: Payer: Medicaid Other | Admitting: Internal Medicine

## 2016-08-30 ENCOUNTER — Encounter: Payer: Medicaid Other | Admitting: Internal Medicine

## 2016-08-30 ENCOUNTER — Ambulatory Visit: Payer: Medicaid Other | Admitting: Dietician

## 2016-09-05 ENCOUNTER — Other Ambulatory Visit: Payer: Self-pay

## 2016-09-05 DIAGNOSIS — I10 Essential (primary) hypertension: Secondary | ICD-10-CM

## 2016-09-05 NOTE — Telephone Encounter (Signed)
spironolactone (ALDACTONE) 25 MG tablet, Refill request @ [facility] on [address]. Please call pt back.

## 2016-09-05 NOTE — Telephone Encounter (Signed)
Contacted pharmacy as pt should have refills available.  Per pharmacy, pt received 90-day supply on 06/21/16-pt will able to get refill on 12/29.  Attempted to call pt to inform her, no answer, message left on recorder.Susan Hidden Cassady12/14/201710:47 AM

## 2016-10-28 ENCOUNTER — Other Ambulatory Visit: Payer: Self-pay | Admitting: Internal Medicine

## 2016-10-28 DIAGNOSIS — I10 Essential (primary) hypertension: Secondary | ICD-10-CM

## 2016-11-05 ENCOUNTER — Encounter: Payer: Medicaid Other | Admitting: Internal Medicine

## 2016-11-08 ENCOUNTER — Other Ambulatory Visit (HOSPITAL_COMMUNITY)
Admission: RE | Admit: 2016-11-08 | Discharge: 2016-11-08 | Disposition: A | Discharge status: Home / Self Care | Payer: Medicaid Other | Source: Ambulatory Visit | Attending: Internal Medicine | Admitting: Internal Medicine

## 2016-11-08 ENCOUNTER — Ambulatory Visit: Payer: Medicaid Other | Admitting: Dietician

## 2016-11-08 ENCOUNTER — Encounter: Payer: Self-pay | Admitting: *Deleted

## 2016-11-08 ENCOUNTER — Ambulatory Visit (INDEPENDENT_AMBULATORY_CARE_PROVIDER_SITE_OTHER): Payer: Medicaid Other | Admitting: Internal Medicine

## 2016-11-08 ENCOUNTER — Encounter (INDEPENDENT_AMBULATORY_CARE_PROVIDER_SITE_OTHER): Payer: Self-pay

## 2016-11-08 VITALS — BP 143/68 | HR 94 | Temp 98.4°F | Wt 132.7 lb

## 2016-11-08 DIAGNOSIS — E1165 Type 2 diabetes mellitus with hyperglycemia: Secondary | ICD-10-CM | POA: Diagnosis present

## 2016-11-08 DIAGNOSIS — I129 Hypertensive chronic kidney disease with stage 1 through stage 4 chronic kidney disease, or unspecified chronic kidney disease: Secondary | ICD-10-CM

## 2016-11-08 DIAGNOSIS — E785 Hyperlipidemia, unspecified: Secondary | ICD-10-CM

## 2016-11-08 DIAGNOSIS — Z124 Encounter for screening for malignant neoplasm of cervix: Secondary | ICD-10-CM | POA: Insufficient documentation

## 2016-11-08 DIAGNOSIS — Z0000 Encounter for general adult medical examination without abnormal findings: Secondary | ICD-10-CM

## 2016-11-08 DIAGNOSIS — E1122 Type 2 diabetes mellitus with diabetic chronic kidney disease: Secondary | ICD-10-CM

## 2016-11-08 DIAGNOSIS — Z794 Long term (current) use of insulin: Secondary | ICD-10-CM

## 2016-11-08 DIAGNOSIS — R808 Other proteinuria: Secondary | ICD-10-CM | POA: Diagnosis not present

## 2016-11-08 DIAGNOSIS — I10 Essential (primary) hypertension: Secondary | ICD-10-CM

## 2016-11-08 DIAGNOSIS — Z79899 Other long term (current) drug therapy: Secondary | ICD-10-CM | POA: Diagnosis not present

## 2016-11-08 DIAGNOSIS — E1129 Type 2 diabetes mellitus with other diabetic kidney complication: Secondary | ICD-10-CM

## 2016-11-08 DIAGNOSIS — Z87891 Personal history of nicotine dependence: Secondary | ICD-10-CM | POA: Diagnosis not present

## 2016-11-08 DIAGNOSIS — E119 Type 2 diabetes mellitus without complications: Secondary | ICD-10-CM

## 2016-11-08 DIAGNOSIS — N181 Chronic kidney disease, stage 1: Secondary | ICD-10-CM

## 2016-11-08 DIAGNOSIS — Z1211 Encounter for screening for malignant neoplasm of colon: Secondary | ICD-10-CM

## 2016-11-08 LAB — GLUCOSE, CAPILLARY: Glucose-Capillary: 160 mg/dL — ABNORMAL HIGH (ref 65–99)

## 2016-11-08 LAB — POCT GLYCOSYLATED HEMOGLOBIN (HGB A1C): Hemoglobin A1C: 8.8

## 2016-11-08 MED ORDER — LISINOPRIL 40 MG PO TABS: ORAL_TABLET | ORAL | 1 refills | Status: DC

## 2016-11-08 MED ORDER — PRAVASTATIN SODIUM 40 MG PO TABS: 40.0000 mg | ORAL_TABLET | Freq: Every evening | ORAL | 1 refills | Status: DC

## 2016-11-08 MED ORDER — OMEPRAZOLE 20 MG PO CPDR: DELAYED_RELEASE_CAPSULE | ORAL | 5 refills | Status: DC

## 2016-11-08 MED ORDER — ACCU-CHEK SOFTCLIX LANCETS MISC
7 refills | Status: DC
Start: 2016-11-08 — End: 2017-10-10

## 2016-11-08 MED ORDER — VERAPAMIL HCL ER 240 MG PO TBCR: 240.0000 mg | EXTENDED_RELEASE_TABLET | Freq: Every day | ORAL | 2 refills | Status: DC

## 2016-11-08 MED ORDER — INSULIN PEN NEEDLE 31G X 5 MM MISC: 12 refills | Status: DC

## 2016-11-08 MED ORDER — "INSULIN SYRINGE-NEEDLE U-100 31G X 15/64"" 0.5 ML MISC": 7 refills | Status: DC

## 2016-11-08 MED ORDER — VERAPAMIL HCL ER 240 MG PO TBCR: 360.0000 mg | EXTENDED_RELEASE_TABLET | Freq: Every day | ORAL | 2 refills | Status: DC

## 2016-11-08 NOTE — Assessment & Plan Note (Signed)
She was due for her Pap smear and colonoscopy.  Pap smear was done today.  Referral for colonoscopy provided.

## 2016-11-08 NOTE — Patient Instructions (Addendum)
Thank you for visiting clinic today. Your blood pressure was high today. I am increasing your verapamil to 360 mg daily. You can use 1-1/2 tablet of  240 mg you have , until you run out. You can get a new prescription. Your A1c today was 8.8 remained high. You need adjustment in your insulin regimen, please bring your glucometer meter with you during your next visit so I can make necessary changes. As we discussed regarding this new continuous glucose monitoring device, you can think over it and talk with Butch Penny during your next visit with her.

## 2016-11-08 NOTE — Assessment & Plan Note (Signed)
Lipid Panel     Component Value Date/Time   CHOL 148 06/21/2016 1346   TRIG 380 (H) 06/21/2016 1346   HDL 29 (L) 06/21/2016 1346   CHOLHDL 5.1 (H) 06/21/2016 1346   CHOLHDL 4.5 08/15/2014 1406   VLDL 46 (H) 08/15/2014 1406   LDLCALC 43 06/21/2016 1346   -Continue Pravachol 40 mg daily. -Repeat check lipid panel in September 2018.

## 2016-11-08 NOTE — Assessment & Plan Note (Signed)
Lab Results  Component Value Date   HGBA1C 8.8 11/08/2016   Mildly decreased from her previous A1c of 9.0. She forget to bring her glucometer meter. She is way above her goal, I would like to bring her close to 6 because of her nephrotic range protein urea and to prevent escalation of her chronic kidney disease. She states that whenever she checked her blood sugar at home it weight is between 139 - 180. No documentation.  I talked with patient regarding of her new continuous monitoring device, she seems interested, she states that she will think over it.  P. Continue current regimen of Lantus, NovoLog and metformin. Referral to Butch Penny for diabetic education and to see if she qualified for continuous monitoring device. She was advised to bring her glucometer during her next visit in 1 month so we can adjust her insulin.

## 2016-11-08 NOTE — Progress Notes (Signed)
   CC: For follow-up of her uncontrolled diabetes and blood pressure.  HPI:  Susan Evans is a 61 y.o. with past medical history as listed below came to the clinic for follow-up of her uncontrolled diabetes and hypertension. She did not bring her glucometer meter today.  She has no complaints.  She was due for her Pap smear and colonoscopy. Pap smear was done today. Referral for colonoscopy given.  Past Medical History:  Diagnosis Date  . Diabetes mellitus   . Ganglion cyst    right wrist  . GERD (gastroesophageal reflux disease)   . History of radiation therapy 02/27/10 -04/13/10   left base of tongue, bilat neck  . Hyperlipidemia   . Hypertension   . Iron deficiency anemia   . 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 Menopause   . Thyroid disease    Hyperthyroidism  . Tobacco abuse    PT HAS QUIT    Review of Systems:  AS per HPI.  Physical Exam:   Vitals:   11/08/16 1406 11/08/16 1458  BP: (!) 168/66 (!) 143/68  Pulse: 94 94  Temp: 98.4 F (36.9 C)   TempSrc: Oral   SpO2: 99%   Weight: 132 lb 11.2 oz (60.2 kg)    General: Vital signs reviewed.  Patient is well-developed and well-nourished, in no acute distress and cooperative with exam.  Head: Normocephalic and atraumatic. Eyes: EOMI, conjunctivae normal, no scleral icterus.  Neck: Supple, trachea midline, normal ROM, no JVD, masses, thyromegaly, or carotid bruit present.  Cardiovascular: RRR, S1 normal, S2 normal, no murmurs, gallops, or rubs. Pulmonary/Chest: Clear to auscultation bilaterally, no wheezes, rales, or rhonchi. Abdominal: Soft, non-tender, non-distended, BS +, no masses, organomegaly, or guarding present.  Extremities: No lower extremity edema bilaterally,  pulses symmetric and intact bilaterally. No cyanosis or clubbing. Neurological: A&O x3, Strength is normal and symmetric bilaterally, cranial nerve II-XII are grossly intact, no focal motor deficit, sensory intact to  light touch bilaterally.  Skin: Warm, dry and intact. No rashes or erythema. Psychiatric: Normal mood and affect. speech and behavior is normal. Cognition and memory are normal.  Assessment & Plan:   See Encounters Tab for problem based charting.  Patient discussed with Dr. Lynnae January

## 2016-11-08 NOTE — Assessment & Plan Note (Signed)
She has nephrotic range protein urea. Follow-up with nephrology every 6 month.  -Recheck albumin creatinine ratio. -She needs tighter control of her diabetes and hypertension.

## 2016-11-08 NOTE — Assessment & Plan Note (Addendum)
BP Readings from Last 3 Encounters:  11/08/16 (!) 143/68  08/05/16 133/71  07/19/16 (!) 148/81   Initially her blood pressure was 168/66, decreased to 143/68 on subsequent reading.Heart rate was 94. Her verapamil was increased to 240 mg daily during her last visit 3 months ago. She needs more tighter control because of her nephrotic range protein urea. P. Her verapamil was increased to 360 mg daily. Continue lisinopril at 40 Daily. Continue spironolactone. Follow-up in one month. BMP to follow on potassium as patient is on lisinopril and spironolactone.

## 2016-11-09 LAB — MICROALBUMIN / CREATININE URINE RATIO
CREATININE, UR: 65.2 mg/dL
MICROALBUM., U, RANDOM: 841.1 ug/mL
Microalb/Creat Ratio: 1290 mg/g creat — ABNORMAL HIGH (ref 0.0–30.0)

## 2016-11-09 LAB — BMP8+ANION GAP
Anion Gap: 22 mmol/L — ABNORMAL HIGH (ref 10.0–18.0)
BUN/Creatinine Ratio: 16 (ref 12–28)
BUN: 18 mg/dL (ref 8–27)
CO2: 18 mmol/L (ref 18–29)
CREATININE: 1.15 mg/dL — AB (ref 0.57–1.00)
Calcium: 9.5 mg/dL (ref 8.7–10.3)
Chloride: 100 mmol/L (ref 96–106)
GFR calc Af Amer: 60 mL/min/{1.73_m2} (ref >59)
GFR calc non Af Amer: 52 mL/min/{1.73_m2} — ABNORMAL LOW (ref >59)
Glucose: 134 mg/dL — ABNORMAL HIGH (ref 65–99)
Potassium: 4.4 mmol/L (ref 3.5–5.2)
Sodium: 140 mmol/L (ref 134–144)

## 2016-11-11 LAB — CYTOLOGY - PAP: Diagnosis: NEGATIVE

## 2016-11-11 NOTE — Progress Notes (Signed)
Internal Medicine Clinic Attending  Case discussed with Dr. Amin at the time of the visit.  We reviewed the resident's history and exam and pertinent patient test results.  I agree with the assessment, diagnosis, and plan of care documented in the resident's note.    

## 2016-11-18 ENCOUNTER — Ambulatory Visit (INDEPENDENT_AMBULATORY_CARE_PROVIDER_SITE_OTHER): Payer: Medicaid Other | Admitting: Cardiology

## 2016-11-18 ENCOUNTER — Encounter (INDEPENDENT_AMBULATORY_CARE_PROVIDER_SITE_OTHER): Payer: Self-pay

## 2016-11-18 ENCOUNTER — Encounter: Payer: Self-pay | Admitting: Cardiology

## 2016-11-18 VITALS — BP 122/80 | HR 98 | Ht 62.0 in | Wt 130.8 lb

## 2016-11-18 DIAGNOSIS — I10 Essential (primary) hypertension: Secondary | ICD-10-CM

## 2016-11-18 DIAGNOSIS — R002 Palpitations: Secondary | ICD-10-CM | POA: Diagnosis not present

## 2016-11-18 DIAGNOSIS — E05 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: Secondary | ICD-10-CM

## 2016-11-18 NOTE — Progress Notes (Signed)
Cardiology Office Note    Date:  11/18/2016   ID:  Susan Evans, DOB [birth year], MRN [id number]  PCP:  [name], MD  Cardiologist:   Candee Furbish, MD     History of Present Illness:  Susan Evans is a 61 y.o. female with diabetes, hypertension, hyperlipidemia Here for follow-up of palpitations. When we saw each other last year she had gone to the emergency department where she felt her heart beating very quickly. When she was in the emergency room her heart rate was normal and EKG was unremarkable.  She denies any chest pain, syncope, bleeding. Very rarely does she ever feel palpitations now.  She underwent an echocardiogram which was unremarkable except for mild aortic regurgitation which should be of no clinical consequence. An event monitor was also done which showed no evidence of atrial fibrillation or PVCs. Reassuring.  The way she previously described are PVCs, she felt it in her neck, left sided, moved around at times. Felt like her heart was going to jump out of her chest sometimes worse at night.  In the past had been treated for Graves' disease. Endocrinology has been monitoring her closely. Previous T4 was 0.58.   Past Medical History:  Diagnosis Date  . Diabetes mellitus   . Ganglion cyst    right wrist  . GERD (gastroesophageal reflux disease)   . History of radiation therapy 02/27/10 -04/13/10   left base of tongue, bilat neck  . Hyperlipidemia   . Hypertension   . Iron deficiency anemia   . 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 Menopause   . Thyroid disease    Hyperthyroidism  . Tobacco abuse    PT HAS QUIT    Past Surgical History:  Procedure Laterality Date  . NECK SURGERY      Outpatient Medications Prior to Visit  Medication Sig Dispense Refill  . ACCU-CHEK SOFTCLIX LANCETS lancets Check blood sugar 4 times a day 200 each 7  . Blood Glucose Monitoring Suppl (ACCU-CHEK AVIVA PLUS) w/Device KIT Use to test blood  glucose  3 times daily 1 kit 0  . ferrous sulfate 325 (65 FE) MG tablet Take 325 mg by mouth daily with breakfast.    . glucose blood (ACCU-CHEK AVIVA PLUS) test strip 1 each by Other route 5 (five) times daily. 150 each 11  . insulin aspart (NOVOLOG FLEXPEN) 100 UNIT/ML FlexPen Inject 7 units with breakfast, 10 with lunch, and 8 with dinner 15 mL 5  . Insulin Pen Needle (B-D UF III MINI PEN NEEDLES) 31G X 5 MM MISC Use to inject Novolog 3 times a day 100 each 12  . Insulin Syringe-Needle U-100 31G X 15/64" 0.5 ML MISC Use to inject lantus insulin two times a day 100 each 7  . LANTUS 100 UNIT/ML injection INJECT 30 UNITS UNDER THE SKIN IN THE MORNING AND 30 UNITS IN THE EVENING. 20 mL 2  . lisinopril (PRINIVIL,ZESTRIL) 40 MG tablet 0000000000000000000000000000000000000000 TAKE 1 TABLET (40 MG TOTAL) BY MOUTH DAILY. 90 tablet 1  . loratadine (CLARITIN) 10 MG tablet Take 1 tablet (10 mg total) by mouth daily. 30 tablet 2  . metFORMIN (GLUCOPHAGE) 1000 MG tablet TAKE 1 TABLET (1,000 MG TOTAL) BY MOUTH 2 (TWO) TIMES DAILY WITH A MEAL. 60 tablet 3  . omeprazole (PRILOSEC) 20 MG capsule TAKE 1 CAPSULE (20 MG TOTAL) BY MOUTH 2 (TWO) TIMES DAILY BEFORE A MEAL. 60 capsule 5  . Polyethyl Glycol-Propyl Glycol (SYSTANE OP) Apply  1 drop to eye daily as needed (for dry eyes).    . pravastatin (PRAVACHOL) 40 MG tablet Take 1 tablet (40 mg total) by mouth every evening. 90 tablet 1  . spironolactone (ALDACTONE) 25 MG tablet Take 1 tablet (25 mg total) by mouth daily. 90 tablet 3  . verapamil (CALAN-SR) 240 MG CR tablet Take 1.5 tablets (360 mg total) by mouth daily. 120 tablet 2   No facility-administered medications prior to visit.      Allergies:   Hydrochlorothiazide and Penicillins   Social History   Social History  . Marital status: Single    Spouse name: N/A  . Number of children: N/A  . Years of education: 61   Occupational History  .  Unemployed  .  Unemployed   Social History Main Topics  .  Smoking status: Former Smoker    Packs/day: 0.00    Quit date: 02/09/2009  . Smokeless tobacco: Never Used  . Alcohol use No  . Drug use: No  . Sexual activity: Yes   Other Topics Concern  . Not on file   Social History Narrative  . No narrative on file     Family History:  The patient's family history includes Cancer in her father and paternal aunt; Diabetes in her mother; Hypertension in her mother; Stroke in her father. Father had enlarged heart.   ROS:   Please see the history of present illness.    ROS Liopoma. No syncope no fevers, no orthopnea, no significant shortness of breath. All other systems reviewed and are negative.   PHYSICAL EXAM:   VS:  BP 122/80   Pulse 98   Ht _0  (1.575 m)   Wt 130 lb 12.8 oz (59.3 kg)   BMI 23.92 kg/m    GEN: Well nourished, well developed, in no acute distress  HEENT: normal  Neck: no JVD, left neck carotid bruit, or masses Cardiac: RRR; no murmurs, rubs, or gallops,no edema  Respiratory:  clear to auscultation bilaterally, normal work of breathing GI: soft, nontender, nondistended, + BS MS: no deformity or atrophy  Skin: warm and dry, no rash Neuro:  Alert and Oriented x 3, Strength and sensation are intact Psych: euthymic mood, full affect  Wt Readings from Last 3 Encounters:  11/18/16 130 lb 12.8 oz (59.3 kg)  11/08/16 132 lb 11.2 oz (60.2 kg)  08/05/16 127 lb 12.8 oz (58 kg)      Studies/Labs Reviewed:   EKG:  EKG is ordered today.  The ekg ordered today demonstrates 11/18/16-sinus rhythm, no other abnormalities, personally viewed-prior 11/13/15-sinus rhythm, heart rate 76 bpm, septal infarct pattern, personally viewed  Recent Labs: 11/08/2016: BUN 18; Creatinine, Ser 1.15; Potassium 4.4; Sodium 140   Lipid Panel    Component Value Date/Time   CHOL 148 06/21/2016 1346   TRIG 380 (H) 06/21/2016 1346   HDL 29 (L) 06/21/2016 1346   CHOLHDL 5.1 (H) 06/21/2016 1346   CHOLHDL 4.5 08/15/2014 1406   VLDL 46 (H)  08/15/2014 1406   LDLCALC 43 06/21/2016 1346    Additional studies/ records that were reviewed today include:  ER visit reviewed, lab work reviewed, prior EKG reviewed  Event monitor 11/21/15  No adverse arrhythmias  no atrial fibrillation  no pauses, No PVCs   Reassuring heart monitor. Candee Furbish, MD  Echocardiogram 11/21/15 - Left ventricle: The cavity size was normal. Wall thickness was   normal. Systolic function was normal. The estimated ejection   fraction was in the range  of 60% to 65%. Wall motion was normal;   there were no regional wall motion abnormalities. - Aortic valve: There was mild regurgitation. - Left atrium: The atrium was mildly dilated.  ASSESSMENT:    1. Essential hypertension      PLAN:  In order of problems listed above:  1. Palpitations-strange sensation for her. She feels it in her neck. Differential includes PVC/PACs. Event monitor did not show any atrial fibrillation, no PVCs. Continue with verapamil. 2. Carotid bruit-left sided. 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 3. Diabetes-continue to encourage control.  4. Graves' disease/hyperthyroidism-currently on methimazole. Excellent. Followed by endocrinology.   We will be able to see back on as-needed basis. Please let us know if we can be of further assistance.   Medication Adjustments/Labs and Tests Ordered: Current medicines are reviewed at length with the patient today.  Concerns regarding medicines are outlined above.  Medication changes, Labs and Tests ordered today are listed in the Patient Instructions below. Patient Instructions  Medication Instructions:  The current medical regimen is effective;  continue present plan and medications.  Follow-Up: Follow up as needed with Dr Marlou Porch.  Thank you for choosing [facility]!!          Signed, Candee Furbish, MD  11/18/2016 11:15 AM    [facility] [address], [place], [place]  [address] Phone: [phone]; Fax: [phone]

## 2016-11-18 NOTE — Patient Instructions (Signed)
Medication Instructions:  The current medical regimen is effective;  continue present plan and medications.  Follow-Up: Follow up as needed with Dr Skains.  Thank you for choosing [facility]!!     

## 2016-11-28 ENCOUNTER — Other Ambulatory Visit: Payer: Self-pay | Admitting: Internal Medicine

## 2016-11-28 ENCOUNTER — Telehealth: Payer: Self-pay | Admitting: Dietician

## 2016-11-28 DIAGNOSIS — E119 Type 2 diabetes mellitus without complications: Secondary | ICD-10-CM

## 2016-11-28 NOTE — Telephone Encounter (Signed)
Called patient to follow up on her goals from her last visit and also her diabetes self care. She did not want to talk and said she'd see [place] next week. Confirmed both her upcoming appointments for 12/06/16 and 12/13/16.

## 2016-12-03 NOTE — Addendum Note (Signed)
Addended by: Hulan Fray on: 12/03/2016 06:48 PM   Modules accepted: Orders

## 2016-12-06 ENCOUNTER — Ambulatory Visit: Payer: Medicaid Other

## 2016-12-13 ENCOUNTER — Ambulatory Visit (INDEPENDENT_AMBULATORY_CARE_PROVIDER_SITE_OTHER): Payer: Medicaid Other | Admitting: Dietician

## 2016-12-13 DIAGNOSIS — Z794 Long term (current) use of insulin: Secondary | ICD-10-CM

## 2016-12-13 DIAGNOSIS — E119 Type 2 diabetes mellitus without complications: Secondary | ICD-10-CM

## 2016-12-13 DIAGNOSIS — Z713 Dietary counseling and surveillance: Secondary | ICD-10-CM | POA: Diagnosis not present

## 2016-12-13 NOTE — Progress Notes (Signed)
Diabetes Self-Management Education  Visit Type:  Follow-up  Appt. Start Time: 1000 Appt. End Time: 1038  12/13/2016  [name], identified by name and date of birth, is a 61 y.o. female with a diagnosis of Diabetes:  .   ASSESSMENT She says she increased her lantus to 35 units and dropped to 80-90 blood sugar (at which she was uncomfortable) at night when she was sleeping, so she went back to using 30 unit s lantus BID. We discussed importance of getting A1C under better control to care for kidney care and how eating more fiber could be helpful.   Weight 131 lb 6.4 oz (59.6 kg). Body mass index is 24.03 kg/m.       Diabetes Self-Management Education - 12/13/16 1200      Health Coping   How would you rate your overall health? Good     Psychosocial Assessment   Patient Belief/Attitude about Diabetes Motivated to manage diabetes  afraid and motivated at the same time   Self-care barriers Lack of transportation;Lack of material resources   Self-management support Doctor's office;Family;CDE visits   Patient Concerns Glycemic Control   Special Needs None   Preferred Learning Style No preference indicated   Learning Readiness Ready     Complications   Last HgB A1C per patient/outside source 8.8 %   Have you had a dilated eye exam in the past 12 months? Yes   Have you had a dental exam in the past 12 months? No  she does not have any teeth   Are you checking your feet? Yes     Dietary Intake   Breakfast oatmeal, 1/2 banana, coffee, 1/2 cup silk   Snack (morning) grapes   Lunch sandwich   Snack (afternoon) orange   Dinner stew beef, green beans, bread   Beverage(s) water     Exercise   Exercise Type ADL's;Light (walking / raking leaves)     Patient Education   Monitoring Taught/evaluated SMBG meter.;Other (comment)   Personal strategies to promote health Lifestyle issues that need to be addressed for better diabetes care;Helped patient develop diabetes management  plan for (enter comment)     Individualized Goals (developed by patient)   Monitoring  Other (comment)   Health Coping ask for help with (comment)     Patient Self-Evaluation of Goals - Patient rates self as meeting previously set goals (% of time)   Nutrition >75%   Health Coping 50 - 75 %     Outcomes   Program Status Completed     Subsequent Visit   Since your last visit have you continued or begun to take your medications as prescribed? Yes   Since your last visit have you had your blood pressure checked? No   Since your last visit have you experienced any weight changes? No change   Since your last visit, are you checking your blood glucose at least once a day? Yes      Learning Objective:  Patient will have a greater understanding of diabetes self-management. Patient education plan is to attend individual and/or group sessions per assessed needs and concerns.   Plan:   Patient Instructions  Your blood  sugar was 94 today 2.5 hours after eating breakfast (coffee, oatmeal and 1/2 banana)  Healthy peanut butter is NATURAL PEANUT BUTTER and only has two ingredients- peanuts and salt  Healthy crackers are whole grain, with little fat and salt, WASA LIGHT RYE crispbread. And TRiscuits are also okay. But higher in  salt and fat.  Pick up your meter and test blood sugar 4 times day and bring it back for [place] to look at.   Goal  for blood sugars: Before meal 90-130 No higher than 160-180 At bedtime 120-160  Call me please if you do not get your meter.     Expected Outcomes:  Demonstrated interest in learning. Expect positive outcomes  Education material provided: Support group flyer  If problems or questions, patient to contact team via:  Phone  Future DSME appointment: - 4-6 wks  Candice Tobey, Butch Penny, [place] 12/13/2016 3:25 PM.

## 2016-12-13 NOTE — Telephone Encounter (Signed)
Patient needs new glucometer. Left hers in [place]. Gave her a coupon for a free meter.

## 2016-12-13 NOTE — Patient Instructions (Addendum)
Your blood  sugar was 94 today 2.5 hours after eating breakfast (coffee, oatmeal and 1/2 banana)  Healthy peanut butter is NATURAL PEANUT BUTTER and only has two ingredients- peanuts and salt  Healthy crackers are whole grain, with little fat and salt, WASA LIGHT RYE crispbread. And TRiscuits are also okay. But higher in salt and fat.  Pick up your meter and test blood sugar 4 times day and bring it back for [place] to look at.   Goal  for blood sugars: Before meal 90-130 No higher than 160-180 At bedtime 120-160  Call me please if you do not get your meter.

## 2016-12-15 MED ORDER — ACCU-CHEK AVIVA PLUS W/DEVICE KIT: PACK | 0 refills | Status: DC

## 2017-01-01 ENCOUNTER — Telehealth: Payer: Self-pay | Admitting: Dietician

## 2017-01-01 NOTE — Telephone Encounter (Signed)
Susan Evans called to change appointment to tow weeks from now.

## 2017-01-03 ENCOUNTER — Ambulatory Visit: Payer: Medicaid Other | Admitting: Dietician

## 2017-01-16 ENCOUNTER — Telehealth: Payer: Self-pay | Admitting: Dietician

## 2017-01-16 ENCOUNTER — Ambulatory Visit: Payer: Medicaid Other | Admitting: Dietician

## 2017-01-16 NOTE — Telephone Encounter (Signed)
Left message for return call to reschedule missed appointment

## 2017-01-23 NOTE — Telephone Encounter (Addendum)
Called [facility] to see how many strips patient has been getting:on 4/4 Susan Evans got 150 strips- enough to est before and after meals. Called patient and discussed that she should have plenty of strips for checking b,ood sugar before and after meals

## 2017-01-23 NOTE — Telephone Encounter (Addendum)
Called to follow up on diabetes self care sicne patient missed her last appointment. She said she has had one low blood sugar, but she treated it them had a mel and it was fine.  She stated her target before meal blood sugar is   100-140, and 2 hours after is <180. She bought whole grain crackers and is enjoying them. She does not use salt, she uses No salt. She states that she knows her colonoscopy is due but wants to get other appointments out of the way before she schedules it.  She went to the eye doctor, has a kidney and cancer doctor appointments coming up.  She reschedule her appointment for diabetes self management training & support for June. She would like to check her blood sugar after meals more often, but says she runs short on testing supplies. Marland Kitchen

## 2017-01-29 ENCOUNTER — Other Ambulatory Visit: Payer: Self-pay | Admitting: Internal Medicine

## 2017-01-31 ENCOUNTER — Encounter (INDEPENDENT_AMBULATORY_CARE_PROVIDER_SITE_OTHER): Payer: Self-pay

## 2017-01-31 ENCOUNTER — Ambulatory Visit (INDEPENDENT_AMBULATORY_CARE_PROVIDER_SITE_OTHER): Payer: Medicaid Other | Admitting: Internal Medicine

## 2017-01-31 ENCOUNTER — Encounter: Payer: Self-pay | Admitting: Internal Medicine

## 2017-01-31 VITALS — BP 117/69 | HR 90 | Temp 98.4°F | Wt 131.8 lb

## 2017-01-31 DIAGNOSIS — Z794 Long term (current) use of insulin: Secondary | ICD-10-CM

## 2017-01-31 DIAGNOSIS — E1122 Type 2 diabetes mellitus with diabetic chronic kidney disease: Secondary | ICD-10-CM

## 2017-01-31 DIAGNOSIS — N181 Chronic kidney disease, stage 1: Secondary | ICD-10-CM

## 2017-01-31 DIAGNOSIS — M810 Age-related osteoporosis without current pathological fracture: Secondary | ICD-10-CM

## 2017-01-31 DIAGNOSIS — Z87891 Personal history of nicotine dependence: Secondary | ICD-10-CM | POA: Diagnosis not present

## 2017-01-31 DIAGNOSIS — E05 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: Secondary | ICD-10-CM

## 2017-01-31 DIAGNOSIS — I10 Essential (primary) hypertension: Secondary | ICD-10-CM

## 2017-01-31 DIAGNOSIS — I129 Hypertensive chronic kidney disease with stage 1 through stage 4 chronic kidney disease, or unspecified chronic kidney disease: Secondary | ICD-10-CM | POA: Diagnosis not present

## 2017-01-31 DIAGNOSIS — Z79899 Other long term (current) drug therapy: Secondary | ICD-10-CM

## 2017-01-31 DIAGNOSIS — Z0000 Encounter for general adult medical examination without abnormal findings: Secondary | ICD-10-CM

## 2017-01-31 DIAGNOSIS — E119 Type 2 diabetes mellitus without complications: Secondary | ICD-10-CM

## 2017-01-31 LAB — POCT GLYCOSYLATED HEMOGLOBIN (HGB A1C): HEMOGLOBIN A1C: 8

## 2017-01-31 LAB — GLUCOSE, CAPILLARY: GLUCOSE-CAPILLARY: 103 mg/dL — AB (ref 65–99)

## 2017-01-31 NOTE — Assessment & Plan Note (Signed)
She has a normal Pap smear and mammogram.  She was provided with referral for colonoscopy-she states that she is not ready for colonoscopy at and will make her appointment as soon as she felt ready for that.  She still did not had her DEXA scan done which was ordered during previous visit.

## 2017-01-31 NOTE — Patient Instructions (Signed)
Thank you for visiting clinic today. Your blood sugar control is improving, please continue to do work you are doing currently. We are not making any changes to your medication at this time. Please follow-up in 3 month to reassess your blood pressure and blood sugar.

## 2017-01-31 NOTE — Progress Notes (Signed)
   CC: For follow-up of her diabetes and hypertension.  HPI:  [name] is a 61 y.o. with past medical history as listed below came to the clinic for follow-up of her diabetes and hypertension.  She has no complaints today.  Past Medical History:  Diagnosis Date  . Diabetes mellitus   . Ganglion cyst    right wrist  . GERD (gastroesophageal reflux disease)   . History of radiation therapy 02/27/10 -04/13/10   left base of tongue, bilat neck  . Hyperlipidemia   . Hypertension   . Iron deficiency anemia   . 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 Menopause   . Thyroid disease    Hyperthyroidism  . Tobacco abuse    PT HAS QUIT    Review of Systems:  As per HPI.  Physical Exam:  Vitals:   01/31/17 1432 01/31/17 1435 01/31/17 1535  BP: (!) 146/66 (!) 146/66 117/69  Pulse: 96 96 90  Temp: 98.4 F (36.9 C) 98.4 F (36.9 C)   TempSrc: Oral Oral   SpO2: 100% 100%   Weight: 131 lb 12.8 oz (59.8 kg) 131 lb 12.8 oz (59.8 kg)     General: Vital signs reviewed.  Patient is well-developed and well-nourished, in no acute distress and cooperative with exam.  Head: Normocephalic and atraumatic. Eyes: EOMI, conjunctivae normal, no scleral icterus.  Neck: Supple, trachea midline, normal ROM, no JVD, masses, thyromegaly, or carotid bruit present.  Cardiovascular: RRR, S1 normal, S2 normal, no murmurs, gallops, or rubs. Pulmonary/Chest: Clear to auscultation bilaterally, no wheezes, rales, or rhonchi. Abdominal: Soft, non-tender, non-distended, BS +, no masses, organomegaly, or guarding present.  Extremities: No lower extremity edema bilaterally,  pulses symmetric and intact bilaterally. No cyanosis or clubbing. Neurological: A&O x3, Strength is normal and symmetric bilaterally, cranial nerve II-XII are grossly intact, no focal motor deficit, sensory intact to light touch bilaterally.  Psychiatric: Flat affect.  Assessment & Plan:   See Encounters Tab  for problem based charting.  Patient discussed with Dr. Dareen Piano

## 2017-01-31 NOTE — Assessment & Plan Note (Signed)
A1c 8.0.  Improved from her previous A1c of 8.8. According to patient she is compliant with her medications.  She do complained of a couple of hypoglycemic events, her lowest recorded blood sugar was 55 before dinner. She do become symptomatic and her symptoms relieved with eating. Most of her blood sugar remains in high 100s.  She was advised to continue her current regimen of metformin thousand milligram twice daily, Lantus 30 units twice daily and NovoLog 7-10-8. She was also advised to keep monitoring her blood sugar and adjust her NovoLog accordingly.  Follow-up in 3 month.

## 2017-01-31 NOTE — Assessment & Plan Note (Signed)
She follow-up with endocrinology.  Currently off the methimazole.

## 2017-01-31 NOTE — Assessment & Plan Note (Signed)
DEXA scan was ordered during her previous visit.  She has not done that yet.  She was advised to go for her DEXA scan.

## 2017-01-31 NOTE — Assessment & Plan Note (Signed)
Her protein urea is improving.  She follow-up with nephrology.

## 2017-01-31 NOTE — Assessment & Plan Note (Signed)
BP Readings from Last 3 Encounters:  01/31/17 117/69  11/18/16 122/80  11/08/16 (!) 143/68   Her blood pressure was mildly elevated on initial presentation on recheck it was normal.  -Continue her current dose of lisinopril 40 mg daily, verapamil 360 mg daily and spironolactone 25 mg daily.

## 2017-02-03 NOTE — Progress Notes (Signed)
Internal Medicine Clinic Attending  Case discussed with Dr. Amin at the time of the visit.  We reviewed the resident's history and exam and pertinent patient test results.  I agree with the assessment, diagnosis, and plan of care documented in the resident's note.    

## 2017-02-25 ENCOUNTER — Telehealth: Payer: Self-pay | Admitting: Dietician

## 2017-02-25 ENCOUNTER — Ambulatory Visit (INDEPENDENT_AMBULATORY_CARE_PROVIDER_SITE_OTHER): Payer: Medicaid Other | Admitting: Dietician

## 2017-02-25 VITALS — Wt 131.3 lb

## 2017-02-25 DIAGNOSIS — E119 Type 2 diabetes mellitus without complications: Secondary | ICD-10-CM

## 2017-02-25 DIAGNOSIS — Z713 Dietary counseling and surveillance: Secondary | ICD-10-CM | POA: Diagnosis not present

## 2017-02-25 DIAGNOSIS — Z794 Long term (current) use of insulin: Secondary | ICD-10-CM

## 2017-02-25 DIAGNOSIS — Z6824 Body mass index (BMI) 24.0-24.9, adult: Secondary | ICD-10-CM

## 2017-02-25 NOTE — Telephone Encounter (Signed)
Spoke with Dr. Reesa Chew who Susan Evans decreasing her dose of Lantus to 25 units twice daily. Called Susan Evans and she acknowledged understanding to her new dose. She agreed to follow up in 4 weeks to assess her progress.

## 2017-02-25 NOTE — Patient Instructions (Signed)
Per our discussion today: Goals for this month to help lower your A1C to your goal of around  7%: Check your blood sugar as you have been Try to take usual doses of Novolog or at least a smaller dose instead of skipping it  I will call you to let you know if Dr. Haydee Salter you to lower your lantus to 25 units twice daily

## 2017-02-25 NOTE — Progress Notes (Signed)
Diabetes Self-Management Education  Visit Type:  Follow-up  Appt. Start Time: 1100 Appt. End Time: 1130  02/25/2017  [name], identified by name and date of birth, is a 61 y.o. female with a diagnosis of Diabetes:  Marland Kitchen Type 2  ASSESSMENT She got her new meter and strips and brought them to our visit today. She had aeveral high blood sugar while at the beach that scared her, but other wise her blood sugars are averaging 172,  fasting close to goal at 144, but PM above goal at  163 at 500 to 630 Pm,  206 at 630-9 Pm [phone] AM,  216 nocturnally She reports eating and decreasing nighttime Novolog defensively to raise blood sugars due to fear of hypoglycemia.   She reports having plenty of supplies, like using insulin pens and has a system to be sure she doesn't get them mixed up.  Weight 131 lb 4.8 oz (59.6 kg). Body mass index is 24.02 kg/m.       Diabetes Self-Management Education - 02/25/17 1400      Health Coping   How would you rate your overall health? Good     Psychosocial Assessment   Patient Belief/Attitude about Diabetes Motivated to manage diabetes   Self-care barriers Lack of transportation;Lack of material resources  ambivalent about fear of low sugars/desire for lower A1C   Self-management support Doctor's office;Family;CDE visits   Patient Concerns Glycemic Control     Pre-Education Assessment   Patient understands monitoring blood glucose, interpreting and using results Needs Review   Patient understands how to develop strategies to promote health/change behavior. Needs Review     Complications   Last HgB A1C per patient/outside source 8 %   How often do you check your blood sugar? 1-2 times/day   Fasting Blood glucose range (mg/dL) 70-129;130-179   Postprandial Blood glucose range (mg/dL) 180-200;>200   Number of hypoglycemic episodes per month 1   Can you tell when your blood sugar is low? Yes   What do you do if your blood sugar is low?  eats, waits to take insulin   Number of hyperglycemic episodes per week 3   Can you tell when your blood sugar is high? Yes   What do you do if your blood sugar is high? eats less takes more insulin   Have you had a dilated eye exam in the past 12 months? Yes   Have you had a dental exam in the past 12 months? --  has no teeth   Are you checking your feet? Yes   How many days per week are you checking your feet? 7     Dietary Intake   Breakfast oatmeal, 1/2 banan coff w/sf cream   Lunch 130 PM: cheeseburger   Dinner 5 PMsalad, baked wings, potatoes   Snack (evening) sometimes a fruit & pretzel bar     Exercise   Exercise Type ADL's;Light (walking / raking leaves)   How many days per week to you exercise? 3   How many minutes per day do you exercise? 25   Total minutes per week of exercise 75     Patient Education   Previous Diabetes Education Yes (here)   Medications Reviewed patients medication for diabetes, action, purpose, timing of dose and side effects.   Monitoring Identified appropriate SMBG and/or A1C goals.   Personal strategies to promote health Helped patient develop diabetes management plan for attaining blood sugars to acheive her goal A1C of 7%.  Subsequent Visit   Since your last visit have you continued or begun to take your medications as prescribed? Yes   Since your last visit have you had your blood pressure checked? Yes   Is your most recent blood pressure lower, unchanged, or higher since your last visit? Lower   Since your last visit have you experienced any weight changes? No change   Since your last visit, are you checking your blood glucose at least once a day? Yes      Learning Objective:  Patient will have a greater understanding of diabetes self-management. Patient education plan is to attend individual and/or group sessions per assessed needs and concerns. My plan to support myself in continuing these changes to care for my diabetes is to attend  or contact:  ?-doctor's office, CDE, Dietitian, pharmacist, church  Plan:   Patient Instructions  Per our discussion today: Goals for this month to help lower your A1C to your goal of around  7%: Check your blood sugar as you have been Try to take usual doses of Novolog or at least a smaller dose instead of skipping it  I will call you to let you know if Dr. Haydee Salter you to lower your lantus to 25 units twice daily   Expected Outcomes:   good  Education material provided: AVS & CBG download  If problems or questions, patient to contact team via:  Phone  Future DSME appointment: -   4 weeks [place], [place] 02/25/2017 3:18 PM.

## 2017-03-10 MED ORDER — INSULIN GLARGINE 100 UNIT/ML ~~LOC~~ SOLN: 25.0000 [IU] | Freq: Two times a day (BID) | SUBCUTANEOUS | 2 refills | Status: DC

## 2017-03-23 ENCOUNTER — Other Ambulatory Visit: Payer: Self-pay | Admitting: Internal Medicine

## 2017-03-25 ENCOUNTER — Other Ambulatory Visit: Payer: Self-pay | Admitting: *Deleted

## 2017-03-25 DIAGNOSIS — I10 Essential (primary) hypertension: Secondary | ICD-10-CM

## 2017-03-25 MED ORDER — PRAVASTATIN SODIUM 40 MG PO TABS: 40.0000 mg | ORAL_TABLET | Freq: Every evening | ORAL | 3 refills | Status: DC

## 2017-03-25 MED ORDER — OMEPRAZOLE 20 MG PO CPDR: DELAYED_RELEASE_CAPSULE | ORAL | 0 refills | Status: DC

## 2017-03-25 MED ORDER — METFORMIN HCL 1000 MG PO TABS: 1000.0000 mg | ORAL_TABLET | Freq: Two times a day (BID) | ORAL | 3 refills | Status: DC

## 2017-03-25 NOTE — Telephone Encounter (Signed)
Pt stated she has been taking Omeprazole for a long time for acid reflux; stated it has been prescribed by her doctor.

## 2017-03-25 NOTE — Telephone Encounter (Signed)
I talked with patient to inform about potential side effects of PPI especially with long term use. She states that she gets bad reflex symptoms. I advised not to take more then once daily. I decreased her dose to once daily.

## 2017-03-25 NOTE — Telephone Encounter (Signed)
I will defer to Dr Reesa Chew but in my review of her chart I do not see that she has a diagnosis associated with this medication, her last EGD in 2005 was normal with small hiatal hernia.  I am concerned about giving her long term PPI BID given she has CKD as well as osteoporosis.

## 2017-03-25 NOTE — Telephone Encounter (Signed)
Can we ask her why she needs to continue omeprazole, as I could not find any reason on her chart review.

## 2017-03-31 ENCOUNTER — Other Ambulatory Visit: Payer: Self-pay

## 2017-03-31 NOTE — Telephone Encounter (Signed)
insulin glargine (LANTUS) 100 UNIT/ML injection, refill request @ [facility].

## 2017-03-31 NOTE — Telephone Encounter (Signed)
Script from June was marked no print so it did not go electronically and was not printed, called pharmacy and gave script verbally

## 2017-04-18 ENCOUNTER — Other Ambulatory Visit: Payer: Self-pay | Admitting: Internal Medicine

## 2017-04-18 DIAGNOSIS — Z1231 Encounter for screening mammogram for malignant neoplasm of breast: Secondary | ICD-10-CM

## 2017-04-27 ENCOUNTER — Other Ambulatory Visit: Payer: Self-pay | Admitting: Internal Medicine

## 2017-04-27 DIAGNOSIS — E119 Type 2 diabetes mellitus without complications: Secondary | ICD-10-CM

## 2017-04-28 ENCOUNTER — Other Ambulatory Visit: Payer: Self-pay | Admitting: Internal Medicine

## 2017-04-28 NOTE — Telephone Encounter (Signed)
She can continue taking 1 tablet daily.

## 2017-05-09 ENCOUNTER — Encounter: Payer: Self-pay | Admitting: Internal Medicine

## 2017-05-09 ENCOUNTER — Ambulatory Visit (INDEPENDENT_AMBULATORY_CARE_PROVIDER_SITE_OTHER): Payer: Medicaid Other | Admitting: Internal Medicine

## 2017-05-09 VITALS — BP 147/105 | HR 96 | Temp 98.2°F | Ht 62.0 in | Wt 131.1 lb

## 2017-05-09 DIAGNOSIS — E1122 Type 2 diabetes mellitus with diabetic chronic kidney disease: Secondary | ICD-10-CM | POA: Diagnosis present

## 2017-05-09 DIAGNOSIS — Z79899 Other long term (current) drug therapy: Secondary | ICD-10-CM

## 2017-05-09 DIAGNOSIS — Z87891 Personal history of nicotine dependence: Secondary | ICD-10-CM | POA: Diagnosis not present

## 2017-05-09 DIAGNOSIS — I129 Hypertensive chronic kidney disease with stage 1 through stage 4 chronic kidney disease, or unspecified chronic kidney disease: Secondary | ICD-10-CM | POA: Diagnosis not present

## 2017-05-09 DIAGNOSIS — M810 Age-related osteoporosis without current pathological fracture: Secondary | ICD-10-CM

## 2017-05-09 DIAGNOSIS — Z0000 Encounter for general adult medical examination without abnormal findings: Secondary | ICD-10-CM

## 2017-05-09 DIAGNOSIS — E05 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: Secondary | ICD-10-CM | POA: Diagnosis not present

## 2017-05-09 DIAGNOSIS — C140 Malignant neoplasm of pharynx, unspecified: Secondary | ICD-10-CM

## 2017-05-09 DIAGNOSIS — N181 Chronic kidney disease, stage 1: Secondary | ICD-10-CM | POA: Diagnosis not present

## 2017-05-09 DIAGNOSIS — Z794 Long term (current) use of insulin: Secondary | ICD-10-CM

## 2017-05-09 DIAGNOSIS — E119 Type 2 diabetes mellitus without complications: Secondary | ICD-10-CM

## 2017-05-09 DIAGNOSIS — I10 Essential (primary) hypertension: Secondary | ICD-10-CM

## 2017-05-09 LAB — GLUCOSE, CAPILLARY: Glucose-Capillary: 155 mg/dL — ABNORMAL HIGH (ref 65–99)

## 2017-05-09 LAB — POCT GLYCOSYLATED HEMOGLOBIN (HGB A1C): Hemoglobin A1C: 8.3

## 2017-05-09 MED ORDER — INSULIN GLARGINE 100 UNIT/ML ~~LOC~~ SOLN: 30.0000 [IU] | Freq: Two times a day (BID) | SUBCUTANEOUS | 0 refills | Status: DC

## 2017-05-09 NOTE — Assessment & Plan Note (Signed)
She recently followed up with her Otolaryngologist on 04/29/2017. Laryngoscope  was normal during that visit and she was advised to follow-up in one year.

## 2017-05-09 NOTE — Progress Notes (Signed)
   CC: For follow-up of her diabetes and hypertension.  HPI:  [name] is a 61 y.o. lady with past medical history as listed below came to the clinic for follow-up of her diabetes and hypertension.  She had no complaints today. Please see assessment and plan for her chronic problems.  Past Medical History:  Diagnosis Date  . Diabetes mellitus   . Ganglion cyst    right wrist  . GERD (gastroesophageal reflux disease)   . History of radiation therapy 02/27/10 -04/13/10   left base of tongue, bilat neck  . Hyperlipidemia   . Hypertension   . Iron deficiency anemia   . 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 Menopause   . Thyroid disease    Hyperthyroidism  . Tobacco abuse    PT HAS QUIT   Review of Systems:  As per HPI.  Physical Exam:  Vitals:   05/09/17 1315  BP: (!) 147/105  Pulse: 96  Temp: 98.2 F (36.8 C)  TempSrc: Oral  Weight: 131 lb 1.6 oz (59.5 kg)  Height: 5\' 2"  (1.575 m)   General: Vital signs reviewed.  Patient is well-developed and well-nourished, in no acute distress and cooperative with exam.  Head: Normocephalic and atraumatic. Eyes: EOMI, conjunctivae normal, no scleral icterus.  Cardiovascular: RRR, S1 normal, S2 normal, no murmurs, gallops, or rubs. Pulmonary/Chest: Clear to auscultation bilaterally, no wheezes, rales, or rhonchi. Abdominal: Soft, non-tender, non-distended, BS +, no masses, organomegaly, or guarding present.  Musculoskeletal: No joint deformities, erythema, or stiffness, ROM full and nontender. Extremities: No lower extremity edema bilaterally,  pulses symmetric and intact bilaterally. No cyanosis or clubbing. Neurological: A&O x3, Strength is normal and symmetric bilaterally, cranial nerve II-XII are grossly intact, no focal motor deficit, sensory intact to light touch bilaterally.  Skin: Warm, dry and intact. No rashes or erythema. Psychiatric: Normal mood and affect. speech and behavior is normal.  Cognition and memory are normal.  Assessment & Plan:   See Encounters Tab for problem based charting.  Patient discussed with Dr. Lynnae January.

## 2017-05-09 NOTE — Patient Instructions (Addendum)
Thank you for visiting clinic today. Please had your mammogram and bone scan done according to your scheduled on 05/22/2017. According to your previous bone scan you are having thin bones which can increase chances of fracture. Please take all the precautions to prevent any fall. Please follow-up in 1 month and bring your glucometer meter. Your A1c was high today at 8.3. Today I am just increasing your Lantus dose in the morning, now you will take 32 units in the morning and 30 units in the evening. Keep taking NovoLog according to your existing regimen. Please make an appointment for your screening colonoscopy as soon as possible.

## 2017-05-09 NOTE — Assessment & Plan Note (Signed)
She is due for colonoscopy, in the past she was not ready for that. Today she said she will schedule her colonoscopy in September 2018, she was encouraged to do so.

## 2017-05-09 NOTE — Assessment & Plan Note (Signed)
Her A1c today was 8.3, increased from her previous one which was 8.0. Patient did not bring her glucometer meter today. According to patient she had just 1 episode of low CBG at 85 and she was little jittery. It was a fasting CBG, and night before she ate a small amount of salad and some baked chicken. She denied any more episodes of hypoglycemia. According to patient her blood sugar today was 160 fasting. She was prescribed NovoLog as 7-10-8 , but she takes 7-6-8, along with Lantus 30 units twice a day and metformin 1000 mg twice a day.  -I increased her morning dose of Lantus to 32 units, she was advised to continue existing regimen. She should follow-up within 1 month and bring her glucometer meter for further adjustment.

## 2017-05-09 NOTE — Assessment & Plan Note (Signed)
BP Readings from Last 3 Encounters:  05/09/17 (!) 147/105  01/31/17 117/69  11/18/16 122/80   On recheck her blood pressure was 147/70. According to patient whenever she checks at home her blood pressure lies around 130s over 70s. She denies any headache or blurry vision. She denies any chest pain or exertional dyspnea.  I will continue with the existing management which include lisinopril 40 mg daily, verapamil 360 mg daily and spironolactone 25 mg daily. We will reassess during next follow-up visit.

## 2017-05-09 NOTE — Assessment & Plan Note (Signed)
She follow-up with endocrinology. She currently had no symptoms.  She is off the methimazole now.  She was advised to continue follow-up with endocrinology.

## 2017-05-09 NOTE — Assessment & Plan Note (Signed)
She follow-up with nephrology regularly. Her last visit was in July 2018, which shows mild worsening in her creatinine.  -She was advised to continue follow-up with nephrology according to her schedule.

## 2017-05-09 NOTE — Assessment & Plan Note (Addendum)
Her previous DEXA scan done in 2014 shows osteoporosis. Patient was never treated with any bisphosphonate. She does walking as an exercise, no weightbearing exercises. She is going for an other DEXA scan on 05/22/2017.  If she is still shows osteoporosis on DEXA scan, we will start her on bisphosphonate. She was advised to do some weightbearing exercises, and take necessary precautions to prevent any fall.  Addendum.her repeat DEXA scan shows a score of -3.1. She needs an appointment to discuss about bisphosphonate therapy. We need to check her BMP, calcium and vitamin D level before starting bisphosphonate. I called the patient and left a message to make an appointment to be seen in clinic to discuss her options.

## 2017-05-13 NOTE — Progress Notes (Signed)
Internal Medicine Clinic Attending  Case discussed with Dr. Amin at the time of the visit.  We reviewed the resident's history and exam and pertinent patient test results.  I agree with the assessment, diagnosis, and plan of care documented in the resident's note.    

## 2017-05-22 ENCOUNTER — Ambulatory Visit
Admission: RE | Admit: 2017-05-22 | Discharge: 2017-05-22 | Disposition: A | Discharge status: Home / Self Care | Payer: Medicaid Other | Source: Ambulatory Visit | Attending: Internal Medicine | Admitting: Internal Medicine

## 2017-05-22 DIAGNOSIS — M810 Age-related osteoporosis without current pathological fracture: Secondary | ICD-10-CM | POA: Diagnosis not present

## 2017-05-22 DIAGNOSIS — Z1231 Encounter for screening mammogram for malignant neoplasm of breast: Secondary | ICD-10-CM | POA: Diagnosis not present

## 2017-05-22 DIAGNOSIS — Z780 Asymptomatic menopausal state: Secondary | ICD-10-CM | POA: Diagnosis not present

## 2017-06-09 ENCOUNTER — Telehealth: Payer: Self-pay | Admitting: Internal Medicine

## 2017-06-09 NOTE — Telephone Encounter (Signed)
Pls called patient, patient missed call from physician

## 2017-06-17 ENCOUNTER — Other Ambulatory Visit: Payer: Self-pay | Admitting: *Deleted

## 2017-06-18 MED ORDER — OMEPRAZOLE 20 MG PO CPDR: DELAYED_RELEASE_CAPSULE | ORAL | 0 refills | Status: DC

## 2017-06-19 ENCOUNTER — Other Ambulatory Visit: Payer: Self-pay | Admitting: Internal Medicine

## 2017-06-19 DIAGNOSIS — E119 Type 2 diabetes mellitus without complications: Secondary | ICD-10-CM

## 2017-06-19 NOTE — Telephone Encounter (Signed)
done

## 2017-06-20 ENCOUNTER — Encounter (INDEPENDENT_AMBULATORY_CARE_PROVIDER_SITE_OTHER): Payer: Self-pay

## 2017-06-20 ENCOUNTER — Encounter: Payer: Medicaid Other | Admitting: Internal Medicine

## 2017-06-20 ENCOUNTER — Encounter: Payer: Self-pay | Admitting: Internal Medicine

## 2017-06-26 LAB — HM DIABETES EYE EXAM

## 2017-06-27 ENCOUNTER — Other Ambulatory Visit: Payer: Self-pay | Admitting: Internal Medicine

## 2017-06-27 ENCOUNTER — Encounter: Payer: Self-pay | Admitting: *Deleted

## 2017-06-27 DIAGNOSIS — I10 Essential (primary) hypertension: Secondary | ICD-10-CM

## 2017-06-27 NOTE — Telephone Encounter (Signed)
I'm giving her 1 month supply of lisinopril. She missed her appointment with me last month. She needs to have her BMP as she is on lisinopril and spironolactone both. Please call the patient and make an appointment.

## 2017-06-27 NOTE — Progress Notes (Signed)
This encounter was created in error - please disregard.

## 2017-06-30 NOTE — Telephone Encounter (Signed)
Has appt with pcp 10/26, does she need labs before this appt? Thanks!

## 2017-07-07 ENCOUNTER — Other Ambulatory Visit: Payer: Self-pay | Admitting: Internal Medicine

## 2017-07-07 DIAGNOSIS — E119 Type 2 diabetes mellitus without complications: Secondary | ICD-10-CM

## 2017-07-08 ENCOUNTER — Other Ambulatory Visit: Payer: Self-pay | Admitting: *Deleted

## 2017-07-08 MED ORDER — GLUCOSE BLOOD VI STRP: 1.0000 | ORAL_STRIP | Freq: Every day | 11 refills | Status: DC

## 2017-07-18 ENCOUNTER — Encounter (INDEPENDENT_AMBULATORY_CARE_PROVIDER_SITE_OTHER): Payer: Self-pay

## 2017-07-18 ENCOUNTER — Ambulatory Visit (INDEPENDENT_AMBULATORY_CARE_PROVIDER_SITE_OTHER): Payer: Medicaid Other | Admitting: Internal Medicine

## 2017-07-18 VITALS — BP 146/62 | HR 91 | Temp 98.8°F | Ht 62.0 in | Wt 132.3 lb

## 2017-07-18 DIAGNOSIS — E785 Hyperlipidemia, unspecified: Secondary | ICD-10-CM | POA: Diagnosis not present

## 2017-07-18 DIAGNOSIS — Z0000 Encounter for general adult medical examination without abnormal findings: Secondary | ICD-10-CM | POA: Diagnosis not present

## 2017-07-18 DIAGNOSIS — Z23 Encounter for immunization: Secondary | ICD-10-CM

## 2017-07-18 DIAGNOSIS — E781 Pure hyperglyceridemia: Secondary | ICD-10-CM | POA: Diagnosis not present

## 2017-07-18 DIAGNOSIS — I10 Essential (primary) hypertension: Secondary | ICD-10-CM | POA: Diagnosis not present

## 2017-07-18 DIAGNOSIS — E119 Type 2 diabetes mellitus without complications: Secondary | ICD-10-CM

## 2017-07-18 LAB — GLUCOSE, CAPILLARY: GLUCOSE-CAPILLARY: 154 mg/dL — AB (ref 65–99)

## 2017-07-18 MED ORDER — OMEPRAZOLE 20 MG PO CPDR: DELAYED_RELEASE_CAPSULE | ORAL | 0 refills | Status: DC

## 2017-07-18 MED ORDER — AMLODIPINE BESYLATE 5 MG PO TABS: 5.0000 mg | ORAL_TABLET | Freq: Every day | ORAL | 11 refills | Status: DC

## 2017-07-18 MED ORDER — LISINOPRIL 40 MG PO TABS: 40.0000 mg | ORAL_TABLET | Freq: Every day | ORAL | 2 refills | Status: DC

## 2017-07-18 NOTE — Patient Instructions (Addendum)
Thank you for visiting clinic today. I'm stopping your spironolactone and adding a new medicine called amlodipine for your blood pressure, please take it as directed. You will continue taking Lantus 32 units in the morning and 30 units at bedtime, please take NovoLog 7 units in the morning, 6 units with lunch and 10 units with dinner. Please watch for any signs of low blood sugar and keep some food or glucose tablets with you all the time. You should not be skipping any meals while taking insulin. Please follow-up in one month for your blood pressure.

## 2017-07-18 NOTE — Progress Notes (Signed)
   CC:  For follow-up of her diabetes and hypertension.  HPI:  [name] is a 61 y.o. with past medical history as listed below came to the clinic for follow-up of her diabetes and hypertension.  She has no complaints today.  Please see assessment and plan for her chronic conditions.  Past Medical History:  Diagnosis Date  . Diabetes mellitus   . Ganglion cyst    right wrist  . GERD (gastroesophageal reflux disease)   . History of radiation therapy 02/27/10 -04/13/10   left base of tongue, bilat neck  . Hyperlipidemia   . Hypertension   . Iron deficiency anemia   . 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 Menopause   . Thyroid disease    Hyperthyroidism  . Tobacco abuse    PT HAS QUIT   Review of Systems:  As per HPI.  Physical Exam:  Vitals:   07/18/17 1453  BP: (!) 158/61  Pulse: 99  Temp: 98.8 F (37.1 C)  TempSrc: Oral  SpO2: 99%  Weight: 132 lb 4.8 oz (60 kg)  Height: 5\' 2"  (1.575 m)    General: Vital signs reviewed.  Patient is well-developed and well-nourished, in no acute distress and cooperative with exam.  Head: Normocephalic and atraumatic. Eyes: EOMI, conjunctivae normal, no scleral icterus.  Neck: Supple, trachea midline, normal ROM, no JVD, masses, thyromegaly, or carotid bruit present.  Cardiovascular: RRR, S1 normal, S2 normal, no murmurs, gallops, or rubs. Pulmonary/Chest: Clear to auscultation bilaterally, no wheezes, rales, or rhonchi. Abdominal: Soft, non-tender, non-distended, BS +, no masses, organomegaly, or guarding present.  Extremities: No lower extremity edema bilaterally,  pulses symmetric and intact bilaterally. No cyanosis or clubbing. Skin: Warm, dry and intact. No rashes or erythema. Psychiatric: Normal mood and affect. speech and behavior is normal. Cognition and memory are normal.  Assessment & Plan:   See Encounters Tab for problem based charting.  Patient discussed with Dr. Dareen Piano

## 2017-07-19 LAB — BMP8+ANION GAP
ANION GAP: 15 mmol/L (ref 10.0–18.0)
BUN/Creatinine Ratio: 13 (ref 12–28)
BUN: 14 mg/dL (ref 8–27)
CALCIUM: 10.3 mg/dL (ref 8.7–10.3)
CO2: 21 mmol/L (ref 20–29)
Chloride: 94 mmol/L — ABNORMAL LOW (ref 96–106)
Creatinine, Ser: 1.08 mg/dL — ABNORMAL HIGH (ref 0.57–1.00)
GFR calc Af Amer: 64 mL/min/{1.73_m2} (ref >59)
GFR, EST NON AFRICAN AMERICAN: 56 mL/min/{1.73_m2} — AB (ref >59)
Glucose: 120 mg/dL — ABNORMAL HIGH (ref 65–99)
POTASSIUM: 4.9 mmol/L (ref 3.5–5.2)
Sodium: 130 mmol/L — ABNORMAL LOW (ref 134–144)

## 2017-07-19 LAB — LIPID PANEL
CHOL/HDL RATIO: 5.1 ratio — AB (ref 0.0–4.4)
Cholesterol, Total: 139 mg/dL (ref 100–199)
HDL: 27 mg/dL — ABNORMAL LOW (ref >39)
LDL Calculated: 37 mg/dL (ref 0–99)
Triglycerides: 374 mg/dL — ABNORMAL HIGH (ref 0–149)
VLDL Cholesterol Cal: 75 mg/dL — ABNORMAL HIGH (ref 5–40)

## 2017-07-21 NOTE — Assessment & Plan Note (Signed)
BP Readings from Last 3 Encounters:  07/18/17 (!) 146/62  05/09/17 (!) 147/105  01/31/17 117/69   On initial encounter her blood pressure was 158/61, on subsequent check it was 146/62. According to patient whenever she checked her blood pressure at home her systolic remained between 130 to 140.  Her blood pressure is elevated. Currently she is on lisinopril 40 mg, verapamil 360 mg and spironolactone 25 mg.  -Discontinue spironolactone. -Add amlodipine 5 mg daily- dose can be titrated up if needed. -Follow-up in 4 week for blood pressure check

## 2017-07-21 NOTE — Assessment & Plan Note (Signed)
Her A1c done in August 2018 was 8.3. She was on Lantus 32 units in the morning and 30 units at bedtime along with NovoLog 7-5-8.  According to the record of her monitor she had an average blood sugar of 170, with highest of 374, couple of readings in 70s, mostly around lunch, 1 in the morning, patient to become symptomatic and eating something to relieve her symptoms.  -She was asked to continue existing dose of Lantus. -NovoLog was little adjusted as 03-28-09 -Referable was provided for Butch Penny.

## 2017-07-21 NOTE — Assessment & Plan Note (Signed)
Her repeat lipid check shows persistent hypertriglyceridemia.  She is currently on pravachol 40 mg daily, she will benefit with the addition of fenofibrate. Patient do not want to add another medication at this time.  We will talk about adding fenofibrate during next follow-up visit.

## 2017-07-21 NOTE — Assessment & Plan Note (Signed)
She was provided with flu shot. Had her annual foot exam which was normal. Referral for colonoscopy was provided.

## 2017-07-22 ENCOUNTER — Telehealth: Payer: Self-pay | Admitting: *Deleted

## 2017-07-22 NOTE — Telephone Encounter (Signed)
Pharmacist calls from [facility] and ask should pt be taking both the verapamil and amlodipine, this is not an usual combination, please advise

## 2017-07-22 NOTE — Telephone Encounter (Signed)
We can hold onto amlodipine. I will reassess patient during next follow-up visit.

## 2017-07-23 NOTE — Addendum Note (Signed)
Addended by: [name] on: 07/23/2017 01:31 PM   Modules accepted: Orders

## 2017-07-23 NOTE — Telephone Encounter (Signed)
Called and informed pharm to inactivate amlodipine

## 2017-07-27 NOTE — Progress Notes (Signed)
Internal Medicine Clinic Attending  Case discussed with Dr. Amin at the time of the visit.  We reviewed the resident's history and exam and pertinent patient test results.  I agree with the assessment, diagnosis, and plan of care documented in the resident's note.    

## 2017-07-29 ENCOUNTER — Encounter: Payer: Self-pay | Admitting: *Deleted

## 2017-08-17 ENCOUNTER — Other Ambulatory Visit: Payer: Self-pay | Admitting: Internal Medicine

## 2017-08-17 DIAGNOSIS — E119 Type 2 diabetes mellitus without complications: Secondary | ICD-10-CM

## 2017-08-26 ENCOUNTER — Encounter: Payer: Self-pay | Admitting: Internal Medicine

## 2017-08-27 NOTE — Addendum Note (Signed)
Addended by: Hulan Fray on: 08/27/2017 06:32 AM   Modules accepted: Orders

## 2017-09-03 ENCOUNTER — Other Ambulatory Visit: Payer: Self-pay | Admitting: Internal Medicine

## 2017-09-05 ENCOUNTER — Other Ambulatory Visit: Payer: Self-pay

## 2017-09-05 ENCOUNTER — Encounter: Payer: Self-pay | Admitting: Internal Medicine

## 2017-09-05 ENCOUNTER — Ambulatory Visit: Payer: Medicaid Other | Admitting: Internal Medicine

## 2017-09-05 VITALS — BP 155/73 | HR 101 | Temp 97.4°F | Ht 62.0 in | Wt 132.6 lb

## 2017-09-05 DIAGNOSIS — E785 Hyperlipidemia, unspecified: Secondary | ICD-10-CM | POA: Diagnosis not present

## 2017-09-05 DIAGNOSIS — Z87891 Personal history of nicotine dependence: Secondary | ICD-10-CM

## 2017-09-05 DIAGNOSIS — M810 Age-related osteoporosis without current pathological fracture: Secondary | ICD-10-CM

## 2017-09-05 DIAGNOSIS — E119 Type 2 diabetes mellitus without complications: Secondary | ICD-10-CM

## 2017-09-05 DIAGNOSIS — I10 Essential (primary) hypertension: Secondary | ICD-10-CM

## 2017-09-05 DIAGNOSIS — Z79899 Other long term (current) drug therapy: Secondary | ICD-10-CM | POA: Diagnosis not present

## 2017-09-05 DIAGNOSIS — Z794 Long term (current) use of insulin: Secondary | ICD-10-CM

## 2017-09-05 DIAGNOSIS — E875 Hyperkalemia: Secondary | ICD-10-CM

## 2017-09-05 DIAGNOSIS — E781 Pure hyperglyceridemia: Secondary | ICD-10-CM | POA: Diagnosis not present

## 2017-09-05 LAB — POCT GLYCOSYLATED HEMOGLOBIN (HGB A1C): HEMOGLOBIN A1C: 7.5

## 2017-09-05 LAB — GLUCOSE, CAPILLARY: Glucose-Capillary: 128 mg/dL — ABNORMAL HIGH (ref 65–99)

## 2017-09-05 MED ORDER — VITAMIN D 50 MCG (2000 UT) PO CAPS: 2000.0000 [IU] | ORAL_CAPSULE | Freq: Every day | ORAL | 3 refills | Status: DC

## 2017-09-05 MED ORDER — FUROSEMIDE 20 MG PO TABS: 20.0000 mg | ORAL_TABLET | Freq: Every day | ORAL | 11 refills | Status: DC

## 2017-09-05 MED ORDER — ALENDRONATE SODIUM 70 MG PO TABS: 70.0000 mg | ORAL_TABLET | ORAL | 11 refills | Status: DC

## 2017-09-05 MED ORDER — CALCIUM CARBONATE-VITAMIN D 500-400 MG-UNIT PO TABS: 1.0000 | ORAL_TABLET | Freq: Every day | ORAL | 3 refills | Status: DC

## 2017-09-05 MED ORDER — ATORVASTATIN CALCIUM 40 MG PO TABS: 40.0000 mg | ORAL_TABLET | Freq: Every day | ORAL | 11 refills | Status: DC

## 2017-09-05 NOTE — Assessment & Plan Note (Signed)
She has hypertriglyceridemia on lipid check recently. Her ASCVD risk was 52.9% because of her other comorbidities. She was on Pravachol 40 mg daily, he needs high intensity statin.  Discontinue Pravachol. Start her on Lipitor 40 mg daily.

## 2017-09-05 NOTE — Assessment & Plan Note (Signed)
Her repeat DEXA scan shows a T score of -3.1.  She is high risk for a fracture.  She was started on Fosamax 70 mg/week. We checked her vitamin D, BMP today. She was given a prescription of calcium supplement and vitamin D 2000 units daily, we will call her with abnormal results.

## 2017-09-05 NOTE — Progress Notes (Addendum)
   CC: For follow-up of her diabetes and hypertension.  HPI:  Susan Evans is a 61 y.o. with past medical history as listed below came to the clinic for follow-up of her diabetes and hypertension.  She has no complaints today.  Please see assessment and plan for her chronic conditions.  Addendum.  Her vitamin D level was 7, a prescription for 50,000 units/week for 8 weeks was sent to her pharmacy. She was having mild hyperkalemia at 5.4, with little worsening of creatinine at 1.35.  She was recently started on Lasix 20 mg daily, called the patient and advised her to take an extra pill tomorrow morning, she was also advised to come to the clinic either tomorrow or day after tomorrow for a repeat BMP.  Addendum. 09/12/18.  On repeat BMP potassium was 5.0, but sodium decreased to 125 from 134.  Called the patient she was asymptomatic, advised her to come to the clinic for a repeat check, according to patient she has no ride and do not want to come to the clinic at this time.  She was advised to take some salty food and keep herself hydrated, she was also advised that if she feels fatigued or weak she should call the EMS and come to emergency room.  She will try to come to the clinic after the holidays for a repeat BMP.  Past Medical History:  Diagnosis Date  . Diabetes mellitus   . Ganglion cyst    right wrist  . GERD (gastroesophageal reflux disease)   . History of radiation therapy 02/27/10 -04/13/10   left base of tongue, bilat neck  . Hyperlipidemia   . Hypertension   . Iron deficiency anemia   . 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 Menopause   . Thyroid disease    Hyperthyroidism  . Tobacco abuse    PT HAS QUIT   Review of Systems: Negative except mentioned in HPI.  Physical Exam:  Vitals:   09/05/17 1321 09/05/17 1352  BP: (!) 164/65 (!) 155/73  Pulse: (!) 102 (!) 101  Temp: (!) 97.4 F (36.3 C)   TempSrc: Oral   SpO2: 100%   Weight: 132  lb 9.6 oz (60.1 kg)   Height: 5\' 2"  (1.575 m)     General: Vital signs reviewed.  Patient is well-developed and well-nourished, in no acute distress and cooperative with exam.  Head: Normocephalic and atraumatic. Eyes: EOMI, conjunctivae normal, no scleral icterus.  Cardiovascular: RRR, S1 normal, S2 normal, no murmurs, gallops, or rubs. Pulmonary/Chest: Clear to auscultation bilaterally, no wheezes, rales, or rhonchi. Abdominal: Soft, non-tender, non-distended, BS +, no masses, organomegaly, or guarding present.  Extremities: No lower extremity edema bilaterally,  pulses symmetric and intact bilaterally. No cyanosis or clubbing. Psychiatric: Normal mood and affect. speech and behavior is normal. Cognition and memory are normal.  Assessment & Plan:   See Encounters Tab for problem based charting.  Patient discussed with Dr. Beryle Beams.

## 2017-09-05 NOTE — Assessment & Plan Note (Signed)
Her A1c today was 7.5, which was improved from her previous A1c of 8.3 in August 2018. Patient never brought her glucometer with her today. She do not check her blood sugar very often. Her NovoLog dose was increased during previous visit.  -Continue existing regimen with Lantus 32 units in the morning, 30 units at bedtime and NovoLog 03-28-09.

## 2017-09-05 NOTE — Assessment & Plan Note (Signed)
BP Readings from Last 3 Encounters:  09/05/17 (!) 155/73  07/18/17 (!) 146/62  05/09/17 (!) 147/105   Her blood pressure was elevated.  She was started on amlodipine 5 mg during previous follow-up visit, later discontinued because she was already on verapamil.  She was advised to follow-up in clinic for reassessment of her hypertension.  She has an history of allergy to hydrochlorothiazide which is listed as itching. She is currently on lisinopril 40 mg daily and verapamil 360 mg daily.  Started her on Lasix 20 mg daily. Follow-up in 4 weeks for blood pressure check.

## 2017-09-05 NOTE — Patient Instructions (Signed)
Thank you for visiting clinic today. You are doing great with your diabetes, your A1c today was 7.5, it improved from 8.3.  Please keep up the good work. Your blood pressure was little elevated, I am adding Lasix 20 mg daily please take it in the morning. I am also giving you a medicine called Fosamax for your weak bones or osteoporosis, please take it as directed every week. We will check your calcium and vitamin D level today.  I sent a prescription for a calcium and vitamin D supplement to your pharmacy please take it daily.  If you need to take higher doses of vitamin D we will call you after your results are available. Please follow-up in 1 month for blood pressure check.

## 2017-09-06 LAB — VITAMIN D 25 HYDROXY (VIT D DEFICIENCY, FRACTURES): Vit D, 25-Hydroxy: 7.2 ng/mL — ABNORMAL LOW (ref 30.0–100.0)

## 2017-09-06 LAB — BMP8+ANION GAP
Anion Gap: 15 mmol/L (ref 10.0–18.0)
BUN/Creatinine Ratio: 14 (ref 12–28)
BUN: 19 mg/dL (ref 8–27)
CO2: 20 mmol/L (ref 20–29)
Calcium: 10.2 mg/dL (ref 8.7–10.3)
Chloride: 99 mmol/L (ref 96–106)
Creatinine, Ser: 1.35 mg/dL — ABNORMAL HIGH (ref 0.57–1.00)
GFR calc Af Amer: 49 mL/min/{1.73_m2} — ABNORMAL LOW (ref >59)
GFR calc non Af Amer: 42 mL/min/{1.73_m2} — ABNORMAL LOW (ref >59)
Glucose: 104 mg/dL — ABNORMAL HIGH (ref 65–99)
Potassium: 5.4 mmol/L — ABNORMAL HIGH (ref 3.5–5.2)
Sodium: 134 mmol/L (ref 134–144)

## 2017-09-07 ENCOUNTER — Other Ambulatory Visit: Payer: Self-pay | Admitting: Internal Medicine

## 2017-09-07 MED ORDER — VITAMIN D (ERGOCALCIFEROL) 1.25 MG (50000 UNIT) PO CAPS: 50000.0000 [IU] | ORAL_CAPSULE | ORAL | 0 refills | Status: DC

## 2017-09-07 NOTE — Addendum Note (Signed)
Addended by: [name] on: 09/07/2017 08:09 PM   Modules accepted: Orders

## 2017-09-08 NOTE — Progress Notes (Signed)
Medicine attending: Medical history, presenting problems, physical findings, and medications, reviewed with resident physician Dr Sumayya Amin on the day of the patient visit and I concur with her evaluation and management plan. 

## 2017-09-09 ENCOUNTER — Other Ambulatory Visit: Payer: Medicaid Other

## 2017-09-11 ENCOUNTER — Other Ambulatory Visit (INDEPENDENT_AMBULATORY_CARE_PROVIDER_SITE_OTHER): Payer: Medicaid Other

## 2017-09-11 DIAGNOSIS — I10 Essential (primary) hypertension: Secondary | ICD-10-CM | POA: Diagnosis not present

## 2017-09-11 DIAGNOSIS — E875 Hyperkalemia: Secondary | ICD-10-CM

## 2017-09-11 DIAGNOSIS — Z794 Long term (current) use of insulin: Secondary | ICD-10-CM

## 2017-09-11 DIAGNOSIS — E119 Type 2 diabetes mellitus without complications: Secondary | ICD-10-CM | POA: Diagnosis present

## 2017-09-12 ENCOUNTER — Telehealth: Payer: Self-pay | Admitting: *Deleted

## 2017-09-12 LAB — BMP8+ANION GAP
Anion Gap: 19 mmol/L — ABNORMAL HIGH (ref 10.0–18.0)
BUN/Creatinine Ratio: 15 (ref 12–28)
BUN: 19 mg/dL (ref 8–27)
CO2: 19 mmol/L — ABNORMAL LOW (ref 20–29)
Calcium: 9.7 mg/dL (ref 8.7–10.3)
Chloride: 87 mmol/L — ABNORMAL LOW (ref 96–106)
Creatinine, Ser: 1.23 mg/dL — ABNORMAL HIGH (ref 0.57–1.00)
GFR calc Af Amer: 55 mL/min/{1.73_m2} — ABNORMAL LOW (ref >59)
GFR calc non Af Amer: 47 mL/min/{1.73_m2} — ABNORMAL LOW (ref >59)
Glucose: 174 mg/dL — ABNORMAL HIGH (ref 65–99)
Potassium: 5.1 mmol/L (ref 3.5–5.2)
Sodium: 125 mmol/L — ABNORMAL LOW (ref 134–144)

## 2017-09-12 NOTE — Telephone Encounter (Signed)
-----   Message from Forde Dandy, PharmD sent at 07/24/2017 10:19 PM EDT ----- Regarding: Geriatrics Task Force Patient due for follow up call

## 2017-09-12 NOTE — Telephone Encounter (Signed)
No answer when called 

## 2017-09-15 ENCOUNTER — Other Ambulatory Visit: Payer: Self-pay | Admitting: Internal Medicine

## 2017-09-15 DIAGNOSIS — I10 Essential (primary) hypertension: Secondary | ICD-10-CM

## 2017-09-15 DIAGNOSIS — E119 Type 2 diabetes mellitus without complications: Secondary | ICD-10-CM

## 2017-09-18 NOTE — Telephone Encounter (Signed)
Next appt scheduled  10/10/17 with PCP.

## 2017-10-10 ENCOUNTER — Ambulatory Visit: Payer: Medicaid Other | Admitting: Internal Medicine

## 2017-10-10 ENCOUNTER — Other Ambulatory Visit: Payer: Self-pay

## 2017-10-10 ENCOUNTER — Encounter: Payer: Self-pay | Admitting: Internal Medicine

## 2017-10-10 VITALS — BP 124/66 | HR 100 | Temp 98.2°F | Wt 130.4 lb

## 2017-10-10 DIAGNOSIS — M810 Age-related osteoporosis without current pathological fracture: Secondary | ICD-10-CM

## 2017-10-10 DIAGNOSIS — Z79899 Other long term (current) drug therapy: Secondary | ICD-10-CM

## 2017-10-10 DIAGNOSIS — E785 Hyperlipidemia, unspecified: Secondary | ICD-10-CM | POA: Diagnosis not present

## 2017-10-10 DIAGNOSIS — I10 Essential (primary) hypertension: Secondary | ICD-10-CM

## 2017-10-10 DIAGNOSIS — E119 Type 2 diabetes mellitus without complications: Secondary | ICD-10-CM | POA: Diagnosis not present

## 2017-10-10 DIAGNOSIS — Z23 Encounter for immunization: Secondary | ICD-10-CM

## 2017-10-10 DIAGNOSIS — Z87891 Personal history of nicotine dependence: Secondary | ICD-10-CM

## 2017-10-10 DIAGNOSIS — Z794 Long term (current) use of insulin: Secondary | ICD-10-CM | POA: Diagnosis not present

## 2017-10-10 DIAGNOSIS — Z0000 Encounter for general adult medical examination without abnormal findings: Secondary | ICD-10-CM

## 2017-10-10 LAB — GLUCOSE, CAPILLARY: GLUCOSE-CAPILLARY: 193 mg/dL — AB (ref 65–99)

## 2017-10-10 MED ORDER — ALENDRONATE SODIUM 70 MG PO TABS: 70.0000 mg | ORAL_TABLET | ORAL | 11 refills | Status: AC

## 2017-10-10 MED ORDER — ACCU-CHEK SOFTCLIX LANCETS MISC: 7 refills | Status: DC

## 2017-10-10 NOTE — Assessment & Plan Note (Signed)
We will check hep C today. She was advised to make her colonoscopy appointment. She was given Pneumovax 23 today.

## 2017-10-10 NOTE — Patient Instructions (Addendum)
Thank you for visiting clinic today. Your blood pressure seems better today, please continue your current medicines. Please discontinue Pravachol and continue taking atorvastatin as we discussed. Please pick up your vitamin D prescription from pharmacy, and take it 1 tablet every week for 6 weeks. As we discussed you were supposed to take Fosamax or alendronate every week with a full glass of water and stays sitting for half an hour after that. We will check your blood workup today and will call you with any abnormal results. These follow-up in 58-month.  Alendronate tablets What is this medicine? ALENDRONATE (a LEN droe nate) slows calcium loss from bones. It helps to make normal healthy bone and to slow bone loss in people with Paget's disease and osteoporosis. It may be used in others at risk for bone loss. This medicine may be used for other purposes; ask your health care provider or pharmacist if you have questions. COMMON BRAND NAME(S): Fosamax What should I tell my health care provider before I take this medicine? They need to know if you have any of these conditions: -dental disease -esophagus, stomach, or intestine problems, like acid reflux or GERD -kidney disease -low blood calcium -low vitamin D -problems sitting or standing 30 minutes -trouble swallowing -an unusual or allergic reaction to alendronate, other medicines, foods, dyes, or preservatives -pregnant or trying to get pregnant -breast-feeding How should I use this medicine? You must take this medicine exactly as directed or you will lower the amount of the medicine you absorb into your body or you may cause yourself harm. Take this medicine by mouth first thing in the morning, after you are up for the day. Do not eat or drink anything before you take your medicine. Swallow the tablet with a full glass (6 to 8 fluid ounces) of plain water. Do not take this medicine with any other drink. Do not chew or crush the tablet.  After taking this medicine, do not eat breakfast, drink, or take any medicines or vitamins for at least 30 minutes. Sit or stand up for at least 30 minutes after you take this medicine; do not lie down. Do not take your medicine more often than directed. Talk to your pediatrician regarding the use of this medicine in children. Special care may be needed. Overdosage: If you think you have taken too much of this medicine contact a poison control center or emergency room at once. NOTE: This medicine is only for you. Do not share this medicine with others. What if I miss a dose? If you miss a dose, do not take it later in the day. Continue your normal schedule starting the next morning. Do not take double or extra doses. What may interact with this medicine? -aluminum hydroxide -antacids -aspirin -calcium supplements -drugs for inflammation like ibuprofen, naproxen, and others -iron supplements -magnesium supplements -vitamins with minerals This list may not describe all possible interactions. Give your health care provider a list of all the medicines, herbs, non-prescription drugs, or dietary supplements you use. Also tell them if you smoke, drink alcohol, or use illegal drugs. Some items may interact with your medicine. What should I watch for while using this medicine? Visit your doctor or health care professional for regular checks ups. It may be some time before you see benefit from this medicine. Do not stop taking your medicine except on your doctor's advice. Your doctor or health care professional may order blood tests and other tests to see how you are doing. You should  make sure you get enough calcium and vitamin D while you are taking this medicine, unless your doctor tells you not to. Discuss the foods you eat and the vitamins you take with your health care professional. Some people who take this medicine have severe bone, joint, and/or muscle pain. This medicine may also increase your  risk for a broken thigh bone. Tell your doctor right away if you have pain in your upper leg or groin. Tell your doctor if you have any pain that does not go away or that gets worse. This medicine can make you more sensitive to the sun. If you get a rash while taking this medicine, sunlight may cause the rash to get worse. Keep out of the sun. If you cannot avoid being in the sun, wear protective clothing and use sunscreen. Do not use sun lamps or tanning beds/booths. What side effects may I notice from receiving this medicine? Side effects that you should report to your doctor or health care professional as soon as possible: -allergic reactions like skin rash, itching or hives, swelling of the face, lips, or tongue -black or tarry stools -bone, muscle or joint pain -changes in vision -chest pain -heartburn or stomach pain -jaw pain, especially after dental work -pain or trouble when swallowing -redness, blistering, peeling or loosening of the skin, including inside the mouth Side effects that usually do not require medical attention (report to your doctor or health care professional if they continue or are bothersome): -changes in taste -diarrhea or constipation -eye pain or itching -headache -nausea or vomiting -stomach gas or fullness This list may not describe all possible side effects. Call your doctor for medical advice about side effects. You may report side effects to FDA at 1-800-FDA-1088. Where should I keep my medicine? Keep out of the reach of children. Store at room temperature of 15 and 30 degrees C (59 and 86 degrees F). Throw away any unused medicine after the expiration date. NOTE: This sheet is a summary. It may not cover all possible information. If you have questions about this medicine, talk to your doctor, pharmacist, or health care provider.  2018 Elsevier/Gold Standard (2011-03-08 08:56:09)

## 2017-10-10 NOTE — Assessment & Plan Note (Signed)
She was on Pravachol, which was switched to Lipitor as her recent lipid profile shows higher ASCVD.  Apparently patient was taking both of them.  She was advised to stop taking Pravachol and continue taking Lipitor 40 mg daily.

## 2017-10-10 NOTE — Assessment & Plan Note (Signed)
BP Readings from Last 3 Encounters:  10/10/17 124/66  09/05/17 (!) 155/73  07/18/17 (!) 146/62   On initial check her blood pressure was mildly elevated at 157/70 which improved on recheck. Lasix 20 mg was added during previous clinic visit and patient is compliant with that. Her BMP during last clinic visit shows mild hyperkalemia which improved after taking an extra dose of Lasix on recheck, but recheck shows hyponatremia, patient refused to come for another blood workup at that time and she remained asymptomatic.  -Recheck BMP today. -Continue current management with verapamil, Lasix and lisinopril.

## 2017-10-10 NOTE — Assessment & Plan Note (Signed)
Her A1c in December 2018 was 7.5.  He did not bring her glucometer today and states that whenever she checks her blood sugar it remained between 135-140 with couple of readings in the low 200s.  According to patient she had a couple of readings when her blood sugar dropped to 70s, she do become symptomatic but her symptoms quickly resolved after drinking orange juice.  Continue current management, she was advised to bring glucometer during next follow-up visit.

## 2017-10-10 NOTE — Assessment & Plan Note (Signed)
She was given prescription for Fosamax 70 mg weekly, apparently should took 5 pills consecutively one each day.  She was also found to have vitamin D level at 7, vitamin D 50,000 units/week prescription was sent to her pharmacy which she never picked it up yet. Patient is taking daily calcium supplement and vitamin D 2000 units.  She was counseled on taking Fosamax again, new information and instructions were provided. He was also instructed to pick up her vitamin D prescription and take it as advised 1 pill/week for 6 weeks.  We will repeat vitamin D level in 59-month. -She needs a repeat DEXA scan in 2-year.

## 2017-10-10 NOTE — Progress Notes (Signed)
   CC: For follow-up of her diabetes and hypertension.  HPI:  [name] is a 62 y.o. with past medical history as listed below came to the clinic for follow-up of her diabetes and hypertension.  She has no complaints today. Please see assessment and plan for her chronic conditions.  Past Medical History:  Diagnosis Date  . Diabetes mellitus   . Ganglion cyst    right wrist  . GERD (gastroesophageal reflux disease)   . History of radiation therapy 02/27/10 -04/13/10   left base of tongue, bilat neck  . Hyperlipidemia   . Hypertension   . Iron deficiency anemia   . 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 Menopause   . Thyroid disease    Hyperthyroidism  . Tobacco abuse    PT HAS QUIT   Review of Systems: Negative except mentioned in HPI.  Physical Exam:  Vitals:   10/10/17 1345 10/10/17 1417  BP: (!) 157/70 124/66  Pulse: (!) 102 100  Temp: 98.2 F (36.8 C)   TempSrc: Oral   SpO2: 100%   Weight: 130 lb 6.4 oz (59.1 kg)     General: Vital signs reviewed.  Patient is well-developed and well-nourished, in no acute distress and cooperative with exam.  Head: Normocephalic and atraumatic. Eyes: EOMI, conjunctivae normal, no scleral icterus.  Neck: Supple, trachea midline, normal ROM, no JVD, masses, thyromegaly, or carotid bruit present.  Cardiovascular: RRR, S1 normal, S2 normal, no murmurs, gallops, or rubs. Pulmonary/Chest: Clear to auscultation bilaterally, no wheezes, rales, or rhonchi. Abdominal: Soft, non-tender, non-distended, BS +, no masses, organomegaly, or guarding present.  Extremities: No lower extremity edema bilaterally,  pulses symmetric and intact bilaterally. No cyanosis or clubbing. Skin: Warm, dry and intact. No rashes or erythema. Psychiatric: Normal mood and affect. speech and behavior is normal. Cognition and memory are normal.  Assessment & Plan:   See Encounters Tab for problem based charting.  Patient discussed  with Dr. Angelia Mould.

## 2017-10-11 LAB — BMP8+ANION GAP
ANION GAP: 19 mmol/L — AB (ref 10.0–18.0)
BUN/Creatinine Ratio: 15 (ref 12–28)
BUN: 17 mg/dL (ref 8–27)
CO2: 22 mmol/L (ref 20–29)
Calcium: 10.6 mg/dL — ABNORMAL HIGH (ref 8.7–10.3)
Chloride: 90 mmol/L — ABNORMAL LOW (ref 96–106)
Creatinine, Ser: 1.17 mg/dL — ABNORMAL HIGH (ref 0.57–1.00)
GFR calc Af Amer: 58 mL/min/{1.73_m2} — ABNORMAL LOW (ref >59)
GFR, EST NON AFRICAN AMERICAN: 50 mL/min/{1.73_m2} — AB (ref >59)
GLUCOSE: 116 mg/dL — AB (ref 65–99)
POTASSIUM: 4.4 mmol/L (ref 3.5–5.2)
SODIUM: 131 mmol/L — AB (ref 134–144)

## 2017-10-11 LAB — HEPATITIS C ANTIBODY

## 2017-10-15 NOTE — Progress Notes (Signed)
Internal Medicine Clinic Attending  Case discussed with Dr. Amin at the time of the visit.  We reviewed the resident's history and exam and pertinent patient test results.  I agree with the assessment, diagnosis, and plan of care documented in the resident's note.    

## 2017-10-27 ENCOUNTER — Other Ambulatory Visit: Payer: Self-pay | Admitting: Internal Medicine

## 2017-10-27 DIAGNOSIS — I10 Essential (primary) hypertension: Secondary | ICD-10-CM

## 2017-10-27 MED ORDER — LISINOPRIL 40 MG PO TABS: 40.0000 mg | ORAL_TABLET | Freq: Every day | ORAL | 1 refills | Status: DC

## 2017-10-27 MED ORDER — VERAPAMIL HCL ER 240 MG PO TBCR: 360.0000 mg | EXTENDED_RELEASE_TABLET | Freq: Every day | ORAL | 1 refills | Status: DC

## 2017-10-27 NOTE — Telephone Encounter (Signed)
Patient is requesting a refill on blood pills

## 2017-11-13 ENCOUNTER — Telehealth: Payer: Self-pay | Admitting: *Deleted

## 2017-11-13 NOTE — Telephone Encounter (Signed)
Pt calls and states she needs more of her f medicine, determined it to be furosemide she states she has been taking 2 daily and then she states that doctor told her to take 5 then not to, determined she was talking about fosamax, she must call transportation and wait 3 days, appt is made for wed 2/27. She is ask to take only 1 furosemide daily til then and to hod off fosamax, also to bring in all meds for appt. She is agreeable, may need to put her in contact with a pharm that does bubble daily packs

## 2017-11-14 NOTE — Telephone Encounter (Signed)
Thanks. Susan Evans

## 2017-11-18 ENCOUNTER — Ambulatory Visit (INDEPENDENT_AMBULATORY_CARE_PROVIDER_SITE_OTHER): Payer: Medicaid Other | Admitting: Internal Medicine

## 2017-11-18 VITALS — BP 137/109 | HR 95 | Temp 98.3°F | Wt 132.5 lb

## 2017-11-18 DIAGNOSIS — I10 Essential (primary) hypertension: Secondary | ICD-10-CM | POA: Diagnosis not present

## 2017-11-18 NOTE — Progress Notes (Signed)
   CC: Blood pressure follow-up  HPI:  Susan Evans is a 62 y.o. f with history of essential hypertension, squamous cell carcinoma of pharynx, diabetes mellitus, ckd 3, and who presents for blood pressure follow-up. Please see problem based charting for evaluation, assessment, and plan.  Past Medical History:  Diagnosis Date  . Diabetes mellitus   . Ganglion cyst    right wrist  . GERD (gastroesophageal reflux disease)   . History of radiation therapy 02/27/10 -04/13/10   left base of tongue, bilat neck  . Hyperlipidemia   . Hypertension   . Iron deficiency anemia   . 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 Menopause   . Thyroid disease    Hyperthyroidism  . Tobacco abuse    PT HAS QUIT   Review of Systems: As noted in history  Physical Exam:  Vitals:   11/18/17 0833  BP: (!) 137/109  Pulse: 95  Temp: 98.3 F (36.8 C)  TempSrc: Oral  SpO2: 100%  Weight: 132 lb 8 oz (60.1 kg)   Physical Exam  Constitutional: She appears well-developed and well-nourished. No distress.  HENT:  Head: Normocephalic and atraumatic.  Eyes: Conjunctivae are normal.  Cardiovascular: Normal rate, regular rhythm and normal heart sounds.  Respiratory: Effort normal and breath sounds normal. No respiratory distress. She has no wheezes.  GI: Soft. Bowel sounds are normal. She exhibits no distension. There is no tenderness.  Musculoskeletal: She exhibits no edema.  Neurological: She is alert.  Skin: She is not diaphoretic.  Psychiatric: She has a normal mood and affect. Her behavior is normal. Judgment and thought content normal.     Assessment & Plan:   See Encounters Tab for problem based charting.  Essential Hypertension Patient was requested to come into clinic by triage nurse as she the patient had called in for refill of her Lasix medication and it was determined that she was using a 2 tablets of Lasix 20 mg instead of once daily.  And states that she was  previously on increased Lasix regimen and was confused about how much she should be taking per day.  Therefore, she has been taking an increased dose.  The patient denies any dizziness, lightheadedness, chest pain, shortness of breath, peripheral edema, weight changes, chest pain.  Assessment Due to misunderstanding the patient has been taking an of Lasix.  The patient has been having low sodium levels ranging 125-136 over the past 2 months to be due renal loss from excessive diuresis.  Therefore, we will recheck BMP to evaluate sodium level and also check potassium at that time.  The small increase in Lasix dosage is not likely to cause an extreme sodium imbalance, but we will check BMP to ensure.  Plan -Educated the patient on taking Lasix 20 mg daily -Ordered bmp   Patient discussed with Dr. Evette Doffing

## 2017-11-18 NOTE — Patient Instructions (Addendum)
It was a pleasure to see you today [name]. Please make the following changes:  -Please take Furosemide 20mg  daily  If you have any questions or concerns, please call our clinic at [phone] between 9am-5pm and after hours call [phone] and ask for the internal medicine resident on call. If you feel you are having a medical emergency please call 911.   Thank you, we look forward to help you remain healthy!  Lars Mage, MD Internal Medicine PGY1

## 2017-11-19 LAB — BMP8+ANION GAP
Anion Gap: 20 mmol/L — ABNORMAL HIGH (ref 10.0–18.0)
BUN / CREAT RATIO: 10 — AB (ref 12–28)
BUN: 11 mg/dL (ref 8–27)
CO2: 22 mmol/L (ref 20–29)
CREATININE: 1.06 mg/dL — AB (ref 0.57–1.00)
Calcium: 10.3 mg/dL (ref 8.7–10.3)
Chloride: 94 mmol/L — ABNORMAL LOW (ref 96–106)
GFR calc non Af Amer: 57 mL/min/{1.73_m2} — ABNORMAL LOW (ref >59)
GFR, EST AFRICAN AMERICAN: 65 mL/min/{1.73_m2} (ref >59)
Glucose: 243 mg/dL — ABNORMAL HIGH (ref 65–99)
Potassium: 4.7 mmol/L (ref 3.5–5.2)
SODIUM: 136 mmol/L (ref 134–144)

## 2017-11-19 NOTE — Progress Notes (Signed)
Internal Medicine Clinic Attending  Case discussed with Dr. Chundi at the time of the visit.  We reviewed the resident's history and exam and pertinent patient test results.  I agree with the assessment, diagnosis, and plan of care documented in the resident's note. 

## 2017-11-19 NOTE — Assessment & Plan Note (Signed)
Patient was requested to come into clinic by triage nurse as she the patient had called in for refill of her Lasix medication and it was determined that she was using a 2 tablets of Lasix 20 mg instead of once daily.  And states that she was previously on increased Lasix regimen and was confused about how much she should be taking per day.  Therefore, she has been taking an increased dose.  The patient denies any dizziness, lightheadedness, chest pain, shortness of breath, peripheral edema, weight changes, chest pain.  Assessment Due to misunderstanding the patient has been taking an of Lasix.  The patient has been having low sodium levels ranging 125-136 over the past 2 months to be due renal loss from excessive diuresis.  Therefore, we will recheck BMP to evaluate sodium level and also check potassium at that time.  The small increase in Lasix dosage is not likely to cause an extreme sodium imbalance, but we will check BMP to ensure.  Plan -Educated the patient on taking Lasix 20 mg daily -Ordered bmp

## 2017-11-24 ENCOUNTER — Other Ambulatory Visit: Payer: Self-pay | Admitting: Internal Medicine

## 2017-11-24 DIAGNOSIS — E119 Type 2 diabetes mellitus without complications: Secondary | ICD-10-CM

## 2017-11-25 ENCOUNTER — Other Ambulatory Visit: Payer: Self-pay | Admitting: Internal Medicine

## 2017-11-25 DIAGNOSIS — E119 Type 2 diabetes mellitus without complications: Secondary | ICD-10-CM

## 2017-11-30 ENCOUNTER — Other Ambulatory Visit: Payer: Self-pay | Admitting: Internal Medicine

## 2017-12-01 ENCOUNTER — Other Ambulatory Visit: Payer: Self-pay | Admitting: Internal Medicine

## 2017-12-01 DIAGNOSIS — E785 Hyperlipidemia, unspecified: Secondary | ICD-10-CM

## 2017-12-01 NOTE — Telephone Encounter (Signed)
Next appt scheduled  3/29 with PCP.

## 2017-12-02 NOTE — Telephone Encounter (Signed)
11 refills of atorvastatin were sent on 09/05/2017. Spoke with Caryl Pina at [facility]. States patient mistakenly told them she was no longer taking this med so rx became inactivated. Will need new rx sent. Hubbard Hartshorn, RN, BSN

## 2017-12-05 ENCOUNTER — Other Ambulatory Visit: Payer: Self-pay | Admitting: Internal Medicine

## 2017-12-05 DIAGNOSIS — E119 Type 2 diabetes mellitus without complications: Secondary | ICD-10-CM

## 2017-12-05 NOTE — Telephone Encounter (Signed)
Patient calling about refills

## 2017-12-19 ENCOUNTER — Encounter: Payer: Self-pay | Admitting: Internal Medicine

## 2017-12-19 ENCOUNTER — Other Ambulatory Visit: Payer: Self-pay

## 2017-12-19 ENCOUNTER — Ambulatory Visit: Payer: Medicaid Other | Admitting: Internal Medicine

## 2017-12-19 VITALS — BP 137/76 | HR 97 | Temp 98.4°F | Ht 62.0 in | Wt 127.0 lb

## 2017-12-19 DIAGNOSIS — J309 Allergic rhinitis, unspecified: Secondary | ICD-10-CM

## 2017-12-19 DIAGNOSIS — E118 Type 2 diabetes mellitus with unspecified complications: Secondary | ICD-10-CM

## 2017-12-19 DIAGNOSIS — E119 Type 2 diabetes mellitus without complications: Secondary | ICD-10-CM | POA: Diagnosis not present

## 2017-12-19 DIAGNOSIS — Z79899 Other long term (current) drug therapy: Secondary | ICD-10-CM | POA: Diagnosis not present

## 2017-12-19 DIAGNOSIS — Z0000 Encounter for general adult medical examination without abnormal findings: Secondary | ICD-10-CM

## 2017-12-19 DIAGNOSIS — I10 Essential (primary) hypertension: Secondary | ICD-10-CM | POA: Diagnosis not present

## 2017-12-19 DIAGNOSIS — Z794 Long term (current) use of insulin: Secondary | ICD-10-CM | POA: Diagnosis not present

## 2017-12-19 DIAGNOSIS — M810 Age-related osteoporosis without current pathological fracture: Secondary | ICD-10-CM

## 2017-12-19 DIAGNOSIS — Z7983 Long term (current) use of bisphosphonates: Secondary | ICD-10-CM

## 2017-12-19 LAB — POCT GLYCOSYLATED HEMOGLOBIN (HGB A1C): Hemoglobin A1C: 7.6

## 2017-12-19 LAB — GLUCOSE, CAPILLARY: Glucose-Capillary: 212 mg/dL — ABNORMAL HIGH (ref 65–99)

## 2017-12-19 MED ORDER — LORATADINE 10 MG PO TABS: 10.0000 mg | ORAL_TABLET | Freq: Every day | ORAL | 2 refills | Status: DC

## 2017-12-19 MED ORDER — INSULIN ASPART 100 UNIT/ML FLEXPEN: PEN_INJECTOR | SUBCUTANEOUS | 6 refills | Status: DC

## 2017-12-19 MED ORDER — INSULIN GLARGINE 100 UNIT/ML ~~LOC~~ SOLN: SUBCUTANEOUS | 4 refills | Status: DC

## 2017-12-19 NOTE — Assessment & Plan Note (Signed)
She is compliant with Fosamax-no complaints.  Continue Fosamax 70 mg/week.

## 2017-12-19 NOTE — Progress Notes (Signed)
   CC: For follow-up of her hypertension and diabetes.  HPI:  [name] is a 62 y.o. with past medical history as listed below came to the clinic to follow-up of her diabetes and hypertension.  Overall she is feeling better and had no complaints today.  She is trying to exercise regularly by daily walk and trying to lose some weight.  Please see assessment and plan for her chronic conditions.  Past Medical History:  Diagnosis Date  . Diabetes mellitus   . Ganglion cyst    right wrist  . GERD (gastroesophageal reflux disease)   . History of radiation therapy 02/27/10 -04/13/10   left base of tongue, bilat neck  . Hyperlipidemia   . Hypertension   . Iron deficiency anemia   . 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 Menopause   . Thyroid disease    Hyperthyroidism  . Tobacco abuse    PT HAS QUIT   Review of Systems: Negative except mentioned in HPI.  Physical Exam:  Vitals:   12/19/17 1315  BP: 137/76  Pulse: 97  Temp: 98.4 F (36.9 C)  TempSrc: Oral  SpO2: 100%  Weight: 127 lb (57.6 kg)  Height: 5\' 2"  (1.575 m)    General: Vital signs reviewed.  Patient is well-developed and well-nourished, in no acute distress and cooperative with exam.  Head: Normocephalic and atraumatic. Eyes: EOMI, conjunctivae normal, no scleral icterus.  Cardiovascular: RRR, S1 normal, S2 normal, no murmurs, gallops, or rubs. Pulmonary/Chest: Clear to auscultation bilaterally, no wheezes, rales, or rhonchi. Abdominal: Soft, non-tender, non-distended, BS +, no masses, organomegaly, or guarding present.  Extremities: No lower extremity edema bilaterally,  pulses symmetric and intact bilaterally. No cyanosis or clubbing. Skin: Warm, dry and intact. No rashes or erythema. Psychiatric: Flat affect.  Assessment & Plan:   See Encounters Tab for problem based charting.  Patient discussed with Dr. Lynnae January.

## 2017-12-19 NOTE — Assessment & Plan Note (Signed)
She is still not ready for colonoscopy. She will let me know once she is ready.  She is  up to date for rest of her health maintenance.

## 2017-12-19 NOTE — Assessment & Plan Note (Signed)
BP Readings from Last 3 Encounters:  12/19/17 137/76  11/18/17 (!) 137/109  10/10/17 124/66   Blood pressure was within goal. Patient denies any dizziness.  No chest pain or shortness of breath.  Continue current management with lisinopril 40 mg daily, verapamil 360 mg daily and Lasix 20 mg daily.

## 2017-12-19 NOTE — Assessment & Plan Note (Signed)
Her A1c today was 7.6, pretty much similar to her previous A1c of 7.5 done in December 2018.  She did bring her glucometer which shows an average blood glucose level of 167, highest at 368 and low start 56. She had multiple dose below 70, patient to become mildly symptomatic when her blood sugar dropped below 70, eating some food always help with the symptoms. She has a lot of variability during the whole day, there are days when her blood sugar dropped just a few points after dinner, and there are days when blood sugar dropped drastically according to patient she is very compliant and consistent with her insulin use, uses Lantus 32 units in the morning and 30 units at bedtime along with NovoLog 7 units with breakfast, 10 units with lunch and 8 units with dinner. She was unable to explain her dietary habits-I am suspecting variability in her eating habits is causing this much variability in her blood glucose level.  -Our first goal is to get rid of those hypoglycemic events. -Decrease Lantus to 30 units in the morning and 28 units at bedtime- -asked the patient to keep a diary of her food for next [redacted] weeks along with time and amount of NovoLog she use and bring that log along with glucometer in 2 weeks to clinic for further evaluation. -She was also referred to see Butch Penny for more diabetic diet education and carbohydrate count. -She will get benefit in the future by adding sitagliptin to her current regimen. She do not want to add another pill-we will give her a prescription of Janumet 50-1000 twice daily if needed.

## 2017-12-19 NOTE — Progress Notes (Signed)
Internal Medicine Clinic Attending  Case discussed with Dr. Amin at the time of the visit.  We reviewed the resident's history and exam and pertinent patient test results.  I agree with the assessment, diagnosis, and plan of care documented in the resident's note.    

## 2017-12-19 NOTE — Patient Instructions (Addendum)
Thank you for visiting clinic today. I am decreasing the dose of your Lantus now you will take 30 units in the morning and 28 units at bedtime in order to prevent your low blood sugar. Please keep your diet very consistent, make a diary of the food you eat along with time and the amount of insulin and bring the diary with you to your next appointment in 2 weeks. I am also referring you to see Donna-she can help you manage your diet. Please follow-up in 2 weeks with your food diary and glucometer.

## 2018-01-02 ENCOUNTER — Encounter: Payer: Medicaid Other | Admitting: Internal Medicine

## 2018-01-13 ENCOUNTER — Telehealth: Payer: Self-pay | Admitting: Dietician

## 2018-01-13 ENCOUNTER — Ambulatory Visit (INDEPENDENT_AMBULATORY_CARE_PROVIDER_SITE_OTHER): Payer: Medicaid Other | Admitting: Dietician

## 2018-01-13 DIAGNOSIS — Z794 Long term (current) use of insulin: Secondary | ICD-10-CM

## 2018-01-13 DIAGNOSIS — E119 Type 2 diabetes mellitus without complications: Secondary | ICD-10-CM

## 2018-01-13 LAB — GLUCOSE, CAPILLARY: Glucose-Capillary: 196 mg/dL — ABNORMAL HIGH (ref 65–99)

## 2018-01-13 NOTE — Patient Instructions (Signed)
Let's do  1- 20 units lantus two times a day for the next week and 2-  keep track of food eaten and  3- check blood sugar before and 2 hours after you eat and bring with you

## 2018-01-13 NOTE — Telephone Encounter (Signed)
Changed appointment due to transportation

## 2018-01-13 NOTE — Progress Notes (Signed)
Diabetes Self-Management Education  Visit Type:  Follow-up  Appt. Start Time: 0910 Appt. End Time: 1005  01/13/2018  [name], identified by name and date of birth, is a 62 y.o. female with a diagnosis of Diabetes:  Marland Kitchen  Type 2 diabetes  ASSESSMENT Gave her a new battery for her meter. Meter download x 90 days showed: average- 164 Range 57-451 Pattern- drop from bedtime to fasting, then in range for most of day   Lab Results  Component Value Date   HGBA1C 7.6 12/19/2017    Weight 132- 127# intentionally " have been cutting back, isn't that good? "  Prefers bedtime CBG to be 160-170.  She is fine being 70-80s in am or during the day. Only <70 bothers her. ( has symptoms)    Diabetes Self-Management Education - 01/13/18 1000      Health Coping   How would you rate your overall health?  Good      Psychosocial Assessment   Patient Belief/Attitude about Diabetes  Motivated to manage diabetes fearful of hypoglycemia    Self-care barriers  Lack of transportation;Lack of material resources    Self-management support  Doctor's office;Family;CDE visits    Patient Concerns  Glycemic Control    Special Needs  None    Preferred Learning Style  Auditory;Visual    Learning Readiness  Ready      Pre-Education Assessment   Patient understands incorporating nutritional management into lifestyle.  Needs Review    Patient understands using medications safely.  Needs Review    Patient understands monitoring blood glucose, interpreting and using results  Needs Review      Complications   Last HgB A1C per patient/outside source  7.6 %    How often do you check your blood sugar?  > 4 times/day 4.3x/day    Fasting Blood glucose range (mg/dL)  70-129;130-179    Postprandial Blood glucose range (mg/dL)  180-200;>200    Number of hypoglycemic episodes per month  1    Can you tell when your blood sugar is low?  Yes    What do you do if your blood sugar is low?  eats sugary foods- too  much    Number of hyperglycemic episodes per week  2    Can you tell when your blood sugar is high?  Yes    What do you do if your blood sugar is high?  takes insulin, eats less    Have you had a dilated eye exam in the past 12 months?  Yes    Have you had a dental exam in the past 12 months?  No dentures    Are you checking your feet?  Yes    How many days per week are you checking your feet?  7      Dietary Intake   Breakfast  instant oatmeal, 1/2 banana, 1/2 orange, coffee rise in CBG with 7 u Nov was 59 2 hr after      Exercise   Exercise Type  ADL's;Light (walking / raking leaves)    How many days per week to you exercise?  5    How many minutes per day do you exercise?  30    Total minutes per week of exercise  150      Patient Education   Previous Diabetes Education  Yes (please comment) here many times      Subsequent Visit   Since your last visit have you continued or begun to take  your medications as prescribed?  No lowering her lantus or not taking what is prescribed     Since your last visit have you had your blood pressure checked?  Yes    Is your most recent blood pressure lower, unchanged, or higher since your last visit?  Lower    Since your last visit have you experienced any weight changes?  Loss    Weight Loss (lbs)  5    Since your last visit, are you checking your blood glucose at least once a day?  Yes       Learning Objective:  Patient will have a greater understanding of diabetes self-management. Patient education plan is to attend individual and/or group sessions per assessed needs and concerns.   Plan:   Patient Instructions  Let's do  1- 20 units lantus two times a day for the next week and 2-  keep track of food eaten and  3- check blood sugar before and 2 hours after you eat and bring with you     Expected Outcomes:   good she qualifies and would benefit from CGM when her insurance covers it and she is willing.   Education material provided:  Carbohydrate counting sheet  If problems or questions, patient to contact team via:  Phone  Future DSME appointment: -  1 week Debera Lat, RD 01/13/2018 10:30 AM.

## 2018-01-21 ENCOUNTER — Ambulatory Visit: Payer: Medicaid Other | Admitting: Dietician

## 2018-01-23 ENCOUNTER — Encounter: Payer: Self-pay | Admitting: Internal Medicine

## 2018-01-23 ENCOUNTER — Ambulatory Visit: Payer: Medicaid Other | Admitting: Internal Medicine

## 2018-01-23 ENCOUNTER — Other Ambulatory Visit: Payer: Self-pay

## 2018-01-23 DIAGNOSIS — Z79899 Other long term (current) drug therapy: Secondary | ICD-10-CM

## 2018-01-23 DIAGNOSIS — E119 Type 2 diabetes mellitus without complications: Secondary | ICD-10-CM

## 2018-01-23 DIAGNOSIS — E785 Hyperlipidemia, unspecified: Secondary | ICD-10-CM | POA: Diagnosis not present

## 2018-01-23 DIAGNOSIS — Z7984 Long term (current) use of oral hypoglycemic drugs: Secondary | ICD-10-CM

## 2018-01-23 DIAGNOSIS — I10 Essential (primary) hypertension: Secondary | ICD-10-CM

## 2018-01-23 DIAGNOSIS — Z0000 Encounter for general adult medical examination without abnormal findings: Secondary | ICD-10-CM

## 2018-01-23 LAB — GLUCOSE, CAPILLARY: Glucose-Capillary: 131 mg/dL — ABNORMAL HIGH (ref 65–99)

## 2018-01-23 MED ORDER — SITAGLIPTIN PHOS-METFORMIN HCL 50-1000 MG PO TABS: 1.0000 | ORAL_TABLET | Freq: Two times a day (BID) | ORAL | 2 refills | Status: DC

## 2018-01-23 MED ORDER — INSULIN GLARGINE 100 UNIT/ML ~~LOC~~ SOLN: SUBCUTANEOUS | 4 refills | Status: DC

## 2018-01-23 NOTE — Assessment & Plan Note (Signed)
Patient finally agreed to proceed with colonoscopy, according to her she is going for her procedure end of this month.

## 2018-01-23 NOTE — Assessment & Plan Note (Signed)
She is tolerating Lipitor 40 mg daily very well.  Continue current dose of Lipitor.

## 2018-01-23 NOTE — Patient Instructions (Signed)
Thank you for visiting clinic today. As we discussed I am making changes to your Metformin, not you will stop taking Metformin, instead you will take this new medicine called Janumet which is a combination of metformin and sitagliptin. Continue taking Lantus 20 units twice daily. Please check your blood pressure at home and make a log-bring that log with you during next follow-up appointment in 73-month.

## 2018-01-23 NOTE — Assessment & Plan Note (Signed)
BP Readings from Last 3 Encounters:  01/23/18 (!) 155/76  12/19/17 137/76  11/18/17 (!) 137/109   Her blood pressure was elevated today. According to patient whenever she checked at home her blood pressure remained between 888- 280 systolic. She does not want any changes to her existing regimen.  She was told to bring a log of her blood pressure during next follow-up visit. Her amlodipine was discontinued in the past because of worsening proteinuria and verapamil was added. Spironolactone was discontinue after having some hyperkalemia-instead Lasix 20 mg was started. We will continue current regimen of lisinopril 40 mg daily, verapamil 360 mg daily and Lasix 20 mg daily. Patient do not want to make any changes to current regimen. We will reevaluate during next follow-up visit.

## 2018-01-23 NOTE — Assessment & Plan Note (Signed)
Did bring her glucometer which shows only one low to 69 after decreasing her Lantus to 20 units twice daily by Butch Penny on January 12, 2018. Most of her blood sugar remained little above or close to 200. Her CBG in clinic was 131 today. A1c done on December 19, 2017 was 7.6.  After discussing with patient we will discontinue Metformin and started her on Janumet 50-1000 twice daily.  That will not change her pill burden as patient do not want it to add another pill.  Patient agreed with that. She does not want to follow-up in 1 month so we will follow-up in 84-month when she will be due for A1c.

## 2018-01-23 NOTE — Progress Notes (Signed)
   CC: For follow-up of her diabetes and hypertension.  HPI:  [name] is a 62 y.o. with past medical history as listed below came to the clinic for follow-up of her diabetes and hypertension.  She has no complaints today.  Please see assessment and plan for her chronic conditions.  Past Medical History:  Diagnosis Date  . Diabetes mellitus   . Ganglion cyst    right wrist  . GERD (gastroesophageal reflux disease)   . History of radiation therapy 02/27/10 -04/13/10   left base of tongue, bilat neck  . Hyperlipidemia   . Hypertension   . Iron deficiency anemia   . 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 Menopause   . Thyroid disease    Hyperthyroidism  . Tobacco abuse    PT HAS QUIT   Review of Systems: Negative except mentioned in HPI.  Physical Exam:  Vitals:   01/23/18 1523  BP: (!) 151/73  Pulse: 92  Temp: 98.1 F (36.7 C)  TempSrc: Oral  SpO2: 100%  Weight: 127 lb (57.6 kg)  Height: 5\' 2"  (1.575 m)    General: Vital signs reviewed.  Patient is well-developed and well-nourished, in no acute distress and cooperative with exam.  Head: Normocephalic and atraumatic. Eyes: EOMI, conjunctivae normal, no scleral icterus.   Cardiovascular: RRR, S1 normal, S2 normal, no murmurs, gallops, or rubs. Pulmonary/Chest: Clear to auscultation bilaterally, no wheezes, rales, or rhonchi. Abdominal: Soft, non-tender, non-distended, BS +, no masses, organomegaly, or guarding present.  Extremities: No lower extremity edema bilaterally,  pulses symmetric and intact bilaterally. No cyanosis or clubbing. Skin: Warm, dry and intact. No rashes or erythema. Psychiatric: Normal mood and affect. speech and behavior is normal. Cognition and memory are normal.  Assessment & Plan:   See Encounters Tab for problem based charting.  Patient discussed with Dr. Rebeca Alert.

## 2018-01-26 ENCOUNTER — Ambulatory Visit: Payer: Medicaid Other | Admitting: Dietician

## 2018-01-26 NOTE — Progress Notes (Signed)
Internal Medicine Clinic Attending  Case discussed with Dr. Reesa Chew  at the time of the visit.  We reviewed the resident's history and exam and pertinent patient test results.  I agree with the assessment, diagnosis, and plan of care documented in the resident's note.  Oda Kilts, MD

## 2018-01-27 ENCOUNTER — Other Ambulatory Visit: Payer: Self-pay | Admitting: Internal Medicine

## 2018-02-15 ENCOUNTER — Encounter: Payer: Self-pay | Admitting: *Deleted

## 2018-03-21 ENCOUNTER — Other Ambulatory Visit: Payer: Self-pay | Admitting: Internal Medicine

## 2018-03-21 DIAGNOSIS — I10 Essential (primary) hypertension: Secondary | ICD-10-CM

## 2018-03-23 ENCOUNTER — Encounter: Payer: Medicaid Other | Admitting: Internal Medicine

## 2018-03-27 ENCOUNTER — Encounter: Payer: Medicaid Other | Admitting: Internal Medicine

## 2018-04-22 ENCOUNTER — Other Ambulatory Visit: Payer: Self-pay | Admitting: Internal Medicine

## 2018-04-22 DIAGNOSIS — E785 Hyperlipidemia, unspecified: Secondary | ICD-10-CM

## 2018-05-08 ENCOUNTER — Other Ambulatory Visit: Payer: Self-pay | Admitting: Internal Medicine

## 2018-05-08 DIAGNOSIS — Z1231 Encounter for screening mammogram for malignant neoplasm of breast: Secondary | ICD-10-CM

## 2018-05-11 ENCOUNTER — Encounter: Payer: Medicaid Other | Admitting: Internal Medicine

## 2018-06-01 ENCOUNTER — Ambulatory Visit: Payer: Medicaid Other | Admitting: Internal Medicine

## 2018-06-01 ENCOUNTER — Encounter (INDEPENDENT_AMBULATORY_CARE_PROVIDER_SITE_OTHER): Payer: Self-pay

## 2018-06-01 ENCOUNTER — Encounter: Payer: Self-pay | Admitting: Internal Medicine

## 2018-06-01 ENCOUNTER — Encounter: Payer: Self-pay | Admitting: Dietician

## 2018-06-01 ENCOUNTER — Ambulatory Visit: Payer: Medicaid Other | Admitting: Dietician

## 2018-06-01 VITALS — BP 189/89 | HR 101 | Temp 98.2°F | Wt 127.8 lb

## 2018-06-01 DIAGNOSIS — N181 Chronic kidney disease, stage 1: Secondary | ICD-10-CM

## 2018-06-01 DIAGNOSIS — E1122 Type 2 diabetes mellitus with diabetic chronic kidney disease: Secondary | ICD-10-CM | POA: Diagnosis not present

## 2018-06-01 DIAGNOSIS — Z7689 Persons encountering health services in other specified circumstances: Secondary | ICD-10-CM | POA: Diagnosis not present

## 2018-06-01 DIAGNOSIS — Z79899 Other long term (current) drug therapy: Secondary | ICD-10-CM

## 2018-06-01 DIAGNOSIS — Z794 Long term (current) use of insulin: Secondary | ICD-10-CM | POA: Diagnosis not present

## 2018-06-01 DIAGNOSIS — I10 Essential (primary) hypertension: Secondary | ICD-10-CM | POA: Diagnosis not present

## 2018-06-01 DIAGNOSIS — I129 Hypertensive chronic kidney disease with stage 1 through stage 4 chronic kidney disease, or unspecified chronic kidney disease: Secondary | ICD-10-CM

## 2018-06-01 DIAGNOSIS — E119 Type 2 diabetes mellitus without complications: Secondary | ICD-10-CM

## 2018-06-01 LAB — GLUCOSE, CAPILLARY: Glucose-Capillary: 152 mg/dL — ABNORMAL HIGH (ref 70–99)

## 2018-06-01 LAB — POCT GLYCOSYLATED HEMOGLOBIN (HGB A1C): Hemoglobin A1C: 8.2 % — AB (ref 4.0–5.6)

## 2018-06-01 MED ORDER — CLONIDINE 0.1 MG/24HR TD PTWK: 0.1000 mg | MEDICATED_PATCH | TRANSDERMAL | 11 refills | Status: DC

## 2018-06-01 MED ORDER — GLUCOSE BLOOD VI STRP: 1.0000 | ORAL_STRIP | Freq: Every day | 11 refills | Status: DC

## 2018-06-01 NOTE — Progress Notes (Signed)
   CC: For follow-up of her diabetes and hypertension.  HPI:  Susan Evans is a 62 y.o. with past medical history as listed below came to the clinic for follow-up of her diabetes and hypertension.  She has no complaints today.  Please see assessment and plan for her chronic conditions.  Past Medical History:  Diagnosis Date  . Diabetes mellitus   . Ganglion cyst    right wrist  . GERD (gastroesophageal reflux disease)   . History of radiation therapy 02/27/10 -04/13/10   left base of tongue, bilat neck  . Hyperlipidemia   . Hypertension   . Iron deficiency anemia   . 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 Menopause   . Thyroid disease    Hyperthyroidism  . Tobacco abuse    PT HAS QUIT   Review of Systems: Negative except mentioned in HPI.  Physical Exam:  Vitals:   06/01/18 1430 06/01/18 1506  BP: (!) 208/84 (!) 189/89  Pulse: (!) 101 (!) 101  Temp: 98.2 F (36.8 C)   TempSrc: Oral   SpO2: 100%   Weight: 127 lb 12.8 oz (58 kg)    General: Vital signs reviewed.  Patient is well-developed and well-nourished, in no acute distress and cooperative with exam.  Head: Normocephalic and atraumatic. Eyes: EOMI, conjunctivae normal, no scleral icterus.  Cardiovascular: RRR, S1 normal, S2 normal, no murmurs, gallops, or rubs. Pulmonary/Chest: Clear to auscultation bilaterally, no wheezes, rales, or rhonchi. Abdominal: Soft, non-tender, non-distended, BS +, no masses, organomegaly, or guarding present.  Extremities: No lower extremity edema bilaterally,  pulses symmetric and intact bilaterally. No cyanosis or clubbing. Neurological: A&O x3, Strength is normal and symmetric bilaterally, cranial nerve II-XII are grossly intact, no focal motor deficit, sensory intact to light touch bilaterally.  Skin: Warm, dry and intact. No rashes or erythema. Psychiatric: Flat affect.   Assessment & Plan:   See Encounters Tab for problem based charting.  Patient  discussed with Dr. Rebeca Alert.

## 2018-06-01 NOTE — Progress Notes (Signed)
Patient lost meter a few weeks ago. Gave her a sample Accu chek [employer] meter.  She verbalizes understanding of how to use it and reports she has plenty of supplies. She agrees to check her blood sugar 4 times a day and return with her meter and the information collected. Agree with CGM when her insurer covers it per her request.  Follow up with diabetes educator due soon.  Susan Evans, RD 06/01/2018 4:43 PM.

## 2018-06-01 NOTE — Patient Instructions (Addendum)
Thank you for visiting clinic today. As we discussed I am adding a clonidine patch for your high blood pressure, you will change it every week. As we provided you with a new glucometer please check your blood glucose level 4 times a day and bring your glucometer reading with with you during your next follow-up appointment. Please follow-up in clinic in 2 weeks.  Clonidine skin patches What is this medicine? CLONIDINE (KLOE ni deen) is used to treat high blood pressure. This medicine may be used for other purposes; ask your health care provider or pharmacist if you have questions. COMMON BRAND NAME(S): Catapres-TTS What should I tell my health care provider before I take this medicine? They need to know if you have any of these conditions: -kidney disease -an unusual or allergic reaction to clonidine, other medicines, foods, dyes, or preservatives -pregnant or trying to get pregnant -breast-feeding How should I use this medicine? This medicine is for external use only. Follow the directions on the prescription label. Apply the patch to an area of the upper arm or part of the body that is clean, dry and hairless. Avoid injured, irritated, calloused, or scarred areas. Use a different site each time to prevent skin irritation. Do not cut or trim the patch. One patch should last for 7 days. Do not use your medicine more often than directed. Do not stop using except on the advice of your doctor or health care professional. You must gradually reduce the dose or you may get a dangerous increase in blood pressure. Talk to your pediatrician regarding the use of this medicine in children. Special care may be needed. Overdosage: If you think you have taken too much of this medicine contact a poison control center or emergency room at once. NOTE: This medicine is only for you. Do not share this medicine with others. What if I miss a dose? Replace each patch on the same day of each week, or if the patch  falls off. If you do forget to change the patch for two or three days, check with your doctor or health care professional. What may interact with this medicine? Do not take this medicine with any of the following medications: -MAOIs like Carbex, Eldepryl, Marplan, Nardil, and Parnate This medicine may also interact with the following medications: -barbiturate medicines for inducing sleep or treating seizures like phenobarbital -certain medicines for blood pressure, heart disease, irregular heart beat -certain medicines for depression, anxiety, or psychotic disturbances -prescription pain medicines This list may not describe all possible interactions. Give your health care provider a list of all the medicines, herbs, non-prescription drugs, or dietary supplements you use. Also tell them if you smoke, drink alcohol, or use illegal drugs. Some items may interact with your medicine. What should I watch for while using this medicine? Visit your doctor or health care professional for regular checks on your progress. Check your heart rate and blood pressure regularly while you are using this medicine. Ask your doctor or health care professional what your heart rate should be and when you should contact him or her. You can shower or bathe with the skin patch in position. If the patch gets loose, cover it with the extra adhesive overlay provided. You may get drowsy or dizzy. Do not drive, use machinery, or do anything that needs mental alertness until you know how this medicine affects you. To avoid dizzy or fainting spells, do not stand or sit up quickly, especially if you are an older person. Alcohol  can make you more drowsy and dizzy. Avoid alcoholic drinks. Your mouth may get dry. Chewing sugarless gum or sucking hard candy, and drinking plenty of water will help. Do not treat yourself for coughs, colds, or pain while you are using this medicine without asking your doctor or health care professional for  advice. Some ingredients may increase your blood pressure. If you are going to have surgery tell your doctor or health care professional that you are using this medicine. If you are going to have a magnetic resonance imaging (MRI) procedure, tell your MRI technician if you have this patch on your body. It must be removed before a MRI. What side effects may I notice from receiving this medicine? Side effects that you should report to your doctor or health care professional as soon as possible: -allergic reactions like skin rash, itching or hives, swelling of the face, lips, or tongue -anxiety, nervousness -chest pain -depression -fast, irregular heartbeat -swelling of feet or legs -unusually weak or tired Side effects that usually do not require medical attention (report to your doctor or health care professional if they continue or are bothersome): -change in sex drive or performance -constipation -headache -skin redness, irritation, or darkening under the patch area This list may not describe all possible side effects. Call your doctor for medical advice about side effects. You may report side effects to FDA at 1-800-FDA-1088. Where should I keep my medicine? Keep out of the reach of children. Store at room temperature between 15 and 30 degrees C (59 to 86 degrees F). Throw away any unused medicine after the expiration date. NOTE: This sheet is a summary. It may not cover all possible information. If you have questions about this medicine, talk to your doctor, pharmacist, or health care provider.  2018 Elsevier/Gold Standard (2015-05-22 19:41:74)

## 2018-06-02 DIAGNOSIS — N182 Chronic kidney disease, stage 2 (mild): Secondary | ICD-10-CM | POA: Diagnosis not present

## 2018-06-02 DIAGNOSIS — R809 Proteinuria, unspecified: Secondary | ICD-10-CM | POA: Diagnosis not present

## 2018-06-02 DIAGNOSIS — E611 Iron deficiency: Secondary | ICD-10-CM | POA: Diagnosis not present

## 2018-06-02 DIAGNOSIS — I129 Hypertensive chronic kidney disease with stage 1 through stage 4 chronic kidney disease, or unspecified chronic kidney disease: Secondary | ICD-10-CM | POA: Diagnosis not present

## 2018-06-02 DIAGNOSIS — E1129 Type 2 diabetes mellitus with other diabetic kidney complication: Secondary | ICD-10-CM | POA: Diagnosis not present

## 2018-06-02 NOTE — Assessment & Plan Note (Addendum)
BP Readings from Last 3 Encounters:  06/01/18 (!) 189/89  01/23/18 (!) 155/76  12/19/17 137/76   Her blood pressure was elevated today.  Patient is on maximum dose of lisinopril, verapamil at 360 mg daily and Lasix. She denies any headache, blurry vision, chest pain or shortness of breath.  Because of her elevated blood pressure on 3 agents, we will check for any causes of secondary hypertension. -Check for aldosterone/renin ratio. -We can consider renal duplex during next follow-up evaluation. -Add clonidine 0.1 mg/24h patch. -She has an recent history of mild hyperkalemia, she had CKD and is on lisinopril, adding spironolactone or potassium sparing can increase her risk.  Addendum.  Her aldosterone/renal ratio was within normal limit. Can consider doing a renal duplex studies if her blood pressure remained uncontrolled.

## 2018-06-02 NOTE — Assessment & Plan Note (Signed)
Patient did not bring her glucometer today, stating that she lost her glucometer while going on a family reunion few weeks ago.  She was using her sister's glucometer to intermittently checked her blood sugar and stating it remained between 140-170. She denies any hypoglycemic event. Her A1c today was 8.2 with a CBG of 152, her last A1c was 7.6.  Patient is on Janumet 50-1000 twice daily along with 20 units of Lantus and NovoLog 7-10-8.  She was provided with a new glucometer and advised to check it 3-4 times every day. She does not want to make any changes to her current diabetic regimen as we were adding another agent for her blood pressure today.  Next follow-up visit we can either change her insulin regimen according to her blood glucose reading, or can add a SGLT2 because of her CKD.  If an SGLT2 is going to be added she will need an counseling regarding getting euglycemic DKA as patient is on insulin too.  She is not type I she is insulin-dependent type 2 diabetic.

## 2018-06-02 NOTE — Assessment & Plan Note (Signed)
She follow-up with nephrology. Creatinine stable at this time.  Advised to keep following up with her nephrologist according to your scheduled appointment.

## 2018-06-04 LAB — ALDOSTERONE + RENIN ACTIVITY W/ RATIO
ALDOS/RENIN RATIO: 12.1 (ref 0.0–30.0)
ALDOSTERONE: 6.5 ng/dL (ref 0.0–30.0)
Renin: 0.537 ng/mL/hr (ref 0.167–5.380)

## 2018-06-04 NOTE — Progress Notes (Signed)
Internal Medicine Clinic Attending  Case discussed with Dr. Amin at the time of the visit.  We reviewed the resident's history and exam and pertinent patient test results.  I agree with the assessment, diagnosis, and plan of care documented in the resident's note.  Alexander Raines, M.D., Ph.D.  

## 2018-06-10 ENCOUNTER — Ambulatory Visit: Payer: Medicaid Other

## 2018-06-15 ENCOUNTER — Ambulatory Visit: Payer: Medicaid Other

## 2018-06-22 ENCOUNTER — Other Ambulatory Visit: Payer: Self-pay | Admitting: Internal Medicine

## 2018-06-22 ENCOUNTER — Ambulatory Visit: Payer: Medicaid Other | Admitting: Internal Medicine

## 2018-06-22 ENCOUNTER — Ambulatory Visit: Payer: Medicaid Other

## 2018-06-22 ENCOUNTER — Other Ambulatory Visit: Payer: Self-pay

## 2018-06-22 DIAGNOSIS — M810 Age-related osteoporosis without current pathological fracture: Secondary | ICD-10-CM | POA: Diagnosis not present

## 2018-06-22 DIAGNOSIS — Z85818 Personal history of malignant neoplasm of other sites of lip, oral cavity, and pharynx: Secondary | ICD-10-CM | POA: Diagnosis not present

## 2018-06-22 DIAGNOSIS — I10 Essential (primary) hypertension: Secondary | ICD-10-CM

## 2018-06-22 DIAGNOSIS — N189 Chronic kidney disease, unspecified: Secondary | ICD-10-CM

## 2018-06-22 DIAGNOSIS — E785 Hyperlipidemia, unspecified: Secondary | ICD-10-CM

## 2018-06-22 DIAGNOSIS — E119 Type 2 diabetes mellitus without complications: Secondary | ICD-10-CM

## 2018-06-22 DIAGNOSIS — Z794 Long term (current) use of insulin: Secondary | ICD-10-CM | POA: Diagnosis not present

## 2018-06-22 DIAGNOSIS — I129 Hypertensive chronic kidney disease with stage 1 through stage 4 chronic kidney disease, or unspecified chronic kidney disease: Secondary | ICD-10-CM | POA: Diagnosis not present

## 2018-06-22 DIAGNOSIS — E05 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: Secondary | ICD-10-CM

## 2018-06-22 DIAGNOSIS — Z7689 Persons encountering health services in other specified circumstances: Secondary | ICD-10-CM | POA: Diagnosis not present

## 2018-06-22 DIAGNOSIS — Z79899 Other long term (current) drug therapy: Secondary | ICD-10-CM | POA: Diagnosis not present

## 2018-06-22 DIAGNOSIS — E1122 Type 2 diabetes mellitus with diabetic chronic kidney disease: Secondary | ICD-10-CM

## 2018-06-22 MED ORDER — FUROSEMIDE 20 MG PO TABS: 20.0000 mg | ORAL_TABLET | Freq: Two times a day (BID) | ORAL | 11 refills | Status: DC

## 2018-06-22 MED ORDER — INSULIN GLARGINE 100 UNIT/ML ~~LOC~~ SOLN: SUBCUTANEOUS | 4 refills | Status: DC

## 2018-06-22 NOTE — Telephone Encounter (Signed)
Dr Alfonse Spruce not covered for Medicaid. Please re-send and check qty. Thanks

## 2018-06-22 NOTE — Patient Instructions (Addendum)
For your blood pressure: 1. Start taking 1 tablet of the furosemide 20 mg in the morning and 1 at night 2. Continue the lisinopril 40 mg once everyday 3. Start taking 1.5 tablets of the verapamil 240 mg everyday  For your diabetes: 1. Please start using the Lantus 15 units in the morning and at night 2. Restart your Janumet (sitagliptin-metformin 50-1000 mg) BID with meals  We will have you follow up in 3 weeks for your blood pressure and diabetes. Please bring your meter at that clinic visit.

## 2018-06-22 NOTE — Progress Notes (Signed)
Internal Medicine Clinic Attending  I saw and evaluated the patient.  I personally confirmed the key portions of the history and exam documented by Dr. Prince and I reviewed pertinent patient test results.  The assessment, diagnosis, and plan were formulated together and I agree with the documentation in the resident's note.  

## 2018-06-22 NOTE — Assessment & Plan Note (Signed)
Assessment: Her blood pressure has remained uncontrolled while on Lisinopril 40 mg QD, Verapamil 360 mg QD (actually takes 240 mg at home), and Lasix 20 mg QD. She was prescribed clonidine patches and was unable to pick them up.   Plan: 1. Increase dose of  Furosemide to 20 mg BID 2. Continue lisinopril 40 mg QD 3. Recommended taking the full Verapamil 360 mg dose (1.5 tablets everyday) 4. Follow-up in 3 weeks to recheck blood pressure

## 2018-06-22 NOTE — Progress Notes (Signed)
   CC: 2 week follow-up  HPI:  [name] is a 62 y.o. female with HTN, DM, graves disease, osteoporosis, CKD, HLD, hx of SCC of pharynx who presents for 2 week follow-up.  Hypertension: Uncontrolled today (205/83, repeat 211/86) on Lisinopril 40 mg QD, Verapamil 360 mg QD (actually takes 240 mg at home), and Lasix 20 mg QD. She was prescribed Clonidine 0.1 mg patch at last clinic visit but patient had issues with getting this at the pharmacy. Given her recent history of mild hyperkalemia, spiranoloactone was not added during this time. Aldosterone/renin ratio within normal limits. She denies any systemic symptoms (vision changes, headache, decreased urinary output, chest pain, SOB).   T2DM She states that she has been taking 20 units of lantus in the morning and at night. She also takes Novolog 8 units at breakfast, 6 at lunch, and 8 with dinner. Her average blood sugar per meter report is 310. She seems to be running high at night (200-283) and then become very low in the morning (60's-100's) she states that she did not feel well one night and checked her blood sugar which was 57 at that time. She stopped taking her Janumet 50-1000 mg tablets 3 weeks ago because she did not want to take it anymore.   Past Medical History:  Diagnosis Date  . Diabetes mellitus   . Ganglion cyst    right wrist  . GERD (gastroesophageal reflux disease)   . History of radiation therapy 02/27/10 -04/13/10   left base of tongue, bilat neck  . Hyperlipidemia   . Hypertension   . Iron deficiency anemia   . 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 Menopause   . Thyroid disease    Hyperthyroidism  . Tobacco abuse    PT HAS QUIT   Review of Systems:   Review of Systems  Constitutional: Negative for chills, fever and weight loss.  HENT: Negative for congestion, sinus pain and sore throat.   Respiratory: Negative for cough and shortness of breath.   Cardiovascular: Negative for  chest pain, palpitations and leg swelling.  Gastrointestinal: Negative for abdominal pain, diarrhea, nausea and vomiting.  Genitourinary: Negative for dysuria and hematuria.  Skin: Negative for rash.  Neurological: Negative for dizziness and headaches.  All other systems reviewed and are negative.   Physical Exam:  Vitals:   06/22/18 1353  BP: (!) 205/83  Pulse: (!) 102  Temp: 98.5 F (36.9 C)  TempSrc: Oral  SpO2: 100%  Weight: 127 lb 8 oz (57.8 kg)  Height: 5\' 2"  (1.575 m)   Physical Exam  Constitutional: She appears well-developed and well-nourished.  HENT:  Head: Normocephalic and atraumatic.  Eyes: EOM are normal.  Neck: Normal range of motion.  Cardiovascular: Regular rhythm and normal heart sounds.  Pulmonary/Chest: Effort normal and breath sounds normal.  Abdominal: Soft. Bowel sounds are normal.  Musculoskeletal: She exhibits no edema.  Neurological: She is alert.  Skin: Skin is warm and dry.  Psychiatric: She has a normal mood and affect. Her behavior is normal.  Nursing note and vitals reviewed.   Assessment & Plan:   See Encounters Tab for problem based charting.  Patient seen with Dr. Angelia Mould

## 2018-06-22 NOTE — Assessment & Plan Note (Signed)
Assessment: Susan Evans has uncontrolled diabetes while on 20 units of lantus in the morning and at night, Novolog 8 units at breakfast, 6 at lunch, and 8 with dinner. She has not taken her Janumet 50-1000 mg tablets for 3 weeks. She continues to have increased blood sugars at night (200-283) and then become very low in the morning (60's-100's) suggesting that her basal insulin is at too high of a dose. I will thus decrease her Lantus dosage to 15 units in the morning and at night while keeping the same meal-time Novolog dosage. I will also restart her Janumet.   Plan: 1. Restart Janumet 50-1000 mg BID with meals 2. Decrease Lantus to 15 units in the morning and at night  3. Continue Novolog 7 units at breakfast, 10 at lunch, and 8 with dinner. 4. Follow-up in 3 weeks to recheck meter readings on new glycemic regiment.

## 2018-06-28 ENCOUNTER — Other Ambulatory Visit: Payer: Self-pay | Admitting: Internal Medicine

## 2018-06-28 DIAGNOSIS — M810 Age-related osteoporosis without current pathological fracture: Secondary | ICD-10-CM

## 2018-07-08 ENCOUNTER — Ambulatory Visit: Payer: Medicaid Other

## 2018-07-09 ENCOUNTER — Telehealth: Payer: Self-pay | Admitting: Internal Medicine

## 2018-07-13 ENCOUNTER — Ambulatory Visit: Payer: Medicaid Other

## 2018-07-19 ENCOUNTER — Other Ambulatory Visit: Payer: Self-pay | Admitting: Internal Medicine

## 2018-07-19 DIAGNOSIS — I10 Essential (primary) hypertension: Secondary | ICD-10-CM

## 2018-07-20 NOTE — Telephone Encounter (Signed)
Pt has an appt tomorrow in [facility]  Thanks

## 2018-07-21 ENCOUNTER — Encounter (INDEPENDENT_AMBULATORY_CARE_PROVIDER_SITE_OTHER): Payer: Self-pay

## 2018-07-21 ENCOUNTER — Other Ambulatory Visit: Payer: Self-pay

## 2018-07-21 ENCOUNTER — Ambulatory Visit: Payer: Medicaid Other | Admitting: Internal Medicine

## 2018-07-21 DIAGNOSIS — E118 Type 2 diabetes mellitus with unspecified complications: Secondary | ICD-10-CM

## 2018-07-21 DIAGNOSIS — Z794 Long term (current) use of insulin: Secondary | ICD-10-CM

## 2018-07-21 DIAGNOSIS — Z7689 Persons encountering health services in other specified circumstances: Secondary | ICD-10-CM | POA: Diagnosis not present

## 2018-07-21 DIAGNOSIS — E119 Type 2 diabetes mellitus without complications: Secondary | ICD-10-CM

## 2018-07-21 NOTE — Patient Instructions (Signed)
FOLLOW-UP INSTRUCTIONS When: ~2 months For: Please stop and schedule an appointment with Dr. Reesa Chew in the next 2-3 months What to bring: All of your medications and your glucometer  I have made not made any changes to your medications today.   Today we discussed diabetes. We will continue on the current treatment. I feel that the addition of another oral medication would be beneficial to better treat your diabetes. Please discuss this with you doctor at your next visit.   As always if your symptoms worsen, fail to improve, or you develop other concerning symptoms, please notify our office or visit the local ER if we are unavailable. Symptoms including weakness, confusion, headache, sweating, should not be ignored and should encourage you to visit the ED if we are unavailable by phone or the symptoms are severe.  Thank you for your visit to the [facility] today. If you have any questions or concerns please call us at [phone].

## 2018-07-21 NOTE — Progress Notes (Signed)
   CC: diabetes  HPI:[name] is a 62 y.o. female who presents for evaluation of diabetes. Please see individual problem based A/P for details.  Diabetes: On the patients last visit she described episodes consistent with hypoglycemia. As such, her BID Lantus was decreased significantly. This appears to have been an appropriate adjustment given that the patient denied recurrent hypoglycemic events. Her CBG data reveals a single 65 reading but this was asymptomatic. Her data also demonstrates wide ranges in values taken at the same time for various days. This is consistent with her reported missing of meal time insulin doses as well as overeating large meals or consumption of small meals absent dose adjustment of her insulin. As such, we will continue her current regimen.  She denied headache, visual changes, blurry vision, nausea, weakness, sweating or confusion. The average reading for the patients glucometer average was 189, % time in target was 42, % time below target was 2.7, and % time above target was. 55.4.   Plan: Please discuss addition of an SGLT-2i to her regimen. She preferred to discuss this with her PCP. She denied a history of DKA or hospitalizations for uncrontrolled diabetes or recurrent UTI's. Continue her Lantus 15U bid Continue Janumet 50-1000 BID, She endorses taking this medication appropriately Continue her meal time novolog 7, 10, or 8 U with each meal  She agreed to consider talking with a nutritionist again.   PHQ-9: Based on the patients    Office Visit from 07/21/2018 in [place]  PHQ-9 Total Score  0     score we have decided to monitor.  Past Medical History:  Diagnosis Date  . Diabetes mellitus   . Ganglion cyst    right wrist  . GERD (gastroesophageal reflux disease)   . History of radiation therapy 02/27/10 -04/13/10   left base of tongue, bilat neck  . Hyperlipidemia   . Hypertension   . Iron deficiency anemia   .  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 Menopause   . Thyroid disease    Hyperthyroidism  . Tobacco abuse    PT HAS QUIT   Review of Systems:  ROS negative except as per HPI.  Physical Exam: Vitals:   07/21/18 0955  BP: (!) 178/70  Pulse: 98  Temp: 98.3 F (36.8 C)  TempSrc: Oral  SpO2: 100%  Weight: 125 lb 14.4 oz (57.1 kg)  Height: 5\' 2"  (1.575 m)   General: A/O x4, no acute distress, afebrile, nondiaphoretic MSK: BLE nontender, nonedematous  Assessment & Plan:   See Encounters Tab for problem based charting.  Patient discussed with Dr. Evette Doffing

## 2018-07-21 NOTE — Assessment & Plan Note (Signed)
Diabetes: On the patients last visit she described episodes consistent with hypoglycemia. As such, her BID Lantus was decreased significantly. This appears to have been an appropriate adjustment given that the patient denied recurrent hypoglycemic events. Her CBG data reveals a single 65 reading but this was asymptomatic. Her data also demonstrates wide ranges in values taken at the same time for various days. This is consistent with her reported missing of meal time insulin doses as well as overeating large meals or consumption of small meals absent dose adjustment of her insulin. As such, we will continue her current regimen.  She denied headache, visual changes, blurry vision, nausea, weakness, sweating or confusion. The average reading for the patients glucometer average was 189, % time in target was 42, % time below target was 2.7, and % time above target was. 55.4.   Plan: Please discuss addition of an SGLT-2i to her regimen. She preferred to discuss this with her PCP. She denied a history of DKA or hospitalizations for uncrontrolled diabetes or recurrent UTI's. Continue her Lantus 15U bid Continue Janumet 50-1000 BID, She endorses taking this medication appropriately Continue her meal time novolog 7, 10, or 8 U with each meal  She agreed to consider talking with a nutritionist again.

## 2018-07-22 NOTE — Progress Notes (Signed)
Internal Medicine Clinic Attending  Case discussed with Dr. Harbrecht at the time of the visit.  We reviewed the resident's history and exam and pertinent patient test results.  I agree with the assessment, diagnosis, and plan of care documented in the resident's note.   

## 2018-07-25 ENCOUNTER — Other Ambulatory Visit: Payer: Self-pay | Admitting: Internal Medicine

## 2018-07-27 DIAGNOSIS — E119 Type 2 diabetes mellitus without complications: Secondary | ICD-10-CM | POA: Diagnosis not present

## 2018-07-27 DIAGNOSIS — H2513 Age-related nuclear cataract, bilateral: Secondary | ICD-10-CM | POA: Diagnosis not present

## 2018-07-27 DIAGNOSIS — H25013 Cortical age-related cataract, bilateral: Secondary | ICD-10-CM | POA: Diagnosis not present

## 2018-07-27 DIAGNOSIS — H40013 Open angle with borderline findings, low risk, bilateral: Secondary | ICD-10-CM | POA: Diagnosis not present

## 2018-07-27 DIAGNOSIS — Z7689 Persons encountering health services in other specified circumstances: Secondary | ICD-10-CM | POA: Diagnosis not present

## 2018-07-27 LAB — HM DIABETES EYE EXAM

## 2018-07-28 ENCOUNTER — Encounter: Payer: Self-pay | Admitting: *Deleted

## 2018-08-12 ENCOUNTER — Ambulatory Visit: Payer: Medicaid Other

## 2018-08-27 ENCOUNTER — Telehealth: Payer: Self-pay | Admitting: Internal Medicine

## 2018-08-30 ENCOUNTER — Other Ambulatory Visit: Payer: Self-pay | Admitting: Internal Medicine

## 2018-08-30 DIAGNOSIS — I10 Essential (primary) hypertension: Secondary | ICD-10-CM

## 2018-09-21 ENCOUNTER — Telehealth: Payer: Self-pay | Admitting: Dietician

## 2018-09-21 DIAGNOSIS — Z794 Long term (current) use of insulin: Principal | ICD-10-CM

## 2018-09-21 DIAGNOSIS — E119 Type 2 diabetes mellitus without complications: Secondary | ICD-10-CM

## 2018-09-21 MED ORDER — ACCU-CHEK SOFTCLIX LANCETS MISC: 7 refills | Status: DC

## 2018-09-21 MED ORDER — "INSULIN SYRINGE-NEEDLE U-100 31G X 15/64"" 0.3 ML MISC": 3 refills | Status: DC

## 2018-09-21 NOTE — Telephone Encounter (Signed)
Patient called and said pharmacy told her her strips had been recalled and discontinued. I called [facility]- her strips are on order because [facility] was out of stock. Patient notified.

## 2018-09-22 ENCOUNTER — Ambulatory Visit
Admission: RE | Admit: 2018-09-22 | Discharge: 2018-09-22 | Disposition: A | Discharge status: Home / Self Care | Payer: Medicaid Other | Source: Ambulatory Visit | Attending: Internal Medicine | Admitting: Internal Medicine

## 2018-09-22 DIAGNOSIS — Z1231 Encounter for screening mammogram for malignant neoplasm of breast: Secondary | ICD-10-CM | POA: Diagnosis not present

## 2018-09-27 ENCOUNTER — Other Ambulatory Visit: Payer: Self-pay | Admitting: Internal Medicine

## 2018-09-27 DIAGNOSIS — M810 Age-related osteoporosis without current pathological fracture: Secondary | ICD-10-CM

## 2018-10-06 ENCOUNTER — Telehealth: Payer: Self-pay | Admitting: Dietician

## 2018-10-06 DIAGNOSIS — E119 Type 2 diabetes mellitus without complications: Secondary | ICD-10-CM

## 2018-10-06 DIAGNOSIS — Z794 Long term (current) use of insulin: Principal | ICD-10-CM

## 2018-10-06 MED ORDER — GLUCOSE BLOOD VI STRP: ORAL_STRIP | 12 refills | Status: DC

## 2018-10-06 MED ORDER — ACCU-CHEK FASTCLIX LANCETS MISC: 8 refills | Status: DC

## 2018-10-06 NOTE — Telephone Encounter (Signed)
Patient calls because she ended up paying for strips for some reason. She says she'd like a new updated meter. Will leave sample Accu chek Guide meter at front desk for her and request supplies

## 2018-10-08 NOTE — Telephone Encounter (Signed)
Thank you :)

## 2018-10-16 ENCOUNTER — Telehealth: Payer: Self-pay | Admitting: Dietician

## 2018-10-16 NOTE — Telephone Encounter (Signed)
Susan Evans called to see if the blood glucose meter was still waiting for her at our front office and if the test strips for it  were sent to her pharmacy. I confirmed that both her meter and strips are waiting for her to pick them up.

## 2018-10-18 ENCOUNTER — Other Ambulatory Visit: Payer: Self-pay | Admitting: Internal Medicine

## 2018-10-18 DIAGNOSIS — I10 Essential (primary) hypertension: Secondary | ICD-10-CM

## 2018-10-18 DIAGNOSIS — E785 Hyperlipidemia, unspecified: Secondary | ICD-10-CM

## 2018-10-21 ENCOUNTER — Ambulatory Visit (INDEPENDENT_AMBULATORY_CARE_PROVIDER_SITE_OTHER): Payer: Medicaid Other | Admitting: *Deleted

## 2018-10-21 DIAGNOSIS — Z23 Encounter for immunization: Secondary | ICD-10-CM

## 2018-10-22 ENCOUNTER — Ambulatory Visit: Payer: Medicaid Other

## 2018-11-14 ENCOUNTER — Other Ambulatory Visit: Payer: Self-pay | Admitting: Internal Medicine

## 2018-11-14 DIAGNOSIS — I10 Essential (primary) hypertension: Secondary | ICD-10-CM

## 2018-11-17 ENCOUNTER — Encounter: Payer: Self-pay | Admitting: Internal Medicine

## 2018-11-17 ENCOUNTER — Ambulatory Visit: Payer: Medicaid Other | Admitting: Internal Medicine

## 2018-11-17 ENCOUNTER — Other Ambulatory Visit: Payer: Self-pay

## 2018-11-17 VITALS — BP 169/82 | HR 100 | Temp 98.3°F | Ht 62.0 in | Wt 121.6 lb

## 2018-11-17 DIAGNOSIS — Z0000 Encounter for general adult medical examination without abnormal findings: Secondary | ICD-10-CM

## 2018-11-17 DIAGNOSIS — Z794 Long term (current) use of insulin: Secondary | ICD-10-CM

## 2018-11-17 DIAGNOSIS — Z7689 Persons encountering health services in other specified circumstances: Secondary | ICD-10-CM | POA: Diagnosis not present

## 2018-11-17 DIAGNOSIS — E785 Hyperlipidemia, unspecified: Secondary | ICD-10-CM | POA: Diagnosis not present

## 2018-11-17 DIAGNOSIS — E119 Type 2 diabetes mellitus without complications: Secondary | ICD-10-CM | POA: Diagnosis not present

## 2018-11-17 DIAGNOSIS — Z79899 Other long term (current) drug therapy: Secondary | ICD-10-CM

## 2018-11-17 DIAGNOSIS — E1122 Type 2 diabetes mellitus with diabetic chronic kidney disease: Secondary | ICD-10-CM | POA: Diagnosis not present

## 2018-11-17 DIAGNOSIS — E05 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: Secondary | ICD-10-CM

## 2018-11-17 DIAGNOSIS — N181 Chronic kidney disease, stage 1: Secondary | ICD-10-CM | POA: Diagnosis not present

## 2018-11-17 DIAGNOSIS — N183 Chronic kidney disease, stage 3 (moderate): Secondary | ICD-10-CM

## 2018-11-17 DIAGNOSIS — I10 Essential (primary) hypertension: Secondary | ICD-10-CM

## 2018-11-17 DIAGNOSIS — I129 Hypertensive chronic kidney disease with stage 1 through stage 4 chronic kidney disease, or unspecified chronic kidney disease: Secondary | ICD-10-CM

## 2018-11-17 LAB — POCT GLYCOSYLATED HEMOGLOBIN (HGB A1C): Hemoglobin A1C: 7.6 % — AB (ref 4.0–5.6)

## 2018-11-17 LAB — GLUCOSE, CAPILLARY
GLUCOSE-CAPILLARY: 70 mg/dL (ref 70–99)
Glucose-Capillary: 117 mg/dL — ABNORMAL HIGH (ref 70–99)

## 2018-11-17 MED ORDER — CLONIDINE 0.1 MG/24HR TD PTWK: 0.1000 mg | MEDICATED_PATCH | TRANSDERMAL | 11 refills | Status: DC

## 2018-11-17 NOTE — Assessment & Plan Note (Addendum)
Patient denies any palpitations, heat or cold intolerance. She was little tachycardic and continue to lose weight. Her weight was 121 pound today, decreased from 127 pound in September 2019. She did not followed up with her endocrinologist for more than a year, last TSH checked was in May 2019 and it was 1.62. She is off the methimazole now.  -Check TSH and free T4.  Addendum.  TSH and free T4 are within normal limit.

## 2018-11-17 NOTE — Assessment & Plan Note (Signed)
BP Readings from Last 3 Encounters:  11/17/18 (!) 169/82  07/21/18 (!) 178/70  06/22/18 (!) 215/86   On initial presentation her blood pressure was 190/82, which improved to 169/82 on recheck, remained elevated. She was given clonidine 0.1 mg per 24-hour patch by her nephrologist few months ago, apparently never picked up her prescription. She denies any dizziness, headache or blurry vision. Denies any dyspnea or chest pain. Denies any lower extremity edema.  -New prescription of clonidine patch 0.1 mg per 24-hour was sent to her pharmacy and advised her to pick up her prescription and start using it. -Continue lisinopril 40 mg daily, verapamil 360 mg daily and Lasix 20 mg twice daily. -Follow-up in 2 to 3 weeks for blood pressure check.

## 2018-11-17 NOTE — Patient Instructions (Signed)
Thank you for visiting clinic today. As we discussed your blood pressure is high, I do sent a new prescription of patch which was originally given to you by your kidney doctor few months ago, please pick up your prescription from pharmacy and start using it. Your A1c is improving, we will continue current management for your diabetes, please bring your glucometer with your next appointment. Please eat your meal after taking a short acting insulin as that can result in low blood sugar. Let me know when you are ready for colonoscopy. We were checking your thyroid level, cholesterol and kidney function today and will call you with any abnormal results. Please follow-up in 2 to 3 weeks for your blood pressure check.

## 2018-11-17 NOTE — Assessment & Plan Note (Addendum)
She followed up with nephrology, her next appointment is on March 3.  We will check her BMP today, as she has an history of  hyperkalemia in the past.  Addendum.  She is having worsening creatinine, with decrease in GFR to 34.  Electrolytes are within normal limit. She has an appointment with her nephrologist on March 3. Called the patient and advised again her to pick up her clonidine patches which she has not done yet, as keeping blood pressure under control is vital for her kidney functions.

## 2018-11-17 NOTE — Assessment & Plan Note (Addendum)
She is due for lipid profile which will be checked today. Continue Lipitor 40 mg daily.  Addendum.  According to her new lipid profile her ASCVD risk is 27.5%, patient is already on high intensity statin. Called the patient and advised her to continue taking her Lipitor. Her blood pressure remained high which needs a better control to decrease her ASCVD risk.

## 2018-11-17 NOTE — Assessment & Plan Note (Signed)
She is due for colonoscopy, she will let us know when she feels ready for that.

## 2018-11-17 NOTE — Progress Notes (Addendum)
   CC: For follow-up of her diabetes and hypertension.  HPI:  Susan Evans is a 63 y.o. with past medical history as listed below came to the clinic for follow-up of her diabetes and hypertension.  She has no complaints today.  Please see assessment and plan for her chronic conditions.  Past Medical History:  Diagnosis Date  . Diabetes mellitus   . Ganglion cyst    right wrist  . GERD (gastroesophageal reflux disease)   . History of radiation therapy 02/27/10 -04/13/10   left base of tongue, bilat neck  . Hyperlipidemia   . Hypertension   . Iron deficiency anemia   . 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 Menopause   . Thyroid disease    Hyperthyroidism  . Tobacco abuse    PT HAS QUIT   Review of Systems: Negative except mentioned in HPI. Physical Exam:  Vitals:   11/17/18 1357 11/17/18 1406  BP: (!) 190/82 (!) 169/82  Pulse: (!) 110 100  Temp: 98.3 F (36.8 C)   TempSrc: Oral   SpO2: 100%   Weight: 121 lb 9.6 oz (55.2 kg)   Height: 5\' 2"  (1.575 m)     General: Vital signs reviewed.  Patient is well-developed and well-nourished, in no acute distress and cooperative with exam.  Head: Normocephalic and atraumatic. Eyes: EOMI, conjunctivae normal, no scleral icterus.  Neck: Supple, trachea midline, normal ROM, no JVD, masses, thyromegaly, or carotid bruit present.  Cardiovascular: RRR, S1 normal, S2 normal, no murmurs, gallops, or rubs. Pulmonary/Chest: Clear to auscultation bilaterally, no wheezes, rales, or rhonchi. Abdominal: Soft, non-tender, non-distended, BS +, no masses, organomegaly, or guarding present.  Extremities: No lower extremity edema bilaterally,  pulses symmetric and intact bilaterally. No cyanosis or clubbing. Skin: Warm, dry and intact. No rashes or erythema. Psychiatric: Normal mood and affect. speech and behavior is normal. Cognition and memory are normal.  Assessment & Plan:   See Encounters Tab for problem based  charting.  Patient discussed with Dr. Evette Doffing.

## 2018-11-17 NOTE — Assessment & Plan Note (Signed)
She forgot to bring her glucometer.  She denies any hypoglycemic events, does not remember upper and lower numbers.  During clinic visit her blood sugar was 70 without any symptoms of hypoglycemia, which was improved to 117 with snacks and orange juice.  Apparently patient took her afternoon dose of NovoLog 5 units and forget to eat her lunch as she was rushing for her clinic visit.  Her A1c improved to 7.6 from 8.2.  -We will continue current management with Lantus 15 units twice daily and NovoLog 7- 4-5-8 along with Janumet 50-1000. -He was advised to bring her glucometer during next follow-up visit.

## 2018-11-18 LAB — BMP8+ANION GAP
Anion Gap: 19 mmol/L — ABNORMAL HIGH (ref 10.0–18.0)
BUN/Creatinine Ratio: 9 — ABNORMAL LOW (ref 12–28)
BUN: 17 mg/dL (ref 8–27)
CO2: 23 mmol/L (ref 20–29)
Calcium: 10.1 mg/dL (ref 8.7–10.3)
Chloride: 99 mmol/L (ref 96–106)
Creatinine, Ser: 1.81 mg/dL — ABNORMAL HIGH (ref 0.57–1.00)
GFR calc Af Amer: 34 mL/min/{1.73_m2} — ABNORMAL LOW (ref >59)
GFR calc non Af Amer: 30 mL/min/{1.73_m2} — ABNORMAL LOW (ref >59)
Glucose: 116 mg/dL — ABNORMAL HIGH (ref 65–99)
Potassium: 3.6 mmol/L (ref 3.5–5.2)
Sodium: 141 mmol/L (ref 134–144)

## 2018-11-18 LAB — LIPID PANEL
Chol/HDL Ratio: 3.9 ratio (ref 0.0–4.4)
Cholesterol, Total: 164 mg/dL (ref 100–199)
HDL: 42 mg/dL (ref >39)
LDL Calculated: 77 mg/dL (ref 0–99)
Triglycerides: 227 mg/dL — ABNORMAL HIGH (ref 0–149)
VLDL CHOLESTEROL CAL: 45 mg/dL — AB (ref 5–40)

## 2018-11-18 LAB — TSH: TSH: 4.05 u[IU]/mL (ref 0.450–4.500)

## 2018-11-18 LAB — T4, FREE: Free T4: 1.07 ng/dL (ref 0.82–1.77)

## 2018-11-18 NOTE — Progress Notes (Signed)
Internal Medicine Clinic Attending  Case discussed with Dr. Amin at the time of the visit.  We reviewed the resident's history and exam and pertinent patient test results.  I agree with the assessment, diagnosis, and plan of care documented in the resident's note.    

## 2018-11-24 DIAGNOSIS — N182 Chronic kidney disease, stage 2 (mild): Secondary | ICD-10-CM | POA: Diagnosis not present

## 2018-11-24 DIAGNOSIS — C140 Malignant neoplasm of pharynx, unspecified: Secondary | ICD-10-CM | POA: Diagnosis not present

## 2018-11-24 DIAGNOSIS — I129 Hypertensive chronic kidney disease with stage 1 through stage 4 chronic kidney disease, or unspecified chronic kidney disease: Secondary | ICD-10-CM | POA: Diagnosis not present

## 2018-11-24 DIAGNOSIS — E1129 Type 2 diabetes mellitus with other diabetic kidney complication: Secondary | ICD-10-CM | POA: Diagnosis not present

## 2018-11-24 DIAGNOSIS — R809 Proteinuria, unspecified: Secondary | ICD-10-CM | POA: Diagnosis not present

## 2018-11-27 ENCOUNTER — Other Ambulatory Visit: Payer: Self-pay | Admitting: Internal Medicine

## 2018-11-27 DIAGNOSIS — I10 Essential (primary) hypertension: Secondary | ICD-10-CM

## 2018-11-27 NOTE — Telephone Encounter (Signed)
Pt has an [place] appt on 3/17.

## 2018-12-08 ENCOUNTER — Ambulatory Visit: Payer: Medicaid Other

## 2018-12-15 ENCOUNTER — Encounter: Payer: Self-pay | Admitting: Internal Medicine

## 2018-12-15 ENCOUNTER — Other Ambulatory Visit: Payer: Self-pay

## 2018-12-15 ENCOUNTER — Ambulatory Visit: Payer: Medicaid Other | Admitting: Internal Medicine

## 2018-12-15 VITALS — BP 174/81 | HR 90 | Temp 97.7°F | Ht 62.0 in | Wt 122.5 lb

## 2018-12-15 DIAGNOSIS — I10 Essential (primary) hypertension: Secondary | ICD-10-CM | POA: Diagnosis not present

## 2018-12-15 DIAGNOSIS — Z79899 Other long term (current) drug therapy: Secondary | ICD-10-CM

## 2018-12-15 DIAGNOSIS — Z7689 Persons encountering health services in other specified circumstances: Secondary | ICD-10-CM | POA: Diagnosis not present

## 2018-12-15 DIAGNOSIS — E119 Type 2 diabetes mellitus without complications: Secondary | ICD-10-CM

## 2018-12-15 DIAGNOSIS — Z794 Long term (current) use of insulin: Secondary | ICD-10-CM | POA: Diagnosis not present

## 2018-12-15 MED ORDER — CLONIDINE 0.2 MG/24HR TD PTWK: 0.2000 mg | MEDICATED_PATCH | TRANSDERMAL | 11 refills | Status: DC

## 2018-12-15 NOTE — Patient Instructions (Signed)
Thank you for allowing [place] to provide your care today. Today we discussed your hypertension and Type II Diabetes   I have ordered the following labs for you:  Basic metabolic panel   I will call if any are abnormal.    Today we made the following changes to your medications:   Please START taking  Clonidine .2MG /24hr patch - use patch for 7 days, then switch to new patch.   Do not discontinue clonidine immediately as it will have an effect of increasing your blood pressure beyond normal.   Please STOP taking   Clonidine .1MG /24 hr. patch  Please follow-up in one month via telephone visit.    Should you have any questions or concerns please call the internal medicine clinic at [phone].

## 2018-12-15 NOTE — Assessment & Plan Note (Signed)
BP Readings from Last 3 Encounters:  12/15/18 (!) 166/74  11/17/18 (!) 169/82  07/21/18 (!) 178/70  She has been taking her verapamil 360 mg qd, lasix 20 mg bid, lisinopril 40 mg, and clonidine .1mg /24 hr. Patch. She had her appointment with [facility] on March 3rd and as her bp was still high was told to record this at home and call back in two weeks. She called yesterday but they did not answer and will call again after today's appointment. States highest at home was 160/89, lowest was 130/84. Blood pressure continues to remain high.   - inc. clinidine from .1MG  to .2MG   - cont. Current BP medications - f/u with [facility] [facility] today  - f/u one month telephone visit

## 2018-12-15 NOTE — Progress Notes (Signed)
Internal Medicine Clinic Attending  Case discussed with Dr. Seawell at the time of the visit.  We reviewed the resident's history and exam and pertinent patient test results.  I agree with the assessment, diagnosis, and plan of care documented in the resident's note.    

## 2018-12-15 NOTE — Assessment & Plan Note (Signed)
She has been keeping track of her glucose at home and states it runs between 85 and 200. She denies symptoms of hypoglycemia. She is currently taking novolog 7,10,8 U with br,lu,dinner, lantus 15U bid, and Janumet 50-1000 MG tablet bid qc.   - cont. Current medications

## 2018-12-15 NOTE — Progress Notes (Signed)
   CC: blood pressure check  HPI:  Susan Evans is a 63 y.o. with PMH as below presenting for blood pressure check  Please see A&P for assessment of the patient's acute and chronic medical conditions.  Past Medical History:  Diagnosis Date  . Diabetes mellitus   . Ganglion cyst    right wrist  . GERD (gastroesophageal reflux disease)   . History of radiation therapy 02/27/10 -04/13/10   left base of tongue, bilat neck  . Hyperlipidemia   . Hypertension   . Iron deficiency anemia   . 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 Menopause   . Thyroid disease    Hyperthyroidism  . Tobacco abuse    PT HAS QUIT   Review of Systems:   Review of Systems  Respiratory: Negative for cough and shortness of breath.   Cardiovascular: Negative for chest pain and leg swelling.  Gastrointestinal: Negative for abdominal pain and nausea.  Genitourinary: Negative for dysuria, frequency and urgency.  Neurological: Negative for dizziness and weakness.  Endo/Heme/Allergies: Negative for polydipsia.   Physical Exam:  Constitution: NAD, well-dressed/nourished Cardio: RRR, no murmurs rubs gallops Respiratory: CTA, no w/r/r  MSK: No LE edema, extremities warm  Neuro: a&o, normal affect   Vitals:   12/15/18 1027 12/15/18 1048  BP: (!) 166/74 (!) 174/81  Pulse: 93 90  Temp: 97.7 F (36.5 C)   TempSrc: Oral   SpO2: 100%   Weight: 122 lb 8 oz (55.6 kg)   Height: 5\' 2"  (1.575 m)      Assessment & Plan:   See Encounters Tab for problem based charting.  Patient discussed with Dr. Angelia Mould

## 2018-12-16 LAB — BMP8+ANION GAP
Anion Gap: 18 mmol/L (ref 10.0–18.0)
BUN/Creatinine Ratio: 11 — ABNORMAL LOW (ref 12–28)
BUN: 20 mg/dL (ref 8–27)
CO2: 24 mmol/L (ref 20–29)
Calcium: 9.5 mg/dL (ref 8.7–10.3)
Chloride: 96 mmol/L (ref 96–106)
Creatinine, Ser: 1.89 mg/dL — ABNORMAL HIGH (ref 0.57–1.00)
GFR calc Af Amer: 32 mL/min/{1.73_m2} — ABNORMAL LOW (ref >59)
GFR calc non Af Amer: 28 mL/min/{1.73_m2} — ABNORMAL LOW (ref >59)
Glucose: 218 mg/dL — ABNORMAL HIGH (ref 65–99)
Potassium: 3.8 mmol/L (ref 3.5–5.2)
SODIUM: 138 mmol/L (ref 134–144)

## 2018-12-17 ENCOUNTER — Other Ambulatory Visit: Payer: Self-pay | Admitting: Internal Medicine

## 2018-12-17 DIAGNOSIS — E119 Type 2 diabetes mellitus without complications: Secondary | ICD-10-CM

## 2018-12-18 ENCOUNTER — Telehealth: Payer: Self-pay | Admitting: Dietician

## 2018-12-18 ENCOUNTER — Telehealth: Payer: Self-pay | Admitting: *Deleted

## 2018-12-18 NOTE — Telephone Encounter (Signed)
Call to [place] for PA for Clonidine 0.2mg /24 patches and Janumet 50-1000 mg tablets.  Information was given.  Janumet 50-1000 mg tablets were approved 12/18/2018 thru 12/13/2019. [place] [id number]. I- [id number].  Clonidine 0.2 mg/24 patches if changed to Catapres Patches will require no PA.  Message sent to Dr. Reesa Chew to Consider a change in medication.  Sander Nephew, RN 12/18/2018 1:33PM.

## 2018-12-18 NOTE — Telephone Encounter (Signed)
ASHAUNA BERTHOLF is a 63 y.o. female who was contacted on behalf of [facility] Geriatrics Task Force.   Diabetes Assessment  DM meds and BS checks -  "What medications are you taking for diabetes?" insulin  Novolog 7 for breakfast-4-5-7 for lunch and 8 with dinner , lantus-15 units twice a day, janumet -  "How often do you check your blood sugars at home?" 3-4 times a day - "What have your blood sugars been?" 143 this am, 97, 164  High Blood Pressure Assessment  BP meds & BP checks -  "What medications are you taking for high blood pressure?" patch, furosemide, needed help-lisinopril, verapamil  - "How often do you check your blood pressure at home?" once a day - "What have your blood pressure readings been?" a little high yesterday, communicated with doctor  Coping with DM and BP - What else are you doing to help with your DM and BP - -  Diet? Oatmeal, fruit, decreased portions, [place] wings or necks, glucerna bars eats 10 am, 11-2 pm, 6 pm, small snack 10 pm -  Exercise? Was walking   Medication Access Issues  Medication Issues? -  "Are you getting your medicines refilled on time without skipping any doses?" not skipping - "Are you having any problems getting/taking your meds (cost, timing, transportation)?" yes- transportation can be a problem but daughter takes - Do you need any meds refilled? no  Conclusion  Close the call - Date of follow-up visit scheduled April 24th expecting phone  - "Any other questions or concerns?" no  Wants to try GLP-1 ([employer] on formulary)  if she can.

## 2018-12-21 NOTE — Telephone Encounter (Signed)
The same thing she can use Catapres patches.

## 2018-12-25 ENCOUNTER — Other Ambulatory Visit: Payer: Self-pay | Admitting: Internal Medicine

## 2018-12-25 DIAGNOSIS — M810 Age-related osteoporosis without current pathological fracture: Secondary | ICD-10-CM

## 2019-01-11 ENCOUNTER — Encounter: Payer: Medicaid Other | Admitting: Internal Medicine

## 2019-01-17 ENCOUNTER — Other Ambulatory Visit: Payer: Self-pay | Admitting: Internal Medicine

## 2019-01-17 DIAGNOSIS — E785 Hyperlipidemia, unspecified: Secondary | ICD-10-CM

## 2019-01-17 DIAGNOSIS — I10 Essential (primary) hypertension: Secondary | ICD-10-CM

## 2019-01-18 ENCOUNTER — Other Ambulatory Visit: Payer: Self-pay

## 2019-01-18 ENCOUNTER — Ambulatory Visit (INDEPENDENT_AMBULATORY_CARE_PROVIDER_SITE_OTHER): Payer: Medicaid Other | Admitting: Internal Medicine

## 2019-01-18 DIAGNOSIS — E119 Type 2 diabetes mellitus without complications: Secondary | ICD-10-CM

## 2019-01-18 DIAGNOSIS — E118 Type 2 diabetes mellitus with unspecified complications: Secondary | ICD-10-CM

## 2019-01-18 DIAGNOSIS — J309 Allergic rhinitis, unspecified: Secondary | ICD-10-CM | POA: Diagnosis not present

## 2019-01-18 DIAGNOSIS — I10 Essential (primary) hypertension: Secondary | ICD-10-CM

## 2019-01-19 ENCOUNTER — Telehealth: Payer: Self-pay

## 2019-01-19 NOTE — Telephone Encounter (Signed)
Requesting to speak with Dr. Reesa Chew, please call pt back.

## 2019-01-20 MED ORDER — VERAPAMIL HCL ER 240 MG PO TBCR: 360.0000 mg | EXTENDED_RELEASE_TABLET | Freq: Every day | ORAL | 2 refills | Status: DC

## 2019-01-20 MED ORDER — FUROSEMIDE 20 MG PO TABS: 20.0000 mg | ORAL_TABLET | Freq: Two times a day (BID) | ORAL | 2 refills | Status: DC

## 2019-01-20 MED ORDER — LORATADINE 10 MG PO TABS: 10.0000 mg | ORAL_TABLET | Freq: Every day | ORAL | 2 refills | Status: DC

## 2019-01-20 MED ORDER — OMEPRAZOLE 20 MG PO CPDR: 20.0000 mg | DELAYED_RELEASE_CAPSULE | Freq: Every day | ORAL | 1 refills | Status: DC

## 2019-01-20 MED ORDER — CLONIDINE 0.2 MG/24HR TD PTWK: 0.2000 mg | MEDICATED_PATCH | TRANSDERMAL | 3 refills | Status: AC

## 2019-01-20 MED ORDER — INSULIN ASPART 100 UNIT/ML FLEXPEN: PEN_INJECTOR | SUBCUTANEOUS | 6 refills | Status: DC

## 2019-01-20 MED ORDER — SITAGLIPTIN PHOS-METFORMIN HCL 50-1000 MG PO TABS: 1.0000 | ORAL_TABLET | Freq: Two times a day (BID) | ORAL | 2 refills | Status: DC

## 2019-01-20 MED ORDER — INSULIN GLARGINE 100 UNIT/ML ~~LOC~~ SOLN: SUBCUTANEOUS | 4 refills | Status: DC

## 2019-01-20 NOTE — Progress Notes (Signed)
   This is a telephone encounter between Susan Evans and Susan Evans on 01/20/2019 for CC. The visit was conducted with the patient located at home and Susan Evans at [facility] The patient's identity was confirmed using their DOB and current address. The patient has consented to being evaluated through a telephone encounter and understands the associated risks (an examination cannot be done and the patient may need to come in for an appointment) / benefits (allows the patient to remain at home, decreasing exposure to coronavirus). I personally spent 10 minutes on medical discussion.    CC: For follow-up of her diabetes and hypertension.  HPI: [name] is a 63 y.o. past medical history as listed below was given a call to follow-up of her chronic conditions.  She was doing well and has no new complaints.  Please see assessment and plan for her chronic conditions.  Past Medical History:  Diagnosis Date  . Diabetes mellitus   . Ganglion cyst    right wrist  . GERD (gastroesophageal reflux disease)   . History of radiation therapy 02/27/10 -04/13/10   left base of tongue, bilat neck  . Hyperlipidemia   . Hypertension   . Iron deficiency anemia   . 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 Menopause   . Thyroid disease    Hyperthyroidism  . Tobacco abuse    PT HAS QUIT   Review of Systems: Negative except mentioned in HPI.  Physical Exam:  There were no vitals filed for this visit. Physical exam was not done as it was a tele-visit.  Assessment & Plan:   See Encounters Tab for problem based charting.  Patient discussed with Dr. Dareen Evans.

## 2019-01-20 NOTE — Assessment & Plan Note (Signed)
Per patient her blood sugar remained under good control, it is normally between 100-200.  This morning before breakfast it was 136 and after lunch it was 160. She is compliant with her Janumet. She is using Lantus 15 units twice daily. NovoLog 7-10-8.  -Continue with current management. -Advised her to bring glucometer with her during next follow-up appointment in clinic.

## 2019-01-20 NOTE — Assessment & Plan Note (Signed)
Her symptoms are well controlled on Claritin and she takes it daily.  Advised to continue taking Claritin.

## 2019-01-20 NOTE — Assessment & Plan Note (Signed)
She is compliant with her medication.  Her clonidine dose was increased during previous office visit but she states that her pharmacy sent her with the same old 0.1 mg per 24-hour patches.  She do periodically check her blood pressure, last check was 2 days ago and it was 160/85. She denies any headache, chest pain, palpitation or shortness of breath.  -New prescription of clonidine 0.2 mg per 24-hour patch was resent to her pharmacy. -Refills were provided for lisinopril, Lasix and verapamil.

## 2019-01-21 NOTE — Progress Notes (Signed)
Internal Medicine Clinic Attending  Case discussed with Dr. Amin at the time of the visit.  We reviewed the resident's history and exam and pertinent patient test results.  I agree with the assessment, diagnosis, and plan of care documented in the resident's note.    

## 2019-01-22 NOTE — Telephone Encounter (Signed)
Dr. Reesa Chew please return call to this patient. Thank you

## 2019-01-22 NOTE — Telephone Encounter (Signed)
I did call her on Wednesday.

## 2019-01-27 ENCOUNTER — Telehealth: Payer: Self-pay | Admitting: *Deleted

## 2019-01-27 NOTE — Telephone Encounter (Signed)
Call to [facility] about PA for Clonidine 0.2 mg patches.  Patient's insurance Medicaid will not cover Clonidine  But will cover the Catapres.  Pharmacy ran as Catapres 0.2 mg patches.  Will get out to patient on tomorrow.  Sander Nephew, RN 01/27/2019 11:07 AM.

## 2019-01-28 ENCOUNTER — Telehealth: Payer: Self-pay

## 2019-01-28 DIAGNOSIS — I10 Essential (primary) hypertension: Secondary | ICD-10-CM

## 2019-01-28 NOTE — Telephone Encounter (Signed)
Susan Evans is a 63 y.o. female who was contacted on behalf of [facility].   Diabetes Assessment  DM meds and BS checks -  "What medications are you taking for diabetes?" novolog 7-10-8, janumet BID, lantus 15 BID -  "How often do you check your blood sugars at home?" in the morning and sometimes at night - "What have your blood sugars been?" 135-140s (highest 195) No symptoms of hyperglycemia  High Blood Pressure Assessment  BP meds & BP checks -  "What medications are you taking for high blood pressure?" waiting on new clonidine patch; uses 0.1mg  clonidine, furosemide, verapamil; patient could not find lisinopril (was filled 01/22/19) Patient is going to [facility] tonight w/ sister to get clonidine - "How often do you check your blood pressure at home?" needs a new blood pressure monitor- will go to [facility] tonight (sister will drive her) - "What have your blood pressure readings been?" N/A  Coping with DM and BP - What else are you doing to help with your DM and BP - Diet? Oatmeal/banana/egg for Breakfast, Sandwich/salad for lunch, fish/green beans for dinner; Exercise? Walks every morning  Medication Access Issues  Medication Issues? -  "Are you getting your medicines refilled on time without skipping any doses?" not besides clonidine and lisinopril - "Are you having any problems getting/taking your meds (cost, timing, transportation)?" transportation!- likes to pick up her medications all at the same time - Do you need any meds refilled? Just lisinopril  Conclusion  Close the call - Date of follow-up visit scheduled Will call back in 1-2 weeks; Follow-up  - "Any other questions or concerns?" No    Can consider GLP-1 or SGLT-2 in the future.

## 2019-02-17 ENCOUNTER — Telehealth: Payer: Self-pay

## 2019-02-17 DIAGNOSIS — E05 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: Secondary | ICD-10-CM | POA: Diagnosis not present

## 2019-02-17 NOTE — Telephone Encounter (Signed)
Called patient to check to see if she picked up her lisinopril and clonidine. Patient states when she got to the pharmacy, they only gave her pen needles. Will schedule appointment with patient for next week and have her bring in all of her medications to make sure everything is there. Also will consider adding GLP1 at this appointment.

## 2019-02-23 ENCOUNTER — Telehealth: Payer: Self-pay

## 2019-02-25 ENCOUNTER — Ambulatory Visit: Payer: Medicaid Other | Admitting: Pharmacist

## 2019-03-04 ENCOUNTER — Ambulatory Visit: Payer: Medicaid Other | Admitting: Pharmacist

## 2019-03-05 ENCOUNTER — Telehealth: Payer: Self-pay

## 2019-03-05 NOTE — Telephone Encounter (Signed)
Signing off of case due to challenges contacting patient and appointment no-show

## 2019-03-21 ENCOUNTER — Encounter: Payer: Self-pay | Admitting: *Deleted

## 2019-03-22 ENCOUNTER — Other Ambulatory Visit: Payer: Self-pay | Admitting: Internal Medicine

## 2019-03-22 DIAGNOSIS — M810 Age-related osteoporosis without current pathological fracture: Secondary | ICD-10-CM

## 2019-03-22 NOTE — Telephone Encounter (Signed)
May need to reassess need for vitamin D at next visit.

## 2019-04-12 DIAGNOSIS — H40013 Open angle with borderline findings, low risk, bilateral: Secondary | ICD-10-CM | POA: Diagnosis not present

## 2019-04-12 DIAGNOSIS — Z7689 Persons encountering health services in other specified circumstances: Secondary | ICD-10-CM | POA: Diagnosis not present

## 2019-04-27 ENCOUNTER — Other Ambulatory Visit: Payer: Self-pay | Admitting: Internal Medicine

## 2019-04-27 DIAGNOSIS — E785 Hyperlipidemia, unspecified: Secondary | ICD-10-CM

## 2019-04-29 ENCOUNTER — Other Ambulatory Visit: Payer: Self-pay | Admitting: Internal Medicine

## 2019-04-29 DIAGNOSIS — I10 Essential (primary) hypertension: Secondary | ICD-10-CM

## 2019-04-29 NOTE — Telephone Encounter (Signed)
Next appt scheduled 8/31 with PCP. 

## 2019-05-04 ENCOUNTER — Other Ambulatory Visit: Payer: Self-pay | Admitting: Internal Medicine

## 2019-05-04 DIAGNOSIS — E785 Hyperlipidemia, unspecified: Secondary | ICD-10-CM

## 2019-05-24 ENCOUNTER — Encounter: Payer: Medicaid Other | Admitting: Internal Medicine

## 2019-05-26 DIAGNOSIS — N182 Chronic kidney disease, stage 2 (mild): Secondary | ICD-10-CM | POA: Diagnosis not present

## 2019-05-26 DIAGNOSIS — E611 Iron deficiency: Secondary | ICD-10-CM | POA: Diagnosis not present

## 2019-05-26 DIAGNOSIS — E05 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: Secondary | ICD-10-CM | POA: Diagnosis not present

## 2019-05-26 DIAGNOSIS — I129 Hypertensive chronic kidney disease with stage 1 through stage 4 chronic kidney disease, or unspecified chronic kidney disease: Secondary | ICD-10-CM | POA: Diagnosis not present

## 2019-05-26 DIAGNOSIS — E1129 Type 2 diabetes mellitus with other diabetic kidney complication: Secondary | ICD-10-CM | POA: Diagnosis not present

## 2019-05-26 DIAGNOSIS — N183 Chronic kidney disease, stage 3 (moderate): Secondary | ICD-10-CM | POA: Diagnosis not present

## 2019-06-28 ENCOUNTER — Encounter: Payer: Self-pay | Admitting: Internal Medicine

## 2019-06-28 ENCOUNTER — Encounter (INDEPENDENT_AMBULATORY_CARE_PROVIDER_SITE_OTHER): Payer: Self-pay

## 2019-06-28 ENCOUNTER — Ambulatory Visit: Payer: Medicaid Other | Admitting: Internal Medicine

## 2019-06-28 ENCOUNTER — Other Ambulatory Visit: Payer: Self-pay

## 2019-06-28 VITALS — BP 157/75 | HR 93 | Temp 98.4°F | Ht 62.0 in | Wt 122.8 lb

## 2019-06-28 DIAGNOSIS — I10 Essential (primary) hypertension: Secondary | ICD-10-CM

## 2019-06-28 DIAGNOSIS — Z794 Long term (current) use of insulin: Secondary | ICD-10-CM | POA: Diagnosis not present

## 2019-06-28 DIAGNOSIS — E0590 Thyrotoxicosis, unspecified without thyrotoxic crisis or storm: Secondary | ICD-10-CM

## 2019-06-28 DIAGNOSIS — E118 Type 2 diabetes mellitus with unspecified complications: Secondary | ICD-10-CM

## 2019-06-28 DIAGNOSIS — E119 Type 2 diabetes mellitus without complications: Secondary | ICD-10-CM

## 2019-06-28 DIAGNOSIS — Z7689 Persons encountering health services in other specified circumstances: Secondary | ICD-10-CM | POA: Diagnosis not present

## 2019-06-28 DIAGNOSIS — Z23 Encounter for immunization: Secondary | ICD-10-CM

## 2019-06-28 DIAGNOSIS — Z79899 Other long term (current) drug therapy: Secondary | ICD-10-CM

## 2019-06-28 DIAGNOSIS — Z720 Tobacco use: Secondary | ICD-10-CM | POA: Diagnosis not present

## 2019-06-28 DIAGNOSIS — Z0000 Encounter for general adult medical examination without abnormal findings: Secondary | ICD-10-CM

## 2019-06-28 LAB — GLUCOSE, CAPILLARY: Glucose-Capillary: 185 mg/dL — ABNORMAL HIGH (ref 70–99)

## 2019-06-28 LAB — POCT GLYCOSYLATED HEMOGLOBIN (HGB A1C): Hemoglobin A1C: 7 % — AB (ref 4.0–5.6)

## 2019-06-28 NOTE — Assessment & Plan Note (Addendum)
Patient is due for colonoscopy (last 2007 and unremarkable), Pap smear, and mammogram.  The patient wants to think about getting these things done.  We will discuss it at her next visit. -Given flu shot today

## 2019-06-28 NOTE — Assessment & Plan Note (Signed)
Patient says her blood sugars are normally between 100-200.  Lowest she is seen was 2 months ago at 41.  Highest was 230.  Patient denies having increased urination or thirst or changes in her bladder habits.  A1c at 7.0 today. - Continue Janumet 50-1000 mg - Continue Lantus 15 units AM & PM - Continue NovoLog 7 units with breakfast, 10 units with lunch, 8 units with dinner - Recheck A1c in 3 months

## 2019-06-28 NOTE — Progress Notes (Signed)
CC: T2DM and HTN  follow up  HPI:  [name] is a 63 y.o. with a history as noted below who presents today for HTN and DM follow up. Pt says her sugars have been "up and down". Says the highest she has seen her sugars is 230, the lowest is 60. Has been asymptomatic.   Says she saw nephrologist on September 2nd and was given new medications, including Lisinopril and verapamil. Denies changes in vision or headaches recently. Usually BP is in 120s. She says she checks it every day at home.  Says she does not need any refills on any meds.   Smokes 1/2 a pack every 2-3 days. Wants to cut back but wants to think about starting nicotine patches.  Diabetic eye exam: 07/27/2018 , no retinopathy  Flu shot wants one today. Colonoscopy November 2007. Pt wants to think about this and talk about it at next visit.  Pap smear due, wants to discuss at next visit Mammogram scheduled for December   Past Medical History:  Diagnosis Date  . Diabetes mellitus   . Ganglion cyst    right wrist  . GERD (gastroesophageal reflux disease)   . History of radiation therapy 02/27/10 -04/13/10   left base of tongue, bilat neck  . Hyperlipidemia   . Hypertension   . Iron deficiency anemia   . 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 Menopause   . Thyroid disease    Hyperthyroidism  . Tobacco abuse    PT HAS QUIT   Review of Systems:  All systems were reviewed and are otherwise negative unless mentioned in the HPI.  Physical Exam:  Vitals:   06/28/19 1551 06/28/19 1640  BP: (!) 159/75 (!) 157/75  Pulse: 93 93  Temp: 98.4 F (36.9 C)   TempSrc: Oral   SpO2: 100%   Weight: 122 lb 12.8 oz (55.7 kg)   Height: 5\' 2"  (1.575 m)    Physical Exam Vitals signs reviewed.  Constitutional:      General: She is not in acute distress.    Appearance: Normal appearance. She is normal weight. She is not ill-appearing, toxic-appearing or diaphoretic.  HENT:     Head: Normocephalic  and atraumatic.  Eyes:     Extraocular Movements: Extraocular movements intact.  Cardiovascular:     Rate and Rhythm: Normal rate and regular rhythm.     Pulses: Normal pulses.     Heart sounds: Normal heart sounds. No murmur. No friction rub. No gallop.   Pulmonary:     Effort: Pulmonary effort is normal. No respiratory distress.     Breath sounds: Normal breath sounds. No wheezing or rales.  Abdominal:     General: Abdomen is flat. Bowel sounds are normal. There is no distension.     Palpations: Abdomen is soft.     Tenderness: There is no abdominal tenderness.  Musculoskeletal: Normal range of motion.        General: No deformity.     Right lower leg: No edema.     Left lower leg: No edema.  Skin:    General: Skin is warm.  Neurological:     General: No focal deficit present.     Mental Status: She is alert and oriented to person, place, and time.  Psychiatric:        Mood and Affect: Mood normal.    Assessment & Plan:   See Encounters Tab for problem based charting.  Patient seen with  Dr. Dareen Piano

## 2019-06-28 NOTE — Assessment & Plan Note (Signed)
Patient is compliant with all her medications.  She currently takes lisinopril, clonidine patch, verapamil and Lasix.  Patient says her blood pressures usually run in the 120s when she is at home but were elevated during her visit today at 172 and 091 systolic.  Instructed patient to record blood pressures and return in 1 week.  Patient in agreement -Continue lisinopril 40 mg daily -Continue clonidine patch 0.2 mg/24 hours daily -Continue verapamil 360 mg daily - Follow-up in 1 week to follow-up blood pressures

## 2019-06-28 NOTE — Assessment & Plan Note (Signed)
Patient currently smokes about a half a pack a day every 2 to 3 days.  She expressed a desire to cut back but not ready to start medications at this time.  She would like to think about it and discuss it at her next visit.

## 2019-06-28 NOTE — Patient Instructions (Addendum)
Thank you for allowing [place] to take care of you during your visit today.  Below is a summary of what we discussed  1.  Hypertension: Take your blood pressure every day and write down the numbers.  We will follow this up at your next visit. - Continue lisinopril 40 mg daily - Continue clonidine patch daily - Continue verapamil 360 mg daily -Follow-up in 1 week to recheck blood pressures  2.  Diabetes: Your hemoglobin A1c was 7.0 today.  Bring your glucometer to your next visit so we can go over your blood sugar numbers. - Continue Janumet 50-1000 mg daily - Continue Lantus 15 units in the AM & PM - Continue NovoLog 7 units with breakfast, 10 units with lunch, and 8 units with dinner.  3.  Smoking - Think about if you would like to start nicotine patches.  We will discuss this at your next visit  If you have any questions or concerns, please feel free to reach out to [place].

## 2019-06-29 LAB — BMP8+ANION GAP
Anion Gap: 19 mmol/L — ABNORMAL HIGH (ref 10.0–18.0)
BUN/Creatinine Ratio: 9 — ABNORMAL LOW (ref 12–28)
BUN: 26 mg/dL (ref 8–27)
CO2: 20 mmol/L (ref 20–29)
Calcium: 9 mg/dL (ref 8.7–10.3)
Chloride: 101 mmol/L (ref 96–106)
Creatinine, Ser: 2.94 mg/dL — ABNORMAL HIGH (ref 0.57–1.00)
GFR calc Af Amer: 19 mL/min/{1.73_m2} — ABNORMAL LOW (ref >59)
GFR calc non Af Amer: 16 mL/min/{1.73_m2} — ABNORMAL LOW (ref >59)
Glucose: 185 mg/dL — ABNORMAL HIGH (ref 65–99)
Potassium: 4.3 mmol/L (ref 3.5–5.2)
Sodium: 140 mmol/L (ref 134–144)

## 2019-06-29 LAB — TSH: TSH: 5.52 u[IU]/mL — ABNORMAL HIGH (ref 0.450–4.500)

## 2019-07-05 NOTE — Progress Notes (Signed)
Internal Medicine Clinic Attending ° °I saw and evaluated the patient.  I personally confirmed the key portions of the history and exam documented by Dr. Alexander and I reviewed pertinent patient test results.  The assessment, diagnosis, and plan were formulated together and I agree with the documentation in the resident’s note.  °

## 2019-07-06 ENCOUNTER — Telehealth: Payer: Self-pay | Admitting: Internal Medicine

## 2019-07-06 NOTE — Telephone Encounter (Signed)
I called and talked to Susan Evans for about 5 minutes. We discussed her elevated Cr levels on her BMP from last week. I instructed the patient to stop taking Janumet and Lisinopril for now and follow up with me in clinic within a week. The patient voiced understanding. She says she has a nephrologist, Dr. Hollie Salk, who she saw within the past week. The last documentation from Dr. Hollie Salk in the chart is February 2019. I will try to reach out to find the records of her most recent visit.   [name], MD Internal Medicine, PGY1 Pager: [phone]  07/06/2019,2:59 PM

## 2019-07-06 NOTE — Telephone Encounter (Signed)
I called and talked to Susan Evans for about 5 minutes. We discussed her elevated Cr levels on her BMP from last week. I instructed the patient to stop taking Janumet and Lisinopril for now and follow up with me in clinic within a week. The patient voiced understanding. She says she has a nephrologist, Dr. Hollie Salk, who she saw within the past week. The last documentation from Dr. Hollie Salk in the chart is February 2019. I will try to reach out to find the records of her most recent visit.   Susan Plater, MD Internal Medicine, PGY1 Pager: [phone]  07/06/2019,2:59 PM

## 2019-07-15 ENCOUNTER — Ambulatory Visit: Payer: Medicaid Other

## 2019-07-15 ENCOUNTER — Encounter: Payer: Self-pay | Admitting: Internal Medicine

## 2019-07-20 ENCOUNTER — Other Ambulatory Visit: Payer: Self-pay | Admitting: Internal Medicine

## 2019-07-20 DIAGNOSIS — E785 Hyperlipidemia, unspecified: Secondary | ICD-10-CM

## 2019-07-31 ENCOUNTER — Other Ambulatory Visit: Payer: Self-pay | Admitting: Internal Medicine

## 2019-07-31 DIAGNOSIS — M810 Age-related osteoporosis without current pathological fracture: Secondary | ICD-10-CM

## 2019-08-12 ENCOUNTER — Emergency Department (HOSPITAL_COMMUNITY): Payer: Medicaid Other

## 2019-08-12 ENCOUNTER — Inpatient Hospital Stay (HOSPITAL_COMMUNITY)
Admission: EM | Admit: 2019-08-12 | Discharge: 2019-08-13 | DRG: 392 | Discharge status: Against Medical Advice | Payer: Medicaid Other | Attending: Internal Medicine | Admitting: Internal Medicine

## 2019-08-12 ENCOUNTER — Encounter (HOSPITAL_COMMUNITY): Payer: Self-pay | Admitting: Emergency Medicine

## 2019-08-12 ENCOUNTER — Other Ambulatory Visit: Payer: Self-pay

## 2019-08-12 DIAGNOSIS — D649 Anemia, unspecified: Secondary | ICD-10-CM

## 2019-08-12 DIAGNOSIS — E860 Dehydration: Secondary | ICD-10-CM | POA: Diagnosis present

## 2019-08-12 DIAGNOSIS — D638 Anemia in other chronic diseases classified elsewhere: Secondary | ICD-10-CM | POA: Diagnosis present

## 2019-08-12 DIAGNOSIS — Z79899 Other long term (current) drug therapy: Secondary | ICD-10-CM

## 2019-08-12 DIAGNOSIS — E872 Acidosis: Secondary | ICD-10-CM

## 2019-08-12 DIAGNOSIS — M810 Age-related osteoporosis without current pathological fracture: Secondary | ICD-10-CM | POA: Diagnosis present

## 2019-08-12 DIAGNOSIS — N179 Acute kidney failure, unspecified: Secondary | ICD-10-CM

## 2019-08-12 DIAGNOSIS — Z794 Long term (current) use of insulin: Secondary | ICD-10-CM

## 2019-08-12 DIAGNOSIS — Z823 Family history of stroke: Secondary | ICD-10-CM

## 2019-08-12 DIAGNOSIS — R52 Pain, unspecified: Secondary | ICD-10-CM | POA: Diagnosis not present

## 2019-08-12 DIAGNOSIS — A09 Infectious gastroenteritis and colitis, unspecified: Principal | ICD-10-CM | POA: Diagnosis present

## 2019-08-12 DIAGNOSIS — F17210 Nicotine dependence, cigarettes, uncomplicated: Secondary | ICD-10-CM

## 2019-08-12 DIAGNOSIS — R000 Tachycardia, unspecified: Secondary | ICD-10-CM | POA: Diagnosis not present

## 2019-08-12 DIAGNOSIS — K5900 Constipation, unspecified: Secondary | ICD-10-CM | POA: Diagnosis not present

## 2019-08-12 DIAGNOSIS — E11649 Type 2 diabetes mellitus with hypoglycemia without coma: Secondary | ICD-10-CM | POA: Diagnosis not present

## 2019-08-12 DIAGNOSIS — Z833 Family history of diabetes mellitus: Secondary | ICD-10-CM

## 2019-08-12 DIAGNOSIS — N183 Chronic kidney disease, stage 3 unspecified: Secondary | ICD-10-CM | POA: Diagnosis present

## 2019-08-12 DIAGNOSIS — A419 Sepsis, unspecified organism: Secondary | ICD-10-CM | POA: Diagnosis not present

## 2019-08-12 DIAGNOSIS — I129 Hypertensive chronic kidney disease with stage 1 through stage 4 chronic kidney disease, or unspecified chronic kidney disease: Secondary | ICD-10-CM | POA: Diagnosis present

## 2019-08-12 DIAGNOSIS — Z923 Personal history of irradiation: Secondary | ICD-10-CM

## 2019-08-12 DIAGNOSIS — N189 Chronic kidney disease, unspecified: Secondary | ICD-10-CM

## 2019-08-12 DIAGNOSIS — Z8521 Personal history of malignant neoplasm of larynx: Secondary | ICD-10-CM

## 2019-08-12 DIAGNOSIS — R6520 Severe sepsis without septic shock: Secondary | ICD-10-CM | POA: Diagnosis not present

## 2019-08-12 DIAGNOSIS — Z87891 Personal history of nicotine dependence: Secondary | ICD-10-CM

## 2019-08-12 DIAGNOSIS — E05 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: Secondary | ICD-10-CM

## 2019-08-12 DIAGNOSIS — I10 Essential (primary) hypertension: Secondary | ICD-10-CM | POA: Diagnosis not present

## 2019-08-12 DIAGNOSIS — Z9002 Acquired absence of larynx: Secondary | ICD-10-CM

## 2019-08-12 DIAGNOSIS — E785 Hyperlipidemia, unspecified: Secondary | ICD-10-CM | POA: Diagnosis present

## 2019-08-12 DIAGNOSIS — R0902 Hypoxemia: Secondary | ICD-10-CM | POA: Diagnosis not present

## 2019-08-12 DIAGNOSIS — Z8249 Family history of ischemic heart disease and other diseases of the circulatory system: Secondary | ICD-10-CM

## 2019-08-12 DIAGNOSIS — E1165 Type 2 diabetes mellitus with hyperglycemia: Secondary | ICD-10-CM | POA: Diagnosis not present

## 2019-08-12 DIAGNOSIS — K219 Gastro-esophageal reflux disease without esophagitis: Secondary | ICD-10-CM | POA: Diagnosis present

## 2019-08-12 DIAGNOSIS — Z85818 Personal history of malignant neoplasm of other sites of lip, oral cavity, and pharynx: Secondary | ICD-10-CM

## 2019-08-12 DIAGNOSIS — K529 Noninfective gastroenteritis and colitis, unspecified: Secondary | ICD-10-CM | POA: Diagnosis present

## 2019-08-12 DIAGNOSIS — K861 Other chronic pancreatitis: Secondary | ICD-10-CM | POA: Diagnosis present

## 2019-08-12 DIAGNOSIS — N134 Hydroureter: Secondary | ICD-10-CM | POA: Diagnosis present

## 2019-08-12 DIAGNOSIS — E1122 Type 2 diabetes mellitus with diabetic chronic kidney disease: Secondary | ICD-10-CM | POA: Diagnosis not present

## 2019-08-12 DIAGNOSIS — Z841 Family history of disorders of kidney and ureter: Secondary | ICD-10-CM

## 2019-08-12 DIAGNOSIS — E118 Type 2 diabetes mellitus with unspecified complications: Secondary | ICD-10-CM | POA: Diagnosis present

## 2019-08-12 DIAGNOSIS — Z20828 Contact with and (suspected) exposure to other viral communicable diseases: Secondary | ICD-10-CM | POA: Diagnosis present

## 2019-08-12 DIAGNOSIS — Z888 Allergy status to other drugs, medicaments and biological substances status: Secondary | ICD-10-CM

## 2019-08-12 DIAGNOSIS — Z880 Allergy status to penicillin: Secondary | ICD-10-CM

## 2019-08-12 HISTORY — DX: Acute kidney failure, unspecified: N17.9

## 2019-08-12 LAB — BASIC METABOLIC PANEL
Anion gap: 14 (ref 5–15)
BUN: 24 mg/dL — ABNORMAL HIGH (ref 8–23)
CO2: 23 mmol/L (ref 22–32)
Calcium: 7.6 mg/dL — ABNORMAL LOW (ref 8.9–10.3)
Chloride: 102 mmol/L (ref 98–111)
Creatinine, Ser: 3.07 mg/dL — ABNORMAL HIGH (ref 0.44–1.00)
GFR calc Af Amer: 18 mL/min — ABNORMAL LOW (ref >60)
GFR calc non Af Amer: 15 mL/min — ABNORMAL LOW (ref >60)
Glucose, Bld: 126 mg/dL — ABNORMAL HIGH (ref 70–99)
Potassium: 3.6 mmol/L (ref 3.5–5.1)
Sodium: 139 mmol/L (ref 135–145)

## 2019-08-12 LAB — COMPREHENSIVE METABOLIC PANEL
ALT: 17 U/L (ref 0–44)
AST: 27 U/L (ref 15–41)
Albumin: 2 g/dL — ABNORMAL LOW (ref 3.5–5.0)
Alkaline Phosphatase: 114 U/L (ref 38–126)
Anion gap: 17 — ABNORMAL HIGH (ref 5–15)
BUN: 26 mg/dL — ABNORMAL HIGH (ref 8–23)
CO2: 19 mmol/L — ABNORMAL LOW (ref 22–32)
Calcium: 8.1 mg/dL — ABNORMAL LOW (ref 8.9–10.3)
Chloride: 101 mmol/L (ref 98–111)
Creatinine, Ser: 3.54 mg/dL — ABNORMAL HIGH (ref 0.44–1.00)
GFR calc Af Amer: 15 mL/min — ABNORMAL LOW (ref >60)
GFR calc non Af Amer: 13 mL/min — ABNORMAL LOW (ref >60)
Glucose, Bld: 240 mg/dL — ABNORMAL HIGH (ref 70–99)
Potassium: 3.1 mmol/L — ABNORMAL LOW (ref 3.5–5.1)
Sodium: 137 mmol/L (ref 135–145)
Total Bilirubin: 0.3 mg/dL (ref 0.3–1.2)
Total Protein: 5.4 g/dL — ABNORMAL LOW (ref 6.5–8.1)

## 2019-08-12 LAB — CBC WITH DIFFERENTIAL/PLATELET
Abs Immature Granulocytes: 0.11 10*3/uL — ABNORMAL HIGH (ref 0.00–0.07)
Basophils Absolute: 0.1 10*3/uL (ref 0.0–0.1)
Basophils Relative: 0 %
Eosinophils Absolute: 0 10*3/uL (ref 0.0–0.5)
Eosinophils Relative: 0 %
HCT: 34.3 % — ABNORMAL LOW (ref 36.0–46.0)
Hemoglobin: 10.4 g/dL — ABNORMAL LOW (ref 12.0–15.0)
Immature Granulocytes: 0 %
Lymphocytes Relative: 2 %
Lymphs Abs: 0.5 10*3/uL — ABNORMAL LOW (ref 0.7–4.0)
MCH: 23.1 pg — ABNORMAL LOW (ref 26.0–34.0)
MCHC: 30.3 g/dL (ref 30.0–36.0)
MCV: 76.1 fL — ABNORMAL LOW (ref 80.0–100.0)
Monocytes Absolute: 1.7 10*3/uL — ABNORMAL HIGH (ref 0.1–1.0)
Monocytes Relative: 7 %
Neutro Abs: 22.6 10*3/uL — ABNORMAL HIGH (ref 1.7–7.7)
Neutrophils Relative %: 91 %
Platelets: 343 10*3/uL (ref 150–400)
RBC: 4.51 MIL/uL (ref 3.87–5.11)
RDW: 16 % — ABNORMAL HIGH (ref 11.5–15.5)
WBC: 24.9 10*3/uL — ABNORMAL HIGH (ref 4.0–10.5)
nRBC: 0 % (ref 0.0–0.2)

## 2019-08-12 LAB — GLUCOSE, CAPILLARY: Glucose-Capillary: 162 mg/dL — ABNORMAL HIGH (ref 70–99)

## 2019-08-12 LAB — URINALYSIS, ROUTINE W REFLEX MICROSCOPIC
Bilirubin Urine: NEGATIVE
Glucose, UA: 50 mg/dL — AB
Hgb urine dipstick: NEGATIVE
Ketones, ur: NEGATIVE mg/dL
Leukocytes,Ua: NEGATIVE
Nitrite: NEGATIVE
Protein, ur: 100 mg/dL — AB
Specific Gravity, Urine: 1.006 (ref 1.005–1.030)
pH: 7 (ref 5.0–8.0)

## 2019-08-12 LAB — CBG MONITORING, ED
Glucose-Capillary: 110 mg/dL — ABNORMAL HIGH (ref 70–99)
Glucose-Capillary: 145 mg/dL — ABNORMAL HIGH (ref 70–99)
Glucose-Capillary: 166 mg/dL — ABNORMAL HIGH (ref 70–99)
Glucose-Capillary: 173 mg/dL — ABNORMAL HIGH (ref 70–99)
Glucose-Capillary: 234 mg/dL — ABNORMAL HIGH (ref 70–99)
Glucose-Capillary: 37 mg/dL — CL (ref 70–99)
Glucose-Capillary: 41 mg/dL — CL (ref 70–99)
Glucose-Capillary: 51 mg/dL — ABNORMAL LOW (ref 70–99)
Glucose-Capillary: 56 mg/dL — ABNORMAL LOW (ref 70–99)

## 2019-08-12 LAB — LACTIC ACID, PLASMA
Lactic Acid, Venous: 4.6 mmol/L (ref 0.5–1.9)
Lactic Acid, Venous: 4.3 mmol/L (ref 0.5–1.9)
Lactic Acid, Venous: 2.4 mmol/L (ref 0.5–1.9)
Lactic Acid, Venous: 1.9 mmol/L (ref 0.5–1.9)

## 2019-08-12 LAB — PROTIME-INR
INR: 1 (ref 0.8–1.2)
Prothrombin Time: 12.7 seconds (ref 11.4–15.2)

## 2019-08-12 LAB — HIV ANTIBODY (ROUTINE TESTING W REFLEX): HIV Screen 4th Generation wRfx: NONREACTIVE

## 2019-08-12 LAB — LIPASE, BLOOD: Lipase: 13 U/L (ref 11–51)

## 2019-08-12 LAB — SARS CORONAVIRUS 2 (TAT 6-24 HRS): SARS Coronavirus 2: NEGATIVE

## 2019-08-12 MED ORDER — AZITHROMYCIN 1 G PO PACK
1.0000 g | PACK | Freq: Once | ORAL | Status: AC
  Administered 2019-08-12: 15:00:00 1 g via ORAL
  Filled 2019-08-12: qty 1

## 2019-08-12 MED ORDER — METRONIDAZOLE IN NACL 5-0.79 MG/ML-% IV SOLN
500.0000 mg | Freq: Once | INTRAVENOUS | Status: AC
  Administered 2019-08-12: 500 mg via INTRAVENOUS
  Filled 2019-08-12: qty 100

## 2019-08-12 MED ORDER — SODIUM CHLORIDE 0.9 % IV BOLUS (SEPSIS): 500.0000 mL | Freq: Once | INTRAVENOUS | Status: DC

## 2019-08-12 MED ORDER — LORATADINE 10 MG PO TABS
10.0000 mg | ORAL_TABLET | Freq: Every day | ORAL | Status: DC
  Administered 2019-08-12 – 2019-08-13 (×2): 10 mg via ORAL
  Filled 2019-08-12 (×2): qty 1

## 2019-08-12 MED ORDER — INSULIN ASPART 100 UNIT/ML ~~LOC~~ SOLN
0.0000 [IU] | Freq: Three times a day (TID) | SUBCUTANEOUS | Status: DC
  Administered 2019-08-12: 1 [IU] via SUBCUTANEOUS

## 2019-08-12 MED ORDER — SODIUM CHLORIDE 0.9 % IV BOLUS (SEPSIS): 1000.0000 mL | Freq: Once | INTRAVENOUS | Status: DC

## 2019-08-12 MED ORDER — SODIUM CHLORIDE 0.9 % IV SOLN
2.0000 g | INTRAVENOUS | Status: DC
  Administered 2019-08-12: 2 g via INTRAVENOUS
  Filled 2019-08-12: qty 2

## 2019-08-12 MED ORDER — ACETAMINOPHEN 650 MG RE SUPP: 650.0000 mg | Freq: Four times a day (QID) | RECTAL | Status: DC | PRN

## 2019-08-12 MED ORDER — ONDANSETRON HCL 4 MG PO TABS: 4.0000 mg | ORAL_TABLET | Freq: Four times a day (QID) | ORAL | Status: DC | PRN

## 2019-08-12 MED ORDER — SODIUM CHLORIDE 0.9 % IV SOLN
2.0000 g | Freq: Once | INTRAVENOUS | Status: DC
  Filled 2019-08-12: qty 2

## 2019-08-12 MED ORDER — CALCIUM CARBONATE-VITAMIN D 500-200 MG-UNIT PO TABS
1.0000 | ORAL_TABLET | Freq: Every day | ORAL | Status: DC
  Filled 2019-08-12: qty 1

## 2019-08-12 MED ORDER — ENOXAPARIN SODIUM 30 MG/0.3ML ~~LOC~~ SOLN
30.0000 mg | Freq: Every day | SUBCUTANEOUS | Status: DC
  Administered 2019-08-12: 30 mg via SUBCUTANEOUS
  Filled 2019-08-12 (×2): qty 0.3

## 2019-08-12 MED ORDER — ONDANSETRON HCL 4 MG/2ML IJ SOLN: 4.0000 mg | Freq: Four times a day (QID) | INTRAMUSCULAR | Status: DC | PRN

## 2019-08-12 MED ORDER — ONDANSETRON HCL 4 MG/2ML IJ SOLN
4.0000 mg | Freq: Once | INTRAMUSCULAR | Status: AC
  Administered 2019-08-12: 03:00:00 4 mg via INTRAVENOUS
  Filled 2019-08-12: qty 2

## 2019-08-12 MED ORDER — CLONIDINE HCL 0.2 MG/24HR TD PTWK: 0.2000 mg | MEDICATED_PATCH | TRANSDERMAL | Status: DC

## 2019-08-12 MED ORDER — LABETALOL HCL 5 MG/ML IV SOLN
5.0000 mg | Freq: Four times a day (QID) | INTRAVENOUS | Status: DC | PRN
  Administered 2019-08-13: 5 mg via INTRAVENOUS
  Filled 2019-08-12: qty 4

## 2019-08-12 MED ORDER — ACETAMINOPHEN 325 MG PO TABS: 650.0000 mg | ORAL_TABLET | Freq: Four times a day (QID) | ORAL | Status: DC | PRN

## 2019-08-12 MED ORDER — VERAPAMIL HCL ER 180 MG PO TBCR
360.0000 mg | EXTENDED_RELEASE_TABLET | Freq: Every day | ORAL | Status: DC
  Administered 2019-08-12 – 2019-08-13 (×2): 360 mg via ORAL
  Filled 2019-08-12 (×2): qty 2

## 2019-08-12 MED ORDER — SODIUM CHLORIDE 0.9 % IV BOLUS (SEPSIS): 250.0000 mL | Freq: Once | INTRAVENOUS | Status: DC

## 2019-08-12 MED ORDER — VITAMIN D 25 MCG (1000 UNIT) PO TABS
2000.0000 [IU] | ORAL_TABLET | Freq: Every day | ORAL | Status: DC
  Administered 2019-08-12 – 2019-08-13 (×2): 2000 [IU] via ORAL
  Filled 2019-08-12 (×2): qty 2

## 2019-08-12 MED ORDER — SODIUM CHLORIDE 0.9 % IV BOLUS (SEPSIS)
500.0000 mL | Freq: Once | INTRAVENOUS | Status: AC
  Administered 2019-08-12: 03:00:00 500 mL via INTRAVENOUS

## 2019-08-12 MED ORDER — LACTATED RINGERS IV SOLN
INTRAVENOUS | Status: AC
  Administered 2019-08-12: 09:00:00 via INTRAVENOUS

## 2019-08-12 MED ORDER — INSULIN ASPART 100 UNIT/ML ~~LOC~~ SOLN: 0.0000 [IU] | Freq: Every day | SUBCUTANEOUS | Status: DC

## 2019-08-12 MED ORDER — SODIUM CHLORIDE 0.9 % IV BOLUS (SEPSIS)
1000.0000 mL | Freq: Once | INTRAVENOUS | Status: AC
  Administered 2019-08-12: 03:00:00 1000 mL via INTRAVENOUS

## 2019-08-12 MED ORDER — INSULIN GLARGINE 100 UNIT/ML ~~LOC~~ SOLN
10.0000 [IU] | Freq: Two times a day (BID) | SUBCUTANEOUS | Status: DC
  Administered 2019-08-12: 11:00:00 10 [IU] via SUBCUTANEOUS
  Filled 2019-08-12 (×4): qty 0.1

## 2019-08-12 MED ORDER — ASPIRIN EC 81 MG PO TBEC
81.0000 mg | DELAYED_RELEASE_TABLET | Freq: Every day | ORAL | Status: DC
  Administered 2019-08-12 – 2019-08-13 (×2): 81 mg via ORAL
  Filled 2019-08-12 (×2): qty 1

## 2019-08-12 MED ORDER — SODIUM CHLORIDE 0.9 % IV SOLN: 3.0000 g | Freq: Two times a day (BID) | INTRAVENOUS | Status: DC

## 2019-08-12 MED ORDER — ATORVASTATIN CALCIUM 40 MG PO TABS
40.0000 mg | ORAL_TABLET | Freq: Every day | ORAL | Status: DC
  Administered 2019-08-12 – 2019-08-13 (×2): 40 mg via ORAL
  Filled 2019-08-12 (×2): qty 1

## 2019-08-12 MED ORDER — PANTOPRAZOLE SODIUM 40 MG PO TBEC: 40.0000 mg | DELAYED_RELEASE_TABLET | Freq: Every day | ORAL | Status: DC

## 2019-08-12 MED ORDER — POLYVINYL ALCOHOL 1.4 % OP SOLN: Freq: Every day | OPHTHALMIC | Status: DC | PRN

## 2019-08-12 MED ORDER — DEXTROSE 50 % IV SOLN
INTRAVENOUS | Status: AC
  Administered 2019-08-12: 22:00:00
  Filled 2019-08-12: qty 50

## 2019-08-12 MED ORDER — INSULIN ASPART 100 UNIT/ML ~~LOC~~ SOLN
5.0000 [IU] | Freq: Three times a day (TID) | SUBCUTANEOUS | Status: DC
  Administered 2019-08-12: 09:00:00 5 [IU] via SUBCUTANEOUS

## 2019-08-12 NOTE — Progress Notes (Addendum)
Addendum: Unasyn discontinued per MD  Pharmacy Antibiotic Note  Susan Evans is a 63 y.o. female admitted on 08/12/2019 with intra-abdominal infection.  Pharmacy has been consulted for Unasyn dosing. Cefepime and Flagyl were given at ~4:30AM. Note patient has Pencillin intolerance of "takes all of her hair out" but patient has diagnosis of Grave's disease which likely explains the prior hair loss. Patient does not have rash or swelling. Patient has an acute kidney injury so will require dose adjustment as well as delayed start time due to recent Cefepime and Flagyl. WBC and lactic acid is elevated.    Plan: Unasyn 3g IV every 12 hours - start tomorrow 11/20 at 04:00 AM.  Monitor renal function, culture results, and clinical status.  Monitor for any reactions.   Height: 5\' 2"  (157.5 cm) Weight: 120 lb (54.4 kg) IBW/kg (Calculated) : 50.1  Temp (24hrs), Avg:97.6 F (36.4 C), Min:97.6 F (36.4 C), Max:97.6 F (36.4 C)  Recent Labs  Lab 08/12/19 0106 08/12/19 0308 08/12/19 0443 08/12/19 0701 08/12/19 0728  WBC 24.9*  --   --   --   --   CREATININE 3.54*  --   --   --  3.07*  LATICACIDVEN  --  4.6* 4.3* 2.4*  --     Estimated Creatinine Clearance: 14.8 mL/min (A) (by C-G formula based on SCr of 3.07 mg/dL (H)).    Allergies  Allergen Reactions  . Hydrochlorothiazide Itching and Other (See Comments)    Hyponatremia   . Penicillins Other (See Comments)    "takes all of her hair out" .Has patient had a PCN reaction causing immediate rash, facial/tongue/throat swelling, SOB or lightheadedness with hypotension: No Has patient had a PCN reaction causing severe rash involving mucus membranes or skin necrosis: No Has patient had a PCN reaction that required hospitalization No Has patient had a PCN reaction occurring within the last 10 years: No If all of the above answers are "NO", then may proceed with Cephalosporin use.     Antimicrobials this admission: Cefepime 11/19  x1 Flagyl 11/19 x1 Unasyn 11/19 >>  Dose adjustments this admission:   Microbiology results: 11/19 COVID >> 11/19 Urine >> 11/19 Blood >>  Thank you for allowing pharmacy to be a part of this patient's care.  Sloan Leiter, PharmD, BCPS, BCCCP Clinical Pharmacist Please refer to [facility] for [place] numbers 08/12/2019 8:42 AM

## 2019-08-12 NOTE — H&P (Signed)
Date: 08/12/2019               Patient Name:  Susan Evans MRN: [id number]  DOB: 03-Jan-1956 Age / Sex: 63 y.o., female   PCP: [name], MD         Medical Service: Internal Medicine Teaching Service         Attending Physician: Dr. Velna Ochs, MD    First Contact: Dr. Delrae Rend Pager: [phone]  Second Contact: Dr. 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 History was obtained via the patient and through chart review.  Patient states that approximately three days ago she began to struggle with constipation. She tried to wait it out however yesterday got to the point that she felt she needed to have a bowel movements so she drank "diabetic tea." Shortly after drinking the "diabetic tea" she developed diffuse sharp abdominal pain. This prompted her to call EMS. She has had similar episodes in the past whenever she drank "diabetic tea." By the time she arrived to the emergency department she had a bowel movement and her abdominal pain subsided. Since being in the emergency department she has had an additional four bowel movements. She describes her bowel movements as water like without mucus or blood. Prior to the onset her symptoms she denies fevers, chills, nausea/vomiting, headaches, shortness of breath, chest pain, myalgias, arthralgias, diarrhea, polyuria, lower extremity edema, pain in the lower extremities with ambulation, new rash, decreased PO intake, excessive use NSAIDs (including ibuprofen, trim, goodie powder, BC powder). She now feels that she is back to her baseline.  She  currently lives with her boyfriend who has not been sick recently. Her daughter also frequently visits and she has been healthy. She has had no other sick contact that she knows of. She was not been on antibiotics recently. She is on omeprazole.  Meds:  No current facility-administered medications on file prior to encounter.    Current Outpatient Medications on File Prior to Encounter  Medication Sig Dispense Refill  . ACCU-CHEK FASTCLIX LANCETS MISC 5 (five) times daily Diagnosis code E11.9 204 each 8  . atorvastatin (LIPITOR) 40 MG tablet TAKE 1 TABLET BY MOUTH EVERY DAY 90 tablet 0  . Blood Glucose Monitoring Suppl (ACCU-CHEK AVIVA PLUS) w/Device KIT Use to test blood glucose  3 times daily 1 kit 0  . calcium-vitamin D (CALCIUM 500+D HIGH POTENCY) 500-400 MG-UNIT tablet Take 1 tablet by mouth daily. 90 tablet 3  . cloNIDine (CATAPRES - DOSED IN MG/24 HR) 0.2 mg/24hr patch Place 1 patch (0.2 mg total) onto the skin every 7 (seven) days. 12 patch 3  . [facility] D3 50 MCG (2000 UT) CAPS TAKE 1 CAPSULE BY MOUTH EVERY DAY 90 capsule 3  . furosemide (LASIX) 20 MG tablet Take 1 tablet (20 mg total) by mouth 2 (two) times daily. 180 tablet 2  . glucose blood (ACCU-CHEK GUIDE) test strip 5 (five) times daily Diagnosis code E11.9 150 each 12  . insulin aspart (NOVOLOG FLEXPEN) 100 UNIT/ML FlexPen INJECT 7 UNITS WITH BREAKFAST, 10 WITH LUNCH, AND 8 WITH DINNER 15 mL 6  .  insulin glargine (LANTUS) 100 UNIT/ML injection INJECT 0.2ML INTO THE SKIN TWICE A DAY, 15 UNITS IN AM AND 15 UNITS IN EVENING 20 mL 4  . Insulin Pen Needle (B-D UF III MINI PEN NEEDLES) 31G X 5 MM MISC USE TO INJECT NOVOLOG 3 TIMES A DAY. DIAGNOSIS CODE E11.9, Z79.4 100 each 8  . Insulin Syringe-Needle U-100 31G X 15/64" 0.3 ML MISC USE TO INJECT LANTUS INSULIN TWO TIMES A DAY 180 each 3  . lisinopril (ZESTRIL) 40 MG tablet TAKE 1 TABLET BY MOUTH EVERY DAY 90 tablet 0  . loratadine (CLARITIN) 10 MG tablet Take 1 tablet (10 mg total) by mouth  daily. 30 tablet 2  . omeprazole (PRILOSEC) 20 MG capsule Take 1 capsule (20 mg total) by mouth daily. 180 capsule 1  . Polyethyl Glycol-Propyl Glycol (SYSTANE OP) Apply 1 drop to eye daily as needed (for dry eyes).    . sitaGLIPtin-metformin (JANUMET) 50-1000 MG tablet Take 1 tablet by mouth 2 (two) times daily with a meal. 180 tablet 2  . verapamil (CALAN-SR) 240 MG CR tablet Take 1.5 tablets (360 mg total) by mouth daily. 135 tablet 2   Allergies: Allergies as of 08/12/2019 - Review Complete 08/12/2019  Allergen Reaction Noted  . Hydrochlorothiazide Itching and Other (See Comments) 01/07/2014  . Penicillins Other (See Comments)    Past Medical History:  Diagnosis Date  . Diabetes mellitus   . Ganglion cyst    right wrist  . GERD (gastroesophageal reflux disease)   . History of radiation therapy 02/27/10 -04/13/10   left base of tongue, bilat neck  . Hyperlipidemia   . Hypertension   . Iron deficiency anemia   . 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 Menopause   . Thyroid disease    Hyperthyroidism  . Tobacco abuse    PT HAS QUIT   Family History  Problem Relation Age of Onset  . Stroke Father   . Cancer Father        unknown ca  . Hypertension Mother   . Diabetes Mother   . Chronic Renal Failure Sister   . Chronic Renal Failure Brother   . Cancer Paternal Aunt        colon ca  . Breast cancer Neg Hx    Social History: Patient is now retired. She previously used to be home health aide. She currently lives with her boyfriend. She does have a daughter who frequently stopped by. She smokes 1/3 pack per day. She does not drink alcohol or use other illicit substances.  Review of Systems: A complete ROS was negative except as per HPI.  Physical Exam: Blood pressure (!) 158/82, pulse (!) 105, temperature 97.6 F (36.4 C), temperature source Oral, resp. rate 16, height 5' 2"  (1.575 m), weight 54.4 kg, SpO2 98 %.  General: Well nourished female in no  acute distress HENT: Normocephalic, atraumatic, moist mucus membranes Pulm: Good air movement with no wheezing or crackles  CV: Tachycardic but regular rhythm, no murmurs, no rubs  Abdomen: Hypoactive bowel sounds, soft, non-distended, no tenderness to palpation  Extremities: No LE edema, unable to palpate LE pulses  Skin: Warm and dry  Neuro: Alert and oriented x 3  EKG: personally reviewed my interpretation is this tachycardia with right axis deviation. Difficult to see P wave morphology but appears normal. Normal QRS morphology. No ST segment changes. Right axis deviation new otherwise stable compared to prior.  CXR: personally reviewed my interpretation is poor inspiration  and rotation. No bony or soft tissue abnormalities. No infiltrates or opacities. Does appear to have a reticular pattern.   CT Abdomen Pelvis without Contrast  1. Findings of enteritis and possibly mild colitis. History of constipation with no bowel obstruction or abnormal stool retention. 2. 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 Consider postcontrast imaging. 3. Asymmetric thick appearance of the greater curvature stomach likely from under distension. If no recent EGD or outside comparison abdominal CT an upper GI would be reassuring. 4. Upper left hydroureter without visible obstructive process.  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 Work-up in the ED was significant for AKI, Lactic acidosis, leukocytosis with left shift, and CT abdomen/pelvis  illustrating enteritis with mild colitis. Code sepsis was initiated; antibiotics were given and IVF at 30cc/kg given. She was subsequently admitted for further evaluation and management.   Enteritis / Mild Colitis  High anion gap metabolic acidosis secondary to lactic acidosis  - Enteritis/Colitis could be secondary to infectious, ischemic, or inflammatory/autoimmune etiologies. Given her age and prior history inflammatory/autoimmune etiologies seems unlikely. 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 However, given that infectious etiologies are the most common, we will empirically cover for possible infectious organisms. Although, it should be mentioned that her presentation is not 100% consistent with any of the etiologies mentioned above and may be an incidental findings vs being secondary to constipation.  - Switch antibiotic coverage from Cefepime and metronidazole to Unasyn with plans to switch to Augmentin on discharge  - Continue aggressive IVF and trend lactic acid  - Follow blood and urine cultures   AKI - Difficult to assess baseline. Appears her baseline was ~1.1 until 2020. Since that time she has had a progressive incline (1.06 -> 1.81 -> 1.89 -> 2.94) - Her initial creatinine was 3.54. It is now trended down to 3.07 indicating pre-renal etiology  - UA unremarkable aside from some proteinuria  - CT abdomen without hydronephrosis or distended bladder but did illustrate a dilated left ureter  - Give 30 cc/kg of IVF as mentioned above. Will continue IVF  - Hold metformin, lisinopril, furosemide, and omeprazole  HTN  - Continue clonidine 0.43m/24hr patch and Verapamil 360 mg QD - Hold furosemide 20 mg BID and Lisinopril 40 mg QD in the setting of AKI  Insulin Dependent Type 2 DM  - Home medications include Novolog (7 units with breakfast, 10 units with lunch, and 8 units with dinner),  Lantus 15 units BID, and sitagliptin-metformin 50-10093mBID  - Hold metformin in the setting of AKI  - Start Novolog 5 units TID WC with SSI - Start Lantus 10 units BID  HLD - Continue atorvastatin 40 mg QHS  Graves disease - Last TSH on 06/28/2019 at 5.5 - No further work-up/intervention at this time  Incidental CT Findings  - Asymmetric thick appearance of the greater curvature stomach likely from under distension. May need referral to GI for endoscopy. 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  - Upper left hydroureter without visible obstructive process. Possible this is chronic in nature. CT was not with stone protocol so we cannot rule out a obstructive stone but it seems less likely given there is no hematuria on UA.  Diet: Carb mod  VTE ppx: Lovenox  CODE STATUS: Full Code  Dispo: Admit patient to Observation with expected length of stay less than 2 midnights.  SignedIna Homes, MD 08/12/2019, 8:02 AM  My Pager: [phone]

## 2019-08-12 NOTE — ED Provider Notes (Signed)
[facility] EMERGENCY DEPARTMENT Provider Note   CSN: [id number] Arrival date & time: 08/12/19  0047     History   Chief Complaint Chief Complaint  Patient presents with   Constipation    HPI Susan Evans is a 63 y.o. female.     The history is provided by the patient.  Constipation Severity:  Moderate Time since last bowel movement:  5 days Timing:  Constant Progression:  Worsening Chronicity:  New Relieved by:  Nothing Worsened by:  Nothing Associated symptoms: abdominal pain, nausea and vomiting   Associated symptoms: no fever   pt with history of diabetes and hypertension presents with constipation.  She reports she has not had a bowel movement up to 5 days.  She also reports vomiting.   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      Home Medications    Prior to Admission medications   Medication Sig Start Date End Date Taking? Authorizing  Provider  ACCU-CHEK FASTCLIX LANCETS MISC 5 (five) times daily Diagnosis code E11.9 10/06/18   Axel Filler, MD  atorvastatin (LIPITOR) 40 MG tablet TAKE 1 TABLET BY MOUTH EVERY DAY 07/26/19   [name], MD  Blood Glucose Monitoring Suppl (ACCU-CHEK AVIVA PLUS) w/Device KIT Use to test blood glucose  3 times daily 12/15/16   [name], MD  calcium-vitamin D (CALCIUM 500+D HIGH POTENCY) 500-400 MG-UNIT tablet Take 1 tablet by mouth daily. 09/05/17   [name], MD  cloNIDine (CATAPRES - DOSED IN MG/24 HR) 0.2 mg/24hr patch Place 1 patch (0.2 mg total) onto the skin every 7 (seven) days. 01/20/19 01/20/20  [name], MD  [facility] D3 50 MCG (2000 UT) CAPS TAKE 1 CAPSULE BY MOUTH EVERY DAY 08/03/19   [name], MD  furosemide (LASIX) 20 MG tablet Take 1 tablet (20 mg total) by mouth 2 (two) times daily. 01/20/19   [name], MD  glucose blood (ACCU-CHEK GUIDE) test strip 5 (five) times daily Diagnosis code E11.9 10/06/18   Axel Filler, MD  insulin aspart (NOVOLOG FLEXPEN) 100 UNIT/ML FlexPen INJECT 7 UNITS WITH BREAKFAST, 10 WITH LUNCH, AND 8 WITH DINNER 01/20/19   [name], MD  insulin glargine (LANTUS) 100 UNIT/ML injection INJECT 0.2ML INTO THE SKIN TWICE A DAY, 15 UNITS IN AM AND 15 UNITS IN [facility] 01/20/19  [name], MD  Insulin Pen Needle (B-D UF III MINI PEN NEEDLES) 31G X 5 MM MISC USE TO INJECT NOVOLOG 3 TIMES A DAY. DIAGNOSIS CODE E11.9, Z79.4 12/21/18   [name], MD  Insulin Syringe-Needle U-100 31G X 15/64" 0.3 ML MISC USE TO INJECT LANTUS INSULIN TWO TIMES A DAY 09/21/18   [name], MD  lisinopril (ZESTRIL) 40 MG tablet TAKE 1 TABLET BY MOUTH EVERY DAY 05/04/19   Bartholomew Crews, MD  loratadine (CLARITIN) 10 MG tablet Take 1 tablet (10 mg total) by mouth daily. 01/20/19 08/22/21  [name], MD  omeprazole (PRILOSEC) 20 MG capsule Take 1 capsule (20 mg total) by mouth daily. 01/20/19   [name], MD  Polyethyl Glycol-Propyl  Glycol (SYSTANE OP) Apply 1 drop to eye daily as needed (for dry eyes).    [provider]  sitaGLIPtin-metformin (JANUMET) 50-1000 MG tablet Take 1 tablet by mouth 2 (two) times daily with a meal. 01/20/19   [name], MD  verapamil (CALAN-SR) 240 MG CR tablet Take 1.5 tablets (360 mg total) by mouth daily. 01/20/19   [name], MD    Family History Family History  Problem Relation Age of Onset   Stroke Father    Cancer Father        unknown ca   Hypertension Mother    Diabetes Mother    Cancer Paternal Aunt        colon ca   Breast cancer Neg Hx     Social History Social History   Tobacco Use   Smoking status: Former Smoker    Packs/day: 0.00    Quit date: 02/09/2009    Years since quitting: 10.5   Smokeless tobacco: Never Used  Substance Use Topics   Alcohol use: No    Alcohol/week: 0.0 standard drinks   Drug use: No     Allergies   Hydrochlorothiazide and Penicillins   Review of Systems Review of Systems  Constitutional: Negative for fever.  Gastrointestinal: Positive for abdominal pain, constipation, nausea and vomiting.  All other systems reviewed and are negative.    Physical Exam Updated Vital Signs BP (!) 177/66 (BP Location: Right Arm)    Pulse 98    Temp 97.6 F (36.4 C) (Oral)    Resp 20    SpO2 97%   Physical Exam CONSTITUTIONAL: Elderly, frail HEAD: Normocephalic/atraumatic EYES: EOMI/PERRL, no icterus ENMT: Mucous membranes moist NECK: supple no meningeal signs SPINE/BACK:entire spine nontender CV: S1/S2 noted, no murmurs/rubs/gallops noted LUNGS: Lungs are clear to auscultation bilaterally, no apparent distress ABDOMEN: soft, diffuse mild tenderness, no rebound or guarding, bowel sounds noted throughout abdomen GU:no cva tenderness NEURO: Pt is awake/alert/appropriate, moves all extremitiesx4.  No facial droop.   EXTREMITIES: pulses normal/equal, full ROM SKIN: warm, color normal PSYCH: Anxious   ED Treatments  / Results  Labs (all labs ordered are listed, but only abnormal results are displayed) Labs Reviewed  CBC WITH DIFFERENTIAL/PLATELET - Abnormal; Notable for the following components:      Result Value   WBC 24.9 (*)    Hemoglobin 10.4 (*)    HCT 34.3 (*)    MCV 76.1 (*)    MCH 23.1 (*)    RDW 16.0 (*)    Neutro Abs 22.6 (*)    Lymphs Abs 0.5 (*)    Monocytes Absolute 1.7 (*)    Abs Immature Granulocytes 0.11 (*)    All other components within normal limits  COMPREHENSIVE METABOLIC PANEL - Abnormal; Notable for  the following components:   Potassium 3.1 (*)    CO2 19 (*)    Glucose, Bld 240 (*)    BUN 26 (*)    Creatinine, Ser 3.54 (*)    Calcium 8.1 (*)    Total Protein 5.4 (*)    Albumin 2.0 (*)    GFR calc non Af Amer 13 (*)    GFR calc Af Amer 15 (*)    Anion gap 17 (*)    All other components within normal limits  LACTIC ACID, PLASMA - Abnormal; Notable for the following components:   Lactic Acid, Venous 4.6 (*)    All other components within normal limits  LACTIC ACID, PLASMA - Abnormal; Notable for the following components:   Lactic Acid, Venous 4.3 (*)    All other components within normal limits  URINALYSIS, ROUTINE W REFLEX MICROSCOPIC - Abnormal; Notable for the following components:   Glucose, UA 50 (*)    Protein, ur 100 (*)    Bacteria, UA RARE (*)    All other components within normal limits  CBG MONITORING, ED - Abnormal; Notable for the following components:   Glucose-Capillary 234 (*)    All other components within normal limits  SARS CORONAVIRUS 2 (TAT 6-24 HRS)  CULTURE, BLOOD (ROUTINE X 2)  CULTURE, BLOOD (ROUTINE X 2)  URINE CULTURE  LIPASE, BLOOD  PROTIME-INR  BASIC METABOLIC PANEL    EKG EKG Interpretation  Date/Time:  Thursday August 12 2019 01:24:56 EST Ventricular Rate:  112 PR Interval:    QRS Duration: 72 QT Interval:  372 QTC Calculation: 508 R Axis:   111 Text Interpretation: Right and left arm electrode reversal,  interpretation assumes no reversal Sinus tachycardia Probable lateral infarct, age indeterminate Prolonged QT interval Confirmed by Ripley Fraise [id number]) on 08/12/2019 1:34:12 AM   Radiology Ct Abdomen Pelvis Wo Contrast  Result Date: 08/12/2019 CLINICAL DATA:  Constipation for 5 days EXAM: CT ABDOMEN AND PELVIS WITHOUT CONTRAST TECHNIQUE: Multidetector CT imaging of the abdomen and pelvis was performed following the standard protocol without IV contrast. COMPARISON:  None similar FINDINGS: Lower chest: Left-sided breast tissue has a similar appearance to 2011 chest PET-CT. Asymmetry is attributed to partial coverage. Hepatobiliary: 18 mm low-density in the right liver is cystic density.Full gallbladder without calcified stone or definite inflammation. Pancreas: Scattered coarse calcifications. There is likely a segment of main duct dilatation proximal to a body stone, further assessment limited without contrast. Spleen: Unremarkable. Adrenals/Urinary Tract: Negative adrenals. The left upper ureter is dilated above the pelvis. No evident urinary stone. Bilateral renal cystic densities, upper pole on the left and lower pole on the right. Unremarkable bladder. Stomach/Bowel: There is apparent small bowel wall thickening proximally with mesenteric edema. There may also be fat edema around the left colon. Normal appendix. Thicker appearance of the greater curvature stomach wall which on coronal reformats is likely from incomplete distention given the apparent removal pattern that is preserved. Vascular/Lymphatic: No acute vascular abnormality. Moderate atheromatous calcifications. No mass or adenopathy. Reproductive:Multiple calcified/hyalinized uterine fibroids. Other: No ascites or pneumoperitoneum. Musculoskeletal: No acute abnormalities. IMPRESSION: 1. Findings of enteritis and possibly mild colitis. History of constipation with no bowel obstruction or abnormal stool retention. 2. 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 Consider postcontrast imaging. 3. Asymmetric thick appearance of the greater curvature stomach likely from under distension. If no recent EGD or outside comparison abdominal CT an upper GI would be reassuring. 4. Upper left hydroureter without visible obstructive process. Electronically  Signed   By: Monte Fantasia M.D.   On: 08/12/2019 05:53   Dg Chest Port 1 View  Result Date: 08/12/2019 CLINICAL DATA:  63 year old female with sepsis. EXAM: PORTABLE CHEST 1 VIEW COMPARISON:  Chest radiographs 08/21/2012.  Chest CT 10/26/2010. FINDINGS: Portable AP semi upright view at 0335 hours. Lung volumes and mediastinal contours are stable and within normal limits. Eventration of the diaphragm, normal variant. Visualized tracheal air column is within normal limits. Allowing for portable technique the lungs are clear. No acute osseous abnormality identified. Paucity of bowel gas in the upper abdomen. IMPRESSION: No acute cardiopulmonary abnormality. Electronically Signed   By: Genevie Ann M.D.   On: 08/12/2019 03:45    Procedures .Critical Care Performed by: Ripley Fraise, MD Authorized by: Ripley Fraise, MD   Critical care provider statement:    Critical care time (minutes):  60   Critical care start time:  08/12/2019 5:00 AM   Critical care end time:  08/12/2019 6:00 AM   Critical care time was exclusive of:  Separately billable procedures and treating other patients   Critical care was necessary to treat or prevent imminent or life-threatening deterioration of the following conditions:  Sepsis   Critical care was time spent personally by me on the following activities:  Evaluation of patient's response to treatment, examination of patient, re-evaluation of patient's condition, pulse oximetry, ordering and review of radiographic studies, ordering and review of laboratory studies and discussions with consultants   I assumed direction of critical care for  this patient from another provider in my specialty: no      Medications Ordered in ED Medications  ceFEPIme (MAXIPIME) 2 g in sodium chloride 0.9 % 100 mL IVPB (0 g Intravenous Stopped 08/12/19 0546)  ondansetron (ZOFRAN) injection 4 mg (4 mg Intravenous Given 08/12/19 0305)  sodium chloride 0.9 % bolus 1,000 mL (0 mLs Intravenous Stopped 08/12/19 0546)  sodium chloride 0.9 % bolus 500 mL (0 mLs Intravenous Stopped 08/12/19 0546)  metroNIDAZOLE (FLAGYL) IVPB 500 mg (0 mg Intravenous Stopped 08/12/19 0546)     Initial Impression / Assessment and Plan / ED Course  I have reviewed the triage vital signs and the nursing notes.  Pertinent labs/imaging  results that were available during my care of the patient were reviewed by me and considered in my medical decision making (see chart for details).        1:07 AM Patient presents with constipation.  She had a bowel movement upon arrival to the ER.  After my history and physical patient reported she had to go back to the restroom.  She appears anxious at this time She also mentions ear pain and reports she may have gotten a cotton swab stuck in her left ear.  On ear exam, there is no foreign bodies in either ear canal, no signs of any infection 1:55 AM Patient has been to the restroom at least 3 times due to multiple bowel movements She reports she took something at home that has helped move her bowels.  She reports she is now feeling improved and is requesting p.o. fluids 2:43 AM Patient now vomiting.  Unable to tolerate p.o.  She has an elevated white count and is tachycardic.  We will proceed with CT imaging of abdomen/pelvis. 3:32 AM Patient found to have an elevated lactate.  Code sepsis has now been called.  IV fluids and IV antibiotics have been ordered 4:30 AM Patient is taking oral contrast.  Her heart rate is improved  to the low 100's Her abdomen is soft at this time.  Overall she appears improved CT imaging will occur at  approximately 5:30 AM 6:29 AM CT imaging reveals colitis No acute surgical process However due to severe dehydration, elevated lactate she will need to be admitted.  Patient agreeable with plan  Discussed with internal medicine resident for admission. Final Clinical Impressions(s) / ED Diagnoses   Final diagnoses:  Dehydration  AKI (acute kidney injury) [facility])  Colitis    ED Discharge Orders    None       Ripley Fraise, MD 08/12/19 [phone]

## 2019-08-12 NOTE — ED Notes (Signed)
Carb modified dinner tray ordered 

## 2019-08-12 NOTE — Progress Notes (Signed)
Pharmacy Antibiotic Note  Susan Evans is a 63 y.o. female admitted on 08/12/2019 with ?intra-abdominal infection.  Pharmacy has been consulted for Cefepime dosing. WBC elevated. Noted renal dysfunction.  Plan: Cefepime 2g IV q24h Flagyl x 1 in the ED, f/u additional coverage Trend WBC, temp, renal function  F/U infectious work-up   Height: 5\' 2"  (157.5 cm) Weight: 120 lb (54.4 kg) IBW/kg (Calculated) : 50.1  Temp (24hrs), Avg:97.6 F (36.4 C), Min:97.6 F (36.4 C), Max:97.6 F (36.4 C)  Recent Labs  Lab 08/12/19 0106 08/12/19 0308  WBC 24.9*  --   CREATININE 3.54*  --   LATICACIDVEN  --  4.6*    Estimated Creatinine Clearance: 12.9 mL/min (A) (by C-G formula based on SCr of 3.54 mg/dL (H)).    Allergies  Allergen Reactions  . Hydrochlorothiazide Itching and Other (See Comments)    Hyponatremia   . Penicillins Other (See Comments)    "takes all of her hair out" .Has patient had a PCN reaction causing immediate rash, facial/tongue/throat swelling, SOB or lightheadedness with hypotension: No Has patient had a PCN reaction causing severe rash involving mucus membranes or skin necrosis: No Has patient had a PCN reaction that required hospitalization No Has patient had a PCN reaction occurring within the last 10 years: No If all of the above answers are "NO", then may proceed with Cephalosporin use.     Narda Bonds, PharmD, BCPS Clinical Pharmacist Phone: [phone]

## 2019-08-12 NOTE — ED Notes (Signed)
Sent text page to [facility] to inform about CBG and D50 administration

## 2019-08-12 NOTE — ED Triage Notes (Signed)
Pt arrived [facility] with complaint of constipation. Pt. Stated unable to have bowel movement since Saturday (5 days). EMS reported Hr 585, with systolic BP 277'O; blood glucose 323. EMS started 500 ml N/S. Had received about 1/4 of bag on arrival. Pt. Had a bowel movement  on arrival. Stool was very loose.

## 2019-08-13 DIAGNOSIS — N179 Acute kidney failure, unspecified: Secondary | ICD-10-CM | POA: Diagnosis not present

## 2019-08-13 DIAGNOSIS — K5900 Constipation, unspecified: Secondary | ICD-10-CM | POA: Diagnosis not present

## 2019-08-13 DIAGNOSIS — Z823 Family history of stroke: Secondary | ICD-10-CM | POA: Diagnosis not present

## 2019-08-13 DIAGNOSIS — Z79899 Other long term (current) drug therapy: Secondary | ICD-10-CM | POA: Diagnosis not present

## 2019-08-13 DIAGNOSIS — K861 Other chronic pancreatitis: Secondary | ICD-10-CM | POA: Diagnosis present

## 2019-08-13 DIAGNOSIS — Z9002 Acquired absence of larynx: Secondary | ICD-10-CM | POA: Diagnosis not present

## 2019-08-13 DIAGNOSIS — E05 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: Secondary | ICD-10-CM | POA: Diagnosis not present

## 2019-08-13 DIAGNOSIS — N183 Chronic kidney disease, stage 3 unspecified: Secondary | ICD-10-CM | POA: Diagnosis not present

## 2019-08-13 DIAGNOSIS — Z20828 Contact with and (suspected) exposure to other viral communicable diseases: Secondary | ICD-10-CM | POA: Diagnosis present

## 2019-08-13 DIAGNOSIS — E1122 Type 2 diabetes mellitus with diabetic chronic kidney disease: Secondary | ICD-10-CM | POA: Diagnosis not present

## 2019-08-13 DIAGNOSIS — A419 Sepsis, unspecified organism: Secondary | ICD-10-CM | POA: Diagnosis not present

## 2019-08-13 DIAGNOSIS — Z888 Allergy status to other drugs, medicaments and biological substances status: Secondary | ICD-10-CM | POA: Diagnosis not present

## 2019-08-13 DIAGNOSIS — Z8249 Family history of ischemic heart disease and other diseases of the circulatory system: Secondary | ICD-10-CM | POA: Diagnosis not present

## 2019-08-13 DIAGNOSIS — E785 Hyperlipidemia, unspecified: Secondary | ICD-10-CM | POA: Diagnosis present

## 2019-08-13 DIAGNOSIS — Z880 Allergy status to penicillin: Secondary | ICD-10-CM | POA: Diagnosis not present

## 2019-08-13 DIAGNOSIS — R6520 Severe sepsis without septic shock: Secondary | ICD-10-CM | POA: Diagnosis not present

## 2019-08-13 DIAGNOSIS — I10 Essential (primary) hypertension: Secondary | ICD-10-CM | POA: Diagnosis not present

## 2019-08-13 DIAGNOSIS — Z85818 Personal history of malignant neoplasm of other sites of lip, oral cavity, and pharynx: Secondary | ICD-10-CM | POA: Diagnosis not present

## 2019-08-13 DIAGNOSIS — D649 Anemia, unspecified: Secondary | ICD-10-CM | POA: Diagnosis not present

## 2019-08-13 DIAGNOSIS — M810 Age-related osteoporosis without current pathological fracture: Secondary | ICD-10-CM | POA: Diagnosis present

## 2019-08-13 DIAGNOSIS — E860 Dehydration: Secondary | ICD-10-CM | POA: Diagnosis not present

## 2019-08-13 DIAGNOSIS — K219 Gastro-esophageal reflux disease without esophagitis: Secondary | ICD-10-CM | POA: Diagnosis present

## 2019-08-13 DIAGNOSIS — I129 Hypertensive chronic kidney disease with stage 1 through stage 4 chronic kidney disease, or unspecified chronic kidney disease: Secondary | ICD-10-CM | POA: Diagnosis not present

## 2019-08-13 DIAGNOSIS — Z794 Long term (current) use of insulin: Secondary | ICD-10-CM | POA: Diagnosis not present

## 2019-08-13 DIAGNOSIS — A09 Infectious gastroenteritis and colitis, unspecified: Secondary | ICD-10-CM | POA: Diagnosis present

## 2019-08-13 DIAGNOSIS — Z833 Family history of diabetes mellitus: Secondary | ICD-10-CM | POA: Diagnosis not present

## 2019-08-13 DIAGNOSIS — N134 Hydroureter: Secondary | ICD-10-CM | POA: Diagnosis not present

## 2019-08-13 DIAGNOSIS — Z5329 Procedure and treatment not carried out because of patient's decision for other reasons: Secondary | ICD-10-CM

## 2019-08-13 DIAGNOSIS — E11649 Type 2 diabetes mellitus with hypoglycemia without coma: Secondary | ICD-10-CM | POA: Diagnosis not present

## 2019-08-13 DIAGNOSIS — K529 Noninfective gastroenteritis and colitis, unspecified: Secondary | ICD-10-CM | POA: Diagnosis not present

## 2019-08-13 DIAGNOSIS — Z923 Personal history of irradiation: Secondary | ICD-10-CM | POA: Diagnosis not present

## 2019-08-13 DIAGNOSIS — Z87891 Personal history of nicotine dependence: Secondary | ICD-10-CM | POA: Diagnosis not present

## 2019-08-13 DIAGNOSIS — Z8521 Personal history of malignant neoplasm of larynx: Secondary | ICD-10-CM | POA: Diagnosis not present

## 2019-08-13 LAB — BASIC METABOLIC PANEL
Anion gap: 10 (ref 5–15)
BUN: 25 mg/dL — ABNORMAL HIGH (ref 8–23)
CO2: 23 mmol/L (ref 22–32)
Calcium: 7.6 mg/dL — ABNORMAL LOW (ref 8.9–10.3)
Chloride: 101 mmol/L (ref 98–111)
Creatinine, Ser: 3.15 mg/dL — ABNORMAL HIGH (ref 0.44–1.00)
GFR calc Af Amer: 17 mL/min — ABNORMAL LOW (ref >60)
GFR calc non Af Amer: 15 mL/min — ABNORMAL LOW (ref >60)
Glucose, Bld: 145 mg/dL — ABNORMAL HIGH (ref 70–99)
Potassium: 4 mmol/L (ref 3.5–5.1)
Sodium: 134 mmol/L — ABNORMAL LOW (ref 135–145)

## 2019-08-13 LAB — CBC
HCT: 25.9 % — ABNORMAL LOW (ref 36.0–46.0)
Hemoglobin: 8.1 g/dL — ABNORMAL LOW (ref 12.0–15.0)
MCH: 23.2 pg — ABNORMAL LOW (ref 26.0–34.0)
MCHC: 31.3 g/dL (ref 30.0–36.0)
MCV: 74.2 fL — ABNORMAL LOW (ref 80.0–100.0)
Platelets: 308 10*3/uL (ref 150–400)
RBC: 3.49 MIL/uL — ABNORMAL LOW (ref 3.87–5.11)
RDW: 16.2 % — ABNORMAL HIGH (ref 11.5–15.5)
WBC: 23.3 10*3/uL — ABNORMAL HIGH (ref 4.0–10.5)
nRBC: 0 % (ref 0.0–0.2)

## 2019-08-13 LAB — GLUCOSE, CAPILLARY
Glucose-Capillary: 140 mg/dL — ABNORMAL HIGH (ref 70–99)
Glucose-Capillary: 50 mg/dL — ABNORMAL LOW (ref 70–99)
Glucose-Capillary: 140 mg/dL — ABNORMAL HIGH (ref 70–99)
Glucose-Capillary: 43 mg/dL — CL (ref 70–99)
Glucose-Capillary: 49 mg/dL — ABNORMAL LOW (ref 70–99)
Glucose-Capillary: 109 mg/dL — ABNORMAL HIGH (ref 70–99)

## 2019-08-13 LAB — RETICULOCYTES
Immature Retic Fract: 12.7 % (ref 2.3–15.9)
RBC.: 3.75 MIL/uL — ABNORMAL LOW (ref 3.87–5.11)
Retic Count, Absolute: 81 10*3/uL (ref 19.0–186.0)
Retic Ct Pct: 2.2 % (ref 0.4–3.1)

## 2019-08-13 LAB — URINE CULTURE: Culture: NO GROWTH

## 2019-08-13 LAB — IRON AND TIBC
Iron: 45 ug/dL (ref 28–170)
Saturation Ratios: 31 % (ref 10.4–31.8)
TIBC: 144 ug/dL — ABNORMAL LOW (ref 250–450)
UIBC: 99 ug/dL

## 2019-08-13 LAB — FERRITIN: Ferritin: 483 ng/mL — ABNORMAL HIGH (ref 11–307)

## 2019-08-13 MED ORDER — CLONIDINE HCL 0.2 MG/24HR TD PTWK
0.2000 mg | MEDICATED_PATCH | TRANSDERMAL | Status: DC
  Filled 2019-08-13: qty 1

## 2019-08-13 MED ORDER — LACTATED RINGERS IV SOLN
INTRAVENOUS | Status: DC
  Administered 2019-08-13: 12:00:00 via INTRAVENOUS

## 2019-08-13 MED ORDER — HYDRALAZINE HCL 50 MG PO TABS: 50.0000 mg | ORAL_TABLET | Freq: Two times a day (BID) | ORAL | Status: DC

## 2019-08-13 MED ORDER — HYDRALAZINE HCL 50 MG PO TABS
50.0000 mg | ORAL_TABLET | Freq: Three times a day (TID) | ORAL | Status: DC
  Administered 2019-08-13: 50 mg via ORAL
  Filled 2019-08-13: qty 1

## 2019-08-13 MED ORDER — DEXTROSE 50 % IV SOLN
INTRAVENOUS | Status: AC
  Administered 2019-08-13: 25 mL
  Filled 2019-08-13: qty 50

## 2019-08-13 NOTE — Progress Notes (Signed)
Paged by RN regarding patient wanting to leave AMA. Assessed patient at bedside. She reports that she does not want to stay here any longer. Advised patient to stay in hospital for further evaluation given her renal function and significant leukocytosis on labs. Discussed risks of leaving AMA including worsening renal function, electrolyte abnormalities, arrhythmia, volume overload and possible death. Patient understands but still wants to leave AMA. Advised patient to schedule appointment in [facility] ASAP. She reports that she needs 3 days advance notice for her transportation and will schedule when ready. The earliest appointment being 11/25. She is advised to hold her ACEi and diuretic.  Patient discussed with Dr. Hollie Salk who also advised her to hold her ACEi and diuretic. She will evaluate her further in clinic for her left hydroureter.   Harvie Heck Internal Medicine PGY-1 08/13/2019 2:30 PM

## 2019-08-13 NOTE — TOC Initial Note (Signed)
Transition of Care [facility]) - Initial/Assessment Note    Patient Details  Name: Susan Evans MRN: [id number] Date of Birth: 01-Jun-1956  [facility] CM/SW Contact:    Sharin Mons, RN Phone Number: 08/13/2019, 11:24 AM  Clinical Narrative:                 Presents with enteritis/ abd. pain. From home with boyfriend  Jenny Reichmann. States has supportive daughter that lives close by... checks on her daily. PTA independent with ADL's, no DME usage. Pt states @ d/c has transportation to home and no problems affording Rx meds.  No present needs identified per NCM. TOC team will continue to monitor and follow...  Expected Discharge Plan: Home/Self Care(Resides with boyfriend, Jenny Reichmann) Barriers to Discharge: Continued Medical Work up   Patient Goals and CMS Choice Patient states their goals for this hospitalization and ongoing recovery are:: to get better      Expected Discharge Plan and Services Expected Discharge Plan: Home/Self Care(Resides with boyfriend, Jenny Reichmann)   Discharge Planning Services: CM Consult   Living arrangements for the past 2 months: Single Family Home                                      Prior Living Arrangements/Services Living arrangements for the past 2 months: Single Family Home Lives with:: Significant Other Patient language and need for interpreter reviewed:: Yes Do you feel safe going back to the place where you live?: Yes      Need for Family Participation in Patient Care: No (Comment) Care giver support system in place?: Yes (comment)   Criminal Activity/Legal Involvement Pertinent to Current Situation/Hospitalization: No - Comment as needed  Activities of Daily Living Home Assistive Devices/Equipment: Dentures (specify type) ADL Screening (condition at time of admission) Patient's cognitive ability adequate to safely complete daily activities?: Yes Is the patient deaf or have difficulty hearing?: No Does the patient have  difficulty seeing, even when wearing glasses/contacts?: No Does the patient have difficulty concentrating, remembering, or making decisions?: No Patient able to express need for assistance with ADLs?: Yes Does the patient have difficulty dressing or bathing?: No Independently performs ADLs?: Yes (appropriate for developmental age) Does the patient have difficulty walking or climbing stairs?: No Weakness of Legs: Both Weakness of Arms/Hands: None  Permission Sought/Granted   Permission granted to share information with : Yes, Verbal Permission Granted  Share Information with NAME: Renold Genta (Daughter)[phone]           Emotional Assessment Appearance:: Appears stated age Attitude/Demeanor/Rapport: Engaged Affect (typically observed): Accepting Orientation: : Oriented to Self, Oriented to Place, Oriented to  Time, Oriented to Situation Alcohol / Substance Use: Not Applicable(States doen't smoke) Psych Involvement: No (comment)  Admission diagnosis:  Dehydration [E86.0] Colitis [K52.9] AKI (acute kidney injury) ([place]) [N17.9] Patient Active Problem List   Diagnosis Date Noted  . Enteritis 08/12/2019  . AKI (acute kidney injury) ([place]) 08/12/2019  . Tobacco use 06/28/2019  . 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 Allergic rhinitis 04/18/2014  . Osteoporosis 06/29/2013  . Health care maintenance 04/12/2013  . Hyperlipidemia   . Graves disease 04/10/2012  . 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 Essential hypertension 08/19/2006  . Type 2 diabetes mellitus with complication ([place]) 19/14/7829   PCP:  [name], MD Pharmacy:   [id number] - [place],  [place] - [address] [place] [address] Phone: [phone] Fax: [phone]     Social Determinants of Health (SDOH) Interventions    Readmission Risk Interventions No flowsheet data found.

## 2019-08-13 NOTE — Progress Notes (Signed)
Hypoglycemic Event  CBG: 50  Treatment: 4 oz juice/soda  Symptoms: None  Follow-up CBG: Time:848 CBG Result:43   Possible Reasons for Event: Unknown  Comments/MD notified:MD notified. Another 4 oz juice was given. CBG re-checked 49. Given 55ml of dextrose. CBG checked at 140.    Susan Evans

## 2019-08-13 NOTE — Progress Notes (Signed)
Pt. Asked to leave AMA. Paged MD. Pt insisted on leaving. AMA paper signed. IV taken out, intact. Pt belongings returned to her and pt. Escorted out of unit.

## 2019-08-13 NOTE — Consult Note (Signed)
[facility]  HISTORY AND PHYSICAL  Susan Evans is an 63 y.o. female.    Chief Complaint: abd pain  HPI: Pt is a 61F with a PMH sig for HTN, DM II, h/o ENT cancer 2011, h/o tobacco abuse, and hyperthyroidism who was admitted to [facility] for abd pain and possible gastroenteritis.  Consult for AKI on CKD.  I follow her in clinic, last seen 05/26/2019 when her Cr was 2.39.  Last UP/C around 5 g.  CKD d/t uncontrolled HTN and DM II.  Complete serologic workup performed was unrevealing and UAs have been bland.  Biopsy hasn't been deemed necessary.    She was admitted yesterday with abd pain.  Was constipated and initally drank a diabetic tea which caused her to have 4 BMs--> loose and watery.  CT scan showing colitis and enteritis, chronic pancreatitis, and new L hydroureter.  She received IVFs.  Cr on admission 3.54 --> 3.0 today.  In this setting we are asked to see.    Pt denies further abd pain at present.  No n/v, SOB, CP.  Was on lisinopril 20 mg daily; was advised to stop taking it sometime this fall.  No NSAIDs.    PMH: Past Medical History:  Diagnosis Date  . Diabetes mellitus   . Ganglion cyst    right wrist  . GERD (gastroesophageal reflux disease)   . History of radiation therapy 02/27/10 -04/13/10   left base of tongue, bilat neck  . Hyperlipidemia   . Hypertension   . Iron deficiency anemia   . 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 Menopause   . Thyroid disease    Hyperthyroidism  . Tobacco abuse    PT HAS QUIT   PSH: Past Surgical History:  Procedure Laterality Date  . NECK SURGERY    . REDUCTION MAMMAPLASTY      Past Medical History:  Diagnosis Date  . Diabetes mellitus   . Ganglion cyst    right wrist  . GERD (gastroesophageal reflux disease)   . History of radiation therapy 02/27/10 -04/13/10   left base of tongue, bilat neck  . Hyperlipidemia   . Hypertension   . Iron deficiency anemia   . Malignant neoplasm ([place]) 04/11    oropharyngeal carcinoma  . Menopause   . Thyroid disease    Hyperthyroidism  . Tobacco abuse    PT HAS QUIT    Medications:   Prior to Admission:  Medications Prior to Admission  Medication Sig Dispense Refill Last Dose  . atorvastatin (LIPITOR) 40 MG tablet TAKE 1 TABLET BY MOUTH EVERY DAY (Patient taking differently: Take 40 mg by mouth daily. ) 90 tablet 0 08/11/2019 at Unknown time  . calcium-vitamin D (CALCIUM 500+D HIGH POTENCY) 500-400 MG-UNIT tablet Take 1 tablet by mouth daily. 90 tablet 3 08/11/2019 at Unknown time  . cloNIDine (CATAPRES - DOSED IN MG/24 HR) 0.2 mg/24hr patch Place 1 patch (0.2 mg total) onto the skin every 7 (seven) days. 12 patch 3 08/08/2019  . CVS D3 50 MCG (2000 UT) CAPS TAKE 1 CAPSULE BY MOUTH EVERY DAY (Patient taking differently: Take 2,000 Units by mouth daily. ) 90 capsule 3 08/11/2019 at Unknown time  . furosemide (LASIX) 20 MG tablet Take 1 tablet (20 mg total) by mouth 2 (two) times daily. 180 tablet 2 08/11/2019 at Unknown time  . hydrALAZINE (APRESOLINE) 50 MG tablet Take 50 mg by mouth 2 (two) times daily.   08/11/2019 at Unknown time  .  insulin aspart (NOVOLOG FLEXPEN) 100 UNIT/ML FlexPen INJECT 7 UNITS WITH BREAKFAST, 10 WITH LUNCH, AND 8 WITH DINNER (Patient taking differently: Inject 7-10 Units into the skin See admin instructions. INJECT 7 UNITS WITH BREAKFAST, 10 WITH LUNCH, AND 8 WITH DINNER) 15 mL 6 08/11/2019 at Unknown time  . insulin glargine (LANTUS) 100 UNIT/ML injection INJECT 0.2ML INTO THE SKIN TWICE A DAY, 15 UNITS IN AM AND 15 UNITS IN EVENING (Patient taking differently: Inject 15 Units into the skin 2 (two) times daily. ) 20 mL 4 08/11/2019 at Unknown time  . lisinopril (ZESTRIL) 20 MG tablet Take 20 mg by mouth daily.   08/11/2019 at Unknown time  . loratadine (CLARITIN) 10 MG tablet Take 1 tablet (10 mg total) by mouth daily. 30 tablet 2 08/11/2019 at Unknown time  . omeprazole (PRILOSEC) 20 MG capsule Take 1 capsule (20 mg  total) by mouth daily. 180 capsule 1 08/11/2019 at Unknown time  . Polyethyl Glycol-Propyl Glycol (SYSTANE OP) Apply 1 drop to eye daily as needed (for dry eyes).   08/11/2019 at Unknown time  . verapamil (CALAN-SR) 180 MG CR tablet Take 360 mg by mouth at bedtime.    Past Week at Unknown time  . ACCU-CHEK FASTCLIX LANCETS MISC 5 (five) times daily Diagnosis code E11.9 204 each 8   . Blood Glucose Monitoring Suppl (ACCU-CHEK AVIVA PLUS) w/Device KIT Use to test blood glucose  3 times daily 1 kit 0   . glucose blood (ACCU-CHEK GUIDE) test strip 5 (five) times daily Diagnosis code E11.9 150 each 12   . Insulin Pen Needle (B-D UF III MINI PEN NEEDLES) 31G X 5 MM MISC USE TO INJECT NOVOLOG 3 TIMES A DAY. DIAGNOSIS CODE E11.9, Z79.4 100 each 8   . Insulin Syringe-Needle U-100 31G X 15/64" 0.3 ML MISC USE TO INJECT LANTUS INSULIN TWO TIMES A DAY 180 each 3   . sitaGLIPtin-metformin (JANUMET) 50-1000 MG tablet Take 1 tablet by mouth 2 (two) times daily with a meal. 180 tablet 2    Scheduled: . aspirin EC  81 mg Oral Daily  . atorvastatin  40 mg Oral Daily  . calcium-vitamin D  1 tablet Oral QAC supper  . cholecalciferol  2,000 Units Oral Daily  . cloNIDine  0.2 mg Transdermal Q7 days  . enoxaparin (LOVENOX) injection  30 mg Subcutaneous Daily  . hydrALAZINE  50 mg Oral Q8H  . insulin aspart  0-9 Units Subcutaneous TID WC  . loratadine  10 mg Oral Daily  . verapamil  360 mg Oral Daily    Medications Prior to Admission  Medication Sig Dispense Refill  . atorvastatin (LIPITOR) 40 MG tablet TAKE 1 TABLET BY MOUTH EVERY DAY (Patient taking differently: Take 40 mg by mouth daily. ) 90 tablet 0  . calcium-vitamin D (CALCIUM 500+D HIGH POTENCY) 500-400 MG-UNIT tablet Take 1 tablet by mouth daily. 90 tablet 3  . cloNIDine (CATAPRES - DOSED IN MG/24 HR) 0.2 mg/24hr patch Place 1 patch (0.2 mg total) onto the skin every 7 (seven) days. 12 patch 3  . CVS D3 50 MCG (2000 UT) CAPS TAKE 1 CAPSULE BY MOUTH  EVERY DAY (Patient taking differently: Take 2,000 Units by mouth daily. ) 90 capsule 3  . furosemide (LASIX) 20 MG tablet Take 1 tablet (20 mg total) by mouth 2 (two) times daily. 180 tablet 2  . hydrALAZINE (APRESOLINE) 50 MG tablet Take 50 mg by mouth 2 (two) times daily.    . insulin aspart (NOVOLOG  FLEXPEN) 100 UNIT/ML FlexPen INJECT 7 UNITS WITH BREAKFAST, 10 WITH LUNCH, AND 8 WITH DINNER (Patient taking differently: Inject 7-10 Units into the skin See admin instructions. INJECT 7 UNITS WITH BREAKFAST, 10 WITH LUNCH, AND 8 WITH DINNER) 15 mL 6  . insulin glargine (LANTUS) 100 UNIT/ML injection INJECT 0.2ML INTO THE SKIN TWICE A DAY, 15 UNITS IN AM AND 15 UNITS IN EVENING (Patient taking differently: Inject 15 Units into the skin 2 (two) times daily. ) 20 mL 4  . lisinopril (ZESTRIL) 20 MG tablet Take 20 mg by mouth daily.    Marland Kitchen loratadine (CLARITIN) 10 MG tablet Take 1 tablet (10 mg total) by mouth daily. 30 tablet 2  . omeprazole (PRILOSEC) 20 MG capsule Take 1 capsule (20 mg total) by mouth daily. 180 capsule 1  . Polyethyl Glycol-Propyl Glycol (SYSTANE OP) Apply 1 drop to eye daily as needed (for dry eyes).    . verapamil (CALAN-SR) 180 MG CR tablet Take 360 mg by mouth at bedtime.     Marland Kitchen ACCU-CHEK FASTCLIX LANCETS MISC 5 (five) times daily Diagnosis code E11.9 204 each 8  . Blood Glucose Monitoring Suppl (ACCU-CHEK AVIVA PLUS) w/Device KIT Use to test blood glucose  3 times daily 1 kit 0  . glucose blood (ACCU-CHEK GUIDE) test strip 5 (five) times daily Diagnosis code E11.9 150 each 12  . Insulin Pen Needle (B-D UF III MINI PEN NEEDLES) 31G X 5 MM MISC USE TO INJECT NOVOLOG 3 TIMES A DAY. DIAGNOSIS CODE E11.9, Z79.4 100 each 8  . Insulin Syringe-Needle U-100 31G X 15/64" 0.3 ML MISC USE TO INJECT LANTUS INSULIN TWO TIMES A DAY 180 each 3  . sitaGLIPtin-metformin (JANUMET) 50-1000 MG tablet Take 1 tablet by mouth 2 (two) times daily with a meal. 180 tablet 2    ALLERGIES:   Allergies   Allergen Reactions  . Hydrochlorothiazide Itching and Other (See Comments)    Hyponatremia   . Penicillins Other (See Comments)    "takes all of her hair out" .Has patient had a PCN reaction causing immediate rash, facial/tongue/throat swelling, SOB or lightheadedness with hypotension: No Has patient had a PCN reaction causing severe rash involving mucus membranes or skin necrosis: No Has patient had a PCN reaction that required hospitalization No Has patient had a PCN reaction occurring within the last 10 years: No If all of the above answers are "NO", then may proceed with Cephalosporin use.     FAM HX: Family History  Problem Relation Age of Onset  . Stroke Father   . Cancer Father        unknown ca  . Hypertension Mother   . Diabetes Mother   . Chronic Renal Failure Sister   . Chronic Renal Failure Brother   . Cancer Paternal Aunt        colon ca  . Breast cancer Neg Hx     Social History:   reports that she quit smoking about 10 years ago. She smoked 0.00 packs per day. She has never used smokeless tobacco. She reports that she does not drink alcohol or use drugs.  ROS: ROS: all other systems reviewed and are negative  Blood pressure (!) 178/92, pulse 98, temperature 98.8 F (37.1 C), temperature source Oral, resp. rate 18, height 5' 2" (1.575 m), weight 54.4 kg, SpO2 100 %. PHYSICAL EXAM: Physical Exam GEN older woman, NAD, sitting in bed HEENT edentulous NECK flat neck veins PULM clear bilaterally no c/w/r CV RRR no m/r/g ABD  soft, mildly distended, nontender EXT no LE edema NEURO AAO x 3 nonfocal SKIN no rashes MSK no effusions  Results for orders placed or performed during the hospital encounter of 08/12/19 (from the past 48 hour(s))  CBG monitoring, ED     Status: Abnormal   Collection Time: 08/12/19 12:58 AM  Result Value Ref Range   Glucose-Capillary 234 (H) 70 - 99 mg/dL  CBC with Differential     Status: Abnormal   Collection Time: 08/12/19   1:06 AM  Result Value Ref Range   WBC 24.9 (H) 4.0 - 10.5 K/uL   RBC 4.51 3.87 - 5.11 MIL/uL   Hemoglobin 10.4 (L) 12.0 - 15.0 g/dL   HCT 34.3 (L) 36.0 - 46.0 %   MCV 76.1 (L) 80.0 - 100.0 fL   MCH 23.1 (L) 26.0 - 34.0 pg   MCHC 30.3 30.0 - 36.0 g/dL   RDW 16.0 (H) 11.5 - 15.5 %   Platelets 343 150 - 400 K/uL    Comment: REPEATED TO VERIFY   nRBC 0.0 0.0 - 0.2 %   Neutrophils Relative % 91 %   Neutro Abs 22.6 (H) 1.7 - 7.7 K/uL   Lymphocytes Relative 2 %   Lymphs Abs 0.5 (L) 0.7 - 4.0 K/uL   Monocytes Relative 7 %   Monocytes Absolute 1.7 (H) 0.1 - 1.0 K/uL   Eosinophils Relative 0 %   Eosinophils Absolute 0.0 0.0 - 0.5 K/uL   Basophils Relative 0 %   Basophils Absolute 0.1 0.0 - 0.1 K/uL   Immature Granulocytes 0 %   Abs Immature Granulocytes 0.11 (H) 0.00 - 0.07 K/uL    Comment: Performed at [facility], [address] [place], [place] [address]  Comprehensive metabolic panel     Status: Abnormal   Collection Time: 08/12/19  1:06 AM  Result Value Ref Range   Sodium 137 135 - 145 mmol/L   Potassium 3.1 (L) 3.5 - 5.1 mmol/L   Chloride 101 98 - 111 mmol/L   CO2 19 (L) 22 - 32 mmol/L   Glucose, Bld 240 (H) 70 - 99 mg/dL   BUN 26 (H) 8 - 23 mg/dL   Creatinine, Ser 3.54 (H) 0.44 - 1.00 mg/dL   Calcium 8.1 (L) 8.9 - 10.3 mg/dL   Total Protein 5.4 (L) 6.5 - 8.1 g/dL   Albumin 2.0 (L) 3.5 - 5.0 g/dL   AST 27 15 - 41 U/L   ALT 17 0 - 44 U/L   Alkaline Phosphatase 114 38 - 126 U/L   Total Bilirubin 0.3 0.3 - 1.2 mg/dL   GFR calc non Af Amer 13 (L) >60 mL/min   GFR calc Af Amer 15 (L) >60 mL/min   Anion gap 17 (H) 5 - 15    Comment: Performed at [facility], [place] [address] [place], [place] [address]  Lipase, blood     Status: None   Collection Time: 08/12/19  1:06 AM  Result Value Ref Range   Lipase 13 11 - 51 U/L    Comment: Performed at [facility] [address], [place], [place] [address]  Lactic acid, plasma     Status: Abnormal   Collection  Time: 08/12/19  3:08 AM  Result Value Ref Range   Lactic Acid, Venous 4.6 (HH) 0.5 - 1.9 mmol/L    Comment: CRITICAL RESULT CALLED TO, READ BACK BY AND VERIFIED WITH: Verdene Rio 08/12/19 0327 WAYK Performed at [facility],  [address] [place], [place] [address]   Protime-INR     Status: None   Collection Time: 08/12/19  3:31 AM  Result Value Ref Range   Prothrombin Time 12.7 11.4 - 15.2 seconds   INR 1.0 0.8 - 1.2    Comment: (NOTE) INR goal varies based on device and disease states. Performed at [facility], [place] [address], [place], [place] [address]   Blood Culture (routine x 2)     Status: None (Preliminary result)   Collection Time: 08/12/19  3:45 AM   Specimen: BLOOD RIGHT ARM  Result Value Ref Range   Specimen Description BLOOD RIGHT ARM    Special Requests      BOTTLES DRAWN AEROBIC AND ANAEROBIC Blood Culture adequate volume   Culture      NO GROWTH 1 DAY Performed at [facility], [place] [address] [place], [place] [address]    Report Status PENDING   Blood Culture (routine x 2)     Status: None (Preliminary result)   Collection Time: 08/12/19  4:09 AM   Specimen: BLOOD RIGHT HAND  Result Value Ref Range   Specimen Description BLOOD RIGHT HAND    Special Requests      BOTTLES DRAWN AEROBIC ONLY Blood Culture adequate volume   Culture      NO GROWTH 1 DAY Performed at [facility], [place] [address] [place], [place] [address]    Report Status PENDING   Lactic acid, plasma     Status: Abnormal   Collection Time: 08/12/19  4:43 AM  Result Value Ref Range   Lactic Acid, Venous 4.3 (HH) 0.5 - 1.9 mmol/L    Comment: CRITICAL VALUE NOTED.  VALUE IS CONSISTENT WITH PREVIOUSLY REPORTED AND CALLED VALUE. Performed at [facility], [place] [address], [place], [place] [address]   Urine culture     Status: None   Collection Time: 08/12/19  4:55 AM   Specimen: Urine, Clean Catch  Result Value Ref Range   Specimen Description URINE, CLEAN  CATCH    Special Requests NONE    Culture      NO GROWTH Performed at [facility], [place] [address] [place],  [address]    Report Status 08/13/2019 FINAL   Urinalysis, Routine w reflex microscopic     Status: Abnormal   Collection Time: 08/12/19  4:55 AM  Result Value Ref Range   Color, Urine YELLOW YELLOW   APPearance CLEAR CLEAR   Specific Gravity, Urine 1.006 1.005 - 1.030   pH 7.0 5.0 - 8.0   Glucose, UA 50 (A) NEGATIVE mg/dL   Hgb urine dipstick NEGATIVE NEGATIVE   Bilirubin Urine NEGATIVE NEGATIVE   Ketones, ur NEGATIVE NEGATIVE mg/dL   Protein, ur 100 (A) NEGATIVE mg/dL   Nitrite NEGATIVE NEGATIVE   Leukocytes,Ua NEGATIVE NEGATIVE   RBC / HPF 0-5 0 - 5 RBC/hpf   WBC, UA 0-5 0 - 5 WBC/hpf   Bacteria, UA RARE (A) NONE SEEN    Comment: Performed at [facility] [address], [place], [place] [address]  SARS CORONAVIRUS 2 (TAT 6-24 HRS) Nasopharyngeal Nasopharyngeal Swab     Status: None   Collection Time: 08/12/19  6:14 AM   Specimen: Nasopharyngeal Swab  Result Value Ref Range   SARS Coronavirus 2 NEGATIVE NEGATIVE    Comment: (NOTE) SARS-CoV-2 target nucleic acids are NOT DETECTED. The SARS-CoV-2 RNA is generally detectable in upper and lower respiratory specimens  during the acute phase of infection. Negative results do not preclude SARS-CoV-2 infection, do not rule out co-infections with other pathogens, and should not be used as the sole basis for treatment or other patient management decisions. Negative results must be combined with clinical observations, patient history, and epidemiological information. The expected result is Negative. Fact Sheet for Patients: SugarRoll.be Fact Sheet for Healthcare Providers: https://www.woods-mathews.com/ This test is not yet approved or cleared by the [place] FDA and  has been authorized for detection and/or diagnosis of SARS-CoV-2 by FDA under an Emergency  Use Authorization (EUA). This EUA will remain  in effect (meaning this test can be used) for the duration of the COVID-19 declaration under Section 56 4(b)(1) of the Act, 21 U.S.C. section 360bbb-3(b)(1), unless the authorization is terminated or revoked sooner. Performed at [facility], [place] [address], [place], [place] [address]   Lactic acid, plasma     Status: Abnormal   Collection Time: 08/12/19  7:01 AM  Result Value Ref Range   Lactic Acid, Venous 2.4 (HH) 0.5 - 1.9 mmol/L    Comment: CRITICAL VALUE NOTED.  VALUE IS CONSISTENT WITH PREVIOUSLY REPORTED AND CALLED VALUE. Performed at [facility], [place] [address] [place], [place] [address]   Basic metabolic panel     Status: Abnormal   Collection Time: 08/12/19  7:28 AM  Result Value Ref Range   Sodium 139 135 - 145 mmol/L   Potassium 3.6 3.5 - 5.1 mmol/L   Chloride 102 98 - 111 mmol/L   CO2 23 22 - 32 mmol/L   Glucose, Bld 126 (H) 70 - 99 mg/dL   BUN 24 (H) 8 - 23 mg/dL   Creatinine, Ser 3.07 (H) 0.44 - 1.00 mg/dL   Calcium 7.6 (L) 8.9 - 10.3 mg/dL   GFR calc non Af Amer 15 (L) >60 mL/min   GFR calc Af Amer 18 (L) >60 mL/min   Anion gap 14 5 - 15    Comment: Performed at [facility] [address] [place], [place] [address]  CBG monitoring, ED     Status: Abnormal   Collection Time: 08/12/19  8:07 AM  Result Value Ref Range   Glucose-Capillary 110 (H) 70 - 99 mg/dL  Lactic acid, plasma     Status: None   Collection Time: 08/12/19 10:34 AM  Result Value Ref Range   Lactic Acid, Venous 1.9 0.5 - 1.9 mmol/L    Comment: Performed at [facility], [place] [address] [place], [place] [address]  HIV Antibody (routine testing w rflx)     Status: None   Collection Time: 08/12/19 10:34 AM  Result Value Ref Range   HIV Screen 4th Generation wRfx NON REACTIVE NON REACTIVE    Comment: Performed at [facility] [address], [place], [place] [address]  CBG monitoring, ED     Status: Abnormal    Collection Time: 08/12/19 12:09 PM  Result Value Ref Range   Glucose-Capillary 41 (LL) 70 - 99 mg/dL   Comment 1 Notify RN   CBG monitoring, ED     Status: Abnormal   Collection Time: 08/12/19 12:23 PM  Result Value Ref Range   Glucose-Capillary 51 (L) 70 - 99 mg/dL  CBG monitoring, ED     Status: Abnormal   Collection Time: 08/12/19  1:00 PM  Result Value Ref Range   Glucose-Capillary 56 (L) 70 - 99 mg/dL  CBG monitoring, ED  Status: Abnormal   Collection Time: 08/12/19  2:18 PM  Result Value Ref Range   Glucose-Capillary 173 (H) 70 - 99 mg/dL  CBG monitoring, ED     Status: Abnormal   Collection Time: 08/12/19  5:32 PM  Result Value Ref Range   Glucose-Capillary 145 (H) 70 - 99 mg/dL  CBG monitoring, ED     Status: Abnormal   Collection Time: 08/12/19  9:35 PM  Result Value Ref Range   Glucose-Capillary 37 (LL) 70 - 99 mg/dL   Comment 1 Notify RN    Comment 2 Document in Chart   CBG monitoring, ED     Status: Abnormal   Collection Time: 08/12/19 10:04 PM  Result Value Ref Range   Glucose-Capillary 166 (H) 70 - 99 mg/dL  Glucose, capillary     Status: Abnormal   Collection Time: 08/12/19 11:07 PM  Result Value Ref Range   Glucose-Capillary 162 (H) 70 - 99 mg/dL  Basic metabolic panel     Status: Abnormal   Collection Time: 08/13/19  2:10 AM  Result Value Ref Range   Sodium 134 (L) 135 - 145 mmol/L   Potassium 4.0 3.5 - 5.1 mmol/L   Chloride 101 98 - 111 mmol/L   CO2 23 22 - 32 mmol/L   Glucose, Bld 145 (H) 70 - 99 mg/dL   BUN 25 (H) 8 - 23 mg/dL   Creatinine, Ser 3.15 (H) 0.44 - 1.00 mg/dL   Calcium 7.6 (L) 8.9 - 10.3 mg/dL   GFR calc non Af Amer 15 (L) >60 mL/min   GFR calc Af Amer 17 (L) >60 mL/min   Anion gap 10 5 - 15    Comment: Performed at [facility], [address] [place], [place] [address]  CBC     Status: Abnormal   Collection Time: 08/13/19  2:10 AM  Result Value Ref Range   WBC 23.3 (H) 4.0 - 10.5 K/uL   RBC 3.49 (L) 3.87 - 5.11 MIL/uL    Hemoglobin 8.1 (L) 12.0 - 15.0 g/dL    Comment: Reticulocyte Hemoglobin testing may be clinically indicated, consider ordering this additional test [id number] REPEATED TO VERIFY    HCT 25.9 (L) 36.0 - 46.0 %   MCV 74.2 (L) 80.0 - 100.0 fL   MCH 23.2 (L) 26.0 - 34.0 pg   MCHC 31.3 30.0 - 36.0 g/dL   RDW 16.2 (H) 11.5 - 15.5 %   Platelets 308 150 - 400 K/uL   nRBC 0.0 0.0 - 0.2 %    Comment: Performed at [facility], [place] [address], [place], [place] [address]  Glucose, capillary     Status: Abnormal   Collection Time: 08/13/19  3:17 AM  Result Value Ref Range   Glucose-Capillary 140 (H) 70 - 99 mg/dL  Ferritin     Status: Abnormal   Collection Time: 08/13/19  7:15 AM  Result Value Ref Range   Ferritin 483 (H) 11 - 307 ng/mL    Comment: Performed at [facility], [place] [address], [place], [place] [address]  Iron and TIBC     Status: Abnormal   Collection Time: 08/13/19  7:15 AM  Result Value Ref Range   Iron 45 28 - 170 ug/dL   TIBC 144 (L) 250 - 450 ug/dL   Saturation Ratios 31 10.4 - 31.8 %   UIBC 99 ug/dL    Comment: Performed at [facility], [place] [address], [place], [place] [address]  Reticulocytes     Status: Abnormal   Collection Time: 08/13/19  7:15 AM  Result Value Ref Range   Retic Ct Pct 2.2 0.4 - 3.1 %   RBC. 3.75 (L) 3.87 - 5.11 MIL/uL   Retic Count, Absolute 81.0 19.0 - 186.0 K/uL   Immature Retic Fract 12.7 2.3 - 15.9 %    Comment: Performed at [facility] [address], [place], [place] [address]  Glucose, capillary     Status: Abnormal   Collection Time: 08/13/19  8:13 AM  Result Value Ref Range   Glucose-Capillary 50 (L) 70 - 99 mg/dL  Glucose, capillary     Status: Abnormal   Collection Time: 08/13/19  8:49 AM  Result Value Ref Range   Glucose-Capillary 43 (LL) 70 - 99 mg/dL   Comment 1 Notify RN   Glucose, capillary     Status: Abnormal   Collection Time: 08/13/19  8:57 AM  Result Value Ref Range   Glucose-Capillary 49 (L)  70 - 99 mg/dL  Glucose, capillary     Status: Abnormal   Collection Time: 08/13/19  9:27 AM  Result Value Ref Range   Glucose-Capillary 140 (H) 70 - 99 mg/dL  Glucose, capillary     Status: Abnormal   Collection Time: 08/13/19 12:13 PM  Result Value Ref Range   Glucose-Capillary 109 (H) 70 - 99 mg/dL    Ct Abdomen Pelvis Wo Contrast  Result Date: 08/12/2019 CLINICAL DATA:  Constipation for 5 days EXAM: CT ABDOMEN AND PELVIS WITHOUT CONTRAST TECHNIQUE: Multidetector CT imaging of the abdomen and pelvis was performed following the standard protocol without IV contrast. COMPARISON:  None similar FINDINGS: Lower chest: Left-sided breast tissue has a similar appearance to 2011 chest PET-CT. Asymmetry is attributed to partial coverage. Hepatobiliary: 18 mm low-density in the right liver is cystic density.Full gallbladder without calcified stone or definite inflammation. Pancreas: Scattered coarse calcifications. There is likely a segment of main duct dilatation proximal to a body stone, further assessment limited without contrast. Spleen: Unremarkable. Adrenals/Urinary Tract: Negative adrenals. The left upper ureter is dilated above the pelvis. No evident urinary stone. Bilateral renal cystic densities, upper pole on the left and lower pole on the right. Unremarkable bladder. Stomach/Bowel: There is apparent small bowel wall thickening proximally with mesenteric edema. There may also be fat edema around the left colon. Normal appendix. Thicker appearance of the greater curvature stomach wall which on coronal reformats is likely from incomplete distention given the apparent removal pattern that is preserved. Vascular/Lymphatic: No acute vascular abnormality. Moderate atheromatous calcifications. No mass or adenopathy. Reproductive:Multiple calcified/hyalinized uterine fibroids. Other: No ascites or pneumoperitoneum. Musculoskeletal: No acute abnormalities. IMPRESSION: 1. Findings of enteritis and possibly  mild colitis. History of constipation with no bowel obstruction or abnormal stool retention. 2. 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 Consider postcontrast imaging. 3. Asymmetric thick appearance of the greater curvature stomach likely from under distension. If no recent EGD or outside comparison abdominal CT an upper GI would be reassuring. 4. Upper left hydroureter without visible obstructive process. Electronically Signed   By: Monte Fantasia M.D.   On: 08/12/2019 05:53   Dg Chest Port 1 View  Result Date: 08/12/2019 CLINICAL DATA:  63 year old female with sepsis. EXAM: PORTABLE CHEST 1 VIEW COMPARISON:  Chest radiographs 08/21/2012.  Chest CT 10/26/2010. FINDINGS: Portable AP semi upright view at 0335 hours. Lung volumes and mediastinal contours are stable and within normal limits. Eventration of the diaphragm, normal variant. Visualized tracheal  air column is within normal limits. Allowing for portable technique the lungs are clear. No acute osseous abnormality identified. Paucity of bowel gas in the upper abdomen. IMPRESSION: No acute cardiopulmonary abnormality. Electronically Signed   By: Genevie Ann M.D.   On: 08/12/2019 03:45    Assessment/Plan 1.  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  There is also a question of new hydroureter on CT scan.  Notified by primary team that pt is leaving AMA.  Recs would be to avoid diuretics and ACEi until she sees me in clinic.  Will need to work up hydroureter- unclear if this is is responsible for AKI on CKD as of yet which would be compelling reason to stay.  Fortunately Cr is improved 3.54--> 3.05 with IVFs and there is no indication for RRT.  2.  L hydroureter: would recommend urology consultation.  She has had no real hematuria on any of her UAs; unclear etiology at present.    3.  Nephrotic range proteinuria: no evidence of nephrotic syndrome, exacerbated by uncontrolled HTN and DM.  4.   Enteritis/ colitis: per primary, got zithromax  UPTON, ELIZABETH 08/13/2019, 2:14 PM

## 2019-08-13 NOTE — Progress Notes (Signed)
Subjective: HD#1. Ms. Verdi was evaluated on rounds this morning. She is resting comfortably in bed and eating breakfast. She does not have any acute concerns or complaints at this time. She denies any abdominal pain. She has not had a bowel movement after her initial bowel movements in the ED. However, she reports that she has not urinated since being here.   She had one episode of hypoglycemia overnight and one this morning for which she was given D50. Patient reports that she is only taking Lantus 5U at night and Novolog 3U tid with meals at home.  Objective:  Vital signs in last 24 hours: Vitals:   08/12/19 2130 08/12/19 2306 08/13/19 0530 08/13/19 0852  BP: (!) 171/133 (!) 176/64 (!) 189/76 (!) 198/79  Pulse: 95 91 98   Resp: (!) 24 16 16 17   Temp:  98.6 F (37 C) 98.5 F (36.9 C) 97.9 F (36.6 C)  TempSrc:  Oral Oral Oral  SpO2: 99% 95% 98% 100%  Weight:      Height:       Physical Exam Vitals signs and nursing note reviewed.  Constitutional:      General: She is not in acute distress.    Appearance: Normal appearance. She is not ill-appearing.  Cardiovascular:     Rate and Rhythm: Normal rate and regular rhythm.     Pulses: Normal pulses.     Heart sounds: Normal heart sounds. No murmur. No friction rub. No gallop.   Pulmonary:     Effort: Pulmonary effort is normal. No respiratory distress.     Breath sounds: Normal breath sounds. No wheezing.  Abdominal:     General: Bowel sounds are normal. There is no distension.     Palpations: Abdomen is soft.     Tenderness: There is no abdominal tenderness. There is no guarding.     Hernia: No hernia is present.  Neurological:     General: No focal deficit present.     Mental Status: She is alert and oriented to person, place, and time. Mental status is at baseline.    Assessment/Plan:  Ms. 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 CT findings of enteritis with mild colitis in setting of lactic acidosis and leukocytosis with left shift.   Enteritis/ Colitis: Ms. Vanantwerp is tolerating PO this morning without any abdominal pain and denies any further episodes of diarrhea since being admitted. She received cefepime and flagyl in the ED and has received azithromycin 1g for empiric treatment of infectious colitis. She continues to have elevated WBC at 23 today but continues to be afebrile and hemodynamically stable.  - Continue to monitor  - Hold off abx for now - CBC daily  Hypoglycemia: Patient had two episodes of hypoglycemia overnight requiring D50 with improvement in CBGs. Per recent office visit chart, patient on Lantus 15U bid and novolog 7U breakfast, 10U lunch and 8U dinner. However, she reports that she only takes Lantus 5U at night and Novolog 3U tid. Questionable whether she was instructed by physician to do this or self titrated due to hypoglycemic episodes. - Holding insulin - CBG q4h   AKI on CKD:  Patient with history of progressively worsening CKD. This morning, patient reported no urine output since arrival and denies feeling to urge. CT abdomen with left upper ureter dilation w/o  visible obstruction. On bladder scan, patient had 115cc of urine. Will consider foley if patient retaining.  - LR 75 cc/hr - Urine sodium and creatinine - Nephrology consulted, appreciate recommendations - Strict I&Os  Acute on chronic anemia: Hb 10.4 > 8.1 this morning. Potentially secondary to dilution from IVF's. However, will repeat CBC this afternoon and continue to monitor. - FOBT pending - CBC this PM - Continue cardiac monitoring  Hypertension: Patient with history of hypertension on clonidine patch, hydralazine, labetalol and verapamil at home. Patient to be continued on home medications for BP control. Her SBP >190 this morning. Improved to  178/92 after verapamil and labetalol.  - Continue clonidine patch 0.2mg  q7d - Hydralazone 50mg  q8h - Verapamil 360mg  qd - Labetalol 5mg  q6h prn SBP >180  FEN: LR 75 cc/hr Code: Full VTE Prophylaxis: Lovenox    Dispo: Anticipated discharge pending clinical improvement.   Harvie Heck, MD  Internal Medicine, PGY-1 08/13/2019, 11:13 AM Pager: [phone]

## 2019-08-15 NOTE — Discharge Summary (Signed)
Name: Susan Evans MRN: [id number] DOB: Feb 13, 1956 63 y.o. PCP: [name], MD  Date of Admission: 08/12/2019 12:47 AM Date of Discharge: 08/13/2019 Attending Physician: Dr. Velna Ochs  Discharge Diagnosis: 1. Enteritis/colitis 2. AKI on CKD 3. Acute on chronic anemia 4. Hypoglycemia  Discharge Medications: Allergies as of 08/13/2019      Reactions   Hydrochlorothiazide Itching, Other (See Comments)   Hyponatremia   Penicillins Other (See Comments)   "takes all of her hair out" .Has patient had a PCN reaction causing immediate rash, facial/tongue/throat swelling, SOB or lightheadedness with hypotension: No Has patient had a PCN reaction causing severe rash involving mucus membranes or skin necrosis: No Has patient had a PCN reaction that required hospitalization No Has patient had a PCN reaction occurring within the last 10 years: No If all of the above answers are "NO", then may proceed with Cephalosporin use.      Medication List    ASK your doctor about these medications   Accu-Chek Aviva Plus w/Device Kit Use to test blood glucose  3 times daily   Accu-Chek FastClix Lancets Misc 5 (five) times daily Diagnosis code E11.9   atorvastatin 40 MG tablet Commonly known as: LIPITOR TAKE 1 TABLET BY MOUTH EVERY DAY   calcium-vitamin D 500-400 MG-UNIT tablet Commonly known as: Calcium 500+D High Potency Take 1 tablet by mouth daily.   cloNIDine 0.2 mg/24hr patch Commonly known as: CATAPRES - Dosed in mg/24 hr Place 1 patch (0.2 mg total) onto the skin every 7 (seven) days.   [facility] D3 50 MCG (2000 UT) Caps Generic drug: Cholecalciferol TAKE 1 CAPSULE BY MOUTH EVERY DAY   furosemide 20 MG tablet Commonly known as: LASIX Take 1 tablet (20 mg total) by mouth 2 (two) times daily.   glucose blood test strip Commonly known as: Accu-Chek Guide 5 (five) times daily Diagnosis code E11.9   hydrALAZINE 50 MG tablet Commonly known as: APRESOLINE Take 50  mg by mouth 2 (two) times daily.   insulin aspart 100 UNIT/ML FlexPen Commonly known as: NovoLOG FlexPen INJECT 7 UNITS WITH BREAKFAST, 10 WITH LUNCH, AND 8 WITH DINNER   insulin glargine 100 UNIT/ML injection Commonly known as: Lantus INJECT 0.2ML INTO THE SKIN TWICE A DAY, 15 UNITS IN AM AND 15 UNITS IN EVENING   Insulin Pen Needle 31G X 5 MM Misc Commonly known as: B-D UF III MINI PEN NEEDLES USE TO INJECT NOVOLOG 3 TIMES A DAY. DIAGNOSIS CODE E11.9, Z79.4   Insulin Syringe-Needle U-100 31G X 15/64" 0.3 ML Misc USE TO INJECT LANTUS INSULIN TWO TIMES A DAY   lisinopril 20 MG tablet Commonly known as: ZESTRIL Take 20 mg by mouth daily. Ask about: Which instructions should I use?   loratadine 10 MG tablet Commonly known as: Claritin Take 1 tablet (10 mg total) by mouth daily.   omeprazole 20 MG capsule Commonly known as: PRILOSEC Take 1 capsule (20 mg total) by mouth daily.   sitaGLIPtin-metformin 50-1000 MG tablet Commonly known as: Janumet Take 1 tablet by mouth 2 (two) times daily with a meal.   SYSTANE OP Apply 1 drop to eye daily as needed (for dry eyes).   verapamil 180 MG CR tablet Commonly known as: CALAN-SR Take 360 mg by mouth at bedtime. Ask about: Which instructions should I use?       Disposition and follow-up:   Susan Evans was discharged from [facility] in Stable condition.  At the hospital follow up visit  please address:  1.  Enteritis/colitis: Patient presented with abdominal pain with multiple watery stools that resolved on admission. She received cefepime, flagyl and azithromycin for empiric treatment. She remained hemodynamically stable and afebrile but elevated WBC. Please f/u on symptoms and CBC.   AKI on CKD: CT Abdomen with left upper ureter dilation w/o visible obstruction; followed by nephrology - please repeat BMP and make sure she follows up with her nephrologist, Dr. Hollie Salk  Acute on chronic anemia: Please  repeat CBC, obtain FOBT  Hypoglycemia: Patient had two episodes of hypoglycemia during admission. Please clarify her home insulin regimen.   2.  Labs / imaging needed at time of follow-up: CBC, BMP  3.  Pending labs/ test needing follow-up: none   Follow-up Appointments: Follow-up Information    [facility]. Schedule an appointment as soon as possible for a visit in 1 day(s).   Contact information: [address] [place], [place] [facility]. Schedule an appointment as soon as possible for a visit in 1 week(s).   Specialty: Nephrology Contact information: [address] [place] [address] [place] Hospital Course by problem list: 1. Enteritis/colitis: Susan Evans presented to the ED with acute onset abdominal pain and diarrhea. She developed constipation ~3 days ago and drank diabetic tea after which she developed severe abdominal pain. She developed diarrhea with four watery bowel movements resulting in improvement of her pain. She denied any blood in her stool. She had elevated white count and renal dysfunction with elevation in lactate acid in the ED. CT abdomen showed enteritis and mild colitis, chronic pancreatitis and new left hydroureter. She received one dose of cefepime, flagyl and azithromycin. Her pain resolved and she did not have any more bowel movements during her admission. She did have persistent leukocytosis but remained afebrile and hemodynamically stable during this admission.   2. AKI on CKD III: Patient with history of progressively worsening CKD. Patient presented with Cr of 3.54 on admission which improved to 3.0 with IV fluids. However, she did have a new left hydroureter without any obvious signs of obstruction. Also had nephrotic range proteinuria without evidence of nephrotic syndrome. Nephrology was consulted. Recommendations to avoid  diuretics and ACE inhibitors. She will follow up with Dr. Hollie Salk as outpatient.   3. Acute on chronic anemia: 2g drop in Hb 10.4>8.1. Iron studies with Fe 45, TIBC 144, Ferritin 483, likely anemia of chronic disease. Suspect could be secondary to dilution from fluids; however, remainder of cell lines not affected. Patient would need repeat CBC and FOBT on follow up.   4. Hypoglycemia:  Patient had two episodes of hypoglycemia overnight requiring D50 with improvement in CBGs. Per recent office visit chart, patient on Lantus 15U bid and novolog 7U breakfast, 10U lunch and 8U dinner. However, she reports that she only takes Lantus 5U at night and Novolog 3U tid. Questionable whether she was instructed by physician to do this or self titrated due to hypoglycemic episodes. She will need her insulin adjusted.   Discharge Vitals:   BP (!) 178/92   Pulse 98   Temp 98.8 F (37.1 C) (Oral)   Resp 18   Ht _0  (1.575 m)   Wt 54.4 kg   SpO2 100%   BMI 21.95 kg/m   Pertinent Labs, Studies, and Procedures:  CBC Latest Ref Rng & Units 08/13/2019  08/12/2019 10/14/2015  WBC 4.0 - 10.5 K/uL 23.3(H) 24.9(H) 7.5  Hemoglobin 12.0 - 15.0 g/dL 8.1(L) 10.4(L) 11.9(L)  Hematocrit 36.0 - 46.0 % 25.9(L) 34.3(L) 38.6  Platelets 150 - 400 K/uL 308 343 224   BMP Latest Ref Rng & Units 08/13/2019 08/12/2019 08/12/2019  Glucose 70 - 99 mg/dL 145(H) 126(H) 240(H)  BUN 8 - 23 mg/dL 25(H) 24(H) 26(H)  Creatinine 0.44 - 1.00 mg/dL 3.15(H) 3.07(H) 3.54(H)  BUN/Creat Ratio 12 - 28 - - -  Sodium 135 - 145 mmol/L 134(L) 139 137  Potassium 3.5 - 5.1 mmol/L 4.0 3.6 3.1(L)  Chloride 98 - 111 mmol/L 101 102 101  CO2 22 - 32 mmol/L 23 23 19(L)  Calcium 8.9 - 10.3 mg/dL 7.6(L) 7.6(L) 8.1(L)    CXR 08/12/2019:  IMPRESSION: No acute cardiopulmonary abnormality.  CT ABDOMEN PELVIS W CONTRAST 08/12/2019:  IMPRESSION: 1. Findings of enteritis and possibly mild colitis. History of constipation with no bowel obstruction or  abnormal stool retention. 2. 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 Consider postcontrast imaging. 3. Asymmetric thick appearance of the greater curvature stomach likely from under distension. If no recent EGD or outside comparison abdominal CT an upper GI would be reassuring. 4. Upper left hydroureter without visible obstructive process.  Discharge Instructions: Patient left AMA.    Advised patient to stay in hospital for further evaluation given her renal function and significant leukocytosis on labs. Discussed risks of leaving AMA including worsening renal function, electrolyte abnormalities, arrhythmia, volume overload and possible death. Patient understands but still wants to leave AMA. Advised patient to schedule appointment in [facility] ASAP. She reports that she needs 3 days advance notice for her transportation and will schedule when ready. The earliest appointment being 11/25. She is advised to hold her ACEi and diuretic.   Signed: Harvie Heck, MD  Internal Medicine, PGY-1 08/16/2019, 7:22 AM   Pager: [phone]

## 2019-08-16 ENCOUNTER — Telehealth: Payer: Self-pay

## 2019-08-16 NOTE — Telephone Encounter (Signed)
We received a fax medical release form from [facility] of [facility]. asking for medical records to be released. I called Timmie Foerster and left a voice mail message that we would need a Signed Medical Release from the patient.Our fax number was given and a call back number was given. The phone number for community of care [phone] [place], [place] C11/23/20202:19 PM

## 2019-08-17 LAB — CULTURE, BLOOD (ROUTINE X 2)
Culture: NO GROWTH
Culture: NO GROWTH
Special Requests: ADEQUATE
Special Requests: ADEQUATE

## 2019-08-26 ENCOUNTER — Other Ambulatory Visit: Payer: Self-pay | Admitting: Internal Medicine

## 2019-08-26 DIAGNOSIS — I10 Essential (primary) hypertension: Secondary | ICD-10-CM

## 2019-08-26 DIAGNOSIS — E785 Hyperlipidemia, unspecified: Secondary | ICD-10-CM

## 2019-08-26 NOTE — Telephone Encounter (Signed)
Pt has an appointment tomorrow AM. She may address this then.

## 2019-08-26 NOTE — Telephone Encounter (Signed)
Pt has an appointment tomorrow AM. She can request her refills then. Thanks!

## 2019-08-27 ENCOUNTER — Ambulatory Visit: Payer: Medicaid Other | Admitting: Internal Medicine

## 2019-08-27 ENCOUNTER — Other Ambulatory Visit: Payer: Self-pay

## 2019-08-27 VITALS — BP 171/90 | HR 92 | Temp 98.2°F | Ht 62.0 in | Wt 125.8 lb

## 2019-08-27 DIAGNOSIS — M7989 Other specified soft tissue disorders: Secondary | ICD-10-CM | POA: Insufficient documentation

## 2019-08-27 DIAGNOSIS — N1831 Chronic kidney disease, stage 3a: Secondary | ICD-10-CM | POA: Diagnosis not present

## 2019-08-27 DIAGNOSIS — N179 Acute kidney failure, unspecified: Secondary | ICD-10-CM

## 2019-08-27 DIAGNOSIS — M79604 Pain in right leg: Secondary | ICD-10-CM

## 2019-08-27 DIAGNOSIS — M79605 Pain in left leg: Secondary | ICD-10-CM

## 2019-08-27 DIAGNOSIS — K861 Other chronic pancreatitis: Secondary | ICD-10-CM | POA: Diagnosis not present

## 2019-08-27 DIAGNOSIS — N134 Hydroureter: Secondary | ICD-10-CM | POA: Diagnosis not present

## 2019-08-27 DIAGNOSIS — I129 Hypertensive chronic kidney disease with stage 1 through stage 4 chronic kidney disease, or unspecified chronic kidney disease: Secondary | ICD-10-CM | POA: Diagnosis not present

## 2019-08-27 DIAGNOSIS — E8770 Fluid overload, unspecified: Secondary | ICD-10-CM | POA: Diagnosis not present

## 2019-08-27 DIAGNOSIS — N181 Chronic kidney disease, stage 1: Secondary | ICD-10-CM

## 2019-08-27 DIAGNOSIS — Z79899 Other long term (current) drug therapy: Secondary | ICD-10-CM | POA: Diagnosis not present

## 2019-08-27 DIAGNOSIS — Z7689 Persons encountering health services in other specified circumstances: Secondary | ICD-10-CM | POA: Diagnosis not present

## 2019-08-27 DIAGNOSIS — I10 Essential (primary) hypertension: Secondary | ICD-10-CM

## 2019-08-27 DIAGNOSIS — Z720 Tobacco use: Secondary | ICD-10-CM | POA: Diagnosis not present

## 2019-08-27 LAB — CBC
HCT: 33.1 % — ABNORMAL LOW (ref 36.0–46.0)
Hemoglobin: 10.2 g/dL — ABNORMAL LOW (ref 12.0–15.0)
MCH: 23.2 pg — ABNORMAL LOW (ref 26.0–34.0)
MCHC: 30.8 g/dL (ref 30.0–36.0)
MCV: 75.4 fL — ABNORMAL LOW (ref 80.0–100.0)
Platelets: 372 10*3/uL (ref 150–400)
RBC: 4.39 MIL/uL (ref 3.87–5.11)
RDW: 16.2 % — ABNORMAL HIGH (ref 11.5–15.5)
WBC: 8.4 10*3/uL (ref 4.0–10.5)
nRBC: 0 % (ref 0.0–0.2)

## 2019-08-27 LAB — BASIC METABOLIC PANEL
Anion gap: 8 (ref 5–15)
BUN: 25 mg/dL — ABNORMAL HIGH (ref 8–23)
CO2: 25 mmol/L (ref 22–32)
Calcium: 8.6 mg/dL — ABNORMAL LOW (ref 8.9–10.3)
Chloride: 102 mmol/L (ref 98–111)
Creatinine, Ser: 3.16 mg/dL — ABNORMAL HIGH (ref 0.44–1.00)
GFR calc Af Amer: 17 mL/min — ABNORMAL LOW (ref >60)
GFR calc non Af Amer: 15 mL/min — ABNORMAL LOW (ref >60)
Glucose, Bld: 130 mg/dL — ABNORMAL HIGH (ref 70–99)
Potassium: 3.5 mmol/L (ref 3.5–5.1)
Sodium: 135 mmol/L (ref 135–145)

## 2019-08-27 LAB — LACTIC ACID, PLASMA: Lactic Acid, Venous: 1.5 mmol/L (ref 0.5–1.9)

## 2019-08-27 MED ORDER — FUROSEMIDE 20 MG PO TABS: 40.0000 mg | ORAL_TABLET | Freq: Two times a day (BID) | ORAL | 2 refills | Status: DC

## 2019-08-27 NOTE — Assessment & Plan Note (Signed)
Patient was noted to have AKI during her most recent admission, creatinine of 3.5 from 2.94 about 1 month prior and 1.89 about 7 months prior to that.  CT abdomen pelvis showed left upper ureter dilation without visual obstruction.  She had her lisinopril and Metformin held on discharge.  She is continuing to take her diuretic.  Today she reports that she has been urinating well, denies any issues.  Has not followed up with nephrology yet but seems to have an appointment in January.  She does appear fluid overloaded on exam, would likely benefit from admission however patient was declining at this time.  We will repeat stat BMP to evaluate kidney disease and trial her on increased dose of Lasix for now. -STAT BMP -Follow with nephrology

## 2019-08-27 NOTE — Assessment & Plan Note (Signed)
Patient was noted to be significantly hypertensive initially up to 194/89, repeat was 171/90.  She was recently admitted for an AKI but left AMA.  She reports that she checks her blood pressures at home and it was around 120/70. She is currently on verapamil 360 daily hydralazine 50 mg BID, and clonidine patch. She has been holding her lisinopril since her discharge. Will increase lasix for now and have close follow up.  -Continue verapamil 360 daily hydralazine 50 mg BID, and clonidine patch 0.2 mg weekly -Increased lasix 40 mg BID for now -RTC in 3 days

## 2019-08-27 NOTE — Patient Instructions (Addendum)
[name],  It was a pleasure to see you today. Thank you for coming in.   Today we discussed your leg pain and kidney function. It looks like your leg pain is due to some fluid overload.  You also have significantly elevated blood pressure.  We have gotten some labs and will contact you with the results, if they are still abnormal we may recommend for you to come into the hospital.  Please increase your lasix to 40 mg twice a day Please make sure that you stopped taking the Lisinopril and metformin  Please return to clinic on Monday or sooner if needed.   Thank you again for coming in.   [name]

## 2019-08-27 NOTE — Assessment & Plan Note (Addendum)
Patient was recently admitted from 11/19 till 11/20 for enteritis/colitis, AKI, anemia, and hypoglycemia.  She reported she was having constipation and took a stool softener which caused her to have severe abdominal pain, she was noted to have elevated white count and renal dysfunction.  CT abdomen showed enteritis and mild colitis, chronic pancreatitis, left hydroureter.  She was treated with antibiotics.  Noted to have a creatinine of 3 and nephrology was contacted and they recommended holding her diuretics and ACE inhibitors.  Patient left AMA.  She has not seen nephrology plan.  Today she is reporting bilateral lower extremity pain, located on the mid thigh to just below the knees, worse with walking, improves with the fluid pills.  She denies any fevers, chills, change in energy, chest pain, shortness of breath headaches, lightheadedness, or abdominal pain.  She has noted to be hypertensive initially up to 194/89, repeat was 171/90.  She has not been taking the lisinopril, still taking her verapamil 360 daily hydralazine 50 mg BID, and clonidine patch.  On exam she has significant lower extremity swelling, 2-3 + pitting edema up to her hips, 1+ edema in posterior abdomen, lungs are CTABL, normal work of breathing.  She does appear fluid overloaded and would likely benefit from admission for IV diuresis.  However after discussion with patient she reported that she did not want to come into the hospital at this time.  Discussed that we can obtain stat labs and contact her with results, however if labs are significantly worsened we may recommend admission.  We will increase her Lasix for now to see if this may help with her swelling. -Increase Lasix to 40 mg BID -STAT BMP, CBC, and lactic acid -RTC in 3 days to follow up  Addendum: BMP showed Cr is still elevated at 3.15, similar to when she was discharged. LA came back at 1.5. Contacted patient and informed her of results, do not think that she needs to be  admitted at this time. Will continue increased dose of lasix for now and have close follow up.

## 2019-08-27 NOTE — Progress Notes (Signed)
CC: Bilateral lower extremity pain, AKI  HPI: [name] is a 63 y.o.  with a PMH listed below presenting for bilateral lower extremity pain, AKI  Please see A&P for status of the patient's chronic medical conditions  Past Medical History:  Diagnosis Date  . Diabetes mellitus   . Ganglion cyst    right wrist  . GERD (gastroesophageal reflux disease)   . History of radiation therapy 02/27/10 -04/13/10   left base of tongue, bilat neck  . Hyperlipidemia   . Hypertension   . Iron deficiency anemia   . 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 Menopause   . Thyroid disease    Hyperthyroidism  . Tobacco abuse    PT HAS QUIT   Review of Systems: Refer to history of present illness and assessment and plans for pertinent review of systems, all others reviewed and negative.  Physical Exam:  Vitals:   08/27/19 1019  BP: (!) 194/89  Pulse: 94  Temp: 98.2 F (36.8 C)  TempSrc: Oral  SpO2: 100%  Weight: 125 lb 12.8 oz (57.1 kg)  Height: 5\' 2"  (1.575 m)   Physical Exam  Constitutional: She is oriented to person, place, and time and well-developed, well-nourished, and in no distress.  HENT:  Head: Normocephalic and atraumatic.  Cardiovascular: Normal rate, regular rhythm and normal heart sounds.  Pulmonary/Chest: Effort normal and breath sounds normal. No respiratory distress. She has no wheezes. She has no rales.  Abdominal: Soft. Bowel sounds are normal. She exhibits no distension. There is no abdominal tenderness.  1+ edema  Musculoskeletal:        General: Edema (2-3+ BL pitting edema up to hips) present.  Neurological: She is alert and oriented to person, place, and time.  Skin: Skin is warm and dry. No erythema.  Psychiatric: Mood and affect normal.    Social History   Socioeconomic History  . Marital status: Single    Spouse name: Not on file  . Number of children: Not on file  . Years of education: 57  . Highest education level: Not  on file  Occupational History    Employer: UNEMPLOYED    Employer: UNEMPLOYED  Social Needs  . Financial resource strain: Not on file  . Food insecurity    Worry: Not on file    Inability: Not on file  . Transportation needs    Medical: Not on file    Non-medical: Not on file  Tobacco Use  . Smoking status: Current Every Day Smoker    Packs/day: 0.30    Last attempt to quit: 02/09/2009    Years since quitting: 10.5  . Smokeless tobacco: Never Used  . Tobacco comment: 1/3 pk every 3 days  Substance and Sexual Activity  . Alcohol use: No    Alcohol/week: 0.0 standard drinks  . Drug use: No  . Sexual activity: Not on file  Lifestyle  . Physical activity    Days per week: Not on file    Minutes per session: Not on file  . Stress: Not on file  Relationships  . Social Herbalist on phone: Not on file    Gets together: Not on file    Attends religious service: Not on file    Active member of club or organization: Not on file    Attends meetings of clubs or organizations: Not on file    Relationship status: Not on file  . Intimate partner violence  Fear of current or ex partner: Not on file    Emotionally abused: Not on file    Physically abused: Not on file    Forced sexual activity: Not on file  Other Topics Concern  . Not on file  Social History Narrative  . Not on file    Family History  Problem Relation Age of Onset  . Stroke Father   . Cancer Father        unknown ca  . Hypertension Mother   . Diabetes Mother   . Chronic Renal Failure Sister   . Chronic Renal Failure Brother   . Cancer Paternal Aunt        colon ca  . Breast cancer Neg Hx     Assessment & Plan:   See Encounters Tab for problem based charting.  Patient discussed with Dr. Angelia Mould

## 2019-08-30 ENCOUNTER — Encounter: Payer: Medicaid Other | Admitting: Internal Medicine

## 2019-08-30 NOTE — Progress Notes (Signed)
Internal Medicine Clinic Attending  Case discussed with Dr. Krienke at the time of the visit.  We reviewed the resident's history and exam and pertinent patient test results.  I agree with the assessment, diagnosis, and plan of care documented in the resident's note.    

## 2019-09-01 ENCOUNTER — Ambulatory Visit: Payer: Medicaid Other

## 2019-09-01 ENCOUNTER — Encounter: Payer: Self-pay | Admitting: Internal Medicine

## 2019-09-04 ENCOUNTER — Other Ambulatory Visit: Payer: Self-pay | Admitting: Student in an Organized Health Care Education/Training Program

## 2019-09-04 DIAGNOSIS — Z794 Long term (current) use of insulin: Secondary | ICD-10-CM

## 2019-09-04 DIAGNOSIS — E119 Type 2 diabetes mellitus without complications: Secondary | ICD-10-CM

## 2019-09-21 ENCOUNTER — Other Ambulatory Visit: Payer: Self-pay | Admitting: Internal Medicine

## 2019-09-21 DIAGNOSIS — Z1231 Encounter for screening mammogram for malignant neoplasm of breast: Secondary | ICD-10-CM

## 2019-09-29 DIAGNOSIS — R809 Proteinuria, unspecified: Secondary | ICD-10-CM | POA: Diagnosis not present

## 2019-09-29 DIAGNOSIS — Z7689 Persons encountering health services in other specified circumstances: Secondary | ICD-10-CM | POA: Diagnosis not present

## 2019-09-29 DIAGNOSIS — N1832 Chronic kidney disease, stage 3b: Secondary | ICD-10-CM | POA: Diagnosis not present

## 2019-09-29 DIAGNOSIS — I129 Hypertensive chronic kidney disease with stage 1 through stage 4 chronic kidney disease, or unspecified chronic kidney disease: Secondary | ICD-10-CM | POA: Diagnosis not present

## 2019-09-29 DIAGNOSIS — E611 Iron deficiency: Secondary | ICD-10-CM | POA: Diagnosis not present

## 2019-09-29 DIAGNOSIS — N184 Chronic kidney disease, stage 4 (severe): Secondary | ICD-10-CM | POA: Diagnosis not present

## 2019-09-29 DIAGNOSIS — E1129 Type 2 diabetes mellitus with other diabetic kidney complication: Secondary | ICD-10-CM | POA: Diagnosis not present

## 2019-10-04 ENCOUNTER — Other Ambulatory Visit: Payer: Self-pay | Admitting: Nephrology

## 2019-10-04 DIAGNOSIS — N184 Chronic kidney disease, stage 4 (severe): Secondary | ICD-10-CM

## 2019-10-06 ENCOUNTER — Other Ambulatory Visit: Payer: Self-pay | Admitting: Nephrology

## 2019-10-12 ENCOUNTER — Ambulatory Visit
Admission: RE | Admit: 2019-10-12 | Discharge: 2019-10-12 | Disposition: A | Discharge status: Home / Self Care | Payer: Medicaid Other | Source: Ambulatory Visit | Attending: Nephrology | Admitting: Nephrology

## 2019-10-12 DIAGNOSIS — N184 Chronic kidney disease, stage 4 (severe): Secondary | ICD-10-CM

## 2019-10-12 DIAGNOSIS — Z7689 Persons encountering health services in other specified circumstances: Secondary | ICD-10-CM | POA: Diagnosis not present

## 2019-10-12 DIAGNOSIS — N189 Chronic kidney disease, unspecified: Secondary | ICD-10-CM | POA: Diagnosis not present

## 2019-10-12 DIAGNOSIS — N281 Cyst of kidney, acquired: Secondary | ICD-10-CM | POA: Diagnosis not present

## 2019-10-28 ENCOUNTER — Other Ambulatory Visit: Payer: Self-pay | Admitting: Internal Medicine

## 2019-10-28 DIAGNOSIS — E785 Hyperlipidemia, unspecified: Secondary | ICD-10-CM

## 2019-11-04 DIAGNOSIS — N2581 Secondary hyperparathyroidism of renal origin: Secondary | ICD-10-CM | POA: Diagnosis not present

## 2019-11-04 DIAGNOSIS — I129 Hypertensive chronic kidney disease with stage 1 through stage 4 chronic kidney disease, or unspecified chronic kidney disease: Secondary | ICD-10-CM | POA: Diagnosis not present

## 2019-11-04 DIAGNOSIS — E611 Iron deficiency: Secondary | ICD-10-CM | POA: Diagnosis not present

## 2019-11-04 DIAGNOSIS — N184 Chronic kidney disease, stage 4 (severe): Secondary | ICD-10-CM | POA: Diagnosis not present

## 2019-11-05 ENCOUNTER — Ambulatory Visit: Payer: Medicaid Other

## 2019-11-10 ENCOUNTER — Other Ambulatory Visit: Payer: Self-pay

## 2019-11-10 DIAGNOSIS — E119 Type 2 diabetes mellitus without complications: Secondary | ICD-10-CM

## 2019-11-10 MED ORDER — BD PEN NEEDLE MINI U/F 31G X 5 MM MISC: 8 refills | Status: DC

## 2019-11-26 DIAGNOSIS — Z7689 Persons encountering health services in other specified circumstances: Secondary | ICD-10-CM | POA: Diagnosis not present

## 2019-11-26 DIAGNOSIS — E611 Iron deficiency: Secondary | ICD-10-CM | POA: Diagnosis not present

## 2019-11-26 DIAGNOSIS — N1832 Chronic kidney disease, stage 3b: Secondary | ICD-10-CM | POA: Diagnosis not present

## 2019-11-26 DIAGNOSIS — R809 Proteinuria, unspecified: Secondary | ICD-10-CM | POA: Diagnosis not present

## 2019-11-30 ENCOUNTER — Other Ambulatory Visit: Payer: Self-pay

## 2019-11-30 DIAGNOSIS — Z794 Long term (current) use of insulin: Secondary | ICD-10-CM

## 2019-11-30 DIAGNOSIS — E119 Type 2 diabetes mellitus without complications: Secondary | ICD-10-CM

## 2019-11-30 MED ORDER — "INSULIN SYRINGE-NEEDLE U-100 31G X 15/64"" 0.3 ML MISC": 3 refills | Status: DC

## 2019-11-30 NOTE — Telephone Encounter (Signed)
Insulin Syringe-Needle U-100 31G X 15/64" 0.3 ML MISC, REFILL REQUEST @  [id number] [place], [place] [phone] (Phone) [phone] (Fax)

## 2019-12-06 ENCOUNTER — Ambulatory Visit
Admission: RE | Admit: 2019-12-06 | Discharge: 2019-12-06 | Disposition: A | Discharge status: Home / Self Care | Payer: Medicaid Other | Source: Ambulatory Visit | Attending: Internal Medicine | Admitting: Internal Medicine

## 2019-12-06 ENCOUNTER — Other Ambulatory Visit: Payer: Self-pay

## 2019-12-06 DIAGNOSIS — Z1231 Encounter for screening mammogram for malignant neoplasm of breast: Secondary | ICD-10-CM | POA: Diagnosis not present

## 2019-12-06 DIAGNOSIS — Z7689 Persons encountering health services in other specified circumstances: Secondary | ICD-10-CM | POA: Diagnosis not present

## 2019-12-14 ENCOUNTER — Encounter: Payer: Self-pay | Admitting: *Deleted

## 2019-12-14 DIAGNOSIS — H04123 Dry eye syndrome of bilateral lacrimal glands: Secondary | ICD-10-CM | POA: Diagnosis not present

## 2019-12-14 DIAGNOSIS — E119 Type 2 diabetes mellitus without complications: Secondary | ICD-10-CM | POA: Diagnosis not present

## 2019-12-14 DIAGNOSIS — H40013 Open angle with borderline findings, low risk, bilateral: Secondary | ICD-10-CM | POA: Diagnosis not present

## 2019-12-14 DIAGNOSIS — H2513 Age-related nuclear cataract, bilateral: Secondary | ICD-10-CM | POA: Diagnosis not present

## 2019-12-14 DIAGNOSIS — Z7689 Persons encountering health services in other specified circumstances: Secondary | ICD-10-CM | POA: Diagnosis not present

## 2019-12-14 LAB — HM DIABETES EYE EXAM

## 2019-12-16 DIAGNOSIS — Z7689 Persons encountering health services in other specified circumstances: Secondary | ICD-10-CM | POA: Diagnosis not present

## 2019-12-20 ENCOUNTER — Encounter: Payer: Self-pay | Admitting: *Deleted

## 2019-12-21 ENCOUNTER — Other Ambulatory Visit: Payer: Self-pay | Admitting: *Deleted

## 2019-12-21 DIAGNOSIS — E119 Type 2 diabetes mellitus without complications: Secondary | ICD-10-CM

## 2019-12-21 MED ORDER — JANUMET 50-1000 MG PO TABS: 1.0000 | ORAL_TABLET | Freq: Two times a day (BID) | ORAL | 1 refills | Status: DC

## 2019-12-22 DIAGNOSIS — N184 Chronic kidney disease, stage 4 (severe): Secondary | ICD-10-CM | POA: Diagnosis not present

## 2019-12-22 DIAGNOSIS — E611 Iron deficiency: Secondary | ICD-10-CM | POA: Diagnosis not present

## 2019-12-22 DIAGNOSIS — E1129 Type 2 diabetes mellitus with other diabetic kidney complication: Secondary | ICD-10-CM | POA: Diagnosis not present

## 2019-12-22 DIAGNOSIS — N2581 Secondary hyperparathyroidism of renal origin: Secondary | ICD-10-CM | POA: Diagnosis not present

## 2019-12-22 DIAGNOSIS — I129 Hypertensive chronic kidney disease with stage 1 through stage 4 chronic kidney disease, or unspecified chronic kidney disease: Secondary | ICD-10-CM | POA: Diagnosis not present

## 2020-01-04 ENCOUNTER — Other Ambulatory Visit: Payer: Self-pay | Admitting: *Deleted

## 2020-01-04 DIAGNOSIS — I10 Essential (primary) hypertension: Secondary | ICD-10-CM

## 2020-01-10 MED ORDER — FUROSEMIDE 20 MG PO TABS: 40.0000 mg | ORAL_TABLET | Freq: Two times a day (BID) | ORAL | 1 refills | Status: DC

## 2020-01-24 ENCOUNTER — Other Ambulatory Visit: Payer: Self-pay | Admitting: Internal Medicine

## 2020-01-24 DIAGNOSIS — E785 Hyperlipidemia, unspecified: Secondary | ICD-10-CM

## 2020-01-24 NOTE — Telephone Encounter (Signed)
atorvastatin (LIPITOR) 40 MG tablet, REFILL REQUEST @  [id number] [address], [place] (Phone) [phone] (Fax)

## 2020-01-31 ENCOUNTER — Encounter: Payer: Self-pay | Admitting: *Deleted

## 2020-01-31 ENCOUNTER — Other Ambulatory Visit: Payer: Self-pay | Admitting: *Deleted

## 2020-01-31 DIAGNOSIS — N181 Chronic kidney disease, stage 1: Secondary | ICD-10-CM

## 2020-02-01 ENCOUNTER — Encounter: Payer: Self-pay | Admitting: Vascular Surgery

## 2020-02-01 ENCOUNTER — Ambulatory Visit (INDEPENDENT_AMBULATORY_CARE_PROVIDER_SITE_OTHER)
Admission: RE | Admit: 2020-02-01 | Discharge: 2020-02-01 | Disposition: A | Discharge status: Home / Self Care | Payer: Medicaid Other | Source: Ambulatory Visit | Attending: Vascular Surgery | Admitting: Vascular Surgery

## 2020-02-01 ENCOUNTER — Encounter (HOSPITAL_COMMUNITY): Payer: Medicaid Other

## 2020-02-01 ENCOUNTER — Encounter: Payer: Medicaid Other | Admitting: Vascular Surgery

## 2020-02-01 ENCOUNTER — Ambulatory Visit (INDEPENDENT_AMBULATORY_CARE_PROVIDER_SITE_OTHER): Payer: Medicaid Other | Admitting: Vascular Surgery

## 2020-02-01 ENCOUNTER — Ambulatory Visit (HOSPITAL_COMMUNITY)
Admission: RE | Admit: 2020-02-01 | Discharge: 2020-02-01 | Disposition: A | Discharge status: Home / Self Care | Payer: Medicaid Other | Source: Ambulatory Visit | Attending: Vascular Surgery | Admitting: Vascular Surgery

## 2020-02-01 ENCOUNTER — Other Ambulatory Visit: Payer: Self-pay

## 2020-02-01 VITALS — BP 191/98 | HR 100 | Temp 97.3°F | Resp 20 | Ht 62.0 in | Wt 111.0 lb

## 2020-02-01 DIAGNOSIS — N181 Chronic kidney disease, stage 1: Secondary | ICD-10-CM

## 2020-02-01 NOTE — Progress Notes (Signed)
[facility] [place]  Patient name: Susan Evans MRN: [id number] DOB: [birth year] Sex: female  REASON FOR CONSULT: Discuss access for hemodialysis  HPI: Susan Evans is a 64 y.o. female, who is here for discussion of hemodialysis access.  She seems to have a very poor understanding of her disease process.  She did have a brother who was on hemodialysis.  She is not on dialysis currently and has no uremic symptoms.  He is right-handed.  He does have chronic diabetes and hypertension  Past Medical History:  Diagnosis Date  . Chronic kidney disease   . Diabetes mellitus   . Ganglion cyst    right wrist  . GERD (gastroesophageal reflux disease)   . History of radiation therapy 02/27/10 -04/13/10   left base of tongue, bilat neck  . Hyperlipidemia   . Hypertension   . Iron deficiency anemia   . 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 Menopause   . Thyroid disease    Hyperthyroidism  . Tobacco abuse    PT HAS QUIT    Family History  Problem Relation Age of Onset  . Stroke Father   . Cancer Father        unknown ca  . Hypertension Mother   . Diabetes Mother   . Chronic Renal Failure Sister   . Chronic Renal Failure Brother   . Cancer Paternal Aunt        colon ca  . Breast cancer Neg Hx     SOCIAL HISTORY: Social History   Socioeconomic History  . Marital status: Single    Spouse name: Not on file  . Number of children: Not on file  . Years of education: 31  . Highest education level: Not on file  Occupational History    Employer: UNEMPLOYED    Employer: UNEMPLOYED  Tobacco Use  . Smoking status: Former Smoker    Packs/day: 0.30    Quit date: 02/09/2009    Years since quitting: 10.9  . Smokeless tobacco: Never Used  . Tobacco comment: 1/3 pk every 3 days  Substance and Sexual Activity  . Alcohol use: No    Alcohol/week: 0.0 standard drinks  . Drug use: No  . Sexual  activity: Not on file  Other Topics Concern  . Not on file  Social History Narrative  . Not on file   Social Determinants of Health   Financial Resource Strain:   . Difficulty of Paying Living Expenses:   Food Insecurity:   . Worried About Charity fundraiser in the Last Year:   . Arboriculturist in the Last Year:   Transportation Needs:   . Film/video editor (Medical):   Marland Kitchen Lack of Transportation (Non-Medical):   Physical Activity:   . Days of Exercise per Week:   . Minutes of Exercise per Session:   Stress:   . Feeling of Stress :   Social Connections:   . Frequency of Communication with Friends and Family:   . Frequency of Social Gatherings with Friends and Family:   . Attends Religious Services:   . Active Member of Clubs or Organizations:   . Attends Archivist Meetings:   Marland Kitchen Marital Status:   Intimate Partner Violence:   . Fear of Current or Ex-Partner:   . Emotionally Abused:   Marland Kitchen Physically Abused:   . Sexually Abused:     Allergies  Allergen Reactions  . Hydrochlorothiazide Itching  and Other (See Comments)    Hyponatremia   . Penicillins Other (See Comments)    "takes all of her hair out" .Has patient had a PCN reaction causing immediate rash, facial/tongue/throat swelling, SOB or lightheadedness with hypotension: No Has patient had a PCN reaction causing severe rash involving mucus membranes or skin necrosis: No Has patient had a PCN reaction that required hospitalization No Has patient had a PCN reaction occurring within the last 10 years: No If all of the above answers are "NO", then may proceed with Cephalosporin use.     Current Outpatient Medications  Medication Sig Dispense Refill  . Accu-Chek FastClix Lancets MISC 5 (FIVE) TIMES DAILY DIAGNOSIS CODE E11.9 204 each 8  . atorvastatin (LIPITOR) 40 MG tablet TAKE 1 TABLET BY MOUTH EVERY DAY 90 tablet 3  . Blood Glucose Monitoring Suppl (ACCU-CHEK AVIVA PLUS) w/Device KIT Use to test blood  glucose  3 times daily 1 kit 0  . calcium-vitamin D (CALCIUM 500+D HIGH POTENCY) 500-400 MG-UNIT tablet Take 1 tablet by mouth daily. 90 tablet 3  . [facility] D3 50 MCG (2000 UT) CAPS TAKE 1 CAPSULE BY MOUTH EVERY DAY (Patient taking differently: Take 2,000 Units by mouth daily. ) 90 capsule 3  . furosemide (LASIX) 20 MG tablet Take 2 tablets (40 mg total) by mouth 2 (two) times daily. 360 tablet 1  . glucose blood (ACCU-CHEK GUIDE) test strip 5 (five) times daily Diagnosis code E11.9 150 each 12  . hydrALAZINE (APRESOLINE) 50 MG tablet Take 50 mg by mouth 2 (two) times daily.    . insulin aspart (NOVOLOG FLEXPEN) 100 UNIT/ML FlexPen INJECT 7 UNITS WITH BREAKFAST, 10 WITH LUNCH, AND 8 WITH DINNER (Patient taking differently: Inject 7-10 Units into the skin See admin instructions. INJECT 7 UNITS WITH BREAKFAST, 10 WITH LUNCH, AND 8 WITH DINNER) 15 mL 6  . insulin glargine (LANTUS) 100 UNIT/ML injection INJECT 0.2ML INTO THE SKIN TWICE A DAY, 15 UNITS IN AM AND 15 UNITS IN EVENING (Patient taking differently: Inject 15 Units into the skin 2 (two) times daily. ) 20 mL 4  . Insulin Pen Needle (B-D UF III MINI PEN NEEDLES) 31G X 5 MM MISC USE TO INJECT NOVOLOG 3 TIMES A DAY. DIAGNOSIS CODE E11.9, Z79.4 100 each 8  . Insulin Syringe-Needle U-100 31G X 15/64" 0.3 ML MISC USE TO INJECT LANTUS INSULIN TWO TIMES A DAY 180 each 3  . lisinopril (ZESTRIL) 20 MG tablet Take 20 mg by mouth daily.    Marland Kitchen loratadine (CLARITIN) 10 MG tablet Take 1 tablet (10 mg total) by mouth daily. 30 tablet 2  . omeprazole (PRILOSEC) 20 MG capsule Take 1 capsule (20 mg total) by mouth daily. 180 capsule 1  . Polyethyl Glycol-Propyl Glycol (SYSTANE OP) Apply 1 drop to eye daily as needed (for dry eyes).    . sitaGLIPtin-metformin (JANUMET) 50-1000 MG tablet Take 1 tablet by mouth 2 (two) times daily with a meal. 180 tablet 1  . verapamil (CALAN-SR) 180 MG CR tablet Take 360 mg by mouth at bedtime.      No current facility-administered  medications for this visit.    REVIEW OF SYSTEMS:  '[X]'$  denotes positive finding, '[ ]'$  denotes negative finding Cardiac  Comments:  Chest pain or chest pressure:    Shortness of breath upon exertion:    Short of breath when lying flat:    Irregular heart rhythm:        Vascular    Pain in calf, thigh,  or hip brought on by ambulation:    Pain in feet at night that wakes you up from your sleep:     Blood clot in your veins:    Leg swelling:         Pulmonary    Oxygen at home:    Productive cough:     Wheezing:         Neurologic    Sudden weakness in arms or legs:     Sudden numbness in arms or legs:     Sudden onset of difficulty speaking or slurred speech:    Temporary loss of vision in one eye:     Problems with dizziness:         Gastrointestinal    Blood in stool:     Vomited blood:         Genitourinary    Burning when urinating:     Blood in urine:        Psychiatric    Major depression:         Hematologic    Bleeding problems:    Problems with blood clotting too easily:        Skin    Rashes or ulcers:        Constitutional    Fever or chills:      PHYSICAL EXAM: Vitals:   02/01/20 1248  BP: (!) 191/98  Pulse: 100  Resp: 20  Temp: (!) 97.3 F (36.3 C)  SpO2: 98%  Weight: 111 lb (50.3 kg)  Height: 5' 2"  (1.575 m)    GENERAL: The patient is a well-nourished female, in no acute distress. The vital signs are documented above. CARDIOVASCULAR: 2+ brachial and 1-2+ radial pulses bilaterally.  Extremely small surface veins bilaterally PULMONARY: There is good air exchange  ABDOMEN: Soft and non-tender  MUSCULOSKELETAL: There are no major deformities or cyanosis. NEUROLOGIC: No focal weakness or paresthesias are detected. SKIN: There are no ulcers or rashes noted. PSYCHIATRIC: The patient has a normal affect.  DATA:  Complete nonvisualization of cephalic and basilic veins bilaterally.  Triphasic flow at the wrist radial artery  bilaterally  MEDICAL ISSUES: I discussed options with the patient for hemodialysis.  She clearly is not a candidate for fistula creation due to nonvisualization of cephalic and basilic veins bilaterally explained that she will need a left arm AV graft as her initial dialysis access.  Explained that we would defer this until she is imminently approaching need for hemodialysis.  She understands.  We will await direction from [facility] regarding timing   Rosetta Posner, MD [place] [facility] Tel [phone] Pager [phone]

## 2020-02-08 ENCOUNTER — Other Ambulatory Visit: Payer: Self-pay | Admitting: *Deleted

## 2020-02-15 ENCOUNTER — Other Ambulatory Visit: Payer: Self-pay

## 2020-02-15 MED ORDER — OMEPRAZOLE 20 MG PO CPDR: 20.0000 mg | DELAYED_RELEASE_CAPSULE | Freq: Every day | ORAL | 0 refills | Status: DC

## 2020-02-15 MED ORDER — VERAPAMIL HCL ER 180 MG PO TBCR: 360.0000 mg | EXTENDED_RELEASE_TABLET | Freq: Every day | ORAL | 0 refills | Status: DC

## 2020-02-15 NOTE — Telephone Encounter (Signed)
omeprazole (PRILOSEC) 20 MG capsule, REFILL REQUEST @  [id number] [address], [place] [phone] (Phone) [phone] (Fax)

## 2020-02-15 NOTE — Telephone Encounter (Signed)
Needs appt HTN ASAP

## 2020-02-16 DIAGNOSIS — E785 Hyperlipidemia, unspecified: Secondary | ICD-10-CM | POA: Diagnosis not present

## 2020-02-16 DIAGNOSIS — N2581 Secondary hyperparathyroidism of renal origin: Secondary | ICD-10-CM | POA: Diagnosis not present

## 2020-02-16 DIAGNOSIS — E1129 Type 2 diabetes mellitus with other diabetic kidney complication: Secondary | ICD-10-CM | POA: Diagnosis not present

## 2020-02-16 DIAGNOSIS — I129 Hypertensive chronic kidney disease with stage 1 through stage 4 chronic kidney disease, or unspecified chronic kidney disease: Secondary | ICD-10-CM | POA: Diagnosis not present

## 2020-02-16 DIAGNOSIS — Z7689 Persons encountering health services in other specified circumstances: Secondary | ICD-10-CM | POA: Diagnosis not present

## 2020-02-16 DIAGNOSIS — E611 Iron deficiency: Secondary | ICD-10-CM | POA: Diagnosis not present

## 2020-02-16 DIAGNOSIS — N184 Chronic kidney disease, stage 4 (severe): Secondary | ICD-10-CM | POA: Diagnosis not present

## 2020-02-16 NOTE — Telephone Encounter (Signed)
Spoke with the patient.  She is sch for 02/29/2020 with her PCP.

## 2020-02-22 MED ORDER — OMEPRAZOLE 20 MG PO CPDR: 20.0000 mg | DELAYED_RELEASE_CAPSULE | Freq: Every day | ORAL | 0 refills | Status: DC

## 2020-02-29 ENCOUNTER — Ambulatory Visit: Payer: Medicaid Other | Admitting: Internal Medicine

## 2020-02-29 ENCOUNTER — Encounter: Payer: Self-pay | Admitting: Internal Medicine

## 2020-02-29 VITALS — BP 124/64 | HR 88 | Temp 98.1°F | Ht 62.0 in | Wt 103.5 lb

## 2020-02-29 DIAGNOSIS — N189 Chronic kidney disease, unspecified: Secondary | ICD-10-CM

## 2020-02-29 DIAGNOSIS — E1122 Type 2 diabetes mellitus with diabetic chronic kidney disease: Secondary | ICD-10-CM

## 2020-02-29 DIAGNOSIS — Z794 Long term (current) use of insulin: Secondary | ICD-10-CM

## 2020-02-29 DIAGNOSIS — E118 Type 2 diabetes mellitus with unspecified complications: Secondary | ICD-10-CM | POA: Diagnosis not present

## 2020-02-29 DIAGNOSIS — M810 Age-related osteoporosis without current pathological fracture: Secondary | ICD-10-CM

## 2020-02-29 DIAGNOSIS — N181 Chronic kidney disease, stage 1: Secondary | ICD-10-CM | POA: Diagnosis not present

## 2020-02-29 DIAGNOSIS — E119 Type 2 diabetes mellitus without complications: Secondary | ICD-10-CM

## 2020-02-29 DIAGNOSIS — E785 Hyperlipidemia, unspecified: Secondary | ICD-10-CM

## 2020-02-29 DIAGNOSIS — F17210 Nicotine dependence, cigarettes, uncomplicated: Secondary | ICD-10-CM | POA: Diagnosis not present

## 2020-02-29 DIAGNOSIS — I129 Hypertensive chronic kidney disease with stage 1 through stage 4 chronic kidney disease, or unspecified chronic kidney disease: Secondary | ICD-10-CM

## 2020-02-29 DIAGNOSIS — Z0000 Encounter for general adult medical examination without abnormal findings: Secondary | ICD-10-CM

## 2020-02-29 DIAGNOSIS — I10 Essential (primary) hypertension: Secondary | ICD-10-CM

## 2020-02-29 DIAGNOSIS — Z7689 Persons encountering health services in other specified circumstances: Secondary | ICD-10-CM | POA: Diagnosis not present

## 2020-02-29 DIAGNOSIS — Z720 Tobacco use: Secondary | ICD-10-CM

## 2020-02-29 LAB — POCT GLYCOSYLATED HEMOGLOBIN (HGB A1C): Hemoglobin A1C: 6.4 % — AB (ref 4.0–5.6)

## 2020-02-29 LAB — GLUCOSE, CAPILLARY: Glucose-Capillary: 177 mg/dL — ABNORMAL HIGH (ref 70–99)

## 2020-02-29 MED ORDER — ACCU-CHEK GUIDE VI STRP: ORAL_STRIP | 12 refills | Status: DC

## 2020-02-29 MED ORDER — BD PEN NEEDLE MINI U/F 31G X 5 MM MISC: 8 refills | Status: DC

## 2020-02-29 MED ORDER — ATORVASTATIN CALCIUM 40 MG PO TABS: 40.0000 mg | ORAL_TABLET | Freq: Every day | ORAL | 3 refills | Status: DC

## 2020-02-29 MED ORDER — HYDRALAZINE HCL 50 MG PO TABS: 50.0000 mg | ORAL_TABLET | Freq: Two times a day (BID) | ORAL | 3 refills | Status: DC

## 2020-02-29 MED ORDER — INSULIN GLARGINE 100 UNIT/ML ~~LOC~~ SOLN: SUBCUTANEOUS | 4 refills | Status: DC

## 2020-02-29 MED ORDER — "INSULIN SYRINGE-NEEDLE U-100 31G X 15/64"" 0.3 ML MISC": 3 refills | Status: DC

## 2020-02-29 MED ORDER — CALCIUM CARBONATE-VITAMIN D 500-400 MG-UNIT PO TABS: 1.0000 | ORAL_TABLET | Freq: Every day | ORAL | 3 refills | Status: DC

## 2020-02-29 MED ORDER — NOVOLOG FLEXPEN 100 UNIT/ML ~~LOC~~ SOPN: PEN_INJECTOR | SUBCUTANEOUS | 6 refills | Status: DC

## 2020-02-29 MED ORDER — VERAPAMIL HCL ER 180 MG PO TBCR: 360.0000 mg | EXTENDED_RELEASE_TABLET | Freq: Every day | ORAL | 0 refills | Status: DC

## 2020-02-29 NOTE — Progress Notes (Signed)
   CC: HTN follow up  HPI: Ms.Tyresa L Beining is a 64 y.o. with a hx as noted below who presents today for HTN follow up.  Please refer to the problem based charting for further details.  Past Medical History:  Diagnosis Date  . Chronic kidney disease   . Diabetes mellitus   . Ganglion cyst    right wrist  . GERD (gastroesophageal reflux disease)   . History of radiation therapy 02/27/10 -04/13/10   left base of tongue, bilat neck  . Hyperlipidemia   . Hypertension   . Iron deficiency anemia   . 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 Menopause   . Thyroid disease    Hyperthyroidism  . Tobacco abuse    PT HAS QUIT   Review of Systems: Systems have been reviewed and are negative unless mentioned in the HPI.  Physical Exam: Vitals:   02/29/20 1512 02/29/20 1622  BP: (!) 157/63 124/64  Pulse: 95 88  Temp: 98.1 F (36.7 C)   TempSrc: Oral   SpO2: 100%   Weight: 103 lb 8 oz (46.9 kg)   Height: 5\' 2"  (1.575 m)    Physical Exam Vitals reviewed.  Constitutional:      General: She is not in acute distress.    Appearance: Normal appearance. She is normal weight. She is not ill-appearing, toxic-appearing or diaphoretic.  HENT:     Head: Normocephalic and atraumatic.  Cardiovascular:     Rate and Rhythm: Normal rate and regular rhythm.     Heart sounds: Normal heart sounds. No murmur. No friction rub. No gallop.   Pulmonary:     Effort: Pulmonary effort is normal. No respiratory distress.     Breath sounds: Normal breath sounds. No wheezing or rales.  Abdominal:     General: Abdomen is flat. Bowel sounds are normal. There is no distension.     Palpations: Abdomen is soft.     Tenderness: There is no abdominal tenderness. There is no guarding.  Musculoskeletal:     Right lower leg: No edema.     Left lower leg: No edema.  Neurological:     General: No focal deficit present.     Mental Status: She is alert and oriented to person, place, and time.    Psychiatric:        Mood and Affect: Mood normal.    Assessment & Plan:   See Encounters Tab for problem based charting.  Patient discussed with Dr. Heber Felton

## 2020-02-29 NOTE — Patient Instructions (Addendum)
Thank you for visiting [place] in clinic today.  Below is a summary of what we discussed:  1.  Hypertension -Continue taking verapamil 360 mg nightly, hydralazine 50 mg twice daily, and Lasix 20 mg daily  2.  Diabetes -Continue NovoLog 3 times a day with meals -Continue Lantus 15 units twice daily  3.  Kidney disease -Please continue to follow up with the kidney doctors, and vascular surgeons to determine when it is appropriate to start dialysis.  If you have any questions or concerns, please feel free to reach out to [place].

## 2020-03-01 LAB — BMP8+ANION GAP
Anion Gap: 15 mmol/L (ref 10.0–18.0)
BUN/Creatinine Ratio: 11 — ABNORMAL LOW (ref 12–28)
BUN: 40 mg/dL — ABNORMAL HIGH (ref 8–27)
CO2: 23 mmol/L (ref 20–29)
Calcium: 8.3 mg/dL — ABNORMAL LOW (ref 8.7–10.3)
Chloride: 97 mmol/L (ref 96–106)
Creatinine, Ser: 3.61 mg/dL — ABNORMAL HIGH (ref 0.57–1.00)
GFR calc Af Amer: 15 mL/min/{1.73_m2} — ABNORMAL LOW (ref >59)
GFR calc non Af Amer: 13 mL/min/{1.73_m2} — ABNORMAL LOW (ref >59)
Glucose: 171 mg/dL — ABNORMAL HIGH (ref 65–99)
Potassium: 3.8 mmol/L (ref 3.5–5.2)
Sodium: 135 mmol/L (ref 134–144)

## 2020-03-01 LAB — LIPID PANEL
Chol/HDL Ratio: 3.7 ratio (ref 0.0–4.4)
Cholesterol, Total: 177 mg/dL (ref 100–199)
HDL: 48 mg/dL (ref >39)
LDL Chol Calc (NIH): 92 mg/dL (ref 0–99)
Triglycerides: 218 mg/dL — ABNORMAL HIGH (ref 0–149)
VLDL Cholesterol Cal: 37 mg/dL (ref 5–40)

## 2020-03-01 NOTE — Assessment & Plan Note (Signed)
Patient is weaned her smoking down to 2 to 3 cigarettes a day.  She does not think that she needs any prescriptions to help her stop, and that she would get there on her own.  Plan: -Discussed smoking cessation at next visit

## 2020-03-01 NOTE — Assessment & Plan Note (Addendum)
I refilled the medications associated with this diagnosis.  I find it to be medically necessary and appropriate  Plan: -Continue Lipitor 40 mg daily -Lipid profile due today

## 2020-03-01 NOTE — Assessment & Plan Note (Signed)
Medications associated with this diagnosis was refilled at the patient's request.  I find it to be medically necessary and appropriate.

## 2020-03-01 NOTE — Assessment & Plan Note (Signed)
Patient states that she has not had any issues with her diabetes medications and takes everything she is prescribed consistently.  She currently takes NovoLog 7 units in the morning, 10 units with lunch, and 8 units with dinner in addition to Lantus 15 units twice daily.  Today, her A1c is 6.4 from 7.0 eight months ago.  This may be artificially low due to her chronic kidney disease.  Plan: -Continue Lantus 15 units twice daily -Continue NovoLog 3 times daily with meals -The above medications were refilled

## 2020-03-01 NOTE — Assessment & Plan Note (Addendum)
Patient has a longstanding history of hypertension.  Patient was hospitalized at the end of 2020 for gastritis and an AKI, but left AMA.  Around that time, the patient was noted to be hypertensive to the 170s-190s.  Since that time, the patient has been taking verapamil 100 mg twice daily, hydralazine 50 mg twice daily and Lasix 20 mg daily.  She states that she has been compliant with these medications.  She also measures her blood pressures at home, and consistently sees pressures in the 120s-130s.  Today, initially her blood pressure was 157/63 but on recheck was normotensive at 124/64.  Plan: -Continue verapamil 360 mg daily -Continue hydralazine 50 mg daily -The above medications were refilled

## 2020-03-01 NOTE — Assessment & Plan Note (Signed)
The patient has been following with [facility] over the last several months to monitor her CKD.  Although the patient's kidney function has stabilized, she is approaching ESRD and is discussing the need for dialysis.  She had an appointment with vascular surgery in May to discuss AV fistula placement.  It was determined that since she did not have an eminent need for access, they would defer until she actually needed dialysis.  Of note, the patient has been in touch with [facility] for consideration of kidney transplant.  She states that she is to attend a class tomorrow morning on what to expect in the criteria for receiving a kidney.  Plan: -BMP today -Continue to follow with nephrology

## 2020-03-07 ENCOUNTER — Encounter: Payer: Self-pay | Admitting: *Deleted

## 2020-03-10 NOTE — Progress Notes (Signed)
Internal Medicine Clinic Attending  Case discussed with Dr. Alexander at the time of the visit.  We reviewed the resident's history and exam and pertinent patient test results.  I agree with the assessment, diagnosis, and plan of care documented in the resident's note.  

## 2020-04-06 NOTE — Addendum Note (Signed)
Addended by: Hulan Fray on: 04/06/2020 02:58 PM   Modules accepted: Orders

## 2020-04-13 ENCOUNTER — Other Ambulatory Visit: Payer: Self-pay | Admitting: Internal Medicine

## 2020-04-13 DIAGNOSIS — I10 Essential (primary) hypertension: Secondary | ICD-10-CM

## 2020-04-20 DIAGNOSIS — E0590 Thyrotoxicosis, unspecified without thyrotoxic crisis or storm: Secondary | ICD-10-CM | POA: Diagnosis not present

## 2020-04-20 DIAGNOSIS — I120 Hypertensive chronic kidney disease with stage 5 chronic kidney disease or end stage renal disease: Secondary | ICD-10-CM | POA: Diagnosis not present

## 2020-04-20 DIAGNOSIS — N185 Chronic kidney disease, stage 5: Secondary | ICD-10-CM | POA: Diagnosis not present

## 2020-04-20 DIAGNOSIS — E1129 Type 2 diabetes mellitus with other diabetic kidney complication: Secondary | ICD-10-CM | POA: Diagnosis not present

## 2020-04-20 DIAGNOSIS — E611 Iron deficiency: Secondary | ICD-10-CM | POA: Diagnosis not present

## 2020-04-20 DIAGNOSIS — N2581 Secondary hyperparathyroidism of renal origin: Secondary | ICD-10-CM | POA: Diagnosis not present

## 2020-05-04 DIAGNOSIS — Z992 Dependence on renal dialysis: Secondary | ICD-10-CM | POA: Diagnosis not present

## 2020-05-04 DIAGNOSIS — N186 End stage renal disease: Secondary | ICD-10-CM | POA: Diagnosis not present

## 2020-05-08 ENCOUNTER — Other Ambulatory Visit: Payer: Self-pay

## 2020-05-08 DIAGNOSIS — I129 Hypertensive chronic kidney disease with stage 1 through stage 4 chronic kidney disease, or unspecified chronic kidney disease: Secondary | ICD-10-CM | POA: Insufficient documentation

## 2020-05-08 DIAGNOSIS — E05 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: Secondary | ICD-10-CM | POA: Insufficient documentation

## 2020-05-08 DIAGNOSIS — C140 Malignant neoplasm of pharynx, unspecified: Secondary | ICD-10-CM | POA: Insufficient documentation

## 2020-05-08 DIAGNOSIS — E871 Hypo-osmolality and hyponatremia: Secondary | ICD-10-CM | POA: Insufficient documentation

## 2020-05-08 DIAGNOSIS — N2581 Secondary hyperparathyroidism of renal origin: Secondary | ICD-10-CM | POA: Insufficient documentation

## 2020-05-08 DIAGNOSIS — N181 Chronic kidney disease, stage 1: Secondary | ICD-10-CM

## 2020-05-08 DIAGNOSIS — E611 Iron deficiency: Secondary | ICD-10-CM | POA: Insufficient documentation

## 2020-05-08 HISTORY — DX: 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: E05.00

## 2020-05-09 DIAGNOSIS — T782XXA Anaphylactic shock, unspecified, initial encounter: Secondary | ICD-10-CM | POA: Insufficient documentation

## 2020-05-09 DIAGNOSIS — L299 Pruritus, unspecified: Secondary | ICD-10-CM | POA: Insufficient documentation

## 2020-05-09 DIAGNOSIS — R0602 Shortness of breath: Secondary | ICD-10-CM | POA: Insufficient documentation

## 2020-05-09 DIAGNOSIS — T7840XA Allergy, unspecified, initial encounter: Secondary | ICD-10-CM | POA: Insufficient documentation

## 2020-05-09 DIAGNOSIS — T829XXA Unspecified complication of cardiac and vascular prosthetic device, implant and graft, initial encounter: Secondary | ICD-10-CM | POA: Insufficient documentation

## 2020-05-09 DIAGNOSIS — Z992 Dependence on renal dialysis: Secondary | ICD-10-CM | POA: Insufficient documentation

## 2020-05-09 HISTORY — DX: Anaphylactic shock, unspecified, initial encounter: T78.2XXA

## 2020-05-11 ENCOUNTER — Other Ambulatory Visit (HOSPITAL_COMMUNITY): Payer: Self-pay | Admitting: Nephrology

## 2020-05-11 DIAGNOSIS — N186 End stage renal disease: Secondary | ICD-10-CM

## 2020-05-12 ENCOUNTER — Other Ambulatory Visit: Payer: Self-pay | Admitting: Radiology

## 2020-05-12 DIAGNOSIS — D631 Anemia in chronic kidney disease: Secondary | ICD-10-CM | POA: Insufficient documentation

## 2020-05-12 DIAGNOSIS — D509 Iron deficiency anemia, unspecified: Secondary | ICD-10-CM | POA: Insufficient documentation

## 2020-05-15 ENCOUNTER — Other Ambulatory Visit (HOSPITAL_COMMUNITY): Payer: Self-pay | Admitting: Nephrology

## 2020-05-15 ENCOUNTER — Other Ambulatory Visit: Payer: Self-pay

## 2020-05-15 ENCOUNTER — Ambulatory Visit (HOSPITAL_COMMUNITY)
Admission: RE | Admit: 2020-05-15 | Discharge: 2020-05-15 | Disposition: A | Discharge status: Home / Self Care | Payer: Medicaid Other | Source: Ambulatory Visit | Attending: Nephrology | Admitting: Nephrology

## 2020-05-15 DIAGNOSIS — T82528A Displacement of other cardiac and vascular devices and implants, initial encounter: Secondary | ICD-10-CM | POA: Diagnosis not present

## 2020-05-15 DIAGNOSIS — N19 Unspecified kidney failure: Secondary | ICD-10-CM | POA: Diagnosis not present

## 2020-05-15 DIAGNOSIS — N186 End stage renal disease: Secondary | ICD-10-CM | POA: Diagnosis not present

## 2020-05-15 DIAGNOSIS — Z992 Dependence on renal dialysis: Secondary | ICD-10-CM

## 2020-05-15 DIAGNOSIS — Z4901 Encounter for fitting and adjustment of extracorporeal dialysis catheter: Secondary | ICD-10-CM | POA: Diagnosis not present

## 2020-05-15 DIAGNOSIS — T85691A Other mechanical complication of intraperitoneal dialysis catheter, initial encounter: Secondary | ICD-10-CM | POA: Insufficient documentation

## 2020-05-15 HISTORY — PX: IR US GUIDE VASC ACCESS LEFT: IMG2389

## 2020-05-15 HISTORY — PX: IR FLUORO GUIDE CV LINE LEFT: IMG2282

## 2020-05-15 HISTORY — PX: IR REMOVAL TUN CV CATH W/O FL: IMG2289

## 2020-05-15 MED ORDER — LIDOCAINE HCL (PF) 1 % IJ SOLN
INTRAMUSCULAR | Status: AC | PRN
  Administered 2020-05-15: 10 mL

## 2020-05-15 MED ORDER — LIDOCAINE HCL 1 % IJ SOLN
INTRAMUSCULAR | Status: AC
  Filled 2020-05-15: qty 20

## 2020-05-15 MED ORDER — CHLORHEXIDINE GLUCONATE 4 % EX LIQD
CUTANEOUS | Status: AC
  Filled 2020-05-15: qty 15

## 2020-05-15 MED ORDER — VANCOMYCIN HCL IN DEXTROSE 1-5 GM/200ML-% IV SOLN
INTRAVENOUS | Status: AC
  Filled 2020-05-15: qty 200

## 2020-05-15 MED ORDER — LIDOCAINE-EPINEPHRINE 1 %-1:100000 IJ SOLN
INTRAMUSCULAR | Status: AC
  Filled 2020-05-15: qty 1

## 2020-05-15 MED ORDER — VANCOMYCIN HCL 500 MG/100ML IV SOLN
500.0000 mg | Freq: Once | INTRAVENOUS | Status: AC
  Administered 2020-05-15: 500 mg via INTRAVENOUS
  Filled 2020-05-15 (×2): qty 100

## 2020-05-15 MED ORDER — HEPARIN SODIUM (PORCINE) 1000 UNIT/ML IJ SOLN
INTRAMUSCULAR | Status: AC
  Filled 2020-05-15: qty 1

## 2020-05-15 NOTE — Procedures (Signed)
  Procedure: L tunneled IJ HD catheter placement   EBL:   minimal Complications:  none immediate  See full dictation in [facility].  Dillard Cannon MD Main # [phone] Pager  [phone]

## 2020-05-15 NOTE — Progress Notes (Signed)
Patient presents for HD catheter exchange.  Reports she underwent HD catheter placement with Vascular 2 weeks ago and was able to receive 1 session of HD.  At her next session, dialysis was unsuccessful.  She reports she has an appointment for HD tomorrow.  She denies concerns or complaints today.  States she is in her usual state of health.  Vanc to be administered for exchange today. Patient aware of plans for local anesthetic only.   Risks and benefits discussed with the patient including, but not limited to bleeding, infection, vascular injury, pneumothorax which may require chest tube placement, air embolism or even death  All of the patient's questions were answered, patient is agreeable to proceed. Consent signed and in chart.  Brynda Greathouse, MS RD PA-C 10:48 AM

## 2020-05-15 NOTE — Progress Notes (Signed)
PHARMACY NOTE:  ANTIMICROBIAL RENAL DOSAGE ADJUSTMENT  Current antimicrobial regimen includes a mismatch between antimicrobial dosage and estimated renal function.  As per policy approved by the Pharmacy & Therapeutics and Medical Executive Committees, the antimicrobial dosage will be adjusted accordingly.  Current antimicrobial dosage:  Vancomycin 1000mg  once pre-procedure  Indication: prophlaxis  Renal Function:  CrCl ~ 11 ml/min (last weight 103lb on 02/29/20, last Scr 3.61 on 02/29/20. Baseline Scr ~3)     Antimicrobial dosage has been changed to:  Vancomycin 500mg  once   Additional comments: Please call pharmacy if need a dose [phone]   Thank you for allowing pharmacy to be a part of this patient's care.   Benetta Spar, PharmD, BCPS, BCCP Clinical Pharmacist  Please check AMION for all [place] phone numbers After 10:00 PM, call [place] [phone]

## 2020-05-16 DIAGNOSIS — E441 Mild protein-calorie malnutrition: Secondary | ICD-10-CM | POA: Insufficient documentation

## 2020-05-22 ENCOUNTER — Encounter (HOSPITAL_COMMUNITY): Payer: Medicaid Other

## 2020-05-22 ENCOUNTER — Other Ambulatory Visit (HOSPITAL_COMMUNITY): Payer: Medicaid Other

## 2020-05-23 ENCOUNTER — Other Ambulatory Visit: Payer: Self-pay | Admitting: *Deleted

## 2020-05-23 ENCOUNTER — Ambulatory Visit (INDEPENDENT_AMBULATORY_CARE_PROVIDER_SITE_OTHER): Payer: Medicaid Other | Admitting: Vascular Surgery

## 2020-05-23 ENCOUNTER — Encounter: Payer: Self-pay | Admitting: Vascular Surgery

## 2020-05-23 ENCOUNTER — Other Ambulatory Visit: Payer: Self-pay

## 2020-05-23 ENCOUNTER — Encounter: Payer: Self-pay | Admitting: *Deleted

## 2020-05-23 DIAGNOSIS — N186 End stage renal disease: Secondary | ICD-10-CM | POA: Diagnosis not present

## 2020-05-23 NOTE — Progress Notes (Signed)
Patient name: Susan Evans MRN: [id number] DOB: 05-18-1956 Sex: female  REASON FOR VISIT: Discuss permanent hemodialysis access  HPI: Susan Evans is a 64 y.o. 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  Patient was previously evaluated by my partner Dr. Donnetta Hutching in May 2021.  At that time she had no surface veins and the plan was left arm AV graft with the referral from [place] stating wait on AV graft.  She comes in today and now has a left IJ tunneled catheter and states she has started dialysis last week.  She is on dialysis Tuesday Thursdays Saturday.  Previously plan was left arm AV graft when able.  She reports no upper extremity access.  She is right-handed.  No other chest wall implants other than left IJ tunneled catheter.    Past Medical History:  Diagnosis Date  . Chronic kidney disease   . Diabetes mellitus   . Ganglion cyst    right wrist  . GERD (gastroesophageal reflux disease)   . History of radiation therapy 02/27/10 -04/13/10   left base of tongue, bilat neck  . Hyperlipidemia   . Hypertension   . Iron deficiency anemia   . Malignant neoplasm ([place]) 04/11   oropharyngeal carcinoma  . Menopause   . Thyroid disease    Hyperthyroidism  . Tobacco abuse    PT HAS QUIT    Past Surgical History:  Procedure Laterality Date  . IR FLUORO GUIDE CV LINE LEFT  05/15/2020  . IR REMOVAL TUN CV CATH W/O FL  05/15/2020  . IR US GUIDE VASC ACCESS LEFT  05/15/2020  . NECK SURGERY    . REDUCTION MAMMAPLASTY      Family History  Problem Relation Age of Onset  . Stroke Father   . Cancer Father        unknown ca  . Hypertension Mother   . Diabetes Mother   . Chronic Renal Failure Sister   . Chronic Renal Failure Brother   . Cancer Paternal Aunt        colon ca  . Breast cancer Neg Hx     SOCIAL HISTORY: Social History    Tobacco Use  . Smoking status: Former Smoker    Packs/day: 0.30    Quit date: 02/09/2009    Years since quitting: 11.2  . Smokeless tobacco: Never Used  . Tobacco comment: 1/3 pk every 3 days  Substance Use Topics  . Alcohol use: No    Alcohol/week: 0.0 standard drinks    Allergies  Allergen Reactions  . Hydrochlorothiazide Itching and Other (See Comments)    Hyponatremia   . Penicillins Other (See Comments)    "takes all of her hair out" .Has patient had a PCN reaction causing immediate rash, facial/tongue/throat swelling, SOB or lightheadedness with hypotension: No Has patient had a PCN reaction causing severe rash involving mucus membranes or skin necrosis: No Has patient had a PCN reaction that required hospitalization No Has patient had a PCN reaction occurring within the last 10 years: No If all of the above answers are "NO", then may proceed with Cephalosporin use.     Current Outpatient Medications  Medication Sig Dispense Refill  . Accu-Chek FastClix Lancets MISC 5 (FIVE) TIMES DAILY DIAGNOSIS CODE E11.9 204 each 8  . atorvastatin (LIPITOR) 40 MG tablet Take 1 tablet (40 mg total) by mouth daily. 90 tablet  3  . Blood Glucose Monitoring Suppl (ACCU-CHEK AVIVA PLUS) w/Device KIT Use to test blood glucose  3 times daily 1 kit 0  . calcium-vitamin D (CALCIUM 500+D HIGH POTENCY) 500-400 MG-UNIT tablet Take 1 tablet by mouth daily. 90 tablet 3  . CVS D3 50 MCG (2000 UT) CAPS TAKE 1 CAPSULE BY MOUTH EVERY DAY (Patient taking differently: Take 2,000 Units by mouth daily. ) 90 capsule 3  . glucose blood (ACCU-CHEK GUIDE) test strip 5 (five) times daily Diagnosis code E11.9 150 each 12  . hydrALAZINE (APRESOLINE) 50 MG tablet Take 1 tablet (50 mg total) by mouth 2 (two) times daily. 60 tablet 3  . insulin aspart (NOVOLOG FLEXPEN) 100 UNIT/ML FlexPen INJECT 7 UNITS WITH BREAKFAST, 10 WITH LUNCH, AND 8 WITH DINNER 15 mL 6  . insulin glargine (LANTUS) 100 UNIT/ML injection INJECT  0.2ML INTO THE SKIN TWICE A DAY, 15 UNITS IN AM AND 15 UNITS IN EVENING 20 mL 4  . Insulin Pen Needle (B-D UF III MINI PEN NEEDLES) 31G X 5 MM MISC USE TO INJECT NOVOLOG 3 TIMES A DAY. DIAGNOSIS CODE E11.9, Z79.4 100 each 8  . Insulin Syringe-Needle U-100 31G X 15/64" 0.3 ML MISC USE TO INJECT LANTUS INSULIN TWO TIMES A DAY 180 each 3  . loratadine (CLARITIN) 10 MG tablet Take 1 tablet (10 mg total) by mouth daily. 30 tablet 2  . omeprazole (PRILOSEC) 20 MG capsule Take 1 capsule (20 mg total) by mouth daily. 90 capsule 0  . Polyethyl Glycol-Propyl Glycol (SYSTANE OP) Apply 1 drop to eye daily as needed (for dry eyes).    . verapamil (CALAN-SR) 180 MG CR tablet TAKE 2 TABLETS (360 MG TOTAL) BY MOUTH AT BEDTIME. 60 tablet 3  . furosemide (LASIX) 20 MG tablet Take 2 tablets (40 mg total) by mouth 2 (two) times daily. (Patient not taking: Reported on 05/15/2020) 360 tablet 1   No current facility-administered medications for this visit.    REVIEW OF SYSTEMS:  [X]  denotes positive finding, [ ]  denotes negative finding Cardiac  Comments:  Chest pain or chest pressure:    Shortness of breath upon exertion:    Short of breath when lying flat:    Irregular heart rhythm:        Vascular    Pain in calf, thigh, or hip brought on by ambulation:    Pain in feet at night that wakes you up from your sleep:     Blood clot in your veins:    Leg swelling:         Pulmonary    Oxygen at home:    Productive cough:     Wheezing:         Neurologic    Sudden weakness in arms or legs:     Sudden numbness in arms or legs:     Sudden onset of difficulty speaking or slurred speech:    Temporary loss of vision in one eye:     Problems with dizziness:         Gastrointestinal    Blood in stool:     Vomited blood:         Genitourinary    Burning when urinating:     Blood in urine:        Psychiatric    Major depression:         Hematologic    Bleeding problems:    Problems with blood clotting  too easily:  Skin    Rashes or ulcers:        Constitutional    Fever or chills:      PHYSICAL EXAM: Vitals:   05/23/20 1045  BP: (!) 164/76  Pulse: (!) 107  Resp: 18  Temp: 98.1 F (36.7 C)  TempSrc: Temporal  SpO2: 98%  Weight: 94 lb (42.6 kg)  Height: 5' 2"  (1.575 m)    GENERAL: The patient is a well-nourished female, in no acute distress. The vital signs are documented above. CARDIAC: There is a regular rate and rhythm.  VASCULAR:  Palpable radial brachial pulses bilateral upper extremities No upper extremity tissue loss LIJ tunneled catheter PULMONARY: There is good air exchange bilaterally without wheezing or rales. ABDOMEN: Soft and non-tender with normal pitched bowel sounds.  MUSCULOSKELETAL: There are no major deformities or cyanosis. NEUROLOGIC: No focal weakness or paresthesias are detected. SKIN: There are no ulcers or rashes noted. PSYCHIATRIC: The patient has a normal affect.  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  She has a left IJ tunneled catheter that in place and she is now dialyzing on Tuesday Thursday Saturday.  I reviewed with her findings from her previous vein mapping in May when she saw Dr. Donnetta Hutching and she had no surface veins.  The plan at that time was a left arm AV graft when appropriate from [facility].  Given that she has now progressed to ESRD per the referral from Dr. Hollie Salk we will plan to proceed with scheduling her left arm AV graft.  I discussed risk and benefits including risk of bleeding, infection, steal, failure to mature.  We will get her scheduled on non-dialysis day.  She does not need a TDC at this time given she has a left IJ catheter placed at the hospital that is working without issue.   Marty Heck, MD [facility] [place] Office: [phone]

## 2020-05-25 ENCOUNTER — Encounter: Payer: Medicaid Other | Admitting: Student

## 2020-05-25 ENCOUNTER — Telehealth: Payer: Self-pay | Admitting: *Deleted

## 2020-05-25 NOTE — Telephone Encounter (Signed)
Call to patient about missed appointment today.  Patient stated that she forgot to call to reschedule.  Has Dialysis on Tuesday, Thursday and Saturdays.  Rescheduled appointment for 06/09/2020 at 1:15 PM.  Sander Nephew, RN 05/25/2020 10:40 AM.

## 2020-05-25 NOTE — Progress Notes (Deleted)
   CC: ***  HPI:  [name] is a 64 y.o. 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 Their last clinic visit was 3 months ago on 02/29/20.   To see the details of this patient's management of their acute and chronic problems, please refer to the A&P under the Encounters tab.    Past Medical History:  Diagnosis Date  . Chronic kidney disease   . Diabetes mellitus   . Ganglion cyst    right wrist  . GERD (gastroesophageal reflux disease)   . History of radiation therapy 02/27/10 -04/13/10   left base of tongue, bilat neck  . Hyperlipidemia   . Hypertension   . Iron deficiency anemia   . Malignant neoplasm ([place]) 04/11   oropharyngeal carcinoma  . Menopause   . Thyroid disease    Hyperthyroidism  . Tobacco abuse    PT HAS QUIT   Review of Systems:    ROS   Physical Exam:  There were no vitals filed for this visit. Constitutional: HENT: Eyes: Neck:  Cardiovascular: Pulmonary/Chest: Abdominal: GU/Anorectal: MSK: Neurological: Skin: Psych:   Assessment & Plan:   See Encounters Tab for problem based charting.  Patient {GC/GE:[id number]::"discussed with","seen with"} Dr. {NAMES:[id number]::"Butcher","Guilloud","Hoffman","Mullen","Narendra","Raines","Vincent"}

## 2020-05-26 ENCOUNTER — Ambulatory Visit (HOSPITAL_COMMUNITY)
Admission: RE | Admit: 2020-05-26 | Discharge: 2020-05-26 | Disposition: A | Discharge status: Home / Self Care | Payer: Medicaid Other | Source: Ambulatory Visit | Attending: Internal Medicine | Admitting: Internal Medicine

## 2020-05-26 ENCOUNTER — Other Ambulatory Visit: Payer: Self-pay

## 2020-05-26 DIAGNOSIS — D631 Anemia in chronic kidney disease: Secondary | ICD-10-CM | POA: Diagnosis not present

## 2020-05-26 DIAGNOSIS — N186 End stage renal disease: Secondary | ICD-10-CM | POA: Diagnosis present

## 2020-05-26 LAB — ABO/RH: ABO/RH(D): A POS

## 2020-05-26 LAB — PREPARE RBC (CROSSMATCH)

## 2020-05-26 MED ORDER — SODIUM CHLORIDE 0.9% IV SOLUTION: Freq: Once | INTRAVENOUS | Status: AC

## 2020-05-26 MED ORDER — ACETAMINOPHEN 325 MG PO TABS
325.0000 mg | ORAL_TABLET | Freq: Once | ORAL | Status: AC
  Administered 2020-05-26: 325 mg via ORAL
  Filled 2020-05-26: qty 1

## 2020-05-26 MED ORDER — DIPHENHYDRAMINE HCL 25 MG PO CAPS
25.0000 mg | ORAL_CAPSULE | Freq: Once | ORAL | Status: AC
  Administered 2020-05-26: 25 mg via ORAL
  Filled 2020-05-26: qty 1

## 2020-05-26 NOTE — Discharge Instructions (Signed)

## 2020-05-26 NOTE — Progress Notes (Signed)
PATIENT CARE CENTER NOTE  Diagnosis: ESRD; anemia    Provider: Madelon Lips, MD   Procedure: 1 unit PRBC transfusion   Note: Type and screen drawn and patient received transfusion of 1 unit PRBC. Pre-medications (Tylenol and Benadryl) given before transfusion per order. Patient tolerated transfusion well with no adverse reaction. Vital signs stable. Discharge instructions given. Patient alert, oriented and ambulatory at discharge.

## 2020-05-27 LAB — BPAM RBC
Blood Product Expiration Date: 202109252359
ISSUE DATE / TIME: 202109031314
Unit Type and Rh: 6200

## 2020-05-27 LAB — TYPE AND SCREEN
ABO/RH(D): A POS
Antibody Screen: NEGATIVE
Unit division: 0

## 2020-05-31 ENCOUNTER — Other Ambulatory Visit (HOSPITAL_COMMUNITY)
Admission: RE | Admit: 2020-05-31 | Discharge: 2020-05-31 | Disposition: A | Discharge status: Home / Self Care | Payer: Medicaid Other | Source: Ambulatory Visit | Attending: Vascular Surgery | Admitting: Vascular Surgery

## 2020-05-31 DIAGNOSIS — Z20822 Contact with and (suspected) exposure to covid-19: Secondary | ICD-10-CM | POA: Diagnosis not present

## 2020-05-31 DIAGNOSIS — Z01812 Encounter for preprocedural laboratory examination: Secondary | ICD-10-CM | POA: Insufficient documentation

## 2020-05-31 LAB — SARS CORONAVIRUS 2 (TAT 6-24 HRS): SARS Coronavirus 2: NEGATIVE

## 2020-06-01 ENCOUNTER — Other Ambulatory Visit: Payer: Self-pay

## 2020-06-01 ENCOUNTER — Encounter (HOSPITAL_COMMUNITY): Payer: Self-pay | Admitting: Vascular Surgery

## 2020-06-01 NOTE — Progress Notes (Signed)
Anesthesia Chart Review: Same day workup  ESRD recently started on HD via left IJ tunneled catheter Tuesday Thursdays Saturday.  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 She had a benign lesion excised from the petiole of the epiglottis in August 2015. Last seen by Dr. Erik Obey 0/09/7492, fiberoptic laryngoscopy performed with no evidence of disease. Results below.  Will need DOS labs and eval.  Fiberoptic Laryngoscopy 04/29/17. Technique: After anesthetizing nasal cavity with topical lidocaine and oxymetazoline, I inserted the endoscope passing it through the nasal cavity into the nasopharynx, then behind the soft palate down to the oropharynx. The lateral pharyngeal walls appeared healthy. There were no worrisome mucosal masses or lesions seen here. The epiglottis has healed without suspicious lesions. The vocal folds were healthy, airway was patent but she does have floppy arytenoid edema. At this point, the endoscope was removed from her nose. She tolerated the procedure well.   EKG 08/12/19: Right and left arm electrode reversal, interpretation assumes no reversal. Rate 103. Sinus tachycardia. Consider left atrial enlargement. Probable lateral infarct, age indeterminate. No significant change since last tracing  TTE 11/21/15: - Left ventricle: The cavity size was normal. Wall thickness was  normal. Systolic function was normal. The estimated ejection  fraction was in the range of 60% to 65%. Wall motion was normal;  there were no regional wall motion abnormalities.  - Aortic valve: There was mild regurgitation.  - Left atrium: The atrium was mildly dilated.  - Atrial septum: No defect or patent foramen ovale was identified.    Wynonia Musty [facility] Short Stay Center/Anesthesiology Phone [phone] 06/01/2020 4:52 PM

## 2020-06-01 NOTE — Progress Notes (Signed)
Spoke with pt for pre-op call. Pt denies cardiac history. Pt is a type 2 diabetic. Last A1C was 6.4 on 02/29/20. Pt states her fasting blood sugar is usually between 140-195. Instructed pt to take 1/2 of her Lantus insulin dose this evening and again in the AM. She will take 7 units each time. Instructed pt to check her blood sugar when she gets up in the AM and every 2 hours until she leaves for the hospital. If blood sugar is >220 take 1/2 of usual correction dose of Novolog insulin. If blood sugar is 70 or below, treat with 1/2 cup of clear juice (apple or cranberry) and recheck blood sugar 15 minutes after drinking juice. If blood sugar continues to be 70 or below, call the Short Stay department and ask to speak to a nurse. Pt voiced understanding.  Covid test done on 05/31/20 and it's negative. Pt states she has been in quarantine except for Dialysis since test was done and she understands she stays in quarantine until she comes to the hospital.

## 2020-06-01 NOTE — Anesthesia Preprocedure Evaluation (Addendum)
Anesthesia Evaluation  Patient identified by MRN, date of birth, ID band Patient awake    Reviewed: Allergy & Precautions, NPO status , Patient's Chart, lab work & pertinent test results  Airway Mallampati: III  TM Distance: >3 FB Neck ROM: Full    Dental  (+) Dental Advisory Given, Edentulous Upper, Edentulous Lower   Pulmonary neg pulmonary ROS, former smoker,    Pulmonary exam normal breath sounds clear to auscultation       Cardiovascular hypertension, Pt. on medications Normal cardiovascular exam Rhythm:Regular Rate:Normal     Neuro/Psych negative neurological ROS     GI/Hepatic Neg liver ROS, GERD  ,  Endo/Other  diabetesHyperthyroidism   Renal/GU Renal disease     Musculoskeletal negative musculoskeletal ROS (+)   Abdominal (+) - obese,   Peds  Hematology  (+) Blood dyscrasia, anemia ,   Anesthesia Other Findings   Reproductive/Obstetrics                           Anesthesia Physical Anesthesia Plan  ASA: III  Anesthesia Plan: MAC   Post-op Pain Management:    Induction: Intravenous  PONV Risk Score and Plan: 2 and Ondansetron, Treatment may vary due to age or medical condition, Propofol infusion and TIVA  Airway Management Planned: Natural Airway  Additional Equipment:   Intra-op Plan:   Post-operative Plan:   Informed Consent: I have reviewed the patients History and Physical, chart, labs and discussed the procedure including the risks, benefits and alternatives for the proposed anesthesia with the patient or authorized representative who has indicated his/her understanding and acceptance.     Dental advisory given  Plan Discussed with: CRNA  Anesthesia Plan Comments: (ESRD recently started on HD via left IJ tunneled catheter Tuesday Thursdays Saturday.  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 She had a benign lesion excised from the petiole of the epiglottis in August 2015. Last seen by Dr. Erik Obey 12/26/8183, fiberoptic laryngoscopy performed with no evidence of disease. Results below.  Will need DOS labs and eval.  Procedure: Fiberoptic Laryngoscopy. Technique: After anesthetizing nasal cavity with topical lidocaine and oxymetazoline, I inserted the endoscope passing it through the nasal cavity into the nasopharynx, then behind the soft palate down to the oropharynx. The lateral pharyngeal walls appeared healthy. There were no worrisome mucosal masses or lesions seen here. The epiglottis has healed without suspicious lesions. The vocal folds were healthy, airway was patent but she does have floppy arytenoid edema. At this point, the endoscope was removed from her nose. She tolerated the procedure well.   EKG 08/12/19: Right and left arm electrode reversal, interpretation assumes no reversal. Rate 103. Sinus tachycardia. Consider left atrial enlargement. Probable lateral infarct, age indeterminate. No significant change since last tracing  TTE 11/21/15: - Left ventricle: The cavity size was normal. Wall thickness was  normal. Systolic function was normal. The estimated ejection  fraction was in the range of 60% to 65%. Wall motion was normal;  there were no regional wall motion abnormalities.  - Aortic valve: There was mild regurgitation.  - Left atrium: The atrium was mildly dilated.  - Atrial septum: No defect or patent foramen ovale was identified.   )    Anesthesia Quick Evaluation

## 2020-06-02 ENCOUNTER — Encounter (HOSPITAL_COMMUNITY): Payer: Self-pay | Admitting: Vascular Surgery

## 2020-06-02 ENCOUNTER — Ambulatory Visit (HOSPITAL_COMMUNITY)
Admission: RE | Admit: 2020-06-02 | Discharge: 2020-06-02 | Disposition: A | Discharge status: Home / Self Care | Payer: Medicaid Other | Attending: Vascular Surgery | Admitting: Vascular Surgery

## 2020-06-02 ENCOUNTER — Ambulatory Visit (HOSPITAL_COMMUNITY): Payer: Medicaid Other | Admitting: Physician Assistant

## 2020-06-02 ENCOUNTER — Encounter (HOSPITAL_COMMUNITY)
Admission: RE | Disposition: A | Discharge status: Home / Self Care | Payer: Self-pay | Source: Home / Self Care | Attending: Vascular Surgery

## 2020-06-02 DIAGNOSIS — Z85810 Personal history of malignant neoplasm of tongue: Secondary | ICD-10-CM | POA: Insufficient documentation

## 2020-06-02 DIAGNOSIS — Z880 Allergy status to penicillin: Secondary | ICD-10-CM | POA: Insufficient documentation

## 2020-06-02 DIAGNOSIS — D631 Anemia in chronic kidney disease: Secondary | ICD-10-CM | POA: Insufficient documentation

## 2020-06-02 DIAGNOSIS — K219 Gastro-esophageal reflux disease without esophagitis: Secondary | ICD-10-CM | POA: Diagnosis not present

## 2020-06-02 DIAGNOSIS — Z841 Family history of disorders of kidney and ureter: Secondary | ICD-10-CM | POA: Insufficient documentation

## 2020-06-02 DIAGNOSIS — E785 Hyperlipidemia, unspecified: Secondary | ICD-10-CM | POA: Diagnosis not present

## 2020-06-02 DIAGNOSIS — Z85818 Personal history of malignant neoplasm of other sites of lip, oral cavity, and pharynx: Secondary | ICD-10-CM | POA: Insufficient documentation

## 2020-06-02 DIAGNOSIS — Z87891 Personal history of nicotine dependence: Secondary | ICD-10-CM | POA: Insufficient documentation

## 2020-06-02 DIAGNOSIS — E1122 Type 2 diabetes mellitus with diabetic chronic kidney disease: Secondary | ICD-10-CM | POA: Insufficient documentation

## 2020-06-02 DIAGNOSIS — Z794 Long term (current) use of insulin: Secondary | ICD-10-CM | POA: Insufficient documentation

## 2020-06-02 DIAGNOSIS — Z8249 Family history of ischemic heart disease and other diseases of the circulatory system: Secondary | ICD-10-CM | POA: Diagnosis not present

## 2020-06-02 DIAGNOSIS — D509 Iron deficiency anemia, unspecified: Secondary | ICD-10-CM | POA: Diagnosis not present

## 2020-06-02 DIAGNOSIS — Z823 Family history of stroke: Secondary | ICD-10-CM | POA: Insufficient documentation

## 2020-06-02 DIAGNOSIS — E0590 Thyrotoxicosis, unspecified without thyrotoxic crisis or storm: Secondary | ICD-10-CM | POA: Diagnosis not present

## 2020-06-02 DIAGNOSIS — Z79899 Other long term (current) drug therapy: Secondary | ICD-10-CM | POA: Insufficient documentation

## 2020-06-02 DIAGNOSIS — Z992 Dependence on renal dialysis: Secondary | ICD-10-CM | POA: Diagnosis not present

## 2020-06-02 DIAGNOSIS — Z888 Allergy status to other drugs, medicaments and biological substances status: Secondary | ICD-10-CM | POA: Diagnosis not present

## 2020-06-02 DIAGNOSIS — Z923 Personal history of irradiation: Secondary | ICD-10-CM | POA: Insufficient documentation

## 2020-06-02 DIAGNOSIS — N186 End stage renal disease: Secondary | ICD-10-CM | POA: Insufficient documentation

## 2020-06-02 DIAGNOSIS — Z833 Family history of diabetes mellitus: Secondary | ICD-10-CM | POA: Insufficient documentation

## 2020-06-02 DIAGNOSIS — N185 Chronic kidney disease, stage 5: Secondary | ICD-10-CM | POA: Diagnosis not present

## 2020-06-02 DIAGNOSIS — Z809 Family history of malignant neoplasm, unspecified: Secondary | ICD-10-CM | POA: Diagnosis not present

## 2020-06-02 DIAGNOSIS — Z800 Family history of malignant neoplasm of digestive organs: Secondary | ICD-10-CM | POA: Diagnosis not present

## 2020-06-02 DIAGNOSIS — I120 Hypertensive chronic kidney disease with stage 5 chronic kidney disease or end stage renal disease: Secondary | ICD-10-CM | POA: Insufficient documentation

## 2020-06-02 HISTORY — PX: AV FISTULA PLACEMENT: SHX1204

## 2020-06-02 LAB — GLUCOSE, CAPILLARY
Glucose-Capillary: 225 mg/dL — ABNORMAL HIGH (ref 70–99)
Glucose-Capillary: 229 mg/dL — ABNORMAL HIGH (ref 70–99)
Glucose-Capillary: 208 mg/dL — ABNORMAL HIGH (ref 70–99)

## 2020-06-02 LAB — POCT I-STAT, CHEM 8
BUN: 20 mg/dL (ref 8–23)
Calcium, Ion: 1.03 mmol/L — ABNORMAL LOW (ref 1.15–1.40)
Chloride: 99 mmol/L (ref 98–111)
Creatinine, Ser: 2.5 mg/dL — ABNORMAL HIGH (ref 0.44–1.00)
Glucose, Bld: 211 mg/dL — ABNORMAL HIGH (ref 70–99)
HCT: 39 % (ref 36.0–46.0)
Hemoglobin: 13.3 g/dL (ref 12.0–15.0)
Potassium: 5.6 mmol/L — ABNORMAL HIGH (ref 3.5–5.1)
Sodium: 134 mmol/L — ABNORMAL LOW (ref 135–145)
TCO2: 26 mmol/L (ref 22–32)

## 2020-06-02 SURGERY — INSERTION OF ARTERIOVENOUS (AV) GORE-TEX GRAFT ARM
Anesthesia: Monitor Anesthesia Care | Site: Arm Upper | Laterality: Left

## 2020-06-02 MED ORDER — MIDAZOLAM HCL 5 MG/5ML IJ SOLN
INTRAMUSCULAR | Status: DC | PRN
  Administered 2020-06-02: .5 mg via INTRAVENOUS

## 2020-06-02 MED ORDER — HYDROCODONE-ACETAMINOPHEN 5-325 MG PO TABS
1.0000 | ORAL_TABLET | ORAL | 0 refills | Status: DC | PRN
Start: 2020-06-02 — End: 2020-12-08

## 2020-06-02 MED ORDER — SODIUM CHLORIDE 0.9 % IV SOLN
INTRAVENOUS | Status: AC
  Filled 2020-06-02: qty 1.2

## 2020-06-02 MED ORDER — CHLORHEXIDINE GLUCONATE 0.12 % MT SOLN
OROMUCOSAL | Status: AC
  Administered 2020-06-02: 15 mL
  Filled 2020-06-02: qty 15

## 2020-06-02 MED ORDER — ONDANSETRON HCL 4 MG/2ML IJ SOLN
INTRAMUSCULAR | Status: AC
  Filled 2020-06-02: qty 2

## 2020-06-02 MED ORDER — MIDAZOLAM HCL 2 MG/2ML IJ SOLN
INTRAMUSCULAR | Status: AC
  Filled 2020-06-02: qty 2

## 2020-06-02 MED ORDER — LIDOCAINE HCL 1 % IJ SOLN
INTRAMUSCULAR | Status: DC | PRN
  Administered 2020-06-02: 14 mL

## 2020-06-02 MED ORDER — VANCOMYCIN HCL IN DEXTROSE 1-5 GM/200ML-% IV SOLN
1000.0000 mg | INTRAVENOUS | Status: AC
  Administered 2020-06-02: 1000 mg via INTRAVENOUS
  Filled 2020-06-02: qty 200

## 2020-06-02 MED ORDER — PROPOFOL 1000 MG/100ML IV EMUL
INTRAVENOUS | Status: AC
  Filled 2020-06-02: qty 100

## 2020-06-02 MED ORDER — FENTANYL CITRATE (PF) 250 MCG/5ML IJ SOLN
INTRAMUSCULAR | Status: DC | PRN
  Administered 2020-06-02: 25 ug via INTRAVENOUS

## 2020-06-02 MED ORDER — PROPOFOL 10 MG/ML IV BOLUS
INTRAVENOUS | Status: AC
  Filled 2020-06-02: qty 20

## 2020-06-02 MED ORDER — HEPARIN SODIUM (PORCINE) 1000 UNIT/ML IJ SOLN
INTRAMUSCULAR | Status: DC | PRN
  Administered 2020-06-02 (×2): 3000 [IU] via INTRAVENOUS

## 2020-06-02 MED ORDER — CHLORHEXIDINE GLUCONATE 4 % EX LIQD: 60.0000 mL | Freq: Once | CUTANEOUS | Status: DC

## 2020-06-02 MED ORDER — LIDOCAINE HCL (PF) 1 % IJ SOLN
INTRAMUSCULAR | Status: AC
  Filled 2020-06-02: qty 30

## 2020-06-02 MED ORDER — FENTANYL CITRATE (PF) 250 MCG/5ML IJ SOLN
INTRAMUSCULAR | Status: AC
  Filled 2020-06-02: qty 5

## 2020-06-02 MED ORDER — LIDOCAINE HCL (CARDIAC) PF 100 MG/5ML IV SOSY
PREFILLED_SYRINGE | INTRAVENOUS | Status: DC | PRN
  Administered 2020-06-02: 40 mg via INTRATRACHEAL

## 2020-06-02 MED ORDER — SODIUM CHLORIDE 0.9 % IV SOLN: INTRAVENOUS | Status: DC

## 2020-06-02 MED ORDER — HEPARIN SODIUM (PORCINE) 1000 UNIT/ML IJ SOLN
INTRAMUSCULAR | Status: AC
  Filled 2020-06-02: qty 1

## 2020-06-02 MED ORDER — PROTAMINE SULFATE 10 MG/ML IV SOLN
INTRAVENOUS | Status: AC
  Filled 2020-06-02: qty 5

## 2020-06-02 MED ORDER — PROPOFOL 500 MG/50ML IV EMUL
INTRAVENOUS | Status: DC | PRN
  Administered 2020-06-02: 25 ug/kg/min via INTRAVENOUS

## 2020-06-02 MED ORDER — SODIUM CHLORIDE 0.9 % IV SOLN
INTRAVENOUS | Status: DC | PRN
  Administered 2020-06-02: 10:00:00 500 mL

## 2020-06-02 MED ORDER — 0.9 % SODIUM CHLORIDE (POUR BTL) OPTIME
TOPICAL | Status: DC | PRN
  Administered 2020-06-02: 1000 mL

## 2020-06-02 SURGICAL SUPPLY — 39 items
ADH SKN CLS APL DERMABOND .7 (GAUZE/BANDAGES/DRESSINGS) ×1
AGENT HMST SPONGE THK3/8 (HEMOSTASIS)
ARMBAND PINK RESTRICT EXTREMIT (MISCELLANEOUS) ×4 IMPLANT
BLADE CLIPPER SURG (BLADE) ×1 IMPLANT
CANISTER SUCT 3000ML PPV (MISCELLANEOUS) ×3 IMPLANT
CLIP VESOCCLUDE MED 6/CT (CLIP) ×3 IMPLANT
CLIP VESOCCLUDE SM WIDE 6/CT (CLIP) ×5 IMPLANT
COVER PROBE W GEL 5X96 (DRAPES) ×3 IMPLANT
COVER WAND RF STERILE (DRAPES) ×1 IMPLANT
DECANTER SPIKE VIAL GLASS SM (MISCELLANEOUS) ×1 IMPLANT
DERMABOND ADVANCED (GAUZE/BANDAGES/DRESSINGS) ×2
DERMABOND ADVANCED .7 DNX12 (GAUZE/BANDAGES/DRESSINGS) ×1 IMPLANT
ELECT REM PT RETURN 9FT ADLT (ELECTROSURGICAL) ×3
ELECTRODE REM PT RTRN 9FT ADLT (ELECTROSURGICAL) ×1 IMPLANT
GLOVE BIO SURGEON STRL SZ7.5 (GLOVE) ×3 IMPLANT
GLOVE BIOGEL PI IND STRL 8 (GLOVE) ×1 IMPLANT
GLOVE BIOGEL PI INDICATOR 8 (GLOVE) ×2
GOWN STRL REUS W/ TWL LRG LVL3 (GOWN DISPOSABLE) ×2 IMPLANT
GOWN STRL REUS W/ TWL XL LVL3 (GOWN DISPOSABLE) ×2 IMPLANT
GOWN STRL REUS W/TWL LRG LVL3 (GOWN DISPOSABLE) ×6
GOWN STRL REUS W/TWL XL LVL3 (GOWN DISPOSABLE) ×6
GRAFT GORETEX STRT 4-7X45 (Vascular Products) ×2 IMPLANT
HEMOSTAT SPONGE AVITENE ULTRA (HEMOSTASIS) IMPLANT
KIT BASIN OR (CUSTOM PROCEDURE TRAY) ×3 IMPLANT
KIT TURNOVER KIT B (KITS) ×3 IMPLANT
LOOP VESSEL MINI RED (MISCELLANEOUS) ×2 IMPLANT
NS IRRIG 1000ML POUR BTL (IV SOLUTION) ×3 IMPLANT
PACK CV ACCESS (CUSTOM PROCEDURE TRAY) ×3 IMPLANT
PAD ARMBOARD 7.5X6 YLW CONV (MISCELLANEOUS) ×6 IMPLANT
SUT GORETEX 6.0 TT13 (SUTURE) IMPLANT
SUT MNCRL AB 4-0 PS2 18 (SUTURE) ×3 IMPLANT
SUT PROLENE 6 0 BV (SUTURE) ×9 IMPLANT
SUT PROLENE 7 0 BV 1 (SUTURE) IMPLANT
SUT SILK 2 0 PERMA HAND 18 BK (SUTURE) ×2 IMPLANT
SUT VIC AB 3-0 SH 27 (SUTURE) ×9
SUT VIC AB 3-0 SH 27X BRD (SUTURE) ×2 IMPLANT
TOWEL GREEN STERILE (TOWEL DISPOSABLE) ×3 IMPLANT
UNDERPAD 30X36 HEAVY ABSORB (UNDERPADS AND DIAPERS) ×3 IMPLANT
WATER STERILE IRR 1000ML POUR (IV SOLUTION) ×3 IMPLANT

## 2020-06-02 NOTE — Op Note (Addendum)
OPERATIVE NOTE   PROCEDURE:  left upper arm arteriovenous graft (4 mm x 7 mm tapered graft)   PRE-OPERATIVE DIAGNOSIS: end stage renal disease   POST-OPERATIVE DIAGNOSIS: same  SURGEON: Marty Heck, MD  ASSISTANT(S): Risa Grill, PA  ANESTHESIA: MAC  ESTIMATED BLOOD LOSS: 75 mL  FINDING(S): Palpable thrill at end of case with dopplerable radial signal at end of case.  SPECIMEN(S):  None  INDICATIONS:   Susan Evans is a 64 y.o. female who presents with ESRD and need for permanent hemodialysis access.  She has no surface veins and plan for upper arm AV graft.  Risk, benefits, and alternatives to access surgery were discussed.  The patient is aware the risks include but are not limited to: bleeding, infection, steal syndrome, nerve damage, ischemic monomelic neuropathy, failure to mature, and need for additional procedures.  The patient is aware of the risks and elects to proceed forward.  An assistant was needed to expedite the case and for exposure.  DESCRIPTION: After full informed written consent was obtained from the patient, the patient was brought back to the operating room and placed supine upon the operating table.  The patient was given IV antibiotics prior to proceeding.  After obtaining adequate sedation, the patient was prepped and draped in standard fashion for a left arm access procedure.  I evaluated all of the surface veins with ultrasound and they were small as indicated on pre-operative vein mapping.  I turned my attention first to the antecubitum.  Under ultrasound guidance, I identified the location of the brachial artery and marked it on the skin.  I then looked on the upper arm near the axilla and marked the brachial vein as well as axillary vein. I injected 1% lidocaine without epinephrine at each location to obtain some anesthesia.  In total, I used 10 mL of lidocaine to obtain anesthesia.  I made a longitudinal incision over the brachial artery  above the antecubitum and another longitudinal incision over the brachial vein near the axillary vein.  I dissected down through the subcutaneous tissue and  fascia carefully and was able to dissect out the brachial artery.  The artery was about 3.0 mm externally.  It was controlled proximally and distally with vessel loops.  I then dissected out the larger brachial vein through the upper arm incision.  Externally, it appeared to be small and only about 2.5 mm in diameter.  I then dissected this vein proximally and distal.  II took a metal Gore tunneler and dissected from the antecubital incision to the axillary incision.  Then I delivered the 4 x 7-mm stretch Gore-Tex graft, through this metal tunneler and then pulled out the metal tunneler leaving the graft in place.  The 4-mm end was left on the brachial artery side and the 7-mm end toward the vein side.  I then gave the patient 3,000 units of IV heparin to gain anticoagulation.  After waiting 2 minutes, I placed the brachial artery under tension proximally and distally with vessel loops, made an arteriotomy and extended it with a Potts scissor.  I sewed the 4-mm end of the graft to this arteriotomy with a running stitch of 6-0 Prolene.  At this point, then I completed the anastomosis in the usual fashion.  I released the vessel loops on the inflow and allowed the artery to decompress through the graft. There was good pulsatile bleeding through this graft.  I clamped the graft near its arterial anastomosis and sucked  out all the blood in the graft and loaded the graft with heparinized saline.  At this point, I pulled the graft to appropriate length and reset my exposure of the high brachial vein. The vein was controlled with Henle clamps and opened with 11 blade scalpel and extended with Potts scissors.  This vein was very small. There was good venous backbleeding from the vein. I spatulated the graft to facilitate an end-to-side anastomosis.  In the process of  spatulating, I cut the graft to appropriate length for this anastomosis.  This graft was sewn to the vein in an end-to-side configuration with a 6-0 Prolene.  Prior to completing this anastomosis, I allowed the vein to back bleed and then I also allowed the artery to bleed in an antegrade fashion.  I completed this anastomosis in the usual fashion.    Ultimately I was unhappy with a weak thrill in the graft.  I thought this was related to the venous outflow given a very small vein.  I attempted to take down the anastomosis and passive dilator but it would not pass without tearing the vein.  I then tried to arrange an end-to-end anastomosis to this vein but again there was so mismatched that it was really not feasible.  I then dissected around the artery and found a second brachial vein that looked a little more robust.  I then controlled this with Henley clamps give the patient additional 3000 units of IV heparin.  A new into side anastomosis was created to this other brachial vein.  This vein had more robust outflow.  There was a much better thrill in the graft. I irrigated out both incisions.  I used surgicel snow to get hemostasis.   The graft had a good palpable thrill.  The radial artery had good signals.  The subcutaneous tissue in each incision was reapproximated with a running stitch of 3-0 Vicryl.  The skin was then reapproximated with a running subcuticular 4-0 Monocryl.  The skin was then cleaned, dried, and Dermabond used to reinforce the skin closure.   COMPLICATIONS: None  CONDITION: Stable   Marty Heck, MD [facility]: [phone]   06/02/2020, 11:43 AM

## 2020-06-02 NOTE — Transfer of Care (Signed)
Immediate Anesthesia Transfer of Care Note  Patient: Susan Evans  Procedure(s) Performed: INSERTION OF ARTERIOVENOUS (AV) GORE-TEX GRAFT ARM LEFT (Left Arm Upper)  Patient Location: PACU  Anesthesia Type:MAC  Level of Consciousness: awake, alert  and oriented  Airway & Oxygen Therapy: Patient Spontanous Breathing  Post-op Assessment: Report given to RN, Post -op Vital signs reviewed and stable and Patient moving all extremities X 4  Post vital signs: Reviewed and stable  Last Vitals:  Vitals Value Taken Time  BP    Temp    Pulse    Resp    SpO2      Last Pain:  Vitals:   06/02/20 0751  TempSrc:   PainSc: 0-No pain         Complications: No complications documented.

## 2020-06-02 NOTE — Discharge Instructions (Signed)
° °  [facility] ° °Discharge Instructions ° °AV Fistula or Graft Surgery for Dialysis Access ° °Please refer to the following instructions for your post-procedure care. Your surgeon or physician assistant will discuss any changes with you. ° °Activity ° °You may drive the day following your surgery, if you are comfortable and no longer taking prescription pain medication. Resume full activity as the soreness in your incision resolves. ° °Bathing/Showering ° °You may shower after you go home. Keep your incision dry for 48 hours. Do not soak in a bathtub, hot tub, or swim until the incision heals completely. You may not shower if you have a hemodialysis catheter. ° °Incision Care ° °Clean your incision with mild soap and water after 48 hours. Pat the area dry with a clean towel. You do not need a bandage unless otherwise instructed. Do not apply any ointments or creams to your incision. You may have skin glue on your incision. Do not peel it off. It will come off on its own in about one week. Your arm may swell a bit after surgery. To reduce swelling use pillows to elevate your arm so it is above your heart. Your doctor will tell you if you need to lightly wrap your arm with an ACE bandage. ° °Diet ° °Resume your normal diet. There are not special food restrictions following this procedure. In order to heal from your surgery, it is CRITICAL to get adequate nutrition. Your body requires vitamins, minerals, and protein. Vegetables are the best source of vitamins and minerals. Vegetables also provide the perfect balance of protein. Processed food has little nutritional value, so try to avoid this. ° °Medications ° °Resume taking all of your medications. If your incision is causing pain, you may take over-the counter pain relievers such as acetaminophen (Tylenol). If you were prescribed a stronger pain medication, please be aware these medications can cause nausea and constipation. Prevent  nausea by taking the medication with a snack or meal. Avoid constipation by drinking plenty of fluids and eating foods with high amount of fiber, such as fruits, vegetables, and grains. Do not take Tylenol if you are taking prescription pain medications. ° ° ° ° °Follow up °Your surgeon may want to see you in the office following your access surgery. If so, this will be arranged at the time of your surgery. ° °Please call us immediately for any of the following conditions: ° °Increased pain, redness, drainage (pus) from your incision site °Fever of 101 degrees or higher °Severe or worsening pain at your incision site °Hand pain or numbness. ° °Reduce your risk of vascular disease: ° °Stop smoking. If you would like help, call [facility] at 1-800-QUIT-NOW (1-800-784-8669) or [facility] at 336-586-4000 ° °Manage your cholesterol °Maintain a desired weight °Control your diabetes °Keep your blood pressure down ° °Dialysis ° °It will take several weeks to several months for your new dialysis access to be ready for use. Your surgeon will determine when it is OK to use it. Your nephrologist will continue to direct your dialysis. You can continue to use your Permcath until your new access is ready for use. ° °If you have any questions, please call the office at [phone]. ° °

## 2020-06-02 NOTE — H&P (Signed)
History and Physical Interval Note:  06/02/2020 9:34 AM  Susan Evans  has presented today for surgery, with the diagnosis of esrd.  The various methods of treatment have been discussed with the patient and family. After consideration of risks, benefits and other options for treatment, the patient has consented to  Procedure(s): INSERTION OF ARTERIOVENOUS (AV) GORE-TEX GRAFT ARM LEFT (Left) as a surgical intervention.  The patient's history has been reviewed, patient examined, no change in status, stable for surgery.  I have reviewed the patient's chart and labs.  Questions were answered to the patient's satisfaction.    Left arm AVG, no surface veins.  Marty Heck  Patient name: Susan Evans      MRN: [id number]        DOB: May 16, 1956        Sex: female  REASON FOR VISIT: Discuss permanent hemodialysis access  HPI: Susan Evans is a 64 y.o. 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  Patient was previously evaluated by my partner Dr. Donnetta Hutching in May 2021.  At that time she had no surface veins and the plan was left arm AV graft with the referral from [facility] stating wait on AV graft.  She comes in today and now has a left IJ tunneled catheter and states she has started dialysis last week.  She is on dialysis Tuesday Thursdays Saturday.  Previously plan was left arm AV graft when able.  She reports no upper extremity access.  She is right-handed.  No other chest wall implants other than left IJ tunneled catheter.        Past Medical History:  Diagnosis Date  . Chronic kidney disease   . Diabetes mellitus   . Ganglion cyst    right wrist  . GERD (gastroesophageal reflux disease)   . History of radiation therapy 02/27/10 -04/13/10   left base of tongue, bilat neck  . Hyperlipidemia   . Hypertension   . Iron deficiency  anemia   . Malignant neoplasm ([place]) 04/11   oropharyngeal carcinoma  . Menopause   . Thyroid disease    Hyperthyroidism  . Tobacco abuse    PT HAS QUIT         Past Surgical History:  Procedure Laterality Date  . IR FLUORO GUIDE CV LINE LEFT  05/15/2020  . IR REMOVAL TUN CV CATH W/O FL  05/15/2020  . IR US GUIDE VASC ACCESS LEFT  05/15/2020  . NECK SURGERY    . REDUCTION MAMMAPLASTY           Family History  Problem Relation Age of Onset  . Stroke Father   . Cancer Father        unknown ca  . Hypertension Mother   . Diabetes Mother   . Chronic Renal Failure Sister   . Chronic Renal Failure Brother   . Cancer Paternal Aunt        colon ca  . Breast cancer Neg Hx     SOCIAL HISTORY: Social History        Tobacco Use  . Smoking status: Former Smoker    Packs/day: 0.30    Quit date: 02/09/2009    Years since quitting: 11.2  . Smokeless tobacco: Never Used  . Tobacco comment: 1/3 pk every 3 days  Substance Use Topics  . Alcohol use: No    Alcohol/week: 0.0 standard  drinks         Allergies  Allergen Reactions  . Hydrochlorothiazide Itching and Other (See Comments)    Hyponatremia   . Penicillins Other (See Comments)    "takes all of her hair out" .Has patient had a PCN reaction causing immediate rash, facial/tongue/throat swelling, SOB or lightheadedness with hypotension: No Has patient had a PCN reaction causing severe rash involving mucus membranes or skin necrosis: No Has patient had a PCN reaction that required hospitalization No Has patient had a PCN reaction occurring within the last 10 years: No If all of the above answers are "NO", then may proceed with Cephalosporin use.           Current Outpatient Medications  Medication Sig Dispense Refill  . Accu-Chek FastClix Lancets MISC 5 (FIVE) TIMES DAILY DIAGNOSIS CODE E11.9 204 each 8  . atorvastatin (LIPITOR) 40 MG tablet Take 1 tablet (40 mg total) by  mouth daily. 90 tablet 3  . Blood Glucose Monitoring Suppl (ACCU-CHEK AVIVA PLUS) w/Device KIT Use to test blood glucose  3 times daily 1 kit 0  . calcium-vitamin D (CALCIUM 500+D HIGH POTENCY) 500-400 MG-UNIT tablet Take 1 tablet by mouth daily. 90 tablet 3  . CVS D3 50 MCG (2000 UT) CAPS TAKE 1 CAPSULE BY MOUTH EVERY DAY (Patient taking differently: Take 2,000 Units by mouth daily. ) 90 capsule 3  . glucose blood (ACCU-CHEK GUIDE) test strip 5 (five) times daily Diagnosis code E11.9 150 each 12  . hydrALAZINE (APRESOLINE) 50 MG tablet Take 1 tablet (50 mg total) by mouth 2 (two) times daily. 60 tablet 3  . insulin aspart (NOVOLOG FLEXPEN) 100 UNIT/ML FlexPen INJECT 7 UNITS WITH BREAKFAST, 10 WITH LUNCH, AND 8 WITH DINNER 15 mL 6  . insulin glargine (LANTUS) 100 UNIT/ML injection INJECT 0.2ML INTO THE SKIN TWICE A DAY, 15 UNITS IN AM AND 15 UNITS IN EVENING 20 mL 4  . Insulin Pen Needle (B-D UF III MINI PEN NEEDLES) 31G X 5 MM MISC USE TO INJECT NOVOLOG 3 TIMES A DAY. DIAGNOSIS CODE E11.9, Z79.4 100 each 8  . Insulin Syringe-Needle U-100 31G X 15/64" 0.3 ML MISC USE TO INJECT LANTUS INSULIN TWO TIMES A DAY 180 each 3  . loratadine (CLARITIN) 10 MG tablet Take 1 tablet (10 mg total) by mouth daily. 30 tablet 2  . omeprazole (PRILOSEC) 20 MG capsule Take 1 capsule (20 mg total) by mouth daily. 90 capsule 0  . Polyethyl Glycol-Propyl Glycol (SYSTANE OP) Apply 1 drop to eye daily as needed (for dry eyes).    . verapamil (CALAN-SR) 180 MG CR tablet TAKE 2 TABLETS (360 MG TOTAL) BY MOUTH AT BEDTIME. 60 tablet 3  . furosemide (LASIX) 20 MG tablet Take 2 tablets (40 mg total) by mouth 2 (two) times daily. (Patient not taking: Reported on 05/15/2020) 360 tablet 1   No current facility-administered medications for this visit.    REVIEW OF SYSTEMS:  [X]  denotes positive finding, [ ]  denotes negative finding Cardiac  Comments:  Chest pain or chest pressure:    Shortness of breath upon exertion:     Short of breath when lying flat:    Irregular heart rhythm:        Vascular    Pain in calf, thigh, or hip brought on by ambulation:    Pain in feet at night that wakes you up from your sleep:     Blood clot in your veins:    Leg swelling:  Pulmonary    Oxygen at home:    Productive cough:     Wheezing:         Neurologic    Sudden weakness in arms or legs:     Sudden numbness in arms or legs:     Sudden onset of difficulty speaking or slurred speech:    Temporary loss of vision in one eye:     Problems with dizziness:         Gastrointestinal    Blood in stool:     Vomited blood:         Genitourinary    Burning when urinating:     Blood in urine:        Psychiatric    Major depression:         Hematologic    Bleeding problems:    Problems with blood clotting too easily:        Skin    Rashes or ulcers:        Constitutional    Fever or chills:      PHYSICAL EXAM:    Vitals:   05/23/20 1045  BP: (!) 164/76  Pulse: (!) 107  Resp: 18  Temp: 98.1 F (36.7 C)  TempSrc: Temporal  SpO2: 98%  Weight: 94 lb (42.6 kg)  Height: 5' 2"  (1.575 m)    GENERAL: The patient is a well-nourished female, in no acute distress. The vital signs are documented above. CARDIAC: There is a regular rate and rhythm.  VASCULAR:  Palpable radial brachial pulses bilateral upper extremities No upper extremity tissue loss LIJ tunneled catheter PULMONARY: There is good air exchange bilaterally without wheezing or rales. ABDOMEN: Soft and non-tender with normal pitched bowel sounds.  MUSCULOSKELETAL: There are no major deformities or cyanosis. NEUROLOGIC: No focal weakness or paresthesias are detected. SKIN: There are no ulcers or rashes noted. PSYCHIATRIC: The patient has a normal affect.  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  She has a left IJ tunneled catheter that in place and she is now dialyzing on Tuesday Thursday Saturday.  I reviewed with her findings from her previous vein mapping in May when she saw Dr. Donnetta Hutching and she had no surface veins.  The plan at that time was a left arm AV graft when appropriate from [facility]  Given that she has now progressed to ESRD per the referral from Dr. Hollie Salk we will plan to proceed with scheduling her left arm AV graft.  I discussed risk and benefits including risk of bleeding, infection, steal, failure to mature.  We will get her scheduled on non-dialysis day.  She does not need a TDC at this time given she has a left IJ catheter placed at the hospital that is working without issue.   Marty Heck, MD [facility] Office: [phone]

## 2020-06-05 ENCOUNTER — Encounter (HOSPITAL_COMMUNITY): Payer: Self-pay | Admitting: Vascular Surgery

## 2020-06-05 NOTE — Anesthesia Postprocedure Evaluation (Signed)
Anesthesia Post Note  Patient: Susan Evans  Procedure(s) Performed: INSERTION OF ARTERIOVENOUS (AV) GORE-TEX GRAFT ARM LEFT (Left Arm Upper)     Patient location during evaluation: PACU Anesthesia Type: MAC Level of consciousness: awake and alert Pain management: pain level controlled Vital Signs Assessment: post-procedure vital signs reviewed and stable Respiratory status: spontaneous breathing Cardiovascular status: stable Anesthetic complications: no   No complications documented.  Last Vitals:  Vitals:   06/02/20 1200 06/02/20 1215  BP: 112/64 131/61  Pulse: 96 95  Resp: 11 20  Temp: 36.6 C 36.8 C  SpO2: 100% 100%    Last Pain:  Vitals:   06/02/20 1200  TempSrc:   PainSc: 0-No pain                 Nolon Nations

## 2020-06-09 ENCOUNTER — Encounter: Payer: Medicaid Other | Admitting: Student

## 2020-06-14 ENCOUNTER — Ambulatory Visit: Payer: Medicaid Other | Admitting: Student

## 2020-06-14 ENCOUNTER — Other Ambulatory Visit: Payer: Self-pay

## 2020-06-14 ENCOUNTER — Encounter: Payer: Self-pay | Admitting: Student

## 2020-06-14 VITALS — BP 139/71 | HR 106 | Temp 97.7°F | Ht 62.0 in | Wt 92.1 lb

## 2020-06-14 DIAGNOSIS — I120 Hypertensive chronic kidney disease with stage 5 chronic kidney disease or end stage renal disease: Secondary | ICD-10-CM | POA: Diagnosis not present

## 2020-06-14 DIAGNOSIS — N186 End stage renal disease: Secondary | ICD-10-CM

## 2020-06-14 DIAGNOSIS — Z0000 Encounter for general adult medical examination without abnormal findings: Secondary | ICD-10-CM | POA: Diagnosis present

## 2020-06-14 DIAGNOSIS — J309 Allergic rhinitis, unspecified: Secondary | ICD-10-CM | POA: Diagnosis not present

## 2020-06-14 DIAGNOSIS — I10 Essential (primary) hypertension: Secondary | ICD-10-CM

## 2020-06-14 DIAGNOSIS — Z720 Tobacco use: Secondary | ICD-10-CM

## 2020-06-14 DIAGNOSIS — E118 Type 2 diabetes mellitus with unspecified complications: Secondary | ICD-10-CM

## 2020-06-14 DIAGNOSIS — F17200 Nicotine dependence, unspecified, uncomplicated: Secondary | ICD-10-CM

## 2020-06-14 DIAGNOSIS — M810 Age-related osteoporosis without current pathological fracture: Secondary | ICD-10-CM | POA: Diagnosis not present

## 2020-06-14 DIAGNOSIS — E1122 Type 2 diabetes mellitus with diabetic chronic kidney disease: Secondary | ICD-10-CM

## 2020-06-14 DIAGNOSIS — R1013 Epigastric pain: Secondary | ICD-10-CM | POA: Diagnosis not present

## 2020-06-14 DIAGNOSIS — E785 Hyperlipidemia, unspecified: Secondary | ICD-10-CM

## 2020-06-14 LAB — POCT GLYCOSYLATED HEMOGLOBIN (HGB A1C): Hemoglobin A1C: 6 % — AB (ref 4.0–5.6)

## 2020-06-14 LAB — GLUCOSE, CAPILLARY: Glucose-Capillary: 183 mg/dL — ABNORMAL HIGH (ref 70–99)

## 2020-06-14 MED ORDER — OMEPRAZOLE 20 MG PO CPDR: 20.0000 mg | DELAYED_RELEASE_CAPSULE | Freq: Every day | ORAL | 0 refills | Status: DC

## 2020-06-14 MED ORDER — NICOTINE 14 MG/24HR TD PT24: 14.0000 mg | MEDICATED_PATCH | TRANSDERMAL | 0 refills | Status: AC

## 2020-06-14 MED ORDER — HYDRALAZINE HCL 50 MG PO TABS: 50.0000 mg | ORAL_TABLET | Freq: Two times a day (BID) | ORAL | 3 refills | Status: DC

## 2020-06-14 MED ORDER — CLONIDINE 0.2 MG/24HR TD PTWK: 0.2000 mg | MEDICATED_PATCH | TRANSDERMAL | 3 refills | Status: DC

## 2020-06-14 MED ORDER — CVS D3 50 MCG (2000 UT) PO CAPS: 2000.0000 [IU] | ORAL_CAPSULE | Freq: Every day | ORAL | 3 refills | Status: DC

## 2020-06-14 MED ORDER — NOVOLOG FLEXPEN 100 UNIT/ML ~~LOC~~ SOPN: PEN_INJECTOR | SUBCUTANEOUS | 6 refills | Status: DC

## 2020-06-14 MED ORDER — ATORVASTATIN CALCIUM 40 MG PO TABS: 40.0000 mg | ORAL_TABLET | Freq: Every day | ORAL | 3 refills | Status: DC

## 2020-06-14 MED ORDER — LORATADINE 10 MG PO TABS: 10.0000 mg | ORAL_TABLET | Freq: Every day | ORAL | 2 refills | Status: DC

## 2020-06-14 MED ORDER — VERAPAMIL HCL ER 180 MG PO TBCR: 360.0000 mg | EXTENDED_RELEASE_TABLET | Freq: Every day | ORAL | 3 refills | Status: DC

## 2020-06-14 NOTE — Progress Notes (Signed)
   CC: follow-up of DMII, HTN, medication refills, health maintenance  HPI:  [name] is a 64 y.o. woman with history of HTN, DM, HLD, and ESRD on TTS dialysis (started 04/2020) who presents to clinic for follow-up on her chronic medical conditions as well as health maintenance. Her last clinic visit was on 02/29/20 with Dr. Sheppard Coil.   To see the details of this patient's management of their acute and chronic problems, please refer to the Assessment & Plan under the Encounters tab.    Past Medical History:  Diagnosis Date  . Chronic kidney disease    Dialysis T/Th/Sat  . Diabetes mellitus    type 2   . Ganglion cyst    right wrist  . GERD (gastroesophageal reflux disease)   . History of radiation therapy 02/27/10 -04/13/10   left base of tongue, bilat neck  . Hyperlipidemia   . Hypertension   . Iron deficiency anemia   . Malignant neoplasm ([place]) 04/11   oropharyngeal carcinoma  . Menopause   . Thyroid disease    Hyperthyroidism  . Tobacco abuse    PT HAS QUIT   Review of Systems:    Review of Systems  Constitutional: Positive for malaise/fatigue. Negative for chills, fever and weight loss.  Respiratory: Negative for shortness of breath and wheezing.   Cardiovascular: Negative for chest pain and leg swelling.  Gastrointestinal: Negative for abdominal pain.  Neurological: Negative for focal weakness.    Physical Exam:  Vitals:   06/14/20 1403 06/14/20 1410  BP: (!) 163/68 139/71  Pulse: (!) 108 (!) 106  Temp: 97.7 F (36.5 C)   TempSrc: Oral   SpO2: 100%   Weight: 92 lb 1.6 oz (41.8 kg)   Height: 5\' 2"  (1.575 m)    Constitutional: very petite, thin, and chronically-ill appearing woman sitting in chair, in no acute distress HENT: normocephalic atraumatic, mucous membranes moist Neck: supple Cardiovascular: regular rate and rhythm, no m/r/g Pulmonary/Chest: normal work of breathing on room air, transmitted upper airway sounds on lung auscultation  bilaterally, no wheezes or crackles. No lower extremity edema. Skin of feet and lower legs is taut and shiny, dorsalis pedis pulses intact, difficult to palpate posterior tibial pulses. Abdominal: non-distended Neurological: alert & oriented x 3, slow though normal gait Skin: AV graft on L arm, warm to touch, non-erythematous though patient's skin is very dark, tender to palpation Neuro: sensation grossly intact in bilateral feet Psych: flat affect, "I'm doing alright."   Assessment & Plan:   See Encounters Tab for problem based charting.  Patient seen with Dr. Jimmye Norman

## 2020-06-14 NOTE — Patient Instructions (Addendum)
Ms. Perdomo,   Thank you for your visit to the [facility] today. It was a pleasure meeting you. Today we discussed the following:  1) Diabetes - Your A1c was 6.0% today.  - I would like to change your Novolog regimen: take 5 units of Novolog before breakfast, lunch, and dinner (instead of the 7 u, 10 u, and 8 u) - Continue your Lantus 15 units twice daily  2) Hypertension - I refilled your verapamil, hydralazine, and clonidine  3) Smoking cessation - Great job quitting smoking for the last 2 weeks!! Keep up the hard work. - I prescribed nicotine patches to help with this.  4) Health maintenance - We can do your cervical cancer screening (pap smear) next visit - We can also place a referral for your colonoscopy next visit  5) I also refilled your atorvastatin, vitamin D3, loratidine, and omeprazole  We would like to see you back in 3 months. Please bring all of your medications with you.   If you have any questions or concerns, please call our clinic at [phone] between 9am-5pm. Outside of these hours, call [phone] and ask for the internal medicine resident on call. If you feel you are having a medical emergency please call 911.

## 2020-06-15 ENCOUNTER — Encounter: Payer: Self-pay | Admitting: Student

## 2020-06-15 NOTE — Assessment & Plan Note (Signed)
Patient reports she has not been smoking for the last 2 weeks. Has been chewing nicotine gum and borrowing her sister's nicotine patches. She requests a prescription for nicotine patches, states she has gum.  Plan: - nicotine patches prescribed today - patient uses nicotine gum - address at subsequent visits

## 2020-06-15 NOTE — Assessment & Plan Note (Signed)
BP 163/68 today with P 108, improved to 139/71 with repeated measurement. Patient reports she monitors her BP at home and pressures range 110s-130s/60s-70s. She notes that furosemide was discontinued with initiation of dialysis.   Plan: - continue verapamil 360 mg daily - continue hydralazine 50 mg twice daily - continue clonidine patch 0.2 mg daily - above medications were refilled

## 2020-06-15 NOTE — Assessment & Plan Note (Signed)
A1c 6.0% from 6.4% last visit 3 months ago. Patient reports Susan Evans takes Lantus 15 units twice daily and NovoLog 3 times daily with meals (7 u, 10 u, 8 u). Sugars range 130-150, no reported highs or lows.   Plan: - Continue Lantus 15 units twice daily - To simplify her regimen, instructed her to decrease NovoLog to 5 units three times daily before each meal

## 2020-06-15 NOTE — Assessment & Plan Note (Signed)
Followed by [facility]. Started dialysis in August 2021. L AV graft placed by vascular surgery on 06/02/20. Patient is on TTS dialysis.  Today, she appears tired, has flat affect. When asked how dialysis is going, states, "it's alright." When asked if she is on a transplant list, she states yes. However, later on chart review, from a 02/10/20 telephone note from [facility], it appears at that time the patient did not meet selection criteria for listing per policy related to history of no show/cancellations. She was informed that she may may be referred back to the facility for consideration.  Plan: - follows with [facility] - dialysis TTS - readdress transplant status at subsequent visits

## 2020-06-15 NOTE — Assessment & Plan Note (Addendum)
-   flu shot: patient states she will get her flu shot through her dialysis center - COVID-19 vaccination: patient states she was vaccinated however has not been able to locate her card - colon cancer screening: she would like to wait at this time - breast cancer screening: last mammogram 12/06/19  Will need to have shared decision making regarding cancer surveillance with patient considering her prognosis with ESRD at this time.

## 2020-06-15 NOTE — Assessment & Plan Note (Signed)
Patient reports symptoms are well-controlled on loratidine. Refilled today.

## 2020-06-15 NOTE — Assessment & Plan Note (Signed)
Cholecalciferol. Discuss repeat DEXA at next visit.

## 2020-06-19 NOTE — Progress Notes (Signed)
DOS 06/14/20:  Internal Medicine Clinic Attending  I saw and evaluated the patient.  I personally confirmed the key portions of the history and exam documented by Dr. Shon Baton and I reviewed pertinent patient test results.  The assessment, diagnosis, and plan were formulated together and I agree with the documentation in the resident's note.

## 2020-06-22 DIAGNOSIS — N186 End stage renal disease: Secondary | ICD-10-CM | POA: Diagnosis not present

## 2020-06-22 DIAGNOSIS — Z992 Dependence on renal dialysis: Secondary | ICD-10-CM | POA: Diagnosis not present

## 2020-06-22 DIAGNOSIS — I129 Hypertensive chronic kidney disease with stage 1 through stage 4 chronic kidney disease, or unspecified chronic kidney disease: Secondary | ICD-10-CM | POA: Diagnosis not present

## 2020-07-23 DIAGNOSIS — I129 Hypertensive chronic kidney disease with stage 1 through stage 4 chronic kidney disease, or unspecified chronic kidney disease: Secondary | ICD-10-CM | POA: Diagnosis not present

## 2020-07-23 DIAGNOSIS — N186 End stage renal disease: Secondary | ICD-10-CM | POA: Diagnosis not present

## 2020-07-23 DIAGNOSIS — Z992 Dependence on renal dialysis: Secondary | ICD-10-CM | POA: Diagnosis not present

## 2020-08-07 DIAGNOSIS — Z452 Encounter for adjustment and management of vascular access device: Secondary | ICD-10-CM | POA: Diagnosis not present

## 2020-08-09 ENCOUNTER — Other Ambulatory Visit: Payer: Self-pay | Admitting: Student

## 2020-08-09 DIAGNOSIS — R1013 Epigastric pain: Secondary | ICD-10-CM

## 2020-08-22 DIAGNOSIS — I129 Hypertensive chronic kidney disease with stage 1 through stage 4 chronic kidney disease, or unspecified chronic kidney disease: Secondary | ICD-10-CM | POA: Diagnosis not present

## 2020-08-22 DIAGNOSIS — Z992 Dependence on renal dialysis: Secondary | ICD-10-CM | POA: Diagnosis not present

## 2020-08-22 DIAGNOSIS — N186 End stage renal disease: Secondary | ICD-10-CM | POA: Diagnosis not present

## 2020-08-24 DIAGNOSIS — N186 End stage renal disease: Secondary | ICD-10-CM | POA: Diagnosis not present

## 2020-08-24 DIAGNOSIS — Z992 Dependence on renal dialysis: Secondary | ICD-10-CM | POA: Diagnosis not present

## 2020-08-24 DIAGNOSIS — E876 Hypokalemia: Secondary | ICD-10-CM | POA: Diagnosis not present

## 2020-08-24 DIAGNOSIS — N2581 Secondary hyperparathyroidism of renal origin: Secondary | ICD-10-CM | POA: Diagnosis not present

## 2020-08-24 DIAGNOSIS — E611 Iron deficiency: Secondary | ICD-10-CM | POA: Diagnosis not present

## 2020-08-24 DIAGNOSIS — D631 Anemia in chronic kidney disease: Secondary | ICD-10-CM | POA: Diagnosis not present

## 2020-08-26 DIAGNOSIS — N2581 Secondary hyperparathyroidism of renal origin: Secondary | ICD-10-CM | POA: Diagnosis not present

## 2020-08-26 DIAGNOSIS — E876 Hypokalemia: Secondary | ICD-10-CM | POA: Diagnosis not present

## 2020-08-26 DIAGNOSIS — D631 Anemia in chronic kidney disease: Secondary | ICD-10-CM | POA: Diagnosis not present

## 2020-08-26 DIAGNOSIS — E611 Iron deficiency: Secondary | ICD-10-CM | POA: Diagnosis not present

## 2020-08-26 DIAGNOSIS — Z992 Dependence on renal dialysis: Secondary | ICD-10-CM | POA: Diagnosis not present

## 2020-08-26 DIAGNOSIS — N186 End stage renal disease: Secondary | ICD-10-CM | POA: Diagnosis not present

## 2020-08-29 DIAGNOSIS — N2581 Secondary hyperparathyroidism of renal origin: Secondary | ICD-10-CM | POA: Diagnosis not present

## 2020-08-29 DIAGNOSIS — E876 Hypokalemia: Secondary | ICD-10-CM | POA: Diagnosis not present

## 2020-08-29 DIAGNOSIS — N186 End stage renal disease: Secondary | ICD-10-CM | POA: Diagnosis not present

## 2020-08-29 DIAGNOSIS — E611 Iron deficiency: Secondary | ICD-10-CM | POA: Diagnosis not present

## 2020-08-29 DIAGNOSIS — D631 Anemia in chronic kidney disease: Secondary | ICD-10-CM | POA: Diagnosis not present

## 2020-08-29 DIAGNOSIS — Z992 Dependence on renal dialysis: Secondary | ICD-10-CM | POA: Diagnosis not present

## 2020-08-31 DIAGNOSIS — E876 Hypokalemia: Secondary | ICD-10-CM | POA: Diagnosis not present

## 2020-08-31 DIAGNOSIS — E611 Iron deficiency: Secondary | ICD-10-CM | POA: Diagnosis not present

## 2020-08-31 DIAGNOSIS — D631 Anemia in chronic kidney disease: Secondary | ICD-10-CM | POA: Diagnosis not present

## 2020-08-31 DIAGNOSIS — N186 End stage renal disease: Secondary | ICD-10-CM | POA: Diagnosis not present

## 2020-08-31 DIAGNOSIS — N2581 Secondary hyperparathyroidism of renal origin: Secondary | ICD-10-CM | POA: Diagnosis not present

## 2020-08-31 DIAGNOSIS — Z992 Dependence on renal dialysis: Secondary | ICD-10-CM | POA: Diagnosis not present

## 2020-09-01 DIAGNOSIS — D631 Anemia in chronic kidney disease: Secondary | ICD-10-CM | POA: Diagnosis not present

## 2020-09-01 DIAGNOSIS — E611 Iron deficiency: Secondary | ICD-10-CM | POA: Diagnosis not present

## 2020-09-01 DIAGNOSIS — N2581 Secondary hyperparathyroidism of renal origin: Secondary | ICD-10-CM | POA: Diagnosis not present

## 2020-09-01 DIAGNOSIS — E876 Hypokalemia: Secondary | ICD-10-CM | POA: Diagnosis not present

## 2020-09-01 DIAGNOSIS — N186 End stage renal disease: Secondary | ICD-10-CM | POA: Diagnosis not present

## 2020-09-01 DIAGNOSIS — Z992 Dependence on renal dialysis: Secondary | ICD-10-CM | POA: Diagnosis not present

## 2020-09-05 DIAGNOSIS — E611 Iron deficiency: Secondary | ICD-10-CM | POA: Diagnosis not present

## 2020-09-05 DIAGNOSIS — N2581 Secondary hyperparathyroidism of renal origin: Secondary | ICD-10-CM | POA: Diagnosis not present

## 2020-09-05 DIAGNOSIS — Z992 Dependence on renal dialysis: Secondary | ICD-10-CM | POA: Diagnosis not present

## 2020-09-05 DIAGNOSIS — I871 Compression of vein: Secondary | ICD-10-CM | POA: Diagnosis not present

## 2020-09-05 DIAGNOSIS — D631 Anemia in chronic kidney disease: Secondary | ICD-10-CM | POA: Diagnosis not present

## 2020-09-05 DIAGNOSIS — E876 Hypokalemia: Secondary | ICD-10-CM | POA: Diagnosis not present

## 2020-09-05 DIAGNOSIS — N186 End stage renal disease: Secondary | ICD-10-CM | POA: Diagnosis not present

## 2020-09-05 DIAGNOSIS — T82868A Thrombosis of vascular prosthetic devices, implants and grafts, initial encounter: Secondary | ICD-10-CM | POA: Diagnosis not present

## 2020-09-06 DIAGNOSIS — Z992 Dependence on renal dialysis: Secondary | ICD-10-CM | POA: Diagnosis not present

## 2020-09-06 DIAGNOSIS — N2581 Secondary hyperparathyroidism of renal origin: Secondary | ICD-10-CM | POA: Diagnosis not present

## 2020-09-06 DIAGNOSIS — E611 Iron deficiency: Secondary | ICD-10-CM | POA: Diagnosis not present

## 2020-09-06 DIAGNOSIS — D631 Anemia in chronic kidney disease: Secondary | ICD-10-CM | POA: Diagnosis not present

## 2020-09-06 DIAGNOSIS — E876 Hypokalemia: Secondary | ICD-10-CM | POA: Diagnosis not present

## 2020-09-06 DIAGNOSIS — N186 End stage renal disease: Secondary | ICD-10-CM | POA: Diagnosis not present

## 2020-09-07 DIAGNOSIS — D631 Anemia in chronic kidney disease: Secondary | ICD-10-CM | POA: Diagnosis not present

## 2020-09-07 DIAGNOSIS — N186 End stage renal disease: Secondary | ICD-10-CM | POA: Diagnosis not present

## 2020-09-07 DIAGNOSIS — E611 Iron deficiency: Secondary | ICD-10-CM | POA: Diagnosis not present

## 2020-09-07 DIAGNOSIS — E876 Hypokalemia: Secondary | ICD-10-CM | POA: Diagnosis not present

## 2020-09-07 DIAGNOSIS — N2581 Secondary hyperparathyroidism of renal origin: Secondary | ICD-10-CM | POA: Diagnosis not present

## 2020-09-07 DIAGNOSIS — Z992 Dependence on renal dialysis: Secondary | ICD-10-CM | POA: Diagnosis not present

## 2020-09-09 DIAGNOSIS — D631 Anemia in chronic kidney disease: Secondary | ICD-10-CM | POA: Diagnosis not present

## 2020-09-09 DIAGNOSIS — Z992 Dependence on renal dialysis: Secondary | ICD-10-CM | POA: Diagnosis not present

## 2020-09-09 DIAGNOSIS — E611 Iron deficiency: Secondary | ICD-10-CM | POA: Diagnosis not present

## 2020-09-09 DIAGNOSIS — N2581 Secondary hyperparathyroidism of renal origin: Secondary | ICD-10-CM | POA: Diagnosis not present

## 2020-09-09 DIAGNOSIS — E876 Hypokalemia: Secondary | ICD-10-CM | POA: Diagnosis not present

## 2020-09-09 DIAGNOSIS — N186 End stage renal disease: Secondary | ICD-10-CM | POA: Diagnosis not present

## 2020-09-12 DIAGNOSIS — E876 Hypokalemia: Secondary | ICD-10-CM | POA: Diagnosis not present

## 2020-09-12 DIAGNOSIS — E611 Iron deficiency: Secondary | ICD-10-CM | POA: Diagnosis not present

## 2020-09-12 DIAGNOSIS — N186 End stage renal disease: Secondary | ICD-10-CM | POA: Diagnosis not present

## 2020-09-12 DIAGNOSIS — D631 Anemia in chronic kidney disease: Secondary | ICD-10-CM | POA: Diagnosis not present

## 2020-09-12 DIAGNOSIS — Z992 Dependence on renal dialysis: Secondary | ICD-10-CM | POA: Diagnosis not present

## 2020-09-12 DIAGNOSIS — N2581 Secondary hyperparathyroidism of renal origin: Secondary | ICD-10-CM | POA: Diagnosis not present

## 2020-09-13 ENCOUNTER — Encounter: Payer: Medicaid Other | Admitting: Student

## 2020-09-14 DIAGNOSIS — E611 Iron deficiency: Secondary | ICD-10-CM | POA: Diagnosis not present

## 2020-09-14 DIAGNOSIS — N186 End stage renal disease: Secondary | ICD-10-CM | POA: Diagnosis not present

## 2020-09-14 DIAGNOSIS — N2581 Secondary hyperparathyroidism of renal origin: Secondary | ICD-10-CM | POA: Diagnosis not present

## 2020-09-14 DIAGNOSIS — E876 Hypokalemia: Secondary | ICD-10-CM | POA: Diagnosis not present

## 2020-09-14 DIAGNOSIS — D631 Anemia in chronic kidney disease: Secondary | ICD-10-CM | POA: Diagnosis not present

## 2020-09-14 DIAGNOSIS — Z992 Dependence on renal dialysis: Secondary | ICD-10-CM | POA: Diagnosis not present

## 2020-09-18 DIAGNOSIS — Z992 Dependence on renal dialysis: Secondary | ICD-10-CM | POA: Diagnosis not present

## 2020-09-18 DIAGNOSIS — T82868A Thrombosis of vascular prosthetic devices, implants and grafts, initial encounter: Secondary | ICD-10-CM | POA: Diagnosis not present

## 2020-09-18 DIAGNOSIS — I871 Compression of vein: Secondary | ICD-10-CM | POA: Diagnosis not present

## 2020-09-18 DIAGNOSIS — N186 End stage renal disease: Secondary | ICD-10-CM | POA: Diagnosis not present

## 2020-09-19 DIAGNOSIS — Z992 Dependence on renal dialysis: Secondary | ICD-10-CM | POA: Diagnosis not present

## 2020-09-19 DIAGNOSIS — E876 Hypokalemia: Secondary | ICD-10-CM | POA: Diagnosis not present

## 2020-09-19 DIAGNOSIS — N2581 Secondary hyperparathyroidism of renal origin: Secondary | ICD-10-CM | POA: Diagnosis not present

## 2020-09-19 DIAGNOSIS — D631 Anemia in chronic kidney disease: Secondary | ICD-10-CM | POA: Diagnosis not present

## 2020-09-19 DIAGNOSIS — N186 End stage renal disease: Secondary | ICD-10-CM | POA: Diagnosis not present

## 2020-09-19 DIAGNOSIS — E611 Iron deficiency: Secondary | ICD-10-CM | POA: Diagnosis not present

## 2020-09-20 ENCOUNTER — Other Ambulatory Visit: Payer: Self-pay | Admitting: Student

## 2020-09-20 DIAGNOSIS — I10 Essential (primary) hypertension: Secondary | ICD-10-CM

## 2020-09-21 DIAGNOSIS — T82868A Thrombosis of vascular prosthetic devices, implants and grafts, initial encounter: Secondary | ICD-10-CM | POA: Diagnosis not present

## 2020-09-21 DIAGNOSIS — Z992 Dependence on renal dialysis: Secondary | ICD-10-CM | POA: Diagnosis not present

## 2020-09-21 DIAGNOSIS — N186 End stage renal disease: Secondary | ICD-10-CM | POA: Diagnosis not present

## 2020-09-22 DIAGNOSIS — Z992 Dependence on renal dialysis: Secondary | ICD-10-CM | POA: Diagnosis not present

## 2020-09-22 DIAGNOSIS — N186 End stage renal disease: Secondary | ICD-10-CM | POA: Diagnosis not present

## 2020-09-22 DIAGNOSIS — I129 Hypertensive chronic kidney disease with stage 1 through stage 4 chronic kidney disease, or unspecified chronic kidney disease: Secondary | ICD-10-CM | POA: Diagnosis not present

## 2020-09-24 DIAGNOSIS — D689 Coagulation defect, unspecified: Secondary | ICD-10-CM | POA: Insufficient documentation

## 2020-09-24 DIAGNOSIS — R519 Headache, unspecified: Secondary | ICD-10-CM | POA: Diagnosis not present

## 2020-09-24 DIAGNOSIS — D631 Anemia in chronic kidney disease: Secondary | ICD-10-CM | POA: Diagnosis not present

## 2020-09-24 DIAGNOSIS — E611 Iron deficiency: Secondary | ICD-10-CM | POA: Diagnosis not present

## 2020-09-24 DIAGNOSIS — N2581 Secondary hyperparathyroidism of renal origin: Secondary | ICD-10-CM | POA: Diagnosis not present

## 2020-09-24 DIAGNOSIS — E1129 Type 2 diabetes mellitus with other diabetic kidney complication: Secondary | ICD-10-CM | POA: Diagnosis not present

## 2020-09-24 DIAGNOSIS — D688 Other specified coagulation defects: Secondary | ICD-10-CM | POA: Diagnosis not present

## 2020-09-24 DIAGNOSIS — T8249XD Other complication of vascular dialysis catheter, subsequent encounter: Secondary | ICD-10-CM | POA: Diagnosis not present

## 2020-09-24 DIAGNOSIS — E876 Hypokalemia: Secondary | ICD-10-CM | POA: Diagnosis not present

## 2020-09-24 DIAGNOSIS — N186 End stage renal disease: Secondary | ICD-10-CM | POA: Diagnosis not present

## 2020-09-24 DIAGNOSIS — Z992 Dependence on renal dialysis: Secondary | ICD-10-CM | POA: Diagnosis not present

## 2020-09-26 DIAGNOSIS — E611 Iron deficiency: Secondary | ICD-10-CM | POA: Diagnosis not present

## 2020-09-26 DIAGNOSIS — T8249XD Other complication of vascular dialysis catheter, subsequent encounter: Secondary | ICD-10-CM | POA: Diagnosis not present

## 2020-09-26 DIAGNOSIS — N2581 Secondary hyperparathyroidism of renal origin: Secondary | ICD-10-CM | POA: Diagnosis not present

## 2020-09-26 DIAGNOSIS — E1129 Type 2 diabetes mellitus with other diabetic kidney complication: Secondary | ICD-10-CM | POA: Diagnosis not present

## 2020-09-26 DIAGNOSIS — R519 Headache, unspecified: Secondary | ICD-10-CM | POA: Diagnosis not present

## 2020-09-26 DIAGNOSIS — D631 Anemia in chronic kidney disease: Secondary | ICD-10-CM | POA: Diagnosis not present

## 2020-09-26 DIAGNOSIS — N186 End stage renal disease: Secondary | ICD-10-CM | POA: Diagnosis not present

## 2020-09-26 DIAGNOSIS — E876 Hypokalemia: Secondary | ICD-10-CM | POA: Diagnosis not present

## 2020-09-26 DIAGNOSIS — D688 Other specified coagulation defects: Secondary | ICD-10-CM | POA: Diagnosis not present

## 2020-09-26 DIAGNOSIS — Z992 Dependence on renal dialysis: Secondary | ICD-10-CM | POA: Diagnosis not present

## 2020-09-26 DIAGNOSIS — D689 Coagulation defect, unspecified: Secondary | ICD-10-CM | POA: Diagnosis not present

## 2020-09-28 DIAGNOSIS — Z992 Dependence on renal dialysis: Secondary | ICD-10-CM | POA: Diagnosis not present

## 2020-09-28 DIAGNOSIS — T8249XD Other complication of vascular dialysis catheter, subsequent encounter: Secondary | ICD-10-CM | POA: Diagnosis not present

## 2020-09-28 DIAGNOSIS — N186 End stage renal disease: Secondary | ICD-10-CM | POA: Diagnosis not present

## 2020-09-28 DIAGNOSIS — E876 Hypokalemia: Secondary | ICD-10-CM | POA: Diagnosis not present

## 2020-09-28 DIAGNOSIS — N2581 Secondary hyperparathyroidism of renal origin: Secondary | ICD-10-CM | POA: Diagnosis not present

## 2020-09-28 DIAGNOSIS — D689 Coagulation defect, unspecified: Secondary | ICD-10-CM | POA: Diagnosis not present

## 2020-09-28 DIAGNOSIS — D631 Anemia in chronic kidney disease: Secondary | ICD-10-CM | POA: Diagnosis not present

## 2020-09-28 DIAGNOSIS — E611 Iron deficiency: Secondary | ICD-10-CM | POA: Diagnosis not present

## 2020-09-28 DIAGNOSIS — D688 Other specified coagulation defects: Secondary | ICD-10-CM | POA: Diagnosis not present

## 2020-09-28 DIAGNOSIS — R519 Headache, unspecified: Secondary | ICD-10-CM | POA: Diagnosis not present

## 2020-09-28 DIAGNOSIS — E1129 Type 2 diabetes mellitus with other diabetic kidney complication: Secondary | ICD-10-CM | POA: Diagnosis not present

## 2020-09-30 DIAGNOSIS — N2581 Secondary hyperparathyroidism of renal origin: Secondary | ICD-10-CM | POA: Diagnosis not present

## 2020-09-30 DIAGNOSIS — E1129 Type 2 diabetes mellitus with other diabetic kidney complication: Secondary | ICD-10-CM | POA: Diagnosis not present

## 2020-09-30 DIAGNOSIS — T8249XD Other complication of vascular dialysis catheter, subsequent encounter: Secondary | ICD-10-CM | POA: Diagnosis not present

## 2020-09-30 DIAGNOSIS — Z992 Dependence on renal dialysis: Secondary | ICD-10-CM | POA: Diagnosis not present

## 2020-09-30 DIAGNOSIS — D689 Coagulation defect, unspecified: Secondary | ICD-10-CM | POA: Diagnosis not present

## 2020-09-30 DIAGNOSIS — E611 Iron deficiency: Secondary | ICD-10-CM | POA: Diagnosis not present

## 2020-09-30 DIAGNOSIS — E876 Hypokalemia: Secondary | ICD-10-CM | POA: Diagnosis not present

## 2020-09-30 DIAGNOSIS — D688 Other specified coagulation defects: Secondary | ICD-10-CM | POA: Diagnosis not present

## 2020-09-30 DIAGNOSIS — N186 End stage renal disease: Secondary | ICD-10-CM | POA: Diagnosis not present

## 2020-09-30 DIAGNOSIS — D631 Anemia in chronic kidney disease: Secondary | ICD-10-CM | POA: Diagnosis not present

## 2020-09-30 DIAGNOSIS — R519 Headache, unspecified: Secondary | ICD-10-CM | POA: Diagnosis not present

## 2020-10-03 DIAGNOSIS — T8249XD Other complication of vascular dialysis catheter, subsequent encounter: Secondary | ICD-10-CM | POA: Diagnosis not present

## 2020-10-03 DIAGNOSIS — N2581 Secondary hyperparathyroidism of renal origin: Secondary | ICD-10-CM | POA: Diagnosis not present

## 2020-10-03 DIAGNOSIS — R519 Headache, unspecified: Secondary | ICD-10-CM | POA: Diagnosis not present

## 2020-10-03 DIAGNOSIS — D631 Anemia in chronic kidney disease: Secondary | ICD-10-CM | POA: Diagnosis not present

## 2020-10-03 DIAGNOSIS — D689 Coagulation defect, unspecified: Secondary | ICD-10-CM | POA: Diagnosis not present

## 2020-10-03 DIAGNOSIS — Z992 Dependence on renal dialysis: Secondary | ICD-10-CM | POA: Diagnosis not present

## 2020-10-03 DIAGNOSIS — D688 Other specified coagulation defects: Secondary | ICD-10-CM | POA: Diagnosis not present

## 2020-10-03 DIAGNOSIS — E611 Iron deficiency: Secondary | ICD-10-CM | POA: Diagnosis not present

## 2020-10-03 DIAGNOSIS — E876 Hypokalemia: Secondary | ICD-10-CM | POA: Diagnosis not present

## 2020-10-03 DIAGNOSIS — N186 End stage renal disease: Secondary | ICD-10-CM | POA: Diagnosis not present

## 2020-10-03 DIAGNOSIS — E1129 Type 2 diabetes mellitus with other diabetic kidney complication: Secondary | ICD-10-CM | POA: Diagnosis not present

## 2020-10-05 DIAGNOSIS — D688 Other specified coagulation defects: Secondary | ICD-10-CM | POA: Diagnosis not present

## 2020-10-05 DIAGNOSIS — D631 Anemia in chronic kidney disease: Secondary | ICD-10-CM | POA: Diagnosis not present

## 2020-10-05 DIAGNOSIS — N2581 Secondary hyperparathyroidism of renal origin: Secondary | ICD-10-CM | POA: Diagnosis not present

## 2020-10-05 DIAGNOSIS — Z992 Dependence on renal dialysis: Secondary | ICD-10-CM | POA: Diagnosis not present

## 2020-10-05 DIAGNOSIS — R519 Headache, unspecified: Secondary | ICD-10-CM | POA: Diagnosis not present

## 2020-10-05 DIAGNOSIS — E876 Hypokalemia: Secondary | ICD-10-CM | POA: Diagnosis not present

## 2020-10-05 DIAGNOSIS — D689 Coagulation defect, unspecified: Secondary | ICD-10-CM | POA: Diagnosis not present

## 2020-10-05 DIAGNOSIS — N186 End stage renal disease: Secondary | ICD-10-CM | POA: Diagnosis not present

## 2020-10-05 DIAGNOSIS — E611 Iron deficiency: Secondary | ICD-10-CM | POA: Diagnosis not present

## 2020-10-05 DIAGNOSIS — T8249XD Other complication of vascular dialysis catheter, subsequent encounter: Secondary | ICD-10-CM | POA: Diagnosis not present

## 2020-10-05 DIAGNOSIS — E1129 Type 2 diabetes mellitus with other diabetic kidney complication: Secondary | ICD-10-CM | POA: Diagnosis not present

## 2020-10-06 ENCOUNTER — Other Ambulatory Visit: Payer: Self-pay | Admitting: *Deleted

## 2020-10-06 NOTE — Patient Outreach (Signed)
Care Coordination  10/06/2020  [name] 02-21-1956 [id number]    Medicaid Managed Care   Unsuccessful Outreach Note  10/06/2020 Name: Susan Evans MRN: [id number] DOB: 07-28-1956  Referred by: Alexandria Lodge, MD Reason for referral : High Risk Managed Medicaid (Unsuccessful initial outreach)   An unsuccessful telephone outreach was attempted today. The patient was referred to the case management team for assistance with care management and care coordination.   Follow Up Plan: The care management team will reach out to the patient again over the next 7-14 days.   Lurena Joiner RN, BSN [place]  [employer] manager

## 2020-10-06 NOTE — Patient Instructions (Signed)
Visit Information  [name]  - as a part of your Medicaid benefit, you are eligible for care management and care coordination services at no cost or copay. I was unable to reach you by phone today but would be happy to help you with your health related needs. Please feel free to call me @ [phone].   A member of the Managed Medicaid care management team will reach out to you again over the next 7-14 days.   Lurena Joiner RN, BSN [place]  [employer] manager

## 2020-10-07 DIAGNOSIS — R519 Headache, unspecified: Secondary | ICD-10-CM | POA: Diagnosis not present

## 2020-10-07 DIAGNOSIS — E1129 Type 2 diabetes mellitus with other diabetic kidney complication: Secondary | ICD-10-CM | POA: Diagnosis not present

## 2020-10-07 DIAGNOSIS — D689 Coagulation defect, unspecified: Secondary | ICD-10-CM | POA: Diagnosis not present

## 2020-10-07 DIAGNOSIS — E876 Hypokalemia: Secondary | ICD-10-CM | POA: Diagnosis not present

## 2020-10-07 DIAGNOSIS — N186 End stage renal disease: Secondary | ICD-10-CM | POA: Diagnosis not present

## 2020-10-07 DIAGNOSIS — D631 Anemia in chronic kidney disease: Secondary | ICD-10-CM | POA: Diagnosis not present

## 2020-10-07 DIAGNOSIS — E611 Iron deficiency: Secondary | ICD-10-CM | POA: Diagnosis not present

## 2020-10-07 DIAGNOSIS — N2581 Secondary hyperparathyroidism of renal origin: Secondary | ICD-10-CM | POA: Diagnosis not present

## 2020-10-07 DIAGNOSIS — Z992 Dependence on renal dialysis: Secondary | ICD-10-CM | POA: Diagnosis not present

## 2020-10-07 DIAGNOSIS — D688 Other specified coagulation defects: Secondary | ICD-10-CM | POA: Diagnosis not present

## 2020-10-07 DIAGNOSIS — T8249XD Other complication of vascular dialysis catheter, subsequent encounter: Secondary | ICD-10-CM | POA: Diagnosis not present

## 2020-10-10 DIAGNOSIS — E1129 Type 2 diabetes mellitus with other diabetic kidney complication: Secondary | ICD-10-CM | POA: Diagnosis not present

## 2020-10-10 DIAGNOSIS — Z992 Dependence on renal dialysis: Secondary | ICD-10-CM | POA: Diagnosis not present

## 2020-10-10 DIAGNOSIS — T8249XD Other complication of vascular dialysis catheter, subsequent encounter: Secondary | ICD-10-CM | POA: Diagnosis not present

## 2020-10-10 DIAGNOSIS — D631 Anemia in chronic kidney disease: Secondary | ICD-10-CM | POA: Diagnosis not present

## 2020-10-10 DIAGNOSIS — D689 Coagulation defect, unspecified: Secondary | ICD-10-CM | POA: Diagnosis not present

## 2020-10-10 DIAGNOSIS — E876 Hypokalemia: Secondary | ICD-10-CM | POA: Diagnosis not present

## 2020-10-10 DIAGNOSIS — E611 Iron deficiency: Secondary | ICD-10-CM | POA: Diagnosis not present

## 2020-10-10 DIAGNOSIS — N186 End stage renal disease: Secondary | ICD-10-CM | POA: Diagnosis not present

## 2020-10-10 DIAGNOSIS — R519 Headache, unspecified: Secondary | ICD-10-CM | POA: Diagnosis not present

## 2020-10-10 DIAGNOSIS — N2581 Secondary hyperparathyroidism of renal origin: Secondary | ICD-10-CM | POA: Diagnosis not present

## 2020-10-10 DIAGNOSIS — D688 Other specified coagulation defects: Secondary | ICD-10-CM | POA: Diagnosis not present

## 2020-10-11 ENCOUNTER — Other Ambulatory Visit: Payer: Self-pay | Admitting: Student

## 2020-10-11 DIAGNOSIS — I10 Essential (primary) hypertension: Secondary | ICD-10-CM

## 2020-10-12 DIAGNOSIS — D631 Anemia in chronic kidney disease: Secondary | ICD-10-CM | POA: Diagnosis not present

## 2020-10-12 DIAGNOSIS — T8249XD Other complication of vascular dialysis catheter, subsequent encounter: Secondary | ICD-10-CM | POA: Diagnosis not present

## 2020-10-12 DIAGNOSIS — E876 Hypokalemia: Secondary | ICD-10-CM | POA: Diagnosis not present

## 2020-10-12 DIAGNOSIS — D688 Other specified coagulation defects: Secondary | ICD-10-CM | POA: Diagnosis not present

## 2020-10-12 DIAGNOSIS — D689 Coagulation defect, unspecified: Secondary | ICD-10-CM | POA: Diagnosis not present

## 2020-10-12 DIAGNOSIS — R519 Headache, unspecified: Secondary | ICD-10-CM | POA: Diagnosis not present

## 2020-10-12 DIAGNOSIS — E611 Iron deficiency: Secondary | ICD-10-CM | POA: Diagnosis not present

## 2020-10-12 DIAGNOSIS — Z992 Dependence on renal dialysis: Secondary | ICD-10-CM | POA: Diagnosis not present

## 2020-10-12 DIAGNOSIS — N186 End stage renal disease: Secondary | ICD-10-CM | POA: Diagnosis not present

## 2020-10-12 DIAGNOSIS — N2581 Secondary hyperparathyroidism of renal origin: Secondary | ICD-10-CM | POA: Diagnosis not present

## 2020-10-12 DIAGNOSIS — E1129 Type 2 diabetes mellitus with other diabetic kidney complication: Secondary | ICD-10-CM | POA: Diagnosis not present

## 2020-10-13 ENCOUNTER — Other Ambulatory Visit: Payer: Self-pay | Admitting: *Deleted

## 2020-10-13 NOTE — Patient Outreach (Signed)
Care Coordination  10/13/2020  Susan Evans August 19, 1956 [id number]    Medicaid Managed Care   Unsuccessful Outreach Note  10/13/2020 Name: Susan Evans [place] MRN: [id number] DOB: 1955-12-30  Referred by: Alexandria Lodge, MD Reason for referral : High Risk Managed Medicaid (Unsuccessful RNCM initial outreach)   An unsuccessful telephone outreach was attempted today. The patient was referred to the case management team for assistance with care management and care coordination.   Follow Up Plan: The care management team will reach out to the patient again over the next 7-14 days.   Lurena Joiner RN, BSN [place]  [employer] manager

## 2020-10-13 NOTE — Patient Instructions (Signed)
Visit Information  [name]  - as a part of your Medicaid benefit, you are eligible for care management and care coordination services at no cost or copay. I was unable to reach you by phone today but would be happy to help you with your health related needs. Please feel free to call me @ [phone].   A member of the Managed Medicaid care management team will reach out to you again over the next 7-14 days.   Lurena Joiner RN, BSN   [employer] manager

## 2020-10-14 DIAGNOSIS — E611 Iron deficiency: Secondary | ICD-10-CM | POA: Diagnosis not present

## 2020-10-14 DIAGNOSIS — N186 End stage renal disease: Secondary | ICD-10-CM | POA: Diagnosis not present

## 2020-10-14 DIAGNOSIS — Z992 Dependence on renal dialysis: Secondary | ICD-10-CM | POA: Diagnosis not present

## 2020-10-14 DIAGNOSIS — R519 Headache, unspecified: Secondary | ICD-10-CM | POA: Diagnosis not present

## 2020-10-14 DIAGNOSIS — E1129 Type 2 diabetes mellitus with other diabetic kidney complication: Secondary | ICD-10-CM | POA: Diagnosis not present

## 2020-10-14 DIAGNOSIS — D689 Coagulation defect, unspecified: Secondary | ICD-10-CM | POA: Diagnosis not present

## 2020-10-14 DIAGNOSIS — D688 Other specified coagulation defects: Secondary | ICD-10-CM | POA: Diagnosis not present

## 2020-10-14 DIAGNOSIS — T8249XD Other complication of vascular dialysis catheter, subsequent encounter: Secondary | ICD-10-CM | POA: Diagnosis not present

## 2020-10-14 DIAGNOSIS — E876 Hypokalemia: Secondary | ICD-10-CM | POA: Diagnosis not present

## 2020-10-14 DIAGNOSIS — D631 Anemia in chronic kidney disease: Secondary | ICD-10-CM | POA: Diagnosis not present

## 2020-10-14 DIAGNOSIS — N2581 Secondary hyperparathyroidism of renal origin: Secondary | ICD-10-CM | POA: Diagnosis not present

## 2020-10-17 DIAGNOSIS — E1129 Type 2 diabetes mellitus with other diabetic kidney complication: Secondary | ICD-10-CM | POA: Diagnosis not present

## 2020-10-17 DIAGNOSIS — D689 Coagulation defect, unspecified: Secondary | ICD-10-CM | POA: Diagnosis not present

## 2020-10-17 DIAGNOSIS — T8249XD Other complication of vascular dialysis catheter, subsequent encounter: Secondary | ICD-10-CM | POA: Diagnosis not present

## 2020-10-17 DIAGNOSIS — Z992 Dependence on renal dialysis: Secondary | ICD-10-CM | POA: Diagnosis not present

## 2020-10-17 DIAGNOSIS — R519 Headache, unspecified: Secondary | ICD-10-CM | POA: Diagnosis not present

## 2020-10-17 DIAGNOSIS — N2581 Secondary hyperparathyroidism of renal origin: Secondary | ICD-10-CM | POA: Diagnosis not present

## 2020-10-17 DIAGNOSIS — D631 Anemia in chronic kidney disease: Secondary | ICD-10-CM | POA: Diagnosis not present

## 2020-10-17 DIAGNOSIS — N186 End stage renal disease: Secondary | ICD-10-CM | POA: Diagnosis not present

## 2020-10-17 DIAGNOSIS — E611 Iron deficiency: Secondary | ICD-10-CM | POA: Diagnosis not present

## 2020-10-17 DIAGNOSIS — D688 Other specified coagulation defects: Secondary | ICD-10-CM | POA: Diagnosis not present

## 2020-10-17 DIAGNOSIS — E876 Hypokalemia: Secondary | ICD-10-CM | POA: Diagnosis not present

## 2020-10-19 DIAGNOSIS — Z992 Dependence on renal dialysis: Secondary | ICD-10-CM | POA: Diagnosis not present

## 2020-10-19 DIAGNOSIS — N2581 Secondary hyperparathyroidism of renal origin: Secondary | ICD-10-CM | POA: Diagnosis not present

## 2020-10-19 DIAGNOSIS — E1129 Type 2 diabetes mellitus with other diabetic kidney complication: Secondary | ICD-10-CM | POA: Diagnosis not present

## 2020-10-19 DIAGNOSIS — E611 Iron deficiency: Secondary | ICD-10-CM | POA: Diagnosis not present

## 2020-10-19 DIAGNOSIS — T8249XD Other complication of vascular dialysis catheter, subsequent encounter: Secondary | ICD-10-CM | POA: Diagnosis not present

## 2020-10-19 DIAGNOSIS — D631 Anemia in chronic kidney disease: Secondary | ICD-10-CM | POA: Diagnosis not present

## 2020-10-19 DIAGNOSIS — N186 End stage renal disease: Secondary | ICD-10-CM | POA: Diagnosis not present

## 2020-10-19 DIAGNOSIS — D688 Other specified coagulation defects: Secondary | ICD-10-CM | POA: Diagnosis not present

## 2020-10-19 DIAGNOSIS — D689 Coagulation defect, unspecified: Secondary | ICD-10-CM | POA: Diagnosis not present

## 2020-10-19 DIAGNOSIS — E876 Hypokalemia: Secondary | ICD-10-CM | POA: Diagnosis not present

## 2020-10-19 DIAGNOSIS — R519 Headache, unspecified: Secondary | ICD-10-CM | POA: Diagnosis not present

## 2020-10-21 DIAGNOSIS — E1129 Type 2 diabetes mellitus with other diabetic kidney complication: Secondary | ICD-10-CM | POA: Diagnosis not present

## 2020-10-21 DIAGNOSIS — T8249XD Other complication of vascular dialysis catheter, subsequent encounter: Secondary | ICD-10-CM | POA: Diagnosis not present

## 2020-10-21 DIAGNOSIS — R519 Headache, unspecified: Secondary | ICD-10-CM | POA: Diagnosis not present

## 2020-10-21 DIAGNOSIS — Z992 Dependence on renal dialysis: Secondary | ICD-10-CM | POA: Diagnosis not present

## 2020-10-21 DIAGNOSIS — N186 End stage renal disease: Secondary | ICD-10-CM | POA: Diagnosis not present

## 2020-10-21 DIAGNOSIS — E876 Hypokalemia: Secondary | ICD-10-CM | POA: Diagnosis not present

## 2020-10-21 DIAGNOSIS — E611 Iron deficiency: Secondary | ICD-10-CM | POA: Diagnosis not present

## 2020-10-21 DIAGNOSIS — D631 Anemia in chronic kidney disease: Secondary | ICD-10-CM | POA: Diagnosis not present

## 2020-10-21 DIAGNOSIS — N2581 Secondary hyperparathyroidism of renal origin: Secondary | ICD-10-CM | POA: Diagnosis not present

## 2020-10-21 DIAGNOSIS — D688 Other specified coagulation defects: Secondary | ICD-10-CM | POA: Diagnosis not present

## 2020-10-21 DIAGNOSIS — D689 Coagulation defect, unspecified: Secondary | ICD-10-CM | POA: Diagnosis not present

## 2020-10-23 DIAGNOSIS — N186 End stage renal disease: Secondary | ICD-10-CM | POA: Diagnosis not present

## 2020-10-23 DIAGNOSIS — I129 Hypertensive chronic kidney disease with stage 1 through stage 4 chronic kidney disease, or unspecified chronic kidney disease: Secondary | ICD-10-CM | POA: Diagnosis not present

## 2020-10-23 DIAGNOSIS — Z992 Dependence on renal dialysis: Secondary | ICD-10-CM | POA: Diagnosis not present

## 2020-10-26 DIAGNOSIS — N186 End stage renal disease: Secondary | ICD-10-CM | POA: Diagnosis not present

## 2020-11-01 ENCOUNTER — Telehealth: Payer: Self-pay | Admitting: Student

## 2020-11-01 NOTE — Telephone Encounter (Signed)
I attempted to reach Susan Evans today to get her scheduled with the Managed Medicaid RNCM. I left my information on her VM for her to call me back. I will reach out again in the next 7-14 days if I have not heard back from her.

## 2020-11-06 ENCOUNTER — Encounter: Payer: Medicaid Other | Admitting: Internal Medicine

## 2020-11-13 ENCOUNTER — Encounter: Payer: Medicaid Other | Admitting: Internal Medicine

## 2020-11-20 DIAGNOSIS — I129 Hypertensive chronic kidney disease with stage 1 through stage 4 chronic kidney disease, or unspecified chronic kidney disease: Secondary | ICD-10-CM | POA: Diagnosis not present

## 2020-11-20 DIAGNOSIS — N186 End stage renal disease: Secondary | ICD-10-CM | POA: Diagnosis not present

## 2020-11-20 DIAGNOSIS — Z992 Dependence on renal dialysis: Secondary | ICD-10-CM | POA: Diagnosis not present

## 2020-11-21 DIAGNOSIS — Z992 Dependence on renal dialysis: Secondary | ICD-10-CM | POA: Diagnosis not present

## 2020-11-21 DIAGNOSIS — N2581 Secondary hyperparathyroidism of renal origin: Secondary | ICD-10-CM | POA: Diagnosis not present

## 2020-11-21 DIAGNOSIS — D688 Other specified coagulation defects: Secondary | ICD-10-CM | POA: Diagnosis not present

## 2020-11-21 DIAGNOSIS — T8249XD Other complication of vascular dialysis catheter, subsequent encounter: Secondary | ICD-10-CM | POA: Diagnosis not present

## 2020-11-21 DIAGNOSIS — N186 End stage renal disease: Secondary | ICD-10-CM | POA: Diagnosis not present

## 2020-11-21 DIAGNOSIS — D689 Coagulation defect, unspecified: Secondary | ICD-10-CM | POA: Diagnosis not present

## 2020-11-22 ENCOUNTER — Other Ambulatory Visit: Payer: Self-pay | Admitting: Student

## 2020-11-22 DIAGNOSIS — Z794 Long term (current) use of insulin: Secondary | ICD-10-CM

## 2020-11-22 DIAGNOSIS — E119 Type 2 diabetes mellitus without complications: Secondary | ICD-10-CM

## 2020-11-23 DIAGNOSIS — N2581 Secondary hyperparathyroidism of renal origin: Secondary | ICD-10-CM | POA: Diagnosis not present

## 2020-11-23 DIAGNOSIS — T8249XD Other complication of vascular dialysis catheter, subsequent encounter: Secondary | ICD-10-CM | POA: Diagnosis not present

## 2020-11-23 DIAGNOSIS — N186 End stage renal disease: Secondary | ICD-10-CM | POA: Diagnosis not present

## 2020-11-23 DIAGNOSIS — D689 Coagulation defect, unspecified: Secondary | ICD-10-CM | POA: Diagnosis not present

## 2020-11-23 DIAGNOSIS — Z992 Dependence on renal dialysis: Secondary | ICD-10-CM | POA: Diagnosis not present

## 2020-11-23 DIAGNOSIS — D688 Other specified coagulation defects: Secondary | ICD-10-CM | POA: Diagnosis not present

## 2020-11-25 DIAGNOSIS — D688 Other specified coagulation defects: Secondary | ICD-10-CM | POA: Diagnosis not present

## 2020-11-25 DIAGNOSIS — T8249XD Other complication of vascular dialysis catheter, subsequent encounter: Secondary | ICD-10-CM | POA: Diagnosis not present

## 2020-11-25 DIAGNOSIS — D689 Coagulation defect, unspecified: Secondary | ICD-10-CM | POA: Diagnosis not present

## 2020-11-25 DIAGNOSIS — N186 End stage renal disease: Secondary | ICD-10-CM | POA: Diagnosis not present

## 2020-11-25 DIAGNOSIS — N2581 Secondary hyperparathyroidism of renal origin: Secondary | ICD-10-CM | POA: Diagnosis not present

## 2020-11-25 DIAGNOSIS — Z992 Dependence on renal dialysis: Secondary | ICD-10-CM | POA: Diagnosis not present

## 2020-11-28 DIAGNOSIS — D689 Coagulation defect, unspecified: Secondary | ICD-10-CM | POA: Diagnosis not present

## 2020-11-28 DIAGNOSIS — Z992 Dependence on renal dialysis: Secondary | ICD-10-CM | POA: Diagnosis not present

## 2020-11-28 DIAGNOSIS — N2581 Secondary hyperparathyroidism of renal origin: Secondary | ICD-10-CM | POA: Diagnosis not present

## 2020-11-28 DIAGNOSIS — D688 Other specified coagulation defects: Secondary | ICD-10-CM | POA: Diagnosis not present

## 2020-11-28 DIAGNOSIS — N186 End stage renal disease: Secondary | ICD-10-CM | POA: Diagnosis not present

## 2020-11-28 DIAGNOSIS — T8249XD Other complication of vascular dialysis catheter, subsequent encounter: Secondary | ICD-10-CM | POA: Diagnosis not present

## 2020-11-30 DIAGNOSIS — N2581 Secondary hyperparathyroidism of renal origin: Secondary | ICD-10-CM | POA: Diagnosis not present

## 2020-11-30 DIAGNOSIS — D688 Other specified coagulation defects: Secondary | ICD-10-CM | POA: Diagnosis not present

## 2020-11-30 DIAGNOSIS — N186 End stage renal disease: Secondary | ICD-10-CM | POA: Diagnosis not present

## 2020-11-30 DIAGNOSIS — Z992 Dependence on renal dialysis: Secondary | ICD-10-CM | POA: Diagnosis not present

## 2020-11-30 DIAGNOSIS — T8249XD Other complication of vascular dialysis catheter, subsequent encounter: Secondary | ICD-10-CM | POA: Diagnosis not present

## 2020-11-30 DIAGNOSIS — D689 Coagulation defect, unspecified: Secondary | ICD-10-CM | POA: Diagnosis not present

## 2020-12-02 DIAGNOSIS — T8249XD Other complication of vascular dialysis catheter, subsequent encounter: Secondary | ICD-10-CM | POA: Diagnosis not present

## 2020-12-02 DIAGNOSIS — D688 Other specified coagulation defects: Secondary | ICD-10-CM | POA: Diagnosis not present

## 2020-12-02 DIAGNOSIS — N2581 Secondary hyperparathyroidism of renal origin: Secondary | ICD-10-CM | POA: Diagnosis not present

## 2020-12-02 DIAGNOSIS — N186 End stage renal disease: Secondary | ICD-10-CM | POA: Diagnosis not present

## 2020-12-02 DIAGNOSIS — Z992 Dependence on renal dialysis: Secondary | ICD-10-CM | POA: Diagnosis not present

## 2020-12-02 DIAGNOSIS — D689 Coagulation defect, unspecified: Secondary | ICD-10-CM | POA: Diagnosis not present

## 2020-12-03 ENCOUNTER — Other Ambulatory Visit: Payer: Self-pay | Admitting: Student

## 2020-12-03 DIAGNOSIS — I10 Essential (primary) hypertension: Secondary | ICD-10-CM

## 2020-12-04 ENCOUNTER — Other Ambulatory Visit: Payer: Self-pay

## 2020-12-04 DIAGNOSIS — N186 End stage renal disease: Secondary | ICD-10-CM

## 2020-12-05 DIAGNOSIS — D688 Other specified coagulation defects: Secondary | ICD-10-CM | POA: Diagnosis not present

## 2020-12-05 DIAGNOSIS — E876 Hypokalemia: Secondary | ICD-10-CM | POA: Diagnosis not present

## 2020-12-05 DIAGNOSIS — N186 End stage renal disease: Secondary | ICD-10-CM | POA: Diagnosis not present

## 2020-12-05 DIAGNOSIS — Z992 Dependence on renal dialysis: Secondary | ICD-10-CM | POA: Diagnosis not present

## 2020-12-05 DIAGNOSIS — D689 Coagulation defect, unspecified: Secondary | ICD-10-CM | POA: Diagnosis not present

## 2020-12-05 DIAGNOSIS — N2581 Secondary hyperparathyroidism of renal origin: Secondary | ICD-10-CM | POA: Diagnosis not present

## 2020-12-05 DIAGNOSIS — T8249XD Other complication of vascular dialysis catheter, subsequent encounter: Secondary | ICD-10-CM | POA: Diagnosis not present

## 2020-12-07 DIAGNOSIS — T8249XD Other complication of vascular dialysis catheter, subsequent encounter: Secondary | ICD-10-CM | POA: Diagnosis not present

## 2020-12-07 DIAGNOSIS — Z992 Dependence on renal dialysis: Secondary | ICD-10-CM | POA: Diagnosis not present

## 2020-12-07 DIAGNOSIS — D688 Other specified coagulation defects: Secondary | ICD-10-CM | POA: Diagnosis not present

## 2020-12-07 DIAGNOSIS — D689 Coagulation defect, unspecified: Secondary | ICD-10-CM | POA: Diagnosis not present

## 2020-12-07 DIAGNOSIS — N2581 Secondary hyperparathyroidism of renal origin: Secondary | ICD-10-CM | POA: Diagnosis not present

## 2020-12-07 DIAGNOSIS — E876 Hypokalemia: Secondary | ICD-10-CM | POA: Diagnosis not present

## 2020-12-07 DIAGNOSIS — N186 End stage renal disease: Secondary | ICD-10-CM | POA: Diagnosis not present

## 2020-12-08 ENCOUNTER — Ambulatory Visit (HOSPITAL_COMMUNITY)
Admission: RE | Admit: 2020-12-08 | Discharge: 2020-12-08 | Disposition: A | Discharge status: Home / Self Care | Payer: Medicaid Other | Source: Ambulatory Visit | Attending: Vascular Surgery | Admitting: Vascular Surgery

## 2020-12-08 ENCOUNTER — Encounter: Payer: Self-pay | Admitting: Vascular Surgery

## 2020-12-08 ENCOUNTER — Ambulatory Visit (INDEPENDENT_AMBULATORY_CARE_PROVIDER_SITE_OTHER): Payer: Medicaid Other | Admitting: Vascular Surgery

## 2020-12-08 ENCOUNTER — Other Ambulatory Visit: Payer: Self-pay

## 2020-12-08 VITALS — BP 121/65 | HR 99 | Temp 97.9°F | Resp 20 | Ht 62.0 in | Wt 86.3 lb

## 2020-12-08 DIAGNOSIS — N186 End stage renal disease: Secondary | ICD-10-CM | POA: Diagnosis not present

## 2020-12-08 NOTE — H&P (View-Only) (Signed)
Patient ID: Susan Evans, female   DOB: Aug 26, 1956, 65 y.o.   MRN: [id number]  Reason for Consult: Follow-up   Referred by Alexandria Lodge, MD  Subjective:     HPI:  Susan Evans is a 65 y.o. female right-hand-dominant patient currently on dialysis via left IJ catheter.  She did have breast surgery in the past did not have any lymph nodes taken she does not think.  She is not on any blood thinners.  Currently on dialysis Tuesdays Thursdays and Saturdays.  Previously had a left arm graft it did not last for very long.  She is not having any upper extremity pain at this time.  Past Medical History:  Diagnosis Date  . AKI (acute kidney injury) ([place]) 08/12/2019  . Chronic kidney disease    Dialysis T/Th/Sat  . 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 Diabetes mellitus    type 2   . Ganglion cyst    right wrist  . GERD (gastroesophageal reflux disease)   . History of radiation therapy 02/27/10 -04/13/10   left base of tongue, bilat neck  . Hyperlipidemia   . Hypertension   . Iron deficiency anemia   . 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 Menopause   . Thyroid disease    Hyperthyroidism  . Tobacco abuse    PT HAS QUIT   Family History  Problem Relation Age of Onset  . Stroke Father   . Cancer Father        unknown ca  . Hypertension Mother   . Diabetes Mother   . Chronic Renal Failure Sister   . Chronic Renal Failure Brother   . Cancer Paternal Aunt        colon ca  . Breast cancer Neg Hx    Past Surgical History:  Procedure Laterality Date  . AV FISTULA PLACEMENT Left 06/02/2020   Procedure: INSERTION OF ARTERIOVENOUS (AV) GORE-TEX GRAFT ARM LEFT;  Surgeon: Marty Heck, MD;  Location: [facility];  Service: Vascular;  Laterality: Left;  . IR FLUORO GUIDE CV LINE LEFT  05/15/2020  . IR REMOVAL TUN CV CATH W/O FL  05/15/2020  . IR US GUIDE VASC ACCESS LEFT  05/15/2020  .  NECK SURGERY    . REDUCTION MAMMAPLASTY      Short Social History:  Social History   Tobacco Use  . Smoking status: Former Smoker    Packs/day: 0.30    Quit date: 02/09/2009    Years since quitting: 11.8  . Smokeless tobacco: Never Used  . Tobacco comment: 1/3 pk every 3 days  Substance Use Topics  . Alcohol use: No    Alcohol/week: 0.0 standard drinks    Allergies  Allergen Reactions  . Hydrochlorothiazide Itching and Other (See Comments)    Hyponatremia   . Penicillins Other (See Comments)    "takes all of her hair out" .Has patient had a PCN reaction causing immediate rash, facial/tongue/throat swelling, SOB or lightheadedness with hypotension: No Has patient had a PCN reaction causing severe rash involving mucus membranes or skin necrosis: No Has patient had a PCN reaction that required hospitalization No Has patient had a PCN reaction occurring within the last 10 years: No If all of the above answers are "NO", then may proceed with Cephalosporin use.     Current Outpatient Medications  Medication Sig Dispense Refill  . Accu-Chek FastClix Lancets MISC  5 (FIVE) TIMES DAILY DIAGNOSIS CODE E11.9 204 each 8  . atorvastatin (LIPITOR) 40 MG tablet Take 1 tablet (40 mg total) by mouth daily. 90 tablet 3  . Blood Glucose Monitoring Suppl (ACCU-CHEK AVIVA PLUS) w/Device KIT Use to test blood glucose  3 times daily 1 kit 0  . calcium-vitamin D (CALCIUM 500+D HIGH POTENCY) 500-400 MG-UNIT tablet Take 1 tablet by mouth daily. 90 tablet 3  . Cholecalciferol (CVS D3) 50 MCG (2000 UT) CAPS Take 1 capsule (2,000 Units total) by mouth daily. 90 capsule 3  . cloNIDine (CATAPRES - DOSED IN MG/24 HR) 0.2 mg/24hr patch PLACE 1 PATCH (0.2 MG TOTAL) ONTO THE SKIN ONCE A WEEK. 4 patch 3  . glucose blood (ACCU-CHEK GUIDE) test strip TEST 5 (FIVE) TIMES DAILY DIAGNOSIS CODE E11.9 150 strip 12  . hydrALAZINE (APRESOLINE) 50 MG tablet TAKE 1 TABLET BY MOUTH TWICE A DAY 60 tablet 3  . insulin  aspart (NOVOLOG FLEXPEN) 100 UNIT/ML FlexPen INJECT 5 units WITH BREAKFAST, 5 units WITH LUNCH, AND 5 units WITH DINNER 15 mL 6  . insulin glargine (LANTUS) 100 UNIT/ML injection INJECT 0.2ML INTO THE SKIN TWICE A DAY, 15 UNITS IN AM AND 15 UNITS IN EVENING (Patient taking differently: Inject 15 Units into the skin in the morning and at bedtime.) 20 mL 4  . Insulin Pen Needle (B-D UF III MINI PEN NEEDLES) 31G X 5 MM MISC USE TO INJECT NOVOLOG 3 TIMES A DAY. DIAGNOSIS CODE E11.9, Z79.4 100 each 8  . Insulin Syringe-Needle U-100 31G X 15/64" 0.3 ML MISC USE TO INJECT LANTUS INSULIN TWO TIMES A DAY 180 each 3  . loratadine (CLARITIN) 10 MG tablet Take 1 tablet (10 mg total) by mouth daily. 30 tablet 2  . omeprazole (PRILOSEC) 20 MG capsule TAKE 1 CAPSULE BY MOUTH EVERY DAY 90 capsule 1  . Polyethyl Glycol-Propyl Glycol (SYSTANE OP) Apply 1 drop to eye daily as needed (for dry eyes).    . verapamil (CALAN-SR) 180 MG CR tablet TAKE 2 TABLETS (360 MG TOTAL) BY MOUTH AT BEDTIME. 60 tablet 3   No current facility-administered medications for this visit.    Review of Systems  Constitutional:  Constitutional negative. HENT: HENT negative.  Eyes: Eyes negative.  Respiratory: Respiratory negative.  Cardiovascular: Cardiovascular negative.  GI: Gastrointestinal negative.  Musculoskeletal: Musculoskeletal negative.  Skin: Skin negative.  Neurological: Neurological negative. Hematologic: Hematologic/lymphatic negative.  Psychiatric: Psychiatric negative.        Objective:  Objective   Vitals:   12/08/20 1346  BP: 121/65  Pulse: 99  Resp: 20  Temp: 97.9 F (36.6 C)  SpO2: 95%  Weight: 86 lb 4.8 oz (39.1 kg)  Height: _0  (1.575 m)   Body mass index is 15.78 kg/m.  Physical Exam HENT:     Head: Normocephalic.     Nose:     Comments: Wearing a mask Eyes:     Pupils: Pupils are equal, round, and reactive to light.  Cardiovascular:     Rate and Rhythm: Normal rate.     Pulses:           Radial pulses are 2+ on the right side and 2+ on the left side.  Pulmonary:     Effort: Pulmonary effort is normal.  Abdominal:     General: Abdomen is flat.     Palpations: Abdomen is soft.  Musculoskeletal:        General: Normal range of motion.     Cervical back:  Normal range of motion and neck supple.     Right lower leg: Edema present.     Left lower leg: Edema present.  Skin:    General: Skin is warm.     Capillary Refill: Capillary refill takes less than 2 seconds.  Neurological:     General: No focal deficit present.     Mental Status: She is alert.  Psychiatric:        Mood and Affect: Mood normal.        Behavior: Behavior normal.        Thought Content: Thought content normal.        Judgment: Judgment normal.     Data: +--------------------+----------+-----------------+---------+  AVG         PSV (cm/s)Flow Vol (mL/min)Describe   +--------------------+----------+-----------------+---------+  Native artery inflow  50           triphasic  +--------------------+----------+-----------------+---------+  Arterial anastomosis              occluded   +--------------------+----------+-----------------+---------+  Prox graft                   occluded   +--------------------+----------+-----------------+---------+  Mid graft                    occluded   +--------------------+----------+-----------------+---------+  Distal graft                  occluded   +--------------------+----------+-----------------+---------+  Venous anastomosis               occluded   +--------------------+----------+-----------------+---------+  Venous outflow                 occluded   +--------------------+----------+-----------------+---------+    Summary:  Thrombosed left arm straight AVG. The outflow axillary vein appears   thrombosed.         Assessment/Plan:     65 year old female with history of left upper extremity AV graft that failed after several uses.  Currently dialyzing Tuesday Thursday Saturdays via catheter.  She is not on any blood thinners.  I discussed with her the options being either venogram to plan further left upper extremity access although I think this would be difficult given her previous incision and her very small arm.  She has agreed to right upper extremity AV fistula versus more likely graft given the previous vein mapping did not demonstrate any suitable vein.  I have again discussed the risk and benefits as well as alternatives and she demonstrates good understanding we will get her scheduled on a nondialysis day in the near future.     Waynetta Sandy MD [facility]

## 2020-12-08 NOTE — Progress Notes (Signed)
Patient ID: Susan Evans, female   DOB: Aug 26, 1956, 65 y.o.   MRN: [id number]  Reason for Consult: Follow-up   Referred by Alexandria Lodge, MD  Subjective:     HPI:  Susan Evans is a 65 y.o. female right-hand-dominant patient currently on dialysis via left IJ catheter.  She did have breast surgery in the past did not have any lymph nodes taken she does not think.  She is not on any blood thinners.  Currently on dialysis Tuesdays Thursdays and Saturdays.  Previously had a left arm graft it did not last for very long.  She is not having any upper extremity pain at this time.  Past Medical History:  Diagnosis Date  . AKI (acute kidney injury) ([place]) 08/12/2019  . Chronic kidney disease    Dialysis T/Th/Sat  . 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 Diabetes mellitus    type 2   . Ganglion cyst    right wrist  . GERD (gastroesophageal reflux disease)   . History of radiation therapy 02/27/10 -04/13/10   left base of tongue, bilat neck  . Hyperlipidemia   . Hypertension   . Iron deficiency anemia   . 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 Menopause   . Thyroid disease    Hyperthyroidism  . Tobacco abuse    PT HAS QUIT   Family History  Problem Relation Age of Onset  . Stroke Father   . Cancer Father        unknown ca  . Hypertension Mother   . Diabetes Mother   . Chronic Renal Failure Sister   . Chronic Renal Failure Brother   . Cancer Paternal Aunt        colon ca  . Breast cancer Neg Hx    Past Surgical History:  Procedure Laterality Date  . AV FISTULA PLACEMENT Left 06/02/2020   Procedure: INSERTION OF ARTERIOVENOUS (AV) GORE-TEX GRAFT ARM LEFT;  Surgeon: Marty Heck, MD;  Location: [facility];  Service: Vascular;  Laterality: Left;  . IR FLUORO GUIDE CV LINE LEFT  05/15/2020  . IR REMOVAL TUN CV CATH W/O FL  05/15/2020  . IR US GUIDE VASC ACCESS LEFT  05/15/2020  .  NECK SURGERY    . REDUCTION MAMMAPLASTY      Short Social History:  Social History   Tobacco Use  . Smoking status: Former Smoker    Packs/day: 0.30    Quit date: 02/09/2009    Years since quitting: 11.8  . Smokeless tobacco: Never Used  . Tobacco comment: 1/3 pk every 3 days  Substance Use Topics  . Alcohol use: No    Alcohol/week: 0.0 standard drinks    Allergies  Allergen Reactions  . Hydrochlorothiazide Itching and Other (See Comments)    Hyponatremia   . Penicillins Other (See Comments)    "takes all of her hair out" .Has patient had a PCN reaction causing immediate rash, facial/tongue/throat swelling, SOB or lightheadedness with hypotension: No Has patient had a PCN reaction causing severe rash involving mucus membranes or skin necrosis: No Has patient had a PCN reaction that required hospitalization No Has patient had a PCN reaction occurring within the last 10 years: No If all of the above answers are "NO", then may proceed with Cephalosporin use.     Current Outpatient Medications  Medication Sig Dispense Refill  . Accu-Chek FastClix Lancets MISC  5 (FIVE) TIMES DAILY DIAGNOSIS CODE E11.9 204 each 8  . atorvastatin (LIPITOR) 40 MG tablet Take 1 tablet (40 mg total) by mouth daily. 90 tablet 3  . Blood Glucose Monitoring Suppl (ACCU-CHEK AVIVA PLUS) w/Device KIT Use to test blood glucose  3 times daily 1 kit 0  . calcium-vitamin D (CALCIUM 500+D HIGH POTENCY) 500-400 MG-UNIT tablet Take 1 tablet by mouth daily. 90 tablet 3  . Cholecalciferol (CVS D3) 50 MCG (2000 UT) CAPS Take 1 capsule (2,000 Units total) by mouth daily. 90 capsule 3  . cloNIDine (CATAPRES - DOSED IN MG/24 HR) 0.2 mg/24hr patch PLACE 1 PATCH (0.2 MG TOTAL) ONTO THE SKIN ONCE A WEEK. 4 patch 3  . glucose blood (ACCU-CHEK GUIDE) test strip TEST 5 (FIVE) TIMES DAILY DIAGNOSIS CODE E11.9 150 strip 12  . hydrALAZINE (APRESOLINE) 50 MG tablet TAKE 1 TABLET BY MOUTH TWICE A DAY 60 tablet 3  . insulin  aspart (NOVOLOG FLEXPEN) 100 UNIT/ML FlexPen INJECT 5 units WITH BREAKFAST, 5 units WITH LUNCH, AND 5 units WITH DINNER 15 mL 6  . insulin glargine (LANTUS) 100 UNIT/ML injection INJECT 0.2ML INTO THE SKIN TWICE A DAY, 15 UNITS IN AM AND 15 UNITS IN EVENING (Patient taking differently: Inject 15 Units into the skin in the morning and at bedtime.) 20 mL 4  . Insulin Pen Needle (B-D UF III MINI PEN NEEDLES) 31G X 5 MM MISC USE TO INJECT NOVOLOG 3 TIMES A DAY. DIAGNOSIS CODE E11.9, Z79.4 100 each 8  . Insulin Syringe-Needle U-100 31G X 15/64" 0.3 ML MISC USE TO INJECT LANTUS INSULIN TWO TIMES A DAY 180 each 3  . loratadine (CLARITIN) 10 MG tablet Take 1 tablet (10 mg total) by mouth daily. 30 tablet 2  . omeprazole (PRILOSEC) 20 MG capsule TAKE 1 CAPSULE BY MOUTH EVERY DAY 90 capsule 1  . Polyethyl Glycol-Propyl Glycol (SYSTANE OP) Apply 1 drop to eye daily as needed (for dry eyes).    . verapamil (CALAN-SR) 180 MG CR tablet TAKE 2 TABLETS (360 MG TOTAL) BY MOUTH AT BEDTIME. 60 tablet 3   No current facility-administered medications for this visit.    Review of Systems  Constitutional:  Constitutional negative. HENT: HENT negative.  Eyes: Eyes negative.  Respiratory: Respiratory negative.  Cardiovascular: Cardiovascular negative.  GI: Gastrointestinal negative.  Musculoskeletal: Musculoskeletal negative.  Skin: Skin negative.  Neurological: Neurological negative. Hematologic: Hematologic/lymphatic negative.  Psychiatric: Psychiatric negative.        Objective:  Objective   Vitals:   12/08/20 1346  BP: 121/65  Pulse: 99  Resp: 20  Temp: 97.9 F (36.6 C)  SpO2: 95%  Weight: 86 lb 4.8 oz (39.1 kg)  Height: _0  (1.575 m)   Body mass index is 15.78 kg/m.  Physical Exam HENT:     Head: Normocephalic.     Nose:     Comments: Wearing a mask Eyes:     Pupils: Pupils are equal, round, and reactive to light.  Cardiovascular:     Rate and Rhythm: Normal rate.     Pulses:           Radial pulses are 2+ on the right side and 2+ on the left side.  Pulmonary:     Effort: Pulmonary effort is normal.  Abdominal:     General: Abdomen is flat.     Palpations: Abdomen is soft.  Musculoskeletal:        General: Normal range of motion.     Cervical back:  Normal range of motion and neck supple.     Right lower leg: Edema present.     Left lower leg: Edema present.  Skin:    General: Skin is warm.     Capillary Refill: Capillary refill takes less than 2 seconds.  Neurological:     General: No focal deficit present.     Mental Status: She is alert.  Psychiatric:        Mood and Affect: Mood normal.        Behavior: Behavior normal.        Thought Content: Thought content normal.        Judgment: Judgment normal.     Data: +--------------------+----------+-----------------+---------+  AVG         PSV (cm/s)Flow Vol (mL/min)Describe   +--------------------+----------+-----------------+---------+  Native artery inflow  50           triphasic  +--------------------+----------+-----------------+---------+  Arterial anastomosis              occluded   +--------------------+----------+-----------------+---------+  Prox graft                   occluded   +--------------------+----------+-----------------+---------+  Mid graft                    occluded   +--------------------+----------+-----------------+---------+  Distal graft                  occluded   +--------------------+----------+-----------------+---------+  Venous anastomosis               occluded   +--------------------+----------+-----------------+---------+  Venous outflow                 occluded   +--------------------+----------+-----------------+---------+    Summary:  Thrombosed left arm straight AVG. The outflow axillary vein appears   thrombosed.         Assessment/Plan:     65 year old female with history of left upper extremity AV graft that failed after several uses.  Currently dialyzing Tuesday Thursday Saturdays via catheter.  She is not on any blood thinners.  I discussed with her the options being either venogram to plan further left upper extremity access although I think this would be difficult given her previous incision and her very small arm.  She has agreed to right upper extremity AV fistula versus more likely graft given the previous vein mapping did not demonstrate any suitable vein.  I have again discussed the risk and benefits as well as alternatives and she demonstrates good understanding we will get her scheduled on a nondialysis day in the near future.     Waynetta Sandy MD [facility]

## 2020-12-09 DIAGNOSIS — N186 End stage renal disease: Secondary | ICD-10-CM | POA: Diagnosis not present

## 2020-12-09 DIAGNOSIS — N2581 Secondary hyperparathyroidism of renal origin: Secondary | ICD-10-CM | POA: Diagnosis not present

## 2020-12-09 DIAGNOSIS — E876 Hypokalemia: Secondary | ICD-10-CM | POA: Diagnosis not present

## 2020-12-09 DIAGNOSIS — T8249XD Other complication of vascular dialysis catheter, subsequent encounter: Secondary | ICD-10-CM | POA: Diagnosis not present

## 2020-12-09 DIAGNOSIS — D688 Other specified coagulation defects: Secondary | ICD-10-CM | POA: Diagnosis not present

## 2020-12-09 DIAGNOSIS — D689 Coagulation defect, unspecified: Secondary | ICD-10-CM | POA: Diagnosis not present

## 2020-12-09 DIAGNOSIS — Z992 Dependence on renal dialysis: Secondary | ICD-10-CM | POA: Diagnosis not present

## 2020-12-12 DIAGNOSIS — E876 Hypokalemia: Secondary | ICD-10-CM | POA: Diagnosis not present

## 2020-12-12 DIAGNOSIS — D688 Other specified coagulation defects: Secondary | ICD-10-CM | POA: Diagnosis not present

## 2020-12-12 DIAGNOSIS — D689 Coagulation defect, unspecified: Secondary | ICD-10-CM | POA: Diagnosis not present

## 2020-12-12 DIAGNOSIS — N2581 Secondary hyperparathyroidism of renal origin: Secondary | ICD-10-CM | POA: Diagnosis not present

## 2020-12-12 DIAGNOSIS — N186 End stage renal disease: Secondary | ICD-10-CM | POA: Diagnosis not present

## 2020-12-12 DIAGNOSIS — T8249XD Other complication of vascular dialysis catheter, subsequent encounter: Secondary | ICD-10-CM | POA: Diagnosis not present

## 2020-12-12 DIAGNOSIS — Z992 Dependence on renal dialysis: Secondary | ICD-10-CM | POA: Diagnosis not present

## 2020-12-14 DIAGNOSIS — D688 Other specified coagulation defects: Secondary | ICD-10-CM | POA: Diagnosis not present

## 2020-12-14 DIAGNOSIS — T8249XD Other complication of vascular dialysis catheter, subsequent encounter: Secondary | ICD-10-CM | POA: Diagnosis not present

## 2020-12-14 DIAGNOSIS — N186 End stage renal disease: Secondary | ICD-10-CM | POA: Diagnosis not present

## 2020-12-14 DIAGNOSIS — N2581 Secondary hyperparathyroidism of renal origin: Secondary | ICD-10-CM | POA: Diagnosis not present

## 2020-12-14 DIAGNOSIS — D689 Coagulation defect, unspecified: Secondary | ICD-10-CM | POA: Diagnosis not present

## 2020-12-14 DIAGNOSIS — Z992 Dependence on renal dialysis: Secondary | ICD-10-CM | POA: Diagnosis not present

## 2020-12-14 DIAGNOSIS — E876 Hypokalemia: Secondary | ICD-10-CM | POA: Diagnosis not present

## 2020-12-16 DIAGNOSIS — D689 Coagulation defect, unspecified: Secondary | ICD-10-CM | POA: Diagnosis not present

## 2020-12-16 DIAGNOSIS — E876 Hypokalemia: Secondary | ICD-10-CM | POA: Diagnosis not present

## 2020-12-16 DIAGNOSIS — N2581 Secondary hyperparathyroidism of renal origin: Secondary | ICD-10-CM | POA: Diagnosis not present

## 2020-12-16 DIAGNOSIS — D688 Other specified coagulation defects: Secondary | ICD-10-CM | POA: Diagnosis not present

## 2020-12-16 DIAGNOSIS — N186 End stage renal disease: Secondary | ICD-10-CM | POA: Diagnosis not present

## 2020-12-16 DIAGNOSIS — T8249XD Other complication of vascular dialysis catheter, subsequent encounter: Secondary | ICD-10-CM | POA: Diagnosis not present

## 2020-12-16 DIAGNOSIS — Z992 Dependence on renal dialysis: Secondary | ICD-10-CM | POA: Diagnosis not present

## 2020-12-19 DIAGNOSIS — N2581 Secondary hyperparathyroidism of renal origin: Secondary | ICD-10-CM | POA: Diagnosis not present

## 2020-12-19 DIAGNOSIS — Z992 Dependence on renal dialysis: Secondary | ICD-10-CM | POA: Diagnosis not present

## 2020-12-19 DIAGNOSIS — D688 Other specified coagulation defects: Secondary | ICD-10-CM | POA: Diagnosis not present

## 2020-12-19 DIAGNOSIS — N186 End stage renal disease: Secondary | ICD-10-CM | POA: Diagnosis not present

## 2020-12-19 DIAGNOSIS — D689 Coagulation defect, unspecified: Secondary | ICD-10-CM | POA: Diagnosis not present

## 2020-12-19 DIAGNOSIS — T8249XD Other complication of vascular dialysis catheter, subsequent encounter: Secondary | ICD-10-CM | POA: Diagnosis not present

## 2020-12-19 DIAGNOSIS — E876 Hypokalemia: Secondary | ICD-10-CM | POA: Diagnosis not present

## 2020-12-21 DIAGNOSIS — T8249XD Other complication of vascular dialysis catheter, subsequent encounter: Secondary | ICD-10-CM | POA: Diagnosis not present

## 2020-12-21 DIAGNOSIS — N2581 Secondary hyperparathyroidism of renal origin: Secondary | ICD-10-CM | POA: Diagnosis not present

## 2020-12-21 DIAGNOSIS — Z992 Dependence on renal dialysis: Secondary | ICD-10-CM | POA: Diagnosis not present

## 2020-12-21 DIAGNOSIS — D688 Other specified coagulation defects: Secondary | ICD-10-CM | POA: Diagnosis not present

## 2020-12-21 DIAGNOSIS — I129 Hypertensive chronic kidney disease with stage 1 through stage 4 chronic kidney disease, or unspecified chronic kidney disease: Secondary | ICD-10-CM | POA: Diagnosis not present

## 2020-12-21 DIAGNOSIS — N186 End stage renal disease: Secondary | ICD-10-CM | POA: Diagnosis not present

## 2020-12-21 DIAGNOSIS — D689 Coagulation defect, unspecified: Secondary | ICD-10-CM | POA: Diagnosis not present

## 2020-12-21 DIAGNOSIS — E876 Hypokalemia: Secondary | ICD-10-CM | POA: Diagnosis not present

## 2020-12-23 DIAGNOSIS — D688 Other specified coagulation defects: Secondary | ICD-10-CM | POA: Diagnosis not present

## 2020-12-23 DIAGNOSIS — T8249XD Other complication of vascular dialysis catheter, subsequent encounter: Secondary | ICD-10-CM | POA: Diagnosis not present

## 2020-12-23 DIAGNOSIS — Z992 Dependence on renal dialysis: Secondary | ICD-10-CM | POA: Diagnosis not present

## 2020-12-23 DIAGNOSIS — N186 End stage renal disease: Secondary | ICD-10-CM | POA: Diagnosis not present

## 2020-12-23 DIAGNOSIS — N2581 Secondary hyperparathyroidism of renal origin: Secondary | ICD-10-CM | POA: Diagnosis not present

## 2020-12-23 DIAGNOSIS — D689 Coagulation defect, unspecified: Secondary | ICD-10-CM | POA: Diagnosis not present

## 2020-12-24 DIAGNOSIS — N186 End stage renal disease: Secondary | ICD-10-CM | POA: Diagnosis not present

## 2020-12-26 ENCOUNTER — Other Ambulatory Visit: Payer: Self-pay

## 2020-12-26 ENCOUNTER — Other Ambulatory Visit (HOSPITAL_COMMUNITY)
Admission: RE | Admit: 2020-12-26 | Discharge: 2020-12-26 | Disposition: A | Discharge status: Home / Self Care | Payer: Medicaid Other | Source: Ambulatory Visit | Attending: Vascular Surgery | Admitting: Vascular Surgery

## 2020-12-26 ENCOUNTER — Encounter (HOSPITAL_COMMUNITY): Payer: Self-pay | Admitting: Vascular Surgery

## 2020-12-26 DIAGNOSIS — D688 Other specified coagulation defects: Secondary | ICD-10-CM | POA: Diagnosis not present

## 2020-12-26 DIAGNOSIS — Z20822 Contact with and (suspected) exposure to covid-19: Secondary | ICD-10-CM | POA: Insufficient documentation

## 2020-12-26 DIAGNOSIS — Z992 Dependence on renal dialysis: Secondary | ICD-10-CM | POA: Diagnosis not present

## 2020-12-26 DIAGNOSIS — T8249XD Other complication of vascular dialysis catheter, subsequent encounter: Secondary | ICD-10-CM | POA: Diagnosis not present

## 2020-12-26 DIAGNOSIS — N186 End stage renal disease: Secondary | ICD-10-CM | POA: Diagnosis not present

## 2020-12-26 DIAGNOSIS — N2581 Secondary hyperparathyroidism of renal origin: Secondary | ICD-10-CM | POA: Diagnosis not present

## 2020-12-26 DIAGNOSIS — Z01812 Encounter for preprocedural laboratory examination: Secondary | ICD-10-CM | POA: Diagnosis not present

## 2020-12-26 DIAGNOSIS — E1129 Type 2 diabetes mellitus with other diabetic kidney complication: Secondary | ICD-10-CM | POA: Diagnosis not present

## 2020-12-26 DIAGNOSIS — D689 Coagulation defect, unspecified: Secondary | ICD-10-CM | POA: Diagnosis not present

## 2020-12-26 NOTE — Progress Notes (Signed)
Ms Susan Evans denies chest pain or shortness of breath. Patient was tested for Covid and has been in quarantine since that time.  [name] has type II diabetes, patient reports that CBG's run1 20 - 150.  I instructed [name] to take 7 units of Lantus insulin tonight and if am if CBG is > 70 take 7 units of lantus insulin.I instructed patient to check CBG after awaking and every 2 hours until arrival  to the hospital.  I Instructed patient if CBG is less than 70 to take 4 Glucose Tablets or 1 tube of Glucose Gel or 1/2 cup of a clear juice. Recheck CBG in 15 minutes if CBG is not over 70 call, pre- op desk at [phone] for further instructions. If scheduled to receive Insulin, do not take Insulin

## 2020-12-27 ENCOUNTER — Ambulatory Visit (HOSPITAL_COMMUNITY): Payer: Medicaid Other | Admitting: Anesthesiology

## 2020-12-27 ENCOUNTER — Encounter (HOSPITAL_COMMUNITY)
Admission: RE | Disposition: A | Discharge status: Home / Self Care | Payer: Self-pay | Source: Ambulatory Visit | Attending: Vascular Surgery

## 2020-12-27 ENCOUNTER — Other Ambulatory Visit: Payer: Self-pay

## 2020-12-27 ENCOUNTER — Ambulatory Visit (HOSPITAL_COMMUNITY)
Admission: RE | Admit: 2020-12-27 | Discharge: 2020-12-27 | Disposition: A | Discharge status: Home / Self Care | Payer: Medicaid Other | Source: Ambulatory Visit | Attending: Vascular Surgery | Admitting: Vascular Surgery

## 2020-12-27 ENCOUNTER — Encounter (HOSPITAL_COMMUNITY): Payer: Self-pay | Admitting: Vascular Surgery

## 2020-12-27 DIAGNOSIS — Z992 Dependence on renal dialysis: Secondary | ICD-10-CM | POA: Insufficient documentation

## 2020-12-27 DIAGNOSIS — N186 End stage renal disease: Secondary | ICD-10-CM | POA: Insufficient documentation

## 2020-12-27 DIAGNOSIS — Z880 Allergy status to penicillin: Secondary | ICD-10-CM | POA: Diagnosis not present

## 2020-12-27 DIAGNOSIS — I120 Hypertensive chronic kidney disease with stage 5 chronic kidney disease or end stage renal disease: Secondary | ICD-10-CM | POA: Diagnosis not present

## 2020-12-27 DIAGNOSIS — N185 Chronic kidney disease, stage 5: Secondary | ICD-10-CM | POA: Diagnosis not present

## 2020-12-27 DIAGNOSIS — Z79899 Other long term (current) drug therapy: Secondary | ICD-10-CM | POA: Diagnosis not present

## 2020-12-27 DIAGNOSIS — Z87891 Personal history of nicotine dependence: Secondary | ICD-10-CM | POA: Insufficient documentation

## 2020-12-27 DIAGNOSIS — Z794 Long term (current) use of insulin: Secondary | ICD-10-CM | POA: Insufficient documentation

## 2020-12-27 DIAGNOSIS — Z888 Allergy status to other drugs, medicaments and biological substances status: Secondary | ICD-10-CM | POA: Diagnosis not present

## 2020-12-27 DIAGNOSIS — E1122 Type 2 diabetes mellitus with diabetic chronic kidney disease: Secondary | ICD-10-CM | POA: Insufficient documentation

## 2020-12-27 HISTORY — PX: AV FISTULA PLACEMENT: SHX1204

## 2020-12-27 HISTORY — DX: Dyspnea, unspecified: R06.00

## 2020-12-27 LAB — POCT I-STAT, CHEM 8
BUN: 27 mg/dL — ABNORMAL HIGH (ref 8–23)
Calcium, Ion: 1.13 mmol/L — ABNORMAL LOW (ref 1.15–1.40)
Chloride: 95 mmol/L — ABNORMAL LOW (ref 98–111)
Creatinine, Ser: 3.2 mg/dL — ABNORMAL HIGH (ref 0.44–1.00)
Glucose, Bld: 302 mg/dL — ABNORMAL HIGH (ref 70–99)
HCT: 43 % (ref 36.0–46.0)
Hemoglobin: 14.6 g/dL (ref 12.0–15.0)
Potassium: 3.5 mmol/L (ref 3.5–5.1)
Sodium: 135 mmol/L (ref 135–145)
TCO2: 29 mmol/L (ref 22–32)

## 2020-12-27 LAB — GLUCOSE, CAPILLARY
Glucose-Capillary: 288 mg/dL — ABNORMAL HIGH (ref 70–99)
Glucose-Capillary: 220 mg/dL — ABNORMAL HIGH (ref 70–99)
Glucose-Capillary: 133 mg/dL — ABNORMAL HIGH (ref 70–99)
Glucose-Capillary: 94 mg/dL (ref 70–99)

## 2020-12-27 LAB — SARS CORONAVIRUS 2 (TAT 6-24 HRS): SARS Coronavirus 2: NEGATIVE

## 2020-12-27 SURGERY — INSERTION OF ARTERIOVENOUS (AV) GORE-TEX GRAFT ARM
Anesthesia: Monitor Anesthesia Care | Site: Arm Upper | Laterality: Right

## 2020-12-27 MED ORDER — HYDROCODONE-ACETAMINOPHEN 5-325 MG PO TABS: 1.0000 | ORAL_TABLET | Freq: Four times a day (QID) | ORAL | 0 refills | Status: DC | PRN

## 2020-12-27 MED ORDER — HYDRALAZINE HCL 20 MG/ML IJ SOLN: 50.0000 mg | INTRAMUSCULAR | Status: DC

## 2020-12-27 MED ORDER — INSULIN ASPART 100 UNIT/ML ~~LOC~~ SOLN
SUBCUTANEOUS | Status: AC
  Administered 2020-12-27: 10 [IU] via INTRAVENOUS
  Filled 2020-12-27: qty 1

## 2020-12-27 MED ORDER — LIDOCAINE 2% (20 MG/ML) 5 ML SYRINGE
INTRAMUSCULAR | Status: DC | PRN
  Administered 2020-12-27: 40 mg via INTRAVENOUS

## 2020-12-27 MED ORDER — CHLORHEXIDINE GLUCONATE 4 % EX LIQD: 60.0000 mL | Freq: Once | CUTANEOUS | Status: DC

## 2020-12-27 MED ORDER — CHLORHEXIDINE GLUCONATE 0.12 % MT SOLN
15.0000 mL | Freq: Once | OROMUCOSAL | Status: AC
  Administered 2020-12-27: 15 mL via OROMUCOSAL
  Filled 2020-12-27: qty 15

## 2020-12-27 MED ORDER — 0.9 % SODIUM CHLORIDE (POUR BTL) OPTIME
TOPICAL | Status: DC | PRN
  Administered 2020-12-27: 1000 mL

## 2020-12-27 MED ORDER — VANCOMYCIN HCL IN DEXTROSE 1-5 GM/200ML-% IV SOLN
1000.0000 mg | INTRAVENOUS | Status: AC
  Administered 2020-12-27: 1000 mg via INTRAVENOUS
  Filled 2020-12-27: qty 200

## 2020-12-27 MED ORDER — SODIUM CHLORIDE 0.9 % IV SOLN: INTRAVENOUS | Status: DC

## 2020-12-27 MED ORDER — PROMETHAZINE HCL 25 MG/ML IJ SOLN
6.2500 mg | INTRAMUSCULAR | Status: DC | PRN
Start: 2020-12-27 — End: 2020-12-27

## 2020-12-27 MED ORDER — ORAL CARE MOUTH RINSE: 15.0000 mL | Freq: Once | OROMUCOSAL | Status: AC

## 2020-12-27 MED ORDER — FENTANYL CITRATE (PF) 100 MCG/2ML IJ SOLN: 25.0000 ug | INTRAMUSCULAR | Status: DC | PRN

## 2020-12-27 MED ORDER — ACETAMINOPHEN 10 MG/ML IV SOLN: 1000.0000 mg | Freq: Once | INTRAVENOUS | Status: DC | PRN

## 2020-12-27 MED ORDER — MIDAZOLAM HCL 2 MG/2ML IJ SOLN: 1.0000 mg | Freq: Once | INTRAMUSCULAR | Status: AC

## 2020-12-27 MED ORDER — SODIUM CHLORIDE 0.9 % IV SOLN
INTRAVENOUS | Status: AC
  Filled 2020-12-27: qty 1.2

## 2020-12-27 MED ORDER — HYDRALAZINE HCL 20 MG/ML IJ SOLN
INTRAMUSCULAR | Status: AC
  Administered 2020-12-27: 10 mg via INTRAVENOUS
  Filled 2020-12-27: qty 1

## 2020-12-27 MED ORDER — LIDOCAINE-EPINEPHRINE (PF) 1.5 %-1:200000 IJ SOLN
INTRAMUSCULAR | Status: DC | PRN
  Administered 2020-12-27: 20 mL via PERINEURAL

## 2020-12-27 MED ORDER — INSULIN ASPART 100 UNIT/ML ~~LOC~~ SOLN: 10.0000 [IU] | SUBCUTANEOUS | Status: AC

## 2020-12-27 MED ORDER — MIDAZOLAM HCL 2 MG/2ML IJ SOLN
INTRAMUSCULAR | Status: AC
  Administered 2020-12-27: 1 mg via INTRAVENOUS
  Filled 2020-12-27: qty 2

## 2020-12-27 MED ORDER — FENTANYL CITRATE (PF) 100 MCG/2ML IJ SOLN
INTRAMUSCULAR | Status: AC
  Administered 2020-12-27: 50 ug via INTRAVENOUS
  Filled 2020-12-27: qty 2

## 2020-12-27 MED ORDER — HYDRALAZINE HCL 20 MG/ML IJ SOLN: 10.0000 mg | INTRAMUSCULAR | Status: AC

## 2020-12-27 MED ORDER — PROPOFOL 500 MG/50ML IV EMUL
INTRAVENOUS | Status: DC | PRN
  Administered 2020-12-27: 50 ug/kg/min via INTRAVENOUS

## 2020-12-27 MED ORDER — FENTANYL CITRATE (PF) 100 MCG/2ML IJ SOLN: 50.0000 ug | Freq: Once | INTRAMUSCULAR | Status: AC

## 2020-12-27 MED ORDER — SODIUM CHLORIDE 0.9 % IV SOLN
INTRAVENOUS | Status: DC | PRN
  Administered 2020-12-27: 500 mL

## 2020-12-27 MED ORDER — INSULIN ASPART 100 UNIT/ML ~~LOC~~ SOLN: 10.0000 [IU] | SUBCUTANEOUS | Status: DC

## 2020-12-27 SURGICAL SUPPLY — 32 items
ADH SKN CLS APL DERMABOND .7 (GAUZE/BANDAGES/DRESSINGS) ×1
ARMBAND PINK RESTRICT EXTREMIT (MISCELLANEOUS) ×3 IMPLANT
CANISTER SUCT 3000ML PPV (MISCELLANEOUS) ×2 IMPLANT
CLIP VESOCCLUDE MED 6/CT (CLIP) ×2 IMPLANT
CLIP VESOCCLUDE SM WIDE 6/CT (CLIP) ×2 IMPLANT
COVER PROBE W GEL 5X96 (DRAPES) ×1 IMPLANT
COVER WAND RF STERILE (DRAPES) ×1 IMPLANT
DERMABOND ADVANCED (GAUZE/BANDAGES/DRESSINGS) ×1
DERMABOND ADVANCED .7 DNX12 (GAUZE/BANDAGES/DRESSINGS) ×1 IMPLANT
ELECT REM PT RETURN 9FT ADLT (ELECTROSURGICAL) ×2
ELECTRODE REM PT RTRN 9FT ADLT (ELECTROSURGICAL) ×1 IMPLANT
GLOVE BIO SURGEON STRL SZ7.5 (GLOVE) ×2 IMPLANT
GOWN STRL REUS W/ TWL LRG LVL3 (GOWN DISPOSABLE) ×2 IMPLANT
GOWN STRL REUS W/ TWL XL LVL3 (GOWN DISPOSABLE) ×1 IMPLANT
GOWN STRL REUS W/TWL LRG LVL3 (GOWN DISPOSABLE) ×4
GOWN STRL REUS W/TWL XL LVL3 (GOWN DISPOSABLE) ×2
HEMOSTAT SNOW SURGICEL 2X4 (HEMOSTASIS) IMPLANT
INSERT FOGARTY SM (MISCELLANEOUS) ×2 IMPLANT
KIT BASIN OR (CUSTOM PROCEDURE TRAY) ×2 IMPLANT
KIT TURNOVER KIT B (KITS) ×2 IMPLANT
NS IRRIG 1000ML POUR BTL (IV SOLUTION) ×2 IMPLANT
PACK CV ACCESS (CUSTOM PROCEDURE TRAY) ×2 IMPLANT
PAD ARMBOARD 7.5X6 YLW CONV (MISCELLANEOUS) ×4 IMPLANT
SUT MNCRL AB 4-0 PS2 18 (SUTURE) ×1 IMPLANT
SUT PROLENE 6 0 BV (SUTURE) ×1 IMPLANT
SUT SILK 2 0 SH (SUTURE) IMPLANT
SUT VIC AB 3-0 SH 27 (SUTURE) ×4
SUT VIC AB 3-0 SH 27X BRD (SUTURE) ×2 IMPLANT
SYR TOOMEY 50ML (SYRINGE) IMPLANT
TOWEL GREEN STERILE (TOWEL DISPOSABLE) ×2 IMPLANT
UNDERPAD 30X36 HEAVY ABSORB (UNDERPADS AND DIAPERS) ×2 IMPLANT
WATER STERILE IRR 1000ML POUR (IV SOLUTION) ×2 IMPLANT

## 2020-12-27 NOTE — Anesthesia Preprocedure Evaluation (Addendum)
Anesthesia Evaluation  Patient identified by MRN, date of birth, ID band Patient awake    Reviewed: Allergy & Precautions, NPO status , Patient's Chart, lab work & pertinent test results  Airway Mallampati: II   Neck ROM: Limited   Comment: Scar tissue bilateral neck with limited mobility  Dental  (+) Edentulous Upper, Edentulous Lower   Pulmonary neg pulmonary ROS, former smoker,    Pulmonary exam normal        Cardiovascular hypertension, Pt. on medications  Rhythm:Regular Rate:Normal     Neuro/Psych negative neurological ROS  negative psych ROS   GI/Hepatic Neg liver ROS, GERD  Medicated,  Endo/Other  diabetes (FS 288), Poorly Controlled, Type 2, Insulin DependentHyperthyroidism   Renal/GU CRF and DialysisRenal disease  negative genitourinary   Musculoskeletal negative musculoskeletal ROS (+)   Abdominal (+)  Abdomen: soft. Bowel sounds: normal.  Peds  Hematology negative hematology ROS (+) anemia ,   Anesthesia Other Findings H/o oropharyngeal Ca with larynx involvement. Fiberoptic bronchoscopic exam of VC ~6 months ago with prior procedure unremarkable.   Reproductive/Obstetrics                           Anesthesia Physical Anesthesia Plan  ASA: III  Anesthesia Plan: MAC and Regional   Post-op Pain Management:  Regional for Post-op pain   Induction: Intravenous  PONV Risk Score and Plan: 2 and Ondansetron, Propofol infusion, Treatment may vary due to age or medical condition and Midazolam  Airway Management Planned: Simple Face Mask, Natural Airway and Nasal Cannula  Additional Equipment: None  Intra-op Plan:   Post-operative Plan:   Informed Consent: I have reviewed the patients History and Physical, chart, labs and discussed the procedure including the risks, benefits and alternatives for the proposed anesthesia with the patient or authorized representative who has  indicated his/her understanding and acceptance.     Dental advisory given  Plan Discussed with: CRNA  Anesthesia Plan Comments: (Lab Results      Component                Value               Date                      NA                       135                 12/27/2020                K                        3.5                 12/27/2020                CO2                      23                  02/29/2020                GLUCOSE                  302 (H)  12/27/2020                BUN                      27 (H)              12/27/2020                CREATININE               3.20 (H)            12/27/2020                CALCIUM                  8.3 (L)             02/29/2020                GFRNONAA                 13 (L)              02/29/2020                GFRAA                    15 (L)              02/29/2020           )        Anesthesia Quick Evaluation

## 2020-12-27 NOTE — Transfer of Care (Signed)
Immediate Anesthesia Transfer of Care Note  Patient: Susan Evans  Procedure(s) Performed: RIGHT ARM CEPHALIC ARTERIOVENOUS FISTULA CREATION (Right Arm Upper)  Patient Location: PACU  Anesthesia Type:MAC and Regional  Level of Consciousness: awake  Airway & Oxygen Therapy: Patient Spontanous Breathing and Patient connected to face mask oxygen  Post-op Assessment: Report given to RN and Post -op Vital signs reviewed and stable  Post vital signs: Reviewed and stable  Last Vitals:  Vitals Value Taken Time  BP 153/66 12/27/20 1359  Temp    Pulse    Resp 19 12/27/20 1401  SpO2    Vitals shown include unvalidated device data.  Last Pain:  Vitals:   12/27/20 1150  TempSrc:   PainSc: 0-No pain      Patients Stated Pain Goal: 5 (38/32/91 9166)  Complications: No complications documented.

## 2020-12-27 NOTE — Op Note (Signed)
    Patient name: Susan Evans MRN: [id number] DOB: 1956-01-09 Sex: female  12/27/2020 Pre-operative Diagnosis: End-stage renal disease Post-operative diagnosis:  Same Surgeon:  Erlene Quan C. Donzetta Matters, MD Assistant: Ethelene Hal, MS 3 Procedure Performed: Right arm brachial artery to cephalic vein AV fistula creation  Indications: 65 year old female with end-stage renal disease previously had a left arm AV graft that thrombosed.  She now has dialysis via tunnel catheter.  She is indicated for right upper extremity access.  Findings: The cephalic vein in the antecubital measured at least 3 mm diameter.  There is minimal scarring there.  This was easily dilated 3-1/2 mm.  The brachial artery also was free of disease approximately 3 mm.  At completion there was a strong pulsatility in the fistula but had good flow by Doppler there was a palpable radial artery pulse the wrist.   Procedure:  The patient was identified in the holding area and taken to the operating where she is placed supine on operative table.  Preoperative block of been placed MAC anesthesia was induced.  She was sterilely prepped draped in the right upper extremity usual fashion, antibiotics were ministered timeout was called.  Ultrasound was used to identified a small basilic vein with what appeared to be a suitable cephalic vein at the level of the antecubitum.  The brachial artery was also suitable at that size and free of disease by ultrasound.  The block was checked noted to be suitable.  Transverse incision was made to be dissected out the vein for several centimeters marked if orientation.  The deep fascia was divided we dissected the brachial artery placed Vesseloops around this.  The vein was then transected distally and tied off.  We serially dilated up to 3-1/2 mm flushed lip lysing and clamped the vein.  The arteries clamped distally proximally opened longitudinally flushed with apply saline distally only.  We then spatulated the  vein sewn end-to-side with 6-0 Prolene suture.  Prior completion of flushing or extubated by completion there was good flow through the vein although somewhat pulsatile.  Doppler demonstrated flow throughout diastole there was a palpable radial artery pulse at the wrist also confirmed with Doppler.  Satisfied we irrigated the wound obtaining the stasis closed in layers with Vicryl and Monocryl.  Dermabond was placed at the skin level.  She was awakened from anesthesia having tolerated procedure without any complication.  Counts were correct at completion.  EBL: 20 cc    Remee Charley C. Donzetta Matters, MD [facility] [place] [facility] Office: [phone] Pager: [phone]

## 2020-12-27 NOTE — Interval H&P Note (Signed)
History and Physical Interval Note:  12/27/2020 10:27 AM  Susan Evans  has presented today for surgery, with the diagnosis of ESRD.  The various methods of treatment have been discussed with the patient and family. After consideration of risks, benefits and other options for treatment, the patient has consented to  Procedure(s): INSERTION OF ARTERIOVENOUS (AV) GORE-TEX GRAFT RIGHT ARM (Right) as a surgical intervention.  The patient's history has been reviewed, patient examined, no change in status, stable for surgery.  I have reviewed the patient's chart and labs.  Questions were answered to the patient's satisfaction.     Servando Snare

## 2020-12-27 NOTE — Discharge Instructions (Signed)
° °  [facility]  ° °Discharge Instructions ° °AV Fistula or Graft Surgery for Dialysis Access ° °Please refer to the following instructions for your post-procedure care. Your surgeon or physician assistant will discuss any changes with you. ° °Activity ° °You may drive the day following your surgery, if you are comfortable and no longer taking prescription pain medication. Resume full activity as the soreness in your incision resolves. ° °Bathing/Showering ° °You may shower after you go home. Keep your incision dry for 48 hours. Do not soak in a bathtub, hot tub, or swim until the incision heals completely. You may not shower if you have a hemodialysis catheter. ° °Incision Care ° °Clean your incision with mild soap and water after 48 hours. Pat the area dry with a clean towel. You do not need a bandage unless otherwise instructed. Do not apply any ointments or creams to your incision. You may have skin glue on your incision. Do not peel it off. It will come off on its own in about one week. Your arm may swell a bit after surgery. To reduce swelling use pillows to elevate your arm so it is above your heart. Your doctor will tell you if you need to lightly wrap your arm with an ACE bandage. ° °Diet ° °Resume your normal diet. There are not special food restrictions following this procedure. In order to heal from your surgery, it is CRITICAL to get adequate nutrition. Your body requires vitamins, minerals, and protein. Vegetables are the best source of vitamins and minerals. Vegetables also provide the perfect balance of protein. Processed food has little nutritional value, so try to avoid this. ° °Medications ° °Resume taking all of your medications. If your incision is causing pain, you may take over-the counter pain relievers such as acetaminophen (Tylenol). If you were prescribed a stronger pain medication, please be aware these medications can cause nausea and constipation. Prevent  nausea by taking the medication with a snack or meal. Avoid constipation by drinking plenty of fluids and eating foods with high amount of fiber, such as fruits, vegetables, and grains. Do not take Tylenol if you are taking prescription pain medications. ° ° ° ° °Follow up °Your surgeon may want to see you in the office following your access surgery. If so, this will be arranged at the time of your surgery. ° °Please call us immediately for any of the following conditions: ° °Increased pain, redness, drainage (pus) from your incision site °Fever of 101 degrees or higher °Severe or worsening pain at your incision site °Hand pain or numbness. ° °Reduce your risk of vascular disease: ° °Stop smoking. If you would like help, call [facility] at 1-800-QUIT-NOW (1-800-784-8669) or [place] at 336-586-4000 ° °Manage your cholesterol °Maintain a desired weight °Control your diabetes °Keep your blood pressure down ° °Dialysis ° °It will take several weeks to several months for your new dialysis access to be ready for use. Your surgeon will determine when it is OK to use it. Your nephrologist will continue to direct your dialysis. You can continue to use your Permcath until your new access is ready for use. ° °If you have any questions, please call the office at [phone]. ° °

## 2020-12-27 NOTE — Anesthesia Procedure Notes (Signed)
Anesthesia Regional Block: Supraclavicular block   Pre-Anesthetic Checklist: ,, timeout performed, Correct Patient, Correct Site, Correct Laterality, Correct Procedure, Correct Position, site marked, Risks and benefits discussed,  Surgical consent,  Pre-op evaluation,  At surgeon's request and post-op pain management  Laterality: Right  Prep: Dura Prep       Needles:  Injection technique: Single-shot  Needle Type: Echogenic Stimulator Needle     Needle Length: 5cm  Needle Gauge: 20     Additional Needles:   Procedures:,,,, ultrasound used (permanent image in chart),,,,  Narrative:  Start time: 12/27/2020 11:38 AM End time: 12/27/2020 11:43 AM Injection made incrementally with aspirations every 5 mL.  Performed by: Personally  Anesthesiologist: Darral Dash, DO  Additional Notes: Patient identified. Risks/Benefits/Options discussed with patient including but not limited to bleeding, infection, nerve damage, failed block, incomplete pain control. Patient expressed understanding and wished to proceed. All questions were answered. Sterile technique was used throughout the entire procedure. Please see nursing notes for vital signs. Aspirated in 5cc intervals with injection for negative confirmation. Patient was given instructions on fall risk and not to get out of bed. All questions and concerns addressed with instructions to call with any issues or inadequate analgesia.

## 2020-12-27 NOTE — Progress Notes (Signed)
Pt's BP was 786V Systolic on arrival. CBG 672, 302 on I-STAT. Per patient, she takes 50 mg PO Hydralazine BID, last dose last night, none this AM. Per pt she also took 3 units Novolog and 3 units Lantus at 0830. Dr. Rex Kras notified. Verbal order received for 10 units Novolog IV Insulin, and 10 mg Hydralazine IV. CBG  Recheck was 220, BP now 176/63.

## 2020-12-27 NOTE — Progress Notes (Signed)
Orthopedic Tech Progress Note Patient Details:  Susan Evans [birth year] [id number]  Ortho Devices Type of Ortho Device: Arm sling Ortho Device/Splint Location: RUE Ortho Device/Splint Interventions: Adjustment,Application,Ordered   Post Interventions Patient Tolerated: Well   [name] A Son Barkan 12/27/2020, 2:50 PM

## 2020-12-28 ENCOUNTER — Encounter (HOSPITAL_COMMUNITY): Payer: Self-pay | Admitting: Vascular Surgery

## 2020-12-28 DIAGNOSIS — N186 End stage renal disease: Secondary | ICD-10-CM | POA: Diagnosis not present

## 2020-12-28 DIAGNOSIS — N2581 Secondary hyperparathyroidism of renal origin: Secondary | ICD-10-CM | POA: Diagnosis not present

## 2020-12-28 DIAGNOSIS — T8249XD Other complication of vascular dialysis catheter, subsequent encounter: Secondary | ICD-10-CM | POA: Diagnosis not present

## 2020-12-28 DIAGNOSIS — Z992 Dependence on renal dialysis: Secondary | ICD-10-CM | POA: Diagnosis not present

## 2020-12-28 DIAGNOSIS — D688 Other specified coagulation defects: Secondary | ICD-10-CM | POA: Diagnosis not present

## 2020-12-28 DIAGNOSIS — D689 Coagulation defect, unspecified: Secondary | ICD-10-CM | POA: Diagnosis not present

## 2020-12-28 DIAGNOSIS — E1129 Type 2 diabetes mellitus with other diabetic kidney complication: Secondary | ICD-10-CM | POA: Diagnosis not present

## 2020-12-29 ENCOUNTER — Encounter (HOSPITAL_COMMUNITY): Payer: Self-pay | Admitting: Vascular Surgery

## 2020-12-29 NOTE — Anesthesia Postprocedure Evaluation (Signed)
Anesthesia Post Note  Patient: Susan Evans  Procedure(s) Performed: RIGHT ARM CEPHALIC ARTERIOVENOUS FISTULA CREATION (Right Arm Upper)     Patient location during evaluation: PACU Anesthesia Type: Regional and MAC Level of consciousness: awake and alert Pain management: pain level controlled Vital Signs Assessment: post-procedure vital signs reviewed and stable Respiratory status: spontaneous breathing, nonlabored ventilation, respiratory function stable and patient connected to nasal cannula oxygen Cardiovascular status: stable and blood pressure returned to baseline Postop Assessment: no apparent nausea or vomiting Anesthetic complications: no   No complications documented.  Last Vitals:  Vitals:   12/27/20 1422 12/27/20 1430  BP: 119/82 (!) 156/65  Pulse:    Resp: 19 20  Temp:    SpO2: 100% 100%    Last Pain:  Vitals:   12/27/20 1430  TempSrc:   PainSc: 0-No pain                 Belenda Cruise P Casten Floren

## 2020-12-30 DIAGNOSIS — D689 Coagulation defect, unspecified: Secondary | ICD-10-CM | POA: Diagnosis not present

## 2020-12-30 DIAGNOSIS — N2581 Secondary hyperparathyroidism of renal origin: Secondary | ICD-10-CM | POA: Diagnosis not present

## 2020-12-30 DIAGNOSIS — E1129 Type 2 diabetes mellitus with other diabetic kidney complication: Secondary | ICD-10-CM | POA: Diagnosis not present

## 2020-12-30 DIAGNOSIS — T8249XD Other complication of vascular dialysis catheter, subsequent encounter: Secondary | ICD-10-CM | POA: Diagnosis not present

## 2020-12-30 DIAGNOSIS — Z992 Dependence on renal dialysis: Secondary | ICD-10-CM | POA: Diagnosis not present

## 2020-12-30 DIAGNOSIS — N186 End stage renal disease: Secondary | ICD-10-CM | POA: Diagnosis not present

## 2020-12-30 DIAGNOSIS — D688 Other specified coagulation defects: Secondary | ICD-10-CM | POA: Diagnosis not present

## 2021-01-02 DIAGNOSIS — E876 Hypokalemia: Secondary | ICD-10-CM | POA: Diagnosis not present

## 2021-01-02 DIAGNOSIS — Z992 Dependence on renal dialysis: Secondary | ICD-10-CM | POA: Diagnosis not present

## 2021-01-02 DIAGNOSIS — D689 Coagulation defect, unspecified: Secondary | ICD-10-CM | POA: Diagnosis not present

## 2021-01-02 DIAGNOSIS — D688 Other specified coagulation defects: Secondary | ICD-10-CM | POA: Diagnosis not present

## 2021-01-02 DIAGNOSIS — N2581 Secondary hyperparathyroidism of renal origin: Secondary | ICD-10-CM | POA: Diagnosis not present

## 2021-01-02 DIAGNOSIS — T8249XD Other complication of vascular dialysis catheter, subsequent encounter: Secondary | ICD-10-CM | POA: Diagnosis not present

## 2021-01-02 DIAGNOSIS — N186 End stage renal disease: Secondary | ICD-10-CM | POA: Diagnosis not present

## 2021-01-04 ENCOUNTER — Encounter: Payer: Medicaid Other | Admitting: Student

## 2021-01-04 DIAGNOSIS — D689 Coagulation defect, unspecified: Secondary | ICD-10-CM | POA: Diagnosis not present

## 2021-01-04 DIAGNOSIS — D688 Other specified coagulation defects: Secondary | ICD-10-CM | POA: Diagnosis not present

## 2021-01-04 DIAGNOSIS — T8249XD Other complication of vascular dialysis catheter, subsequent encounter: Secondary | ICD-10-CM | POA: Diagnosis not present

## 2021-01-04 DIAGNOSIS — N186 End stage renal disease: Secondary | ICD-10-CM | POA: Diagnosis not present

## 2021-01-04 DIAGNOSIS — N2581 Secondary hyperparathyroidism of renal origin: Secondary | ICD-10-CM | POA: Diagnosis not present

## 2021-01-04 DIAGNOSIS — E876 Hypokalemia: Secondary | ICD-10-CM | POA: Diagnosis not present

## 2021-01-04 DIAGNOSIS — Z992 Dependence on renal dialysis: Secondary | ICD-10-CM | POA: Diagnosis not present

## 2021-01-06 DIAGNOSIS — T8249XD Other complication of vascular dialysis catheter, subsequent encounter: Secondary | ICD-10-CM | POA: Diagnosis not present

## 2021-01-06 DIAGNOSIS — D688 Other specified coagulation defects: Secondary | ICD-10-CM | POA: Diagnosis not present

## 2021-01-06 DIAGNOSIS — N186 End stage renal disease: Secondary | ICD-10-CM | POA: Diagnosis not present

## 2021-01-06 DIAGNOSIS — D689 Coagulation defect, unspecified: Secondary | ICD-10-CM | POA: Diagnosis not present

## 2021-01-06 DIAGNOSIS — Z992 Dependence on renal dialysis: Secondary | ICD-10-CM | POA: Diagnosis not present

## 2021-01-06 DIAGNOSIS — E876 Hypokalemia: Secondary | ICD-10-CM | POA: Diagnosis not present

## 2021-01-06 DIAGNOSIS — N2581 Secondary hyperparathyroidism of renal origin: Secondary | ICD-10-CM | POA: Diagnosis not present

## 2021-01-09 DIAGNOSIS — N2581 Secondary hyperparathyroidism of renal origin: Secondary | ICD-10-CM | POA: Diagnosis not present

## 2021-01-09 DIAGNOSIS — Z992 Dependence on renal dialysis: Secondary | ICD-10-CM | POA: Diagnosis not present

## 2021-01-09 DIAGNOSIS — E876 Hypokalemia: Secondary | ICD-10-CM | POA: Diagnosis not present

## 2021-01-09 DIAGNOSIS — D688 Other specified coagulation defects: Secondary | ICD-10-CM | POA: Diagnosis not present

## 2021-01-09 DIAGNOSIS — D689 Coagulation defect, unspecified: Secondary | ICD-10-CM | POA: Diagnosis not present

## 2021-01-09 DIAGNOSIS — T8249XD Other complication of vascular dialysis catheter, subsequent encounter: Secondary | ICD-10-CM | POA: Diagnosis not present

## 2021-01-09 DIAGNOSIS — N186 End stage renal disease: Secondary | ICD-10-CM | POA: Diagnosis not present

## 2021-01-10 ENCOUNTER — Ambulatory Visit: Payer: Medicaid Other | Admitting: Student

## 2021-01-10 VITALS — BP 139/74 | HR 94 | Temp 98.3°F | Ht 62.0 in | Wt 88.0 lb

## 2021-01-10 DIAGNOSIS — R1013 Epigastric pain: Secondary | ICD-10-CM

## 2021-01-10 DIAGNOSIS — R636 Underweight: Secondary | ICD-10-CM | POA: Diagnosis not present

## 2021-01-10 DIAGNOSIS — E785 Hyperlipidemia, unspecified: Secondary | ICD-10-CM

## 2021-01-10 DIAGNOSIS — E05 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: Secondary | ICD-10-CM | POA: Diagnosis not present

## 2021-01-10 DIAGNOSIS — E118 Type 2 diabetes mellitus with unspecified complications: Secondary | ICD-10-CM

## 2021-01-10 DIAGNOSIS — I129 Hypertensive chronic kidney disease with stage 1 through stage 4 chronic kidney disease, or unspecified chronic kidney disease: Secondary | ICD-10-CM | POA: Diagnosis not present

## 2021-01-10 DIAGNOSIS — N186 End stage renal disease: Secondary | ICD-10-CM

## 2021-01-10 DIAGNOSIS — I10 Essential (primary) hypertension: Secondary | ICD-10-CM

## 2021-01-10 LAB — POCT GLYCOSYLATED HEMOGLOBIN (HGB A1C): Hemoglobin A1C: 10.2 % — AB (ref 4.0–5.6)

## 2021-01-10 LAB — GLUCOSE, CAPILLARY: Glucose-Capillary: 298 mg/dL — ABNORMAL HIGH (ref 70–99)

## 2021-01-10 MED ORDER — HYDRALAZINE HCL 50 MG PO TABS: 50.0000 mg | ORAL_TABLET | Freq: Two times a day (BID) | ORAL | 3 refills | Status: DC

## 2021-01-10 MED ORDER — ATORVASTATIN CALCIUM 40 MG PO TABS: 40.0000 mg | ORAL_TABLET | Freq: Every day | ORAL | 3 refills | Status: DC

## 2021-01-10 MED ORDER — VERAPAMIL HCL ER 180 MG PO TBCR: 360.0000 mg | EXTENDED_RELEASE_TABLET | Freq: Every day | ORAL | 3 refills | Status: DC

## 2021-01-10 MED ORDER — OMEPRAZOLE 20 MG PO CPDR: 20.0000 mg | DELAYED_RELEASE_CAPSULE | Freq: Every day | ORAL | 2 refills | Status: DC

## 2021-01-10 NOTE — Patient Instructions (Signed)
It was a pleasure seeing you in clinic. Today we discussed:   I have refilled you medications.  Please start using lantus 30 units daily rather than lantus 15 units twice a day  I am also checking you TSH and will call you with results  In the meantime please make sure you are eating and drinking well.  Please follow up in 3 months  If you have any questions or concerns, please call our clinic at [phone] between 9am-5pm and after hours call [phone] and ask for the internal medicine resident on call. If you feel you are having a medical emergency please call 911.   Thank you, we look forward to helping you remain healthy!

## 2021-01-11 DIAGNOSIS — N2581 Secondary hyperparathyroidism of renal origin: Secondary | ICD-10-CM | POA: Diagnosis not present

## 2021-01-11 DIAGNOSIS — E876 Hypokalemia: Secondary | ICD-10-CM | POA: Diagnosis not present

## 2021-01-11 DIAGNOSIS — D688 Other specified coagulation defects: Secondary | ICD-10-CM | POA: Diagnosis not present

## 2021-01-11 DIAGNOSIS — N186 End stage renal disease: Secondary | ICD-10-CM | POA: Diagnosis not present

## 2021-01-11 DIAGNOSIS — Z992 Dependence on renal dialysis: Secondary | ICD-10-CM | POA: Diagnosis not present

## 2021-01-11 DIAGNOSIS — D689 Coagulation defect, unspecified: Secondary | ICD-10-CM | POA: Diagnosis not present

## 2021-01-11 DIAGNOSIS — T8249XD Other complication of vascular dialysis catheter, subsequent encounter: Secondary | ICD-10-CM | POA: Diagnosis not present

## 2021-01-11 DIAGNOSIS — R636 Underweight: Secondary | ICD-10-CM | POA: Insufficient documentation

## 2021-01-11 LAB — TSH: TSH: 3.25 u[IU]/mL (ref 0.450–4.500)

## 2021-01-11 NOTE — Assessment & Plan Note (Signed)
Patient is asymptomatic, but noted to have weight loss, and HR in 90s on diltiazem. Will recheck TSH today    TSH wnl today

## 2021-01-11 NOTE — Assessment & Plan Note (Addendum)
Notable increase in A1c to 10.2% today from last visit in September. Notes she has been drinking at least 2 bottles of coke a day since starting dialysis in August. Currently on Lantus 15 units twice daily and novolog three time daily. Reports morning glucose aroung 130-140. Discussed that her A1c if elevated and suggested she stopping drinking soda. Patient understand and is agreeable.  Plan - Lantus 30 units once daily to simply regimen - continue Novolog 5 units - follow up in 1 month, patient will bring her meter to next visit

## 2021-01-11 NOTE — Progress Notes (Signed)
   CC: Diabetes follow up  HPI:  [name] is a 65 y.o. female with history listed below presents for diabetes follow up.   Past Medical History:  Diagnosis Date  . AKI (acute kidney injury) ([place]) 08/12/2019  . Chronic kidney disease    Dialysis T/Th/Sat- [facility]  . 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 Diabetes mellitus    type 2   . Dyspnea    "when I have too much fluid."  . Ganglion cyst    right wrist  . GERD (gastroesophageal reflux disease)   . History of radiation therapy 02/27/10 -04/13/10   left base of tongue, bilat neck  . Hyperlipidemia   . Hypertension   . Iron deficiency anemia   . 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 Menopause   . Thyroid disease    Hyperthyroidism  . Tobacco abuse    PT HAS QUIT   Review of Systems:  Negative as per HPI  Physical Exam:  Vitals:   01/10/21 1023  BP: 139/74  Pulse: 94  Temp: 98.3 F (36.8 C)  TempSrc: Oral  SpO2: 100%  Weight: 88 lb (39.9 kg)  Height: 5\' 2"  (1.575 m)   Constitutional: Frail, chronically ill appearing  HENT: Normocephalic and atraumatic, EOMI, conjunctiva normal, moist mucous membranes Cardiovascular: Normal rate, regular rhythm, no murmurs, rubs, gallops.  Distal pulses intact Respiratory: No respiratory distress, no accessory muscle use.  Effort is normal.  Lungs are clear to auscultation bilaterally. GI: Nondistended, soft, nontender to palpation, normal active bowel sounds Musculoskeletal: Normal bulk and tone.  No peripheral edema noted. AV fistula of left arm positive trill and bruit Neurological: Is alert and oriented x4, no apparent focal deficits noted. Skin: Warm and dry.  No rash, erythema, lesions noted. Psychiatric: Normal mood and affect. Behavior is normal. Judgment and thought content normal.    Assessment & Plan:   See Encounters Tab for problem based  charting.  Patient discussed with Dr. Jimmye Norman

## 2021-01-11 NOTE — Assessment & Plan Note (Addendum)
BMI in office is 16.1 with about 5 pound weight lost since last visit. Appears she has been steadily losing weight since 2020. Patient states she has been eating well, has a good appetite, and denies any nausea, vomiting, dysphagia, or odynphagia. States she does all of her own cooking and groceries. Lives with her boyfriend. Drink Nupro with dialysis session. Encouraged her to eat a good diet with plenty of protein. Weight loss multifactorial due to poor po intake, chronic illness, dialysis. Will check her TSH given history of graves disease.

## 2021-01-11 NOTE — Assessment & Plan Note (Signed)
Continues on TTS dialysis. States it is going "alright". Had a new AV fistula placed on right arm due to prior left arm site thrombosis. Appears to be healing well with thrill and bruit.

## 2021-01-11 NOTE — Assessment & Plan Note (Signed)
BP 139/74 in office today. Reports similar reading in 120-130s at home. Will continue on verapamil 360 mg daily, hydralazine 50 twice daily, and clonidine 0.2 daily

## 2021-01-13 DIAGNOSIS — N186 End stage renal disease: Secondary | ICD-10-CM | POA: Diagnosis not present

## 2021-01-13 DIAGNOSIS — N2581 Secondary hyperparathyroidism of renal origin: Secondary | ICD-10-CM | POA: Diagnosis not present

## 2021-01-13 DIAGNOSIS — Z992 Dependence on renal dialysis: Secondary | ICD-10-CM | POA: Diagnosis not present

## 2021-01-13 DIAGNOSIS — D689 Coagulation defect, unspecified: Secondary | ICD-10-CM | POA: Diagnosis not present

## 2021-01-13 DIAGNOSIS — E876 Hypokalemia: Secondary | ICD-10-CM | POA: Diagnosis not present

## 2021-01-13 DIAGNOSIS — D688 Other specified coagulation defects: Secondary | ICD-10-CM | POA: Diagnosis not present

## 2021-01-13 DIAGNOSIS — T8249XD Other complication of vascular dialysis catheter, subsequent encounter: Secondary | ICD-10-CM | POA: Diagnosis not present

## 2021-01-15 ENCOUNTER — Telehealth: Payer: Self-pay | Admitting: Student

## 2021-01-15 NOTE — Progress Notes (Signed)
Internal Medicine Clinic Attending  Case discussed with Dr. Lisabeth Devoid  At the time of the visit.  We reviewed the resident's history and exam and pertinent patient test results.  I agree with the assessment, diagnosis, and plan of care documented in the resident's note.  Susan Evans's health is rapidly declining as manifested by her ongoing unintended weight loss despite reported adequate appetite and intake, now very underweight, together with abrupt worsening of her DM2 from tight control now to A1c 10 despite reported adherence to regimen.  Review of chart reveals unsuccessful attempt to contact her by phone to establish high risk Medicaid Case Management.  At this rate, Susan Evans life expectancy is quite limited unless weight loss trajectory is changed.  Goals of care, further discussions, case management all needed.  Recommend change f/u from 49M to 41M.

## 2021-01-15 NOTE — Telephone Encounter (Signed)
Telephone call to patient today to remind patient to make a follow up appointment to follow up on diabetes and weight loss. Patient states she has not had a chance to schedule an appointment. States she will call to make an appointment today. She has OV on 02/16/2021 with Dr. Sherry Ruffing.

## 2021-01-16 DIAGNOSIS — N2581 Secondary hyperparathyroidism of renal origin: Secondary | ICD-10-CM | POA: Diagnosis not present

## 2021-01-16 DIAGNOSIS — N186 End stage renal disease: Secondary | ICD-10-CM | POA: Diagnosis not present

## 2021-01-16 DIAGNOSIS — T8249XD Other complication of vascular dialysis catheter, subsequent encounter: Secondary | ICD-10-CM | POA: Diagnosis not present

## 2021-01-16 DIAGNOSIS — D688 Other specified coagulation defects: Secondary | ICD-10-CM | POA: Diagnosis not present

## 2021-01-16 DIAGNOSIS — E876 Hypokalemia: Secondary | ICD-10-CM | POA: Diagnosis not present

## 2021-01-16 DIAGNOSIS — D689 Coagulation defect, unspecified: Secondary | ICD-10-CM | POA: Diagnosis not present

## 2021-01-16 DIAGNOSIS — Z992 Dependence on renal dialysis: Secondary | ICD-10-CM | POA: Diagnosis not present

## 2021-01-18 DIAGNOSIS — N2581 Secondary hyperparathyroidism of renal origin: Secondary | ICD-10-CM | POA: Diagnosis not present

## 2021-01-18 DIAGNOSIS — T8249XD Other complication of vascular dialysis catheter, subsequent encounter: Secondary | ICD-10-CM | POA: Diagnosis not present

## 2021-01-18 DIAGNOSIS — D689 Coagulation defect, unspecified: Secondary | ICD-10-CM | POA: Diagnosis not present

## 2021-01-18 DIAGNOSIS — Z992 Dependence on renal dialysis: Secondary | ICD-10-CM | POA: Diagnosis not present

## 2021-01-18 DIAGNOSIS — D688 Other specified coagulation defects: Secondary | ICD-10-CM | POA: Diagnosis not present

## 2021-01-18 DIAGNOSIS — N186 End stage renal disease: Secondary | ICD-10-CM | POA: Diagnosis not present

## 2021-01-18 DIAGNOSIS — E876 Hypokalemia: Secondary | ICD-10-CM | POA: Diagnosis not present

## 2021-01-20 DIAGNOSIS — T8249XD Other complication of vascular dialysis catheter, subsequent encounter: Secondary | ICD-10-CM | POA: Diagnosis not present

## 2021-01-20 DIAGNOSIS — Z992 Dependence on renal dialysis: Secondary | ICD-10-CM | POA: Diagnosis not present

## 2021-01-20 DIAGNOSIS — I129 Hypertensive chronic kidney disease with stage 1 through stage 4 chronic kidney disease, or unspecified chronic kidney disease: Secondary | ICD-10-CM | POA: Diagnosis not present

## 2021-01-20 DIAGNOSIS — N186 End stage renal disease: Secondary | ICD-10-CM | POA: Diagnosis not present

## 2021-01-20 DIAGNOSIS — N2581 Secondary hyperparathyroidism of renal origin: Secondary | ICD-10-CM | POA: Diagnosis not present

## 2021-01-20 DIAGNOSIS — D688 Other specified coagulation defects: Secondary | ICD-10-CM | POA: Diagnosis not present

## 2021-01-20 DIAGNOSIS — D689 Coagulation defect, unspecified: Secondary | ICD-10-CM | POA: Diagnosis not present

## 2021-01-20 DIAGNOSIS — E876 Hypokalemia: Secondary | ICD-10-CM | POA: Diagnosis not present

## 2021-01-23 ENCOUNTER — Other Ambulatory Visit: Payer: Self-pay

## 2021-01-23 ENCOUNTER — Other Ambulatory Visit: Payer: Self-pay | Admitting: Student

## 2021-01-23 DIAGNOSIS — N2581 Secondary hyperparathyroidism of renal origin: Secondary | ICD-10-CM | POA: Diagnosis not present

## 2021-01-23 DIAGNOSIS — N186 End stage renal disease: Secondary | ICD-10-CM

## 2021-01-23 DIAGNOSIS — Z992 Dependence on renal dialysis: Secondary | ICD-10-CM | POA: Diagnosis not present

## 2021-01-23 DIAGNOSIS — D689 Coagulation defect, unspecified: Secondary | ICD-10-CM | POA: Diagnosis not present

## 2021-01-23 DIAGNOSIS — D688 Other specified coagulation defects: Secondary | ICD-10-CM | POA: Diagnosis not present

## 2021-01-23 DIAGNOSIS — T8249XD Other complication of vascular dialysis catheter, subsequent encounter: Secondary | ICD-10-CM | POA: Diagnosis not present

## 2021-01-23 DIAGNOSIS — I10 Essential (primary) hypertension: Secondary | ICD-10-CM

## 2021-01-23 DIAGNOSIS — E1129 Type 2 diabetes mellitus with other diabetic kidney complication: Secondary | ICD-10-CM | POA: Diagnosis not present

## 2021-01-25 DIAGNOSIS — E1129 Type 2 diabetes mellitus with other diabetic kidney complication: Secondary | ICD-10-CM | POA: Diagnosis not present

## 2021-01-25 DIAGNOSIS — T8249XD Other complication of vascular dialysis catheter, subsequent encounter: Secondary | ICD-10-CM | POA: Diagnosis not present

## 2021-01-25 DIAGNOSIS — N186 End stage renal disease: Secondary | ICD-10-CM | POA: Diagnosis not present

## 2021-01-25 DIAGNOSIS — D688 Other specified coagulation defects: Secondary | ICD-10-CM | POA: Diagnosis not present

## 2021-01-25 DIAGNOSIS — Z992 Dependence on renal dialysis: Secondary | ICD-10-CM | POA: Diagnosis not present

## 2021-01-25 DIAGNOSIS — N2581 Secondary hyperparathyroidism of renal origin: Secondary | ICD-10-CM | POA: Diagnosis not present

## 2021-01-25 DIAGNOSIS — D689 Coagulation defect, unspecified: Secondary | ICD-10-CM | POA: Diagnosis not present

## 2021-01-27 DIAGNOSIS — N186 End stage renal disease: Secondary | ICD-10-CM | POA: Diagnosis not present

## 2021-01-27 DIAGNOSIS — N2581 Secondary hyperparathyroidism of renal origin: Secondary | ICD-10-CM | POA: Diagnosis not present

## 2021-01-27 DIAGNOSIS — Z992 Dependence on renal dialysis: Secondary | ICD-10-CM | POA: Diagnosis not present

## 2021-01-27 DIAGNOSIS — T8249XD Other complication of vascular dialysis catheter, subsequent encounter: Secondary | ICD-10-CM | POA: Diagnosis not present

## 2021-01-27 DIAGNOSIS — D688 Other specified coagulation defects: Secondary | ICD-10-CM | POA: Diagnosis not present

## 2021-01-27 DIAGNOSIS — D689 Coagulation defect, unspecified: Secondary | ICD-10-CM | POA: Diagnosis not present

## 2021-01-27 DIAGNOSIS — E1129 Type 2 diabetes mellitus with other diabetic kidney complication: Secondary | ICD-10-CM | POA: Diagnosis not present

## 2021-02-02 ENCOUNTER — Ambulatory Visit (HOSPITAL_COMMUNITY): Payer: Medicaid Other

## 2021-02-13 ENCOUNTER — Other Ambulatory Visit: Payer: Self-pay

## 2021-02-13 ENCOUNTER — Ambulatory Visit (INDEPENDENT_AMBULATORY_CARE_PROVIDER_SITE_OTHER): Payer: Medicaid Other | Admitting: Physician Assistant

## 2021-02-13 ENCOUNTER — Ambulatory Visit (HOSPITAL_COMMUNITY)
Admission: RE | Admit: 2021-02-13 | Discharge: 2021-02-13 | Disposition: A | Discharge status: Home / Self Care | Payer: Medicaid Other | Source: Ambulatory Visit | Attending: Vascular Surgery | Admitting: Vascular Surgery

## 2021-02-13 VITALS — BP 128/80 | HR 98 | Temp 97.4°F | Resp 20 | Ht 62.0 in | Wt 92.1 lb

## 2021-02-13 DIAGNOSIS — N186 End stage renal disease: Secondary | ICD-10-CM | POA: Diagnosis not present

## 2021-02-13 NOTE — Progress Notes (Signed)
    Postoperative Access Visit   History of Present Illness   Susan Evans is a 65 y.o. year old female who presents for postoperative follow-up for: right brachiocephalic arteriovenous fistula by Dr. Donzetta Matters (Date: 12/27/20).  The patient's wounds are healed.  The patient denies steal symptoms.  The patient is able to complete their activities of daily living.  She is dialyzing via L [facility] TDC at [address].   Physical Examination   Vitals:   02/13/21 1538  BP: 128/80  Pulse: 98  Resp: 20  Temp: (!) 97.4 F (36.3 C)  TempSrc: Temporal  SpO2: 96%  Weight: 92 lb 1.6 oz (41.8 kg)  Height: 5\' 2"  (1.575 m)   Body mass index is 16.85 kg/m.  right arm Incision is healed, palpable radial pulse, hand grip is 5/5, sensation in digits is intact, palpable thrill, bruit can be auscultated     Medical Decision Making   Susan Evans is a 65 y.o. year old female who presents s/p right brachiocephalic arteriovenous fistula   Patent R brachiocephalic fistula without signs or symptoms of steal syndrome  The patient's access will be ready for use 03/28/21  The patient's tunneled dialysis catheter can be removed when Nephrology is comfortable with the performance of the fistula  The patient may follow up on a prn basis   Dagoberto Ligas PA-C [facility] [place] Office: [phone]  Clinic MD: Donzetta Matters

## 2021-02-16 ENCOUNTER — Encounter: Payer: Self-pay | Admitting: Internal Medicine

## 2021-02-16 ENCOUNTER — Other Ambulatory Visit: Payer: Self-pay

## 2021-02-16 ENCOUNTER — Ambulatory Visit: Payer: Medicaid Other | Admitting: Internal Medicine

## 2021-02-16 DIAGNOSIS — R636 Underweight: Secondary | ICD-10-CM

## 2021-02-16 DIAGNOSIS — E119 Type 2 diabetes mellitus without complications: Secondary | ICD-10-CM

## 2021-02-16 DIAGNOSIS — I10 Essential (primary) hypertension: Secondary | ICD-10-CM | POA: Diagnosis not present

## 2021-02-16 DIAGNOSIS — E118 Type 2 diabetes mellitus with unspecified complications: Secondary | ICD-10-CM | POA: Diagnosis not present

## 2021-02-16 MED ORDER — INSULIN GLARGINE 100 UNIT/ML ~~LOC~~ SOLN: SUBCUTANEOUS | 4 refills | Status: DC

## 2021-02-16 NOTE — Patient Instructions (Addendum)
Ms. SAMAMTHA TIEGS,  It was a pleasure to see you today. Thank you for coming in.   Today we discussed your diabetes. In regards to this please increase the lantus to 38 units daily. Continue your Novolog 5 units three times per day. Please continue checking your blood sugars and bring in your meter with you on your next visit.   We also discussed your weight loss. This is a little better today however it is still low. This could be related to your uncontrolled diabetes and your renal disease. Please continue eating a healthy diet and keep track of how much food you are eating.    Please return to clinic in 1 month or sooner if needed.   Thank you again for coming in.   [name]

## 2021-02-16 NOTE — Progress Notes (Signed)
Internal Medicine Clinic Attending ° °Case discussed with Dr. Krienke  At the time of the visit.  We reviewed the resident’s history and exam and pertinent patient test results.  I agree with the assessment, diagnosis, and plan of care documented in the resident’s note.  °

## 2021-02-16 NOTE — Assessment & Plan Note (Signed)
Patient is currently on Lantus 30 units daily and NovoLog 5 units 3 times daily.  She denies any issues taking her medications.  She states that her blood sugars have been up around 200s to 300s, she has been checking it 3 times per day.  She does not have her meter with her today.  She denies any hypoglycemic readings or hypoglycemic symptoms.  She denies any polyuria, polydipsia, nocturia, abdominal discomfort, nausea, vomiting, diarrhea, or constipation.  Her last A1c was above 10.  Patient has a very low BMI, but up 5 pounds from her last visit.  Discussed that her uncontrolled diabetes could be contributing to this.  Will need better glucose control.  We had discussed a CGM however patient stated that she did not want to have this placed and that she has been checking her blood sugars 3 times per day.  -Increase Lantus to 38 units daily -Continue NovoLog 5 units 3 times daily -Continue checking blood sugars 3-4 times per day -Advised patient to bring in meter on next visit -Consider discussing CGM placement on follow-up visit

## 2021-02-16 NOTE — Assessment & Plan Note (Signed)
Patient has been having significant weight loss ever since starting her dialysis session, she had been up to 130s and has been having a steadily decreased since 2020.  Her BMI at her last office visit was 16.1, today her BMI is 17.3. She denies any issues with eating, denies any dysphagia, nausea, vomiting, abdominal pain, diarrhea, constipation, decreased appetite, or other symptoms.  She does not monitor her calories however feels like she is eating a lot.  She does her own cooking and grocery shopping she has been using the Nupro with her dialysis sessions. Cause for her weight loss is likely multifactorial secondary to her ESRD and chronic illness as well as her uncontrolled diabetes.  Her TSH was normal on her last visit.  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  She has no symptoms of recurrence at this time.  Weight is actually up from her last visit, encouraged good protein intake and healthy diet.  -Improved diabetes control -Advised to monitor caloric intake and continue a high-protein and healthy diet -Follow-up in 1 month

## 2021-02-16 NOTE — Assessment & Plan Note (Signed)
Blood pressure today is elevated at 139/67.  Blood pressures at home have been around 120-130s.  -Continue verapamil, hydralazine, clonidine

## 2021-02-16 NOTE — Progress Notes (Signed)
   CC: Diabetes, weight loss  HPI:  [name] is a 65 y.o. with the history listed below presenting for diabetes and weight loss.   Past Medical History:  Diagnosis Date  . AKI (acute kidney injury) ([place]) 08/12/2019  . Chronic kidney disease    Dialysis T/Th/Sat- [facility]  . 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 Diabetes mellitus    type 2   . Dyspnea    "when I have too much fluid."  . Ganglion cyst    right wrist  . GERD (gastroesophageal reflux disease)   . History of radiation therapy 02/27/10 -04/13/10   left base of tongue, bilat neck  . Hyperlipidemia   . Hypertension   . Iron deficiency anemia   . 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 Menopause   . Thyroid disease    Hyperthyroidism  . Tobacco abuse    PT HAS QUIT   Review of Systems:   Constitutional: Negative for chills and fever.  Respiratory: Negative for shortness of breath.   Cardiovascular: Negative for chest pain and leg swelling.  Gastrointestinal: Negative for abdominal pain, nausea and vomiting.  Neurological: Negative for dizziness and headaches.    Physical Exam:  Vitals:   02/16/21 1015  Weight: 89 lb (40.4 kg)  Height: 5' (1.524 m)   Physical Exam Constitutional:      Comments: Anorexic appearing  HENT:     Mouth/Throat:     Mouth: Mucous membranes are moist.     Pharynx: Oropharynx is clear.  Eyes:     Extraocular Movements: Extraocular movements intact.     Conjunctiva/sclera: Conjunctivae normal.  Cardiovascular:     Rate and Rhythm: Regular rhythm. Tachycardia present.     Pulses: Normal pulses.     Heart sounds: Normal heart sounds.  Pulmonary:     Effort: Pulmonary effort is normal.     Breath sounds: Normal breath sounds.  Abdominal:     General: Abdomen is flat. Bowel sounds are normal.     Palpations: Abdomen is soft.  Neurological:     General: No focal  deficit present.     Mental Status: She is alert and oriented to person, place, and time.  Psychiatric:        Mood and Affect: Mood normal.        Behavior: Behavior normal.     Comments: Flat affect      Assessment & Plan:   See Encounters Tab for problem based charting.  Patient discussed with Dr. Heber [place]

## 2021-02-17 ENCOUNTER — Encounter: Payer: Self-pay | Admitting: *Deleted

## 2021-02-20 DIAGNOSIS — Z992 Dependence on renal dialysis: Secondary | ICD-10-CM | POA: Diagnosis not present

## 2021-02-20 DIAGNOSIS — N186 End stage renal disease: Secondary | ICD-10-CM | POA: Diagnosis not present

## 2021-02-20 DIAGNOSIS — I129 Hypertensive chronic kidney disease with stage 1 through stage 4 chronic kidney disease, or unspecified chronic kidney disease: Secondary | ICD-10-CM | POA: Diagnosis not present

## 2021-02-22 DIAGNOSIS — N186 End stage renal disease: Secondary | ICD-10-CM | POA: Diagnosis not present

## 2021-03-14 ENCOUNTER — Ambulatory Visit: Payer: Medicaid Other | Admitting: Student

## 2021-03-14 VITALS — BP 145/70 | HR 93 | Temp 98.2°F | Ht 62.0 in | Wt 93.0 lb

## 2021-03-14 DIAGNOSIS — E118 Type 2 diabetes mellitus with unspecified complications: Secondary | ICD-10-CM

## 2021-03-14 DIAGNOSIS — I10 Essential (primary) hypertension: Secondary | ICD-10-CM

## 2021-03-14 DIAGNOSIS — Z0000 Encounter for general adult medical examination without abnormal findings: Secondary | ICD-10-CM

## 2021-03-14 DIAGNOSIS — R636 Underweight: Secondary | ICD-10-CM | POA: Diagnosis not present

## 2021-03-14 DIAGNOSIS — Z1211 Encounter for screening for malignant neoplasm of colon: Secondary | ICD-10-CM

## 2021-03-14 DIAGNOSIS — Z23 Encounter for immunization: Secondary | ICD-10-CM | POA: Diagnosis not present

## 2021-03-14 DIAGNOSIS — Z720 Tobacco use: Secondary | ICD-10-CM | POA: Diagnosis not present

## 2021-03-14 NOTE — Patient Instructions (Addendum)
[name],   Thank you for your visit to the [facility] today. It was a pleasure seeing you. Today we discussed the following:  1) Diabetes - It sounds like your blood sugars have improved since last visit. Keep doing what you have been doing - take your Lantus 30 units nightly and Novolog 5 units three times daily with meals. - Continue to check your blood sugars fasting, before meals, and at bedtime. BRING YOUR GLUCOMETER to your next appointment.   2) Healthcare maintenance - You received your tetanus shot and pneumonia shot today. - I have ordered the Fit testing kit, which you will pick up today and return to clinic at your earliest convenience. This tests for blood in your stool. If positive, the next step would be colonoscopy. - I recommend you get your COVID booster shot at your earliest convenience. - At your next visit, we will also discuss your Shingles vaccine and pap smear.  Please return to clinic in 47-monthfor diabetes follow-up. It is very important that you bring your glucometer to this appointment.   It has been an honor being your primary care doctor over the last year. You have been assigned a new PCP, Dr. SBenancio Deeds If you haven't already, you should be receiving a letter in the mail with this information. Please call the clinic on or after July 6 to schedule your next follow-up appointment.    If you have any questions or concerns, please call our clinic at [phone] 9am-5pm. Outside of these hours, call [phone] ask for the internal medicine resident on call. If you feel you are having a medical emergency please call 911.

## 2021-03-14 NOTE — Progress Notes (Signed)
Office Visit   Patient ID: Susan Evans, female    DOB: 03/09/1956, 65 y.o.   MRN: [id number]   PCP: Alexandria Lodge, MD   Subjective:   CC: 39-monthfollow-up for diabetes  HPI:  Ms.Susan L BDorneris a 65y.o. woman with history as below who presents to clinic for 17-monthollow-up of diabetes. Her last clinic visit was on 02/16/21 with Dr. KrSherry Ruffing  To see the details of this patient's management of their acute and chronic problems, please refer to the Assessment & Plan under the Encounters tab.   Review of Systems:   Review of Systems  Constitutional:  Negative for chills, fever, malaise/fatigue and weight loss.  Eyes:  Negative for blurred vision.  Respiratory:  Negative for shortness of breath.   Cardiovascular:  Negative for chest pain.  Gastrointestinal:  Negative for nausea and vomiting.  Genitourinary:  Negative for frequency.  Neurological:  Negative for dizziness, weakness and headaches.  Endo/Heme/Allergies:  Negative for polydipsia.   Past Medical History:  Diagnosis Date   AKI (acute kidney injury) ([facility]/[place]   Chronic kidney disease    Dialysis T/Th/Sat- [address]   CKD stage G1/A3, GFR > 90 and albumin creatinine ratio >300 mg/g 07/08/2016   Nephrotic range proteinuria   Diabetes mellitus    type 2    Dyspnea    "when I have too much fluid."   Ganglion cyst    right wrist   GERD (gastroesophageal reflux disease)    History of radiation therapy 02/27/10 -04/13/10   left base of tongue, bilat neck   Hyperlipidemia    Hypertension    Iron deficiency anemia    [place]   oropharyngeal carcinoma   Menopause    Thyroid disease    Hyperthyroidism   Tobacco abuse    PT HAS QUIT       ACTIVE MEDICATIONS   Outpatient Medications Prior to Visit  Medication Sig Dispense Refill   Accu-Chek FastClix Lancets MISC 5 (FIVE) TIMES DAILY DIAGNOSIS CODE E11.9 204 each 8   atorvastatin (LIPITOR) 40 MG tablet Take 1 tablet  (40 mg total) by mouth daily. 90 tablet 3   Blood Glucose Monitoring Suppl (ACCU-CHEK AVIVA PLUS) w/Device KIT Use to test blood glucose  3 times daily 1 kit 0   calcium-vitamin D (CALCIUM 500+D HIGH POTENCY) 500-400 MG-UNIT tablet Take 1 tablet by mouth daily. 90 tablet 3   Cholecalciferol (CVS D3) 50 MCG (2000 UT) CAPS Take 1 capsule (2,000 Units total) by mouth daily. 90 capsule 3   cloNIDine (CATAPRES - DOSED IN MG/24 HR) 0.2 mg/24hr patch PLACE 1 PATCH (0.2 MG TOTAL) ONTO THE SKIN ONCE A WEEK. 4 patch 3   glucose blood (ACCU-CHEK GUIDE) test strip TEST 5 (FIVE) TIMES DAILY DIAGNOSIS CODE E11.9 150 strip 12   hydrALAZINE (APRESOLINE) 50 MG tablet Take 1 tablet (50 mg total) by mouth 2 (two) times daily. 60 tablet 3   HYDROcodone-acetaminophen (NORCO) 5-325 MG tablet Take 1 tablet by mouth every 6 (six) hours as needed for moderate pain. 10 tablet 0   insulin aspart (NOVOLOG FLEXPEN) 100 UNIT/ML FlexPen INJECT 5 units WITH BREAKFAST, 5 units WITH LUNCH, AND 5 units WITH DINNER (Patient taking differently: Inject 5 Units into the skin 3 (three) times daily with meals. INJECT 5 units WITH BREAKFAST, 5 units WITH LUNCH, AND 5 units WITH DINNER) 15 mL 6   insulin glargine (LANTUS) 100 UNIT/ML injection Inject 0.38 mL into the  skin once daily 38 mL 4   Insulin Pen Needle (B-D UF III MINI PEN NEEDLES) 31G X 5 MM MISC USE TO INJECT NOVOLOG 3 TIMES A DAY. DIAGNOSIS CODE E11.9, Z79.4 100 each 8   Insulin Syringe-Needle U-100 31G X 15/64" 0.3 ML MISC USE TO INJECT LANTUS INSULIN TWO TIMES A DAY 180 each 3   loratadine (CLARITIN) 10 MG tablet Take 1 tablet (10 mg total) by mouth daily. 30 tablet 2   omeprazole (PRILOSEC) 20 MG capsule Take 1 capsule (20 mg total) by mouth daily. 30 capsule 2   Polyethyl Glycol-Propyl Glycol (SYSTANE OP) Apply 1 drop to eye daily as needed (for dry eyes).     verapamil (CALAN-SR) 180 MG CR tablet Take 2 tablets (360 mg total) by mouth at bedtime. 60 tablet 3   No  facility-administered medications prior to visit.   Objective:   BP (!) 145/70   Pulse 93   Temp 98.2 F (36.8 C) (Oral)   Ht _0  (1.575 m)   Wt 93 lb (42.2 kg)   SpO2 100%   BMI 17.01 kg/m  Wt Readings from Last 3 Encounters:  03/14/21 93 lb (42.2 kg)  02/16/21 89 lb (40.4 kg)  02/13/21 92 lb 1.6 oz (41.8 kg)   BP Readings from Last 3 Encounters:  03/14/21 (!) 145/70  02/16/21 139/67  02/13/21 128/80   Constitutional: thin, frail woman sitting in chair, in no acute distress HENT: normocephalic atraumatic, mucous membranes moist Eyes: conjunctiva non-erythematous Neck: supple Cardiovascular: regular rate and rhythm, no m/r/g, no lower extremity edema, palpable thrill in RUE fistula Pulmonary/Chest: normal work of breathing on room air, lungs clear to auscultation bilaterally Abdominal: soft, non-distended Neurological: alert & oriented x 3, normal gait Skin: warm and dry Psych: normal mood and affect  Health Maintenance:   Health Maintenance  Topic Date Due   COVID-19 Vaccine (1) Never done   Pneumococcal Vaccine 79-71 Years old (1 - PCV) Never done   Zoster Vaccines- Shingrix (1 of 2) Never done   COLON CANCER SCREENING ANNUAL FOBT  02/15/2009   COLONOSCOPY (Pts 45-18yr Insurance coverage will need to be confirmed)  07/25/2016   PAP SMEAR-Modifier  11/09/2019   OPHTHALMOLOGY EXAM  12/13/2020   LIPID PANEL  02/28/2021   HEMOGLOBIN A1C  04/11/2021   INFLUENZA VACCINE  04/23/2021   MAMMOGRAM  12/05/2021   FOOT EXAM  03/14/2022   TETANUS/TDAP  03/15/2031   Hepatitis C Screening  Completed   HIV Screening  Completed   HPV VACCINES  Aged Out   Assessment & Plan:   Problem List Items Addressed This Visit       Cardiovascular and Mediastinum   Essential hypertension (Chronic)    BP elevated at 149/71->145/70 on repeat. Patient has previously reported improved systolic BP measurements at home, around 120s-130s. Patient reports adherence to her  antihypertensives.   A/P: Patient has ESRD and is on dialysis, so her BP is monitored closely by nephrology. I will not make adjustments to her regimen at this time.  - continue verapamil, hydralazine, clonidine           Endocrine   Type 2 diabetes mellitus with complication (HCC) (Chronic)    Patient presents for DM follow-up. During her last visit ~1 month ago (02/16/21), she reported elevated blood sugars, up around 200s-300s. She did not bring her glucometer to that visit. She was advised to increase her Lantus from 30 units nightly to 38 units and continue her Novolog  5 units three times daily. CGM was discussed, however patient declined.  Today, patient states she is not aware of instruction to increase her Lantus, so she has been continuing her previous regimen. She forgot her glucometer again today. She reports her fasting blood sugars have been improved from prior, ~140, and she denies low blood sugars (lowest 105). When asked what changed, she states she has been avoiding sweets.  Last A1c 10.2% on 01/10/21, previously well-controlled.  A/P: Unfortunately patient forgot her glucometer again today. She also did not make the recommended increase in her Lantus from last visit, however per her report, her fasting blood sugars have been improved. Based on this report, I will have her continue her regimen as is as low blood sugars would be very dangerous in this frail patient living with ESRD on HD. She is due for repeat A1c in 17-month She again declines CGM placement today.  - continue Lantus 30 units nightly, Novolog 5 units three times daily with meals - RTC in 123-monthor DM follow-up, A1c - Reminded patient several times to bring her glucometer to next appointment         Other   Health care maintenance    - Received Tdap today - Received Prevnar 20 today - Foot exam performed today - Patient scheduled to see ophtho for diabetic eye exam next month (03/2021) - Fit test ordered  and given to patient today. - Discuss Shingles vaccine, pap smear at next visit  Note: we will need to have shared decision making regarding cancer surveillance with patient given her prognosis living with ESRD       Low weight    Patient has gained 4 lb since last visit. She states she is feeling well overall.  - continue to monitor       Tobacco use    Patient states she quit smoking 2 days ago. Revisit at follow-up appointment.       Other Visit Diagnoses     Colon cancer screening    -  Primary   Relevant Orders   Fecal occult blood, imunochemical   Healthcare maintenance       Relevant Orders   Pneumococcal conjugate vaccine 20-valent (Prevnar 20) (Completed)   Tdap vaccine greater than or equal to 7yo IM (Completed)      Return in about 1 month (around 04/13/2021) for DM follow-up (A1c).  Pt discussed with Dr. WiJimmye Norman JuAlexandria LodgeMD Internal Medicine Resident, PGY-1 [facility] Pager: [phone] AM, 03/16/2021

## 2021-03-16 ENCOUNTER — Encounter: Payer: Self-pay | Admitting: Student

## 2021-03-16 NOTE — Assessment & Plan Note (Signed)
Patient has gained 4 lb since last visit. She states she is feeling well overall.  - continue to monitor

## 2021-03-16 NOTE — Assessment & Plan Note (Signed)
BP elevated at 149/71->145/70 on repeat. Patient has previously reported improved systolic BP measurements at home, around 120s-130s. Patient reports adherence to her antihypertensives.   A/P: Patient has ESRD and is on dialysis, so her BP is monitored closely by nephrology. I will not make adjustments to her regimen at this time.  - continue verapamil, hydralazine, clonidine

## 2021-03-16 NOTE — Assessment & Plan Note (Addendum)
-   Received Tdap today - Received Prevnar 20 today - Foot exam performed today - Patient scheduled to see ophtho for diabetic eye exam next month (03/2021) - Fit test ordered and given to patient today. - Discuss Shingles vaccine, pap smear at next visit  Note: we will need to have shared decision making regarding cancer surveillance with patient given her prognosis living with ESRD

## 2021-03-16 NOTE — Assessment & Plan Note (Signed)
Patient presents for DM follow-up. During her last visit ~1 month ago (02/16/21), she reported elevated blood sugars, up around 200s-300s. She did not bring her glucometer to that visit. She was advised to increase her Lantus from 30 units nightly to 38 units and continue her Novolog 5 units three times daily. CGM was discussed, however patient declined.  Today, patient states she is not aware of instruction to increase her Lantus, so she has been continuing her previous regimen. She forgot her glucometer again today. She reports her fasting blood sugars have been improved from prior, ~140, and she denies low blood sugars (lowest 105). When asked what changed, she states she has been avoiding sweets.  Last A1c 10.2% on 01/10/21, previously well-controlled.  A/P: Unfortunately patient forgot her glucometer again today. She also did not make the recommended increase in her Lantus from last visit, however per her report, her fasting blood sugars have been improved. Based on this report, I will have her continue her regimen as is as low blood sugars would be very dangerous in this frail patient living with ESRD on HD. She is due for repeat A1c in 28-month. She again declines CGM placement today.  - continue Lantus 30 units nightly, Novolog 5 units three times daily with meals - RTC in 56-month for DM follow-up, A1c - Reminded patient several times to bring her glucometer to next appointment

## 2021-03-16 NOTE — Assessment & Plan Note (Signed)
Patient states she quit smoking 2 days ago. Revisit at follow-up appointment.

## 2021-03-19 ENCOUNTER — Other Ambulatory Visit: Payer: Self-pay | Admitting: Internal Medicine

## 2021-03-19 DIAGNOSIS — R1013 Epigastric pain: Secondary | ICD-10-CM

## 2021-03-22 DIAGNOSIS — I129 Hypertensive chronic kidney disease with stage 1 through stage 4 chronic kidney disease, or unspecified chronic kidney disease: Secondary | ICD-10-CM | POA: Diagnosis not present

## 2021-03-22 DIAGNOSIS — Z992 Dependence on renal dialysis: Secondary | ICD-10-CM | POA: Diagnosis not present

## 2021-03-22 DIAGNOSIS — N186 End stage renal disease: Secondary | ICD-10-CM | POA: Diagnosis not present

## 2021-03-27 NOTE — Progress Notes (Signed)
Internal Medicine Clinic Attending  Case discussed with Dr. Watson  At the time of the visit.  We reviewed the resident's history and exam and pertinent patient test results.  I agree with the assessment, diagnosis, and plan of care documented in the resident's note.  

## 2021-04-03 ENCOUNTER — Other Ambulatory Visit: Payer: Self-pay | Admitting: Student

## 2021-04-03 DIAGNOSIS — E119 Type 2 diabetes mellitus without complications: Secondary | ICD-10-CM

## 2021-04-11 LAB — HM DIABETES EYE EXAM

## 2021-04-12 ENCOUNTER — Encounter: Payer: Self-pay | Admitting: *Deleted

## 2021-04-13 ENCOUNTER — Ambulatory Visit: Payer: Medicaid Other | Admitting: Student

## 2021-04-13 ENCOUNTER — Other Ambulatory Visit: Payer: Self-pay

## 2021-04-13 ENCOUNTER — Encounter: Payer: Self-pay | Admitting: Student

## 2021-04-13 VITALS — BP 136/68 | HR 98 | Temp 98.2°F | Ht 62.0 in | Wt 93.1 lb

## 2021-04-13 DIAGNOSIS — H34211 Partial retinal artery occlusion, right eye: Secondary | ICD-10-CM

## 2021-04-13 DIAGNOSIS — E119 Type 2 diabetes mellitus without complications: Secondary | ICD-10-CM | POA: Diagnosis not present

## 2021-04-13 DIAGNOSIS — E118 Type 2 diabetes mellitus with unspecified complications: Secondary | ICD-10-CM

## 2021-04-13 DIAGNOSIS — Z794 Long term (current) use of insulin: Secondary | ICD-10-CM

## 2021-04-13 LAB — POCT GLYCOSYLATED HEMOGLOBIN (HGB A1C): Hemoglobin A1C: 8.1 % — AB (ref 4.0–5.6)

## 2021-04-13 LAB — GLUCOSE, CAPILLARY: Glucose-Capillary: 302 mg/dL — ABNORMAL HIGH (ref 70–99)

## 2021-04-13 MED ORDER — "INSULIN SYRINGE-NEEDLE U-100 31G X 15/64"" 0.3 ML MISC": 3 refills | Status: DC

## 2021-04-13 MED ORDER — ATORVASTATIN CALCIUM 80 MG PO TABS: 80.0000 mg | ORAL_TABLET | Freq: Every day | ORAL | 11 refills | Status: DC

## 2021-04-13 MED ORDER — INSULIN GLARGINE 100 UNIT/ML ~~LOC~~ SOLN: SUBCUTANEOUS | 4 refills | Status: DC

## 2021-04-13 NOTE — Patient Instructions (Signed)
Thank you, Ms.[name] for allowing [place] to provide your care today. Today we discussed   Diabetes We will be checking your A1c today. Because your morning sugars have been 190, we will increase your nightly lantus to 34 units. Please call our office if you notice that your sugars are too high (over 200) or too low (under 80). Our goal for morning sugars are between 100-125.   We have ordered a ultrasound of the blood vessels in your neck. Please be on the lookout for a phone call from them.   We have also increased your cholesterol medicine from 40 mg to 80 mg   I have ordered the following labs for you:   Lab Orders  Glucose, capillary  POC Hbg A1C    Tests ordered today:  Bilateral carotid Ultrasound  Referrals ordered today:   Referral Orders  No referral(s) requested today     I have ordered the following medication/changed the following medications:   Stop the following medications: Medications Discontinued During This Encounter  Medication Reason   atorvastatin (LIPITOR) 40 MG tablet    Insulin Syringe-Needle U-100 31G X 15/64" 0.3 ML MISC Reorder   insulin glargine (LANTUS) 100 UNIT/ML injection Reorder     Start the following medications: Meds ordered this encounter  Medications   atorvastatin (LIPITOR) 80 MG tablet    Sig: Take 1 tablet (80 mg total) by mouth daily.    Dispense:  30 tablet    Refill:  11   insulin glargine (LANTUS) 100 UNIT/ML injection    Sig: Inject 0.34 mL into the skin once daily    Dispense:  38 mL    Refill:  4   Insulin Syringe-Needle U-100 31G X 15/64" 0.3 ML MISC    Sig: USE TO INJECT LANTUS INSULIN TWO TIMES A DAY    Dispense:  180 each    Refill:  3    Do not fill until patients requests     Follow up:  1 month for diabetes check     Should you have any questions or concerns please call the internal medicine clinic at [phone].     Sanjuana Letters, D.O. [place]

## 2021-04-13 NOTE — Assessment & Plan Note (Signed)
Assessment: A1c of 8.2% today from 10.2%.  Current regimen of Lantus 30 units nightly and NovoLog 5 units 3 times a day.  States that her glucose readings are around 190 in the morning and 300 postprandially.  Despite this is improvement in A1c her morning glucose readings are elevated.  Plan will be to increase Lantus to 34 units in the evening and continue NovoLog 5 units 3 times daily.  Patient instructed to bring in glucometer at next visit as she forgot it again today.  Plan: -Increase Lantus 34 units and continue NovoLog 5 units 3 times daily with meals -Repeat A1c in 3 months

## 2021-04-13 NOTE — Assessment & Plan Note (Addendum)
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  As such patient was recommended to have bilateral carotid ultrasounds obtained if not done in a while.  Most recent studies were in 2017.  We will plan to repeat today and we will also increase her atorvastatin from 40 mg to 80 mg daily as her most recent lipid panel her LDL was over 70.  Plan: -Increase atorvastatin to 80 mg daily -Bilateral carotid ultrasound ordered  Addendum:  Bilateral carotid stenosis 1-39%. Will continue with treatment of her hyperlipidemia.

## 2021-04-13 NOTE — Progress Notes (Signed)
CC: Type 2 diabetes mellitus  HPI:  Susan Evans is a 65 y.o. female with a past medical history stated below and presents today for follow-up of her diabetic treatment please see problem based assessment and plan for additional details.  Past Medical History:  Diagnosis Date   AKI (acute kidney injury) ([place]) 08/12/2019   Chronic kidney disease    Dialysis T/Th/Sat- [address]   CKD stage G1/A3, GFR > 90 and albumin creatinine ratio >300 mg/g 07/08/2016   Nephrotic range proteinuria   Diabetes mellitus    type 2    Dyspnea    "when I have too much fluid."   Ganglion cyst    right wrist   GERD (gastroesophageal reflux disease)    History of radiation therapy 02/27/10 -04/13/10   left base of tongue, bilat neck   Hyperlipidemia    Hypertension    Iron deficiency anemia    Malignant neoplasm ([place]) 04/11   oropharyngeal carcinoma   Menopause    Thyroid disease    Hyperthyroidism   Tobacco abuse    PT HAS QUIT    Current Outpatient Medications on File Prior to Visit  Medication Sig Dispense Refill   Accu-Chek FastClix Lancets MISC 5 (FIVE) TIMES DAILY DIAGNOSIS CODE E11.9 204 each 8   B-D UF III MINI PEN NEEDLES 31G X 5 MM MISC USE TO INJECT NOVOLOG 3 TIMES A DAY. DIAGNOSIS CODE E11.9, Z79.4 100 each 8   Blood Glucose Monitoring Suppl (ACCU-CHEK AVIVA PLUS) w/Device KIT Use to test blood glucose  3 times daily 1 kit 0   calcium-vitamin D (CALCIUM 500+D HIGH POTENCY) 500-400 MG-UNIT tablet Take 1 tablet by mouth daily. 90 tablet 3   Cholecalciferol (CVS D3) 50 MCG (2000 UT) CAPS Take 1 capsule (2,000 Units total) by mouth daily. 90 capsule 3   cloNIDine (CATAPRES - DOSED IN MG/24 HR) 0.2 mg/24hr patch PLACE 1 PATCH (0.2 MG TOTAL) ONTO THE SKIN ONCE A WEEK. 4 patch 3   glucose blood (ACCU-CHEK GUIDE) test strip TEST 5 (FIVE) TIMES DAILY DIAGNOSIS CODE E11.9 150 strip 12   hydrALAZINE (APRESOLINE) 50 MG tablet Take 1 tablet (50 mg total) by mouth 2 (two) times  daily. 60 tablet 3   HYDROcodone-acetaminophen (NORCO) 5-325 MG tablet Take 1 tablet by mouth every 6 (six) hours as needed for moderate pain. 10 tablet 0   insulin aspart (NOVOLOG FLEXPEN) 100 UNIT/ML FlexPen INJECT 5 units WITH BREAKFAST, 5 units WITH LUNCH, AND 5 units WITH DINNER (Patient taking differently: Inject 5 Units into the skin 3 (three) times daily with meals. INJECT 5 units WITH BREAKFAST, 5 units WITH LUNCH, AND 5 units WITH DINNER) 15 mL 6   loratadine (CLARITIN) 10 MG tablet Take 1 tablet (10 mg total) by mouth daily. 30 tablet 2   omeprazole (PRILOSEC) 20 MG capsule TAKE 1 CAPSULE BY MOUTH EVERY DAY 90 capsule 1   Polyethyl Glycol-Propyl Glycol (SYSTANE OP) Apply 1 drop to eye daily as needed (for dry eyes).     verapamil (CALAN-SR) 180 MG CR tablet Take 2 tablets (360 mg total) by mouth at bedtime. 60 tablet 3   No current facility-administered medications on file prior to visit.    Family History  Problem Relation Age of Onset   Stroke Father    Cancer Father        unknown ca   Hypertension Mother    Diabetes Mother    Chronic Renal Failure Sister    Chronic  Renal Failure Brother    Cancer Paternal Aunt        colon ca   Breast cancer Neg Hx     Social History   Socioeconomic History   Marital status: Single    Spouse name: Not on file   Number of children: Not on file   Years of education: 11   Highest education level: Not on file  Occupational History    Employer: UNEMPLOYED    Employer: UNEMPLOYED  Tobacco Use   Smoking status: Former    Packs/day: 0.30    Types: Cigarettes    Quit date: 04/03/2021    Years since quitting: 0.0   Smokeless tobacco: Never   Tobacco comments:    1 cig per day   Vaping Use   Vaping Use: Never used  Substance and Sexual Activity   Alcohol use: No    Alcohol/week: 0.0 standard drinks   Drug use: No   Sexual activity: Not on file  Other Topics Concern   Not on file  Social History Narrative   Not on file    Social Determinants of Health   Financial Resource Strain: Not on file  Food Insecurity: Not on file  Transportation Needs: Not on file  Physical Activity: Not on file  Stress: Not on file  Social Connections: Not on file  Intimate Partner Violence: Not on file    Review of Systems: ROS negative except for what is noted on the assessment and plan.  Vitals:   04/13/21 1020  BP: 136/68  Pulse: 98  Temp: 98.2 F (36.8 C)  TempSrc: Oral  SpO2: 100%  Weight: 93 lb 1.6 oz (42.2 kg)  Height: 5' 2"  (1.575 m)   Physical Exam: Constitutional: Well-appearing, no acute distress HENT: normocephalic atraumatic, mucous membranes moist Eyes: conjunctiva non-erythematous Neck: supple Cardiovascular: regular rate and rhythm, no m/r/g.  Right-sided carotid bruit. Pulmonary/Chest: normal work of breathing on room air MSK: normal bulk and tone Neurological: alert & oriented x 3, 5/5 strength in bilateral upper and lower extremities, normal gait Skin: warm and dry Psych:   Assessment & Plan:   See Encounters Tab for problem based charting.  Patient discussed with Dr. Newell Coral, D.O. [facility], PGY-2 Pager: [phone], Phone: [phone] Date 04/13/2021 Time 5:36 PM

## 2021-04-14 NOTE — Progress Notes (Signed)
Internal Medicine Clinic Attending  Case discussed with Dr. Katsadouros  At the time of the visit.  We reviewed the resident's history and exam and pertinent patient test results.  I agree with the assessment, diagnosis, and plan of care documented in the resident's note.  

## 2021-04-22 DIAGNOSIS — I129 Hypertensive chronic kidney disease with stage 1 through stage 4 chronic kidney disease, or unspecified chronic kidney disease: Secondary | ICD-10-CM | POA: Diagnosis not present

## 2021-04-22 DIAGNOSIS — N186 End stage renal disease: Secondary | ICD-10-CM | POA: Diagnosis not present

## 2021-04-22 DIAGNOSIS — Z992 Dependence on renal dialysis: Secondary | ICD-10-CM | POA: Diagnosis not present

## 2021-04-23 DIAGNOSIS — N186 End stage renal disease: Secondary | ICD-10-CM | POA: Diagnosis not present

## 2021-05-07 ENCOUNTER — Other Ambulatory Visit: Payer: Self-pay | Admitting: Student

## 2021-05-07 DIAGNOSIS — M810 Age-related osteoporosis without current pathological fracture: Secondary | ICD-10-CM

## 2021-05-11 ENCOUNTER — Encounter: Payer: Medicaid Other | Admitting: Student

## 2021-05-23 DIAGNOSIS — Z992 Dependence on renal dialysis: Secondary | ICD-10-CM | POA: Diagnosis not present

## 2021-05-23 DIAGNOSIS — I129 Hypertensive chronic kidney disease with stage 1 through stage 4 chronic kidney disease, or unspecified chronic kidney disease: Secondary | ICD-10-CM | POA: Diagnosis not present

## 2021-05-23 DIAGNOSIS — N186 End stage renal disease: Secondary | ICD-10-CM | POA: Diagnosis not present

## 2021-06-05 ENCOUNTER — Other Ambulatory Visit: Payer: Self-pay | Admitting: Student

## 2021-06-05 DIAGNOSIS — I10 Essential (primary) hypertension: Secondary | ICD-10-CM

## 2021-06-06 ENCOUNTER — Other Ambulatory Visit (HOSPITAL_COMMUNITY): Payer: Self-pay | Admitting: Nephrology

## 2021-06-06 DIAGNOSIS — N186 End stage renal disease: Secondary | ICD-10-CM

## 2021-06-15 ENCOUNTER — Other Ambulatory Visit: Payer: Self-pay

## 2021-06-15 ENCOUNTER — Ambulatory Visit (HOSPITAL_COMMUNITY)
Admission: RE | Admit: 2021-06-15 | Discharge: 2021-06-15 | Disposition: A | Discharge status: Home / Self Care | Payer: Medicaid Other | Source: Ambulatory Visit | Attending: Nephrology | Admitting: Nephrology

## 2021-06-15 DIAGNOSIS — N186 End stage renal disease: Secondary | ICD-10-CM | POA: Diagnosis not present

## 2021-06-15 DIAGNOSIS — Z4901 Encounter for fitting and adjustment of extracorporeal dialysis catheter: Secondary | ICD-10-CM | POA: Insufficient documentation

## 2021-06-15 HISTORY — PX: IR REMOVAL TUN CV CATH W/O FL: IMG2289

## 2021-06-15 MED ORDER — LIDOCAINE-EPINEPHRINE 1 %-1:100000 IJ SOLN
INTRAMUSCULAR | Status: AC
  Filled 2021-06-15: qty 1

## 2021-06-15 MED ORDER — CHLORHEXIDINE GLUCONATE 4 % EX LIQD
CUTANEOUS | Status: AC
  Filled 2021-06-15: qty 15

## 2021-06-15 MED ORDER — LIDOCAINE-EPINEPHRINE 1 %-1:100000 IJ SOLN
INTRAMUSCULAR | Status: DC | PRN
  Administered 2021-06-15: 10 mL via INTRADERMAL

## 2021-06-15 NOTE — Procedures (Signed)
Successful removal of left IJ tunneled HD catheter.   After obtaining consent and performing a time-out, the left upper chest was prepped and draped in the normal sterile fashion. The heparin was removed from both ports. 1% lidocaine was used for local anesthesia. Using gentle blunt dissection and moderate manual traction the cuff of the catheter was exposed and the catheter was removed in its entirety. Pressure was held until hemostasis was obtained. A sterile dressing was applied. The patient tolerated the procedure well with no immediate complications.   Soyla Dryer, [place] [phone] 06/15/2021, 2:59 PM

## 2021-06-22 DIAGNOSIS — N186 End stage renal disease: Secondary | ICD-10-CM | POA: Diagnosis not present

## 2021-06-22 DIAGNOSIS — Z992 Dependence on renal dialysis: Secondary | ICD-10-CM | POA: Diagnosis not present

## 2021-06-22 DIAGNOSIS — I129 Hypertensive chronic kidney disease with stage 1 through stage 4 chronic kidney disease, or unspecified chronic kidney disease: Secondary | ICD-10-CM | POA: Diagnosis not present

## 2021-07-09 ENCOUNTER — Other Ambulatory Visit (HOSPITAL_COMMUNITY): Payer: Self-pay | Admitting: Nephrology

## 2021-07-09 DIAGNOSIS — N186 End stage renal disease: Secondary | ICD-10-CM

## 2021-07-11 ENCOUNTER — Ambulatory Visit (HOSPITAL_COMMUNITY)
Admission: RE | Admit: 2021-07-11 | Discharge: 2021-07-11 | Disposition: A | Discharge status: Home / Self Care | Payer: Medicare Other | Source: Ambulatory Visit | Attending: Internal Medicine | Admitting: Internal Medicine

## 2021-07-11 ENCOUNTER — Other Ambulatory Visit: Payer: Self-pay

## 2021-07-11 DIAGNOSIS — H34211 Partial retinal artery occlusion, right eye: Secondary | ICD-10-CM | POA: Insufficient documentation

## 2021-07-11 NOTE — Progress Notes (Signed)
Carotid duplex has been completed.   Preliminary results in CV Proc.   Jinny Blossom Komal Stangelo 07/11/2021 1:06 PM

## 2021-07-12 ENCOUNTER — Other Ambulatory Visit: Payer: Self-pay | Admitting: Radiology

## 2021-07-15 ENCOUNTER — Telehealth: Payer: Self-pay | Admitting: Student

## 2021-07-15 DIAGNOSIS — I10 Essential (primary) hypertension: Secondary | ICD-10-CM

## 2021-07-16 ENCOUNTER — Encounter (HOSPITAL_COMMUNITY): Payer: Self-pay

## 2021-07-16 ENCOUNTER — Ambulatory Visit (HOSPITAL_COMMUNITY): Admission: RE | Admit: 2021-07-16 | Payer: Medicaid Other | Source: Ambulatory Visit

## 2021-07-17 ENCOUNTER — Other Ambulatory Visit: Payer: Self-pay

## 2021-07-17 DIAGNOSIS — I10 Essential (primary) hypertension: Secondary | ICD-10-CM

## 2021-07-17 MED ORDER — VERAPAMIL HCL ER 180 MG PO TBCR: 360.0000 mg | EXTENDED_RELEASE_TABLET | Freq: Every day | ORAL | 3 refills | Status: DC

## 2021-07-17 NOTE — Telephone Encounter (Signed)
Attempted to contact patient via telephone to schedule an appointment.  No answer left detailed message asking patient to please give the office a call back to schedule an appointment.

## 2021-07-23 DIAGNOSIS — Z992 Dependence on renal dialysis: Secondary | ICD-10-CM | POA: Diagnosis not present

## 2021-07-23 DIAGNOSIS — I129 Hypertensive chronic kidney disease with stage 1 through stage 4 chronic kidney disease, or unspecified chronic kidney disease: Secondary | ICD-10-CM | POA: Diagnosis not present

## 2021-07-23 DIAGNOSIS — N186 End stage renal disease: Secondary | ICD-10-CM | POA: Diagnosis not present

## 2021-08-03 ENCOUNTER — Other Ambulatory Visit: Payer: Self-pay | Admitting: Student

## 2021-08-03 DIAGNOSIS — R1013 Epigastric pain: Secondary | ICD-10-CM

## 2021-08-08 ENCOUNTER — Encounter: Payer: Medicaid Other | Admitting: Student

## 2021-08-21 ENCOUNTER — Encounter: Payer: Medicaid Other | Admitting: Student

## 2021-08-24 ENCOUNTER — Emergency Department (HOSPITAL_COMMUNITY)
Admission: EM | Admit: 2021-08-24 | Discharge: 2021-08-25 | Discharge status: Against Medical Advice | Payer: Medicare Other

## 2021-08-24 NOTE — ED Notes (Signed)
Called for triage and unable to locate

## 2021-08-27 ENCOUNTER — Encounter: Payer: Medicaid Other | Admitting: Student

## 2021-08-27 ENCOUNTER — Telehealth: Payer: Self-pay

## 2021-08-27 NOTE — Telephone Encounter (Signed)
Transition Care Management Follow-up Telephone Call Date of discharge and from where: 08/25/2021 from [facility] How have you been since you were released from the hospital? Pt stated that she is congested. Pt LWBS after presenting to the ED on 08/24/2021. Pt did not have any questions or concerns as she has an appt today with PCP office.  Any questions or concerns? No  Items Reviewed: Did the pt receive and understand the discharge instructions provided? Yes  Medications obtained and verified? Yes  Other? No  Any new allergies since your discharge? No  Dietary orders reviewed? No Do you have support at home? Yes   Functional Questionnaire: (I = Independent and D = Dependent) ADLs: I  Bathing/Dressing- I  Meal Prep- I  Eating- I  Maintaining continence- I  Transferring/Ambulation- I  Managing Meds- I   Follow up appointments reviewed:  PCP Hospital f/u appt confirmed? Yes  Scheduled to see Gaylan Gerold, DO on 08/27/2021 @ 1:45pm. [facility] f/u appt confirmed? No   Are transportation arrangements needed? No  If their condition worsens, is the pt aware to call PCP or go to the Emergency Dept.? Yes Was the patient provided with contact information for the PCP's office or ED? Yes Was to pt encouraged to call back with questions or concerns? Yes

## 2021-08-31 ENCOUNTER — Encounter: Payer: Self-pay | Admitting: Student

## 2021-08-31 ENCOUNTER — Other Ambulatory Visit (HOSPITAL_COMMUNITY)
Admission: RE | Admit: 2021-08-31 | Discharge: 2021-08-31 | Disposition: A | Discharge status: Home / Self Care | Payer: Medicare Other | Source: Ambulatory Visit | Attending: Internal Medicine | Admitting: Internal Medicine

## 2021-08-31 ENCOUNTER — Ambulatory Visit (INDEPENDENT_AMBULATORY_CARE_PROVIDER_SITE_OTHER): Payer: Medicare Other | Admitting: Student

## 2021-08-31 VITALS — BP 130/65 | HR 106 | Temp 98.5°F | Wt 92.4 lb

## 2021-08-31 DIAGNOSIS — Z124 Encounter for screening for malignant neoplasm of cervix: Secondary | ICD-10-CM | POA: Insufficient documentation

## 2021-08-31 DIAGNOSIS — E118 Type 2 diabetes mellitus with unspecified complications: Secondary | ICD-10-CM

## 2021-08-31 DIAGNOSIS — I10 Essential (primary) hypertension: Secondary | ICD-10-CM | POA: Diagnosis not present

## 2021-08-31 DIAGNOSIS — J329 Chronic sinusitis, unspecified: Secondary | ICD-10-CM | POA: Diagnosis not present

## 2021-08-31 DIAGNOSIS — B9689 Other specified bacterial agents as the cause of diseases classified elsewhere: Secondary | ICD-10-CM | POA: Diagnosis not present

## 2021-08-31 DIAGNOSIS — J0100 Acute maxillary sinusitis, unspecified: Secondary | ICD-10-CM

## 2021-08-31 LAB — POCT GLYCOSYLATED HEMOGLOBIN (HGB A1C): Hemoglobin A1C: 9.7 % — AB (ref 4.0–5.6)

## 2021-08-31 LAB — GLUCOSE, CAPILLARY: Glucose-Capillary: 268 mg/dL — ABNORMAL HIGH (ref 70–99)

## 2021-08-31 MED ORDER — FLUTICASONE PROPIONATE HFA 44 MCG/ACT IN AERO: 2.0000 | INHALATION_SPRAY | Freq: Every day | RESPIRATORY_TRACT | 0 refills | Status: DC

## 2021-08-31 MED ORDER — DOXYCYCLINE HYCLATE 100 MG PO TABS
100.0000 mg | ORAL_TABLET | Freq: Two times a day (BID) | ORAL | 0 refills | Status: AC
Start: 2021-08-31 — End: 2021-09-05

## 2021-08-31 MED ORDER — PHENYLEPHRINE HCL 10 MG PO TABS: 10.0000 mg | ORAL_TABLET | ORAL | 0 refills | Status: AC | PRN

## 2021-08-31 MED ORDER — NOVOLOG FLEXPEN 100 UNIT/ML ~~LOC~~ SOPN: 7.0000 [IU] | PEN_INJECTOR | Freq: Three times a day (TID) | SUBCUTANEOUS | 2 refills | Status: DC

## 2021-08-31 NOTE — Assessment & Plan Note (Addendum)
A1c came up to 9.7 today from 8.1.  Her regimen includes Lantus 34 units daily with NovoLog 5 units 3 times daily with meals.  She reports adherence to medication.  She demonstrated good understanding of when to use NovoLog.  She denies any glycemia symptoms.  She did bring her glucometer to the visit.  However there were only 8 readings in the last month.  Her lowest was 91 and highest is 462.  There was 1 fasting reading which was 137.  She admits to drinking more sodas in the last few months due to the holiday.  She said that she will cut back completely.  Assessment and plan -Advised patient on cutting back on sugary drinks -Will increase NovoLog to 7 units 3 times daily. -Continue Lantus 34 units daily -I emphasized the importance of consistent CBG checking to patient.  I explained to her her risk of hypoglycemia is very high given her ESRD.  I informed patient that if she does not check her CBG, we have to take her off of her mealtime insulin for her safety.  She verbalizes understanding. -Follow-up in 4 weeks for blood sugar recheck -She has an appointment for eye exam in February

## 2021-08-31 NOTE — Assessment & Plan Note (Signed)
Patient endorses symptoms of coughing with sputum, sinus tenderness, and thick yellow nasal discharge that has been going on for 2 weeks.  Stated symptoms may become better.  She denies fever or chills but endorses some mild shortness of breath.  She denies sick contacts.  She has been using over-the-counter cough medicine and Mucinex without much relief.  Physical exam showed lungs are clear to auscultation bilaterally.  Audible heart sounds from chest and nasal congestion.  Posterior pharynx erythematous without any discharge.  Nasal turbinates erythematous without obstruction.  Assessment and plan Given her prolonged duration of symptoms, will start a short course of antibiotics.  Patient however has a penicillin allergy.  She states that penicillin makes her hair fall out.  I told patient that it it is not a common reaction to penicillin but she insisted on not using penicillin.   -Will start doxycycline 100 mg twice daily x5 days.  No renal adjustment needed -Added nasal steroid spray and Sudafed for symptomatic treatment

## 2021-08-31 NOTE — Addendum Note (Signed)
Addended by: Marcelino Duster on: 08/31/2021 12:31 PM   Modules accepted: Orders

## 2021-08-31 NOTE — Patient Instructions (Addendum)
Susan Evans,  It was a pleasure seeing you in the clinic today.  Here is a summary what we talked about:  1.  Sinusitis: Since your symptoms have not improved for more than 2 weeks, I will start an antibiotic and doxycycline 100 mg twice daily for 5 days.  I also prescribed you Sudafed to help dry up your mucus and fluticasone nasal spray for your nose congestion.  2.  Type 2 diabetes: Your A1c is 9.7 today.  Please cut back on soda usage.  I will increase her NovoLog to 7 units 3 times with meals.  Please continue Lantus 34 units at bedtime.  You have to check your blood sugar at least once daily fasting.  Otherwise we cannot continue you on mealtime insulin for your safety.  Please return in 4 weeks  Take care,  Dr. Alfonse Spruce

## 2021-08-31 NOTE — Assessment & Plan Note (Signed)
Blood pressure well controlled at 130/65.  -Continue verapamil 360 mg daily -Continue clonidine patch 0.2 mg weekly -Continue hydralazine 50 mg twice daily

## 2021-08-31 NOTE — Progress Notes (Signed)
   CC: cold symptoms  HPI:  [name] is a 65 y.o. with past medical history of ESRD on HD, hypertension, type 2 diabetes who presented to the clinic for persistent cold symptoms.  Please see problem based charting for detailed  Past Medical History:  Diagnosis Date   AKI (acute kidney injury) ([facility]) 08/12/2019   Chronic kidney disease    Dialysis T/Th/Sat- [address]   CKD stage G1/A3, GFR > 90 and albumin creatinine ratio >300 mg/g 07/08/2016   Nephrotic range proteinuria   Diabetes mellitus    type 2    Dyspnea    "when I have too much fluid."   Ganglion cyst    right wrist   GERD (gastroesophageal reflux disease)    History of radiation therapy 02/27/10 -04/13/10   left base of tongue, bilat neck   Hyperlipidemia    Hypertension    Iron deficiency anemia    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 HENT:     Head: Normocephalic.     Nose:     Comments: Nasal turbinates erythematous with no obstruction    Mouth/Throat:     Comments: Posterior pharynx erythematous without exudates or discharge Eyes:     General:        Right eye: No discharge.        Left eye: No discharge.     Conjunctiva/sclera: Conjunctivae normal.  Cardiovascular:     Rate and Rhythm: Normal rate and regular rhythm.  Pulmonary:     Effort: Pulmonary effort is normal. No respiratory distress.     Breath sounds: Normal breath sounds. No wheezing.     Comments: Harsh upper airway sound due to congestion Musculoskeletal:        General: Normal range of motion.  Skin:    General: Skin is warm.     Coloration: Skin  is not jaundiced.  Neurological:     Mental Status: She is alert.  Psychiatric:        Mood and Affect: Mood normal.        Behavior: Behavior normal.     Assessment & Plan:   See Encounters Tab for problem based charting.  Patient discussed with Dr. Jimmye Norman

## 2021-09-03 ENCOUNTER — Other Ambulatory Visit: Payer: Self-pay | Admitting: Student

## 2021-09-03 DIAGNOSIS — R1013 Epigastric pain: Secondary | ICD-10-CM

## 2021-09-03 DIAGNOSIS — B9689 Other specified bacterial agents as the cause of diseases classified elsewhere: Secondary | ICD-10-CM

## 2021-09-05 ENCOUNTER — Other Ambulatory Visit: Payer: Self-pay | Admitting: Internal Medicine

## 2021-09-05 ENCOUNTER — Other Ambulatory Visit: Payer: Self-pay | Admitting: Student

## 2021-09-05 DIAGNOSIS — B9689 Other specified bacterial agents as the cause of diseases classified elsewhere: Secondary | ICD-10-CM

## 2021-09-05 DIAGNOSIS — J329 Chronic sinusitis, unspecified: Secondary | ICD-10-CM

## 2021-09-05 DIAGNOSIS — R1013 Epigastric pain: Secondary | ICD-10-CM

## 2021-09-05 LAB — CYTOLOGY - PAP: Adequacy: ABNORMAL

## 2021-09-10 NOTE — Progress Notes (Signed)
Internal Medicine Clinic Attending  Case discussed with Dr. Nguyen  At the time of the visit.  We reviewed the resident's history and exam and pertinent patient test results.  I agree with the assessment, diagnosis, and plan of care documented in the resident's note. 

## 2021-10-01 ENCOUNTER — Other Ambulatory Visit: Payer: Self-pay | Admitting: Student

## 2021-10-01 ENCOUNTER — Encounter: Payer: Medicare Other | Admitting: Internal Medicine

## 2021-10-01 DIAGNOSIS — B9689 Other specified bacterial agents as the cause of diseases classified elsewhere: Secondary | ICD-10-CM

## 2021-10-01 DIAGNOSIS — J329 Chronic sinusitis, unspecified: Secondary | ICD-10-CM

## 2021-10-01 DIAGNOSIS — I10 Essential (primary) hypertension: Secondary | ICD-10-CM

## 2021-11-02 ENCOUNTER — Other Ambulatory Visit: Payer: Self-pay | Admitting: Internal Medicine

## 2021-11-02 DIAGNOSIS — R1013 Epigastric pain: Secondary | ICD-10-CM

## 2021-11-11 ENCOUNTER — Other Ambulatory Visit: Payer: Self-pay | Admitting: Internal Medicine

## 2021-11-11 DIAGNOSIS — I10 Essential (primary) hypertension: Secondary | ICD-10-CM

## 2021-12-05 ENCOUNTER — Other Ambulatory Visit: Payer: Self-pay

## 2021-12-05 ENCOUNTER — Ambulatory Visit (INDEPENDENT_AMBULATORY_CARE_PROVIDER_SITE_OTHER): Payer: Medicare Other | Admitting: Vascular Surgery

## 2021-12-05 ENCOUNTER — Encounter: Payer: Self-pay | Admitting: Vascular Surgery

## 2021-12-05 VITALS — BP 108/60 | HR 99 | Temp 97.8°F | Resp 20 | Ht 62.0 in | Wt 96.0 lb

## 2021-12-05 DIAGNOSIS — T827XXA Infection and inflammatory reaction due to other cardiac and vascular devices, implants and grafts, initial encounter: Secondary | ICD-10-CM | POA: Diagnosis not present

## 2021-12-05 DIAGNOSIS — N186 End stage renal disease: Secondary | ICD-10-CM

## 2021-12-05 NOTE — H&P (View-Only) (Signed)
? ?Patient ID: Susan Evans, female   DOB: 11/30/1955, 65 y.o.   MRN: [id number] ? ?Reason for Consult: Follow-up ?  ?Referred by Patel, Jay, MD ? ?Subjective:  ?   ?HPI: ? ?Susan Evans is a 65 y.o. female with end-stage renal disease currently dialyzing Tuesday, Thursdays and Saturdays via right arm AV fistula.  She has a previous left arm AV graft which has become exposed.  She denies any fevers or chills.  She is now here for further evaluation.  She does not take any blood thinners.  ? ?Past Medical History:  ?Diagnosis Date  ? AKI (acute kidney injury) ([facility]) 08/12/2019  ? Chronic kidney disease   ? Dialysis T/Th/Sat- [address]  ? CKD stage G1/A3, GFR > 90 and albumin creatinine ratio >300 mg/g 07/08/2016  ? Nephrotic range proteinuria  ? Diabetes mellitus   ? type 2   ? Dyspnea   ? "when I have too much fluid."  ? Ganglion cyst   ? right wrist  ? GERD (gastroesophageal reflux disease)   ? History of radiation therapy 02/27/10 -04/13/10  ? left base of tongue, bilat neck  ? Hyperlipidemia   ? Hypertension   ? Iron deficiency anemia   ? Malignant neoplasm ([facility]) 04/11  ? oropharyngeal carcinoma  ? Menopause   ? Thyroid disease   ? Hyperthyroidism  ? Tobacco abuse   ? PT HAS QUIT  ? ?Family History  ?Problem Relation Age of Onset  ? Stroke Father   ? Cancer Father   ?     unknown ca  ? Hypertension Mother   ? Diabetes Mother   ? Chronic Renal Failure Sister   ? Chronic Renal Failure Brother   ? Cancer Paternal Aunt   ?     colon ca  ? Breast cancer Neg Hx   ? ?Past Surgical History:  ?Procedure Laterality Date  ? AV FISTULA PLACEMENT Left 06/02/2020  ? Procedure: INSERTION OF ARTERIOVENOUS (AV) GORE-TEX GRAFT ARM LEFT;  Surgeon: Clark, Christopher J, MD;  Location: [facility];  Service: Vascular;  Laterality: Left;  ? AV FISTULA PLACEMENT Right 12/27/2020  ? Procedure: RIGHT ARM CEPHALIC ARTERIOVENOUS FISTULA CREATION;  Surgeon: Adham Johnson Christopher, MD;  Location: [facility] OR;  Service: Vascular;   Laterality: Right;  ? IR FLUORO GUIDE CV LINE LEFT  05/15/2020  ? IR REMOVAL TUN CV CATH W/O FL  05/15/2020  ? IR REMOVAL TUN CV CATH W/O FL  06/15/2021  ? IR US GUIDE VASC ACCESS LEFT  05/15/2020  ? NECK SURGERY    ? REDUCTION MAMMAPLASTY    ? ? ?Short Social History:  ?Social History  ? ?Tobacco Use  ? Smoking status: Former  ?  Packs/day: 0.30  ?  Types: Cigarettes  ?  Quit date: 04/03/2021  ?  Years since quitting: 0.6  ? Smokeless tobacco: Never  ? Tobacco comments:  ?  1 cig per day   ?Substance Use Topics  ? Alcohol use: No  ?  Alcohol/week: 0.0 standard drinks  ? ? ?Allergies  ?Allergen Reactions  ? Hydrochlorothiazide Itching and Other (See Comments)  ?  Hyponatremia ?  ? Penicillins Other (See Comments)  ?  "takes all of her hair out" ?.Has patient had a PCN reaction causing immediate rash, facial/tongue/throat swelling, SOB or lightheadedness with hypotension: No ?Has patient had a PCN reaction causing severe rash involving mucus membranes or skin necrosis: No ?Has patient had a PCN reaction that required hospitalization No ?Has   patient had a PCN reaction occurring within the last 10 years: No ?If all of the above answers are "NO", then may proceed with Cephalosporin use. ?  ? ? ?Current Outpatient Medications  ?Medication Sig Dispense Refill  ? Accu-Chek FastClix Lancets MISC 5 (FIVE) TIMES DAILY DIAGNOSIS CODE E11.9 204 each 8  ? atorvastatin (LIPITOR) 80 MG tablet Take 1 tablet (80 mg total) by mouth daily. 30 tablet 11  ? B-D UF III MINI PEN NEEDLES 31G X 5 MM MISC USE TO INJECT NOVOLOG 3 TIMES A DAY. DIAGNOSIS CODE E11.9, Z79.4 100 each 8  ? Blood Glucose Monitoring Suppl (ACCU-CHEK AVIVA PLUS) w/Device KIT Use to test blood glucose  3 times daily 1 kit 0  ? calcium-vitamin D (CALCIUM 500+D HIGH POTENCY) 500-400 MG-UNIT tablet Take 1 tablet by mouth daily. 90 tablet 3  ? cloNIDine (CATAPRES - DOSED IN MG/24 HR) 0.2 mg/24hr patch PLACE 1 PATCH (0.2 MG TOTAL) ONTO THE SKIN ONCE A WEEK. 4 patch 3  ? CVS D3  50 MCG (2000 UT) CAPS TAKE 1 CAPSULE BY MOUTH EVERY DAY 90 capsule 3  ? fluticasone (FLOVENT HFA) 44 MCG/ACT inhaler Inhale 2 puffs into the lungs daily. 10.6 each 3  ? glucose blood (ACCU-CHEK GUIDE) test strip TEST 5 (FIVE) TIMES DAILY DIAGNOSIS CODE E11.9 150 strip 12  ? hydrALAZINE (APRESOLINE) 50 MG tablet TAKE 1 TABLET BY MOUTH TWICE A DAY 60 tablet 3  ? HYDROcodone-acetaminophen (NORCO) 5-325 MG tablet Take 1 tablet by mouth every 6 (six) hours as needed for moderate pain. 10 tablet 0  ? insulin glargine (LANTUS) 100 UNIT/ML injection Inject 0.34 mL into the skin once daily 38 mL 4  ? Insulin Syringe-Needle U-100 31G X 15/64" 0.3 ML MISC USE TO INJECT LANTUS INSULIN TWO TIMES A DAY 180 each 3  ? loratadine (CLARITIN) 10 MG tablet Take 1 tablet (10 mg total) by mouth daily. 30 tablet 2  ? omeprazole (PRILOSEC) 20 MG capsule TAKE 1 CAPSULE BY MOUTH EVERY DAY 90 capsule 2  ? Polyethyl Glycol-Propyl Glycol (SYSTANE OP) Apply 1 drop to eye daily as needed (for dry eyes).    ? verapamil (CALAN-SR) 180 MG CR tablet TAKE 2 TABLETS (360 MG TOTAL) BY MOUTH AT BEDTIME. 60 tablet 3  ? insulin aspart (NOVOLOG FLEXPEN) 100 UNIT/ML FlexPen Inject 7 Units into the skin 3 (three) times daily with meals. 15 mL 2  ? ?No current facility-administered medications for this visit.  ? ? ?Review of Systems  ?Constitutional:  Constitutional negative. ?HENT: HENT negative.  ?Eyes: Eyes negative.  ?Respiratory: Respiratory negative.  ?Cardiovascular: Cardiovascular negative.  ?GI: Gastrointestinal negative.  ?Musculoskeletal:  ?     Exposed left arm avg ?Skin: Positive for wound.  ?Neurological: Neurological negative. ?Hematologic: Hematologic/lymphatic negative.  ?Psychiatric: Psychiatric negative.   ? ?   ?Objective:  ?Objective  ? ?Vitals:  ? 12/05/21 1356  ?BP: 108/60  ?Pulse: 99  ?Resp: 20  ?Temp: 97.8 ?F (36.6 ?C)  ?SpO2: 96%  ?Weight: 96 lb (43.5 kg)  ?Height: 5' 2" (1.575 m)  ? ?Body mass index is 17.56 kg/m?. ? ?Physical  Exam ?HENT:  ?   Head: Normocephalic.  ?   Nose:  ?   Comments: Wearing a mask ?Eyes:  ?   Pupils: Pupils are equal, round, and reactive to light.  ?Cardiovascular:  ?   Pulses:     ?     Radial pulses are 2+ on the right side and 2+ on the left   side.  ?Pulmonary:  ?   Effort: Pulmonary effort is normal.  ?Abdominal:  ?   General: Abdomen is flat.  ?   Palpations: Abdomen is soft.  ?Musculoskeletal:  ?   Comments: Right upper arm strong cephalic vein thrill  ?Skin: ?   Capillary Refill: Capillary refill takes less than 2 seconds.  ?   Comments: Exposed ptfe left upper arm  ?Neurological:  ?   General: No focal deficit present.  ?   Mental Status: She is alert.  ?Psychiatric:     ?   Mood and Affect: Mood normal.     ?   Behavior: Behavior normal.  ? ? ?Data: ?No studies ?    ?Assessment/Plan:  ?  ?65-year-old female here for evaluation exposed left arm PTFE graft.  Plan will be for excision on a nondialysis day in the near future.  She will continue to use the fistula in the right upper extremity for dialysis.- ? ?  ? ?Aryaman Haliburton Christopher Courage Biglow MD ?[facility] ? ? ?

## 2021-12-05 NOTE — Progress Notes (Signed)
? ?Patient ID: Susan Evans, female   DOB: 1956/07/07, 66 y.o.   MRN: [id number] ? ?Reason for Consult: Follow-up ?  ?Referred by Elmarie Shiley, MD ? ?Subjective:  ?   ?HPI: ? ?Susan Evans is a 66 y.o. female with end-stage renal disease currently dialyzing Tuesday, Thursdays and Saturdays via right arm AV fistula.  She has a previous left arm AV graft which has become exposed.  She denies any fevers or chills.  She is now here for further evaluation.  She does not take any blood thinners.  ? ?Past Medical History:  ?Diagnosis Date  ? AKI (acute kidney injury) ([place]) 08/12/2019  ? Chronic kidney disease   ? Dialysis T/Th/Sat- [address]  ? CKD stage G1/A3, GFR > 90 and albumin creatinine ratio >300 mg/g 07/08/2016  ? Nephrotic range proteinuria  ? Diabetes mellitus   ? type 2   ? Dyspnea   ? "when I have too much fluid."  ? Ganglion cyst   ? right wrist  ? GERD (gastroesophageal reflux disease)   ? History of radiation therapy 02/27/10 -04/13/10  ? left base of tongue, bilat neck  ? Hyperlipidemia   ? Hypertension   ? Iron deficiency anemia   ? Malignant neoplasm ([place]) 04/11  ? oropharyngeal carcinoma  ? Menopause   ? Thyroid disease   ? Hyperthyroidism  ? Tobacco abuse   ? PT HAS QUIT  ? ?Family History  ?Problem Relation Age of Onset  ? Stroke Father   ? Cancer Father   ?     unknown ca  ? Hypertension Mother   ? Diabetes Mother   ? Chronic Renal Failure Sister   ? Chronic Renal Failure Brother   ? Cancer Paternal Aunt   ?     colon ca  ? Breast cancer Neg Hx   ? ?Past Surgical History:  ?Procedure Laterality Date  ? AV FISTULA PLACEMENT Left 06/02/2020  ? Procedure: INSERTION OF ARTERIOVENOUS (AV) GORE-TEX GRAFT ARM LEFT;  Surgeon: Marty Heck, MD;  Location: [facility];  Service: Vascular;  Laterality: Left;  ? AV FISTULA PLACEMENT Right 12/27/2020  ? Procedure: RIGHT ARM CEPHALIC ARTERIOVENOUS FISTULA CREATION;  Surgeon: Waynetta Sandy, MD;  Location: [facility];  Service: Vascular;   Laterality: Right;  ? IR FLUORO GUIDE CV LINE LEFT  05/15/2020  ? IR REMOVAL TUN CV CATH W/O FL  05/15/2020  ? IR REMOVAL TUN CV CATH W/O FL  06/15/2021  ? IR US GUIDE VASC ACCESS LEFT  05/15/2020  ? NECK SURGERY    ? REDUCTION MAMMAPLASTY    ? ? ?Short Social History:  ?Social History  ? ?Tobacco Use  ? Smoking status: Former  ?  Packs/day: 0.30  ?  Types: Cigarettes  ?  Quit date: 04/03/2021  ?  Years since quitting: 0.6  ? Smokeless tobacco: Never  ? Tobacco comments:  ?  1 cig per day   ?Substance Use Topics  ? Alcohol use: No  ?  Alcohol/week: 0.0 standard drinks  ? ? ?Allergies  ?Allergen Reactions  ? Hydrochlorothiazide Itching and Other (See Comments)  ?  Hyponatremia ?  ? Penicillins Other (See Comments)  ?  "takes all of her hair out" ?.Has patient had a PCN reaction causing immediate rash, facial/tongue/throat swelling, SOB or lightheadedness with hypotension: No ?Has patient had a PCN reaction causing severe rash involving mucus membranes or skin necrosis: No ?Has patient had a PCN reaction that required hospitalization No ?Has  patient had a PCN reaction occurring within the last 10 years: No ?If all of the above answers are "NO", then may proceed with Cephalosporin use. ?  ? ? ?Current Outpatient Medications  ?Medication Sig Dispense Refill  ? Accu-Chek FastClix Lancets MISC 5 (FIVE) TIMES DAILY DIAGNOSIS CODE E11.9 204 each 8  ? atorvastatin (LIPITOR) 80 MG tablet Take 1 tablet (80 mg total) by mouth daily. 30 tablet 11  ? B-D UF III MINI PEN NEEDLES 31G X 5 MM MISC USE TO INJECT NOVOLOG 3 TIMES A DAY. DIAGNOSIS CODE E11.9, Z79.4 100 each 8  ? Blood Glucose Monitoring Suppl (ACCU-CHEK AVIVA PLUS) w/Device KIT Use to test blood glucose  3 times daily 1 kit 0  ? calcium-vitamin D (CALCIUM 500+D HIGH POTENCY) 500-400 MG-UNIT tablet Take 1 tablet by mouth daily. 90 tablet 3  ? cloNIDine (CATAPRES - DOSED IN MG/24 HR) 0.2 mg/24hr patch PLACE 1 PATCH (0.2 MG TOTAL) ONTO THE SKIN ONCE A WEEK. 4 patch 3  ? CVS D3  50 MCG (2000 UT) CAPS TAKE 1 CAPSULE BY MOUTH EVERY DAY 90 capsule 3  ? fluticasone (FLOVENT HFA) 44 MCG/ACT inhaler Inhale 2 puffs into the lungs daily. 10.6 each 3  ? glucose blood (ACCU-CHEK GUIDE) test strip TEST 5 (FIVE) TIMES DAILY DIAGNOSIS CODE E11.9 150 strip 12  ? hydrALAZINE (APRESOLINE) 50 MG tablet TAKE 1 TABLET BY MOUTH TWICE A DAY 60 tablet 3  ? HYDROcodone-acetaminophen (NORCO) 5-325 MG tablet Take 1 tablet by mouth every 6 (six) hours as needed for moderate pain. 10 tablet 0  ? insulin glargine (LANTUS) 100 UNIT/ML injection Inject 0.34 mL into the skin once daily 38 mL 4  ? Insulin Syringe-Needle U-100 31G X 15/64" 0.3 ML MISC USE TO INJECT LANTUS INSULIN TWO TIMES A DAY 180 each 3  ? loratadine (CLARITIN) 10 MG tablet Take 1 tablet (10 mg total) by mouth daily. 30 tablet 2  ? omeprazole (PRILOSEC) 20 MG capsule TAKE 1 CAPSULE BY MOUTH EVERY DAY 90 capsule 2  ? Polyethyl Glycol-Propyl Glycol (SYSTANE OP) Apply 1 drop to eye daily as needed (for dry eyes).    ? verapamil (CALAN-SR) 180 MG CR tablet TAKE 2 TABLETS (360 MG TOTAL) BY MOUTH AT BEDTIME. 60 tablet 3  ? insulin aspart (NOVOLOG FLEXPEN) 100 UNIT/ML FlexPen Inject 7 Units into the skin 3 (three) times daily with meals. 15 mL 2  ? ?No current facility-administered medications for this visit.  ? ? ?Review of Systems  ?Constitutional:  Constitutional negative. ?HENT: HENT negative.  ?Eyes: Eyes negative.  ?Respiratory: Respiratory negative.  ?Cardiovascular: Cardiovascular negative.  ?GI: Gastrointestinal negative.  ?Musculoskeletal:  ?     Exposed left arm avg ?Skin: Positive for wound.  ?Neurological: Neurological negative. ?Hematologic: Hematologic/lymphatic negative.  ?Psychiatric: Psychiatric negative.   ? ?   ?Objective:  ?Objective  ? ?Vitals:  ? 12/05/21 1356  ?BP: 108/60  ?Pulse: 99  ?Resp: 20  ?Temp: 97.8 ?F (36.6 ?C)  ?SpO2: 96%  ?Weight: 96 lb (43.5 kg)  ?Height: 5' 2" (1.575 m)  ? ?Body mass index is 17.56 kg/m?. ? ?Physical  Exam ?HENT:  ?   Head: Normocephalic.  ?   Nose:  ?   Comments: Wearing a mask ?Eyes:  ?   Pupils: Pupils are equal, round, and reactive to light.  ?Cardiovascular:  ?   Pulses:     ?     Radial pulses are 2+ on the right side and 2+ on the left  side.  ?Pulmonary:  ?   Effort: Pulmonary effort is normal.  ?Abdominal:  ?   General: Abdomen is flat.  ?   Palpations: Abdomen is soft.  ?Musculoskeletal:  ?   Comments: Right upper arm strong cephalic vein thrill  ?Skin: ?   Capillary Refill: Capillary refill takes less than 2 seconds.  ?   Comments: Exposed ptfe left upper arm  ?Neurological:  ?   General: No focal deficit present.  ?   Mental Status: She is alert.  ?Psychiatric:     ?   Mood and Affect: Mood normal.     ?   Behavior: Behavior normal.  ? ? ?Data: ?No studies ?    ?Assessment/Plan:  ?  ?66 year old female here for evaluation exposed left arm PTFE graft.  Plan will be for excision on a nondialysis day in the near future.  She will continue to use the fistula in the right upper extremity for dialysis.- ? ?  ? ?Waynetta Sandy MD ?[facility] ? ? ?

## 2021-12-13 ENCOUNTER — Other Ambulatory Visit: Payer: Self-pay

## 2021-12-19 ENCOUNTER — Encounter (HOSPITAL_COMMUNITY): Payer: Self-pay | Admitting: Vascular Surgery

## 2021-12-19 ENCOUNTER — Other Ambulatory Visit: Payer: Self-pay

## 2021-12-19 NOTE — Progress Notes (Signed)
Patient voiced understanding of arrival time of 1110 on Friday 12/21/2021, hygiene instructions, NPO instructions, and medication instructions.  ? ?Patient denies any pain, shortness of breath, or covid/flu symptoms.  ?Patient has dialysis tuesdays, thursdays, and saturdays at [facility]. ? ?Patient lives with her boyfriend.  ?Her sister Kennyth Lose [phone] will be bringing her for surgery and will be able to stay with her for 24 hours after surgery.   ? ?PCP: Dr. Dorene Ar ? ?Cardiologist: n/a ?

## 2021-12-21 ENCOUNTER — Inpatient Hospital Stay (HOSPITAL_COMMUNITY)
Admission: RE | Admit: 2021-12-21 | Discharge: 2021-12-24 | DRG: 907 | Disposition: A | Discharge status: Home / Self Care | Payer: Medicare Other | Attending: Pulmonary Disease | Admitting: Pulmonary Disease

## 2021-12-21 ENCOUNTER — Other Ambulatory Visit: Payer: Self-pay

## 2021-12-21 ENCOUNTER — Encounter (HOSPITAL_COMMUNITY): Payer: Self-pay | Admitting: Vascular Surgery

## 2021-12-21 ENCOUNTER — Ambulatory Visit (HOSPITAL_COMMUNITY): Payer: Medicare Other | Admitting: Physician Assistant

## 2021-12-21 ENCOUNTER — Inpatient Hospital Stay (HOSPITAL_COMMUNITY): Payer: Medicare Other

## 2021-12-21 ENCOUNTER — Encounter (HOSPITAL_COMMUNITY)
Admission: RE | Disposition: A | Discharge status: Home / Self Care | Payer: Self-pay | Source: Home / Self Care | Attending: Pulmonary Disease

## 2021-12-21 DIAGNOSIS — T82868A Thrombosis of vascular prosthetic devices, implants and grafts, initial encounter: Secondary | ICD-10-CM | POA: Diagnosis present

## 2021-12-21 DIAGNOSIS — Z923 Personal history of irradiation: Secondary | ICD-10-CM

## 2021-12-21 DIAGNOSIS — Y832 Surgical operation with anastomosis, bypass or graft as the cause of abnormal reaction of the patient, or of later complication, without mention of misadventure at the time of the procedure: Secondary | ICD-10-CM | POA: Diagnosis present

## 2021-12-21 DIAGNOSIS — T82590A Other mechanical complication of surgically created arteriovenous fistula, initial encounter: Secondary | ICD-10-CM | POA: Diagnosis present

## 2021-12-21 DIAGNOSIS — R54 Age-related physical debility: Secondary | ICD-10-CM | POA: Diagnosis present

## 2021-12-21 DIAGNOSIS — Z8521 Personal history of malignant neoplasm of larynx: Secondary | ICD-10-CM

## 2021-12-21 DIAGNOSIS — D649 Anemia, unspecified: Secondary | ICD-10-CM | POA: Diagnosis present

## 2021-12-21 DIAGNOSIS — T884XXA Failed or difficult intubation, initial encounter: Principal | ICD-10-CM

## 2021-12-21 DIAGNOSIS — Z992 Dependence on renal dialysis: Secondary | ICD-10-CM

## 2021-12-21 DIAGNOSIS — Z7984 Long term (current) use of oral hypoglycemic drugs: Secondary | ICD-10-CM | POA: Diagnosis not present

## 2021-12-21 DIAGNOSIS — Z781 Physical restraint status: Secondary | ICD-10-CM | POA: Diagnosis not present

## 2021-12-21 DIAGNOSIS — T82898A Other specified complication of vascular prosthetic devices, implants and grafts, initial encounter: Secondary | ICD-10-CM

## 2021-12-21 DIAGNOSIS — Z978 Presence of other specified devices: Secondary | ICD-10-CM | POA: Diagnosis not present

## 2021-12-21 DIAGNOSIS — J384 Edema of larynx: Secondary | ICD-10-CM | POA: Diagnosis present

## 2021-12-21 DIAGNOSIS — T8579XA Infection and inflammatory reaction due to other internal prosthetic devices, implants and grafts, initial encounter: Secondary | ICD-10-CM | POA: Diagnosis present

## 2021-12-21 DIAGNOSIS — Z841 Family history of disorders of kidney and ureter: Secondary | ICD-10-CM

## 2021-12-21 DIAGNOSIS — Z87891 Personal history of nicotine dependence: Secondary | ICD-10-CM | POA: Diagnosis not present

## 2021-12-21 DIAGNOSIS — E0590 Thyrotoxicosis, unspecified without thyrotoxic crisis or storm: Secondary | ICD-10-CM | POA: Diagnosis present

## 2021-12-21 DIAGNOSIS — Z800 Family history of malignant neoplasm of digestive organs: Secondary | ICD-10-CM

## 2021-12-21 DIAGNOSIS — E1122 Type 2 diabetes mellitus with diabetic chronic kidney disease: Secondary | ICD-10-CM | POA: Diagnosis present

## 2021-12-21 DIAGNOSIS — E785 Hyperlipidemia, unspecified: Secondary | ICD-10-CM | POA: Diagnosis present

## 2021-12-21 DIAGNOSIS — I120 Hypertensive chronic kidney disease with stage 5 chronic kidney disease or end stage renal disease: Secondary | ICD-10-CM

## 2021-12-21 DIAGNOSIS — N186 End stage renal disease: Secondary | ICD-10-CM

## 2021-12-21 DIAGNOSIS — Z794 Long term (current) use of insulin: Secondary | ICD-10-CM

## 2021-12-21 DIAGNOSIS — E876 Hypokalemia: Secondary | ICD-10-CM

## 2021-12-21 DIAGNOSIS — Z880 Allergy status to penicillin: Secondary | ICD-10-CM

## 2021-12-21 DIAGNOSIS — N2581 Secondary hyperparathyroidism of renal origin: Secondary | ICD-10-CM | POA: Diagnosis present

## 2021-12-21 DIAGNOSIS — Z8249 Family history of ischemic heart disease and other diseases of the circulatory system: Secondary | ICD-10-CM

## 2021-12-21 DIAGNOSIS — Z888 Allergy status to other drugs, medicaments and biological substances status: Secondary | ICD-10-CM

## 2021-12-21 DIAGNOSIS — M898X9 Other specified disorders of bone, unspecified site: Secondary | ICD-10-CM | POA: Diagnosis present

## 2021-12-21 DIAGNOSIS — Z823 Family history of stroke: Secondary | ICD-10-CM

## 2021-12-21 DIAGNOSIS — Z833 Family history of diabetes mellitus: Secondary | ICD-10-CM

## 2021-12-21 DIAGNOSIS — Z9221 Personal history of antineoplastic chemotherapy: Secondary | ICD-10-CM

## 2021-12-21 DIAGNOSIS — K219 Gastro-esophageal reflux disease without esophagitis: Secondary | ICD-10-CM | POA: Diagnosis present

## 2021-12-21 DIAGNOSIS — M6281 Muscle weakness (generalized): Secondary | ICD-10-CM | POA: Diagnosis present

## 2021-12-21 DIAGNOSIS — J9601 Acute respiratory failure with hypoxia: Secondary | ICD-10-CM | POA: Diagnosis present

## 2021-12-21 DIAGNOSIS — T884XXD Failed or difficult intubation, subsequent encounter: Secondary | ICD-10-CM | POA: Diagnosis not present

## 2021-12-21 DIAGNOSIS — J9600 Acute respiratory failure, unspecified whether with hypoxia or hypercapnia: Secondary | ICD-10-CM | POA: Diagnosis present

## 2021-12-21 HISTORY — PX: AVGG REMOVAL: SHX5153

## 2021-12-21 HISTORY — DX: Infection and inflammatory reaction due to other internal prosthetic devices, implants and grafts, initial encounter: T85.79XA

## 2021-12-21 LAB — CBC
HCT: 33.4 % — ABNORMAL LOW (ref 36.0–46.0)
Hemoglobin: 10.6 g/dL — ABNORMAL LOW (ref 12.0–15.0)
MCH: 24.5 pg — ABNORMAL LOW (ref 26.0–34.0)
MCHC: 31.7 g/dL (ref 30.0–36.0)
MCV: 77.3 fL — ABNORMAL LOW (ref 80.0–100.0)
Platelets: 202 10*3/uL (ref 150–400)
RBC: 4.32 MIL/uL (ref 3.87–5.11)
RDW: 15.3 % (ref 11.5–15.5)
WBC: 8.9 10*3/uL (ref 4.0–10.5)
nRBC: 0 % (ref 0.0–0.2)

## 2021-12-21 LAB — POCT I-STAT, CHEM 8
BUN: 29 mg/dL — ABNORMAL HIGH (ref 8–23)
Calcium, Ion: 1.05 mmol/L — ABNORMAL LOW (ref 1.15–1.40)
Chloride: 93 mmol/L — ABNORMAL LOW (ref 98–111)
Creatinine, Ser: 4.7 mg/dL — ABNORMAL HIGH (ref 0.44–1.00)
Glucose, Bld: 197 mg/dL — ABNORMAL HIGH (ref 70–99)
HCT: 40 % (ref 36.0–46.0)
Hemoglobin: 13.6 g/dL (ref 12.0–15.0)
Potassium: 3.4 mmol/L — ABNORMAL LOW (ref 3.5–5.1)
Sodium: 133 mmol/L — ABNORMAL LOW (ref 135–145)
TCO2: 28 mmol/L (ref 22–32)

## 2021-12-21 LAB — POCT I-STAT 7, (LYTES, BLD GAS, ICA,H+H)
Acid-Base Excess: 2 mmol/L (ref 0.0–2.0)
Bicarbonate: 25.6 mmol/L (ref 20.0–28.0)
Calcium, Ion: 1.03 mmol/L — ABNORMAL LOW (ref 1.15–1.40)
HCT: 34 % — ABNORMAL LOW (ref 36.0–46.0)
Hemoglobin: 11.6 g/dL — ABNORMAL LOW (ref 12.0–15.0)
O2 Saturation: 100 %
Patient temperature: 98.6
Potassium: 3.4 mmol/L — ABNORMAL LOW (ref 3.5–5.1)
Sodium: 132 mmol/L — ABNORMAL LOW (ref 135–145)
TCO2: 27 mmol/L (ref 22–32)
pCO2 arterial: 33.8 mmHg (ref 32–48)
pH, Arterial: 7.487 — ABNORMAL HIGH (ref 7.35–7.45)
pO2, Arterial: 179 mmHg — ABNORMAL HIGH (ref 83–108)

## 2021-12-21 LAB — BASIC METABOLIC PANEL
Anion gap: 17 — ABNORMAL HIGH (ref 5–15)
BUN: 28 mg/dL — ABNORMAL HIGH (ref 8–23)
CO2: 19 mmol/L — ABNORMAL LOW (ref 22–32)
Calcium: 8.5 mg/dL — ABNORMAL LOW (ref 8.9–10.3)
Chloride: 96 mmol/L — ABNORMAL LOW (ref 98–111)
Creatinine, Ser: 4.6 mg/dL — ABNORMAL HIGH (ref 0.44–1.00)
GFR, Estimated: 10 mL/min — ABNORMAL LOW (ref >60)
Glucose, Bld: 168 mg/dL — ABNORMAL HIGH (ref 70–99)
Potassium: 5.4 mmol/L — ABNORMAL HIGH (ref 3.5–5.1)
Sodium: 132 mmol/L — ABNORMAL LOW (ref 135–145)

## 2021-12-21 LAB — GLUCOSE, CAPILLARY
Glucose-Capillary: 149 mg/dL — ABNORMAL HIGH (ref 70–99)
Glucose-Capillary: 153 mg/dL — ABNORMAL HIGH (ref 70–99)
Glucose-Capillary: 170 mg/dL — ABNORMAL HIGH (ref 70–99)
Glucose-Capillary: 185 mg/dL — ABNORMAL HIGH (ref 70–99)
Glucose-Capillary: 186 mg/dL — ABNORMAL HIGH (ref 70–99)
Glucose-Capillary: 192 mg/dL — ABNORMAL HIGH (ref 70–99)

## 2021-12-21 LAB — HIV ANTIBODY (ROUTINE TESTING W REFLEX): HIV Screen 4th Generation wRfx: NONREACTIVE

## 2021-12-21 LAB — HEMOGLOBIN A1C
Hgb A1c MFr Bld: 6.8 % — ABNORMAL HIGH (ref 4.8–5.6)
Mean Plasma Glucose: 148.46 mg/dL

## 2021-12-21 SURGERY — REMOVAL OF ARTERIOVENOUS GORETEX GRAFT (AVGG)
Anesthesia: General | Laterality: Left

## 2021-12-21 MED ORDER — 0.9 % SODIUM CHLORIDE (POUR BTL) OPTIME
TOPICAL | Status: DC | PRN
  Administered 2021-12-21: 1000 mL

## 2021-12-21 MED ORDER — CHLORHEXIDINE GLUCONATE 0.12% ORAL RINSE (MEDLINE KIT)
15.0000 mL | Freq: Two times a day (BID) | OROMUCOSAL | Status: DC
  Administered 2021-12-21 – 2021-12-24 (×5): 15 mL via OROMUCOSAL

## 2021-12-21 MED ORDER — DOCUSATE SODIUM 100 MG PO CAPS: 100.0000 mg | ORAL_CAPSULE | Freq: Two times a day (BID) | ORAL | Status: DC

## 2021-12-21 MED ORDER — ORAL CARE MOUTH RINSE
15.0000 mL | OROMUCOSAL | Status: DC
  Administered 2021-12-21 – 2021-12-23 (×17): 15 mL via OROMUCOSAL

## 2021-12-21 MED ORDER — FENTANYL CITRATE (PF) 250 MCG/5ML IJ SOLN
INTRAMUSCULAR | Status: AC
  Filled 2021-12-21: qty 5

## 2021-12-21 MED ORDER — FENTANYL CITRATE (PF) 250 MCG/5ML IJ SOLN
INTRAMUSCULAR | Status: DC | PRN
  Administered 2021-12-21: 50 ug via INTRAVENOUS

## 2021-12-21 MED ORDER — PROPOFOL 10 MG/ML IV BOLUS
INTRAVENOUS | Status: DC | PRN
  Administered 2021-12-21: 30 mg via INTRAVENOUS
  Administered 2021-12-21: 50 mg via INTRAVENOUS

## 2021-12-21 MED ORDER — VANCOMYCIN HCL 750 MG/150ML IV SOLN
750.0000 mg | INTRAVENOUS | Status: AC
  Administered 2021-12-21: 750 mg via INTRAVENOUS
  Filled 2021-12-21 (×2): qty 150

## 2021-12-21 MED ORDER — INSULIN ASPART 100 UNIT/ML IJ SOLN
0.0000 [IU] | INTRAMUSCULAR | Status: DC | PRN
  Filled 2021-12-21: qty 1

## 2021-12-21 MED ORDER — CHLORHEXIDINE GLUCONATE CLOTH 2 % EX PADS
6.0000 | MEDICATED_PAD | CUTANEOUS | Status: DC
  Administered 2021-12-22: 6 via TOPICAL

## 2021-12-21 MED ORDER — FENTANYL CITRATE PF 50 MCG/ML IJ SOSY
25.0000 ug | PREFILLED_SYRINGE | Freq: Once | INTRAMUSCULAR | Status: AC
  Administered 2021-12-21: 25 ug via INTRAVENOUS
  Filled 2021-12-21: qty 1

## 2021-12-21 MED ORDER — VANCOMYCIN HCL IN DEXTROSE 1-5 GM/200ML-% IV SOLN
INTRAVENOUS | Status: AC
  Filled 2021-12-21: qty 200

## 2021-12-21 MED ORDER — FENTANYL BOLUS VIA INFUSION
25.0000 ug | INTRAVENOUS | Status: DC | PRN
  Administered 2021-12-22 (×4): 50 ug via INTRAVENOUS
  Filled 2021-12-21: qty 100

## 2021-12-21 MED ORDER — DOCUSATE SODIUM 50 MG/5ML PO LIQD: 100.0000 mg | Freq: Two times a day (BID) | ORAL | Status: DC

## 2021-12-21 MED ORDER — CHLORHEXIDINE GLUCONATE 0.12 % MT SOLN
OROMUCOSAL | Status: AC
  Administered 2021-12-21: 15 mL
  Filled 2021-12-21: qty 15

## 2021-12-21 MED ORDER — PROPOFOL 1000 MG/100ML IV EMUL
5.0000 ug/kg/min | INTRAVENOUS | Status: DC
  Administered 2021-12-21: 55 ug/kg/min via INTRAVENOUS
  Administered 2021-12-21: 50 ug/kg/min via INTRAVENOUS
  Administered 2021-12-22: 30 ug/kg/min via INTRAVENOUS
  Administered 2021-12-22 – 2021-12-23 (×3): 40 ug/kg/min via INTRAVENOUS
  Filled 2021-12-21 (×6): qty 100

## 2021-12-21 MED ORDER — DOCUSATE SODIUM 100 MG PO CAPS: 100.0000 mg | ORAL_CAPSULE | Freq: Two times a day (BID) | ORAL | Status: DC | PRN

## 2021-12-21 MED ORDER — SODIUM CHLORIDE 0.9 % IV SOLN: INTRAVENOUS | Status: DC

## 2021-12-21 MED ORDER — CHLORHEXIDINE GLUCONATE 4 % EX LIQD
60.0000 mL | Freq: Once | CUTANEOUS | Status: DC
Start: 2021-12-21 — End: 2021-12-21

## 2021-12-21 MED ORDER — POLYETHYLENE GLYCOL 3350 17 G PO PACK
17.0000 g | PACK | Freq: Every day | ORAL | Status: DC
  Filled 2021-12-21: qty 1

## 2021-12-21 MED ORDER — LACTATED RINGERS IV SOLN: INTRAVENOUS | Status: DC

## 2021-12-21 MED ORDER — INSULIN ASPART 100 UNIT/ML IJ SOLN
0.0000 [IU] | INTRAMUSCULAR | Status: DC
  Administered 2021-12-21 (×3): 2 [IU] via SUBCUTANEOUS
  Administered 2021-12-22 – 2021-12-23 (×2): 1 [IU] via SUBCUTANEOUS
  Administered 2021-12-23: 3 [IU] via SUBCUTANEOUS
  Administered 2021-12-23 (×2): 1 [IU] via SUBCUTANEOUS
  Administered 2021-12-24 (×2): 2 [IU] via SUBCUTANEOUS

## 2021-12-21 MED ORDER — ONDANSETRON HCL 4 MG/2ML IJ SOLN
4.0000 mg | Freq: Four times a day (QID) | INTRAMUSCULAR | Status: DC | PRN
  Administered 2021-12-24: 4 mg via INTRAVENOUS
  Filled 2021-12-21: qty 2

## 2021-12-21 MED ORDER — FENTANYL CITRATE PF 50 MCG/ML IJ SOSY: 25.0000 ug | PREFILLED_SYRINGE | INTRAMUSCULAR | Status: DC | PRN

## 2021-12-21 MED ORDER — PROPOFOL 10 MG/ML IV BOLUS
INTRAVENOUS | Status: AC
  Filled 2021-12-21: qty 20

## 2021-12-21 MED ORDER — CHLORHEXIDINE GLUCONATE CLOTH 2 % EX PADS
6.0000 | MEDICATED_PAD | Freq: Every day | CUTANEOUS | Status: DC
  Administered 2021-12-21: 6 via TOPICAL

## 2021-12-21 MED ORDER — HEPARIN 6000 UNIT IRRIGATION SOLUTION
Status: AC
  Filled 2021-12-21: qty 500

## 2021-12-21 MED ORDER — CHLORHEXIDINE GLUCONATE 4 % EX LIQD: 60.0000 mL | Freq: Once | CUTANEOUS | Status: DC

## 2021-12-21 MED ORDER — HEPARIN 6000 UNIT IRRIGATION SOLUTION
Status: DC | PRN
  Administered 2021-12-21: 1

## 2021-12-21 MED ORDER — DEXAMETHASONE SODIUM PHOSPHATE 4 MG/ML IJ SOLN
4.0000 mg | Freq: Four times a day (QID) | INTRAMUSCULAR | Status: AC
  Administered 2021-12-21 – 2021-12-23 (×8): 4 mg via INTRAVENOUS
  Filled 2021-12-21 (×8): qty 1

## 2021-12-21 MED ORDER — SUCCINYLCHOLINE CHLORIDE 200 MG/10ML IV SOSY
PREFILLED_SYRINGE | INTRAVENOUS | Status: DC | PRN
  Administered 2021-12-21: 60 mg via INTRAVENOUS

## 2021-12-21 MED ORDER — ACETAMINOPHEN 325 MG PO TABS
650.0000 mg | ORAL_TABLET | ORAL | Status: DC | PRN
  Administered 2021-12-24: 650 mg via ORAL
  Filled 2021-12-21: qty 2

## 2021-12-21 MED ORDER — FENTANYL 2500MCG IN NS 250ML (10MCG/ML) PREMIX INFUSION
25.0000 ug/h | INTRAVENOUS | Status: DC
  Administered 2021-12-21: 25 ug/h via INTRAVENOUS
  Filled 2021-12-21: qty 250

## 2021-12-21 MED ORDER — POLYETHYLENE GLYCOL 3350 17 G PO PACK: 17.0000 g | PACK | Freq: Every day | ORAL | Status: DC | PRN

## 2021-12-21 MED ORDER — DEXAMETHASONE SODIUM PHOSPHATE 10 MG/ML IJ SOLN
INTRAMUSCULAR | Status: DC | PRN
  Administered 2021-12-21: 10 mg via INTRAVENOUS

## 2021-12-21 MED ORDER — LIDOCAINE 2% (20 MG/ML) 5 ML SYRINGE
INTRAMUSCULAR | Status: DC | PRN
  Administered 2021-12-21: 40 mg via INTRAVENOUS

## 2021-12-21 MED ORDER — ONDANSETRON HCL 4 MG/2ML IJ SOLN
INTRAMUSCULAR | Status: DC | PRN
  Administered 2021-12-21: 4 mg via INTRAVENOUS

## 2021-12-21 MED ORDER — FAMOTIDINE IN NACL 20-0.9 MG/50ML-% IV SOLN
20.0000 mg | Freq: Two times a day (BID) | INTRAVENOUS | Status: DC
  Administered 2021-12-21: 20 mg via INTRAVENOUS
  Filled 2021-12-21: qty 50

## 2021-12-21 SURGICAL SUPPLY — 30 items
ADH SKN CLS APL DERMABOND .7 (GAUZE/BANDAGES/DRESSINGS) ×1
ARMBAND PINK RESTRICT EXTREMIT (MISCELLANEOUS) ×4 IMPLANT
BAG COUNTER SPONGE SURGICOUNT (BAG) ×2 IMPLANT
BAG SPNG CNTER NS LX DISP (BAG) ×1
BNDG ELASTIC 4X5.8 VLCR STR LF (GAUZE/BANDAGES/DRESSINGS) ×1 IMPLANT
CANISTER SUCT 3000ML PPV (MISCELLANEOUS) ×2 IMPLANT
CLIP LIGATING EXTRA MED SLVR (CLIP) ×2 IMPLANT
CLIP LIGATING EXTRA SM BLUE (MISCELLANEOUS) ×2 IMPLANT
DERMABOND ADVANCED (GAUZE/BANDAGES/DRESSINGS) ×1
DERMABOND ADVANCED .7 DNX12 (GAUZE/BANDAGES/DRESSINGS) ×1 IMPLANT
ELECT REM PT RETURN 9FT ADLT (ELECTROSURGICAL) ×2
ELECTRODE REM PT RTRN 9FT ADLT (ELECTROSURGICAL) ×1 IMPLANT
GAUZE SPONGE 4X4 12PLY STRL LF (GAUZE/BANDAGES/DRESSINGS) ×1 IMPLANT
GLOVE SURG ENC MOIS LTX SZ7.5 (GLOVE) ×2 IMPLANT
GOWN STRL REUS W/ TWL LRG LVL3 (GOWN DISPOSABLE) ×2 IMPLANT
GOWN STRL REUS W/ TWL XL LVL3 (GOWN DISPOSABLE) ×1 IMPLANT
GOWN STRL REUS W/TWL LRG LVL3 (GOWN DISPOSABLE) ×4
GOWN STRL REUS W/TWL XL LVL3 (GOWN DISPOSABLE) ×2
KIT BASIN OR (CUSTOM PROCEDURE TRAY) ×2 IMPLANT
KIT TURNOVER KIT B (KITS) ×2 IMPLANT
NS IRRIG 1000ML POUR BTL (IV SOLUTION) ×2 IMPLANT
PACK CV ACCESS (CUSTOM PROCEDURE TRAY) ×2 IMPLANT
PAD ARMBOARD 7.5X6 YLW CONV (MISCELLANEOUS) ×4 IMPLANT
SUT MNCRL AB 4-0 PS2 18 (SUTURE) IMPLANT
SUT PROLENE 6 0 BV (SUTURE) ×4 IMPLANT
SUT VIC AB 3-0 SH 27 (SUTURE) ×4
SUT VIC AB 3-0 SH 27X BRD (SUTURE) ×2 IMPLANT
TOWEL GREEN STERILE (TOWEL DISPOSABLE) ×2 IMPLANT
UNDERPAD 30X36 HEAVY ABSORB (UNDERPADS AND DIAPERS) ×2 IMPLANT
WATER STERILE IRR 1000ML POUR (IV SOLUTION) ×2 IMPLANT

## 2021-12-21 NOTE — Plan of Care (Signed)
?  Problem: Safety: ?Goal: Non-violent Restraint(s) ?Outcome: Progressing ?Restraints on for safety d/t extremely difficult airway and concern for accidental extubation. Following protocol, no complications  ? ?Problem: Clinical Measurements: ?Goal: Will remain free from infection ?Outcome: Progressing ?Goal: Respiratory complications will improve ?Outcome: Progressing ?Goal: Cardiovascular complication will be avoided ?Outcome: Progressing ?  ?Problem: Safety: ?Goal: Ability to remain free from injury will improve ?Outcome: Progressing ?  ?

## 2021-12-21 NOTE — Anesthesia Postprocedure Evaluation (Signed)
Anesthesia Post Note ? ?Patient: Susan Evans ? ?Procedure(s) Performed: EXCISION OF LEFT ARM ARTERIOVENOUS GORETEX GRAFT ([place]) (Left) ? ?  ? ?Patient location during evaluation: ICU ?Anesthesia Type: General ?Level of consciousness: sedated ?Pain management: pain level controlled ?Vital Signs Assessment: post-procedure vital signs reviewed and stable ?Respiratory status: patient on ventilator - see flowsheet for VS and respiratory function stable ?Cardiovascular status: stable ?Anesthetic complications: yes ?Comments: Difficult airway encountered with multiple attempts and friable tissue due to radiation changes. Patient given dexamethasone and left intubated and sedated for delayed extubation. Report given to ICU provider by telephone. Daiva Huge, MD ? ? ?Encounter Notable Events  ?Notable Event Outcome Phase Comment  ?Difficult to intubate - unexpected  Intraprocedure   ? ? ?Last Vitals:  ?Vitals:  ? 12/21/21 1117 12/21/21 1615  ?BP: (!) 169/68   ?Pulse: 96 72  ?Resp: 18 16  ?Temp: (!) 36.4 ?C   ?SpO2: 96% 100%  ?  ?Last Pain:  ?Vitals:  ? 12/21/21 1152  ?TempSrc:   ?PainSc: 0-No pain  ? ? ?  ?  ?  ?  ?  ?  ? ?Susan Evans ? ? ? ? ?

## 2021-12-21 NOTE — Discharge Instructions (Signed)
?  December 21, 2021 ? ?[name] ?[address] ?[place] [address] ? ? ?Dear [name],  ? ?I hope you are doing well after your surgery. During your recent procedure at [facility], your anesthesia team determined that you were a difficult intubation.  This means that there was difficulty in placing a breathing tube from your mouth into your windpipe.  This procedure is commonly performed for general anesthetics for the purpose of providing oxygen and anesthetic gases during the operation.  Routinely, this is done using an instrument, known as a laryngoscope, to visualize the vocal cords and directly place the breathing tube into the trachea. We had difficulty placing a laryngeal mask airway ("LMA") of multiple sizes as well as a breathing tube, despite the use of a videolaryngoscope. This is likely due to your history of oral cancer and radiation therapy. Ultimately a smaller endotracheal tube (size 6.5) was placed in your airway, but in the future it will be helpful for your Anesthesiologist to know that this was with significant difficulty and a view of your vocal cords was not obtained due to swollen and friable tissues. It is very important for you to tell your Anesthesiologist when you have future operations, that you are a difficult intubation so that other arrangements, personnel, or instruments can be obtained to ensure your safety.  ? ?We recommend that you obtain and wear a Medical Alert Bracelet in case an emergency arises.  There are many companies who will sell you such a bracelet in many styles.  They are easily found on the internet by searching "Medical Alert Bracelet."  Yours should have the words "Difficult Intubation" written on the bracelet.  ? ?It was a pleasure to take care of you for your surgery, and I hope the best for you in the future. Should you have any questions, please feel free to contact me at [phone].  ? ?Marthenia Rolling, MD,  MPH ?Anesthesiology ? ? ? ? ? ? ? ? ? ? ? ?

## 2021-12-21 NOTE — Progress Notes (Addendum)
CCM ground MD at bedside. Discussed pt's airway - hx of oropharyngeal cancer with radiation and complicated/difficult intubation today in OR. Plan to keep pt intubated, receiving decadron, keeping well sedated and mitts/restraints in use for pt safety/prevent accidental self extubation.  ? ?Rhino trach insertion kit, trach and supplies placed at bedside.  ? ?Not placing OGT, holding per tube meds, d/t risk of inserting OGT/NGT.  ?

## 2021-12-21 NOTE — H&P (Addendum)
? ?NAME:  Susan Evans, MRN:  [id number], DOB:  [birth year], LOS: 0 ?ADMISSION DATE:  12/21/2021, CONSULTATION DATE:  12/21/21 ?REFERRING MD:  Donzetta Matters - [facility], CHIEF COMPLAINT: difficult airway /  subglottic stenosis  ? 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 At start of case, unable to place LMA x 2,  so case converted to general. Difficulty visualizing airway, required glide for ultimate placement of 6.5 ETT. Decadron given for degree of edema, and noted to have friable appearing tissue.  ? ?PCCM is consulted periop for admission and management in this setting.  ? ?Pertinent  Medical History  ?ESRD ?Oropharyngeal carcinoma ?HTN ?DM2 ? ?Significant Hospital Events: ?Including procedures, antibiotic start and stop dates in addition to other pertinent events   ?3/31 OR for excision of infected LUE AVG (outpatient surgery). Unforseen difficult airway, PCCM requested to admit ? ?Interim History / Subjective:  ?Arrives to [facility] intubated after case  ? ?Objective   ?Blood pressure (!) 169/68, pulse 96, temperature (!) 97.5 ?F (36.4 ?C), temperature source Oral, resp. rate 18, height '5\' 2"'$  (1.575 m), weight 42.2 kg, SpO2 96 %. ?   ?   ? ?Intake/Output Summary (Last 24 hours) at 12/21/2021 1559 ?Last data filed at 12/21/2021 1547 ?Gross per 24 hour  ?Intake 400 ml  ?Output 5 ml  ?Net 395 ml  ? ?Filed Weights  ? 12/19/21 1629 12/21/21 1117  ?Weight: 42.2 kg 42.2 kg  ? ? ?Examination: ?General: Critically ill appearing older adult F intubated sedated  NAD ?HENT: [place]. 6.5 ETT taped. Anicteric sclera ?Lungs: Scattered upper lobe rhonchi. Blood tinged respiratory secretions. Mechanically ventilated  ?Cardiovascular: rrr s1s2 cap refill < 3 sec  ?Abdomen: thin ndnt + bowel sounds ?Extremities: no acute joint deformity. LUE AVG surgical site wrapped. BUE wrist restraints  ?Neuro: awake  following commands  ?GU: wnl ? ?Resolved Hospital Problem list   ? ? ?Assessment & Plan:  ? ?Post operative ventilator management / ETT present ?Subglottic edema / Difficult airway ?Hx oropharyngeal carcinoma  ?-unable to place LMA, multiple intubation attempts, 6.5 placed with glide. Very stiff neck. Tissue noted to be friable and edematous  ?P ?-With difficult airway and +edema noted by anesthesia, seems prudent to consult ENT for extubation planning.   ?-CXR ABG in ICU ?-PAD with deeper goal than usual given airway concerns -- -2 to -3 w/ prop + PRN fent  ?-? If there might be utility for CT soft tissue  ?-decadron  ?-for now will defer OGT placement, trying to avoid irritation to tissues. Can convert home meds to IV as needed ?-BUE wrist restraints  ? ?ESRD ?Exposed LUE AVG (POD0 excision 3/31) ?-there was concern for infected graft but note that Op note w/ healthy tissue and was not infected appearing.  ?P ?-post op per [facility]  ?-Hd per nephro  ?-will order BMP ? ?HTN ?-hold home meds while hemodynamics equilibrate with sedation  ? ?DM2 ?-SSI  ? ?Best Practice (right click and "Reselect all SmartList Selections" daily)  ? ?Diet/type: NPO ?DVT prophylaxis: SCD ?GI prophylaxis: H2B ?Lines: N/A ?Foley:  N/A ?Code Status:  full code ?Last date of multidisciplinary goals of care discussion [ daughter updated at bedside ] ? ?Labs   ?CBC: ?Recent Labs  ?Lab 12/21/21 ?1204  ?HGB 13.6  ?HCT 40.0  ? ? ?Basic Metabolic Panel: ?Recent Labs  ?  Lab 12/21/21 ?1204  ?NA 133*  ?K 3.4*  ?CL 93*  ?GLUCOSE 197*  ?BUN 29*  ?CREATININE 4.70*  ? ?GFR: ?Estimated Creatinine Clearance: 7.9 mL/min (A) (by C-G formula based on SCr of 4.7 mg/dL (H)). ?No results for input(s): PROCALCITON, WBC, LATICACIDVEN in the last 168 hours. ? ?Liver Function Tests: ?No results for input(s): AST, ALT, ALKPHOS, BILITOT, PROT, ALBUMIN in the last 168 hours. ?No results for input(s): LIPASE, AMYLASE in the last 168 hours. ?No results for input(s): AMMONIA in  the last 168 hours. ? ?ABG ?   ?Component Value Date/Time  ? TCO2 28 12/21/2021 1204  ?  ? ?Coagulation Profile: ?No results for input(s): INR, PROTIME in the last 168 hours. ? ?Cardiac Enzymes: ?No results for input(s): CKTOTAL, CKMB, CKMBINDEX, TROPONINI in the last 168 hours. ? ?HbA1C: ?Hemoglobin A1C  ?Date/Time Value Ref Range Status  ?08/31/2021 10:16 AM 9.7 (A) 4.0 - 5.6 % Final  ?04/13/2021 11:19 AM 8.1 (A) 4.0 - 5.6 % Final  ? ?Hgb A1c MFr Bld  ?Date/Time Value Ref Range Status  ?08/06/2010 02:27 PM 8.8 %   ?01/10/2010 08:49 AM 9.8 %   ? ? ?CBG: ?Recent Labs  ?Lab 12/21/21 ?1121 12/21/21 ?1233 12/21/21 ?1424  ?GLUCAP 192* 170* 149*  ? ? ?Review of Systems:   ?Unable to obtain, intubated sedated  ? ?Past Medical History:  ?She,  has a past medical history of AKI (acute kidney injury) ([place]) (08/12/2019), Chronic kidney disease, CKD stage G1/A3, GFR > 90 and albumin creatinine ratio >300 mg/g (07/08/2016), Diabetes mellitus, Dyspnea, Ganglion cyst, GERD (gastroesophageal reflux disease), History of radiation therapy (02/27/10 -04/13/10), Hyperlipidemia, Hypertension, Iron deficiency anemia, Malignant neoplasm ([place]) (04/11), Menopause, Thyroid disease, and Tobacco abuse.  ? ?Surgical History:  ? ?Past Surgical History:  ?Procedure Laterality Date  ? AV FISTULA PLACEMENT Left 06/02/2020  ? Procedure: INSERTION OF ARTERIOVENOUS (AV) GORE-TEX GRAFT ARM LEFT;  Surgeon: Marty Heck, MD;  Location: [place];  Service: Vascular;  Laterality: Left;  ? AV FISTULA PLACEMENT Right 12/27/2020  ? Procedure: RIGHT ARM CEPHALIC ARTERIOVENOUS FISTULA CREATION;  Surgeon: Waynetta Sandy, MD;  Location: [facility];  Service: Vascular;  Laterality: Right;  ? IR FLUORO GUIDE CV LINE LEFT  05/15/2020  ? IR REMOVAL TUN CV CATH W/O FL  05/15/2020  ? IR REMOVAL TUN CV CATH W/O FL  06/15/2021  ? IR US GUIDE VASC ACCESS LEFT  05/15/2020  ? NECK SURGERY    ? REDUCTION MAMMAPLASTY    ?  ? ?Social History:  ? reports that she quit smoking  about 8 months ago. Her smoking use included cigarettes. She smoked an average of .3 packs per day. She has never used smokeless tobacco. She reports that she does not drink alcohol and does not use drugs.  ? ?Family History:  ?Her family history includes Cancer in her father and paternal aunt; Chronic Renal Failure in her brother and sister; Diabetes in her mother; Hypertension in her mother; Stroke in her father. There is no history of Breast cancer.  ? ?Allergies ?Allergies  ?Allergen Reactions  ? Hydrochlorothiazide Itching and Other (See Comments)  ?  Hyponatremia ?  ? Penicillins Other (See Comments)  ?  "takes all of her hair out" ?.Has patient had a PCN reaction causing immediate rash, facial/tongue/throat swelling, SOB or lightheadedness with hypotension: No ?Has patient had a PCN reaction causing severe rash involving mucus membranes or skin necrosis: No ?Has patient had a PCN reaction that required hospitalization  No ?Has patient had a PCN reaction occurring within the last 10 years: No ?If all of the above answers are "NO", then may proceed with Cephalosporin use. ?  ?  ? ?Home Medications  ?Prior to Admission medications   ?Medication Sig Start Date End Date Taking? Authorizing Provider  ?atorvastatin (LIPITOR) 80 MG tablet Take 1 tablet (80 mg total) by mouth daily. 04/13/21 04/13/22 Yes Katsadouros, Vasilios, MD  ?calcium-vitamin D (CALCIUM 500+D HIGH POTENCY) 500-400 MG-UNIT tablet Take 1 tablet by mouth daily. 02/29/20  Yes [name], MD  ?cloNIDine (CATAPRES - DOSED IN MG/24 HR) 0.2 mg/24hr patch PLACE 1 PATCH (0.2 MG TOTAL) ONTO THE SKIN ONCE A WEEK. ?Patient taking differently: Place 0.2 mg onto the skin once a week. Changes patch on Monday 06/07/21  Yes Iona Beard, MD  ?fluticasone (FLOVENT HFA) 44 MCG/ACT inhaler Inhale 2 puffs into the lungs daily. 10/01/21  Yes Timothy Lasso, MD  ?hydrALAZINE (APRESOLINE) 50 MG tablet TAKE 1 TABLET BY MOUTH TWICE A DAY ?Patient taking differently:  Take 50 mg by mouth daily. 10/01/21  Yes Timothy Lasso, MD  ?insulin aspart (NOVOLOG FLEXPEN) 100 UNIT/ML FlexPen Inject 5 Units into the skin 3 (three) times daily with meals. 05/16/20  Yes Provider, His

## 2021-12-21 NOTE — Anesthesia Procedure Notes (Addendum)
Procedure Name: Intubation ?Date/Time: 12/21/2021 3:12 PM ?Performed by: Trinna Post., CRNA ?Pre-anesthesia Checklist: Patient identified, Emergency Drugs available, Suction available, Patient being monitored and Timeout performed ?Patient Re-evaluated:Patient Re-evaluated prior to induction ?Oxygen Delivery Method: Circle system utilized ?Preoxygenation: Pre-oxygenation with 100% oxygen ?Induction Type: IV induction ?Ventilation: Unable to mask ventilate ?Laryngoscope Size: Glidescope and 3 ?Tube type: Oral ?Tube size: 6.5 mm ?Number of attempts: 1 ?Airway Equipment and Method: Rigid stylet and Video-laryngoscopy ?Placement Confirmation: ETT inserted through vocal cords under direct vision, positive ETCO2 and breath sounds checked- equal and bilateral ?Secured at: 23 cm ?Tube secured with: Tape ?Dental Injury: Teeth and Oropharynx as per pre-operative assessment  ?Difficulty Due To: Difficulty was unanticipated and Difficult Airway-  due to edematous airway ?Comments: Unable to seat LMA 4 or LMA 3 in oropharynx. Patient jaw and neck extremely hard and stiff. Decision made to convert to GA with ETT. DL with MAC 3 with clear view beyond epiglottis but no cords were visible due to edematous glottic opening. Decision made to use glidescope for better view of airway and attempt with smaller sized ETT. Similar issue with clear view beyond epiglottis but entire airway swollen and no cords were visible, however a 6.5 ETT was successfully passed beyond epiglottis. Breath sounds bilateral and equal with positive EtCO2. ? ? ? ? ?

## 2021-12-21 NOTE — Op Note (Signed)
? ? ?  Patient name: Susan Evans MRN: [id number] DOB: 1956-01-10 Sex: female ? ?12/21/2021 ?Pre-operative Diagnosis: esrd, exposed left upper arm av graft ?Post-operative diagnosis:  Same ?Surgeon:  Eda Paschal. Donzetta Matters, MD ?Assistant: Leontine Locket, PA ?Procedure Performed:  Excision of left arm AV graft ? ?Indications: 66 year old female with end-stage renal disease currently dialyzing via right arm AV fistula.  She has a former graft in her left upper arm that is no longer functional but has eroded through the skin.  She is now indicated for excision. ? ?Findings: The graft was not grossly infected.  We divided the graft back and healthy tissue and excluded this and then the graft was removed.  I elected to staple the area where the graft was previously exposed. ? ?I completion the patient remained intubated due to difficult airway and concerns for respiratory distress on extubation. ?  ?Procedure:  The patient was identified in the holding area and taken to the operating room where she is placed supine on the operative table.  Initially an LMA was placed but they could not get a good air exchange and thus patient was fully intubated but was noted to have some swelling in her larynx.  She was then sterilely prepped and draped in the left upper extremity usual fashion, antibiotics were administered timeout was called.  I began with 2 transverse incisions overlying the graft proximal and distal to the area of exposed graft.  We dissected down to the graft and transected it.  Graft was chronically thrombosed.  These wounds were then irrigated and the graft was excluded using interrupted Vicryl figure-of-eight suture.  We then remove the graft entirely through the incision where it was exposed.  This was thoroughly irrigated.  The 2 new incisions were closed with Monocryl the middle incision was debrided back to healthy tissue and closed with staples.  Dermabond was placed on the fully closed incisions.  The arm was  then wrapped with Ace wrap.  Patient remained intubated due to concerns for respiratory distress with extubation.  All counts were correct at completion. ? ?Given the complexity of the case,  the assistant was necessary in order to expedite the procedure and safely perform the technical aspects of the operation.  The assistant provided traction and countertraction to assist with exposure of the graft.  These skills could not have been adequately performed by a scrub tech assistant.  ? ? ?EBL: 10cc ? ?Caree Wolpert C. Donzetta Matters, MD ?[facility] ?Office: [phone] ?Pager: [phone] ? ? ?

## 2021-12-21 NOTE — Interval H&P Note (Signed)
History and Physical Interval Note: ? ?12/21/2021 ?11:15 AM ? ?[name]  has presented today for surgery, with the diagnosis of Infected Left Arm Arteriovenous Graft.  The various methods of treatment have been discussed with the patient and family. After consideration of risks, benefits and other options for treatment, the patient has consented to  Procedure(s): ?EXCISION OF LEFT ARM ARTERIOVENOUS GORETEX GRAFT ([place]) (Left) as a surgical intervention.  The patient's history has been reviewed, patient examined, no change in status, stable for surgery.  I have reviewed the patient's chart and labs.  Questions were answered to the patient's satisfaction.   ? ? ?Susan Evans ? ? ?

## 2021-12-21 NOTE — Anesthesia Preprocedure Evaluation (Addendum)
Anesthesia Evaluation  ?Patient identified by MRN, date of birth, ID band ?Patient awake ? ? ? ?Reviewed: ?Allergy & Precautions, NPO status , Patient's Chart, lab work & pertinent test results ? ?History of Anesthesia Complications ?Negative for: history of anesthetic complications ? ?Airway ?Mallampati: II ? ?TM Distance: >3 FB ?Neck ROM: Limited ? ? ? Dental ? ?(+) Edentulous Lower, Edentulous Upper ?  ?Pulmonary ?former smoker,  ?  ?Pulmonary exam normal ? ? ? ? ? ? ? Cardiovascular ?hypertension, Normal cardiovascular exam ? ? ?  ?Neuro/Psych ?negative neurological ROS ? negative psych ROS  ? GI/Hepatic ?Neg liver ROS, GERD  ,  ?Endo/Other  ?diabetes, Type 2 ? Renal/GU ?ESRF and DialysisRenal disease (HD T/Th/Sa)  ?negative genitourinary ?  ?Musculoskeletal ?negative musculoskeletal ROS ?(+)  ? Abdominal ?  ?Peds ? Hematology ?negative hematology ROS ?(+)   ?Anesthesia Other Findings ?Hx of oral pharyngeal ca s/p radiation 2011 ? Reproductive/Obstetrics ?negative OB ROS ? ?  ? ? ? ? ? ? ? ? ? ? ? ? ? ?  ?  ? ? ? ? ? ? ?Anesthesia Physical ?Anesthesia Plan ? ?ASA: 3 ? ?Anesthesia Plan: General  ? ?Post-op Pain Management: Tylenol PO (pre-op)*  ? ?Induction: Intravenous ? ?PONV Risk Score and Plan: 3 and Treatment may vary due to age or medical condition, Ondansetron and Dexamethasone ? ?Airway Management Planned: LMA ? ?Additional Equipment: None ? ?Intra-op Plan:  ? ?Post-operative Plan: Extubation in OR ? ?Informed Consent: I have reviewed the patients History and Physical, chart, labs and discussed the procedure including the risks, benefits and alternatives for the proposed anesthesia with the patient or authorized representative who has indicated his/her understanding and acceptance.  ? ? ? ?Dental advisory given ? ?Plan Discussed with: CRNA ? ?Anesthesia Plan Comments:   ? ? ? ? ? ?Anesthesia Quick Evaluation ? ?

## 2021-12-21 NOTE — Transfer of Care (Signed)
Immediate Anesthesia Transfer of Care Note ? ?Patient: Susan Evans  ?  ?Procedure(s) Performed: EXCISION OF LEFT ARM ARTERIOVENOUS GORETEX GRAFT ([place]) (Left) ? ?Patient Location: PACU and ICU ? ?Anesthesia Type:General ? ?Level of Consciousness: sedated and Patient remains intubated per anesthesia plan ? ?Airway & Oxygen Therapy: Patient remains intubated per anesthesia plan and Patient placed on Ventilator (see vital sign flow sheet for setting) ? ?Post-op Assessment: Report given to RN and Post -op Vital signs reviewed and stable ? ?Post vital signs: Reviewed and stable ? ?Last Vitals:  ?Vitals Value Taken Time  ?BP    ?Temp    ?Pulse 72 12/21/21 1615  ?Resp 18 12/21/21 1623  ?SpO2 100 % 12/21/21 1615  ?Vitals shown include unvalidated device data. ? ?Last Pain:  ?Vitals:  ? 12/21/21 1152  ?TempSrc:   ?PainSc: 0-No pain  ?   ? ?  ? ?Complications: No notable events documented. ?

## 2021-12-21 NOTE — Progress Notes (Signed)
Intraoperative anesthesia summary: ? ?Susan Evans has a history of oral cancer and radiation therapy to bilateral neck in approximately 2011. During her procedure today, she was found to have a difficult airway requiring attempts with LMA #4 then LMA #3 which were unsuccessful. She was very difficult to mask ventilate despite 2 person technique with oral airway. One attempt by CRNA with direct laryngoscopy with poor view obtained. Decision was made to intubate with Glidescope. View obtained with Glidscope with very friable tissue noted, epiglottis clearly seen but glottic opening completely obscured by swollen tissues which are friable when suctioned. Significant resistance when 7.0 ETT advanced, switched to 6.5 ETT which advanced without significant resistance. ? ?Due to multiple airway attempts and intubation difficulty even with smaller ETT, plan to leave patient intubated and admit to ICU for delayed extubation. Dexamethasone '10mg'$  given in OR. Case curbsided with ENT physician on call (Dr. Benjamine Mola) who agrees with plan. Patient's surgery and anesthetic were otherwise uneventful.  ? ? ? ?K. Wallis Bamberg, MD, MPH ?Anesthesiology ? ?

## 2021-12-22 DIAGNOSIS — Z781 Physical restraint status: Secondary | ICD-10-CM | POA: Diagnosis not present

## 2021-12-22 DIAGNOSIS — Z992 Dependence on renal dialysis: Secondary | ICD-10-CM | POA: Diagnosis not present

## 2021-12-22 DIAGNOSIS — T8579XA Infection and inflammatory reaction due to other internal prosthetic devices, implants and grafts, initial encounter: Secondary | ICD-10-CM | POA: Diagnosis not present

## 2021-12-22 DIAGNOSIS — E876 Hypokalemia: Secondary | ICD-10-CM | POA: Diagnosis not present

## 2021-12-22 DIAGNOSIS — K219 Gastro-esophageal reflux disease without esophagitis: Secondary | ICD-10-CM | POA: Diagnosis not present

## 2021-12-22 DIAGNOSIS — Z7984 Long term (current) use of oral hypoglycemic drugs: Secondary | ICD-10-CM | POA: Diagnosis not present

## 2021-12-22 DIAGNOSIS — E1122 Type 2 diabetes mellitus with diabetic chronic kidney disease: Secondary | ICD-10-CM | POA: Diagnosis not present

## 2021-12-22 DIAGNOSIS — Z794 Long term (current) use of insulin: Secondary | ICD-10-CM | POA: Diagnosis not present

## 2021-12-22 DIAGNOSIS — T884XXA Failed or difficult intubation, initial encounter: Secondary | ICD-10-CM | POA: Diagnosis not present

## 2021-12-22 DIAGNOSIS — N186 End stage renal disease: Secondary | ICD-10-CM | POA: Diagnosis not present

## 2021-12-22 DIAGNOSIS — N2581 Secondary hyperparathyroidism of renal origin: Secondary | ICD-10-CM | POA: Diagnosis not present

## 2021-12-22 DIAGNOSIS — Z87891 Personal history of nicotine dependence: Secondary | ICD-10-CM | POA: Diagnosis not present

## 2021-12-22 DIAGNOSIS — T82868A Thrombosis of vascular prosthetic devices, implants and grafts, initial encounter: Secondary | ICD-10-CM | POA: Diagnosis not present

## 2021-12-22 DIAGNOSIS — M898X9 Other specified disorders of bone, unspecified site: Secondary | ICD-10-CM | POA: Diagnosis not present

## 2021-12-22 DIAGNOSIS — Y832 Surgical operation with anastomosis, bypass or graft as the cause of abnormal reaction of the patient, or of later complication, without mention of misadventure at the time of the procedure: Secondary | ICD-10-CM | POA: Diagnosis not present

## 2021-12-22 DIAGNOSIS — Z8521 Personal history of malignant neoplasm of larynx: Secondary | ICD-10-CM | POA: Diagnosis not present

## 2021-12-22 DIAGNOSIS — E785 Hyperlipidemia, unspecified: Secondary | ICD-10-CM | POA: Diagnosis not present

## 2021-12-22 DIAGNOSIS — J9601 Acute respiratory failure with hypoxia: Secondary | ICD-10-CM | POA: Diagnosis not present

## 2021-12-22 DIAGNOSIS — J384 Edema of larynx: Secondary | ICD-10-CM | POA: Diagnosis not present

## 2021-12-22 DIAGNOSIS — Z923 Personal history of irradiation: Secondary | ICD-10-CM | POA: Diagnosis not present

## 2021-12-22 DIAGNOSIS — R54 Age-related physical debility: Secondary | ICD-10-CM | POA: Diagnosis not present

## 2021-12-22 DIAGNOSIS — E0590 Thyrotoxicosis, unspecified without thyrotoxic crisis or storm: Secondary | ICD-10-CM | POA: Diagnosis not present

## 2021-12-22 DIAGNOSIS — D649 Anemia, unspecified: Secondary | ICD-10-CM | POA: Diagnosis not present

## 2021-12-22 DIAGNOSIS — T82590A Other mechanical complication of surgically created arteriovenous fistula, initial encounter: Secondary | ICD-10-CM | POA: Diagnosis not present

## 2021-12-22 DIAGNOSIS — M6281 Muscle weakness (generalized): Secondary | ICD-10-CM | POA: Diagnosis not present

## 2021-12-22 DIAGNOSIS — I120 Hypertensive chronic kidney disease with stage 5 chronic kidney disease or end stage renal disease: Secondary | ICD-10-CM | POA: Diagnosis not present

## 2021-12-22 DIAGNOSIS — Z9221 Personal history of antineoplastic chemotherapy: Secondary | ICD-10-CM | POA: Diagnosis not present

## 2021-12-22 LAB — BASIC METABOLIC PANEL
Anion gap: 14 (ref 5–15)
BUN: 31 mg/dL — ABNORMAL HIGH (ref 8–23)
CO2: 22 mmol/L (ref 22–32)
Calcium: 8.6 mg/dL — ABNORMAL LOW (ref 8.9–10.3)
Chloride: 98 mmol/L (ref 98–111)
Creatinine, Ser: 4.71 mg/dL — ABNORMAL HIGH (ref 0.44–1.00)
GFR, Estimated: 10 mL/min — ABNORMAL LOW (ref >60)
Glucose, Bld: 101 mg/dL — ABNORMAL HIGH (ref 70–99)
Potassium: 3.6 mmol/L (ref 3.5–5.1)
Sodium: 134 mmol/L — ABNORMAL LOW (ref 135–145)

## 2021-12-22 LAB — TRIGLYCERIDES: Triglycerides: 73 mg/dL (ref <150)

## 2021-12-22 LAB — CBC
HCT: 30.6 % — ABNORMAL LOW (ref 36.0–46.0)
Hemoglobin: 9.8 g/dL — ABNORMAL LOW (ref 12.0–15.0)
MCH: 24.2 pg — ABNORMAL LOW (ref 26.0–34.0)
MCHC: 32 g/dL (ref 30.0–36.0)
MCV: 75.6 fL — ABNORMAL LOW (ref 80.0–100.0)
Platelets: 262 10*3/uL (ref 150–400)
RBC: 4.05 MIL/uL (ref 3.87–5.11)
RDW: 14.8 % (ref 11.5–15.5)
WBC: 5.7 10*3/uL (ref 4.0–10.5)
nRBC: 0 % (ref 0.0–0.2)

## 2021-12-22 LAB — GLUCOSE, CAPILLARY
Glucose-Capillary: 100 mg/dL — ABNORMAL HIGH (ref 70–99)
Glucose-Capillary: 100 mg/dL — ABNORMAL HIGH (ref 70–99)
Glucose-Capillary: 120 mg/dL — ABNORMAL HIGH (ref 70–99)
Glucose-Capillary: 109 mg/dL — ABNORMAL HIGH (ref 70–99)
Glucose-Capillary: 124 mg/dL — ABNORMAL HIGH (ref 70–99)

## 2021-12-22 MED ORDER — HEPARIN SODIUM (PORCINE) 5000 UNIT/ML IJ SOLN
5000.0000 [IU] | Freq: Three times a day (TID) | INTRAMUSCULAR | Status: DC
  Administered 2021-12-22 – 2021-12-24 (×6): 5000 [IU] via SUBCUTANEOUS
  Filled 2021-12-22 (×6): qty 1

## 2021-12-22 MED ORDER — PANTOPRAZOLE 2 MG/ML SUSPENSION: 40.0000 mg | Freq: Every day | ORAL | Status: DC

## 2021-12-22 MED ORDER — CHLORHEXIDINE GLUCONATE CLOTH 2 % EX PADS
6.0000 | MEDICATED_PAD | Freq: Every day | CUTANEOUS | Status: DC
  Administered 2021-12-22 – 2021-12-24 (×2): 6 via TOPICAL

## 2021-12-22 MED ORDER — DOCUSATE SODIUM 50 MG/5ML PO LIQD: 100.0000 mg | Freq: Two times a day (BID) | ORAL | Status: DC

## 2021-12-22 MED ORDER — PANTOPRAZOLE SODIUM 40 MG IV SOLR
40.0000 mg | INTRAVENOUS | Status: DC
  Administered 2021-12-22 – 2021-12-24 (×3): 40 mg via INTRAVENOUS
  Filled 2021-12-22 (×3): qty 10

## 2021-12-22 NOTE — Progress Notes (Signed)
? ? ?  Subjective  - POD #1, s/p excisionof left arm AVGG ? ?Remains intubated ? ? ?Physical Exam: ? ?Left arm dressing clean and dry ?Intubated, sedated ? ? ? ?Assessment/Plan:  POD #1 ? ?Stable from vascular perspective ?CCM managing vent, will remain intubated today ?Dr.Cain to evaluate Monday ? ?Annamarie Major ?12/22/2021 ?10:48 AM ?-- ? ?Vitals:  ? 12/22/21 0900 12/22/21 1000  ?BP: 115/74 136/69  ?Pulse: 78 72  ?Resp: 16 16  ?Temp:    ?SpO2: 95% 98%  ? ? ?Intake/Output Summary (Last 24 hours) at 12/22/2021 1048 ?Last data filed at 12/22/2021 1000 ?Gross per 24 hour  ?Intake 982 ml  ?Output 5 ml  ?Net 977 ml  ? ? ? ?Laboratory ?CBC ?   ?Component Value Date/Time  ? WBC 5.7 12/22/2021 0455  ? HGB 9.8 (L) 12/22/2021 0455  ? HGB 11.9 04/07/2014 0944  ? HCT 30.6 (L) 12/22/2021 0455  ? HCT 37.4 04/07/2014 0944  ? PLT 262 12/22/2021 0455  ? PLT 215 04/07/2014 0944  ? ? ?BMET ?   ?Component Value Date/Time  ? NA 134 (L) 12/22/2021 0455  ? NA 135 02/29/2020 1510  ? NA 131 (L) 04/07/2014 0943  ? K 3.6 12/22/2021 0455  ? K 4.0 04/07/2014 0943  ? CL 98 12/22/2021 0455  ? CL 100 10/07/2012 1033  ? CO2 22 12/22/2021 0455  ? CO2 27 04/07/2014 0943  ? GLUCOSE 101 (H) 12/22/2021 0455  ? GLUCOSE 258 (H) 04/07/2014 0943  ? GLUCOSE 137 (H) 10/07/2012 1033  ? BUN 31 (H) 12/22/2021 0455  ? BUN 40 (H) 02/29/2020 1510  ? BUN 12.5 04/07/2014 0943  ? CREATININE 4.71 (H) 12/22/2021 0455  ? CREATININE 1.1 04/07/2014 0943  ? CALCIUM 8.6 (L) 12/22/2021 0455  ? CALCIUM 10.3 04/07/2014 0943  ? GFRNONAA 10 (L) 12/22/2021 0455  ? GFRNONAA 85 01/06/2014 1358  ? GFRAA 15 (L) 02/29/2020 1510  ? GFRAA >89 01/06/2014 1358  ? ? ?COAG ?Lab Results  ?Component Value Date  ? INR 1.0 08/12/2019  ? ?No results found for: PTT ? ?Antibiotics ?Anti-infectives (From admission, onward)  ? ? Start     Dose/Rate Route Frequency Ordered Stop  ? 12/21/21 1133  vancomycin (VANCOCIN) 1-5 GM/200ML-% IVPB  Status:  Discontinued       ?Note to Pharmacy: Barbie Haggis N:  cabinet override  ?    12/21/21 1133 12/21/21 1220  ? 12/21/21 1128  vancomycin (VANCOREADY) IVPB 750 mg/150 mL       ? 750 mg ?150 mL/hr over 60 Minutes Intravenous 60 min pre-op 12/21/21 1128 12/21/21 1651  ? ?  ? ? ? ?V. Leia Alf, M.D., FACS ?[facility] [place] ?Office: [phone] ?Pager:  [phone]  ?

## 2021-12-22 NOTE — Progress Notes (Signed)
? ?NAME:  Susan Evans, MRN:  [id number], DOB:  1956-02-12, LOS: 1 ?ADMISSION DATE:  12/21/2021, CONSULTATION DATE:  12/21/21 ?REFERRING MD:  Donzetta Matters - [facility], CHIEF COMPLAINT: difficult airway /  subglottic stenosis  ? 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 At start of case, unable to place LMA x 2,  so case converted to general. Difficulty visualizing airway, required glide for ultimate placement of 6.5 ETT. Decadron given for degree of edema, and noted to have friable appearing tissue.  ? ?PCCM is consulted periop for admission and management in this setting.  ? ?Pertinent  Medical History  ?ESRD ?Oropharyngeal carcinoma ?HTN ?DM2 ? ?Significant Hospital Events: ?Including procedures, antibiotic start and stop dates in addition to other pertinent events   ?3/31 OR for excision of infected LUE AVG (outpatient surgery). Unforseen difficult airway, PCCM requested to admit ? ?Interim History / Subjective:  ? ?No acute events overnight ?Tolerating vent support well ? ?Objective   ?Blood pressure 134/66, pulse 71, temperature (!) 97.3 ?F (36.3 ?C), temperature source Oral, resp. rate 16, height _0  (1.575 m), weight 43.6 kg, SpO2 98 %. ?   ?Vent Mode: PRVC ?FiO2 (%):  [30 %-40 %] 30 % ?Set Rate:  [16 bmp] 16 bmp ?Vt Set:  [400 mL] 400 mL ?PEEP:  [5 cmH20] 5 cmH20 ?Plateau Pressure:  [16 cmH20-17 cmH20] 17 cmH20  ? ?Intake/Output Summary (Last 24 hours) at 12/22/2021 0739 ?Last data filed at 12/22/2021 0700 ?Gross per 24 hour  ?Intake 916.84 ml  ?Output 5 ml  ?Net 911.84 ml  ? ?Filed Weights  ? 12/19/21 1629 12/21/21 1117 12/22/21 0350  ?Weight: 42.2 kg 42.2 kg 43.6 kg  ? ? ?Examination: ?General: frail appearing woman, no acute distress, resting in bed ?HEENT: [place]/AT, moist mucous membranes, sclera anicteric ?Neuro: lightly sedated but moving all extremities and follows  commands ?CV: rrr, s1s2, no murmurs ?PULM: clear to auscultation bilaterally. No wheezing ?GI: soft, non-tender, non-distended, BS+ ?Extremities: warm, no edema ?Skin: no rashes  ? ?Resolved Hospital Problem list   ? ? ?Assessment & Plan:  ? ?Post operative ventilator management / ETT present ?Subglottic edema / Difficult airway ?Hx oropharyngeal carcinoma  ?-unable to place LMA, multiple intubation attempts, 6.5 placed with glide. Very stiff neck. Tissue noted to be friable and edematous  ?P ?- Patient to remain intubated over next 24 hours  ?- Discussed case with ENT, will check for air leak tomorrow and if not present then recommendation will be to proceed with trachesotomy ?- This is a difficult situation, because if she is to receive a tracheostomy this would compromise her future treatments with dialysis  ?- Continue decadron q6hrs ?-BUE wrist restraints  ?- Surgical airway kit in the room as back up ?- If no air leak present tomorrow, will present option to extubate patient with bronchoscopic view of airway during ET tube removal for visualization of the airway on the way out. If she were to develop respiratory distress we would discuss decision on re-intubation and moving forward with tracheostomy.  ? ?ESRD ?Exposed LUE AVG (POD0 excision 3/31) ?-there was concern for infected graft but note that Op note w/ healthy tissue and was not infected appearing.  ?P ?-post op per VVS  ?-Hd per nephro  ? ?HTN ?-hold home meds while hemodynamics equilibrate with sedation  ? ?DM2 ?-SSI  ? ?  Best Practice (right click and "Reselect all SmartList Selections" daily)  ? ?Diet/type: NPO ?DVT prophylaxis: prophylactic heparin  ?GI prophylaxis: PPI ?Lines: N/A ?Foley:  N/A ?Code Status:  full code ?Last date of multidisciplinary goals of care discussion [ daughter updated at bedside on 3/31 ] ? ?Labs   ?CBC: ?Recent Labs  ?Lab 12/21/21 ?1204 12/21/21 ?1705 12/21/21 ?1937 12/22/21 ?0455  ?WBC  --   --  8.9 5.7  ?HGB 13.6 11.6*  10.6* 9.8*  ?HCT 40.0 34.0* 33.4* 30.6*  ?MCV  --   --  77.3* 75.6*  ?PLT  --   --  202 262  ? ? ?Basic Metabolic Panel: ?Recent Labs  ?Lab 12/21/21 ?1204 12/21/21 ?1705 12/21/21 ?1937 12/22/21 ?0455  ?NA 133* 132* 132* 134*  ?K 3.4* 3.4* 5.4* 3.6  ?CL 93*  --  96* 98  ?CO2  --   --  19* 22  ?GLUCOSE 197*  --  168* 101*  ?BUN 29*  --  28* 31*  ?CREATININE 4.70*  --  4.60* 4.71*  ?CALCIUM  --   --  8.5* 8.6*  ? ?GFR: ?Estimated Creatinine Clearance: 8.2 mL/min (A) (by C-G formula based on SCr of 4.71 mg/dL (H)). ?Recent Labs  ?Lab 12/21/21 ?1937 12/22/21 ?0455  ?WBC 8.9 5.7  ? ? ?Liver Function Tests: ?No results for input(s): AST, ALT, ALKPHOS, BILITOT, PROT, ALBUMIN in the last 168 hours. ?No results for input(s): LIPASE, AMYLASE in the last 168 hours. ?No results for input(s): AMMONIA in the last 168 hours. ? ?ABG ?   ?Component Value Date/Time  ? PHART 7.487 (H) 12/21/2021 1705  ? PCO2ART 33.8 12/21/2021 1705  ? PO2ART 179 (H) 12/21/2021 1705  ? HCO3 25.6 12/21/2021 1705  ? TCO2 27 12/21/2021 1705  ? O2SAT 100 12/21/2021 1705  ?  ? ?Coagulation Profile: ?No results for input(s): INR, PROTIME in the last 168 hours. ? ?Cardiac Enzymes: ?No results for input(s): CKTOTAL, CKMB, CKMBINDEX, TROPONINI in the last 168 hours. ? ?HbA1C: ?Hemoglobin A1C  ?Date/Time Value Ref Range Status  ?08/31/2021 10:16 AM 9.7 (A) 4.0 - 5.6 % Final  ?04/13/2021 11:19 AM 8.1 (A) 4.0 - 5.6 % Final  ? ?Hgb A1c MFr Bld  ?Date/Time Value Ref Range Status  ?12/21/2021 07:37 PM 6.8 (H) 4.8 - 5.6 % Final  ?  Comment:  ?  (NOTE) ?Pre diabetes:          5.7%-6.4% ? ?Diabetes:              >6.4% ? ?Glycemic control for   <7.0% ?adults with diabetes ?  ?08/06/2010 02:27 PM 8.8 %   ? ? ?CBG: ?Recent Labs  ?Lab 12/21/21 ?1735 12/21/21 ?1942 12/21/21 ?2349 12/22/21 ?0430 12/22/21 ?0737  ?GLUCAP 186* 185* 153* 100* 100*  ? ?Critical care time: 35 min ?  ? ?Freda Jackson, MD ?[facility] ?Office: [phone] ? ? ?See Amion for  personal pager ?PCCM on call pager [phone] until 7pm. ?Please call Elink 7p-7a. [phone] ? ? ? ? ? ? ?

## 2021-12-22 NOTE — Consult Note (Signed)
[facility] ?Renal Consultation Note  ?  ?Indication for Consultation:  Management of ESRD/hemodialysis, anemia, hypertension/volume, and secondary hyperparathyroidism. ? ?HPI: Susan Evans is a 66 y.o. female with PMH including ESRD on dialysis TTS, T2DM, HTN, hyperthyroidism, and oropharyngeal carcinoma, who is admitted s/p AVG excision due to exposed graft. She uses a RUE AVF for dialysis. After surgery, there was edema noted in her airway and she was unable to be extubated due to difficulty maintaining airway. ENT was consulted and recommended continuing antibiotics until Sunday. BP elevated on admission, now stable. Labs notable for Hgb 9.8, Na 134, K+ 3.6, BUN 31, Cr 4.71, Ca 8.6. CXR yesterday without any airspace disease. On propofol and fentanyl but not requiring pressor support. Nephrology has been consulted for hemodialysis. Patient unable to contribute to ROS due to sedation.  ? ?Past Medical History:  ?Diagnosis Date  ? AKI (acute kidney injury) ([place]) 08/12/2019  ? Chronic kidney disease   ? Dialysis T/Th/Sat- [address]  ? CKD stage G1/A3, GFR > 90 and albumin creatinine ratio >300 mg/g 07/08/2016  ? Nephrotic range proteinuria  ? Diabetes mellitus   ? type 2   ? Dyspnea   ? "when I have too much fluid."  ? Ganglion cyst   ? right wrist  ? GERD (gastroesophageal reflux disease)   ? History of radiation therapy 02/27/10 -04/13/10  ? left base of tongue, bilat neck  ? Hyperlipidemia   ? Hypertension   ? Iron deficiency anemia   ? Malignant neoplasm ([place]) 04/11  ? oropharyngeal carcinoma  ? Menopause   ? Thyroid disease   ? Hyperthyroidism  ? Tobacco abuse   ? PT HAS QUIT  ? ?Past Surgical History:  ?Procedure Laterality Date  ? AV FISTULA PLACEMENT Left 06/02/2020  ? Procedure: INSERTION OF ARTERIOVENOUS (AV) GORE-TEX GRAFT ARM LEFT;  Surgeon: Marty Heck, MD;  Location: [facility];  Service: Vascular;  Laterality: Left;  ? AV FISTULA PLACEMENT Right 12/27/2020  ? Procedure:  RIGHT ARM CEPHALIC ARTERIOVENOUS FISTULA CREATION;  Surgeon: Waynetta Sandy, MD;  Location: [place];  Service: Vascular;  Laterality: Right;  ? IR FLUORO GUIDE CV LINE LEFT  05/15/2020  ? IR REMOVAL TUN CV CATH W/O FL  05/15/2020  ? IR REMOVAL TUN CV CATH W/O FL  06/15/2021  ? IR US GUIDE VASC ACCESS LEFT  05/15/2020  ? NECK SURGERY    ? REDUCTION MAMMAPLASTY    ? ?Family History  ?Problem Relation Age of Onset  ? Stroke Father   ? Cancer Father   ?     unknown ca  ? Hypertension Mother   ? Diabetes Mother   ? Chronic Renal Failure Sister   ? Chronic Renal Failure Brother   ? Cancer Paternal Aunt   ?     colon ca  ? Breast cancer Neg Hx   ? ?Social History: ? reports that she quit smoking about 8 months ago. Her smoking use included cigarettes. She smoked an average of .3 packs per day. She has never used smokeless tobacco. She reports that she does not drink alcohol and does not use drugs. ? ?ROS: As per HPI otherwise negative. ? ?Physical Exam: ?Vitals:  ? 12/22/21 0600 12/22/21 0700 12/22/21 0743 12/22/21 0745  ?BP: 138/71 134/66    ?Pulse: 71 71  80  ?Resp: 16 16  19   ?Temp:   (!) 97.5 ?F (36.4 ?C)   ?TempSrc:   Oral   ?SpO2: 98% 98%  98%  ?Weight:      ?Height:      ?   ?General: Sedated, on ventilator  ?Head: Normocephalic, atraumatic, sclera non-icteric, mucus membranes are moist. ?Neck: JVD not elevated. ?Lungs: Clear bilaterally to auscultation without wheezes, rales, or rhonchi. Intubated on vent ?Heart: RRR with normal S1, S2. No murmurs, rubs, or gallops appreciated. ?Abdomen: Soft, non-distended with normoactive bowel sounds. No guarding. No obvious abdominal masses. ?Lower extremities: No edema or ischemic changes, no open wounds. ?Neuro: Sedated, unresponsive ?Dialysis Access: LUE AVF + t/b ? ?Allergies  ?Allergen Reactions  ? Hydrochlorothiazide Itching and Other (See Comments)  ?  Hyponatremia ?  ? Penicillins Other (See Comments)  ?  "takes all of her hair out" ?.Has patient had a PCN reaction  causing immediate rash, facial/tongue/throat swelling, SOB or lightheadedness with hypotension: No ?Has patient had a PCN reaction causing severe rash involving mucus membranes or skin necrosis: No ?Has patient had a PCN reaction that required hospitalization No ?Has patient had a PCN reaction occurring within the last 10 years: No ?If all of the above answers are "NO", then may proceed with Cephalosporin use. ?  ? ?Prior to Admission medications   ?Medication Sig Start Date End Date Taking? Authorizing Provider  ?atorvastatin (LIPITOR) 80 MG tablet Take 1 tablet (80 mg total) by mouth daily. 04/13/21 04/13/22 Yes Katsadouros, Vasilios, MD  ?calcium-vitamin D (CALCIUM 500+D HIGH POTENCY) 500-400 MG-UNIT tablet Take 1 tablet by mouth daily. 02/29/20  Yes [name], MD  ?cloNIDine (CATAPRES - DOSED IN MG/24 HR) 0.2 mg/24hr patch PLACE 1 PATCH (0.2 MG TOTAL) ONTO THE SKIN ONCE A WEEK. ?Patient taking differently: Place 0.2 mg onto the skin once a week. Changes patch on Monday 06/07/21  Yes Iona Beard, MD  ?fluticasone (FLOVENT HFA) 44 MCG/ACT inhaler Inhale 2 puffs into the lungs daily. 10/01/21  Yes Timothy Lasso, MD  ?hydrALAZINE (APRESOLINE) 50 MG tablet TAKE 1 TABLET BY MOUTH TWICE A DAY ?Patient taking differently: Take 50 mg by mouth daily. 10/01/21  Yes Timothy Lasso, MD  ?insulin aspart (NOVOLOG FLEXPEN) 100 UNIT/ML FlexPen Inject 5 Units into the skin 3 (three) times daily with meals. 05/16/20  Yes [provider]  ?insulin glargine (LANTUS) 100 UNIT/ML injection Inject 0.34 mL into the skin once daily ?Patient taking differently: Inject 20 Units into the skin 2 (two) times daily. 04/13/21  Yes Katsadouros, Vasilios, MD  ?lidocaine-prilocaine (EMLA) cream Apply 1 application. topically as needed (dialysis).   Yes [provider]  ?loratadine (CLARITIN) 10 MG tablet Take 1 tablet (10 mg total) by mouth daily. 06/14/20 01/15/23 Yes Alexandria Lodge, MD  ?omeprazole (PRILOSEC) 20 MG capsule  TAKE 1 CAPSULE BY MOUTH EVERY DAY 11/05/21  Yes Aslam, Loralyn Freshwater, MD  ?Polyethyl Glycol-Propyl Glycol (SYSTANE OP) Place 1 drop into both eyes daily as needed (for dry eyes).   Yes [provider]  ?sevelamer carbonate (RENVELA) 800 MG tablet Take 800 mg by mouth 3 (three) times daily with meals.   Yes [provider]  ?verapamil (CALAN-SR) 180 MG CR tablet TAKE 2 TABLETS (360 MG TOTAL) BY MOUTH AT BEDTIME. 11/13/21  Yes Timothy Lasso, MD  ?Accu-Chek FastClix Lancets MISC 5 (FIVE) TIMES DAILY DIAGNOSIS CODE E11.9 09/10/19   Axel Filler, MD  ?B-D UF III MINI PEN NEEDLES 31G X 5 MM MISC USE TO INJECT NOVOLOG 3 TIMES A DAY. DIAGNOSIS CODE E11.9, Z79.4 04/03/21   Dorethea Clan, DO  ?Blood Glucose Monitoring Suppl (ACCU-CHEK AVIVA PLUS) w/Device KIT  Use to test blood glucose  3 times daily 12/15/16   [name], MD  ?CVS D3 50 MCG (2000 UT) CAPS TAKE 1 CAPSULE BY MOUTH EVERY DAY ?Patient not taking: Reported on 12/19/2021 05/08/21   Gaylan Gerold, DO  ?glucose blood (ACCU-CHEK GUIDE) test strip TEST 5 (FIVE) TIMES DAILY DIAGNOSIS CODE E11.9 11/23/20   Alexandria Lodge, MD  ?HYDROcodone-acetaminophen (NORCO) 5-325 MG tablet Take 1 tablet by mouth every 6 (six) hours as needed for moderate pain. ?Patient not taking: Reported on 12/19/2021 12/27/20   Dagoberto Ligas, PA-C  ?Insulin Syringe-Needle U-100 31G X 15/64" 0.3 ML MISC USE TO INJECT LANTUS INSULIN TWO TIMES A DAY 04/13/21   Riesa Pope, MD  ? ?Current Facility-Administered Medications  ?Medication Dose Route Frequency Provider Last Rate Last Admin  ? acetaminophen (TYLENOL) tablet 650 mg  650 mg Oral Q4H PRN Bowser, Laurel Dimmer, NP      ? chlorhexidine gluconate (MEDLINE KIT) (PERIDEX) 0.12 % solution 15 mL  15 mL Mouth Rinse BID Freddi Starr, MD   15 mL at 12/21/21 1955  ? Chlorhexidine Gluconate Cloth 2 % PADS 6 each  6 each Topical Q24H Freddi Starr, MD      ? dexamethasone (DECADRON) injection 4 mg  4 mg Intravenous Q6H  Bowser, Laurel Dimmer, NP   4 mg at 12/22/21 0530  ? docusate sodium (COLACE) capsule 100 mg  100 mg Oral BID PRN Cristal Generous, NP      ? docusate sodium (COLACE) capsule 100 mg  100 mg Oral BID Freddi Starr

## 2021-12-23 DIAGNOSIS — T8579XA Infection and inflammatory reaction due to other internal prosthetic devices, implants and grafts, initial encounter: Secondary | ICD-10-CM | POA: Diagnosis not present

## 2021-12-23 DIAGNOSIS — T884XXA Failed or difficult intubation, initial encounter: Secondary | ICD-10-CM | POA: Diagnosis not present

## 2021-12-23 LAB — BASIC METABOLIC PANEL
Anion gap: 18 — ABNORMAL HIGH (ref 5–15)
BUN: 48 mg/dL — ABNORMAL HIGH (ref 8–23)
CO2: 17 mmol/L — ABNORMAL LOW (ref 22–32)
Calcium: 8.4 mg/dL — ABNORMAL LOW (ref 8.9–10.3)
Chloride: 100 mmol/L (ref 98–111)
Creatinine, Ser: 5.74 mg/dL — ABNORMAL HIGH (ref 0.44–1.00)
GFR, Estimated: 8 mL/min — ABNORMAL LOW (ref >60)
Glucose, Bld: 165 mg/dL — ABNORMAL HIGH (ref 70–99)
Potassium: 3.6 mmol/L (ref 3.5–5.1)
Sodium: 135 mmol/L (ref 135–145)

## 2021-12-23 LAB — GLUCOSE, CAPILLARY
Glucose-Capillary: 115 mg/dL — ABNORMAL HIGH (ref 70–99)
Glucose-Capillary: 120 mg/dL — ABNORMAL HIGH (ref 70–99)
Glucose-Capillary: 131 mg/dL — ABNORMAL HIGH (ref 70–99)
Glucose-Capillary: 144 mg/dL — ABNORMAL HIGH (ref 70–99)
Glucose-Capillary: 149 mg/dL — ABNORMAL HIGH (ref 70–99)
Glucose-Capillary: 240 mg/dL — ABNORMAL HIGH (ref 70–99)

## 2021-12-23 LAB — CBC
HCT: 31 % — ABNORMAL LOW (ref 36.0–46.0)
Hemoglobin: 9.6 g/dL — ABNORMAL LOW (ref 12.0–15.0)
MCH: 23.6 pg — ABNORMAL LOW (ref 26.0–34.0)
MCHC: 31 g/dL (ref 30.0–36.0)
MCV: 76.4 fL — ABNORMAL LOW (ref 80.0–100.0)
Platelets: 292 10*3/uL (ref 150–400)
RBC: 4.06 MIL/uL (ref 3.87–5.11)
RDW: 14.6 % (ref 11.5–15.5)
WBC: 12.1 10*3/uL — ABNORMAL HIGH (ref 4.0–10.5)
nRBC: 0 % (ref 0.0–0.2)

## 2021-12-23 LAB — MAGNESIUM: Magnesium: 2 mg/dL (ref 1.7–2.4)

## 2021-12-23 MED ORDER — LABETALOL HCL 5 MG/ML IV SOLN: 10.0000 mg | Freq: Four times a day (QID) | INTRAVENOUS | Status: DC | PRN

## 2021-12-23 MED ORDER — LABETALOL HCL 5 MG/ML IV SOLN
INTRAVENOUS | Status: AC
  Administered 2021-12-23: 10 mg via INTRAVENOUS
  Filled 2021-12-23: qty 4

## 2021-12-23 MED ORDER — LIDOCAINE-PRILOCAINE 2.5-2.5 % EX CREA
TOPICAL_CREAM | CUTANEOUS | Status: DC | PRN
  Filled 2021-12-23: qty 5

## 2021-12-23 MED ORDER — HYDRALAZINE HCL 50 MG PO TABS
50.0000 mg | ORAL_TABLET | Freq: Two times a day (BID) | ORAL | Status: DC
  Administered 2021-12-23 – 2021-12-24 (×2): 50 mg via ORAL
  Filled 2021-12-23 (×2): qty 1

## 2021-12-23 NOTE — Progress Notes (Signed)
Admit: 12/21/2021 ?LOS: 2 ? ?78F ESRD THS [place] s/p LUE AVG excision 3/31 cont intubation 2/2 subglottic edema / difficult airway ? ?Subjective:  ?Stable overnight ?Did not rec HD yesterday  ? ?04/01 0701 - 04/02 0700 ?In: 500.9 [I.V.:500.9] ?Out: -  ? ?Filed Weights  ? 12/21/21 1117 12/22/21 0350 12/23/21 0410  ?Weight: 42.2 kg 43.6 kg 43.3 kg  ? ? ?Scheduled Meds: ? chlorhexidine gluconate (MEDLINE KIT)  15 mL Mouth Rinse BID  ? Chlorhexidine Gluconate Cloth  6 each Topical Q0600  ? dexamethasone (DECADRON) injection  4 mg Intravenous Q6H  ? heparin injection (subcutaneous)  5,000 Units Subcutaneous Q8H  ? insulin aspart  0-9 Units Subcutaneous Q4H  ? mouth rinse  15 mL Mouth Rinse 10 times per day  ? pantoprazole (PROTONIX) IV  40 mg Intravenous Q24H  ? polyethylene glycol  17 g Oral Daily  ? ?Continuous Infusions: ? fentaNYL infusion INTRAVENOUS Stopped (12/23/21 0741)  ? lactated ringers 5 mL/hr at 12/23/21 0800  ? propofol (DIPRIVAN) infusion Stopped (12/23/21 0742)  ? ?PRN Meds:.acetaminophen, docusate sodium, fentaNYL, ondansetron (ZOFRAN) IV, polyethylene glycol ? ?Current Labs: reviewed  ? ? ?Physical Exam:  Blood pressure (!) 157/64, pulse 84, temperature (!) 97.5 ?F (36.4 ?C), temperature source Oral, resp. rate 14, height _0  (1.575 m), weight 43.3 kg, SpO2 98 %. ?Intubated, more awake ?RRR ?NCAT, ETT in OP ?RUE AVF +B/T ?No LEE ?S/nt/nd ?RRR ?Coarse bs b/l ? ?Dialysis Orders: Center: [facility] [place]  on TTS . ?160NRe 4 hours BFR 350 DFR 500 EDW 41.4kg 3K 2.5Ca AVF 15g ?Mircera 30mg IV q 4 weeks- last dose 12/13/21 ?Hectorol 1 mcg q HD ? ?A ? Post op ventilator management/subglottic edema: Unable to be intubated post op, on steroids. Management per pulm/ENT ?Exposed LUE AVG: S/p graft excision 3/31, fortunately no evidence of infection on exam.  VVS following ? ESRD:  Dialysis on TTS schedule outpatient. No immediate indications for dialysis today. Will plan for dialysis today, then resume TTS schedule.  ?  Hypertension/volume: BP reasonable, no volume overload on exam. Approximately 2kg over EDW, UF as tolerated.  ? Anemia: Hgb 9.6. ESA recently dosed, will continue monitor trend outpatient and increase ESA as needed.  ? Metabolic bone disease: Calcium controlled, currently NPO so holding binders. Continue hectorol ? Nutrition:  Currently NPO ? ?P ?As above, HD today ?Medication Issues; ?Preferred narcotic agents for pain control are hydromorphone, fentanyl, and methadone. Morphine should not be used.  ?Baclofen should be avoided ?Avoid oral sodium phosphate and magnesium citrate based laxatives / bowel preps  ? ? ?RPearson GrippeMD ?12/23/2021, 8:12 AM ? ?Recent Labs  ?Lab 12/21/21 ?1937 12/22/21 ?0455 12/23/21 ?08003 ?NA 132* 134* 135  ?K 5.4* 3.6 3.6  ?CL 96* 98 100  ?CO2 19* 22 17*  ?GLUCOSE 168* 101* 165*  ?BUN 28* 31* 48*  ?CREATININE 4.60* 4.71* 5.74*  ?CALCIUM 8.5* 8.6* 8.4*  ? ?Recent Labs  ?Lab 12/21/21 ?1937 12/22/21 ?0455 12/23/21 ?04917 ?WBC 8.9 5.7 12.1*  ?HGB 10.6* 9.8* 9.6*  ?HCT 33.4* 30.6* 31.0*  ?MCV 77.3* 75.6* 76.4*  ?PLT 202 262 292  ? ? ? ? ? ? ? ? ? ?  ?

## 2021-12-23 NOTE — Procedures (Signed)
Extubation Procedure Note ? ?Patient Details:   ?Name: Susan Evans ?DOB: Mar 09, 1956 ?MRN: [id number] ?  ?Airway Documentation:  ?  ?Vent end date: 12/23/21 Vent end time: 0952  ? ?Evaluation ? O2 sats: stable throughout and currently acceptable ?Complications: No apparent complications ?Patient did tolerate procedure well. ?Bilateral Breath Sounds: Rhonchi, Diminished ?  ?Yes ? ?Patient extubated to a 4L [place]. Cuff leak was heard. No stridor was noted. MD, NP, and RN at bedside during extubation. ETT was removed with bronchoscopy scope in place so MD could ensure we had a patent aware post extubation. Patient tolerated well. ? ?Tiburcio Bash ?12/23/2021, 10:03 AM ? ?

## 2021-12-23 NOTE — Progress Notes (Signed)
? ?NAME:  Susan Evans, MRN:  [id number], DOB:  09-18-1956, LOS: 2 ?ADMISSION DATE:  12/21/2021, CONSULTATION DATE:  12/21/21 ?REFERRING MD:  Donzetta Matters - VVS, CHIEF COMPLAINT: difficult airway /  subglottic stenosis  ? 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 At start of case, unable to place LMA x 2,  so case converted to general. Difficulty visualizing airway, required glide for ultimate placement of 6.5 ETT. Decadron given for degree of edema, and noted to have friable appearing tissue.  ? ?PCCM is consulted periop for admission and management in this setting.  ? ?Pertinent  Medical History  ?ESRD ?Oropharyngeal carcinoma ?HTN ?DM2 ? ?Significant Hospital Events: ?Including procedures, antibiotic start and stop dates in addition to other pertinent events   ?3/31 OR for excision of infected LUE AVG (outpatient surgery). Unforseen difficult airway, PCCM requested to admit ? ?Interim History / Subjective:  ? ?No acute events overnight ?Air leak present this morning and patient extubated successfully ? ?Objective   ?Blood pressure (!) 153/58, pulse 80, temperature (!) 97.5 ?F (36.4 ?C), temperature source Oral, resp. rate 16, height _0  (1.575 m), weight 43.3 kg, SpO2 99 %. ?   ?Vent Mode: PSV;CPAP ?FiO2 (%):  [30 %-40 %] 30 % ?Set Rate:  [16 bmp] 16 bmp ?Vt Set:  [400 mL] 400 mL ?PEEP:  [5 cmH20] 5 cmH20 ?Pressure Support:  [12 cmH20] 12 cmH20 ?Plateau Pressure:  [13 cmH20-18 cmH20] 13 cmH20  ? ?Intake/Output Summary (Last 24 hours) at 12/23/2021 0757 ?Last data filed at 12/23/2021 0700 ?Gross per 24 hour  ?Intake 500.85 ml  ?Output --  ?Net 500.85 ml  ? ?Filed Weights  ? 12/21/21 1117 12/22/21 0350 12/23/21 0410  ?Weight: 42.2 kg 43.6 kg 43.3 kg  ? ? ?Examination: ?General: frail appearing woman, no acute distress, resting in bed ?HEENT: /AT, moist mucous membranes,  sclera anicteric, no stridor, voice clear ?Neuro: alert and oriented, following commands ?CV: rrr, s1s2, no murmurs ?PULM: clear to auscultation bilaterally. No wheezing ?GI: soft, non-tender, non-distended, BS+ ?Extremities: warm, no edema ?Skin: no rashes  ? ?Resolved Hospital Problem list   ? ? ?Assessment & Plan:  ? ?Post operative ventilator management / ETT present ?Subglottic edema / Difficult airway ?Hx oropharyngeal carcinoma  ?-unable to place LMA, multiple intubation attempts, 6.5 placed with glide. Very stiff neck. Tissue noted to be friable and edematous  ?P ?- Patient extubated today ?- Continue decadron q6hrs ?- Surgical airway kit in the room as back up ? ?ESRD ?Exposed LUE AVG (POD0 excision 3/31) ?-there was concern for infected graft but note that Op note w/ healthy tissue and was not infected appearing.  ?P ?-post op per VVS  ?-Hd per nephro  ? ?HTN ?-hold home meds  ? ?DM2 ?-SSI  ? ?Best Practice (right click and "Reselect all SmartList Selections" daily)  ? ?Diet/type: NPO ?DVT prophylaxis: prophylactic heparin  ?GI prophylaxis: PPI ?Lines: N/A ?Foley:  N/A ?Code Status:  full code ?Last date of multidisciplinary goals of care discussion [ daughter updated at bedside on 3/31 ] ? ?Labs   ?CBC: ?Recent Labs  ?Lab 12/21/21 ?1204 12/21/21 ?1705 12/21/21 ?1937 12/22/21 ?0455 12/23/21 ?9924  ?WBC  --   --  8.9 5.7 12.1*  ?HGB 13.6 11.6* 10.6* 9.8* 9.6*  ?HCT 40.0 34.0* 33.4* 30.6* 31.0*  ?MCV  --   --  77.3* 75.6* 76.4*  ?PLT  --   --  202 262 292  ? ? ?Basic Metabolic Panel: ?Recent Labs  ?Lab 12/21/21 ?1204 12/21/21 ?1705 12/21/21 ?1937 12/22/21 ?0455 12/23/21 ?1610  ?NA 133* 132* 132* 134* 135  ?K 3.4* 3.4* 5.4* 3.6 3.6  ?CL 93*  --  96* 98 100  ?CO2  --   --  19* 22 17*  ?GLUCOSE 197*  --  168* 101* 165*  ?BUN 29*  --  28* 31* 48*  ?CREATININE 4.70*  --  4.60* 4.71* 5.74*  ?CALCIUM  --   --  8.5* 8.6* 8.4*  ?MG  --   --   --   --  2.0  ? ?GFR: ?Estimated Creatinine Clearance: 6.7 mL/min (A) (by  C-G formula based on SCr of 5.74 mg/dL (H)). ?Recent Labs  ?Lab 12/21/21 ?1937 12/22/21 ?0455 12/23/21 ?9604  ?WBC 8.9 5.7 12.1*  ? ? ?Liver Function Tests: ?No results for input(s): AST, ALT, ALKPHOS, BILITOT, PROT, ALBUMIN in the last 168 hours. ?No results for input(s): LIPASE, AMYLASE in the last 168 hours. ?No results for input(s): AMMONIA in the last 168 hours. ? ?ABG ?   ?Component Value Date/Time  ? PHART 7.487 (H) 12/21/2021 1705  ? PCO2ART 33.8 12/21/2021 1705  ? PO2ART 179 (H) 12/21/2021 1705  ? HCO3 25.6 12/21/2021 1705  ? TCO2 27 12/21/2021 1705  ? O2SAT 100 12/21/2021 1705  ?  ? ?Coagulation Profile: ?No results for input(s): INR, PROTIME in the last 168 hours. ? ?Cardiac Enzymes: ?No results for input(s): CKTOTAL, CKMB, CKMBINDEX, TROPONINI in the last 168 hours. ? ?HbA1C: ?Hemoglobin A1C  ?Date/Time Value Ref Range Status  ?08/31/2021 10:16 AM 9.7 (A) 4.0 - 5.6 % Final  ?04/13/2021 11:19 AM 8.1 (A) 4.0 - 5.6 % Final  ? ?Hgb A1c MFr Bld  ?Date/Time Value Ref Range Status  ?12/21/2021 07:37 PM 6.8 (H) 4.8 - 5.6 % Final  ?  Comment:  ?  (NOTE) ?Pre diabetes:          5.7%-6.4% ? ?Diabetes:              >6.4% ? ?Glycemic control for   <7.0% ?adults with diabetes ?  ?08/06/2010 02:27 PM 8.8 %   ? ? ?CBG: ?Recent Labs  ?Lab 12/22/21 ?1510 12/22/21 ?1953 12/23/21 ?0010 12/23/21 ?0330 12/23/21 ?0727  ?GLUCAP 109* 124* 149* 120* 131*  ? ?Critical care time: 35 min ?  ? ?Freda Jackson, MD ?[facility] ?Office: [phone] ? ? ?See Amion for personal pager ?PCCM on call pager [phone] until 7pm. ?Please call Elink 7p-7a. [phone] ? ? ? ? ? ? ?

## 2021-12-23 NOTE — Evaluation (Signed)
Clinical/Bedside Swallow Evaluation ?Patient Details  ?Name: Susan Evans ?MRN: [id number] ?Date of Birth: 09-13-1956 ? ?Today's Date: 12/23/2021 ?Time: SLP Start Time (ACUTE ONLY): 9417 SLP Stop Time (ACUTE ONLY): 1402 ?SLP Time Calculation (min) (ACUTE ONLY): 20 min ? ?Past Medical History:  ?Past Medical History:  ?Diagnosis Date  ? AKI (acute kidney injury) ([place]) 08/12/2019  ? Chronic kidney disease   ? Dialysis T/Th/Sat- [address]  ? CKD stage G1/A3, GFR > 90 and albumin creatinine ratio >300 mg/g 07/08/2016  ? Nephrotic range proteinuria  ? Diabetes mellitus   ? type 2   ? Dyspnea   ? "when I have too much fluid."  ? Ganglion cyst   ? right wrist  ? GERD (gastroesophageal reflux disease)   ? History of radiation therapy 02/27/10 -04/13/10  ? left base of tongue, bilat neck  ? Hyperlipidemia   ? Hypertension   ? Iron deficiency anemia   ? Malignant neoplasm ([place]) 04/11  ? oropharyngeal carcinoma  ? Menopause   ? Thyroid disease   ? Hyperthyroidism  ? Tobacco abuse   ? PT HAS QUIT  ? ?Past Surgical History:  ?Past Surgical History:  ?Procedure Laterality Date  ? AV FISTULA PLACEMENT Left 06/02/2020  ? Procedure: INSERTION OF ARTERIOVENOUS (AV) GORE-TEX GRAFT ARM LEFT;  Surgeon: Marty Heck, MD;  Location: [facility];  Service: Vascular;  Laterality: Left;  ? AV FISTULA PLACEMENT Right 12/27/2020  ? Procedure: RIGHT ARM CEPHALIC ARTERIOVENOUS FISTULA CREATION;  Surgeon: Waynetta Sandy, MD;  Location: [facility];  Service: Vascular;  Laterality: Right;  ? IR FLUORO GUIDE CV LINE LEFT  05/15/2020  ? IR REMOVAL TUN CV CATH W/O FL  05/15/2020  ? IR REMOVAL TUN CV CATH W/O FL  06/15/2021  ? IR US GUIDE VASC ACCESS LEFT  05/15/2020  ? NECK SURGERY    ? REDUCTION MAMMAPLASTY    ? ?HPI:  ?Pt is a 66 y/o female who presented on 3/31 for excision of LUE AV fistula that was infected. Her case was converted to general anesthesia as LMA was unable to be placed x 2. There was edema noted in her airway on glydescope,  and she remained intubated due to the issues obtaining her airway. Decadron given for degree of edema, and noted to have friable appearing tissue. ENT consulted and plan initiated to continue steroids until 4/2. ETT 3/31-4/2 [phone]). 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  ?  ?Assessment / Plan / Recommendation  ?Clinical Impression ? Pt was seen for bedside swallow evaluation with her sisters present. Pt and one of her sisters reported that the pt coughs intermittently during meals, and pt stated that she has been crushing her meds recently due to whole pills "getting hung up". 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 Pt was edentulous and stated that she does not eat with dentures. She demonstrated coughing once with the initial bolus of thin liquids, but no other s/sx of aspiration were noted with p.o. intake including more challenging larger boluses of thin liquids via straw. Mastication was mildly prolonged with regular texture solids, and pt reported that she typically consumes soft solids. A dysphagia 3 diet with thin liquids is recommended at this time, and SLP will follow to assess tolerance. ?SLP Visit Diagnosis: Dysphagia, unspecified (R13.10) ?   ?Aspiration Risk ?  Mild aspiration risk  ?  ?Diet Recommendation Dysphagia 3 (Mech soft);Thin liquid  ? ?Liquid Administration via: Cup;Straw ?Medication Administration: Crushed with puree ?Supervision: Staff to assist with self feeding ?Compensations: Slow rate;Small sips/bites ?Postural Changes: Seated upright at 90 degrees  ?  ?Other  Recommendations Oral Care Recommendations: Oral care BID   ? ?Recommendations for follow up therapy are one component of a multi-disciplinary discharge planning process, led by the attending physician.  Recommendations may be updated based on  patient status, additional functional criteria and insurance authorization. ? ?Follow up Recommendations  (TBD)  ? ? ?  ?Assistance Recommended at Discharge    ?Functional Status Assessment    ?Frequency and Duration min 2x/week  ?1 week ?  ?   ? ?Prognosis Prognosis for Safe Diet Advancement: Good ?Barriers to Reach Goals: Time post onset  ? ?  ? ?Swallow Study   ?General Date of Onset: 12/22/21 ?HPI: Pt is a 66 y/o female who presented on 3/31 for excision of LUE AV fistula that was infected. Her case was converted to general anesthesia as LMA was unable to be placed x 2. There was edema noted in her airway on glydescope, and she remained intubated due to the issues obtaining her airway. Decadron given for degree of edema, and noted to have friable appearing tissue. ENT consulted and plan initiated to continue steroids until 4/2. ETT 3/31-4/2 [phone]). 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 ?Type of Study: Bedside Swallow Evaluation ?Previous Swallow Assessment: none ?Diet Prior to this Study: NPO ?Temperature Spikes Noted: No ?Respiratory Status: Nasal cannula ?History of Recent Intubation: Yes ?Length of Intubations (days): 2 days ?Date extubated: 12/22/21 ?Behavior/Cognition: Alert;Cooperative;Pleasant mood ?Oral Cavity Assessment: Within Functional Limits ?Oral Care Completed by SLP: No ?Oral Cavity - Dentition: Edentulous ?Vision: Functional for self-feeding ?Self-Feeding Abilities: Needs assist ?Patient Positioning: Upright in bed;Postural control adequate for testing ?Baseline Vocal Quality: Hoarse (baseline) ?Volitional Cough: Strong ?Volitional Swallow: Able to elicit  ?  ?Oral/Motor/Sensory Function Overall Oral Motor/Sensory Function: Within functional limits (pharyngeal fibrotic tissue noted upon palpation)   ?Ice Chips Ice chips: Within functional limits ?Presentation: Spoon   ?Thin Liquid Thin Liquid:  Impaired ?Presentation: Cup;Straw ?Pharyngeal  Phase Impairments: Cough - Immediate;Cough - Delayed  ?  ?Nectar Thick Nectar Thick Liquid: Not tested   ?Honey Thick Honey Thick Liquid: Not tested   ?Puree Puree: Within functional limits ?Presentation: Spoon   ?Solid ? ? ?  Solid: Impaired ?Presentation: Spoon ?Oral Phase Impairments: Impaired mastication  ? ?  ?Jax Kentner I. Hardin Negus, [place], CCC-SLP ?Acute Rehabilitation Services ?Office number [phone] ?Pager [phone] ? ? ?Horton Marshall ?12/23/2021,2:15 PM ? ? ? ? ? ?

## 2021-12-24 ENCOUNTER — Encounter (HOSPITAL_COMMUNITY): Payer: Self-pay | Admitting: Vascular Surgery

## 2021-12-24 DIAGNOSIS — T884XXD Failed or difficult intubation, subsequent encounter: Secondary | ICD-10-CM | POA: Diagnosis not present

## 2021-12-24 DIAGNOSIS — N186 End stage renal disease: Secondary | ICD-10-CM | POA: Diagnosis not present

## 2021-12-24 DIAGNOSIS — E876 Hypokalemia: Secondary | ICD-10-CM | POA: Diagnosis not present

## 2021-12-24 DIAGNOSIS — T884XXA Failed or difficult intubation, initial encounter: Secondary | ICD-10-CM | POA: Diagnosis not present

## 2021-12-24 LAB — RENAL FUNCTION PANEL
Albumin: 3.7 g/dL (ref 3.5–5.0)
Anion gap: 15 (ref 5–15)
BUN: 8 mg/dL (ref 8–23)
CO2: 24 mmol/L (ref 22–32)
Calcium: 9 mg/dL (ref 8.9–10.3)
Chloride: 95 mmol/L — ABNORMAL LOW (ref 98–111)
Creatinine, Ser: 1.53 mg/dL — ABNORMAL HIGH (ref 0.44–1.00)
GFR, Estimated: 38 mL/min — ABNORMAL LOW (ref >60)
Glucose, Bld: 158 mg/dL — ABNORMAL HIGH (ref 70–99)
Phosphorus: 2.4 mg/dL — ABNORMAL LOW (ref 2.5–4.6)
Potassium: 3.3 mmol/L — ABNORMAL LOW (ref 3.5–5.1)
Sodium: 134 mmol/L — ABNORMAL LOW (ref 135–145)

## 2021-12-24 LAB — CBC
HCT: 31.1 % — ABNORMAL LOW (ref 36.0–46.0)
HCT: 34.9 % — ABNORMAL LOW (ref 36.0–46.0)
Hemoglobin: 10.1 g/dL — ABNORMAL LOW (ref 12.0–15.0)
Hemoglobin: 11.4 g/dL — ABNORMAL LOW (ref 12.0–15.0)
MCH: 24 pg — ABNORMAL LOW (ref 26.0–34.0)
MCH: 24.4 pg — ABNORMAL LOW (ref 26.0–34.0)
MCHC: 32.5 g/dL (ref 30.0–36.0)
MCHC: 32.7 g/dL (ref 30.0–36.0)
MCV: 73.5 fL — ABNORMAL LOW (ref 80.0–100.0)
MCV: 75.1 fL — ABNORMAL LOW (ref 80.0–100.0)
Platelets: 311 10*3/uL (ref 150–400)
Platelets: 354 10*3/uL (ref 150–400)
RBC: 4.14 MIL/uL (ref 3.87–5.11)
RBC: 4.75 MIL/uL (ref 3.87–5.11)
RDW: 14.4 % (ref 11.5–15.5)
RDW: 14.6 % (ref 11.5–15.5)
WBC: 20.4 10*3/uL — ABNORMAL HIGH (ref 4.0–10.5)
WBC: 23.4 10*3/uL — ABNORMAL HIGH (ref 4.0–10.5)
nRBC: 0.1 % (ref 0.0–0.2)
nRBC: 0.2 % (ref 0.0–0.2)

## 2021-12-24 LAB — BASIC METABOLIC PANEL
Anion gap: 14 (ref 5–15)
BUN: 20 mg/dL (ref 8–23)
CO2: 23 mmol/L (ref 22–32)
Calcium: 8.3 mg/dL — ABNORMAL LOW (ref 8.9–10.3)
Chloride: 99 mmol/L (ref 98–111)
Creatinine, Ser: 2.9 mg/dL — ABNORMAL HIGH (ref 0.44–1.00)
GFR, Estimated: 17 mL/min — ABNORMAL LOW (ref >60)
Glucose, Bld: 165 mg/dL — ABNORMAL HIGH (ref 70–99)
Potassium: 3.2 mmol/L — ABNORMAL LOW (ref 3.5–5.1)
Sodium: 136 mmol/L (ref 135–145)

## 2021-12-24 LAB — HEPATITIS B SURFACE ANTIBODY,QUALITATIVE: Hep B S Ab: REACTIVE — AB

## 2021-12-24 LAB — GLUCOSE, CAPILLARY
Glucose-Capillary: 179 mg/dL — ABNORMAL HIGH (ref 70–99)
Glucose-Capillary: 84 mg/dL (ref 70–99)
Glucose-Capillary: 91 mg/dL (ref 70–99)
Glucose-Capillary: 135 mg/dL — ABNORMAL HIGH (ref 70–99)
Glucose-Capillary: 158 mg/dL — ABNORMAL HIGH (ref 70–99)

## 2021-12-24 LAB — HEPATITIS B SURFACE ANTIGEN: Hepatitis B Surface Ag: NONREACTIVE

## 2021-12-24 LAB — MAGNESIUM: Magnesium: 1.9 mg/dL (ref 1.7–2.4)

## 2021-12-24 MED ORDER — POTASSIUM CHLORIDE 20 MEQ PO PACK
40.0000 meq | PACK | Freq: Once | ORAL | Status: AC
  Administered 2021-12-24: 40 meq via ORAL
  Filled 2021-12-24: qty 2

## 2021-12-24 MED ORDER — LIP MEDEX EX OINT
TOPICAL_OINTMENT | CUTANEOUS | Status: DC | PRN
  Filled 2021-12-24: qty 7

## 2021-12-24 MED ORDER — ACETAMINOPHEN 325 MG PO TABS: 650.0000 mg | ORAL_TABLET | ORAL | Status: DC | PRN

## 2021-12-24 NOTE — Care Management (Signed)
Late Entry: 1615 12-24-21 Case Manager spoke with patient this morning regarding personal care services. Patient has Medicaid and will need to call DSS for assistance to see how many hours she qualifies for personal care services. Case Manager provided the information to the daughter so she can aide the patient with calling DSS. No further needs identified at this time.  ?

## 2021-12-24 NOTE — Progress Notes (Addendum)
?  Progress Note ? ? ? ?12/24/2021 ?7:15 AM ?3 Days Post-Op ? ?Subjective:  denies any pain in her left arm ? ? ?Vitals:  ? 12/24/21 0200 12/24/21 0300  ?BP: 107/63 102/62  ?Pulse: (!) 105 (!) 101  ?Resp: 18 16  ?Temp:  98.4 ?F (36.9 ?C)  ?SpO2: 93% 94%  ? ? ?Physical Exam: ?Lungs:  non labored ?Incisions:  left arm incisions are clean and dry.  No oozing.  ? ? ?CBC ?   ?Component Value Date/Time  ? WBC 20.4 (H) 12/24/2021 0058  ? RBC 4.14 12/24/2021 0058  ? HGB 10.1 (L) 12/24/2021 0058  ? HGB 11.9 04/07/2014 0944  ? HCT 31.1 (L) 12/24/2021 0058  ? HCT 37.4 04/07/2014 0944  ? PLT 311 12/24/2021 0058  ? PLT 215 04/07/2014 0944  ? MCV 75.1 (L) 12/24/2021 0058  ? MCV 73.9 (L) 04/07/2014 0944  ? MCH 24.4 (L) 12/24/2021 0058  ? MCHC 32.5 12/24/2021 0058  ? RDW 14.6 12/24/2021 0058  ? RDW 14.5 04/07/2014 0944  ? LYMPHSABS 0.5 (L) 08/12/2019 0106  ? LYMPHSABS 1.8 04/07/2014 0944  ? MONOABS 1.7 (H) 08/12/2019 0106  ? MONOABS 0.5 04/07/2014 0944  ? EOSABS 0.0 08/12/2019 0106  ? EOSABS 0.1 04/07/2014 0944  ? BASOSABS 0.1 08/12/2019 0106  ? BASOSABS 0.0 04/07/2014 0944  ? ? ?BMET ?   ?Component Value Date/Time  ? NA 136 12/24/2021 0058  ? NA 135 02/29/2020 1510  ? NA 131 (L) 04/07/2014 0943  ? K 3.2 (L) 12/24/2021 0058  ? K 4.0 04/07/2014 0943  ? CL 99 12/24/2021 0058  ? CL 100 10/07/2012 1033  ? CO2 23 12/24/2021 0058  ? CO2 27 04/07/2014 0943  ? GLUCOSE 165 (H) 12/24/2021 0058  ? GLUCOSE 258 (H) 04/07/2014 0943  ? GLUCOSE 137 (H) 10/07/2012 1033  ? BUN 20 12/24/2021 0058  ? BUN 40 (H) 02/29/2020 1510  ? BUN 12.5 04/07/2014 0943  ? CREATININE 2.90 (H) 12/24/2021 0058  ? CREATININE 1.1 04/07/2014 0943  ? CALCIUM 8.3 (L) 12/24/2021 0058  ? CALCIUM 10.3 04/07/2014 0943  ? GFRNONAA 17 (L) 12/24/2021 0058  ? GFRNONAA 85 01/06/2014 1358  ? GFRAA 15 (L) 02/29/2020 1510  ? GFRAA >89 01/06/2014 1358  ? ? ?INR ?   ?Component Value Date/Time  ? INR 1.0 08/12/2019 0331  ? ? ? ?Intake/Output Summary (Last 24 hours) at 12/24/2021 0715 ?Last  data filed at 12/24/2021 0045 ?Gross per 24 hour  ?Intake 326.36 ml  ?Output 2250 ml  ?Net -1923.64 ml  ? ? ? ?Assessment/Plan:  66 y.o. female is s/p:  ?Excision of left arm AV graft  ?3 Days Post-Op ? ? ?-bandage removed this morning and incisions are clean without any oozing.  Ace wrap placed back on arm per pt request.  Will have pt f/u in the office in a couple of weeks to have staples removed.   ?-please call if any questions.  ? ? ?Leontine Locket, PA-C ?[facility] ?[phone] ?12/24/2021 ?7:15 AM ? ?I have independently interviewed and examined patient and agree with PA assessment and plan above.  ? ?Aleia Larocca C. Donzetta Matters, MD ?[facility] ?Office: [phone] ?Pager: [phone] ? ? ?

## 2021-12-24 NOTE — Progress Notes (Signed)
OT Cancellation Note ? ?Patient Details ?Name: Susan Evans ?MRN: [id number] ?DOB: 01-21-1956 ? ? ?Cancelled Treatment:    Reason Eval/Treat Not Completed: Active bedrest order (Will return as schedule allows. Thank you.) ? ?Raquell Richer M Cody Oliger ?Lyann Hagstrom MSOT, OTR/L ?Acute Rehab ?Pager: [phone] ?Office: [phone] ?12/24/2021, 7:39 AM ?

## 2021-12-24 NOTE — Progress Notes (Signed)
eLink Physician-Brief Progress Note ?Patient Name: Susan Evans ?DOB: Sep 09, 1956 ?MRN: [id number] ? ? ?Date of Service ? 12/24/2021  ?HPI/Events of Note ? HD completing. K 3.3  ?eICU Interventions ? Will leave it to HD team for replacements. Follow labs in AM, if low still to replace.   ? ? ? ?Intervention Category ?Minor Interventions: Electrolytes abnormality - evaluation and management ? ?Elmer Sow ?12/24/2021, 12:34 AM ?

## 2021-12-24 NOTE — Progress Notes (Signed)
Pts clothing returned to pt, D/C papers reviewed with pt and daughter [name] at bedside. Pt to go home with Renold Genta.Pt wheeled off unit by Oumar NT.  ?

## 2021-12-24 NOTE — Progress Notes (Signed)
PT Cancellation Note ? ?Patient Details ?Name: Susan Evans ?MRN: [id number] ?DOB: Nov 13, 1955 ? ? ?Cancelled Treatment:    Reason Eval/Treat Not Completed: Active bedrest order Pt currently on strict bed rest. Will await increase in activity orders prior to PT evaluation. Will follow. ? ? ?[place] ?12/24/2021, 7:10 AM ?Marisa Severin, PT, DPT ?Acute Rehabilitation Services ?Pager [phone] ?Office [phone] ? ? ? ?

## 2021-12-24 NOTE — Evaluation (Signed)
Occupational Therapy Evaluation ?Patient Details ?Name: Susan Evans ?MRN: [id number] ?DOB: 12/19/1955 ?Today's Date: 12/24/2021 ? ? ?History of Present Illness 66 yo female presenting on 3/31 for excision of LUE AV fistula that was infected. At start of case, unable to place LMA x 2,   and difficult airway. Intubated 3/31-4/2. 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  ? ?Clinical Impression ?  ?PTA, pt was living alone and was independent; boyfriend and sister visiting often. Pt currently performing ADLs and functional mobility at [facility] A level. Pt presenting near baseline with decreased activity tolerance and ROM at LUE due to edema. BP slightly elevated; notified RN. Pt would benefit from further acute OT to facilitate safe dc. Recommend dc to home once medically stable per physician.     ?   ? ?Recommendations for follow up therapy are one component of a multi-disciplinary discharge planning process, led by the attending physician.  Recommendations may be updated based on patient status, additional functional criteria and insurance authorization.  ? ?Follow Up Recommendations ? No OT follow up  ?  ?Assistance Recommended at Discharge Intermittent Supervision/Assistance  ?Patient can return home with the following A little help with walking and/or transfers;A little help with bathing/dressing/bathroom ? ?  ?Functional Status Assessment ? Patient has had a recent decline in their functional status and demonstrates the ability to make significant improvements in function in a reasonable and predictable amount of time.  ?Equipment Recommendations ? None recommended by OT  ?  ?Recommendations for Other Services PT consult ? ? ?  ?Precautions / Restrictions Precautions ?Precautions: None ?Restrictions ?Weight Bearing Restrictions: No  ? ?  ? ?Mobility Bed Mobility ?  ?  ?  ?  ?  ?  ?  ?General bed mobility comments: Sitting in recliner ?   ? ?Transfers ?Overall transfer level: Needs assistance ?Equipment used: None ?Transfers: Sit to/from Stand ?Sit to Stand: Min guard ?  ?  ?  ?  ?  ?General transfer comment: MIn Guard A for safety ?  ? ?  ?Balance Overall balance assessment: Mild deficits observed, not formally tested ?  ?  ?  ?  ?  ?  ?  ?  ?  ?  ?  ?  ?  ?  ?  ?  ?  ?  ?   ? ?ADL either performed or assessed with clinical judgement  ? ?ADL Overall ADL's : Needs assistance/impaired ?Eating/Feeding: Set up;Supervision/ safety;Sitting ?  ?Grooming: Supervision/safety;Set up;Sitting ?  ?Upper Body Bathing: Supervision/ safety;Set up;Sitting ?  ?Lower Body Bathing: Min guard;Sit to/from stand ?  ?Upper Body Dressing : Supervision/safety;Set up;Sitting ?  ?Lower Body Dressing: Min guard;Sit to/from stand ?  ?Toilet Transfer: Min guard;Ambulation;Rolling walker (2 wheels) ?  ?  ?  ?  ?  ?Functional mobility during ADLs: Min guard;Rolling walker (2 wheels) ?General ADL Comments: Pt performing at [place]. Decreased activity tolerance compared to PLOF. Limited WFL of LUE. Feel she is close to her baseline  ? ? ? ?Vision Baseline Vision/History: 0 No visual deficits ?   ?   ?Perception   ?  ?Praxis   ?  ? ?Pertinent Vitals/Pain Pain Assessment ?Pain Assessment: No/denies pain  ? ? ? ?Hand Dominance Right ?  ?Extremity/Trunk Assessment Upper Extremity Assessment ?Upper Extremity Assessment: LUE deficits/detail ?LUE Deficits / Details: Edema and decreased ROM. Swelling at hand and elbow. ?LUE Coordination: decreased fine motor;decreased  gross motor ?  ?Lower Extremity Assessment ?Lower Extremity Assessment: Defer to PT evaluation ?  ?Cervical / Trunk Assessment ?Cervical / Trunk Assessment: Kyphotic ?  ?Communication Communication ?Communication: No difficulties ?  ?Cognition Arousal/Alertness: Awake/alert ?Behavior During Therapy: [facility] for tasks assessed/performed ?Overall Cognitive Status: Within Functional Limits for tasks assessed ?  ?  ?   ?  ?  ?  ?  ?  ?  ?  ?  ?  ?  ?  ?  ?  ?General Comments: Requiring inceased time for procressing but feel she is at baseline for cognition. ?  ?  ?General Comments  VSS on RA. BP elevated; RN aware ? ?  ?Exercises Exercises: General Upper Extremity ?General Exercises - Upper Extremity ?Elbow Flexion: AROM, Left, 10 reps, Seated ?Elbow Extension: AROM, Left, 10 reps, Seated ?Wrist Flexion: AROM, Left, 10 reps, Seated ?Wrist Extension: AROM, 10 reps, Left, Seated ?Digit Composite Flexion: AROM, Left, 10 reps, Seated ?Composite Extension: AROM, Left, 15 reps, Seated ?  ?Shoulder Instructions    ? ? ?Home Living Family/patient expects to be discharged to:: Private residence ?Living Arrangements: Alone (has b/f) ?Available Help at Discharge: Family;Available PRN/intermittently ?Type of Home: Apartment ?Home Access: Level entry ?  ?  ?  ?  ?  ?Bathroom Shower/Tub: Tub/shower unit ?  ?Bathroom Toilet: Standard ?  ?  ?Home Equipment: None ?  ?Additional Comments: Has 2 flights to get to laundry room with heavy door to get in ?  ? ?  ?Prior Functioning/Environment Prior Level of Function : Independent/Modified Independent ?  ?  ?  ?  ?  ?  ?Mobility Comments: Does not drive. Sister drives. [place]s. ?ADLs Comments: Independent ?  ? ?  ?  ?OT Problem List: Decreased range of motion;Decreased activity tolerance;Decreased knowledge of precautions ?  ?   ?OT Treatment/Interventions: Self-care/ADL training;Therapeutic exercise;DME and/or AE instruction;Energy conservation;Therapeutic activities;Patient/family education  ?  ?OT Goals(Current goals can be found in the care plan section) Acute Rehab OT Goals ?Patient Stated Goal: Go home ?OT Goal Formulation: With patient ?Time For Goal Achievement: 01/07/22 ?Potential to Achieve Goals: Good  ?OT Frequency: Min 2X/week ?  ? ?Co-evaluation   ?  ?  ?  ?  ? ?  ?AM-PAC OT "6 Clicks" Daily Activity     ?Outcome Measure Help from another person eating meals?: None ?Help from another person  taking care of personal grooming?: None ?Help from another person toileting, which includes using toliet, bedpan, or urinal?: A Little ?Help from another person bathing (including washing, rinsing, drying)?: A Little ?Help from another person to put on and taking off regular upper body clothing?: None ?Help from another person to put on and taking off regular lower body clothing?: A Little ?6 Click Score: 21 ?  ?End of Session Nurse Communication: Mobility status ? ?Activity Tolerance: Patient tolerated treatment well ?Patient left: in chair;with call bell/phone within reach ? ?OT Visit Diagnosis: Unsteadiness on feet (R26.81);Other abnormalities of gait and mobility (R26.89);Muscle weakness (generalized) (M62.81)  ?              ?Time: 4496-7591 ?OT Time Calculation (min): 17 min ?Charges:  OT General Charges ?$OT Visit: 1 Visit ?OT Evaluation ?$OT Eval Low Complexity: 1 Low ? ?Dequita Schleicher MSOT, OTR/L ?Acute Rehab ?Pager: [phone] ?Office: [phone] ? ?Geoffrey Mankin M Courtney Fenlon ?12/24/2021, 9:19 AM ?

## 2021-12-24 NOTE — Progress Notes (Signed)
D/C order noted. Contacted [facility] [place] and spoke to [place], Therapist, sports. Clinic aware pt will d/c today and should resume tomorrow.  ? ?Melven Sartorius ?Renal Navigator ?[phone] ?

## 2021-12-24 NOTE — Discharge Summary (Signed)
Physician Discharge Summary  ?Patient ID: ?Susan Evans ?MRN: [id number] ?DOB/AGE: [birth year] 66 y.o. ? ?Admit date: 12/21/2021 ?Discharge date: 12/24/2021 ? ?Admission Diagnoses: infected AV graft left upper extremity ? ?Discharge Diagnoses: same ?Principal Problem: ?  Infection of graft ([place]) ?Active Problems: ?  Hypokalemia ?  ESRD (end stage renal disease) ([place]) ?  Acute respiratory failure ([place]) ?  Difficult airway ? ? ?Discharged Condition: good ? ?Hospital Course: 66 year old woman who presented for infected left upper extremity AV graft. 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  Given steroids for possible airway edema. Successfully extubated 4/2.  ? ?Today ambulated well with PT with no deficits.  States she is ready to go home ? ?Consults: pulmonary/intensive care ? ?Significant Diagnostic Studies: none ? ?Treatments: mechanical ventilation ? ?Discharge Exam: ?Blood pressure (!) 102/53, pulse 95, temperature 97.9 ?F (36.6 ?C), temperature source Oral, resp. rate 17, height 5' 2" (1.575 m), weight 39.4 kg, SpO2 97 %. ?Frail woman who is extubated with no neurological deficits. There is no stridor and the trachea is midline. Chest clear with no respiratory distress. AV graft site clean on left side with staple in place . Right functioning graft.  ? ?Disposition: discharge home with follow-up in scheduled HD as outpatient. Follow-up in 2 weeks with [facility] (dr Donzetta Matters) for staple removal. Follow up with PCP as needed.  ? ? ?Allergies as of 12/24/2021   ? ?   Reactions  ? Hydrochlorothiazide Itching, Other (See Comments)  ? Hyponatremia  ? Penicillins Other (See Comments)  ? "takes all of her hair out" ?.Has patient had a PCN reaction causing immediate rash, facial/tongue/throat swelling, SOB or lightheadedness with hypotension: No ?Has patient had a PCN reaction causing severe rash involving mucus membranes or skin necrosis: No ?Has patient had a PCN reaction that required  hospitalization No ?Has patient had a PCN reaction occurring within the last 10 years: No ?If all of the above answers are "NO", then may proceed with Cephalosporin use.  ? ?  ? ?  ?Medication List  ?  ? ?TAKE these medications   ? ?Accu-Chek Aviva Plus w/Device Kit ?Use to test blood glucose  3 times daily ?  ?Accu-Chek FastClix Lancets Misc ?5 (FIVE) TIMES DAILY DIAGNOSIS CODE E11.9 ?  ?Accu-Chek Guide test strip ?Generic drug: glucose blood ?TEST 5 (FIVE) TIMES DAILY DIAGNOSIS CODE E11.9 ?  ?acetaminophen 325 MG tablet ?Commonly known as: TYLENOL ?Take 2 tablets (650 mg total) by mouth every 4 (four) hours as needed for mild pain (temp > 101.5). ?  ?atorvastatin 80 MG tablet ?Commonly known as: Lipitor ?Take 1 tablet (80 mg total) by mouth daily. ?  ?B-D UF III MINI PEN NEEDLES 31G X 5 MM Misc ?Generic drug: Insulin Pen Needle ?USE TO INJECT NOVOLOG 3 TIMES A DAY. DIAGNOSIS CODE E11.9, Z79.4 ?  ?calcium-vitamin D 500-400 MG-UNIT tablet ?Commonly known as: Calcium 500+D High Potency ?Take 1 tablet by mouth daily. ?  ?cloNIDine 0.2 mg/24hr patch ?Commonly known as: CATAPRES - Dosed in mg/24 hr ?PLACE 1 PATCH (0.2 MG TOTAL) ONTO THE SKIN ONCE A WEEK. ?What changed: additional instructions ?  ?[employer] D3 50 MCG (2000 UT) Caps ?Generic drug: Cholecalciferol ?TAKE 1 CAPSULE BY MOUTH EVERY DAY ?  ?fluticasone 44 MCG/ACT inhaler ?Commonly known as: FLOVENT HFA ?Inhale 2 puffs into the lungs daily. ?  ?hydrALAZINE 50 MG tablet ?Commonly known as: APRESOLINE ?TAKE 1 TABLET BY MOUTH TWICE A DAY ?What changed: when to  take this ?  ?HYDROcodone-acetaminophen 5-325 MG tablet ?Commonly known as: Norco ?Take 1 tablet by mouth every 6 (six) hours as needed for moderate pain. ?  ?insulin glargine 100 UNIT/ML injection ?Commonly known as: Lantus ?Inject 0.34 mL into the skin once daily ?What changed:  ?how much to take ?how to take this ?when to take this ?additional instructions ?  ?Insulin Syringe-Needle U-100 31G X 15/64" 0.3 ML  Misc ?USE TO INJECT LANTUS INSULIN TWO TIMES A DAY ?  ?lidocaine-prilocaine cream ?Commonly known as: EMLA ?Apply 1 application. topically as needed (dialysis). ?  ?loratadine 10 MG tablet ?Commonly known as: Claritin ?Take 1 tablet (10 mg total) by mouth daily. ?  ?NovoLOG FlexPen 100 UNIT/ML FlexPen ?Generic drug: insulin aspart ?Inject 5 Units into the skin 3 (three) times daily with meals. ?  ?omeprazole 20 MG capsule ?Commonly known as: PRILOSEC ?TAKE 1 CAPSULE BY MOUTH EVERY DAY ?  ?sevelamer carbonate 800 MG tablet ?Commonly known as: RENVELA ?Take 800 mg by mouth 3 (three) times daily with meals. ?  ?SYSTANE OP ?Place 1 drop into both eyes daily as needed (for dry eyes). ?  ?verapamil 180 MG CR tablet ?Commonly known as: CALAN-SR ?TAKE 2 TABLETS (360 MG TOTAL) BY MOUTH AT BEDTIME. ?  ? ?  ? ? Follow-up Information   ? ? [facility] Follow up in 2 week(s).   ?Specialty: Vascular Surgery ?Why: Office will call you to arrange your appt (sent) ?Contact information: ?[address] ?[place] [address] ?[phone] ? ?  ?  ? ?  ?  ? ?  ? ? ?Signed: ?Ravi Agarwala ?12/24/2021, 1:33 PM ? ? ?

## 2021-12-24 NOTE — Progress Notes (Signed)
?  12/24/21 1225  ?Clinical Encounter Type  ?Visited With Patient and family together ?(Patient's Daughter)  ?Visit Type Initial;Critical Care ?(Advance Directive Education)  ?Referral From Nurse ?(Sallee Provencal, RN)  ?Consult/Referral To Chaplain  ?Advance Directives (For Healthcare)  ?Does Patient Have a Medical Advance Directive? No  ?Mental Health Advance Directives  ?Does Patient Have a Mental Health Advance Directive? No  ? ?Chaplain Susan Evans met with [name] at bedside regarding the completion of Advance Directive. Patient acknowledged that preparing A.D. was something important that she would like to have completed. Chaplain explained that A.D. is only used when the patient cannot make medical decisions about medical care for herself, giving instruction to the doctors and her agents of how she wishes to be cared for.  ? ?Patient stated after careful thought that she plans to name her daughter as her Medical Power of Attorney.   ? ?Chaplain explained the importance of having conversation with her daughter prior to naming her as her agent.   ? ?Chaplain provided patient a blank copy and provided patient instruction for the completion of Parts A and B of the A.D. Chaplain instructed patient to have the nurse contact [facility] when she has completed Parts A and B, so that we can arrange for Notary and witnesses to assist with completion of Part C.   ? ? Patient was appreciative of the consultation. Chaplain will follow up with patient to arrange for witnesses and Notary assistance. [place], M. [place]. may be contacted at [phone] for further assistance.  ?

## 2021-12-24 NOTE — Progress Notes (Signed)
[facility] ?Progress Note ? ?Subjective: pt seen in ICU.  Up in chair, family at bedside. No new c/o's.  ? ?Vitals:  ? 12/24/21 0901 12/24/21 1000 12/24/21 1100 12/24/21 1152  ?BP: (!) 131/58 124/73 (!) 102/53   ?Pulse:  96 95   ?Resp:  14 17   ?Temp:    97.9 ?F (36.6 ?C)  ?TempSrc:    Oral  ?SpO2:  93% 97%   ?Weight:      ?Height:      ? ? ?Exam: ? alert, nad , elderly  ? no jvd ? Chest cta bilat ? Cor reg no RG ? Abd soft ntnd no ascites ?  Ext no LE edema ?  Alert, NF ?  RUE AVF +b/t ? ? ? ? ? OP HD: [place] [facility] ?  4h  350/500  41.4kg  3K/2.5 bath cath  AVF RUE  15g  ? - mircera 30 q 4wks, last 3/23 ? - hectorol 1 ug tiw IV w/ HD ? ? ?Assessment/ Plan: ?Post op ventilator management/subglottic edema: Unable to be intubated post op, due to prior laryngeal cancer/ hx radiation Rx. She was given steroids here and extubated on 4/2.  ?Exposed LUE AVG: S/p graft excision 3/31, fortunately no evidence of infection on exam.   ? ESRD: usual HD TTS. Had HD here 4/2 in place of 4/1 missed HD. Will resume her usual OP HD tomorrow (TTS).   ? Hypertension/volume: BP reasonable, no volume overload on exam. About 2kg under. Consider lower edw 1kg at dc.  ? Anemia: Hgb 9.6. ESA recently dosed, will continue monitor trend outpatient and increase ESA as needed.  ? Metabolic bone disease: Calcium controlled. Continue hectorol ? Nutrition:  Currently NPO ?Dispo - going home today ? ? ? ? ?Rob Yaasir Menken ?12/24/2021, 3:06 PM ? ? ?Recent Labs  ?Lab 12/23/21 ?2344 12/24/21 ?3016  ?HGB 11.4* 10.1*  ?ALBUMIN 3.7  --   ?CALCIUM 9.0 8.3*  ?PHOS 2.4*  --   ?CREATININE 1.53* 2.90*  ?K 3.3* 3.2*  ? ?Inpatient medications: ? Chlorhexidine Gluconate Cloth  6 each Topical Q0600  ? heparin injection (subcutaneous)  5,000 Units Subcutaneous Q8H  ? hydrALAZINE  50 mg Oral BID  ? insulin aspart  0-9 Units Subcutaneous Q4H  ? pantoprazole (PROTONIX) IV  40 mg Intravenous Q24H  ? polyethylene glycol  17 g Oral Daily  ? ? lactated ringers  Stopped (12/23/21 1539)  ? ?acetaminophen, docusate sodium, labetalol, lidocaine-prilocaine, lip balm, ondansetron (ZOFRAN) IV, polyethylene glycol ? ? ? ? ? ? ?

## 2021-12-24 NOTE — Evaluation (Signed)
Physical Therapy Evaluation ?Patient Details ?Name: Susan Evans ?MRN: [id number] ?DOB: [birth year] ?Today's Date: 12/24/2021 ? ?History of Present Illness ? 66 yo female presenting on 3/31 for excision of LUE AV fistula that was infected. At start of case, unable to place LMA x 2,   and difficult airway. Intubated 3/31-4/2. 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  ?Clinical Impression ? Patient is from home alone and reports being Mod I for ADLs/IADLs at baseline. Sister helps with driving/food etc and is worried about the amount of stairs pt has to climb to get to laundry room. Pt reports some difficulties negotiating stairs. Encouraged having b/f assist with doing laundry. Today, pt tolerated transfers and ambulation with Min guard assist progressing to supervision for safety. Slow processing noted. Pt likely close to functional/cognitive baseline and does not require skilled therapy services. Pt BP is elevated, RN aware. Discharge from therapy. ?   ? ?Recommendations for follow up therapy are one component of a multi-disciplinary discharge planning process, led by the attending physician.  Recommendations may be updated based on patient status, additional functional criteria and insurance authorization. ? ?Follow Up Recommendations No PT follow up ? ?  ?Assistance Recommended at Discharge PRN  ?Patient can return home with the following ? Help with stairs or ramp for entrance;Assist for transportation;Assistance with cooking/housework ? ?  ?Equipment Recommendations None recommended by PT  ?Recommendations for Other Services ?    ?  ?Functional Status Assessment Patient has not had a recent decline in their functional status  ? ?  ?Precautions / Restrictions Precautions ?Precautions: Other (comment) ?Precaution Comments: watch BP- elevated ?Restrictions ?Weight Bearing Restrictions: No  ? ?  ? ?Mobility ? Bed Mobility ?  ?  ?  ?  ?  ?  ?  ?General  bed mobility comments: Sitting in recliner up PT arrival. ?  ? ?Transfers ?Overall transfer level: Needs assistance ?Equipment used: None ?Transfers: Sit to/from Stand ?Sit to Stand: Min guard ?  ?  ?  ?  ?  ?General transfer comment: MIn Guard A for safety, stood from chair x1. ?  ? ?Ambulation/Gait ?Ambulation/Gait assistance: Min guard ?Gait Distance (Feet): 295 Feet ?Assistive device: None ?Gait Pattern/deviations: Step-through pattern, Decreased stride length ?  ?Gait velocity interpretation: <1.31 ft/sec, indicative of household ambulator ?  ?General Gait Details: Slow, steady gait. VSS on RA. ? ?Stairs ?  ?  ?  ?  ?  ? ?Wheelchair Mobility ?  ? ?Modified Rankin (Stroke Patients Only) ?  ? ?  ? ?Balance Overall balance assessment: Mild deficits observed, not formally tested ?  ?  ?  ?  ?  ?  ?  ?  ?  ?  ?  ?  ?  ?  ?  ?  ?  ?  ?   ? ? ? ?Pertinent Vitals/Pain Pain Assessment ?Pain Assessment: Faces ?Faces Pain Scale: Hurts a little bit ?Pain Location: headache ?Pain Descriptors / Indicators: Headache ?Pain Intervention(s): Monitored during session, Patient requesting pain meds-RN notified  ? ? ?Home Living Family/patient expects to be discharged to:: Private residence ?Living Arrangements: Alone (has b/f come over daily) ?Available Help at Discharge: Family;Available PRN/intermittently ?Type of Home: Apartment ?Home Access: Level entry ?  ?  ?  ?Home Layout: One level ?Home Equipment: None ?Additional Comments: Has 2 flights to get to laundry room with heavy door to get in  ?  ?Prior Function Prior Level  of Function : Independent/Modified Independent ?  ?  ?  ?  ?  ?  ?Mobility Comments: Does not drive. Sister drives. Cooks. no falls reported ?ADLs Comments: Independent ?  ? ? ?Hand Dominance  ? Dominant Hand: Right ? ?  ?Extremity/Trunk Assessment  ? Upper Extremity Assessment ?Upper Extremity Assessment: Defer to OT evaluation ?LUE Deficits / Details: Edema and decreased ROM. Swelling at hand and elbow. ?LUE  Coordination: decreased fine motor;decreased gross motor ?  ? ?Lower Extremity Assessment ?Lower Extremity Assessment: Overall WFL for tasks assessed ?  ? ?Cervical / Trunk Assessment ?Cervical / Trunk Assessment: Kyphotic  ?Communication  ? Communication: No difficulties  ?Cognition Arousal/Alertness: Awake/alert ?Behavior During Therapy: [facility] for tasks assessed/performed ?Overall Cognitive Status: Within Functional Limits for tasks assessed ?  ?  ?  ?  ?  ?  ?  ?  ?  ?  ?  ?  ?  ?  ?  ?  ?General Comments: Requiring inceased time for procressing but feel she is at baseline for cognition. ?  ?  ? ?  ?General Comments General comments (skin integrity, edema, etc.): VSS on RA with Hr ranging from 100-113 bpm. BP elevated, RN prsent and aware. ? ?  ?Exercises    ? ?Assessment/Plan  ?  ?PT Assessment Patient does not need any further PT services  ?PT Problem List   ? ?   ?  ?PT Treatment Interventions     ? ?PT Goals (Current goals can be found in the Care Plan section)  ?Acute Rehab PT Goals ?Patient Stated Goal: to go home ?PT Goal Formulation: All assessment and education complete, DC therapy ? ?  ?Frequency   ?  ? ? ?Co-evaluation   ?  ?  ?  ?  ? ? ?  ?AM-PAC PT "6 Clicks" Mobility  ?Outcome Measure Help needed turning from your back to your side while in a flat bed without using bedrails?: None ?Help needed moving from lying on your back to sitting on the side of a flat bed without using bedrails?: None ?Help needed moving to and from a bed to a chair (including a wheelchair)?: A Little ?Help needed standing up from a chair using your arms (e.g., wheelchair or bedside chair)?: A Little ?Help needed to walk in hospital room?: A Little ?Help needed climbing 3-5 steps with a railing? : A Little ?6 Click Score: 20 ? ?  ?End of Session Equipment Utilized During Treatment: Gait belt ?Activity Tolerance: Patient tolerated treatment well ?Patient left: in chair;with call bell/phone within reach;with nursing/sitter in  room ?Nurse Communication: Mobility status;Patient requests pain meds (headache) ?PT Visit Diagnosis: Other abnormalities of gait and mobility (R26.89);Pain ?Pain - part of body:  (head) ?  ? ?Time: 2836-6294 ?PT Time Calculation (min) (ACUTE ONLY): 18 min ? ? ?Charges:   PT Evaluation ?$PT Eval Moderate Complexity: 1 Mod ?  ?  ?   ? ? ?Marisa Severin, PT, DPT ?Acute Rehabilitation Services ?Secure chat preferred ?Office [phone] ? ? ? ? ?[place] ?12/24/2021, 10:26 AM ? ?

## 2021-12-24 NOTE — Progress Notes (Addendum)
? ?NAME:  Susan Evans, MRN:  [id number], DOB:  17-Jan-1956, LOS: 3 ?ADMISSION DATE:  12/21/2021, CONSULTATION DATE:  12/21/21 ?REFERRING MD:  Donzetta Matters - [facility], CHIEF COMPLAINT: difficult airway /  subglottic stenosis  ? 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 At start of case, unable to place LMA x 2,  so case converted to general. Difficulty visualizing airway, required glide for ultimate placement of 6.5 ETT. Decadron given for degree of edema, and noted to have friable appearing tissue.  ? ?PCCM is consulted periop for admission and management in this setting.  ? ?Pertinent  Medical History  ?ESRD ?Oropharyngeal carcinoma ?HTN ?DM2 ? ?Significant Hospital Events: ?Including procedures, antibiotic start and stop dates in addition to other pertinent events   ?3/31 OR for excision of infected LUE AVG (outpatient surgery). Unforseen difficult airway, PCCM requested to admit ?4/3 Extubated yesterday, stable on [place] ? ?Interim History / Subjective:  ? ?Improving, sitting up in the chair on [place], no complaints ? ?Objective   ?Blood pressure (!) 145/96, pulse (!) 104, temperature 98.2 ?F (36.8 ?C), temperature source Oral, resp. rate 13, height 5' 2"  (1.575 m), weight 39.4 kg, SpO2 97 %. ?   ?FiO2 (%):  [30 %] 30 %  ? ?Intake/Output Summary (Last 24 hours) at 12/24/2021 0857 ?Last data filed at 12/24/2021 0700 ?Gross per 24 hour  ?Intake 578.31 ml  ?Output 2250 ml  ?Net -1671.69 ml  ? ? ?Filed Weights  ? 12/23/21 0410 12/24/21 0600  ?Weight: 43.3 kg 39.4 kg  ? ? ?General:  frail, chronically ill-appearing F sitting up in the chair in no acute distress ?HEENT: MM pink/moist, sclera anicteric, no stridor ?Neuro: awake, alert, conversational and following commands ?CV: s1s2 rrr, no m/r/g ?PULM:  clear bilaterally without rhonchi or wheezing  ?GI: soft, bsx4 active  ?Extremities:  warm/dry, minimal muscle bulk, no edema ?Skin: no rashes or lesions ? ? ?Resolved Hospital Problem list   ?Hypoxic respiratory failure ? ?Assessment & Plan:  ? ?Post operative ventilator management  ?Subglottic edema / Difficult airway ?Hx oropharyngeal carcinoma  ?Initially unable to place LMA, multiple intubation attempts, 6.5 placed with glide. Very stiff neck. Tissue noted to be friable and edematous  ?-Pt successfully extubated yesterday ?-48 hrs Decadron completed ?- Surgical airway kit in the room as back up ?-working with PT today ?-stable for transfer out of ICU ? ?ESRD ?Exposed LUE AVG (POD0 excision 3/31) ?Hypokalemia ?-there was concern for infected graft but note that Op note w/ healthy tissue and was not infected appearing.  ?P ?-post op per [facility]  ?-Hd per nephro  ?-monitor electrolytes and replete as needed ? ?HTN ?-hold home meds  ? ?DM2 ?-SSI  ? ?Best Practice (right click and "Reselect all SmartList Selections" daily)  ? ?Diet/type: dysphagia diet (see orders) ?DVT prophylaxis: prophylactic heparin  ?GI prophylaxis: PPI ?Lines: N/A ?Foley:  N/A ?Code Status:  full code ?Last date of multidisciplinary goals of care discussion [ daughter updated at bedside on 3/31, pt updated on plan to transfer out of ICU 4/3] ? ?Labs   ?CBC: ?Recent Labs  ?Lab 12/21/21 ?1937 12/22/21 ?0455 12/23/21 ?9528 12/23/21 ?2344 12/24/21 ?4132  ?WBC 8.9 5.7 12.1* 23.4* 20.4*  ?HGB 10.6* 9.8* 9.6* 11.4* 10.1*  ?HCT 33.4* 30.6* 31.0* 34.9* 31.1*  ?MCV 77.3* 75.6* 76.4* 73.5* 75.1*  ?PLT 202 262  292 354 311  ? ? ? ?Basic Metabolic Panel: ?Recent Labs  ?Lab 12/21/21 ?1937 12/22/21 ?0455 12/23/21 ?8502 12/23/21 ?2344 12/24/21 ?7741  ?NA 132* 134* 135 134* 136  ?K 5.4* 3.6 3.6 3.3* 3.2*  ?CL 96* 98 100 95* 99  ?CO2 19* 22 17* 24 23  ?GLUCOSE 168* 101* 165* 158* 165*  ?BUN 28* 31* 48* 8 20  ?CREATININE 4.60* 4.71* 5.74* 1.53* 2.90*  ?CALCIUM 8.5* 8.6* 8.4* 9.0 8.3*  ?MG  --   --  2.0  --  1.9  ?PHOS  --   --   --  2.4*  --    ? ? ?GFR: ?Estimated Creatinine Clearance: 12 mL/min (A) (by C-G formula based on SCr of 2.9 mg/dL (H)). ?Recent Labs  ?Lab 12/22/21 ?0455 12/23/21 ?2878 12/23/21 ?2344 12/24/21 ?6767  ?WBC 5.7 12.1* 23.4* 20.4*  ? ? ? ?Liver Function Tests: ?Recent Labs  ?Lab 12/23/21 ?2344  ?ALBUMIN 3.7  ? ?No results for input(s): LIPASE, AMYLASE in the last 168 hours. ?No results for input(s): AMMONIA in the last 168 hours. ? ?ABG ?   ?Component Value Date/Time  ? PHART 7.487 (H) 12/21/2021 1705  ? PCO2ART 33.8 12/21/2021 1705  ? PO2ART 179 (H) 12/21/2021 1705  ? HCO3 25.6 12/21/2021 1705  ? TCO2 27 12/21/2021 1705  ? O2SAT 100 12/21/2021 1705  ? ?  ? ?Coagulation Profile: ?No results for input(s): INR, PROTIME in the last 168 hours. ? ?Cardiac Enzymes: ?No results for input(s): CKTOTAL, CKMB, CKMBINDEX, TROPONINI in the last 168 hours. ? ?HbA1C: ?Hemoglobin A1C  ?Date/Time Value Ref Range Status  ?08/31/2021 10:16 AM 9.7 (A) 4.0 - 5.6 % Final  ?04/13/2021 11:19 AM 8.1 (A) 4.0 - 5.6 % Final  ? ?Hgb A1c MFr Bld  ?Date/Time Value Ref Range Status  ?12/21/2021 07:37 PM 6.8 (H) 4.8 - 5.6 % Final  ?  Comment:  ?  (NOTE) ?Pre diabetes:          5.7%-6.4% ? ?Diabetes:              >6.4% ? ?Glycemic control for   <7.0% ?adults with diabetes ?  ?08/06/2010 02:27 PM 8.8 %   ? ? ?CBG: ?Recent Labs  ?Lab 12/23/21 ?1947 12/24/21 ?0000 12/24/21 ?2094 12/24/21 ?7096 12/24/21 ?2836  ?GLUCAP 240* 179* 84 91 135*  ? ? ?Critical care time:  ?  ? ? ? ?Otilio Carpen Zakiah Beckerman, PA-C ?[facility] ?See Amion for pager ?If no response to pager , please call [phone] until 7pm ?After 7:00 pm call Elink  [phone] ? ? ? ? ? ? ? ?

## 2021-12-25 DIAGNOSIS — T82591A Other mechanical complication of surgically created arteriovenous shunt, initial encounter: Secondary | ICD-10-CM | POA: Insufficient documentation

## 2021-12-25 LAB — HEPATITIS B SURFACE ANTIBODY, QUANTITATIVE: Hep B S AB Quant (Post): 873.8 m[IU]/mL (ref >9.9)

## 2021-12-26 ENCOUNTER — Other Ambulatory Visit: Payer: Self-pay | Admitting: Internal Medicine

## 2021-12-26 DIAGNOSIS — I10 Essential (primary) hypertension: Secondary | ICD-10-CM

## 2022-01-15 NOTE — Progress Notes (Deleted)
POST OPERATIVE OFFICE NOTE    CC:  F/u for surgery  HPI:  This is a 66 y.o. female who is  s/p:Excision of left arm AV graft  that is no longer functional but has eroded through the skin.  She is here today for incision check.   Pt returns today for follow up.  Pt states    Allergies  Allergen Reactions   Hydrochlorothiazide Itching and Other (See Comments)    Hyponatremia    Penicillins Other (See Comments)    "takes all of her hair out" .Has patient had a PCN reaction causing immediate rash, facial/tongue/throat swelling, SOB or lightheadedness with hypotension: No Has patient had a PCN reaction causing severe rash involving mucus membranes or skin necrosis: No Has patient had a PCN reaction that required hospitalization No Has patient had a PCN reaction occurring within the last 10 years: No If all of the above answers are "NO", then may proceed with Cephalosporin use.     Current Outpatient Medications  Medication Sig Dispense Refill   Accu-Chek FastClix Lancets MISC 5 (FIVE) TIMES DAILY DIAGNOSIS CODE E11.9 204 each 8   acetaminophen (TYLENOL) 325 MG tablet Take 2 tablets (650 mg total) by mouth every 4 (four) hours as needed for mild pain (temp > 101.5).     atorvastatin (LIPITOR) 80 MG tablet Take 1 tablet (80 mg total) by mouth daily. 30 tablet 11   B-D UF III MINI PEN NEEDLES 31G X 5 MM MISC USE TO INJECT NOVOLOG 3 TIMES A DAY. DIAGNOSIS CODE E11.9, Z79.4 100 each 8   Blood Glucose Monitoring Suppl (ACCU-CHEK AVIVA PLUS) w/Device KIT Use to test blood glucose  3 times daily 1 kit 0   calcium-vitamin D (CALCIUM 500+D HIGH POTENCY) 500-400 MG-UNIT tablet Take 1 tablet by mouth daily. 90 tablet 3   cloNIDine (CATAPRES - DOSED IN MG/24 HR) 0.2 mg/24hr patch PLACE 1 PATCH (0.2 MG TOTAL) ONTO THE SKIN ONCE A WEEK. (Patient taking differently: Place 0.2 mg onto the skin once a week. Changes patch on Monday) 4 patch 3   [employer] D3 50 MCG (2000 UT) CAPS TAKE 1 CAPSULE BY MOUTH EVERY  DAY (Patient not taking: Reported on 12/19/2021) 90 capsule 3   fluticasone (FLOVENT HFA) 44 MCG/ACT inhaler Inhale 2 puffs into the lungs daily. 10.6 each 3   glucose blood (ACCU-CHEK GUIDE) test strip TEST 5 (FIVE) TIMES DAILY DIAGNOSIS CODE E11.9 150 strip 12   hydrALAZINE (APRESOLINE) 50 MG tablet TAKE 1 TABLET BY MOUTH TWICE A DAY 60 tablet 3   HYDROcodone-acetaminophen (NORCO) 5-325 MG tablet Take 1 tablet by mouth every 6 (six) hours as needed for moderate pain. (Patient not taking: Reported on 12/19/2021) 10 tablet 0   insulin aspart (NOVOLOG FLEXPEN) 100 UNIT/ML FlexPen Inject 5 Units into the skin 3 (three) times daily with meals.     insulin glargine (LANTUS) 100 UNIT/ML injection Inject 0.34 mL into the skin once daily (Patient taking differently: Inject 20 Units into the skin 2 (two) times daily.) 38 mL 4   Insulin Syringe-Needle U-100 31G X 15/64" 0.3 ML MISC USE TO INJECT LANTUS INSULIN TWO TIMES A DAY 180 each 3   lidocaine-prilocaine (EMLA) cream Apply 1 application. topically as needed (dialysis).     loratadine (CLARITIN) 10 MG tablet Take 1 tablet (10 mg total) by mouth daily. 30 tablet 2   omeprazole (PRILOSEC) 20 MG capsule TAKE 1 CAPSULE BY MOUTH EVERY DAY 90 capsule 2   Polyethyl Glycol-Propyl Glycol (  SYSTANE OP) Place 1 drop into both eyes daily as needed (for dry eyes).     sevelamer carbonate (RENVELA) 800 MG tablet Take 800 mg by mouth 3 (three) times daily with meals.     verapamil (CALAN-SR) 180 MG CR tablet TAKE 2 TABLETS (360 MG TOTAL) BY MOUTH AT BEDTIME. 60 tablet 3   No current facility-administered medications for this visit.     ROS:  See HPI  Physical Exam:  ***  Incision:  *** Extremities:  *** Neuro: *** Abdomen:  ***    Assessment/Plan:  This is a 66 y.o. female who is s/p:Excision of left arm AV graft  that is no longer functional but has eroded through the skin. She is on HD via Right arm brachial artery to cephalic vein AV fistula created  12/27/20 by Dr. Donzetta Matters.  ***  -***   Leontine Locket, [facility] [phone]   Clinic MD:  ***

## 2022-02-06 ENCOUNTER — Ambulatory Visit (INDEPENDENT_AMBULATORY_CARE_PROVIDER_SITE_OTHER): Payer: Medicare Other | Admitting: Physician Assistant

## 2022-02-06 VITALS — BP 201/105 | HR 106 | Temp 98.1°F | Resp 20 | Ht 62.0 in | Wt 88.6 lb

## 2022-02-06 DIAGNOSIS — H9190 Unspecified hearing loss, unspecified ear: Secondary | ICD-10-CM | POA: Insufficient documentation

## 2022-02-06 DIAGNOSIS — N186 End stage renal disease: Secondary | ICD-10-CM

## 2022-02-06 NOTE — Progress Notes (Signed)
?POST OPERATIVE OFFICE NOTE ? ? ? ?CC:  F/u for surgery ? ?HPI:  This is a 66 y.o. female who is s/p excision of left arm abandoned AV graft due to erosion of graft material through the skin.  She is currently dialyzing via right arm AV fistula without any complication on a Tuesday Thursday Saturday schedule.  She states her left arm incision has completely healed.  She is here for staple removal. ? ?Allergies  ?Allergen Reactions  ? Hydrochlorothiazide Itching and Other (See Comments)  ?  Hyponatremia ?  ? Penicillins Other (See Comments)  ?  "takes all of her hair out" ?.Has patient had a PCN reaction causing immediate rash, facial/tongue/throat swelling, SOB or lightheadedness with hypotension: No ?Has patient had a PCN reaction causing severe rash involving mucus membranes or skin necrosis: No ?Has patient had a PCN reaction that required hospitalization No ?Has patient had a PCN reaction occurring within the last 10 years: No ?If all of the above answers are "NO", then may proceed with Cephalosporin use. ?  ? ? ?Current Outpatient Medications  ?Medication Sig Dispense Refill  ? Accu-Chek FastClix Lancets MISC 5 (FIVE) TIMES DAILY DIAGNOSIS CODE E11.9 204 each 8  ? acetaminophen (TYLENOL) 325 MG tablet Take 2 tablets (650 mg total) by mouth every 4 (four) hours as needed for mild pain (temp > 101.5).    ? atorvastatin (LIPITOR) 80 MG tablet Take 1 tablet (80 mg total) by mouth daily. 30 tablet 11  ? B-D UF III MINI PEN NEEDLES 31G X 5 MM MISC USE TO INJECT NOVOLOG 3 TIMES A DAY. DIAGNOSIS CODE E11.9, Z79.4 100 each 8  ? Blood Glucose Monitoring Suppl (ACCU-CHEK AVIVA PLUS) w/Device KIT Use to test blood glucose  3 times daily 1 kit 0  ? calcium-vitamin D (CALCIUM 500+D HIGH POTENCY) 500-400 MG-UNIT tablet Take 1 tablet by mouth daily. 90 tablet 3  ? cloNIDine (CATAPRES - DOSED IN MG/24 HR) 0.2 mg/24hr patch PLACE 1 PATCH (0.2 MG TOTAL) ONTO THE SKIN ONCE A WEEK. (Patient taking differently: Place 0.2 mg onto  the skin once a week. Changes patch on Monday) 4 patch 3  ? CVS D3 50 MCG (2000 UT) CAPS TAKE 1 CAPSULE BY MOUTH EVERY DAY 90 capsule 3  ? fluticasone (FLOVENT HFA) 44 MCG/ACT inhaler Inhale 2 puffs into the lungs daily. 10.6 each 3  ? glucose blood (ACCU-CHEK GUIDE) test strip TEST 5 (FIVE) TIMES DAILY DIAGNOSIS CODE E11.9 150 strip 12  ? hydrALAZINE (APRESOLINE) 50 MG tablet TAKE 1 TABLET BY MOUTH TWICE A DAY 60 tablet 3  ? HYDROcodone-acetaminophen (NORCO) 5-325 MG tablet Take 1 tablet by mouth every 6 (six) hours as needed for moderate pain. 10 tablet 0  ? insulin aspart (NOVOLOG FLEXPEN) 100 UNIT/ML FlexPen Inject 5 Units into the skin 3 (three) times daily with meals.    ? insulin glargine (LANTUS) 100 UNIT/ML injection Inject 0.34 mL into the skin once daily (Patient taking differently: Inject 20 Units into the skin 2 (two) times daily.) 38 mL 4  ? Insulin Syringe-Needle U-100 31G X 15/64" 0.3 ML MISC USE TO INJECT LANTUS INSULIN TWO TIMES A DAY 180 each 3  ? lidocaine-prilocaine (EMLA) cream Apply 1 application. topically as needed (dialysis).    ? loratadine (CLARITIN) 10 MG tablet Take 1 tablet (10 mg total) by mouth daily. 30 tablet 2  ? omeprazole (PRILOSEC) 20 MG capsule TAKE 1 CAPSULE BY MOUTH EVERY DAY 90 capsule 2  ? Polyethyl Glycol-Propyl  Glycol (SYSTANE OP) Place 1 drop into both eyes daily as needed (for dry eyes).    ? sevelamer carbonate (RENVELA) 800 MG tablet Take 800 mg by mouth 3 (three) times daily with meals.    ? verapamil (CALAN-SR) 180 MG CR tablet TAKE 2 TABLETS (360 MG TOTAL) BY MOUTH AT BEDTIME. 60 tablet 3  ? ?No current facility-administered medications for this visit.  ? ? ? ROS:  See HPI ? ?Physical Exam: ? ?Vitals:  ? 02/06/22 0923  ?BP: (!) 201/105  ?Pulse: (!) 106  ?Resp: 20  ?Temp: 98.1 ?F (36.7 ?C)  ?TempSrc: Temporal  ?SpO2: 96%  ?Weight: 88 lb 9.6 oz (40.2 kg)  ?Height: 5' 2"  (1.575 m)  ? ? ?Incision: Left arm incision well-healed ?Extremities: Palpable left radial  pulse ?Neuro: A&O ? ?Assessment/Plan:  This is a 66 y.o. female who is s/p: ?Excision of abandoned left arm AV graft ? ?-Left arm incision completely healed; staples were removed ?-Continue HD via right arm AV fistula ?-Patient can follow-up on an as-needed basis ? ? ?Dagoberto Ligas, PA-C ?[facility] ?[phone] ? ?Clinic MD:  Donzetta Matters ? ?

## 2022-02-18 ENCOUNTER — Other Ambulatory Visit: Payer: Self-pay | Admitting: Internal Medicine

## 2022-02-18 ENCOUNTER — Other Ambulatory Visit: Payer: Self-pay | Admitting: Student

## 2022-02-18 DIAGNOSIS — B9689 Other specified bacterial agents as the cause of diseases classified elsewhere: Secondary | ICD-10-CM

## 2022-02-18 DIAGNOSIS — I10 Essential (primary) hypertension: Secondary | ICD-10-CM

## 2022-02-19 NOTE — Telephone Encounter (Signed)
[place] office - schedule pt a f/u visit. Last OV was 08/2021. Thanks

## 2022-02-21 DIAGNOSIS — N186 End stage renal disease: Secondary | ICD-10-CM | POA: Diagnosis not present

## 2022-02-21 DIAGNOSIS — I129 Hypertensive chronic kidney disease with stage 1 through stage 4 chronic kidney disease, or unspecified chronic kidney disease: Secondary | ICD-10-CM | POA: Diagnosis not present

## 2022-02-21 DIAGNOSIS — E611 Iron deficiency: Secondary | ICD-10-CM | POA: Diagnosis not present

## 2022-02-21 DIAGNOSIS — L299 Pruritus, unspecified: Secondary | ICD-10-CM | POA: Diagnosis not present

## 2022-02-21 DIAGNOSIS — E876 Hypokalemia: Secondary | ICD-10-CM | POA: Diagnosis not present

## 2022-02-21 DIAGNOSIS — N2581 Secondary hyperparathyroidism of renal origin: Secondary | ICD-10-CM | POA: Diagnosis not present

## 2022-02-21 DIAGNOSIS — Z992 Dependence on renal dialysis: Secondary | ICD-10-CM | POA: Diagnosis not present

## 2022-02-21 DIAGNOSIS — D631 Anemia in chronic kidney disease: Secondary | ICD-10-CM | POA: Diagnosis not present

## 2022-02-22 ENCOUNTER — Ambulatory Visit: Payer: Medicare Other | Admitting: Internal Medicine

## 2022-02-22 ENCOUNTER — Telehealth: Payer: Self-pay

## 2022-02-22 NOTE — Telephone Encounter (Signed)
Unable to reach patient for her annual wellness visit today .Marland Kitchen I called her a few times while in the lab on both numbers  and after I completed the rest of my visits and she still did not pick up

## 2022-02-23 DIAGNOSIS — N2581 Secondary hyperparathyroidism of renal origin: Secondary | ICD-10-CM | POA: Diagnosis not present

## 2022-02-23 DIAGNOSIS — E611 Iron deficiency: Secondary | ICD-10-CM | POA: Diagnosis not present

## 2022-02-23 DIAGNOSIS — E876 Hypokalemia: Secondary | ICD-10-CM | POA: Diagnosis not present

## 2022-02-23 DIAGNOSIS — D631 Anemia in chronic kidney disease: Secondary | ICD-10-CM | POA: Diagnosis not present

## 2022-02-23 DIAGNOSIS — L299 Pruritus, unspecified: Secondary | ICD-10-CM | POA: Diagnosis not present

## 2022-02-23 DIAGNOSIS — N186 End stage renal disease: Secondary | ICD-10-CM | POA: Diagnosis not present

## 2022-02-23 DIAGNOSIS — Z992 Dependence on renal dialysis: Secondary | ICD-10-CM | POA: Diagnosis not present

## 2022-02-26 DIAGNOSIS — N186 End stage renal disease: Secondary | ICD-10-CM | POA: Diagnosis not present

## 2022-02-26 DIAGNOSIS — L299 Pruritus, unspecified: Secondary | ICD-10-CM | POA: Diagnosis not present

## 2022-02-26 DIAGNOSIS — N2581 Secondary hyperparathyroidism of renal origin: Secondary | ICD-10-CM | POA: Diagnosis not present

## 2022-02-26 DIAGNOSIS — E611 Iron deficiency: Secondary | ICD-10-CM | POA: Diagnosis not present

## 2022-02-26 DIAGNOSIS — D631 Anemia in chronic kidney disease: Secondary | ICD-10-CM | POA: Diagnosis not present

## 2022-02-26 DIAGNOSIS — E876 Hypokalemia: Secondary | ICD-10-CM | POA: Diagnosis not present

## 2022-02-26 DIAGNOSIS — Z992 Dependence on renal dialysis: Secondary | ICD-10-CM | POA: Diagnosis not present

## 2022-02-28 DIAGNOSIS — L299 Pruritus, unspecified: Secondary | ICD-10-CM | POA: Diagnosis not present

## 2022-02-28 DIAGNOSIS — E611 Iron deficiency: Secondary | ICD-10-CM | POA: Diagnosis not present

## 2022-02-28 DIAGNOSIS — Z992 Dependence on renal dialysis: Secondary | ICD-10-CM | POA: Diagnosis not present

## 2022-02-28 DIAGNOSIS — N186 End stage renal disease: Secondary | ICD-10-CM | POA: Diagnosis not present

## 2022-02-28 DIAGNOSIS — D631 Anemia in chronic kidney disease: Secondary | ICD-10-CM | POA: Diagnosis not present

## 2022-02-28 DIAGNOSIS — N2581 Secondary hyperparathyroidism of renal origin: Secondary | ICD-10-CM | POA: Diagnosis not present

## 2022-02-28 DIAGNOSIS — E876 Hypokalemia: Secondary | ICD-10-CM | POA: Diagnosis not present

## 2022-03-02 DIAGNOSIS — Z992 Dependence on renal dialysis: Secondary | ICD-10-CM | POA: Diagnosis not present

## 2022-03-02 DIAGNOSIS — D631 Anemia in chronic kidney disease: Secondary | ICD-10-CM | POA: Diagnosis not present

## 2022-03-02 DIAGNOSIS — E876 Hypokalemia: Secondary | ICD-10-CM | POA: Diagnosis not present

## 2022-03-02 DIAGNOSIS — E611 Iron deficiency: Secondary | ICD-10-CM | POA: Diagnosis not present

## 2022-03-02 DIAGNOSIS — N186 End stage renal disease: Secondary | ICD-10-CM | POA: Diagnosis not present

## 2022-03-02 DIAGNOSIS — L299 Pruritus, unspecified: Secondary | ICD-10-CM | POA: Diagnosis not present

## 2022-03-02 DIAGNOSIS — N2581 Secondary hyperparathyroidism of renal origin: Secondary | ICD-10-CM | POA: Diagnosis not present

## 2022-03-04 ENCOUNTER — Other Ambulatory Visit: Payer: Self-pay | Admitting: Internal Medicine

## 2022-03-04 ENCOUNTER — Other Ambulatory Visit: Payer: Self-pay | Admitting: Student

## 2022-03-04 DIAGNOSIS — H34211 Partial retinal artery occlusion, right eye: Secondary | ICD-10-CM

## 2022-03-04 DIAGNOSIS — I10 Essential (primary) hypertension: Secondary | ICD-10-CM

## 2022-03-05 DIAGNOSIS — N2581 Secondary hyperparathyroidism of renal origin: Secondary | ICD-10-CM | POA: Diagnosis not present

## 2022-03-05 DIAGNOSIS — Z992 Dependence on renal dialysis: Secondary | ICD-10-CM | POA: Diagnosis not present

## 2022-03-05 DIAGNOSIS — D631 Anemia in chronic kidney disease: Secondary | ICD-10-CM | POA: Diagnosis not present

## 2022-03-05 DIAGNOSIS — N186 End stage renal disease: Secondary | ICD-10-CM | POA: Diagnosis not present

## 2022-03-05 DIAGNOSIS — E876 Hypokalemia: Secondary | ICD-10-CM | POA: Diagnosis not present

## 2022-03-05 DIAGNOSIS — L299 Pruritus, unspecified: Secondary | ICD-10-CM | POA: Diagnosis not present

## 2022-03-05 DIAGNOSIS — E611 Iron deficiency: Secondary | ICD-10-CM | POA: Diagnosis not present

## 2022-03-07 DIAGNOSIS — Z992 Dependence on renal dialysis: Secondary | ICD-10-CM | POA: Diagnosis not present

## 2022-03-07 DIAGNOSIS — D631 Anemia in chronic kidney disease: Secondary | ICD-10-CM | POA: Diagnosis not present

## 2022-03-07 DIAGNOSIS — N186 End stage renal disease: Secondary | ICD-10-CM | POA: Diagnosis not present

## 2022-03-07 DIAGNOSIS — E611 Iron deficiency: Secondary | ICD-10-CM | POA: Diagnosis not present

## 2022-03-07 DIAGNOSIS — N2581 Secondary hyperparathyroidism of renal origin: Secondary | ICD-10-CM | POA: Diagnosis not present

## 2022-03-07 DIAGNOSIS — L299 Pruritus, unspecified: Secondary | ICD-10-CM | POA: Diagnosis not present

## 2022-03-07 DIAGNOSIS — E876 Hypokalemia: Secondary | ICD-10-CM | POA: Diagnosis not present

## 2022-03-09 DIAGNOSIS — L299 Pruritus, unspecified: Secondary | ICD-10-CM | POA: Diagnosis not present

## 2022-03-09 DIAGNOSIS — E611 Iron deficiency: Secondary | ICD-10-CM | POA: Diagnosis not present

## 2022-03-09 DIAGNOSIS — N186 End stage renal disease: Secondary | ICD-10-CM | POA: Diagnosis not present

## 2022-03-09 DIAGNOSIS — E876 Hypokalemia: Secondary | ICD-10-CM | POA: Diagnosis not present

## 2022-03-09 DIAGNOSIS — D631 Anemia in chronic kidney disease: Secondary | ICD-10-CM | POA: Diagnosis not present

## 2022-03-09 DIAGNOSIS — N2581 Secondary hyperparathyroidism of renal origin: Secondary | ICD-10-CM | POA: Diagnosis not present

## 2022-03-09 DIAGNOSIS — Z992 Dependence on renal dialysis: Secondary | ICD-10-CM | POA: Diagnosis not present

## 2022-03-12 DIAGNOSIS — L299 Pruritus, unspecified: Secondary | ICD-10-CM | POA: Diagnosis not present

## 2022-03-12 DIAGNOSIS — N186 End stage renal disease: Secondary | ICD-10-CM | POA: Diagnosis not present

## 2022-03-12 DIAGNOSIS — D631 Anemia in chronic kidney disease: Secondary | ICD-10-CM | POA: Diagnosis not present

## 2022-03-12 DIAGNOSIS — E611 Iron deficiency: Secondary | ICD-10-CM | POA: Diagnosis not present

## 2022-03-12 DIAGNOSIS — E876 Hypokalemia: Secondary | ICD-10-CM | POA: Diagnosis not present

## 2022-03-12 DIAGNOSIS — N2581 Secondary hyperparathyroidism of renal origin: Secondary | ICD-10-CM | POA: Diagnosis not present

## 2022-03-12 DIAGNOSIS — Z992 Dependence on renal dialysis: Secondary | ICD-10-CM | POA: Diagnosis not present

## 2022-03-13 ENCOUNTER — Ambulatory Visit (INDEPENDENT_AMBULATORY_CARE_PROVIDER_SITE_OTHER): Payer: Medicare Other | Admitting: Internal Medicine

## 2022-03-13 VITALS — BP 129/68 | HR 96 | Temp 97.8°F | Ht 62.0 in | Wt 91.7 lb

## 2022-03-13 DIAGNOSIS — Z1231 Encounter for screening mammogram for malignant neoplasm of breast: Secondary | ICD-10-CM | POA: Diagnosis not present

## 2022-03-13 DIAGNOSIS — E1122 Type 2 diabetes mellitus with diabetic chronic kidney disease: Secondary | ICD-10-CM

## 2022-03-13 DIAGNOSIS — Z992 Dependence on renal dialysis: Secondary | ICD-10-CM | POA: Diagnosis not present

## 2022-03-13 DIAGNOSIS — E785 Hyperlipidemia, unspecified: Secondary | ICD-10-CM | POA: Diagnosis not present

## 2022-03-13 DIAGNOSIS — Z87891 Personal history of nicotine dependence: Secondary | ICD-10-CM

## 2022-03-13 DIAGNOSIS — Z794 Long term (current) use of insulin: Secondary | ICD-10-CM

## 2022-03-13 DIAGNOSIS — E119 Type 2 diabetes mellitus without complications: Secondary | ICD-10-CM

## 2022-03-13 DIAGNOSIS — Z0000 Encounter for general adult medical examination without abnormal findings: Secondary | ICD-10-CM | POA: Diagnosis not present

## 2022-03-13 DIAGNOSIS — I120 Hypertensive chronic kidney disease with stage 5 chronic kidney disease or end stage renal disease: Secondary | ICD-10-CM

## 2022-03-13 DIAGNOSIS — E118 Type 2 diabetes mellitus with unspecified complications: Secondary | ICD-10-CM

## 2022-03-13 DIAGNOSIS — N186 End stage renal disease: Secondary | ICD-10-CM | POA: Diagnosis not present

## 2022-03-13 DIAGNOSIS — I10 Essential (primary) hypertension: Secondary | ICD-10-CM

## 2022-03-13 LAB — POCT GLYCOSYLATED HEMOGLOBIN (HGB A1C): Hemoglobin A1C: 7.5 % — AB (ref 4.0–5.6)

## 2022-03-13 LAB — GLUCOSE, CAPILLARY: Glucose-Capillary: 409 mg/dL — ABNORMAL HIGH (ref 70–99)

## 2022-03-13 MED ORDER — CLONIDINE 0.2 MG/24HR TD PTWK: 0.2000 mg | MEDICATED_PATCH | TRANSDERMAL | 3 refills | Status: DC

## 2022-03-13 MED ORDER — INSULIN GLARGINE 100 UNIT/ML ~~LOC~~ SOLN: 30.0000 [IU] | Freq: Two times a day (BID) | SUBCUTANEOUS | 4 refills | Status: DC

## 2022-03-13 MED ORDER — NOVOLOG FLEXPEN 100 UNIT/ML ~~LOC~~ SOPN: PEN_INJECTOR | SUBCUTANEOUS | 2 refills | Status: DC

## 2022-03-13 NOTE — Assessment & Plan Note (Signed)
Maintains good medication compliance to Lipitor 80 mg daily. No adverse effects. Last LDL 92 at goal for primary prevention. Has plenty of refills. Continue medication.

## 2022-03-13 NOTE — Assessment & Plan Note (Signed)
Mammogram ordered today Defers colonoscopy -- removed from [facility] maintenance after shared decision making given her prognosis living with ESRD.

## 2022-03-13 NOTE — Assessment & Plan Note (Signed)
Continues on TTS HD sessions. Reports tolerating it well via right arm AVF. Has not missed any sessions recently. Congratulated patient and advised her to continue with her scheduled sessions.

## 2022-03-13 NOTE — Patient Instructions (Addendum)
Diabetes: Increase Novolog to 13 units with breakfast, 10 units with lunch, and 10 units with dinner.  Continue with Lantus 30 units twice daily  Please continue to monitor your sugars, but also start checking in the morning before meals and bring meter with you during next visit.   I will call with A1c results.   High blood pressure Continue taking your medicaitons   Dialysis Continue going to all dialysis sessions without missing sessions.   Breast cancer screening  I have ordered Mammogram for you.

## 2022-03-13 NOTE — Assessment & Plan Note (Addendum)
A1c up from 6.8 on 12/21/21 to 7.5 today. She reports good medication compliance to Novolog and Lantus. Her regimen includes Novolog 10 units with breakfast given its her largest meal, and 5 units with lunch and dinner, as well as Lantus 30 units BID. She demonstrates good understanding of when to use NovoLog. She reports feeling 1 low which quickly resolved with orange juice. Home glucometer results showing an average if 237 with highest being 499 and low being 80. She has a couple readings in 80's during dinnertime, but mostly >200. Her highest reads are in the morning after breakfast. She has few fasting AM reads showing 180's. Advised to check AM fasting. Suspect dietary indiscretion contributing to uncontrolled DM as she reports drinking Coca-Cola and eating sweets. Discussed lifestyle modifications. Capillary glucose 409 consistent with her home readings. She is completely asymptomatic at this time and denies N/V, confusion or dehydration.   -Increase NovoLog to 13 units with breakfast, and 10 units with lunch and dinner. -Continue Lantus 30 units BID  -A1c in 3 months  -Lifestyle modifications discussed  -Follow-up in 2 weeks with glucometer  -Emphasized the importance of consistent CBG monitoring, including AM fasting

## 2022-03-13 NOTE — Progress Notes (Addendum)
Subjective:   Susan Evans is a 66 y.o. female who presents for an Initial Medicare Annual Wellness Visit. I connected with  [name] on 03/13/22 by a  Face to Face  enabled telemedicine application and verified that I am speaking with the correct person using two identifiers.  Patient Location: Other:  Office/Clinic  Provider Location: Office/Clinic  I discussed the limitations of evaluation and management by telemedicine. The patient expressed understanding and agreed to proceed.  Review of Systems    Defer to PCP       Objective:    There were no vitals filed for this visit. There is no height or weight on file to calculate BMI.     03/13/2022   11:30 AM 12/24/2021   12:25 PM 12/24/2021    9:00 AM 12/23/2021    8:59 AM 12/21/2021    6:22 PM 12/19/2021    4:45 PM 08/31/2021   10:11 AM  Advanced Directives  Does Patient Have a Medical Advance Directive? No No   No No No  Would patient like information on creating a medical advance directive? No - Patient declined  Yes (Inpatient - patient requests chaplain consult to create a medical advance directive) No - Patient declined   No - Patient declined    Current Medications (verified) Outpatient Encounter Medications as of 03/13/2022  Medication Sig   Accu-Chek FastClix Lancets MISC 5 (FIVE) TIMES DAILY DIAGNOSIS CODE E11.9   acetaminophen (TYLENOL) 325 MG tablet Take 2 tablets (650 mg total) by mouth every 4 (four) hours as needed for mild pain (temp > 101.5).   atorvastatin (LIPITOR) 80 MG tablet TAKE 1 TABLET BY MOUTH EVERY DAY   B-D UF III MINI PEN NEEDLES 31G X 5 MM MISC USE TO INJECT NOVOLOG 3 TIMES A DAY. DIAGNOSIS CODE E11.9, Z79.4   Blood Glucose Monitoring Suppl (ACCU-CHEK AVIVA PLUS) w/Device KIT Use to test blood glucose  3 times daily   calcium-vitamin D (CALCIUM 500+D HIGH POTENCY) 500-400 MG-UNIT tablet Take 1 tablet by mouth daily.   cloNIDine (CATAPRES - DOSED IN MG/24 HR) 0.2 mg/24hr patch Place 1 patch  (0.2 mg total) onto the skin once a week.   CVS D3 50 MCG (2000 UT) CAPS TAKE 1 CAPSULE BY MOUTH EVERY DAY   FLOVENT HFA 44 MCG/ACT inhaler INHALE 2 PUFFS BY MOUTH INTO THE LUNGS DAILY   glucose blood (ACCU-CHEK GUIDE) test strip TEST 5 (FIVE) TIMES DAILY DIAGNOSIS CODE E11.9   hydrALAZINE (APRESOLINE) 50 MG tablet TAKE 1 TABLET BY MOUTH TWICE A DAY   HYDROcodone-acetaminophen (NORCO) 5-325 MG tablet Take 1 tablet by mouth every 6 (six) hours as needed for moderate pain.   insulin aspart (NOVOLOG FLEXPEN) 100 UNIT/ML FlexPen Inject 13 Units into the skin daily after breakfast AND 10 Units 2 (two) times daily with a meal.   insulin glargine (LANTUS) 100 UNIT/ML injection Inject 0.3 mLs (30 Units total) into the skin 2 (two) times daily. Inject 0.34 mL into the skin once daily   Insulin Syringe-Needle U-100 31G X 15/64" 0.3 ML MISC USE TO INJECT LANTUS INSULIN TWO TIMES A DAY   lidocaine-prilocaine (EMLA) cream Apply 1 application. topically as needed (dialysis).   loratadine (CLARITIN) 10 MG tablet Take 1 tablet (10 mg total) by mouth daily.   omeprazole (PRILOSEC) 20 MG capsule TAKE 1 CAPSULE BY MOUTH EVERY DAY   Polyethyl Glycol-Propyl Glycol (SYSTANE OP) Place 1 drop into both eyes daily as needed (for dry eyes).  sevelamer carbonate (RENVELA) 800 MG tablet Take 800 mg by mouth 3 (three) times daily with meals.   verapamil (CALAN-SR) 180 MG CR tablet TAKE 2 TABLETS (360 MG TOTAL) BY MOUTH AT BEDTIME.   [DISCONTINUED] cloNIDine (CATAPRES - DOSED IN MG/24 HR) 0.2 mg/24hr patch PLACE 1 PATCH (0.2 MG TOTAL) ONTO THE SKIN ONCE A WEEK.   [DISCONTINUED] insulin aspart (NOVOLOG FLEXPEN) 100 UNIT/ML FlexPen Inject 5 Units into the skin 3 (three) times daily with meals.   [DISCONTINUED] insulin glargine (LANTUS) 100 UNIT/ML injection Inject 0.34 mL into the skin once daily (Patient taking differently: Inject 20 Units into the skin 2 (two) times daily.)   No facility-administered encounter medications on  file as of 03/13/2022.    Allergies (verified) Hydrochlorothiazide and Penicillins   History: Past Medical History:  Diagnosis Date   AKI (acute kidney injury) ([place]) 08/12/2019   Chronic kidney disease    Dialysis T/Th/Sat- [address]   CKD stage G1/A3, GFR > 90 and albumin creatinine ratio >300 mg/g 07/08/2016   Nephrotic range proteinuria   Diabetes mellitus    type 2    Dyspnea    "when I have too much fluid."   Ganglion cyst    right wrist   GERD (gastroesophageal reflux disease)    History of radiation therapy 02/27/10 -04/13/10   left base of tongue, bilat neck   Hyperlipidemia    Hypertension    Iron deficiency anemia    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   Menopause    Thyroid disease    Hyperthyroidism   Tobacco abuse    PT HAS QUIT   Past Surgical History:  Procedure Laterality Date   AV FISTULA PLACEMENT Left 06/02/2020   Procedure: INSERTION OF ARTERIOVENOUS (AV) GORE-TEX GRAFT ARM LEFT;  Surgeon: Marty Heck, MD;  Location: [place] [facility];  Service: Vascular;  Laterality: Left;   AV FISTULA PLACEMENT Right 12/27/2020   Procedure: RIGHT ARM CEPHALIC ARTERIOVENOUS FISTULA CREATION;  Surgeon: Waynetta Sandy, MD;  Location: [place];  Service: Vascular;  Laterality: Right;   [place] REMOVAL Left 12/21/2021   Procedure: EXCISION OF LEFT ARM ARTERIOVENOUS GORETEX GRAFT ([place]);  Surgeon: Waynetta Sandy, MD;  Location: [facility];  Service: Vascular;  Laterality: Left;   IR FLUORO GUIDE CV LINE LEFT  05/15/2020   IR REMOVAL TUN CV CATH W/O FL  05/15/2020   IR REMOVAL TUN CV CATH W/O FL  06/15/2021   IR US GUIDE VASC ACCESS LEFT  05/15/2020   NECK SURGERY     REDUCTION MAMMAPLASTY     Family History  Problem Relation Age of Onset   Stroke Father    Cancer Father        unknown ca   Hypertension Mother    Diabetes Mother    Chronic Renal Failure Sister    Chronic Renal Failure Brother    Cancer Paternal Aunt        colon ca    Breast cancer Neg Hx    Social History   Socioeconomic History   Marital status: Single    Spouse name: Not on file   Number of children: Not on file   Years of education: 11   Highest education level: Not on file  Occupational History    Employer: UNEMPLOYED    Employer: UNEMPLOYED  Tobacco Use   Smoking status: Former    Packs/day: 0.30    Types: Cigarettes    Quit date: 04/03/2021  Years since quitting: 0.9    Passive exposure: Never   Smokeless tobacco: Never   Tobacco comments:    1 cig per day   Vaping Use   Vaping Use: Never used  Substance and Sexual Activity   Alcohol use: No    Alcohol/week: 0.0 standard drinks of alcohol   Drug use: No   Sexual activity: Not on file  Other Topics Concern   Not on file  Social History Narrative   Not on file   Social Determinants of Health   Financial Resource Strain: Low Risk  (03/13/2022)   Overall Financial Resource Strain (CARDIA)    Difficulty of Paying Living Expenses: Not hard at all  Food Insecurity: No Food Insecurity (03/13/2022)   Hunger Vital Sign    Worried About Running Out of Food in the Last Year: Never true    Ran Out of Food in the Last Year: Never true  Transportation Needs: No Transportation Needs (03/13/2022)   PRAPARE - Hydrologist (Medical): No    Lack of Transportation (Non-Medical): No  Physical Activity: Insufficiently Active (03/13/2022)   Exercise Vital Sign    Days of Exercise per Week: 4 days    Minutes of Exercise per Session: 30 min  Stress: No Stress Concern Present (03/13/2022)   [place]    Feeling of Stress : Not at all  Social Connections: [place] (03/13/2022)   Social Connection and Isolation Panel [NHANES]    Frequency of Communication with Friends and Family: More than three times a week    Frequency of Social Gatherings with Friends and Family: More than three times a week     Attends Religious Services: More than 4 times per year    Active Member of [employer] or Organizations: Yes    Attends Archivist Meetings: 1 to 4 times per year    Marital Status: Living with partner    Tobacco Counseling Counseling given: Not Answered Tobacco comments: 1 cig per day  Pt stopped smoking one month ago   Clinical Intake:  Pre-visit preparation completed: Yes  Pain : No/denies pain     Diabetes: Yes     Diabetic?Yes Test blood glucose 2-3 times daily Brought meter to visit  Interpreter Needed?: No      Activities of Daily Living    03/13/2022   11:30 AM 12/24/2021    2:45 PM  In your present state of health, do you have any difficulty performing the following activities:  Hearing? 0   Vision? 0   Difficulty concentrating or making decisions? 0   Walking or climbing stairs? 0   Dressing or bathing? 0   Doing errands, shopping? 0 0    Patient Care Team: Timothy Lasso, MD as PCP - General Monna Fam, MD as Consulting Physician (Ophthalmology) [facility], [place] any recent Medical Services you may have received from other than [facility] providers in the past year (date may be approximate).     Assessment:   This is a routine wellness examination for Susan Evans.  Hearing/Vision screen No results found.  Dietary issues and exercise activities discussed:     Goals Addressed   None   Depression Screen    03/13/2022   12:56 PM 08/31/2021   12:11 PM 04/13/2021   10:19 AM 03/14/2021   11:18 AM 01/10/2021    1:17 PM 06/14/2020    2:14 PM 02/29/2020  3:23 PM  PHQ 2/9 Scores  PHQ - 2 Score 3 3 0 0 0 0 6  PHQ- 9 Score 6 3     6     Fall Risk    03/13/2022   12:55 PM 08/31/2021   10:11 AM 04/13/2021   10:19 AM 03/14/2021   11:19 AM 02/16/2021   10:17 AM  Fall Risk   Falls in the past year? 0 0 0 0 0  Number falls in past yr: 0 0  0   Injury with Fall? 0 0  0   Risk for fall due to : No Fall Risks No Fall Risks    No Fall Risks  Follow up Falls evaluation completed Falls evaluation completed       FALL RISK PREVENTION PERTAINING TO THE HOME:  Any stairs in or around the home? No  If so, are there any without handrails?  N/a Home free of loose throw rugs in walkways, pet beds, electrical cords, etc? no Adequate lighting in your home to reduce risk of falls? Yes   ASSISTIVE DEVICES UTILIZED TO PREVENT FALLS:  Life alert? Yes  (watch) Use of a cane, walker or w/c? No  Grab bars in the bathroom? No  Shower chair or bench in shower? No  Elevated toilet seat or a handicapped toilet? No   TIMED UP AND GO:  Was the test performed? No .    Gait slow and steady without use of assistive device  Cognitive Function:        03/13/2022   12:54 PM  6CIT Screen  What Year? 0 points  What month? 0 points  What time? 0 points  Count back from 20 0 points  Months in reverse 0 points  Repeat phrase 0 points  Total Score 0 points    Immunizations Immunization History  Administered Date(s) Administered   Hepatitis B, adult 01/25/2015   Influenza Split 07/18/2011, 07/05/2014   Influenza Whole 06/23/2008, 06/22/2009, 06/06/2010   Influenza,inj,Quad PF,6+ Mos 06/29/2012, 07/14/2013, 07/27/2015, 06/21/2016, 07/05/2016, 07/18/2017, 10/21/2018, 06/28/2019, 06/22/2020, 06/12/2021   PFIZER(Purple Top)SARS-COV-2 Vaccination 12/28/2019, 01/27/2020   PNEUMOCOCCAL CONJUGATE-20 03/14/2021   Pfizer Covid-19 Vaccine Bivalent Booster 65yr & up 06/14/2021   Pneumococcal Conjugate-13 06/06/2020   Pneumococcal Polysaccharide-23 12/24/2009, 08/22/2012, 07/05/2014, 10/10/2017, 09/27/2021   Tdap 03/14/2021    TDAP status: Up to date  Flu Vaccine status: Up to date  Pneumococcal vaccine status: Up to date  Covid-19 vaccine status: Completed vaccines  Qualifies for Shingles Vaccine? No   Zostavax completed No   Shingrix Completed?: No.    Education has been provided regarding the importance of this  vaccine. Patient has been advised to call insurance company to determine out of pocket expense if they have not yet received this vaccine. Advised may also receive vaccine at local pharmacy or Health Dept. Verbalized acceptance and understanding.  Screening Tests Health Maintenance  Topic Date Due   Zoster Vaccines- Shingrix (1 of 2) Never done   COLON CANCER SCREENING ANNUAL FOBT  02/15/2009   LIPID PANEL  02/28/2021   COVID-19 Vaccine (4 - Booster for Pfizer series) 08/09/2021   MAMMOGRAM  12/05/2021   FOOT EXAM  03/14/2022   OPHTHALMOLOGY EXAM  04/11/2022   INFLUENZA VACCINE  04/23/2022   HEMOGLOBIN A1C  06/13/2022   PAP SMEAR-Modifier  08/31/2024   TETANUS/TDAP  03/15/2031   Pneumonia Vaccine 66 Years old  Completed   DEXA SCAN  Completed   Hepatitis C Screening  Completed   HIV Screening  Completed   HPV VACCINES  Aged Out   COLONOSCOPY (Pts 45-50yr Insurance coverage will need to be confirmed)  Discontinued    Health Maintenance  Health Maintenance Due  Topic Date Due   Zoster Vaccines- Shingrix (1 of 2) Never done   COLON CANCER SCREENING ANNUAL FOBT  02/15/2009   LIPID PANEL  02/28/2021   COVID-19 Vaccine (4 - Booster for [place]) 08/09/2021   MAMMOGRAM  12/05/2021    Colorectal cancer screening: Type of screening: Colonoscopy. Completed 2007. Repeat every 10 years  Mammogram status: Ordered 03/13/2022. Pt provided with contact info and advised to call to schedule appt.   Bone Density status: Completed 05/22/2017. Results reflect: Bone density results: OSTEOPOROSIS. Repeat every 0 years.  Lung Cancer Screening: (Low Dose CT Chest recommended if Age 66-80years, 30 pack-year currently smoking OR have quit w/in 15years.) does qualify.   Lung Cancer Screening Referral: defer to pcp   Additional Screening:  Hepatitis C Screening: does not qualify; Completed 10/10/2017  Vision Screening: Recommended annual ophthalmology exams for early detection of  glaucoma and other disorders of the eye. Is the patient up to date with their annual eye exam?  No  Who is the provider or what is the name of the office in which the patient attends annual eye exams? Dr.Gould If pt is not established with a provider, would they like to be referred to a provider to establish care? No .   Dental Screening: Recommended annual dental exams for proper oral hygiene  Community Resource Referral / Chronic Care Management: CRR required this visit?  No   CCM required this visit?  No      Plan:     I have personally reviewed and noted the following in the patient's chart:   Medical and social history Use of alcohol, tobacco or illicit drugs  Current medications and supplements including opioid prescriptions. Patient is not currently taking opioid prescriptions. Functional ability and status Nutritional status Physical activity Advanced directives List of other physicians Hospitalizations, surgeries, and ER visits in previous 12 months Vitals Screenings to include cognitive, depression, and falls Referrals and appointments  In addition, I have reviewed and discussed with patient certain preventive protocols, quality metrics, and best practice recommendations. A written personalized care plan for preventive services as well as general preventive health recommendations were provided to patient.     DDespina Hidden06/21/2023  Nurse Notes: Face to Face minute visit 25 States she usually gets 7 hours of sleep a night, was told that she snores  Has someone available for help as much as she needs it Pt rates her overall health as "good" during the past 4 weeks Does not use any home assistive devices.   Susan Evans, Thank you for taking time to come for your Medicare Wellness Visit. I appreciate your ongoing commitment to your health goals. Please review the following plan we discussed and let me know if I can assist you in the future.   These are the  goals we discussed:  Goals      Blood Pressure < 140/90     HEMOGLOBIN A1C < 7.0     LDL CALC < 100        This is a list of the screening recommended for you and due dates:  Health Maintenance  Topic Date Due   Zoster (Shingles) Vaccine (1 of 2) Never done   Stool Blood Test  02/15/2009   Lipid (cholesterol) test  02/28/2021  COVID-19 Vaccine (4 - Booster for Pfizer series) 08/09/2021   Mammogram  12/05/2021   Complete foot exam   03/14/2022   Eye exam for diabetics  04/11/2022   Flu Shot  04/23/2022   Hemoglobin A1C  06/13/2022   Pap Smear  08/31/2024   Tetanus Vaccine  03/15/2031   Pneumonia Vaccine  Completed   DEXA scan (bone density measurement)  Completed   Hepatitis C Screening: USPSTF Recommendation to screen - Ages 23-79 yo.  Completed   HIV Screening  Completed   HPV Vaccine  Aged Out   Colon Cancer Screening  Discontinued

## 2022-03-13 NOTE — Assessment & Plan Note (Signed)
Blood pressure well controlled at 129/68.  -Continue verapamil 360 mg daily -Continue clonidine patch 0.2 mg weekly -Continue hydralazine 50 mg twice daily

## 2022-03-13 NOTE — Progress Notes (Signed)
 CC: routine clinic visit for HTN and DM2  HPI:  Susan Evans is a 66 y.o. female with a PMHx stated below and presents today for stated above. Please see the Encounters tab for problem-based Assessment & Plan for additional details.   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     atorvastatin (LIPITOR) 80 MG tablet TAKE 1 TABLET BY MOUTH EVERY DAY 30 tablet 11   B-D UF III MINI PEN NEEDLES 31G X 5 MM MISC USE TO INJECT NOVOLOG 3 TIMES A DAY. DIAGNOSIS CODE E11.9, Z79.4 100 each 8   Blood Glucose Monitoring Suppl (ACCU-CHEK AVIVA PLUS) w/Device KIT Use to test blood glucose  3 times daily 1 kit 0   calcium-vitamin D (CALCIUM 500+D HIGH POTENCY) 500-400 MG-UNIT tablet Take 1 tablet by mouth daily. 90 tablet 3   [facility] D3 50 MCG (2000 UT) CAPS TAKE 1 CAPSULE BY MOUTH EVERY DAY 90 capsule 3   FLOVENT HFA 44 MCG/ACT inhaler INHALE 2 PUFFS BY MOUTH INTO THE LUNGS DAILY 10.6 each 3   glucose blood (ACCU-CHEK GUIDE) test strip TEST  5 (FIVE) TIMES DAILY DIAGNOSIS CODE E11.9 150 strip 12   hydrALAZINE (APRESOLINE) 50 MG tablet TAKE 1 TABLET BY MOUTH TWICE A DAY 60 tablet 3   HYDROcodone-acetaminophen (NORCO) 5-325 MG tablet Take 1 tablet by mouth every 6 (six) hours as needed for moderate pain. 10 tablet 0   Insulin Syringe-Needle U-100 31G X 15/64" 0.3 ML MISC USE TO INJECT LANTUS INSULIN TWO TIMES A DAY 180 each 3   lidocaine-prilocaine (EMLA) cream Apply 1 application. topically as needed (dialysis).     loratadine (CLARITIN) 10 MG tablet Take 1 tablet (10 mg total) by mouth daily. 30 tablet 2   omeprazole (PRILOSEC) 20 MG capsule TAKE 1 CAPSULE BY MOUTH EVERY DAY 90 capsule 2   Polyethyl Glycol-Propyl Glycol (SYSTANE OP) Place 1 drop into both eyes daily as needed (for dry eyes).     sevelamer carbonate (RENVELA) 800 MG tablet Take 800 mg by mouth 3 (three) times daily with meals.     verapamil (CALAN-SR) 180 MG CR tablet TAKE 2 TABLETS (360 MG TOTAL) BY MOUTH AT BEDTIME. 60 tablet 3   No current facility-administered medications on file prior to visit.    Family History  Problem Relation Age of Onset     Stroke Father    Cancer Father        unknown ca   Hypertension Mother    Diabetes Mother    Chronic Renal Failure Sister    Chronic Renal Failure Brother    Cancer Paternal Aunt        colon ca   Breast cancer Neg Hx     Social History   Socioeconomic History   Marital status: Single    Spouse name: Not on file   Number of children: Not on file   Years of education: 11   Highest education level: Not on file  Occupational History    Employer: UNEMPLOYED    Employer: UNEMPLOYED  Tobacco Use   Smoking status: Former    Packs/day: 0.30    Types: Cigarettes    Quit date: 04/03/2021    Years since quitting: 0.9    Passive exposure: Never   Smokeless tobacco: Never   Tobacco comments:    1 cig per day   Vaping Use   Vaping Use: Never used  Substance and Sexual Activity   Alcohol use: No     Alcohol/week: 0.0 standard drinks of alcohol   Drug use: No   Sexual activity: Not on file  Other Topics Concern   Not on file  Social History Narrative   Not on file   Social Determinants of Health   Financial Resource Strain: Not on file  Food Insecurity: Not on file  Transportation Needs: Not on file  Physical Activity: Not on file  Stress: Not on file  Social Connections: Not on file  Intimate Partner Violence: Not on file    Review of Systems: ROS negative except for what is noted on the assessment and plan.  Vitals:   03/13/22 1044  BP: 129/68  Pulse: 96  Temp: 97.8 F (36.6 C)  TempSrc: Oral  SpO2: 96%  Weight: 91 lb 11.2 oz (41.6 kg)  Height: 5' 2" (1.575 m)    Physical Exam: Constitutional: alert, chronically ill-appearing, in NAD Cardiovascular: RRR, no m/r/g, non-edematous bilateral LE Pulmonary/Chest: normal work of breathing on RA, LCTAB MSK: reduced bulk and tone  Neurological: A&O x 3 and follows commands  Skin: warm and dry  Psych: normal behavior, normal affect   Assessment & Plan:   See Encounters Tab for problem based charting.  Patient discussed with Dr. Machen  Monik Patel, MD  Internal Medicine Resident, PGY-1 [facility]  

## 2022-03-14 DIAGNOSIS — L299 Pruritus, unspecified: Secondary | ICD-10-CM | POA: Diagnosis not present

## 2022-03-14 DIAGNOSIS — N186 End stage renal disease: Secondary | ICD-10-CM | POA: Diagnosis not present

## 2022-03-14 DIAGNOSIS — E876 Hypokalemia: Secondary | ICD-10-CM | POA: Diagnosis not present

## 2022-03-14 DIAGNOSIS — D631 Anemia in chronic kidney disease: Secondary | ICD-10-CM | POA: Diagnosis not present

## 2022-03-14 DIAGNOSIS — E611 Iron deficiency: Secondary | ICD-10-CM | POA: Diagnosis not present

## 2022-03-14 DIAGNOSIS — Z992 Dependence on renal dialysis: Secondary | ICD-10-CM | POA: Diagnosis not present

## 2022-03-14 DIAGNOSIS — N2581 Secondary hyperparathyroidism of renal origin: Secondary | ICD-10-CM | POA: Diagnosis not present

## 2022-03-14 NOTE — Progress Notes (Signed)
Internal Medicine Clinic Attending  Case discussed with Dr. Patel  At the time of the visit.  We reviewed the resident's history and exam and pertinent patient test results.  I agree with the assessment, diagnosis, and plan of care documented in the resident's note.  

## 2022-03-16 DIAGNOSIS — E611 Iron deficiency: Secondary | ICD-10-CM | POA: Diagnosis not present

## 2022-03-16 DIAGNOSIS — E876 Hypokalemia: Secondary | ICD-10-CM | POA: Diagnosis not present

## 2022-03-16 DIAGNOSIS — L299 Pruritus, unspecified: Secondary | ICD-10-CM | POA: Diagnosis not present

## 2022-03-16 DIAGNOSIS — Z992 Dependence on renal dialysis: Secondary | ICD-10-CM | POA: Diagnosis not present

## 2022-03-16 DIAGNOSIS — D631 Anemia in chronic kidney disease: Secondary | ICD-10-CM | POA: Diagnosis not present

## 2022-03-16 DIAGNOSIS — N2581 Secondary hyperparathyroidism of renal origin: Secondary | ICD-10-CM | POA: Diagnosis not present

## 2022-03-16 DIAGNOSIS — N186 End stage renal disease: Secondary | ICD-10-CM | POA: Diagnosis not present

## 2022-03-19 DIAGNOSIS — Z992 Dependence on renal dialysis: Secondary | ICD-10-CM | POA: Diagnosis not present

## 2022-03-19 DIAGNOSIS — E876 Hypokalemia: Secondary | ICD-10-CM | POA: Diagnosis not present

## 2022-03-19 DIAGNOSIS — L299 Pruritus, unspecified: Secondary | ICD-10-CM | POA: Diagnosis not present

## 2022-03-19 DIAGNOSIS — D631 Anemia in chronic kidney disease: Secondary | ICD-10-CM | POA: Diagnosis not present

## 2022-03-19 DIAGNOSIS — E611 Iron deficiency: Secondary | ICD-10-CM | POA: Diagnosis not present

## 2022-03-19 DIAGNOSIS — N186 End stage renal disease: Secondary | ICD-10-CM | POA: Diagnosis not present

## 2022-03-19 DIAGNOSIS — N2581 Secondary hyperparathyroidism of renal origin: Secondary | ICD-10-CM | POA: Diagnosis not present

## 2022-03-21 DIAGNOSIS — N186 End stage renal disease: Secondary | ICD-10-CM | POA: Diagnosis not present

## 2022-03-21 DIAGNOSIS — D631 Anemia in chronic kidney disease: Secondary | ICD-10-CM | POA: Diagnosis not present

## 2022-03-21 DIAGNOSIS — E876 Hypokalemia: Secondary | ICD-10-CM | POA: Diagnosis not present

## 2022-03-21 DIAGNOSIS — E611 Iron deficiency: Secondary | ICD-10-CM | POA: Diagnosis not present

## 2022-03-21 DIAGNOSIS — N2581 Secondary hyperparathyroidism of renal origin: Secondary | ICD-10-CM | POA: Diagnosis not present

## 2022-03-21 DIAGNOSIS — Z992 Dependence on renal dialysis: Secondary | ICD-10-CM | POA: Diagnosis not present

## 2022-03-21 DIAGNOSIS — L299 Pruritus, unspecified: Secondary | ICD-10-CM | POA: Diagnosis not present

## 2022-03-23 DIAGNOSIS — E1129 Type 2 diabetes mellitus with other diabetic kidney complication: Secondary | ICD-10-CM | POA: Diagnosis not present

## 2022-03-23 DIAGNOSIS — R519 Headache, unspecified: Secondary | ICD-10-CM | POA: Diagnosis not present

## 2022-03-23 DIAGNOSIS — L299 Pruritus, unspecified: Secondary | ICD-10-CM | POA: Diagnosis not present

## 2022-03-23 DIAGNOSIS — Z992 Dependence on renal dialysis: Secondary | ICD-10-CM | POA: Diagnosis not present

## 2022-03-23 DIAGNOSIS — N2581 Secondary hyperparathyroidism of renal origin: Secondary | ICD-10-CM | POA: Diagnosis not present

## 2022-03-23 DIAGNOSIS — N186 End stage renal disease: Secondary | ICD-10-CM | POA: Diagnosis not present

## 2022-03-23 DIAGNOSIS — E611 Iron deficiency: Secondary | ICD-10-CM | POA: Diagnosis not present

## 2022-03-23 DIAGNOSIS — D631 Anemia in chronic kidney disease: Secondary | ICD-10-CM | POA: Diagnosis not present

## 2022-03-23 DIAGNOSIS — E876 Hypokalemia: Secondary | ICD-10-CM | POA: Diagnosis not present

## 2022-03-26 DIAGNOSIS — L299 Pruritus, unspecified: Secondary | ICD-10-CM | POA: Diagnosis not present

## 2022-03-26 DIAGNOSIS — E611 Iron deficiency: Secondary | ICD-10-CM | POA: Diagnosis not present

## 2022-03-26 DIAGNOSIS — E1129 Type 2 diabetes mellitus with other diabetic kidney complication: Secondary | ICD-10-CM | POA: Diagnosis not present

## 2022-03-26 DIAGNOSIS — Z992 Dependence on renal dialysis: Secondary | ICD-10-CM | POA: Diagnosis not present

## 2022-03-26 DIAGNOSIS — R519 Headache, unspecified: Secondary | ICD-10-CM | POA: Diagnosis not present

## 2022-03-26 DIAGNOSIS — N2581 Secondary hyperparathyroidism of renal origin: Secondary | ICD-10-CM | POA: Diagnosis not present

## 2022-03-26 DIAGNOSIS — D631 Anemia in chronic kidney disease: Secondary | ICD-10-CM | POA: Diagnosis not present

## 2022-03-26 DIAGNOSIS — N186 End stage renal disease: Secondary | ICD-10-CM | POA: Diagnosis not present

## 2022-03-26 DIAGNOSIS — E876 Hypokalemia: Secondary | ICD-10-CM | POA: Diagnosis not present

## 2022-03-28 DIAGNOSIS — E611 Iron deficiency: Secondary | ICD-10-CM | POA: Diagnosis not present

## 2022-03-28 DIAGNOSIS — E876 Hypokalemia: Secondary | ICD-10-CM | POA: Diagnosis not present

## 2022-03-28 DIAGNOSIS — N186 End stage renal disease: Secondary | ICD-10-CM | POA: Diagnosis not present

## 2022-03-28 DIAGNOSIS — L299 Pruritus, unspecified: Secondary | ICD-10-CM | POA: Diagnosis not present

## 2022-03-28 DIAGNOSIS — Z992 Dependence on renal dialysis: Secondary | ICD-10-CM | POA: Diagnosis not present

## 2022-03-28 DIAGNOSIS — R519 Headache, unspecified: Secondary | ICD-10-CM | POA: Diagnosis not present

## 2022-03-28 DIAGNOSIS — E1129 Type 2 diabetes mellitus with other diabetic kidney complication: Secondary | ICD-10-CM | POA: Diagnosis not present

## 2022-03-28 DIAGNOSIS — N2581 Secondary hyperparathyroidism of renal origin: Secondary | ICD-10-CM | POA: Diagnosis not present

## 2022-03-28 DIAGNOSIS — D631 Anemia in chronic kidney disease: Secondary | ICD-10-CM | POA: Diagnosis not present

## 2022-03-30 DIAGNOSIS — R519 Headache, unspecified: Secondary | ICD-10-CM | POA: Diagnosis not present

## 2022-03-30 DIAGNOSIS — E876 Hypokalemia: Secondary | ICD-10-CM | POA: Diagnosis not present

## 2022-03-30 DIAGNOSIS — D631 Anemia in chronic kidney disease: Secondary | ICD-10-CM | POA: Diagnosis not present

## 2022-03-30 DIAGNOSIS — N2581 Secondary hyperparathyroidism of renal origin: Secondary | ICD-10-CM | POA: Diagnosis not present

## 2022-03-30 DIAGNOSIS — E611 Iron deficiency: Secondary | ICD-10-CM | POA: Diagnosis not present

## 2022-03-30 DIAGNOSIS — Z992 Dependence on renal dialysis: Secondary | ICD-10-CM | POA: Diagnosis not present

## 2022-03-30 DIAGNOSIS — L299 Pruritus, unspecified: Secondary | ICD-10-CM | POA: Diagnosis not present

## 2022-03-30 DIAGNOSIS — N186 End stage renal disease: Secondary | ICD-10-CM | POA: Diagnosis not present

## 2022-03-30 DIAGNOSIS — E1129 Type 2 diabetes mellitus with other diabetic kidney complication: Secondary | ICD-10-CM | POA: Diagnosis not present

## 2022-04-02 DIAGNOSIS — R519 Headache, unspecified: Secondary | ICD-10-CM | POA: Diagnosis not present

## 2022-04-02 DIAGNOSIS — L299 Pruritus, unspecified: Secondary | ICD-10-CM | POA: Diagnosis not present

## 2022-04-02 DIAGNOSIS — N2581 Secondary hyperparathyroidism of renal origin: Secondary | ICD-10-CM | POA: Diagnosis not present

## 2022-04-02 DIAGNOSIS — E611 Iron deficiency: Secondary | ICD-10-CM | POA: Diagnosis not present

## 2022-04-02 DIAGNOSIS — E876 Hypokalemia: Secondary | ICD-10-CM | POA: Diagnosis not present

## 2022-04-02 DIAGNOSIS — N186 End stage renal disease: Secondary | ICD-10-CM | POA: Diagnosis not present

## 2022-04-02 DIAGNOSIS — Z992 Dependence on renal dialysis: Secondary | ICD-10-CM | POA: Diagnosis not present

## 2022-04-02 DIAGNOSIS — E1129 Type 2 diabetes mellitus with other diabetic kidney complication: Secondary | ICD-10-CM | POA: Diagnosis not present

## 2022-04-02 DIAGNOSIS — D631 Anemia in chronic kidney disease: Secondary | ICD-10-CM | POA: Diagnosis not present

## 2022-04-04 DIAGNOSIS — N186 End stage renal disease: Secondary | ICD-10-CM | POA: Diagnosis not present

## 2022-04-04 DIAGNOSIS — E876 Hypokalemia: Secondary | ICD-10-CM | POA: Diagnosis not present

## 2022-04-04 DIAGNOSIS — E1129 Type 2 diabetes mellitus with other diabetic kidney complication: Secondary | ICD-10-CM | POA: Diagnosis not present

## 2022-04-04 DIAGNOSIS — N2581 Secondary hyperparathyroidism of renal origin: Secondary | ICD-10-CM | POA: Diagnosis not present

## 2022-04-04 DIAGNOSIS — E611 Iron deficiency: Secondary | ICD-10-CM | POA: Diagnosis not present

## 2022-04-04 DIAGNOSIS — Z992 Dependence on renal dialysis: Secondary | ICD-10-CM | POA: Diagnosis not present

## 2022-04-04 DIAGNOSIS — D631 Anemia in chronic kidney disease: Secondary | ICD-10-CM | POA: Diagnosis not present

## 2022-04-04 DIAGNOSIS — L299 Pruritus, unspecified: Secondary | ICD-10-CM | POA: Diagnosis not present

## 2022-04-04 DIAGNOSIS — R519 Headache, unspecified: Secondary | ICD-10-CM | POA: Diagnosis not present

## 2022-04-06 DIAGNOSIS — Z992 Dependence on renal dialysis: Secondary | ICD-10-CM | POA: Diagnosis not present

## 2022-04-06 DIAGNOSIS — L299 Pruritus, unspecified: Secondary | ICD-10-CM | POA: Diagnosis not present

## 2022-04-06 DIAGNOSIS — R519 Headache, unspecified: Secondary | ICD-10-CM | POA: Diagnosis not present

## 2022-04-06 DIAGNOSIS — N186 End stage renal disease: Secondary | ICD-10-CM | POA: Diagnosis not present

## 2022-04-06 DIAGNOSIS — D631 Anemia in chronic kidney disease: Secondary | ICD-10-CM | POA: Diagnosis not present

## 2022-04-06 DIAGNOSIS — E1129 Type 2 diabetes mellitus with other diabetic kidney complication: Secondary | ICD-10-CM | POA: Diagnosis not present

## 2022-04-06 DIAGNOSIS — E876 Hypokalemia: Secondary | ICD-10-CM | POA: Diagnosis not present

## 2022-04-06 DIAGNOSIS — E611 Iron deficiency: Secondary | ICD-10-CM | POA: Diagnosis not present

## 2022-04-06 DIAGNOSIS — N2581 Secondary hyperparathyroidism of renal origin: Secondary | ICD-10-CM | POA: Diagnosis not present

## 2022-04-09 DIAGNOSIS — E611 Iron deficiency: Secondary | ICD-10-CM | POA: Diagnosis not present

## 2022-04-09 DIAGNOSIS — D631 Anemia in chronic kidney disease: Secondary | ICD-10-CM | POA: Diagnosis not present

## 2022-04-09 DIAGNOSIS — R519 Headache, unspecified: Secondary | ICD-10-CM | POA: Diagnosis not present

## 2022-04-09 DIAGNOSIS — L299 Pruritus, unspecified: Secondary | ICD-10-CM | POA: Diagnosis not present

## 2022-04-09 DIAGNOSIS — N186 End stage renal disease: Secondary | ICD-10-CM | POA: Diagnosis not present

## 2022-04-09 DIAGNOSIS — Z992 Dependence on renal dialysis: Secondary | ICD-10-CM | POA: Diagnosis not present

## 2022-04-09 DIAGNOSIS — N2581 Secondary hyperparathyroidism of renal origin: Secondary | ICD-10-CM | POA: Diagnosis not present

## 2022-04-09 DIAGNOSIS — E1129 Type 2 diabetes mellitus with other diabetic kidney complication: Secondary | ICD-10-CM | POA: Diagnosis not present

## 2022-04-09 DIAGNOSIS — E876 Hypokalemia: Secondary | ICD-10-CM | POA: Diagnosis not present

## 2022-04-11 DIAGNOSIS — E876 Hypokalemia: Secondary | ICD-10-CM | POA: Diagnosis not present

## 2022-04-11 DIAGNOSIS — E1129 Type 2 diabetes mellitus with other diabetic kidney complication: Secondary | ICD-10-CM | POA: Diagnosis not present

## 2022-04-11 DIAGNOSIS — R519 Headache, unspecified: Secondary | ICD-10-CM | POA: Diagnosis not present

## 2022-04-11 DIAGNOSIS — E611 Iron deficiency: Secondary | ICD-10-CM | POA: Diagnosis not present

## 2022-04-11 DIAGNOSIS — D631 Anemia in chronic kidney disease: Secondary | ICD-10-CM | POA: Diagnosis not present

## 2022-04-11 DIAGNOSIS — Z992 Dependence on renal dialysis: Secondary | ICD-10-CM | POA: Diagnosis not present

## 2022-04-11 DIAGNOSIS — N2581 Secondary hyperparathyroidism of renal origin: Secondary | ICD-10-CM | POA: Diagnosis not present

## 2022-04-11 DIAGNOSIS — L299 Pruritus, unspecified: Secondary | ICD-10-CM | POA: Diagnosis not present

## 2022-04-11 DIAGNOSIS — N186 End stage renal disease: Secondary | ICD-10-CM | POA: Diagnosis not present

## 2022-04-13 DIAGNOSIS — N186 End stage renal disease: Secondary | ICD-10-CM | POA: Diagnosis not present

## 2022-04-13 DIAGNOSIS — N2581 Secondary hyperparathyroidism of renal origin: Secondary | ICD-10-CM | POA: Diagnosis not present

## 2022-04-13 DIAGNOSIS — D631 Anemia in chronic kidney disease: Secondary | ICD-10-CM | POA: Diagnosis not present

## 2022-04-13 DIAGNOSIS — E1129 Type 2 diabetes mellitus with other diabetic kidney complication: Secondary | ICD-10-CM | POA: Diagnosis not present

## 2022-04-13 DIAGNOSIS — E876 Hypokalemia: Secondary | ICD-10-CM | POA: Diagnosis not present

## 2022-04-13 DIAGNOSIS — Z992 Dependence on renal dialysis: Secondary | ICD-10-CM | POA: Diagnosis not present

## 2022-04-13 DIAGNOSIS — R519 Headache, unspecified: Secondary | ICD-10-CM | POA: Diagnosis not present

## 2022-04-13 DIAGNOSIS — L299 Pruritus, unspecified: Secondary | ICD-10-CM | POA: Diagnosis not present

## 2022-04-13 DIAGNOSIS — E611 Iron deficiency: Secondary | ICD-10-CM | POA: Diagnosis not present

## 2022-04-16 DIAGNOSIS — Z992 Dependence on renal dialysis: Secondary | ICD-10-CM | POA: Diagnosis not present

## 2022-04-16 DIAGNOSIS — N186 End stage renal disease: Secondary | ICD-10-CM | POA: Diagnosis not present

## 2022-04-16 DIAGNOSIS — E611 Iron deficiency: Secondary | ICD-10-CM | POA: Diagnosis not present

## 2022-04-16 DIAGNOSIS — L299 Pruritus, unspecified: Secondary | ICD-10-CM | POA: Diagnosis not present

## 2022-04-16 DIAGNOSIS — E876 Hypokalemia: Secondary | ICD-10-CM | POA: Diagnosis not present

## 2022-04-16 DIAGNOSIS — D631 Anemia in chronic kidney disease: Secondary | ICD-10-CM | POA: Diagnosis not present

## 2022-04-16 DIAGNOSIS — N2581 Secondary hyperparathyroidism of renal origin: Secondary | ICD-10-CM | POA: Diagnosis not present

## 2022-04-16 DIAGNOSIS — R519 Headache, unspecified: Secondary | ICD-10-CM | POA: Diagnosis not present

## 2022-04-16 DIAGNOSIS — E1129 Type 2 diabetes mellitus with other diabetic kidney complication: Secondary | ICD-10-CM | POA: Diagnosis not present

## 2022-04-18 DIAGNOSIS — N2581 Secondary hyperparathyroidism of renal origin: Secondary | ICD-10-CM | POA: Diagnosis not present

## 2022-04-18 DIAGNOSIS — E611 Iron deficiency: Secondary | ICD-10-CM | POA: Diagnosis not present

## 2022-04-18 DIAGNOSIS — E1129 Type 2 diabetes mellitus with other diabetic kidney complication: Secondary | ICD-10-CM | POA: Diagnosis not present

## 2022-04-18 DIAGNOSIS — L299 Pruritus, unspecified: Secondary | ICD-10-CM | POA: Diagnosis not present

## 2022-04-18 DIAGNOSIS — R519 Headache, unspecified: Secondary | ICD-10-CM | POA: Diagnosis not present

## 2022-04-18 DIAGNOSIS — Z992 Dependence on renal dialysis: Secondary | ICD-10-CM | POA: Diagnosis not present

## 2022-04-18 DIAGNOSIS — E876 Hypokalemia: Secondary | ICD-10-CM | POA: Diagnosis not present

## 2022-04-18 DIAGNOSIS — N186 End stage renal disease: Secondary | ICD-10-CM | POA: Diagnosis not present

## 2022-04-18 DIAGNOSIS — D631 Anemia in chronic kidney disease: Secondary | ICD-10-CM | POA: Diagnosis not present

## 2022-04-20 DIAGNOSIS — N186 End stage renal disease: Secondary | ICD-10-CM | POA: Diagnosis not present

## 2022-04-20 DIAGNOSIS — E1129 Type 2 diabetes mellitus with other diabetic kidney complication: Secondary | ICD-10-CM | POA: Diagnosis not present

## 2022-04-20 DIAGNOSIS — N2581 Secondary hyperparathyroidism of renal origin: Secondary | ICD-10-CM | POA: Diagnosis not present

## 2022-04-20 DIAGNOSIS — D631 Anemia in chronic kidney disease: Secondary | ICD-10-CM | POA: Diagnosis not present

## 2022-04-20 DIAGNOSIS — E611 Iron deficiency: Secondary | ICD-10-CM | POA: Diagnosis not present

## 2022-04-20 DIAGNOSIS — Z992 Dependence on renal dialysis: Secondary | ICD-10-CM | POA: Diagnosis not present

## 2022-04-20 DIAGNOSIS — L299 Pruritus, unspecified: Secondary | ICD-10-CM | POA: Diagnosis not present

## 2022-04-20 DIAGNOSIS — R519 Headache, unspecified: Secondary | ICD-10-CM | POA: Diagnosis not present

## 2022-04-20 DIAGNOSIS — E876 Hypokalemia: Secondary | ICD-10-CM | POA: Diagnosis not present

## 2022-04-22 DIAGNOSIS — T82898A Other specified complication of vascular prosthetic devices, implants and grafts, initial encounter: Secondary | ICD-10-CM | POA: Diagnosis not present

## 2022-04-22 DIAGNOSIS — N186 End stage renal disease: Secondary | ICD-10-CM | POA: Diagnosis not present

## 2022-04-22 DIAGNOSIS — Z992 Dependence on renal dialysis: Secondary | ICD-10-CM | POA: Diagnosis not present

## 2022-04-22 DIAGNOSIS — I871 Compression of vein: Secondary | ICD-10-CM | POA: Diagnosis not present

## 2022-04-22 DIAGNOSIS — I129 Hypertensive chronic kidney disease with stage 1 through stage 4 chronic kidney disease, or unspecified chronic kidney disease: Secondary | ICD-10-CM | POA: Diagnosis not present

## 2022-04-23 DIAGNOSIS — E611 Iron deficiency: Secondary | ICD-10-CM | POA: Diagnosis not present

## 2022-04-23 DIAGNOSIS — D631 Anemia in chronic kidney disease: Secondary | ICD-10-CM | POA: Diagnosis not present

## 2022-04-23 DIAGNOSIS — E876 Hypokalemia: Secondary | ICD-10-CM | POA: Diagnosis not present

## 2022-04-23 DIAGNOSIS — N186 End stage renal disease: Secondary | ICD-10-CM | POA: Diagnosis not present

## 2022-04-23 DIAGNOSIS — N2581 Secondary hyperparathyroidism of renal origin: Secondary | ICD-10-CM | POA: Diagnosis not present

## 2022-04-23 DIAGNOSIS — Z992 Dependence on renal dialysis: Secondary | ICD-10-CM | POA: Diagnosis not present

## 2022-04-24 ENCOUNTER — Other Ambulatory Visit: Payer: Self-pay | Admitting: Student

## 2022-04-24 DIAGNOSIS — M810 Age-related osteoporosis without current pathological fracture: Secondary | ICD-10-CM

## 2022-04-25 DIAGNOSIS — E876 Hypokalemia: Secondary | ICD-10-CM | POA: Diagnosis not present

## 2022-04-25 DIAGNOSIS — N186 End stage renal disease: Secondary | ICD-10-CM | POA: Diagnosis not present

## 2022-04-25 DIAGNOSIS — E611 Iron deficiency: Secondary | ICD-10-CM | POA: Diagnosis not present

## 2022-04-25 DIAGNOSIS — N2581 Secondary hyperparathyroidism of renal origin: Secondary | ICD-10-CM | POA: Diagnosis not present

## 2022-04-25 DIAGNOSIS — D631 Anemia in chronic kidney disease: Secondary | ICD-10-CM | POA: Diagnosis not present

## 2022-04-25 DIAGNOSIS — Z992 Dependence on renal dialysis: Secondary | ICD-10-CM | POA: Diagnosis not present

## 2022-04-27 DIAGNOSIS — N2581 Secondary hyperparathyroidism of renal origin: Secondary | ICD-10-CM | POA: Diagnosis not present

## 2022-04-27 DIAGNOSIS — D631 Anemia in chronic kidney disease: Secondary | ICD-10-CM | POA: Diagnosis not present

## 2022-04-27 DIAGNOSIS — Z992 Dependence on renal dialysis: Secondary | ICD-10-CM | POA: Diagnosis not present

## 2022-04-27 DIAGNOSIS — E611 Iron deficiency: Secondary | ICD-10-CM | POA: Diagnosis not present

## 2022-04-27 DIAGNOSIS — N186 End stage renal disease: Secondary | ICD-10-CM | POA: Diagnosis not present

## 2022-04-27 DIAGNOSIS — E876 Hypokalemia: Secondary | ICD-10-CM | POA: Diagnosis not present

## 2022-04-30 DIAGNOSIS — N186 End stage renal disease: Secondary | ICD-10-CM | POA: Diagnosis not present

## 2022-04-30 DIAGNOSIS — E876 Hypokalemia: Secondary | ICD-10-CM | POA: Diagnosis not present

## 2022-04-30 DIAGNOSIS — Z992 Dependence on renal dialysis: Secondary | ICD-10-CM | POA: Diagnosis not present

## 2022-04-30 DIAGNOSIS — D631 Anemia in chronic kidney disease: Secondary | ICD-10-CM | POA: Diagnosis not present

## 2022-04-30 DIAGNOSIS — N2581 Secondary hyperparathyroidism of renal origin: Secondary | ICD-10-CM | POA: Diagnosis not present

## 2022-04-30 DIAGNOSIS — E611 Iron deficiency: Secondary | ICD-10-CM | POA: Diagnosis not present

## 2022-05-01 ENCOUNTER — Telehealth: Payer: Self-pay | Admitting: Dietician

## 2022-05-02 DIAGNOSIS — E611 Iron deficiency: Secondary | ICD-10-CM | POA: Diagnosis not present

## 2022-05-02 DIAGNOSIS — N186 End stage renal disease: Secondary | ICD-10-CM | POA: Diagnosis not present

## 2022-05-02 DIAGNOSIS — Z992 Dependence on renal dialysis: Secondary | ICD-10-CM | POA: Diagnosis not present

## 2022-05-02 DIAGNOSIS — N2581 Secondary hyperparathyroidism of renal origin: Secondary | ICD-10-CM | POA: Diagnosis not present

## 2022-05-02 DIAGNOSIS — E876 Hypokalemia: Secondary | ICD-10-CM | POA: Diagnosis not present

## 2022-05-02 DIAGNOSIS — D631 Anemia in chronic kidney disease: Secondary | ICD-10-CM | POA: Diagnosis not present

## 2022-05-02 NOTE — Telephone Encounter (Signed)
Informed Susan Evans that her insurance now covers Continuous glucose monitoring. She is interested in using this technology for her diabetes. Gave her mail order name and number to call ( she prefers mail order)

## 2022-05-04 DIAGNOSIS — D631 Anemia in chronic kidney disease: Secondary | ICD-10-CM | POA: Diagnosis not present

## 2022-05-04 DIAGNOSIS — N2581 Secondary hyperparathyroidism of renal origin: Secondary | ICD-10-CM | POA: Diagnosis not present

## 2022-05-04 DIAGNOSIS — E611 Iron deficiency: Secondary | ICD-10-CM | POA: Diagnosis not present

## 2022-05-04 DIAGNOSIS — Z992 Dependence on renal dialysis: Secondary | ICD-10-CM | POA: Diagnosis not present

## 2022-05-04 DIAGNOSIS — N186 End stage renal disease: Secondary | ICD-10-CM | POA: Diagnosis not present

## 2022-05-04 DIAGNOSIS — E876 Hypokalemia: Secondary | ICD-10-CM | POA: Diagnosis not present

## 2022-05-06 DIAGNOSIS — H5203 Hypermetropia, bilateral: Secondary | ICD-10-CM | POA: Diagnosis not present

## 2022-05-06 DIAGNOSIS — H25813 Combined forms of age-related cataract, bilateral: Secondary | ICD-10-CM | POA: Diagnosis not present

## 2022-05-06 DIAGNOSIS — E113293 Type 2 diabetes mellitus with mild nonproliferative diabetic retinopathy without macular edema, bilateral: Secondary | ICD-10-CM | POA: Diagnosis not present

## 2022-05-06 DIAGNOSIS — H34211 Partial retinal artery occlusion, right eye: Secondary | ICD-10-CM | POA: Diagnosis not present

## 2022-05-06 LAB — HM DIABETES EYE EXAM

## 2022-05-07 DIAGNOSIS — N2581 Secondary hyperparathyroidism of renal origin: Secondary | ICD-10-CM | POA: Diagnosis not present

## 2022-05-07 DIAGNOSIS — D631 Anemia in chronic kidney disease: Secondary | ICD-10-CM | POA: Diagnosis not present

## 2022-05-07 DIAGNOSIS — N186 End stage renal disease: Secondary | ICD-10-CM | POA: Diagnosis not present

## 2022-05-07 DIAGNOSIS — E876 Hypokalemia: Secondary | ICD-10-CM | POA: Diagnosis not present

## 2022-05-07 DIAGNOSIS — Z992 Dependence on renal dialysis: Secondary | ICD-10-CM | POA: Diagnosis not present

## 2022-05-07 DIAGNOSIS — E611 Iron deficiency: Secondary | ICD-10-CM | POA: Diagnosis not present

## 2022-05-09 DIAGNOSIS — N186 End stage renal disease: Secondary | ICD-10-CM | POA: Diagnosis not present

## 2022-05-09 DIAGNOSIS — N2581 Secondary hyperparathyroidism of renal origin: Secondary | ICD-10-CM | POA: Diagnosis not present

## 2022-05-09 DIAGNOSIS — Z992 Dependence on renal dialysis: Secondary | ICD-10-CM | POA: Diagnosis not present

## 2022-05-09 DIAGNOSIS — E876 Hypokalemia: Secondary | ICD-10-CM | POA: Diagnosis not present

## 2022-05-09 DIAGNOSIS — D631 Anemia in chronic kidney disease: Secondary | ICD-10-CM | POA: Diagnosis not present

## 2022-05-09 DIAGNOSIS — E611 Iron deficiency: Secondary | ICD-10-CM | POA: Diagnosis not present

## 2022-05-11 DIAGNOSIS — E611 Iron deficiency: Secondary | ICD-10-CM | POA: Diagnosis not present

## 2022-05-11 DIAGNOSIS — D631 Anemia in chronic kidney disease: Secondary | ICD-10-CM | POA: Diagnosis not present

## 2022-05-11 DIAGNOSIS — N186 End stage renal disease: Secondary | ICD-10-CM | POA: Diagnosis not present

## 2022-05-11 DIAGNOSIS — Z992 Dependence on renal dialysis: Secondary | ICD-10-CM | POA: Diagnosis not present

## 2022-05-11 DIAGNOSIS — N2581 Secondary hyperparathyroidism of renal origin: Secondary | ICD-10-CM | POA: Diagnosis not present

## 2022-05-11 DIAGNOSIS — E876 Hypokalemia: Secondary | ICD-10-CM | POA: Diagnosis not present

## 2022-05-14 ENCOUNTER — Other Ambulatory Visit: Payer: Self-pay | Admitting: Internal Medicine

## 2022-05-14 DIAGNOSIS — N2581 Secondary hyperparathyroidism of renal origin: Secondary | ICD-10-CM | POA: Diagnosis not present

## 2022-05-14 DIAGNOSIS — I10 Essential (primary) hypertension: Secondary | ICD-10-CM

## 2022-05-14 DIAGNOSIS — Z992 Dependence on renal dialysis: Secondary | ICD-10-CM | POA: Diagnosis not present

## 2022-05-14 DIAGNOSIS — E611 Iron deficiency: Secondary | ICD-10-CM | POA: Diagnosis not present

## 2022-05-14 DIAGNOSIS — N186 End stage renal disease: Secondary | ICD-10-CM | POA: Diagnosis not present

## 2022-05-14 DIAGNOSIS — D631 Anemia in chronic kidney disease: Secondary | ICD-10-CM | POA: Diagnosis not present

## 2022-05-14 DIAGNOSIS — E876 Hypokalemia: Secondary | ICD-10-CM | POA: Diagnosis not present

## 2022-05-15 DIAGNOSIS — N186 End stage renal disease: Secondary | ICD-10-CM | POA: Diagnosis not present

## 2022-05-16 DIAGNOSIS — N186 End stage renal disease: Secondary | ICD-10-CM | POA: Diagnosis not present

## 2022-05-16 DIAGNOSIS — E876 Hypokalemia: Secondary | ICD-10-CM | POA: Diagnosis not present

## 2022-05-16 DIAGNOSIS — D631 Anemia in chronic kidney disease: Secondary | ICD-10-CM | POA: Diagnosis not present

## 2022-05-16 DIAGNOSIS — Z992 Dependence on renal dialysis: Secondary | ICD-10-CM | POA: Diagnosis not present

## 2022-05-16 DIAGNOSIS — E611 Iron deficiency: Secondary | ICD-10-CM | POA: Diagnosis not present

## 2022-05-16 DIAGNOSIS — N2581 Secondary hyperparathyroidism of renal origin: Secondary | ICD-10-CM | POA: Diagnosis not present

## 2022-05-18 DIAGNOSIS — N186 End stage renal disease: Secondary | ICD-10-CM | POA: Diagnosis not present

## 2022-05-18 DIAGNOSIS — E876 Hypokalemia: Secondary | ICD-10-CM | POA: Diagnosis not present

## 2022-05-18 DIAGNOSIS — D631 Anemia in chronic kidney disease: Secondary | ICD-10-CM | POA: Diagnosis not present

## 2022-05-18 DIAGNOSIS — N2581 Secondary hyperparathyroidism of renal origin: Secondary | ICD-10-CM | POA: Diagnosis not present

## 2022-05-18 DIAGNOSIS — Z992 Dependence on renal dialysis: Secondary | ICD-10-CM | POA: Diagnosis not present

## 2022-05-18 DIAGNOSIS — E611 Iron deficiency: Secondary | ICD-10-CM | POA: Diagnosis not present

## 2022-05-21 DIAGNOSIS — E611 Iron deficiency: Secondary | ICD-10-CM | POA: Diagnosis not present

## 2022-05-21 DIAGNOSIS — D631 Anemia in chronic kidney disease: Secondary | ICD-10-CM | POA: Diagnosis not present

## 2022-05-21 DIAGNOSIS — N2581 Secondary hyperparathyroidism of renal origin: Secondary | ICD-10-CM | POA: Diagnosis not present

## 2022-05-21 DIAGNOSIS — E876 Hypokalemia: Secondary | ICD-10-CM | POA: Diagnosis not present

## 2022-05-21 DIAGNOSIS — N186 End stage renal disease: Secondary | ICD-10-CM | POA: Diagnosis not present

## 2022-05-21 DIAGNOSIS — Z992 Dependence on renal dialysis: Secondary | ICD-10-CM | POA: Diagnosis not present

## 2022-05-23 DIAGNOSIS — E876 Hypokalemia: Secondary | ICD-10-CM | POA: Diagnosis not present

## 2022-05-23 DIAGNOSIS — N2581 Secondary hyperparathyroidism of renal origin: Secondary | ICD-10-CM | POA: Diagnosis not present

## 2022-05-23 DIAGNOSIS — D631 Anemia in chronic kidney disease: Secondary | ICD-10-CM | POA: Diagnosis not present

## 2022-05-23 DIAGNOSIS — N186 End stage renal disease: Secondary | ICD-10-CM | POA: Diagnosis not present

## 2022-05-23 DIAGNOSIS — Z992 Dependence on renal dialysis: Secondary | ICD-10-CM | POA: Diagnosis not present

## 2022-05-23 DIAGNOSIS — I129 Hypertensive chronic kidney disease with stage 1 through stage 4 chronic kidney disease, or unspecified chronic kidney disease: Secondary | ICD-10-CM | POA: Diagnosis not present

## 2022-05-23 DIAGNOSIS — E611 Iron deficiency: Secondary | ICD-10-CM | POA: Diagnosis not present

## 2022-05-25 DIAGNOSIS — E876 Hypokalemia: Secondary | ICD-10-CM | POA: Diagnosis not present

## 2022-05-25 DIAGNOSIS — D631 Anemia in chronic kidney disease: Secondary | ICD-10-CM | POA: Diagnosis not present

## 2022-05-25 DIAGNOSIS — Z992 Dependence on renal dialysis: Secondary | ICD-10-CM | POA: Diagnosis not present

## 2022-05-25 DIAGNOSIS — N2581 Secondary hyperparathyroidism of renal origin: Secondary | ICD-10-CM | POA: Diagnosis not present

## 2022-05-25 DIAGNOSIS — E611 Iron deficiency: Secondary | ICD-10-CM | POA: Diagnosis not present

## 2022-05-25 DIAGNOSIS — L299 Pruritus, unspecified: Secondary | ICD-10-CM | POA: Diagnosis not present

## 2022-05-25 DIAGNOSIS — N186 End stage renal disease: Secondary | ICD-10-CM | POA: Diagnosis not present

## 2022-05-28 DIAGNOSIS — N2581 Secondary hyperparathyroidism of renal origin: Secondary | ICD-10-CM | POA: Diagnosis not present

## 2022-05-28 DIAGNOSIS — E611 Iron deficiency: Secondary | ICD-10-CM | POA: Diagnosis not present

## 2022-05-28 DIAGNOSIS — L299 Pruritus, unspecified: Secondary | ICD-10-CM | POA: Diagnosis not present

## 2022-05-28 DIAGNOSIS — E876 Hypokalemia: Secondary | ICD-10-CM | POA: Diagnosis not present

## 2022-05-28 DIAGNOSIS — D631 Anemia in chronic kidney disease: Secondary | ICD-10-CM | POA: Diagnosis not present

## 2022-05-28 DIAGNOSIS — Z992 Dependence on renal dialysis: Secondary | ICD-10-CM | POA: Diagnosis not present

## 2022-05-28 DIAGNOSIS — N186 End stage renal disease: Secondary | ICD-10-CM | POA: Diagnosis not present

## 2022-05-30 DIAGNOSIS — N186 End stage renal disease: Secondary | ICD-10-CM | POA: Diagnosis not present

## 2022-05-30 DIAGNOSIS — L299 Pruritus, unspecified: Secondary | ICD-10-CM | POA: Diagnosis not present

## 2022-05-30 DIAGNOSIS — Z992 Dependence on renal dialysis: Secondary | ICD-10-CM | POA: Diagnosis not present

## 2022-05-30 DIAGNOSIS — E611 Iron deficiency: Secondary | ICD-10-CM | POA: Diagnosis not present

## 2022-05-30 DIAGNOSIS — E876 Hypokalemia: Secondary | ICD-10-CM | POA: Diagnosis not present

## 2022-05-30 DIAGNOSIS — D631 Anemia in chronic kidney disease: Secondary | ICD-10-CM | POA: Diagnosis not present

## 2022-05-30 DIAGNOSIS — N2581 Secondary hyperparathyroidism of renal origin: Secondary | ICD-10-CM | POA: Diagnosis not present

## 2022-06-01 DIAGNOSIS — N2581 Secondary hyperparathyroidism of renal origin: Secondary | ICD-10-CM | POA: Diagnosis not present

## 2022-06-01 DIAGNOSIS — N186 End stage renal disease: Secondary | ICD-10-CM | POA: Diagnosis not present

## 2022-06-01 DIAGNOSIS — E611 Iron deficiency: Secondary | ICD-10-CM | POA: Diagnosis not present

## 2022-06-01 DIAGNOSIS — E876 Hypokalemia: Secondary | ICD-10-CM | POA: Diagnosis not present

## 2022-06-01 DIAGNOSIS — Z992 Dependence on renal dialysis: Secondary | ICD-10-CM | POA: Diagnosis not present

## 2022-06-01 DIAGNOSIS — D631 Anemia in chronic kidney disease: Secondary | ICD-10-CM | POA: Diagnosis not present

## 2022-06-01 DIAGNOSIS — L299 Pruritus, unspecified: Secondary | ICD-10-CM | POA: Diagnosis not present

## 2022-06-04 DIAGNOSIS — N186 End stage renal disease: Secondary | ICD-10-CM | POA: Diagnosis not present

## 2022-06-04 DIAGNOSIS — N2581 Secondary hyperparathyroidism of renal origin: Secondary | ICD-10-CM | POA: Diagnosis not present

## 2022-06-04 DIAGNOSIS — L299 Pruritus, unspecified: Secondary | ICD-10-CM | POA: Diagnosis not present

## 2022-06-04 DIAGNOSIS — D631 Anemia in chronic kidney disease: Secondary | ICD-10-CM | POA: Diagnosis not present

## 2022-06-04 DIAGNOSIS — E876 Hypokalemia: Secondary | ICD-10-CM | POA: Diagnosis not present

## 2022-06-04 DIAGNOSIS — E611 Iron deficiency: Secondary | ICD-10-CM | POA: Diagnosis not present

## 2022-06-04 DIAGNOSIS — Z992 Dependence on renal dialysis: Secondary | ICD-10-CM | POA: Diagnosis not present

## 2022-06-06 DIAGNOSIS — Z992 Dependence on renal dialysis: Secondary | ICD-10-CM | POA: Diagnosis not present

## 2022-06-06 DIAGNOSIS — E876 Hypokalemia: Secondary | ICD-10-CM | POA: Diagnosis not present

## 2022-06-06 DIAGNOSIS — D631 Anemia in chronic kidney disease: Secondary | ICD-10-CM | POA: Diagnosis not present

## 2022-06-06 DIAGNOSIS — L299 Pruritus, unspecified: Secondary | ICD-10-CM | POA: Diagnosis not present

## 2022-06-06 DIAGNOSIS — N2581 Secondary hyperparathyroidism of renal origin: Secondary | ICD-10-CM | POA: Diagnosis not present

## 2022-06-06 DIAGNOSIS — N186 End stage renal disease: Secondary | ICD-10-CM | POA: Diagnosis not present

## 2022-06-06 DIAGNOSIS — E611 Iron deficiency: Secondary | ICD-10-CM | POA: Diagnosis not present

## 2022-06-08 DIAGNOSIS — N186 End stage renal disease: Secondary | ICD-10-CM | POA: Diagnosis not present

## 2022-06-08 DIAGNOSIS — E876 Hypokalemia: Secondary | ICD-10-CM | POA: Diagnosis not present

## 2022-06-08 DIAGNOSIS — D631 Anemia in chronic kidney disease: Secondary | ICD-10-CM | POA: Diagnosis not present

## 2022-06-08 DIAGNOSIS — N2581 Secondary hyperparathyroidism of renal origin: Secondary | ICD-10-CM | POA: Diagnosis not present

## 2022-06-08 DIAGNOSIS — L299 Pruritus, unspecified: Secondary | ICD-10-CM | POA: Diagnosis not present

## 2022-06-08 DIAGNOSIS — E611 Iron deficiency: Secondary | ICD-10-CM | POA: Diagnosis not present

## 2022-06-08 DIAGNOSIS — Z992 Dependence on renal dialysis: Secondary | ICD-10-CM | POA: Diagnosis not present

## 2022-06-11 DIAGNOSIS — D631 Anemia in chronic kidney disease: Secondary | ICD-10-CM | POA: Diagnosis not present

## 2022-06-11 DIAGNOSIS — N2581 Secondary hyperparathyroidism of renal origin: Secondary | ICD-10-CM | POA: Diagnosis not present

## 2022-06-11 DIAGNOSIS — L299 Pruritus, unspecified: Secondary | ICD-10-CM | POA: Diagnosis not present

## 2022-06-11 DIAGNOSIS — Z992 Dependence on renal dialysis: Secondary | ICD-10-CM | POA: Diagnosis not present

## 2022-06-11 DIAGNOSIS — E876 Hypokalemia: Secondary | ICD-10-CM | POA: Diagnosis not present

## 2022-06-11 DIAGNOSIS — E611 Iron deficiency: Secondary | ICD-10-CM | POA: Diagnosis not present

## 2022-06-11 DIAGNOSIS — N186 End stage renal disease: Secondary | ICD-10-CM | POA: Diagnosis not present

## 2022-06-13 DIAGNOSIS — N2581 Secondary hyperparathyroidism of renal origin: Secondary | ICD-10-CM | POA: Diagnosis not present

## 2022-06-13 DIAGNOSIS — L299 Pruritus, unspecified: Secondary | ICD-10-CM | POA: Diagnosis not present

## 2022-06-13 DIAGNOSIS — E611 Iron deficiency: Secondary | ICD-10-CM | POA: Diagnosis not present

## 2022-06-13 DIAGNOSIS — N186 End stage renal disease: Secondary | ICD-10-CM | POA: Diagnosis not present

## 2022-06-13 DIAGNOSIS — D631 Anemia in chronic kidney disease: Secondary | ICD-10-CM | POA: Diagnosis not present

## 2022-06-13 DIAGNOSIS — Z992 Dependence on renal dialysis: Secondary | ICD-10-CM | POA: Diagnosis not present

## 2022-06-13 DIAGNOSIS — E876 Hypokalemia: Secondary | ICD-10-CM | POA: Diagnosis not present

## 2022-06-15 DIAGNOSIS — N186 End stage renal disease: Secondary | ICD-10-CM | POA: Diagnosis not present

## 2022-06-15 DIAGNOSIS — L299 Pruritus, unspecified: Secondary | ICD-10-CM | POA: Diagnosis not present

## 2022-06-15 DIAGNOSIS — N2581 Secondary hyperparathyroidism of renal origin: Secondary | ICD-10-CM | POA: Diagnosis not present

## 2022-06-15 DIAGNOSIS — D631 Anemia in chronic kidney disease: Secondary | ICD-10-CM | POA: Diagnosis not present

## 2022-06-15 DIAGNOSIS — E611 Iron deficiency: Secondary | ICD-10-CM | POA: Diagnosis not present

## 2022-06-15 DIAGNOSIS — E876 Hypokalemia: Secondary | ICD-10-CM | POA: Diagnosis not present

## 2022-06-15 DIAGNOSIS — Z992 Dependence on renal dialysis: Secondary | ICD-10-CM | POA: Diagnosis not present

## 2022-06-18 DIAGNOSIS — N186 End stage renal disease: Secondary | ICD-10-CM | POA: Diagnosis not present

## 2022-06-18 DIAGNOSIS — D631 Anemia in chronic kidney disease: Secondary | ICD-10-CM | POA: Diagnosis not present

## 2022-06-18 DIAGNOSIS — E876 Hypokalemia: Secondary | ICD-10-CM | POA: Diagnosis not present

## 2022-06-18 DIAGNOSIS — L299 Pruritus, unspecified: Secondary | ICD-10-CM | POA: Diagnosis not present

## 2022-06-18 DIAGNOSIS — N2581 Secondary hyperparathyroidism of renal origin: Secondary | ICD-10-CM | POA: Diagnosis not present

## 2022-06-18 DIAGNOSIS — Z992 Dependence on renal dialysis: Secondary | ICD-10-CM | POA: Diagnosis not present

## 2022-06-18 DIAGNOSIS — E611 Iron deficiency: Secondary | ICD-10-CM | POA: Diagnosis not present

## 2022-06-20 DIAGNOSIS — N186 End stage renal disease: Secondary | ICD-10-CM | POA: Diagnosis not present

## 2022-06-20 DIAGNOSIS — E876 Hypokalemia: Secondary | ICD-10-CM | POA: Diagnosis not present

## 2022-06-20 DIAGNOSIS — Z992 Dependence on renal dialysis: Secondary | ICD-10-CM | POA: Diagnosis not present

## 2022-06-20 DIAGNOSIS — D631 Anemia in chronic kidney disease: Secondary | ICD-10-CM | POA: Diagnosis not present

## 2022-06-20 DIAGNOSIS — E611 Iron deficiency: Secondary | ICD-10-CM | POA: Diagnosis not present

## 2022-06-20 DIAGNOSIS — N2581 Secondary hyperparathyroidism of renal origin: Secondary | ICD-10-CM | POA: Diagnosis not present

## 2022-06-20 DIAGNOSIS — L299 Pruritus, unspecified: Secondary | ICD-10-CM | POA: Diagnosis not present

## 2022-06-21 ENCOUNTER — Other Ambulatory Visit: Payer: Self-pay | Admitting: Internal Medicine

## 2022-06-21 DIAGNOSIS — B9689 Other specified bacterial agents as the cause of diseases classified elsewhere: Secondary | ICD-10-CM

## 2022-06-21 DIAGNOSIS — R1013 Epigastric pain: Secondary | ICD-10-CM

## 2022-06-22 DIAGNOSIS — D631 Anemia in chronic kidney disease: Secondary | ICD-10-CM | POA: Diagnosis not present

## 2022-06-22 DIAGNOSIS — N186 End stage renal disease: Secondary | ICD-10-CM | POA: Diagnosis not present

## 2022-06-22 DIAGNOSIS — I129 Hypertensive chronic kidney disease with stage 1 through stage 4 chronic kidney disease, or unspecified chronic kidney disease: Secondary | ICD-10-CM | POA: Diagnosis not present

## 2022-06-22 DIAGNOSIS — L299 Pruritus, unspecified: Secondary | ICD-10-CM | POA: Diagnosis not present

## 2022-06-22 DIAGNOSIS — E611 Iron deficiency: Secondary | ICD-10-CM | POA: Diagnosis not present

## 2022-06-22 DIAGNOSIS — N2581 Secondary hyperparathyroidism of renal origin: Secondary | ICD-10-CM | POA: Diagnosis not present

## 2022-06-22 DIAGNOSIS — Z992 Dependence on renal dialysis: Secondary | ICD-10-CM | POA: Diagnosis not present

## 2022-06-22 DIAGNOSIS — E876 Hypokalemia: Secondary | ICD-10-CM | POA: Diagnosis not present

## 2022-06-25 DIAGNOSIS — Z23 Encounter for immunization: Secondary | ICD-10-CM | POA: Diagnosis not present

## 2022-06-25 DIAGNOSIS — D631 Anemia in chronic kidney disease: Secondary | ICD-10-CM | POA: Diagnosis not present

## 2022-06-25 DIAGNOSIS — E611 Iron deficiency: Secondary | ICD-10-CM | POA: Diagnosis not present

## 2022-06-25 DIAGNOSIS — L299 Pruritus, unspecified: Secondary | ICD-10-CM | POA: Diagnosis not present

## 2022-06-25 DIAGNOSIS — E1129 Type 2 diabetes mellitus with other diabetic kidney complication: Secondary | ICD-10-CM | POA: Diagnosis not present

## 2022-06-25 DIAGNOSIS — N186 End stage renal disease: Secondary | ICD-10-CM | POA: Diagnosis not present

## 2022-06-25 DIAGNOSIS — N2581 Secondary hyperparathyroidism of renal origin: Secondary | ICD-10-CM | POA: Diagnosis not present

## 2022-06-25 DIAGNOSIS — E876 Hypokalemia: Secondary | ICD-10-CM | POA: Diagnosis not present

## 2022-06-25 DIAGNOSIS — Z992 Dependence on renal dialysis: Secondary | ICD-10-CM | POA: Diagnosis not present

## 2022-06-27 DIAGNOSIS — L299 Pruritus, unspecified: Secondary | ICD-10-CM | POA: Diagnosis not present

## 2022-06-27 DIAGNOSIS — D631 Anemia in chronic kidney disease: Secondary | ICD-10-CM | POA: Diagnosis not present

## 2022-06-27 DIAGNOSIS — E876 Hypokalemia: Secondary | ICD-10-CM | POA: Diagnosis not present

## 2022-06-27 DIAGNOSIS — N2581 Secondary hyperparathyroidism of renal origin: Secondary | ICD-10-CM | POA: Diagnosis not present

## 2022-06-27 DIAGNOSIS — Z992 Dependence on renal dialysis: Secondary | ICD-10-CM | POA: Diagnosis not present

## 2022-06-27 DIAGNOSIS — Z23 Encounter for immunization: Secondary | ICD-10-CM | POA: Diagnosis not present

## 2022-06-27 DIAGNOSIS — E611 Iron deficiency: Secondary | ICD-10-CM | POA: Diagnosis not present

## 2022-06-27 DIAGNOSIS — E1129 Type 2 diabetes mellitus with other diabetic kidney complication: Secondary | ICD-10-CM | POA: Diagnosis not present

## 2022-06-27 DIAGNOSIS — N186 End stage renal disease: Secondary | ICD-10-CM | POA: Diagnosis not present

## 2022-06-29 DIAGNOSIS — E876 Hypokalemia: Secondary | ICD-10-CM | POA: Diagnosis not present

## 2022-06-29 DIAGNOSIS — E1129 Type 2 diabetes mellitus with other diabetic kidney complication: Secondary | ICD-10-CM | POA: Diagnosis not present

## 2022-06-29 DIAGNOSIS — E611 Iron deficiency: Secondary | ICD-10-CM | POA: Diagnosis not present

## 2022-06-29 DIAGNOSIS — N186 End stage renal disease: Secondary | ICD-10-CM | POA: Diagnosis not present

## 2022-06-29 DIAGNOSIS — Z992 Dependence on renal dialysis: Secondary | ICD-10-CM | POA: Diagnosis not present

## 2022-06-29 DIAGNOSIS — Z23 Encounter for immunization: Secondary | ICD-10-CM | POA: Diagnosis not present

## 2022-06-29 DIAGNOSIS — N2581 Secondary hyperparathyroidism of renal origin: Secondary | ICD-10-CM | POA: Diagnosis not present

## 2022-06-29 DIAGNOSIS — L299 Pruritus, unspecified: Secondary | ICD-10-CM | POA: Diagnosis not present

## 2022-06-29 DIAGNOSIS — D631 Anemia in chronic kidney disease: Secondary | ICD-10-CM | POA: Diagnosis not present

## 2022-07-02 DIAGNOSIS — Z992 Dependence on renal dialysis: Secondary | ICD-10-CM | POA: Diagnosis not present

## 2022-07-02 DIAGNOSIS — E876 Hypokalemia: Secondary | ICD-10-CM | POA: Diagnosis not present

## 2022-07-02 DIAGNOSIS — Z23 Encounter for immunization: Secondary | ICD-10-CM | POA: Diagnosis not present

## 2022-07-02 DIAGNOSIS — E1129 Type 2 diabetes mellitus with other diabetic kidney complication: Secondary | ICD-10-CM | POA: Diagnosis not present

## 2022-07-02 DIAGNOSIS — D631 Anemia in chronic kidney disease: Secondary | ICD-10-CM | POA: Diagnosis not present

## 2022-07-02 DIAGNOSIS — E611 Iron deficiency: Secondary | ICD-10-CM | POA: Diagnosis not present

## 2022-07-02 DIAGNOSIS — L299 Pruritus, unspecified: Secondary | ICD-10-CM | POA: Diagnosis not present

## 2022-07-02 DIAGNOSIS — N2581 Secondary hyperparathyroidism of renal origin: Secondary | ICD-10-CM | POA: Diagnosis not present

## 2022-07-02 DIAGNOSIS — N186 End stage renal disease: Secondary | ICD-10-CM | POA: Diagnosis not present

## 2022-07-04 DIAGNOSIS — N2581 Secondary hyperparathyroidism of renal origin: Secondary | ICD-10-CM | POA: Diagnosis not present

## 2022-07-04 DIAGNOSIS — E876 Hypokalemia: Secondary | ICD-10-CM | POA: Diagnosis not present

## 2022-07-04 DIAGNOSIS — D631 Anemia in chronic kidney disease: Secondary | ICD-10-CM | POA: Diagnosis not present

## 2022-07-04 DIAGNOSIS — Z992 Dependence on renal dialysis: Secondary | ICD-10-CM | POA: Diagnosis not present

## 2022-07-04 DIAGNOSIS — E611 Iron deficiency: Secondary | ICD-10-CM | POA: Diagnosis not present

## 2022-07-04 DIAGNOSIS — E1129 Type 2 diabetes mellitus with other diabetic kidney complication: Secondary | ICD-10-CM | POA: Diagnosis not present

## 2022-07-04 DIAGNOSIS — N186 End stage renal disease: Secondary | ICD-10-CM | POA: Diagnosis not present

## 2022-07-04 DIAGNOSIS — Z23 Encounter for immunization: Secondary | ICD-10-CM | POA: Diagnosis not present

## 2022-07-04 DIAGNOSIS — L299 Pruritus, unspecified: Secondary | ICD-10-CM | POA: Diagnosis not present

## 2022-07-06 DIAGNOSIS — Z23 Encounter for immunization: Secondary | ICD-10-CM | POA: Diagnosis not present

## 2022-07-06 DIAGNOSIS — E876 Hypokalemia: Secondary | ICD-10-CM | POA: Diagnosis not present

## 2022-07-06 DIAGNOSIS — Z992 Dependence on renal dialysis: Secondary | ICD-10-CM | POA: Diagnosis not present

## 2022-07-06 DIAGNOSIS — E611 Iron deficiency: Secondary | ICD-10-CM | POA: Diagnosis not present

## 2022-07-06 DIAGNOSIS — E1129 Type 2 diabetes mellitus with other diabetic kidney complication: Secondary | ICD-10-CM | POA: Diagnosis not present

## 2022-07-06 DIAGNOSIS — D631 Anemia in chronic kidney disease: Secondary | ICD-10-CM | POA: Diagnosis not present

## 2022-07-06 DIAGNOSIS — N2581 Secondary hyperparathyroidism of renal origin: Secondary | ICD-10-CM | POA: Diagnosis not present

## 2022-07-06 DIAGNOSIS — N186 End stage renal disease: Secondary | ICD-10-CM | POA: Diagnosis not present

## 2022-07-06 DIAGNOSIS — L299 Pruritus, unspecified: Secondary | ICD-10-CM | POA: Diagnosis not present

## 2022-07-09 DIAGNOSIS — E1129 Type 2 diabetes mellitus with other diabetic kidney complication: Secondary | ICD-10-CM | POA: Diagnosis not present

## 2022-07-09 DIAGNOSIS — D631 Anemia in chronic kidney disease: Secondary | ICD-10-CM | POA: Diagnosis not present

## 2022-07-09 DIAGNOSIS — Z23 Encounter for immunization: Secondary | ICD-10-CM | POA: Diagnosis not present

## 2022-07-09 DIAGNOSIS — L299 Pruritus, unspecified: Secondary | ICD-10-CM | POA: Diagnosis not present

## 2022-07-09 DIAGNOSIS — N186 End stage renal disease: Secondary | ICD-10-CM | POA: Diagnosis not present

## 2022-07-09 DIAGNOSIS — E876 Hypokalemia: Secondary | ICD-10-CM | POA: Diagnosis not present

## 2022-07-09 DIAGNOSIS — N2581 Secondary hyperparathyroidism of renal origin: Secondary | ICD-10-CM | POA: Diagnosis not present

## 2022-07-09 DIAGNOSIS — Z992 Dependence on renal dialysis: Secondary | ICD-10-CM | POA: Diagnosis not present

## 2022-07-09 DIAGNOSIS — E611 Iron deficiency: Secondary | ICD-10-CM | POA: Diagnosis not present

## 2022-07-10 ENCOUNTER — Other Ambulatory Visit: Payer: Self-pay | Admitting: Student

## 2022-07-10 ENCOUNTER — Telehealth: Payer: Self-pay

## 2022-07-10 DIAGNOSIS — Z794 Long term (current) use of insulin: Secondary | ICD-10-CM

## 2022-07-10 NOTE — Telephone Encounter (Signed)
Is this for the Lantus? If so then patient needed lantus 100 units/mL 30 units (0.58m) twice daily.

## 2022-07-10 NOTE — Telephone Encounter (Addendum)
Sorry for any confusion.Yes this is for the lantus , Thanks Dr.Liang I called the pharmacy and clarified instructions.

## 2022-07-10 NOTE — Telephone Encounter (Signed)
Incoming fax from pharmacy there are 2 sets of instructions please clarify instructions for rx to be refilled. Inject 0.3 MlL (30 units total) into the skin two  times daily   Inject 0.34 ML into the skin once daily

## 2022-07-11 DIAGNOSIS — E876 Hypokalemia: Secondary | ICD-10-CM | POA: Diagnosis not present

## 2022-07-11 DIAGNOSIS — N186 End stage renal disease: Secondary | ICD-10-CM | POA: Diagnosis not present

## 2022-07-11 DIAGNOSIS — N2581 Secondary hyperparathyroidism of renal origin: Secondary | ICD-10-CM | POA: Diagnosis not present

## 2022-07-11 DIAGNOSIS — L299 Pruritus, unspecified: Secondary | ICD-10-CM | POA: Diagnosis not present

## 2022-07-11 DIAGNOSIS — D631 Anemia in chronic kidney disease: Secondary | ICD-10-CM | POA: Diagnosis not present

## 2022-07-11 DIAGNOSIS — E611 Iron deficiency: Secondary | ICD-10-CM | POA: Diagnosis not present

## 2022-07-11 DIAGNOSIS — E1129 Type 2 diabetes mellitus with other diabetic kidney complication: Secondary | ICD-10-CM | POA: Diagnosis not present

## 2022-07-11 DIAGNOSIS — Z992 Dependence on renal dialysis: Secondary | ICD-10-CM | POA: Diagnosis not present

## 2022-07-11 DIAGNOSIS — Z23 Encounter for immunization: Secondary | ICD-10-CM | POA: Diagnosis not present

## 2022-07-13 DIAGNOSIS — E876 Hypokalemia: Secondary | ICD-10-CM | POA: Diagnosis not present

## 2022-07-13 DIAGNOSIS — E611 Iron deficiency: Secondary | ICD-10-CM | POA: Diagnosis not present

## 2022-07-13 DIAGNOSIS — E1129 Type 2 diabetes mellitus with other diabetic kidney complication: Secondary | ICD-10-CM | POA: Diagnosis not present

## 2022-07-13 DIAGNOSIS — L299 Pruritus, unspecified: Secondary | ICD-10-CM | POA: Diagnosis not present

## 2022-07-13 DIAGNOSIS — N2581 Secondary hyperparathyroidism of renal origin: Secondary | ICD-10-CM | POA: Diagnosis not present

## 2022-07-13 DIAGNOSIS — Z992 Dependence on renal dialysis: Secondary | ICD-10-CM | POA: Diagnosis not present

## 2022-07-13 DIAGNOSIS — D631 Anemia in chronic kidney disease: Secondary | ICD-10-CM | POA: Diagnosis not present

## 2022-07-13 DIAGNOSIS — N186 End stage renal disease: Secondary | ICD-10-CM | POA: Diagnosis not present

## 2022-07-13 DIAGNOSIS — Z23 Encounter for immunization: Secondary | ICD-10-CM | POA: Diagnosis not present

## 2022-07-16 DIAGNOSIS — N2581 Secondary hyperparathyroidism of renal origin: Secondary | ICD-10-CM | POA: Diagnosis not present

## 2022-07-16 DIAGNOSIS — E876 Hypokalemia: Secondary | ICD-10-CM | POA: Diagnosis not present

## 2022-07-16 DIAGNOSIS — Z992 Dependence on renal dialysis: Secondary | ICD-10-CM | POA: Diagnosis not present

## 2022-07-16 DIAGNOSIS — Z23 Encounter for immunization: Secondary | ICD-10-CM | POA: Diagnosis not present

## 2022-07-16 DIAGNOSIS — E611 Iron deficiency: Secondary | ICD-10-CM | POA: Diagnosis not present

## 2022-07-16 DIAGNOSIS — L299 Pruritus, unspecified: Secondary | ICD-10-CM | POA: Diagnosis not present

## 2022-07-16 DIAGNOSIS — D631 Anemia in chronic kidney disease: Secondary | ICD-10-CM | POA: Diagnosis not present

## 2022-07-16 DIAGNOSIS — N186 End stage renal disease: Secondary | ICD-10-CM | POA: Diagnosis not present

## 2022-07-16 DIAGNOSIS — E1129 Type 2 diabetes mellitus with other diabetic kidney complication: Secondary | ICD-10-CM | POA: Diagnosis not present

## 2022-07-18 DIAGNOSIS — D631 Anemia in chronic kidney disease: Secondary | ICD-10-CM | POA: Diagnosis not present

## 2022-07-18 DIAGNOSIS — N186 End stage renal disease: Secondary | ICD-10-CM | POA: Diagnosis not present

## 2022-07-18 DIAGNOSIS — E876 Hypokalemia: Secondary | ICD-10-CM | POA: Diagnosis not present

## 2022-07-18 DIAGNOSIS — L299 Pruritus, unspecified: Secondary | ICD-10-CM | POA: Diagnosis not present

## 2022-07-18 DIAGNOSIS — Z23 Encounter for immunization: Secondary | ICD-10-CM | POA: Diagnosis not present

## 2022-07-18 DIAGNOSIS — E611 Iron deficiency: Secondary | ICD-10-CM | POA: Diagnosis not present

## 2022-07-18 DIAGNOSIS — Z992 Dependence on renal dialysis: Secondary | ICD-10-CM | POA: Diagnosis not present

## 2022-07-18 DIAGNOSIS — E1129 Type 2 diabetes mellitus with other diabetic kidney complication: Secondary | ICD-10-CM | POA: Diagnosis not present

## 2022-07-18 DIAGNOSIS — N2581 Secondary hyperparathyroidism of renal origin: Secondary | ICD-10-CM | POA: Diagnosis not present

## 2022-07-20 DIAGNOSIS — Z23 Encounter for immunization: Secondary | ICD-10-CM | POA: Diagnosis not present

## 2022-07-20 DIAGNOSIS — D631 Anemia in chronic kidney disease: Secondary | ICD-10-CM | POA: Diagnosis not present

## 2022-07-20 DIAGNOSIS — E1129 Type 2 diabetes mellitus with other diabetic kidney complication: Secondary | ICD-10-CM | POA: Diagnosis not present

## 2022-07-20 DIAGNOSIS — N186 End stage renal disease: Secondary | ICD-10-CM | POA: Diagnosis not present

## 2022-07-20 DIAGNOSIS — L299 Pruritus, unspecified: Secondary | ICD-10-CM | POA: Diagnosis not present

## 2022-07-20 DIAGNOSIS — Z992 Dependence on renal dialysis: Secondary | ICD-10-CM | POA: Diagnosis not present

## 2022-07-20 DIAGNOSIS — E876 Hypokalemia: Secondary | ICD-10-CM | POA: Diagnosis not present

## 2022-07-20 DIAGNOSIS — N2581 Secondary hyperparathyroidism of renal origin: Secondary | ICD-10-CM | POA: Diagnosis not present

## 2022-07-20 DIAGNOSIS — E611 Iron deficiency: Secondary | ICD-10-CM | POA: Diagnosis not present

## 2022-07-23 DIAGNOSIS — D631 Anemia in chronic kidney disease: Secondary | ICD-10-CM | POA: Diagnosis not present

## 2022-07-23 DIAGNOSIS — E1129 Type 2 diabetes mellitus with other diabetic kidney complication: Secondary | ICD-10-CM | POA: Diagnosis not present

## 2022-07-23 DIAGNOSIS — L299 Pruritus, unspecified: Secondary | ICD-10-CM | POA: Diagnosis not present

## 2022-07-23 DIAGNOSIS — I129 Hypertensive chronic kidney disease with stage 1 through stage 4 chronic kidney disease, or unspecified chronic kidney disease: Secondary | ICD-10-CM | POA: Diagnosis not present

## 2022-07-23 DIAGNOSIS — E611 Iron deficiency: Secondary | ICD-10-CM | POA: Diagnosis not present

## 2022-07-23 DIAGNOSIS — N186 End stage renal disease: Secondary | ICD-10-CM | POA: Diagnosis not present

## 2022-07-23 DIAGNOSIS — Z992 Dependence on renal dialysis: Secondary | ICD-10-CM | POA: Diagnosis not present

## 2022-07-23 DIAGNOSIS — Z23 Encounter for immunization: Secondary | ICD-10-CM | POA: Diagnosis not present

## 2022-07-23 DIAGNOSIS — E876 Hypokalemia: Secondary | ICD-10-CM | POA: Diagnosis not present

## 2022-07-23 DIAGNOSIS — N2581 Secondary hyperparathyroidism of renal origin: Secondary | ICD-10-CM | POA: Diagnosis not present

## 2022-07-25 DIAGNOSIS — D631 Anemia in chronic kidney disease: Secondary | ICD-10-CM | POA: Diagnosis not present

## 2022-07-25 DIAGNOSIS — Z992 Dependence on renal dialysis: Secondary | ICD-10-CM | POA: Diagnosis not present

## 2022-07-25 DIAGNOSIS — E611 Iron deficiency: Secondary | ICD-10-CM | POA: Diagnosis not present

## 2022-07-25 DIAGNOSIS — E876 Hypokalemia: Secondary | ICD-10-CM | POA: Diagnosis not present

## 2022-07-25 DIAGNOSIS — N186 End stage renal disease: Secondary | ICD-10-CM | POA: Diagnosis not present

## 2022-07-25 DIAGNOSIS — N2581 Secondary hyperparathyroidism of renal origin: Secondary | ICD-10-CM | POA: Diagnosis not present

## 2022-07-27 DIAGNOSIS — E611 Iron deficiency: Secondary | ICD-10-CM | POA: Diagnosis not present

## 2022-07-27 DIAGNOSIS — N186 End stage renal disease: Secondary | ICD-10-CM | POA: Diagnosis not present

## 2022-07-27 DIAGNOSIS — D631 Anemia in chronic kidney disease: Secondary | ICD-10-CM | POA: Diagnosis not present

## 2022-07-27 DIAGNOSIS — N2581 Secondary hyperparathyroidism of renal origin: Secondary | ICD-10-CM | POA: Diagnosis not present

## 2022-07-27 DIAGNOSIS — Z992 Dependence on renal dialysis: Secondary | ICD-10-CM | POA: Diagnosis not present

## 2022-07-27 DIAGNOSIS — E876 Hypokalemia: Secondary | ICD-10-CM | POA: Diagnosis not present

## 2022-07-30 DIAGNOSIS — N2581 Secondary hyperparathyroidism of renal origin: Secondary | ICD-10-CM | POA: Diagnosis not present

## 2022-07-30 DIAGNOSIS — E876 Hypokalemia: Secondary | ICD-10-CM | POA: Diagnosis not present

## 2022-07-30 DIAGNOSIS — N186 End stage renal disease: Secondary | ICD-10-CM | POA: Diagnosis not present

## 2022-07-30 DIAGNOSIS — Z992 Dependence on renal dialysis: Secondary | ICD-10-CM | POA: Diagnosis not present

## 2022-07-30 DIAGNOSIS — E611 Iron deficiency: Secondary | ICD-10-CM | POA: Diagnosis not present

## 2022-07-30 DIAGNOSIS — D631 Anemia in chronic kidney disease: Secondary | ICD-10-CM | POA: Diagnosis not present

## 2022-08-01 DIAGNOSIS — D631 Anemia in chronic kidney disease: Secondary | ICD-10-CM | POA: Diagnosis not present

## 2022-08-01 DIAGNOSIS — N186 End stage renal disease: Secondary | ICD-10-CM | POA: Diagnosis not present

## 2022-08-01 DIAGNOSIS — N2581 Secondary hyperparathyroidism of renal origin: Secondary | ICD-10-CM | POA: Diagnosis not present

## 2022-08-01 DIAGNOSIS — E611 Iron deficiency: Secondary | ICD-10-CM | POA: Diagnosis not present

## 2022-08-01 DIAGNOSIS — E876 Hypokalemia: Secondary | ICD-10-CM | POA: Diagnosis not present

## 2022-08-01 DIAGNOSIS — Z992 Dependence on renal dialysis: Secondary | ICD-10-CM | POA: Diagnosis not present

## 2022-08-03 DIAGNOSIS — E611 Iron deficiency: Secondary | ICD-10-CM | POA: Diagnosis not present

## 2022-08-03 DIAGNOSIS — N186 End stage renal disease: Secondary | ICD-10-CM | POA: Diagnosis not present

## 2022-08-03 DIAGNOSIS — Z992 Dependence on renal dialysis: Secondary | ICD-10-CM | POA: Diagnosis not present

## 2022-08-03 DIAGNOSIS — E876 Hypokalemia: Secondary | ICD-10-CM | POA: Diagnosis not present

## 2022-08-03 DIAGNOSIS — D631 Anemia in chronic kidney disease: Secondary | ICD-10-CM | POA: Diagnosis not present

## 2022-08-03 DIAGNOSIS — N2581 Secondary hyperparathyroidism of renal origin: Secondary | ICD-10-CM | POA: Diagnosis not present

## 2022-08-05 ENCOUNTER — Other Ambulatory Visit: Payer: Self-pay | Admitting: Internal Medicine

## 2022-08-05 DIAGNOSIS — I10 Essential (primary) hypertension: Secondary | ICD-10-CM

## 2022-08-06 DIAGNOSIS — Z992 Dependence on renal dialysis: Secondary | ICD-10-CM | POA: Diagnosis not present

## 2022-08-06 DIAGNOSIS — N2581 Secondary hyperparathyroidism of renal origin: Secondary | ICD-10-CM | POA: Diagnosis not present

## 2022-08-06 DIAGNOSIS — N186 End stage renal disease: Secondary | ICD-10-CM | POA: Diagnosis not present

## 2022-08-06 DIAGNOSIS — D631 Anemia in chronic kidney disease: Secondary | ICD-10-CM | POA: Diagnosis not present

## 2022-08-06 DIAGNOSIS — E611 Iron deficiency: Secondary | ICD-10-CM | POA: Diagnosis not present

## 2022-08-06 DIAGNOSIS — E876 Hypokalemia: Secondary | ICD-10-CM | POA: Diagnosis not present

## 2022-08-08 DIAGNOSIS — D631 Anemia in chronic kidney disease: Secondary | ICD-10-CM | POA: Diagnosis not present

## 2022-08-08 DIAGNOSIS — Z992 Dependence on renal dialysis: Secondary | ICD-10-CM | POA: Diagnosis not present

## 2022-08-08 DIAGNOSIS — E876 Hypokalemia: Secondary | ICD-10-CM | POA: Diagnosis not present

## 2022-08-08 DIAGNOSIS — N186 End stage renal disease: Secondary | ICD-10-CM | POA: Diagnosis not present

## 2022-08-08 DIAGNOSIS — E611 Iron deficiency: Secondary | ICD-10-CM | POA: Diagnosis not present

## 2022-08-08 DIAGNOSIS — N2581 Secondary hyperparathyroidism of renal origin: Secondary | ICD-10-CM | POA: Diagnosis not present

## 2022-08-10 DIAGNOSIS — N186 End stage renal disease: Secondary | ICD-10-CM | POA: Diagnosis not present

## 2022-08-10 DIAGNOSIS — D631 Anemia in chronic kidney disease: Secondary | ICD-10-CM | POA: Diagnosis not present

## 2022-08-10 DIAGNOSIS — N2581 Secondary hyperparathyroidism of renal origin: Secondary | ICD-10-CM | POA: Diagnosis not present

## 2022-08-10 DIAGNOSIS — E611 Iron deficiency: Secondary | ICD-10-CM | POA: Diagnosis not present

## 2022-08-10 DIAGNOSIS — E876 Hypokalemia: Secondary | ICD-10-CM | POA: Diagnosis not present

## 2022-08-10 DIAGNOSIS — Z992 Dependence on renal dialysis: Secondary | ICD-10-CM | POA: Diagnosis not present

## 2022-08-12 ENCOUNTER — Encounter: Payer: Medicare Other | Admitting: Student

## 2022-08-12 DIAGNOSIS — E611 Iron deficiency: Secondary | ICD-10-CM | POA: Diagnosis not present

## 2022-08-12 DIAGNOSIS — N2581 Secondary hyperparathyroidism of renal origin: Secondary | ICD-10-CM | POA: Diagnosis not present

## 2022-08-12 DIAGNOSIS — Z992 Dependence on renal dialysis: Secondary | ICD-10-CM | POA: Diagnosis not present

## 2022-08-12 DIAGNOSIS — E876 Hypokalemia: Secondary | ICD-10-CM | POA: Diagnosis not present

## 2022-08-12 DIAGNOSIS — N186 End stage renal disease: Secondary | ICD-10-CM | POA: Diagnosis not present

## 2022-08-12 DIAGNOSIS — D631 Anemia in chronic kidney disease: Secondary | ICD-10-CM | POA: Diagnosis not present

## 2022-08-13 ENCOUNTER — Encounter: Payer: Medicare Other | Admitting: Student

## 2022-08-14 DIAGNOSIS — D631 Anemia in chronic kidney disease: Secondary | ICD-10-CM | POA: Diagnosis not present

## 2022-08-14 DIAGNOSIS — N2581 Secondary hyperparathyroidism of renal origin: Secondary | ICD-10-CM | POA: Diagnosis not present

## 2022-08-14 DIAGNOSIS — E611 Iron deficiency: Secondary | ICD-10-CM | POA: Diagnosis not present

## 2022-08-14 DIAGNOSIS — E876 Hypokalemia: Secondary | ICD-10-CM | POA: Diagnosis not present

## 2022-08-14 DIAGNOSIS — N186 End stage renal disease: Secondary | ICD-10-CM | POA: Diagnosis not present

## 2022-08-14 DIAGNOSIS — Z992 Dependence on renal dialysis: Secondary | ICD-10-CM | POA: Diagnosis not present

## 2022-08-17 DIAGNOSIS — Z992 Dependence on renal dialysis: Secondary | ICD-10-CM | POA: Diagnosis not present

## 2022-08-17 DIAGNOSIS — E611 Iron deficiency: Secondary | ICD-10-CM | POA: Diagnosis not present

## 2022-08-17 DIAGNOSIS — N2581 Secondary hyperparathyroidism of renal origin: Secondary | ICD-10-CM | POA: Diagnosis not present

## 2022-08-17 DIAGNOSIS — E876 Hypokalemia: Secondary | ICD-10-CM | POA: Diagnosis not present

## 2022-08-17 DIAGNOSIS — N186 End stage renal disease: Secondary | ICD-10-CM | POA: Diagnosis not present

## 2022-08-17 DIAGNOSIS — D631 Anemia in chronic kidney disease: Secondary | ICD-10-CM | POA: Diagnosis not present

## 2022-08-20 DIAGNOSIS — N186 End stage renal disease: Secondary | ICD-10-CM | POA: Diagnosis not present

## 2022-08-20 DIAGNOSIS — N2581 Secondary hyperparathyroidism of renal origin: Secondary | ICD-10-CM | POA: Diagnosis not present

## 2022-08-20 DIAGNOSIS — D631 Anemia in chronic kidney disease: Secondary | ICD-10-CM | POA: Diagnosis not present

## 2022-08-20 DIAGNOSIS — E876 Hypokalemia: Secondary | ICD-10-CM | POA: Diagnosis not present

## 2022-08-20 DIAGNOSIS — Z992 Dependence on renal dialysis: Secondary | ICD-10-CM | POA: Diagnosis not present

## 2022-08-20 DIAGNOSIS — E611 Iron deficiency: Secondary | ICD-10-CM | POA: Diagnosis not present

## 2022-08-22 DIAGNOSIS — Z992 Dependence on renal dialysis: Secondary | ICD-10-CM | POA: Diagnosis not present

## 2022-08-22 DIAGNOSIS — E611 Iron deficiency: Secondary | ICD-10-CM | POA: Diagnosis not present

## 2022-08-22 DIAGNOSIS — D631 Anemia in chronic kidney disease: Secondary | ICD-10-CM | POA: Diagnosis not present

## 2022-08-22 DIAGNOSIS — N186 End stage renal disease: Secondary | ICD-10-CM | POA: Diagnosis not present

## 2022-08-22 DIAGNOSIS — E876 Hypokalemia: Secondary | ICD-10-CM | POA: Diagnosis not present

## 2022-08-22 DIAGNOSIS — N2581 Secondary hyperparathyroidism of renal origin: Secondary | ICD-10-CM | POA: Diagnosis not present

## 2022-08-22 DIAGNOSIS — I129 Hypertensive chronic kidney disease with stage 1 through stage 4 chronic kidney disease, or unspecified chronic kidney disease: Secondary | ICD-10-CM | POA: Diagnosis not present

## 2022-08-24 DIAGNOSIS — Z992 Dependence on renal dialysis: Secondary | ICD-10-CM | POA: Diagnosis not present

## 2022-08-24 DIAGNOSIS — E876 Hypokalemia: Secondary | ICD-10-CM | POA: Diagnosis not present

## 2022-08-24 DIAGNOSIS — D631 Anemia in chronic kidney disease: Secondary | ICD-10-CM | POA: Diagnosis not present

## 2022-08-24 DIAGNOSIS — L299 Pruritus, unspecified: Secondary | ICD-10-CM | POA: Diagnosis not present

## 2022-08-24 DIAGNOSIS — N186 End stage renal disease: Secondary | ICD-10-CM | POA: Diagnosis not present

## 2022-08-24 DIAGNOSIS — E611 Iron deficiency: Secondary | ICD-10-CM | POA: Diagnosis not present

## 2022-08-24 DIAGNOSIS — N2581 Secondary hyperparathyroidism of renal origin: Secondary | ICD-10-CM | POA: Diagnosis not present

## 2022-08-27 DIAGNOSIS — N2581 Secondary hyperparathyroidism of renal origin: Secondary | ICD-10-CM | POA: Diagnosis not present

## 2022-08-27 DIAGNOSIS — E611 Iron deficiency: Secondary | ICD-10-CM | POA: Diagnosis not present

## 2022-08-27 DIAGNOSIS — D631 Anemia in chronic kidney disease: Secondary | ICD-10-CM | POA: Diagnosis not present

## 2022-08-27 DIAGNOSIS — L299 Pruritus, unspecified: Secondary | ICD-10-CM | POA: Diagnosis not present

## 2022-08-27 DIAGNOSIS — N186 End stage renal disease: Secondary | ICD-10-CM | POA: Diagnosis not present

## 2022-08-27 DIAGNOSIS — E876 Hypokalemia: Secondary | ICD-10-CM | POA: Diagnosis not present

## 2022-08-27 DIAGNOSIS — Z992 Dependence on renal dialysis: Secondary | ICD-10-CM | POA: Diagnosis not present

## 2022-08-29 DIAGNOSIS — E876 Hypokalemia: Secondary | ICD-10-CM | POA: Diagnosis not present

## 2022-08-29 DIAGNOSIS — L299 Pruritus, unspecified: Secondary | ICD-10-CM | POA: Diagnosis not present

## 2022-08-29 DIAGNOSIS — N186 End stage renal disease: Secondary | ICD-10-CM | POA: Diagnosis not present

## 2022-08-29 DIAGNOSIS — D631 Anemia in chronic kidney disease: Secondary | ICD-10-CM | POA: Diagnosis not present

## 2022-08-29 DIAGNOSIS — N2581 Secondary hyperparathyroidism of renal origin: Secondary | ICD-10-CM | POA: Diagnosis not present

## 2022-08-29 DIAGNOSIS — Z992 Dependence on renal dialysis: Secondary | ICD-10-CM | POA: Diagnosis not present

## 2022-08-29 DIAGNOSIS — E611 Iron deficiency: Secondary | ICD-10-CM | POA: Diagnosis not present

## 2022-08-31 DIAGNOSIS — N186 End stage renal disease: Secondary | ICD-10-CM | POA: Diagnosis not present

## 2022-08-31 DIAGNOSIS — E876 Hypokalemia: Secondary | ICD-10-CM | POA: Diagnosis not present

## 2022-08-31 DIAGNOSIS — N2581 Secondary hyperparathyroidism of renal origin: Secondary | ICD-10-CM | POA: Diagnosis not present

## 2022-08-31 DIAGNOSIS — D631 Anemia in chronic kidney disease: Secondary | ICD-10-CM | POA: Diagnosis not present

## 2022-08-31 DIAGNOSIS — Z992 Dependence on renal dialysis: Secondary | ICD-10-CM | POA: Diagnosis not present

## 2022-08-31 DIAGNOSIS — L299 Pruritus, unspecified: Secondary | ICD-10-CM | POA: Diagnosis not present

## 2022-08-31 DIAGNOSIS — E611 Iron deficiency: Secondary | ICD-10-CM | POA: Diagnosis not present

## 2022-09-03 DIAGNOSIS — N2581 Secondary hyperparathyroidism of renal origin: Secondary | ICD-10-CM | POA: Diagnosis not present

## 2022-09-03 DIAGNOSIS — N186 End stage renal disease: Secondary | ICD-10-CM | POA: Diagnosis not present

## 2022-09-03 DIAGNOSIS — E611 Iron deficiency: Secondary | ICD-10-CM | POA: Diagnosis not present

## 2022-09-03 DIAGNOSIS — E876 Hypokalemia: Secondary | ICD-10-CM | POA: Diagnosis not present

## 2022-09-03 DIAGNOSIS — L299 Pruritus, unspecified: Secondary | ICD-10-CM | POA: Diagnosis not present

## 2022-09-03 DIAGNOSIS — D631 Anemia in chronic kidney disease: Secondary | ICD-10-CM | POA: Diagnosis not present

## 2022-09-03 DIAGNOSIS — Z992 Dependence on renal dialysis: Secondary | ICD-10-CM | POA: Diagnosis not present

## 2022-09-04 DIAGNOSIS — I871 Compression of vein: Secondary | ICD-10-CM | POA: Diagnosis not present

## 2022-09-04 DIAGNOSIS — Z992 Dependence on renal dialysis: Secondary | ICD-10-CM | POA: Diagnosis not present

## 2022-09-04 DIAGNOSIS — T82898A Other specified complication of vascular prosthetic devices, implants and grafts, initial encounter: Secondary | ICD-10-CM | POA: Diagnosis not present

## 2022-09-04 DIAGNOSIS — N186 End stage renal disease: Secondary | ICD-10-CM | POA: Diagnosis not present

## 2022-09-05 DIAGNOSIS — E611 Iron deficiency: Secondary | ICD-10-CM | POA: Diagnosis not present

## 2022-09-05 DIAGNOSIS — L299 Pruritus, unspecified: Secondary | ICD-10-CM | POA: Diagnosis not present

## 2022-09-05 DIAGNOSIS — N2581 Secondary hyperparathyroidism of renal origin: Secondary | ICD-10-CM | POA: Diagnosis not present

## 2022-09-05 DIAGNOSIS — E876 Hypokalemia: Secondary | ICD-10-CM | POA: Diagnosis not present

## 2022-09-05 DIAGNOSIS — N186 End stage renal disease: Secondary | ICD-10-CM | POA: Diagnosis not present

## 2022-09-05 DIAGNOSIS — D631 Anemia in chronic kidney disease: Secondary | ICD-10-CM | POA: Diagnosis not present

## 2022-09-05 DIAGNOSIS — Z992 Dependence on renal dialysis: Secondary | ICD-10-CM | POA: Diagnosis not present

## 2022-09-07 DIAGNOSIS — N2581 Secondary hyperparathyroidism of renal origin: Secondary | ICD-10-CM | POA: Diagnosis not present

## 2022-09-07 DIAGNOSIS — E876 Hypokalemia: Secondary | ICD-10-CM | POA: Diagnosis not present

## 2022-09-07 DIAGNOSIS — N186 End stage renal disease: Secondary | ICD-10-CM | POA: Diagnosis not present

## 2022-09-07 DIAGNOSIS — Z992 Dependence on renal dialysis: Secondary | ICD-10-CM | POA: Diagnosis not present

## 2022-09-07 DIAGNOSIS — D631 Anemia in chronic kidney disease: Secondary | ICD-10-CM | POA: Diagnosis not present

## 2022-09-07 DIAGNOSIS — E611 Iron deficiency: Secondary | ICD-10-CM | POA: Diagnosis not present

## 2022-09-07 DIAGNOSIS — L299 Pruritus, unspecified: Secondary | ICD-10-CM | POA: Diagnosis not present

## 2022-09-10 DIAGNOSIS — Z992 Dependence on renal dialysis: Secondary | ICD-10-CM | POA: Diagnosis not present

## 2022-09-10 DIAGNOSIS — N186 End stage renal disease: Secondary | ICD-10-CM | POA: Diagnosis not present

## 2022-09-10 DIAGNOSIS — D631 Anemia in chronic kidney disease: Secondary | ICD-10-CM | POA: Diagnosis not present

## 2022-09-10 DIAGNOSIS — E611 Iron deficiency: Secondary | ICD-10-CM | POA: Diagnosis not present

## 2022-09-10 DIAGNOSIS — N2581 Secondary hyperparathyroidism of renal origin: Secondary | ICD-10-CM | POA: Diagnosis not present

## 2022-09-10 DIAGNOSIS — E876 Hypokalemia: Secondary | ICD-10-CM | POA: Diagnosis not present

## 2022-09-10 DIAGNOSIS — L299 Pruritus, unspecified: Secondary | ICD-10-CM | POA: Diagnosis not present

## 2022-09-12 DIAGNOSIS — N186 End stage renal disease: Secondary | ICD-10-CM | POA: Diagnosis not present

## 2022-09-12 DIAGNOSIS — Z992 Dependence on renal dialysis: Secondary | ICD-10-CM | POA: Diagnosis not present

## 2022-09-12 DIAGNOSIS — N2581 Secondary hyperparathyroidism of renal origin: Secondary | ICD-10-CM | POA: Diagnosis not present

## 2022-09-12 DIAGNOSIS — E876 Hypokalemia: Secondary | ICD-10-CM | POA: Diagnosis not present

## 2022-09-12 DIAGNOSIS — L299 Pruritus, unspecified: Secondary | ICD-10-CM | POA: Diagnosis not present

## 2022-09-12 DIAGNOSIS — E611 Iron deficiency: Secondary | ICD-10-CM | POA: Diagnosis not present

## 2022-09-12 DIAGNOSIS — D631 Anemia in chronic kidney disease: Secondary | ICD-10-CM | POA: Diagnosis not present

## 2022-09-14 DIAGNOSIS — E611 Iron deficiency: Secondary | ICD-10-CM | POA: Diagnosis not present

## 2022-09-14 DIAGNOSIS — E876 Hypokalemia: Secondary | ICD-10-CM | POA: Diagnosis not present

## 2022-09-14 DIAGNOSIS — D631 Anemia in chronic kidney disease: Secondary | ICD-10-CM | POA: Diagnosis not present

## 2022-09-14 DIAGNOSIS — Z992 Dependence on renal dialysis: Secondary | ICD-10-CM | POA: Diagnosis not present

## 2022-09-14 DIAGNOSIS — N186 End stage renal disease: Secondary | ICD-10-CM | POA: Diagnosis not present

## 2022-09-14 DIAGNOSIS — N2581 Secondary hyperparathyroidism of renal origin: Secondary | ICD-10-CM | POA: Diagnosis not present

## 2022-09-14 DIAGNOSIS — L299 Pruritus, unspecified: Secondary | ICD-10-CM | POA: Diagnosis not present

## 2022-09-17 DIAGNOSIS — E611 Iron deficiency: Secondary | ICD-10-CM | POA: Diagnosis not present

## 2022-09-17 DIAGNOSIS — N2581 Secondary hyperparathyroidism of renal origin: Secondary | ICD-10-CM | POA: Diagnosis not present

## 2022-09-17 DIAGNOSIS — Z992 Dependence on renal dialysis: Secondary | ICD-10-CM | POA: Diagnosis not present

## 2022-09-17 DIAGNOSIS — E876 Hypokalemia: Secondary | ICD-10-CM | POA: Diagnosis not present

## 2022-09-17 DIAGNOSIS — N186 End stage renal disease: Secondary | ICD-10-CM | POA: Diagnosis not present

## 2022-09-17 DIAGNOSIS — D631 Anemia in chronic kidney disease: Secondary | ICD-10-CM | POA: Diagnosis not present

## 2022-09-17 DIAGNOSIS — L299 Pruritus, unspecified: Secondary | ICD-10-CM | POA: Diagnosis not present

## 2022-09-19 DIAGNOSIS — N2581 Secondary hyperparathyroidism of renal origin: Secondary | ICD-10-CM | POA: Diagnosis not present

## 2022-09-19 DIAGNOSIS — E611 Iron deficiency: Secondary | ICD-10-CM | POA: Diagnosis not present

## 2022-09-19 DIAGNOSIS — E876 Hypokalemia: Secondary | ICD-10-CM | POA: Diagnosis not present

## 2022-09-19 DIAGNOSIS — Z992 Dependence on renal dialysis: Secondary | ICD-10-CM | POA: Diagnosis not present

## 2022-09-19 DIAGNOSIS — N186 End stage renal disease: Secondary | ICD-10-CM | POA: Diagnosis not present

## 2022-09-19 DIAGNOSIS — D631 Anemia in chronic kidney disease: Secondary | ICD-10-CM | POA: Diagnosis not present

## 2022-09-19 DIAGNOSIS — L299 Pruritus, unspecified: Secondary | ICD-10-CM | POA: Diagnosis not present

## 2022-09-21 DIAGNOSIS — D631 Anemia in chronic kidney disease: Secondary | ICD-10-CM | POA: Diagnosis not present

## 2022-09-21 DIAGNOSIS — N186 End stage renal disease: Secondary | ICD-10-CM | POA: Diagnosis not present

## 2022-09-21 DIAGNOSIS — N2581 Secondary hyperparathyroidism of renal origin: Secondary | ICD-10-CM | POA: Diagnosis not present

## 2022-09-21 DIAGNOSIS — E876 Hypokalemia: Secondary | ICD-10-CM | POA: Diagnosis not present

## 2022-09-21 DIAGNOSIS — Z992 Dependence on renal dialysis: Secondary | ICD-10-CM | POA: Diagnosis not present

## 2022-09-21 DIAGNOSIS — L299 Pruritus, unspecified: Secondary | ICD-10-CM | POA: Diagnosis not present

## 2022-09-21 DIAGNOSIS — E611 Iron deficiency: Secondary | ICD-10-CM | POA: Diagnosis not present

## 2022-09-22 DIAGNOSIS — N186 End stage renal disease: Secondary | ICD-10-CM | POA: Diagnosis not present

## 2022-09-22 DIAGNOSIS — Z992 Dependence on renal dialysis: Secondary | ICD-10-CM | POA: Diagnosis not present

## 2022-09-22 DIAGNOSIS — I129 Hypertensive chronic kidney disease with stage 1 through stage 4 chronic kidney disease, or unspecified chronic kidney disease: Secondary | ICD-10-CM | POA: Diagnosis not present

## 2022-09-23 ENCOUNTER — Other Ambulatory Visit: Payer: Self-pay | Admitting: Student

## 2022-09-23 DIAGNOSIS — I10 Essential (primary) hypertension: Secondary | ICD-10-CM

## 2022-09-24 DIAGNOSIS — N2581 Secondary hyperparathyroidism of renal origin: Secondary | ICD-10-CM | POA: Diagnosis not present

## 2022-09-24 DIAGNOSIS — N186 End stage renal disease: Secondary | ICD-10-CM | POA: Diagnosis not present

## 2022-09-24 DIAGNOSIS — Z992 Dependence on renal dialysis: Secondary | ICD-10-CM | POA: Diagnosis not present

## 2022-09-24 DIAGNOSIS — E876 Hypokalemia: Secondary | ICD-10-CM | POA: Diagnosis not present

## 2022-09-24 DIAGNOSIS — D631 Anemia in chronic kidney disease: Secondary | ICD-10-CM | POA: Diagnosis not present

## 2022-09-24 DIAGNOSIS — E1129 Type 2 diabetes mellitus with other diabetic kidney complication: Secondary | ICD-10-CM | POA: Diagnosis not present

## 2022-09-24 DIAGNOSIS — E611 Iron deficiency: Secondary | ICD-10-CM | POA: Diagnosis not present

## 2022-09-26 DIAGNOSIS — E1129 Type 2 diabetes mellitus with other diabetic kidney complication: Secondary | ICD-10-CM | POA: Diagnosis not present

## 2022-09-26 DIAGNOSIS — Z992 Dependence on renal dialysis: Secondary | ICD-10-CM | POA: Diagnosis not present

## 2022-09-26 DIAGNOSIS — E876 Hypokalemia: Secondary | ICD-10-CM | POA: Diagnosis not present

## 2022-09-26 DIAGNOSIS — N2581 Secondary hyperparathyroidism of renal origin: Secondary | ICD-10-CM | POA: Diagnosis not present

## 2022-09-26 DIAGNOSIS — N186 End stage renal disease: Secondary | ICD-10-CM | POA: Diagnosis not present

## 2022-09-26 DIAGNOSIS — E611 Iron deficiency: Secondary | ICD-10-CM | POA: Diagnosis not present

## 2022-09-26 DIAGNOSIS — D631 Anemia in chronic kidney disease: Secondary | ICD-10-CM | POA: Diagnosis not present

## 2022-09-28 DIAGNOSIS — Z992 Dependence on renal dialysis: Secondary | ICD-10-CM | POA: Diagnosis not present

## 2022-09-28 DIAGNOSIS — E1129 Type 2 diabetes mellitus with other diabetic kidney complication: Secondary | ICD-10-CM | POA: Diagnosis not present

## 2022-09-28 DIAGNOSIS — N186 End stage renal disease: Secondary | ICD-10-CM | POA: Diagnosis not present

## 2022-09-28 DIAGNOSIS — E876 Hypokalemia: Secondary | ICD-10-CM | POA: Diagnosis not present

## 2022-09-28 DIAGNOSIS — E611 Iron deficiency: Secondary | ICD-10-CM | POA: Diagnosis not present

## 2022-09-28 DIAGNOSIS — N2581 Secondary hyperparathyroidism of renal origin: Secondary | ICD-10-CM | POA: Diagnosis not present

## 2022-09-28 DIAGNOSIS — D631 Anemia in chronic kidney disease: Secondary | ICD-10-CM | POA: Diagnosis not present

## 2022-10-01 DIAGNOSIS — E1129 Type 2 diabetes mellitus with other diabetic kidney complication: Secondary | ICD-10-CM | POA: Diagnosis not present

## 2022-10-01 DIAGNOSIS — E876 Hypokalemia: Secondary | ICD-10-CM | POA: Diagnosis not present

## 2022-10-01 DIAGNOSIS — Z992 Dependence on renal dialysis: Secondary | ICD-10-CM | POA: Diagnosis not present

## 2022-10-01 DIAGNOSIS — E611 Iron deficiency: Secondary | ICD-10-CM | POA: Diagnosis not present

## 2022-10-01 DIAGNOSIS — N2581 Secondary hyperparathyroidism of renal origin: Secondary | ICD-10-CM | POA: Diagnosis not present

## 2022-10-01 DIAGNOSIS — D631 Anemia in chronic kidney disease: Secondary | ICD-10-CM | POA: Diagnosis not present

## 2022-10-01 DIAGNOSIS — N186 End stage renal disease: Secondary | ICD-10-CM | POA: Diagnosis not present

## 2022-10-03 DIAGNOSIS — D631 Anemia in chronic kidney disease: Secondary | ICD-10-CM | POA: Diagnosis not present

## 2022-10-03 DIAGNOSIS — E876 Hypokalemia: Secondary | ICD-10-CM | POA: Diagnosis not present

## 2022-10-03 DIAGNOSIS — E1129 Type 2 diabetes mellitus with other diabetic kidney complication: Secondary | ICD-10-CM | POA: Diagnosis not present

## 2022-10-03 DIAGNOSIS — N2581 Secondary hyperparathyroidism of renal origin: Secondary | ICD-10-CM | POA: Diagnosis not present

## 2022-10-03 DIAGNOSIS — E611 Iron deficiency: Secondary | ICD-10-CM | POA: Diagnosis not present

## 2022-10-03 DIAGNOSIS — Z992 Dependence on renal dialysis: Secondary | ICD-10-CM | POA: Diagnosis not present

## 2022-10-03 DIAGNOSIS — N186 End stage renal disease: Secondary | ICD-10-CM | POA: Diagnosis not present

## 2022-10-05 DIAGNOSIS — E611 Iron deficiency: Secondary | ICD-10-CM | POA: Diagnosis not present

## 2022-10-05 DIAGNOSIS — N2581 Secondary hyperparathyroidism of renal origin: Secondary | ICD-10-CM | POA: Diagnosis not present

## 2022-10-05 DIAGNOSIS — E1129 Type 2 diabetes mellitus with other diabetic kidney complication: Secondary | ICD-10-CM | POA: Diagnosis not present

## 2022-10-05 DIAGNOSIS — D631 Anemia in chronic kidney disease: Secondary | ICD-10-CM | POA: Diagnosis not present

## 2022-10-05 DIAGNOSIS — N186 End stage renal disease: Secondary | ICD-10-CM | POA: Diagnosis not present

## 2022-10-05 DIAGNOSIS — Z992 Dependence on renal dialysis: Secondary | ICD-10-CM | POA: Diagnosis not present

## 2022-10-05 DIAGNOSIS — E876 Hypokalemia: Secondary | ICD-10-CM | POA: Diagnosis not present

## 2022-10-08 DIAGNOSIS — Z992 Dependence on renal dialysis: Secondary | ICD-10-CM | POA: Diagnosis not present

## 2022-10-08 DIAGNOSIS — E1129 Type 2 diabetes mellitus with other diabetic kidney complication: Secondary | ICD-10-CM | POA: Diagnosis not present

## 2022-10-08 DIAGNOSIS — N186 End stage renal disease: Secondary | ICD-10-CM | POA: Diagnosis not present

## 2022-10-08 DIAGNOSIS — D631 Anemia in chronic kidney disease: Secondary | ICD-10-CM | POA: Diagnosis not present

## 2022-10-08 DIAGNOSIS — E611 Iron deficiency: Secondary | ICD-10-CM | POA: Diagnosis not present

## 2022-10-08 DIAGNOSIS — N2581 Secondary hyperparathyroidism of renal origin: Secondary | ICD-10-CM | POA: Diagnosis not present

## 2022-10-08 DIAGNOSIS — E876 Hypokalemia: Secondary | ICD-10-CM | POA: Diagnosis not present

## 2022-10-10 DIAGNOSIS — N186 End stage renal disease: Secondary | ICD-10-CM | POA: Diagnosis not present

## 2022-10-10 DIAGNOSIS — E876 Hypokalemia: Secondary | ICD-10-CM | POA: Diagnosis not present

## 2022-10-10 DIAGNOSIS — E611 Iron deficiency: Secondary | ICD-10-CM | POA: Diagnosis not present

## 2022-10-10 DIAGNOSIS — Z992 Dependence on renal dialysis: Secondary | ICD-10-CM | POA: Diagnosis not present

## 2022-10-10 DIAGNOSIS — E1129 Type 2 diabetes mellitus with other diabetic kidney complication: Secondary | ICD-10-CM | POA: Diagnosis not present

## 2022-10-10 DIAGNOSIS — D631 Anemia in chronic kidney disease: Secondary | ICD-10-CM | POA: Diagnosis not present

## 2022-10-10 DIAGNOSIS — N2581 Secondary hyperparathyroidism of renal origin: Secondary | ICD-10-CM | POA: Diagnosis not present

## 2022-10-12 DIAGNOSIS — N2581 Secondary hyperparathyroidism of renal origin: Secondary | ICD-10-CM | POA: Diagnosis not present

## 2022-10-12 DIAGNOSIS — D631 Anemia in chronic kidney disease: Secondary | ICD-10-CM | POA: Diagnosis not present

## 2022-10-12 DIAGNOSIS — E876 Hypokalemia: Secondary | ICD-10-CM | POA: Diagnosis not present

## 2022-10-12 DIAGNOSIS — N186 End stage renal disease: Secondary | ICD-10-CM | POA: Diagnosis not present

## 2022-10-12 DIAGNOSIS — E1129 Type 2 diabetes mellitus with other diabetic kidney complication: Secondary | ICD-10-CM | POA: Diagnosis not present

## 2022-10-12 DIAGNOSIS — E611 Iron deficiency: Secondary | ICD-10-CM | POA: Diagnosis not present

## 2022-10-12 DIAGNOSIS — Z992 Dependence on renal dialysis: Secondary | ICD-10-CM | POA: Diagnosis not present

## 2022-10-15 DIAGNOSIS — E876 Hypokalemia: Secondary | ICD-10-CM | POA: Diagnosis not present

## 2022-10-15 DIAGNOSIS — N186 End stage renal disease: Secondary | ICD-10-CM | POA: Diagnosis not present

## 2022-10-15 DIAGNOSIS — Z992 Dependence on renal dialysis: Secondary | ICD-10-CM | POA: Diagnosis not present

## 2022-10-15 DIAGNOSIS — N2581 Secondary hyperparathyroidism of renal origin: Secondary | ICD-10-CM | POA: Diagnosis not present

## 2022-10-15 DIAGNOSIS — D631 Anemia in chronic kidney disease: Secondary | ICD-10-CM | POA: Diagnosis not present

## 2022-10-15 DIAGNOSIS — E1129 Type 2 diabetes mellitus with other diabetic kidney complication: Secondary | ICD-10-CM | POA: Diagnosis not present

## 2022-10-15 DIAGNOSIS — E611 Iron deficiency: Secondary | ICD-10-CM | POA: Diagnosis not present

## 2022-10-17 DIAGNOSIS — N186 End stage renal disease: Secondary | ICD-10-CM | POA: Diagnosis not present

## 2022-10-17 DIAGNOSIS — D631 Anemia in chronic kidney disease: Secondary | ICD-10-CM | POA: Diagnosis not present

## 2022-10-17 DIAGNOSIS — E611 Iron deficiency: Secondary | ICD-10-CM | POA: Diagnosis not present

## 2022-10-17 DIAGNOSIS — N2581 Secondary hyperparathyroidism of renal origin: Secondary | ICD-10-CM | POA: Diagnosis not present

## 2022-10-17 DIAGNOSIS — Z992 Dependence on renal dialysis: Secondary | ICD-10-CM | POA: Diagnosis not present

## 2022-10-17 DIAGNOSIS — E876 Hypokalemia: Secondary | ICD-10-CM | POA: Diagnosis not present

## 2022-10-17 DIAGNOSIS — E1129 Type 2 diabetes mellitus with other diabetic kidney complication: Secondary | ICD-10-CM | POA: Diagnosis not present

## 2022-10-19 DIAGNOSIS — N2581 Secondary hyperparathyroidism of renal origin: Secondary | ICD-10-CM | POA: Diagnosis not present

## 2022-10-19 DIAGNOSIS — E611 Iron deficiency: Secondary | ICD-10-CM | POA: Diagnosis not present

## 2022-10-19 DIAGNOSIS — Z992 Dependence on renal dialysis: Secondary | ICD-10-CM | POA: Diagnosis not present

## 2022-10-19 DIAGNOSIS — E876 Hypokalemia: Secondary | ICD-10-CM | POA: Diagnosis not present

## 2022-10-19 DIAGNOSIS — D631 Anemia in chronic kidney disease: Secondary | ICD-10-CM | POA: Diagnosis not present

## 2022-10-19 DIAGNOSIS — N186 End stage renal disease: Secondary | ICD-10-CM | POA: Diagnosis not present

## 2022-10-19 DIAGNOSIS — E1129 Type 2 diabetes mellitus with other diabetic kidney complication: Secondary | ICD-10-CM | POA: Diagnosis not present

## 2022-10-22 DIAGNOSIS — N2581 Secondary hyperparathyroidism of renal origin: Secondary | ICD-10-CM | POA: Diagnosis not present

## 2022-10-22 DIAGNOSIS — E611 Iron deficiency: Secondary | ICD-10-CM | POA: Diagnosis not present

## 2022-10-22 DIAGNOSIS — E876 Hypokalemia: Secondary | ICD-10-CM | POA: Diagnosis not present

## 2022-10-22 DIAGNOSIS — E1129 Type 2 diabetes mellitus with other diabetic kidney complication: Secondary | ICD-10-CM | POA: Diagnosis not present

## 2022-10-22 DIAGNOSIS — Z992 Dependence on renal dialysis: Secondary | ICD-10-CM | POA: Diagnosis not present

## 2022-10-22 DIAGNOSIS — D631 Anemia in chronic kidney disease: Secondary | ICD-10-CM | POA: Diagnosis not present

## 2022-10-22 DIAGNOSIS — N186 End stage renal disease: Secondary | ICD-10-CM | POA: Diagnosis not present

## 2022-10-23 DIAGNOSIS — Z992 Dependence on renal dialysis: Secondary | ICD-10-CM | POA: Diagnosis not present

## 2022-10-23 DIAGNOSIS — I129 Hypertensive chronic kidney disease with stage 1 through stage 4 chronic kidney disease, or unspecified chronic kidney disease: Secondary | ICD-10-CM | POA: Diagnosis not present

## 2022-10-23 DIAGNOSIS — I871 Compression of vein: Secondary | ICD-10-CM | POA: Diagnosis not present

## 2022-10-23 DIAGNOSIS — N186 End stage renal disease: Secondary | ICD-10-CM | POA: Diagnosis not present

## 2022-10-24 DIAGNOSIS — N2581 Secondary hyperparathyroidism of renal origin: Secondary | ICD-10-CM | POA: Diagnosis not present

## 2022-10-24 DIAGNOSIS — N186 End stage renal disease: Secondary | ICD-10-CM | POA: Diagnosis not present

## 2022-10-24 DIAGNOSIS — Z992 Dependence on renal dialysis: Secondary | ICD-10-CM | POA: Diagnosis not present

## 2022-10-24 DIAGNOSIS — E611 Iron deficiency: Secondary | ICD-10-CM | POA: Diagnosis not present

## 2022-10-24 DIAGNOSIS — D631 Anemia in chronic kidney disease: Secondary | ICD-10-CM | POA: Diagnosis not present

## 2022-10-24 DIAGNOSIS — E876 Hypokalemia: Secondary | ICD-10-CM | POA: Diagnosis not present

## 2022-10-26 DIAGNOSIS — N2581 Secondary hyperparathyroidism of renal origin: Secondary | ICD-10-CM | POA: Diagnosis not present

## 2022-10-26 DIAGNOSIS — D631 Anemia in chronic kidney disease: Secondary | ICD-10-CM | POA: Diagnosis not present

## 2022-10-26 DIAGNOSIS — N186 End stage renal disease: Secondary | ICD-10-CM | POA: Diagnosis not present

## 2022-10-26 DIAGNOSIS — E876 Hypokalemia: Secondary | ICD-10-CM | POA: Diagnosis not present

## 2022-10-26 DIAGNOSIS — E611 Iron deficiency: Secondary | ICD-10-CM | POA: Diagnosis not present

## 2022-10-26 DIAGNOSIS — Z992 Dependence on renal dialysis: Secondary | ICD-10-CM | POA: Diagnosis not present

## 2022-10-29 DIAGNOSIS — E611 Iron deficiency: Secondary | ICD-10-CM | POA: Diagnosis not present

## 2022-10-29 DIAGNOSIS — N2581 Secondary hyperparathyroidism of renal origin: Secondary | ICD-10-CM | POA: Diagnosis not present

## 2022-10-29 DIAGNOSIS — D631 Anemia in chronic kidney disease: Secondary | ICD-10-CM | POA: Diagnosis not present

## 2022-10-29 DIAGNOSIS — Z992 Dependence on renal dialysis: Secondary | ICD-10-CM | POA: Diagnosis not present

## 2022-10-29 DIAGNOSIS — E876 Hypokalemia: Secondary | ICD-10-CM | POA: Diagnosis not present

## 2022-10-29 DIAGNOSIS — N186 End stage renal disease: Secondary | ICD-10-CM | POA: Diagnosis not present

## 2022-10-31 DIAGNOSIS — Z992 Dependence on renal dialysis: Secondary | ICD-10-CM | POA: Diagnosis not present

## 2022-10-31 DIAGNOSIS — N186 End stage renal disease: Secondary | ICD-10-CM | POA: Diagnosis not present

## 2022-10-31 DIAGNOSIS — N2581 Secondary hyperparathyroidism of renal origin: Secondary | ICD-10-CM | POA: Diagnosis not present

## 2022-10-31 DIAGNOSIS — E876 Hypokalemia: Secondary | ICD-10-CM | POA: Diagnosis not present

## 2022-10-31 DIAGNOSIS — E611 Iron deficiency: Secondary | ICD-10-CM | POA: Diagnosis not present

## 2022-10-31 DIAGNOSIS — D631 Anemia in chronic kidney disease: Secondary | ICD-10-CM | POA: Diagnosis not present

## 2022-11-02 DIAGNOSIS — E876 Hypokalemia: Secondary | ICD-10-CM | POA: Diagnosis not present

## 2022-11-02 DIAGNOSIS — D631 Anemia in chronic kidney disease: Secondary | ICD-10-CM | POA: Diagnosis not present

## 2022-11-02 DIAGNOSIS — N186 End stage renal disease: Secondary | ICD-10-CM | POA: Diagnosis not present

## 2022-11-02 DIAGNOSIS — E611 Iron deficiency: Secondary | ICD-10-CM | POA: Diagnosis not present

## 2022-11-02 DIAGNOSIS — N2581 Secondary hyperparathyroidism of renal origin: Secondary | ICD-10-CM | POA: Diagnosis not present

## 2022-11-02 DIAGNOSIS — Z992 Dependence on renal dialysis: Secondary | ICD-10-CM | POA: Diagnosis not present

## 2022-11-05 DIAGNOSIS — D631 Anemia in chronic kidney disease: Secondary | ICD-10-CM | POA: Diagnosis not present

## 2022-11-05 DIAGNOSIS — E876 Hypokalemia: Secondary | ICD-10-CM | POA: Diagnosis not present

## 2022-11-05 DIAGNOSIS — N2581 Secondary hyperparathyroidism of renal origin: Secondary | ICD-10-CM | POA: Diagnosis not present

## 2022-11-05 DIAGNOSIS — N186 End stage renal disease: Secondary | ICD-10-CM | POA: Diagnosis not present

## 2022-11-05 DIAGNOSIS — E611 Iron deficiency: Secondary | ICD-10-CM | POA: Diagnosis not present

## 2022-11-05 DIAGNOSIS — Z992 Dependence on renal dialysis: Secondary | ICD-10-CM | POA: Diagnosis not present

## 2022-11-07 DIAGNOSIS — N2581 Secondary hyperparathyroidism of renal origin: Secondary | ICD-10-CM | POA: Diagnosis not present

## 2022-11-07 DIAGNOSIS — N186 End stage renal disease: Secondary | ICD-10-CM | POA: Diagnosis not present

## 2022-11-07 DIAGNOSIS — D631 Anemia in chronic kidney disease: Secondary | ICD-10-CM | POA: Diagnosis not present

## 2022-11-07 DIAGNOSIS — E876 Hypokalemia: Secondary | ICD-10-CM | POA: Diagnosis not present

## 2022-11-07 DIAGNOSIS — Z992 Dependence on renal dialysis: Secondary | ICD-10-CM | POA: Diagnosis not present

## 2022-11-07 DIAGNOSIS — E611 Iron deficiency: Secondary | ICD-10-CM | POA: Diagnosis not present

## 2022-11-09 DIAGNOSIS — N186 End stage renal disease: Secondary | ICD-10-CM | POA: Diagnosis not present

## 2022-11-09 DIAGNOSIS — E876 Hypokalemia: Secondary | ICD-10-CM | POA: Diagnosis not present

## 2022-11-09 DIAGNOSIS — N2581 Secondary hyperparathyroidism of renal origin: Secondary | ICD-10-CM | POA: Diagnosis not present

## 2022-11-09 DIAGNOSIS — Z992 Dependence on renal dialysis: Secondary | ICD-10-CM | POA: Diagnosis not present

## 2022-11-09 DIAGNOSIS — D631 Anemia in chronic kidney disease: Secondary | ICD-10-CM | POA: Diagnosis not present

## 2022-11-09 DIAGNOSIS — E611 Iron deficiency: Secondary | ICD-10-CM | POA: Diagnosis not present

## 2022-11-11 DIAGNOSIS — N186 End stage renal disease: Secondary | ICD-10-CM | POA: Diagnosis not present

## 2022-11-12 DIAGNOSIS — D631 Anemia in chronic kidney disease: Secondary | ICD-10-CM | POA: Diagnosis not present

## 2022-11-12 DIAGNOSIS — Z992 Dependence on renal dialysis: Secondary | ICD-10-CM | POA: Diagnosis not present

## 2022-11-12 DIAGNOSIS — E876 Hypokalemia: Secondary | ICD-10-CM | POA: Diagnosis not present

## 2022-11-12 DIAGNOSIS — E611 Iron deficiency: Secondary | ICD-10-CM | POA: Diagnosis not present

## 2022-11-12 DIAGNOSIS — N186 End stage renal disease: Secondary | ICD-10-CM | POA: Diagnosis not present

## 2022-11-12 DIAGNOSIS — N2581 Secondary hyperparathyroidism of renal origin: Secondary | ICD-10-CM | POA: Diagnosis not present

## 2022-11-14 DIAGNOSIS — N2581 Secondary hyperparathyroidism of renal origin: Secondary | ICD-10-CM | POA: Diagnosis not present

## 2022-11-14 DIAGNOSIS — D631 Anemia in chronic kidney disease: Secondary | ICD-10-CM | POA: Diagnosis not present

## 2022-11-14 DIAGNOSIS — E611 Iron deficiency: Secondary | ICD-10-CM | POA: Diagnosis not present

## 2022-11-14 DIAGNOSIS — N186 End stage renal disease: Secondary | ICD-10-CM | POA: Diagnosis not present

## 2022-11-14 DIAGNOSIS — E876 Hypokalemia: Secondary | ICD-10-CM | POA: Diagnosis not present

## 2022-11-14 DIAGNOSIS — Z992 Dependence on renal dialysis: Secondary | ICD-10-CM | POA: Diagnosis not present

## 2022-11-16 DIAGNOSIS — N186 End stage renal disease: Secondary | ICD-10-CM | POA: Diagnosis not present

## 2022-11-16 DIAGNOSIS — D631 Anemia in chronic kidney disease: Secondary | ICD-10-CM | POA: Diagnosis not present

## 2022-11-16 DIAGNOSIS — N2581 Secondary hyperparathyroidism of renal origin: Secondary | ICD-10-CM | POA: Diagnosis not present

## 2022-11-16 DIAGNOSIS — E876 Hypokalemia: Secondary | ICD-10-CM | POA: Diagnosis not present

## 2022-11-16 DIAGNOSIS — Z992 Dependence on renal dialysis: Secondary | ICD-10-CM | POA: Diagnosis not present

## 2022-11-16 DIAGNOSIS — E611 Iron deficiency: Secondary | ICD-10-CM | POA: Diagnosis not present

## 2022-11-19 DIAGNOSIS — E876 Hypokalemia: Secondary | ICD-10-CM | POA: Diagnosis not present

## 2022-11-19 DIAGNOSIS — N186 End stage renal disease: Secondary | ICD-10-CM | POA: Diagnosis not present

## 2022-11-19 DIAGNOSIS — Z992 Dependence on renal dialysis: Secondary | ICD-10-CM | POA: Diagnosis not present

## 2022-11-19 DIAGNOSIS — E611 Iron deficiency: Secondary | ICD-10-CM | POA: Diagnosis not present

## 2022-11-19 DIAGNOSIS — D631 Anemia in chronic kidney disease: Secondary | ICD-10-CM | POA: Diagnosis not present

## 2022-11-19 DIAGNOSIS — N2581 Secondary hyperparathyroidism of renal origin: Secondary | ICD-10-CM | POA: Diagnosis not present

## 2022-11-20 ENCOUNTER — Ambulatory Visit (INDEPENDENT_AMBULATORY_CARE_PROVIDER_SITE_OTHER): Payer: 59

## 2022-11-20 VITALS — BP 125/62 | HR 95 | Temp 97.9°F | Ht 62.0 in | Wt 82.2 lb

## 2022-11-20 DIAGNOSIS — E05 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: Secondary | ICD-10-CM

## 2022-11-20 DIAGNOSIS — I10 Essential (primary) hypertension: Secondary | ICD-10-CM

## 2022-11-20 DIAGNOSIS — M810 Age-related osteoporosis without current pathological fracture: Secondary | ICD-10-CM | POA: Diagnosis not present

## 2022-11-20 DIAGNOSIS — E119 Type 2 diabetes mellitus without complications: Secondary | ICD-10-CM | POA: Diagnosis not present

## 2022-11-20 DIAGNOSIS — E118 Type 2 diabetes mellitus with unspecified complications: Secondary | ICD-10-CM

## 2022-11-20 DIAGNOSIS — Z794 Long term (current) use of insulin: Secondary | ICD-10-CM

## 2022-11-20 DIAGNOSIS — Z7984 Long term (current) use of oral hypoglycemic drugs: Secondary | ICD-10-CM

## 2022-11-20 DIAGNOSIS — Z0000 Encounter for general adult medical examination without abnormal findings: Secondary | ICD-10-CM

## 2022-11-20 LAB — GLUCOSE, CAPILLARY: Glucose-Capillary: 181 mg/dL — ABNORMAL HIGH (ref 70–99)

## 2022-11-20 LAB — POCT GLYCOSYLATED HEMOGLOBIN (HGB A1C): Hemoglobin A1C: 9 % — AB (ref 4.0–5.6)

## 2022-11-20 NOTE — Assessment & Plan Note (Signed)
BP well controlled at 125/62 today. -Continue verapamil 360 mg daily -Continue clonidine patch 0.2 mg weekly -Continue hydralazine 50 mg twice daily

## 2022-11-20 NOTE — Progress Notes (Incomplete)
HTN -Continue verapamil 360 mg daily -Continue clonidine patch 0.2 mg weekly -Continue hydralazine 50 mg twice daily  Bilateral carotid stenosis 1-39%HLD Atorva '80mg'$   Graves TSH wnl 12/2020  T2DM A1c 7.5 02/2022 -Increase NovoLog to 13 units with breakfast, and 10 units with lunch and dinner. -Continue Lantus 30 units BID  -A1c in 3 months  -Lifestyle modifications discussed  -Follow-up in 2 weeks with glucometer  -Emphasized the importance of consistent CBG monitoring, including AM fasting   Osteoporosis DXA 2018 T -3.1 R fem neck Repeat DEXA Fosamax complete? Vitamin D and calcium?  ESRDACKD dialysis   HCM A1c  Foot exam Mammo Colon cancer screen Lipid panel

## 2022-11-20 NOTE — Assessment & Plan Note (Signed)
Patient last visited the clinic 02/2022 and since A1c has increased from 7.5 to 9.0. She reports taking novolog 5u AM and is prescribed 13u. Says she decreased based on her numbers on her glucometer. Otherwise taking novolog 10 w lunch and dinner, lantus 30 BID as prescribed. Not a candidate for metformin or SGLT2 based on GFR. BMI 15 so not a good candidate for GLP-1. Did not bring her meter to clinic. Will come back in 2 weeks with meter for insulin titration.

## 2022-11-20 NOTE — Patient Instructions (Signed)
Thank you, Ms.[name] for allowing [place] to provide your care today. Today we discussed :  Diabetes: Your A1c went up to 9%. Our goal is less than 7%. Continue checking your sugars. I want you to come back in 2 weeks and please bring your meter so that we can change your insulin safely.  I have ordered the following labs for you:   Lab Orders         Glucose, capillary         POC Hbg A1C        Follow up:  2 weeks      We look forward to seeing you next time. Please call our clinic at [phone] if you have any questions or concerns. The best time to call is Monday-Friday from 9am-4pm, but there is someone available 24/7. If after hours or the weekend, call the main hospital number and ask for the Internal Medicine Resident On-Call. If you need medication refills, please notify your pharmacy one week in advance and they will send [place] a request.   Thank you for trusting me with your care. Wishing you the best!   Iona Coach, MD [place]

## 2022-11-21 DIAGNOSIS — D631 Anemia in chronic kidney disease: Secondary | ICD-10-CM | POA: Diagnosis not present

## 2022-11-21 DIAGNOSIS — N186 End stage renal disease: Secondary | ICD-10-CM | POA: Diagnosis not present

## 2022-11-21 DIAGNOSIS — E611 Iron deficiency: Secondary | ICD-10-CM | POA: Diagnosis not present

## 2022-11-21 DIAGNOSIS — N2581 Secondary hyperparathyroidism of renal origin: Secondary | ICD-10-CM | POA: Diagnosis not present

## 2022-11-21 DIAGNOSIS — E876 Hypokalemia: Secondary | ICD-10-CM | POA: Diagnosis not present

## 2022-11-21 DIAGNOSIS — I129 Hypertensive chronic kidney disease with stage 1 through stage 4 chronic kidney disease, or unspecified chronic kidney disease: Secondary | ICD-10-CM | POA: Diagnosis not present

## 2022-11-21 DIAGNOSIS — Z992 Dependence on renal dialysis: Secondary | ICD-10-CM | POA: Diagnosis not present

## 2022-11-21 NOTE — Assessment & Plan Note (Addendum)
Consider FOB for colon cancer screening at next visit. Discuss mammograms with the patient. Shared decision making on checking lipid panel at next visit, already on Atorvastatin '80mg'$  daily.

## 2022-11-21 NOTE — Progress Notes (Signed)
Internal Medicine Clinic Attending  Case discussed with Dr. Stann Mainland  At the time of the visit.  We reviewed the resident's history and exam and pertinent patient test results.  I agree with the assessment, diagnosis, and plan of care documented in the resident's note.

## 2022-11-21 NOTE — Assessment & Plan Note (Signed)
Patient to see [facility] endocrinology 2/29. Says they check her TSH yearly, please ensure this is done. Appears she has been in remission from her graves for many years following methimazole treatment.

## 2022-11-21 NOTE — Assessment & Plan Note (Signed)
Patient due for repeat DEXA scan. Says she would not do the therapy if the result showed persistent osteoporosis. Also limited in therapy given her ESRD. Additionally it is likely that her osteoporosis is secondary to CKD. Wishes to continue the vitamin D and calcium supplements managed by her nephrologist at this time.

## 2022-11-21 NOTE — Progress Notes (Signed)
Established Patient Office Visit  Subjective   Patient ID: AYLA LIPKO, female    DOB: Jan 12, 1956  Age: 67 y.o. MRN: [id number]  Chief Complaint  Patient presents with   Diabetes   Follow-up    Ms. Aakre is a 67 y/o female with a pmh outlined below. Please see A&P for HPI information.  Diabetes      Review of Systems  All other systems reviewed and are negative.     Objective:     BP 125/62 (BP Location: Left Arm, Patient Position: Sitting, Cuff Size: Small)   Pulse 95   Temp 97.9 F (36.6 C) (Oral)   Ht '5\' 2"'$  (1.575 m)   Wt 82 lb 3.2 oz (37.3 kg)   SpO2 100%   BMI 15.03 kg/m    Physical Exam Constitutional:      Appearance: Normal appearance.     Comments: Frail, sarcopenic  Eyes:     General: No scleral icterus.    Conjunctiva/sclera: Conjunctivae normal.  Cardiovascular:     Rate and Rhythm: Normal rate and regular rhythm.     Pulses: Normal pulses.     Heart sounds: Normal heart sounds. No murmur heard.    No gallop.  Pulmonary:     Effort: Pulmonary effort is normal. No respiratory distress.     Breath sounds: Normal breath sounds. No wheezing or rales.  Musculoskeletal:     Right lower leg: No edema.     Left lower leg: No edema.  Skin:    General: Skin is warm and dry.     Capillary Refill: Capillary refill takes less than 2 seconds.  Neurological:     Mental Status: She is alert.     Sensory: No sensory deficit.      Results for orders placed or performed in visit on 11/20/22  Glucose, capillary  Result Value Ref Range   Glucose-Capillary 181 (H) 70 - 99 mg/dL  POC Hbg A1C  Result Value Ref Range   Hemoglobin A1C 9.0 (A) 4.0 - 5.6 %   HbA1c POC (<> result, manual entry)     HbA1c, POC (prediabetic range)     HbA1c, POC (controlled diabetic range)        The 10-year ASCVD risk score (Arnett DK, et al., 2019) is: 34.3%    Assessment & Plan:   Problem List Items Addressed This Visit       Cardiovascular and  Mediastinum   Essential hypertension (Chronic)    BP well controlled at 125/62 today. -Continue verapamil 360 mg daily -Continue clonidine patch 0.2 mg weekly -Continue hydralazine 50 mg twice daily        Endocrine   Type 2 diabetes mellitus with complication (HCC) (Chronic)    Patient last visited the clinic 02/2022 and since A1c has increased from 7.5 to 9.0. She reports taking novolog 5u AM and is prescribed 13u. Says she decreased based on her numbers on her glucometer. Otherwise taking novolog 10 w lunch and dinner, lantus 30 BID as prescribed. Not a candidate for metformin or SGLT2 based on GFR. BMI 15 so not a good candidate for GLP-1. Did not bring her meter to clinic. Will come back in 2 weeks with meter for insulin titration.      Graves disease (Chronic)    Patient to see [facility] endocrinology 2/29. Says they check her TSH yearly, please ensure this is done. Appears she has been in remission from her graves for many years following methimazole treatment.  Musculoskeletal and Integument   Osteoporosis    Patient due for repeat DEXA scan. Says she would not do the therapy if the result showed persistent osteoporosis. Also limited in therapy given her ESRD. Additionally it is likely that her osteoporosis is secondary to CKD. Wishes to continue the vitamin D and calcium supplements managed by her nephrologist at this time.        Other   Health care maintenance    Consider FOB for colon cancer screening at next visit. Discuss mammograms with the patient. Shared decision making on checking lipid panel at next visit, already on Atorvastatin '80mg'$  daily.      Other Visit Diagnoses     Type 2 diabetes mellitus without complication, with long-term current use of insulin ([place])    -  Primary   Relevant Orders   POC Hbg A1C (Completed)       Return in about 2 weeks (around 12/04/2022).    Iona Coach, MD

## 2022-11-23 DIAGNOSIS — N186 End stage renal disease: Secondary | ICD-10-CM | POA: Diagnosis not present

## 2022-11-23 DIAGNOSIS — E611 Iron deficiency: Secondary | ICD-10-CM | POA: Diagnosis not present

## 2022-11-23 DIAGNOSIS — N2581 Secondary hyperparathyroidism of renal origin: Secondary | ICD-10-CM | POA: Diagnosis not present

## 2022-11-23 DIAGNOSIS — Z992 Dependence on renal dialysis: Secondary | ICD-10-CM | POA: Diagnosis not present

## 2022-11-23 DIAGNOSIS — D631 Anemia in chronic kidney disease: Secondary | ICD-10-CM | POA: Diagnosis not present

## 2022-11-23 DIAGNOSIS — E876 Hypokalemia: Secondary | ICD-10-CM | POA: Diagnosis not present

## 2022-11-23 DIAGNOSIS — L299 Pruritus, unspecified: Secondary | ICD-10-CM | POA: Diagnosis not present

## 2022-11-26 DIAGNOSIS — Z992 Dependence on renal dialysis: Secondary | ICD-10-CM | POA: Diagnosis not present

## 2022-11-26 DIAGNOSIS — E876 Hypokalemia: Secondary | ICD-10-CM | POA: Diagnosis not present

## 2022-11-26 DIAGNOSIS — D631 Anemia in chronic kidney disease: Secondary | ICD-10-CM | POA: Diagnosis not present

## 2022-11-26 DIAGNOSIS — N186 End stage renal disease: Secondary | ICD-10-CM | POA: Diagnosis not present

## 2022-11-26 DIAGNOSIS — N2581 Secondary hyperparathyroidism of renal origin: Secondary | ICD-10-CM | POA: Diagnosis not present

## 2022-11-26 DIAGNOSIS — L299 Pruritus, unspecified: Secondary | ICD-10-CM | POA: Diagnosis not present

## 2022-11-26 DIAGNOSIS — E611 Iron deficiency: Secondary | ICD-10-CM | POA: Diagnosis not present

## 2022-11-28 ENCOUNTER — Other Ambulatory Visit: Payer: Self-pay | Admitting: Internal Medicine

## 2022-11-28 DIAGNOSIS — D631 Anemia in chronic kidney disease: Secondary | ICD-10-CM | POA: Diagnosis not present

## 2022-11-28 DIAGNOSIS — L299 Pruritus, unspecified: Secondary | ICD-10-CM | POA: Diagnosis not present

## 2022-11-28 DIAGNOSIS — E611 Iron deficiency: Secondary | ICD-10-CM | POA: Diagnosis not present

## 2022-11-28 DIAGNOSIS — Z992 Dependence on renal dialysis: Secondary | ICD-10-CM | POA: Diagnosis not present

## 2022-11-28 DIAGNOSIS — B9689 Other specified bacterial agents as the cause of diseases classified elsewhere: Secondary | ICD-10-CM

## 2022-11-28 DIAGNOSIS — E876 Hypokalemia: Secondary | ICD-10-CM | POA: Diagnosis not present

## 2022-11-28 DIAGNOSIS — N186 End stage renal disease: Secondary | ICD-10-CM | POA: Diagnosis not present

## 2022-11-28 DIAGNOSIS — N2581 Secondary hyperparathyroidism of renal origin: Secondary | ICD-10-CM | POA: Diagnosis not present

## 2022-11-30 DIAGNOSIS — E611 Iron deficiency: Secondary | ICD-10-CM | POA: Diagnosis not present

## 2022-11-30 DIAGNOSIS — L299 Pruritus, unspecified: Secondary | ICD-10-CM | POA: Diagnosis not present

## 2022-11-30 DIAGNOSIS — N2581 Secondary hyperparathyroidism of renal origin: Secondary | ICD-10-CM | POA: Diagnosis not present

## 2022-11-30 DIAGNOSIS — D631 Anemia in chronic kidney disease: Secondary | ICD-10-CM | POA: Diagnosis not present

## 2022-11-30 DIAGNOSIS — Z992 Dependence on renal dialysis: Secondary | ICD-10-CM | POA: Diagnosis not present

## 2022-11-30 DIAGNOSIS — E876 Hypokalemia: Secondary | ICD-10-CM | POA: Diagnosis not present

## 2022-11-30 DIAGNOSIS — N186 End stage renal disease: Secondary | ICD-10-CM | POA: Diagnosis not present

## 2022-12-01 ENCOUNTER — Other Ambulatory Visit: Payer: Self-pay

## 2022-12-01 DIAGNOSIS — I10 Essential (primary) hypertension: Secondary | ICD-10-CM

## 2022-12-02 ENCOUNTER — Telehealth: Payer: Self-pay

## 2022-12-02 NOTE — Telephone Encounter (Signed)
Prior Authorization for patient (Fluticasone) came through on cover my meds was submitted with last office notes awaiting approval or denial

## 2022-12-02 NOTE — Telephone Encounter (Signed)
I will need a prior authorization request from the pharmacy in order for me to submit.

## 2022-12-03 DIAGNOSIS — E876 Hypokalemia: Secondary | ICD-10-CM | POA: Diagnosis not present

## 2022-12-03 DIAGNOSIS — E611 Iron deficiency: Secondary | ICD-10-CM | POA: Diagnosis not present

## 2022-12-03 DIAGNOSIS — Z992 Dependence on renal dialysis: Secondary | ICD-10-CM | POA: Diagnosis not present

## 2022-12-03 DIAGNOSIS — D631 Anemia in chronic kidney disease: Secondary | ICD-10-CM | POA: Diagnosis not present

## 2022-12-03 DIAGNOSIS — L299 Pruritus, unspecified: Secondary | ICD-10-CM | POA: Diagnosis not present

## 2022-12-03 DIAGNOSIS — N186 End stage renal disease: Secondary | ICD-10-CM | POA: Diagnosis not present

## 2022-12-03 DIAGNOSIS — N2581 Secondary hyperparathyroidism of renal origin: Secondary | ICD-10-CM | POA: Diagnosis not present

## 2022-12-03 NOTE — Telephone Encounter (Signed)
Decision:Denied Medicare allows [place] to cover a drug only when it is a Part D drug. A Part D drug is one that is used for a "medically accepted indication." A medically accepted indication means the use is approved by the Food and Drug Administration (FDA) OR the use is supported by one of the following accepted references: (1) [place]. (2) Micromedex [place]. FLUTICASONE HFA AER 44MCG is not FDA approved for your medical condition(s): Bacterial sinusitis; Chronic sinusitis, unspecified; Other specified bacterial agents as the cause of diseases classified elsewhere. These condition(s) are not supported by one of the accepted references. Therefore, your drug is denied because it is not being used for a "medically accepted indication."

## 2022-12-04 ENCOUNTER — Encounter: Payer: 59 | Admitting: Student

## 2022-12-04 DIAGNOSIS — Z992 Dependence on renal dialysis: Secondary | ICD-10-CM | POA: Diagnosis not present

## 2022-12-04 DIAGNOSIS — N2581 Secondary hyperparathyroidism of renal origin: Secondary | ICD-10-CM | POA: Diagnosis not present

## 2022-12-04 DIAGNOSIS — E8770 Fluid overload, unspecified: Secondary | ICD-10-CM | POA: Diagnosis not present

## 2022-12-04 DIAGNOSIS — N186 End stage renal disease: Secondary | ICD-10-CM | POA: Diagnosis not present

## 2022-12-05 DIAGNOSIS — N2581 Secondary hyperparathyroidism of renal origin: Secondary | ICD-10-CM | POA: Diagnosis not present

## 2022-12-05 DIAGNOSIS — Z992 Dependence on renal dialysis: Secondary | ICD-10-CM | POA: Diagnosis not present

## 2022-12-05 DIAGNOSIS — E611 Iron deficiency: Secondary | ICD-10-CM | POA: Diagnosis not present

## 2022-12-05 DIAGNOSIS — D631 Anemia in chronic kidney disease: Secondary | ICD-10-CM | POA: Diagnosis not present

## 2022-12-05 DIAGNOSIS — N186 End stage renal disease: Secondary | ICD-10-CM | POA: Diagnosis not present

## 2022-12-05 DIAGNOSIS — L299 Pruritus, unspecified: Secondary | ICD-10-CM | POA: Diagnosis not present

## 2022-12-05 DIAGNOSIS — E876 Hypokalemia: Secondary | ICD-10-CM | POA: Diagnosis not present

## 2022-12-07 DIAGNOSIS — E876 Hypokalemia: Secondary | ICD-10-CM | POA: Diagnosis not present

## 2022-12-07 DIAGNOSIS — Z992 Dependence on renal dialysis: Secondary | ICD-10-CM | POA: Diagnosis not present

## 2022-12-07 DIAGNOSIS — L299 Pruritus, unspecified: Secondary | ICD-10-CM | POA: Diagnosis not present

## 2022-12-07 DIAGNOSIS — E611 Iron deficiency: Secondary | ICD-10-CM | POA: Diagnosis not present

## 2022-12-07 DIAGNOSIS — N2581 Secondary hyperparathyroidism of renal origin: Secondary | ICD-10-CM | POA: Diagnosis not present

## 2022-12-07 DIAGNOSIS — D631 Anemia in chronic kidney disease: Secondary | ICD-10-CM | POA: Diagnosis not present

## 2022-12-07 DIAGNOSIS — N186 End stage renal disease: Secondary | ICD-10-CM | POA: Diagnosis not present

## 2022-12-10 DIAGNOSIS — N186 End stage renal disease: Secondary | ICD-10-CM | POA: Diagnosis not present

## 2022-12-10 DIAGNOSIS — N2581 Secondary hyperparathyroidism of renal origin: Secondary | ICD-10-CM | POA: Diagnosis not present

## 2022-12-10 DIAGNOSIS — E876 Hypokalemia: Secondary | ICD-10-CM | POA: Diagnosis not present

## 2022-12-10 DIAGNOSIS — E611 Iron deficiency: Secondary | ICD-10-CM | POA: Diagnosis not present

## 2022-12-10 DIAGNOSIS — L299 Pruritus, unspecified: Secondary | ICD-10-CM | POA: Diagnosis not present

## 2022-12-10 DIAGNOSIS — Z992 Dependence on renal dialysis: Secondary | ICD-10-CM | POA: Diagnosis not present

## 2022-12-10 DIAGNOSIS — D631 Anemia in chronic kidney disease: Secondary | ICD-10-CM | POA: Diagnosis not present

## 2022-12-12 DIAGNOSIS — N2581 Secondary hyperparathyroidism of renal origin: Secondary | ICD-10-CM | POA: Diagnosis not present

## 2022-12-12 DIAGNOSIS — N186 End stage renal disease: Secondary | ICD-10-CM | POA: Diagnosis not present

## 2022-12-12 DIAGNOSIS — L299 Pruritus, unspecified: Secondary | ICD-10-CM | POA: Diagnosis not present

## 2022-12-12 DIAGNOSIS — D631 Anemia in chronic kidney disease: Secondary | ICD-10-CM | POA: Diagnosis not present

## 2022-12-12 DIAGNOSIS — E876 Hypokalemia: Secondary | ICD-10-CM | POA: Diagnosis not present

## 2022-12-12 DIAGNOSIS — E611 Iron deficiency: Secondary | ICD-10-CM | POA: Diagnosis not present

## 2022-12-12 DIAGNOSIS — Z992 Dependence on renal dialysis: Secondary | ICD-10-CM | POA: Diagnosis not present

## 2022-12-14 DIAGNOSIS — N2581 Secondary hyperparathyroidism of renal origin: Secondary | ICD-10-CM | POA: Diagnosis not present

## 2022-12-14 DIAGNOSIS — E611 Iron deficiency: Secondary | ICD-10-CM | POA: Diagnosis not present

## 2022-12-14 DIAGNOSIS — Z992 Dependence on renal dialysis: Secondary | ICD-10-CM | POA: Diagnosis not present

## 2022-12-14 DIAGNOSIS — D631 Anemia in chronic kidney disease: Secondary | ICD-10-CM | POA: Diagnosis not present

## 2022-12-14 DIAGNOSIS — E876 Hypokalemia: Secondary | ICD-10-CM | POA: Diagnosis not present

## 2022-12-14 DIAGNOSIS — L299 Pruritus, unspecified: Secondary | ICD-10-CM | POA: Diagnosis not present

## 2022-12-14 DIAGNOSIS — N186 End stage renal disease: Secondary | ICD-10-CM | POA: Diagnosis not present

## 2022-12-17 DIAGNOSIS — E611 Iron deficiency: Secondary | ICD-10-CM | POA: Diagnosis not present

## 2022-12-17 DIAGNOSIS — Z992 Dependence on renal dialysis: Secondary | ICD-10-CM | POA: Diagnosis not present

## 2022-12-17 DIAGNOSIS — N2581 Secondary hyperparathyroidism of renal origin: Secondary | ICD-10-CM | POA: Diagnosis not present

## 2022-12-17 DIAGNOSIS — N186 End stage renal disease: Secondary | ICD-10-CM | POA: Diagnosis not present

## 2022-12-17 DIAGNOSIS — D631 Anemia in chronic kidney disease: Secondary | ICD-10-CM | POA: Diagnosis not present

## 2022-12-17 DIAGNOSIS — E876 Hypokalemia: Secondary | ICD-10-CM | POA: Diagnosis not present

## 2022-12-17 DIAGNOSIS — L299 Pruritus, unspecified: Secondary | ICD-10-CM | POA: Diagnosis not present

## 2022-12-19 DIAGNOSIS — N2581 Secondary hyperparathyroidism of renal origin: Secondary | ICD-10-CM | POA: Diagnosis not present

## 2022-12-19 DIAGNOSIS — E876 Hypokalemia: Secondary | ICD-10-CM | POA: Diagnosis not present

## 2022-12-19 DIAGNOSIS — Z992 Dependence on renal dialysis: Secondary | ICD-10-CM | POA: Diagnosis not present

## 2022-12-19 DIAGNOSIS — E611 Iron deficiency: Secondary | ICD-10-CM | POA: Diagnosis not present

## 2022-12-19 DIAGNOSIS — N186 End stage renal disease: Secondary | ICD-10-CM | POA: Diagnosis not present

## 2022-12-19 DIAGNOSIS — D631 Anemia in chronic kidney disease: Secondary | ICD-10-CM | POA: Diagnosis not present

## 2022-12-19 DIAGNOSIS — L299 Pruritus, unspecified: Secondary | ICD-10-CM | POA: Diagnosis not present

## 2022-12-21 DIAGNOSIS — Z992 Dependence on renal dialysis: Secondary | ICD-10-CM | POA: Diagnosis not present

## 2022-12-21 DIAGNOSIS — N186 End stage renal disease: Secondary | ICD-10-CM | POA: Diagnosis not present

## 2022-12-21 DIAGNOSIS — E876 Hypokalemia: Secondary | ICD-10-CM | POA: Diagnosis not present

## 2022-12-21 DIAGNOSIS — E611 Iron deficiency: Secondary | ICD-10-CM | POA: Diagnosis not present

## 2022-12-21 DIAGNOSIS — L299 Pruritus, unspecified: Secondary | ICD-10-CM | POA: Diagnosis not present

## 2022-12-21 DIAGNOSIS — N2581 Secondary hyperparathyroidism of renal origin: Secondary | ICD-10-CM | POA: Diagnosis not present

## 2022-12-21 DIAGNOSIS — D631 Anemia in chronic kidney disease: Secondary | ICD-10-CM | POA: Diagnosis not present

## 2022-12-22 DIAGNOSIS — N186 End stage renal disease: Secondary | ICD-10-CM | POA: Diagnosis not present

## 2022-12-22 DIAGNOSIS — Z992 Dependence on renal dialysis: Secondary | ICD-10-CM | POA: Diagnosis not present

## 2022-12-22 DIAGNOSIS — I129 Hypertensive chronic kidney disease with stage 1 through stage 4 chronic kidney disease, or unspecified chronic kidney disease: Secondary | ICD-10-CM | POA: Diagnosis not present

## 2022-12-24 DIAGNOSIS — N186 End stage renal disease: Secondary | ICD-10-CM | POA: Diagnosis not present

## 2022-12-24 DIAGNOSIS — E611 Iron deficiency: Secondary | ICD-10-CM | POA: Diagnosis not present

## 2022-12-24 DIAGNOSIS — Z992 Dependence on renal dialysis: Secondary | ICD-10-CM | POA: Diagnosis not present

## 2022-12-24 DIAGNOSIS — N2581 Secondary hyperparathyroidism of renal origin: Secondary | ICD-10-CM | POA: Diagnosis not present

## 2022-12-24 DIAGNOSIS — E876 Hypokalemia: Secondary | ICD-10-CM | POA: Diagnosis not present

## 2022-12-24 DIAGNOSIS — E1129 Type 2 diabetes mellitus with other diabetic kidney complication: Secondary | ICD-10-CM | POA: Diagnosis not present

## 2022-12-24 DIAGNOSIS — D631 Anemia in chronic kidney disease: Secondary | ICD-10-CM | POA: Diagnosis not present

## 2022-12-25 ENCOUNTER — Other Ambulatory Visit: Payer: Self-pay

## 2022-12-25 DIAGNOSIS — I10 Essential (primary) hypertension: Secondary | ICD-10-CM

## 2022-12-26 DIAGNOSIS — N2581 Secondary hyperparathyroidism of renal origin: Secondary | ICD-10-CM | POA: Diagnosis not present

## 2022-12-26 DIAGNOSIS — E1129 Type 2 diabetes mellitus with other diabetic kidney complication: Secondary | ICD-10-CM | POA: Diagnosis not present

## 2022-12-26 DIAGNOSIS — E611 Iron deficiency: Secondary | ICD-10-CM | POA: Diagnosis not present

## 2022-12-26 DIAGNOSIS — Z992 Dependence on renal dialysis: Secondary | ICD-10-CM | POA: Diagnosis not present

## 2022-12-26 DIAGNOSIS — N186 End stage renal disease: Secondary | ICD-10-CM | POA: Diagnosis not present

## 2022-12-26 DIAGNOSIS — D631 Anemia in chronic kidney disease: Secondary | ICD-10-CM | POA: Diagnosis not present

## 2022-12-26 DIAGNOSIS — E876 Hypokalemia: Secondary | ICD-10-CM | POA: Diagnosis not present

## 2022-12-28 DIAGNOSIS — D631 Anemia in chronic kidney disease: Secondary | ICD-10-CM | POA: Diagnosis not present

## 2022-12-28 DIAGNOSIS — Z992 Dependence on renal dialysis: Secondary | ICD-10-CM | POA: Diagnosis not present

## 2022-12-28 DIAGNOSIS — E611 Iron deficiency: Secondary | ICD-10-CM | POA: Diagnosis not present

## 2022-12-28 DIAGNOSIS — N2581 Secondary hyperparathyroidism of renal origin: Secondary | ICD-10-CM | POA: Diagnosis not present

## 2022-12-28 DIAGNOSIS — E876 Hypokalemia: Secondary | ICD-10-CM | POA: Diagnosis not present

## 2022-12-28 DIAGNOSIS — E1129 Type 2 diabetes mellitus with other diabetic kidney complication: Secondary | ICD-10-CM | POA: Diagnosis not present

## 2022-12-28 DIAGNOSIS — N186 End stage renal disease: Secondary | ICD-10-CM | POA: Diagnosis not present

## 2022-12-29 ENCOUNTER — Other Ambulatory Visit: Payer: Self-pay | Admitting: Internal Medicine

## 2022-12-29 DIAGNOSIS — H34211 Partial retinal artery occlusion, right eye: Secondary | ICD-10-CM

## 2022-12-31 DIAGNOSIS — E876 Hypokalemia: Secondary | ICD-10-CM | POA: Diagnosis not present

## 2022-12-31 DIAGNOSIS — N186 End stage renal disease: Secondary | ICD-10-CM | POA: Diagnosis not present

## 2022-12-31 DIAGNOSIS — Z992 Dependence on renal dialysis: Secondary | ICD-10-CM | POA: Diagnosis not present

## 2022-12-31 DIAGNOSIS — D631 Anemia in chronic kidney disease: Secondary | ICD-10-CM | POA: Diagnosis not present

## 2022-12-31 DIAGNOSIS — E1129 Type 2 diabetes mellitus with other diabetic kidney complication: Secondary | ICD-10-CM | POA: Diagnosis not present

## 2022-12-31 DIAGNOSIS — N2581 Secondary hyperparathyroidism of renal origin: Secondary | ICD-10-CM | POA: Diagnosis not present

## 2022-12-31 DIAGNOSIS — E611 Iron deficiency: Secondary | ICD-10-CM | POA: Diagnosis not present

## 2023-01-02 DIAGNOSIS — E611 Iron deficiency: Secondary | ICD-10-CM | POA: Diagnosis not present

## 2023-01-02 DIAGNOSIS — D631 Anemia in chronic kidney disease: Secondary | ICD-10-CM | POA: Diagnosis not present

## 2023-01-02 DIAGNOSIS — N186 End stage renal disease: Secondary | ICD-10-CM | POA: Diagnosis not present

## 2023-01-02 DIAGNOSIS — E876 Hypokalemia: Secondary | ICD-10-CM | POA: Diagnosis not present

## 2023-01-02 DIAGNOSIS — N2581 Secondary hyperparathyroidism of renal origin: Secondary | ICD-10-CM | POA: Diagnosis not present

## 2023-01-02 DIAGNOSIS — Z992 Dependence on renal dialysis: Secondary | ICD-10-CM | POA: Diagnosis not present

## 2023-01-02 DIAGNOSIS — E1129 Type 2 diabetes mellitus with other diabetic kidney complication: Secondary | ICD-10-CM | POA: Diagnosis not present

## 2023-01-04 DIAGNOSIS — D631 Anemia in chronic kidney disease: Secondary | ICD-10-CM | POA: Diagnosis not present

## 2023-01-04 DIAGNOSIS — E611 Iron deficiency: Secondary | ICD-10-CM | POA: Diagnosis not present

## 2023-01-04 DIAGNOSIS — E1129 Type 2 diabetes mellitus with other diabetic kidney complication: Secondary | ICD-10-CM | POA: Diagnosis not present

## 2023-01-04 DIAGNOSIS — Z992 Dependence on renal dialysis: Secondary | ICD-10-CM | POA: Diagnosis not present

## 2023-01-04 DIAGNOSIS — N186 End stage renal disease: Secondary | ICD-10-CM | POA: Diagnosis not present

## 2023-01-04 DIAGNOSIS — N2581 Secondary hyperparathyroidism of renal origin: Secondary | ICD-10-CM | POA: Diagnosis not present

## 2023-01-04 DIAGNOSIS — E876 Hypokalemia: Secondary | ICD-10-CM | POA: Diagnosis not present

## 2023-01-06 DIAGNOSIS — Z992 Dependence on renal dialysis: Secondary | ICD-10-CM | POA: Diagnosis not present

## 2023-01-06 DIAGNOSIS — N186 End stage renal disease: Secondary | ICD-10-CM | POA: Diagnosis not present

## 2023-01-06 DIAGNOSIS — I871 Compression of vein: Secondary | ICD-10-CM | POA: Diagnosis not present

## 2023-01-07 DIAGNOSIS — Z992 Dependence on renal dialysis: Secondary | ICD-10-CM | POA: Diagnosis not present

## 2023-01-07 DIAGNOSIS — E611 Iron deficiency: Secondary | ICD-10-CM | POA: Diagnosis not present

## 2023-01-07 DIAGNOSIS — E1129 Type 2 diabetes mellitus with other diabetic kidney complication: Secondary | ICD-10-CM | POA: Diagnosis not present

## 2023-01-07 DIAGNOSIS — N2581 Secondary hyperparathyroidism of renal origin: Secondary | ICD-10-CM | POA: Diagnosis not present

## 2023-01-07 DIAGNOSIS — E876 Hypokalemia: Secondary | ICD-10-CM | POA: Diagnosis not present

## 2023-01-07 DIAGNOSIS — D631 Anemia in chronic kidney disease: Secondary | ICD-10-CM | POA: Diagnosis not present

## 2023-01-07 DIAGNOSIS — N186 End stage renal disease: Secondary | ICD-10-CM | POA: Diagnosis not present

## 2023-01-09 DIAGNOSIS — N186 End stage renal disease: Secondary | ICD-10-CM | POA: Diagnosis not present

## 2023-01-09 DIAGNOSIS — E611 Iron deficiency: Secondary | ICD-10-CM | POA: Diagnosis not present

## 2023-01-09 DIAGNOSIS — Z992 Dependence on renal dialysis: Secondary | ICD-10-CM | POA: Diagnosis not present

## 2023-01-09 DIAGNOSIS — E876 Hypokalemia: Secondary | ICD-10-CM | POA: Diagnosis not present

## 2023-01-09 DIAGNOSIS — E1129 Type 2 diabetes mellitus with other diabetic kidney complication: Secondary | ICD-10-CM | POA: Diagnosis not present

## 2023-01-09 DIAGNOSIS — D631 Anemia in chronic kidney disease: Secondary | ICD-10-CM | POA: Diagnosis not present

## 2023-01-09 DIAGNOSIS — N2581 Secondary hyperparathyroidism of renal origin: Secondary | ICD-10-CM | POA: Diagnosis not present

## 2023-01-10 DIAGNOSIS — N186 End stage renal disease: Secondary | ICD-10-CM | POA: Diagnosis not present

## 2023-01-11 DIAGNOSIS — D631 Anemia in chronic kidney disease: Secondary | ICD-10-CM | POA: Diagnosis not present

## 2023-01-11 DIAGNOSIS — Z992 Dependence on renal dialysis: Secondary | ICD-10-CM | POA: Diagnosis not present

## 2023-01-11 DIAGNOSIS — N186 End stage renal disease: Secondary | ICD-10-CM | POA: Diagnosis not present

## 2023-01-11 DIAGNOSIS — E1129 Type 2 diabetes mellitus with other diabetic kidney complication: Secondary | ICD-10-CM | POA: Diagnosis not present

## 2023-01-11 DIAGNOSIS — E876 Hypokalemia: Secondary | ICD-10-CM | POA: Diagnosis not present

## 2023-01-11 DIAGNOSIS — N2581 Secondary hyperparathyroidism of renal origin: Secondary | ICD-10-CM | POA: Diagnosis not present

## 2023-01-11 DIAGNOSIS — E611 Iron deficiency: Secondary | ICD-10-CM | POA: Diagnosis not present

## 2023-01-14 DIAGNOSIS — E611 Iron deficiency: Secondary | ICD-10-CM | POA: Diagnosis not present

## 2023-01-14 DIAGNOSIS — D631 Anemia in chronic kidney disease: Secondary | ICD-10-CM | POA: Diagnosis not present

## 2023-01-14 DIAGNOSIS — Z992 Dependence on renal dialysis: Secondary | ICD-10-CM | POA: Diagnosis not present

## 2023-01-14 DIAGNOSIS — E876 Hypokalemia: Secondary | ICD-10-CM | POA: Diagnosis not present

## 2023-01-14 DIAGNOSIS — N2581 Secondary hyperparathyroidism of renal origin: Secondary | ICD-10-CM | POA: Diagnosis not present

## 2023-01-14 DIAGNOSIS — E1129 Type 2 diabetes mellitus with other diabetic kidney complication: Secondary | ICD-10-CM | POA: Diagnosis not present

## 2023-01-14 DIAGNOSIS — N186 End stage renal disease: Secondary | ICD-10-CM | POA: Diagnosis not present

## 2023-01-16 DIAGNOSIS — N186 End stage renal disease: Secondary | ICD-10-CM | POA: Diagnosis not present

## 2023-01-16 DIAGNOSIS — E1129 Type 2 diabetes mellitus with other diabetic kidney complication: Secondary | ICD-10-CM | POA: Diagnosis not present

## 2023-01-16 DIAGNOSIS — N2581 Secondary hyperparathyroidism of renal origin: Secondary | ICD-10-CM | POA: Diagnosis not present

## 2023-01-16 DIAGNOSIS — Z992 Dependence on renal dialysis: Secondary | ICD-10-CM | POA: Diagnosis not present

## 2023-01-16 DIAGNOSIS — E876 Hypokalemia: Secondary | ICD-10-CM | POA: Diagnosis not present

## 2023-01-16 DIAGNOSIS — E611 Iron deficiency: Secondary | ICD-10-CM | POA: Diagnosis not present

## 2023-01-16 DIAGNOSIS — D631 Anemia in chronic kidney disease: Secondary | ICD-10-CM | POA: Diagnosis not present

## 2023-01-18 DIAGNOSIS — E1129 Type 2 diabetes mellitus with other diabetic kidney complication: Secondary | ICD-10-CM | POA: Diagnosis not present

## 2023-01-18 DIAGNOSIS — E876 Hypokalemia: Secondary | ICD-10-CM | POA: Diagnosis not present

## 2023-01-18 DIAGNOSIS — N2581 Secondary hyperparathyroidism of renal origin: Secondary | ICD-10-CM | POA: Diagnosis not present

## 2023-01-18 DIAGNOSIS — D631 Anemia in chronic kidney disease: Secondary | ICD-10-CM | POA: Diagnosis not present

## 2023-01-18 DIAGNOSIS — E611 Iron deficiency: Secondary | ICD-10-CM | POA: Diagnosis not present

## 2023-01-18 DIAGNOSIS — Z992 Dependence on renal dialysis: Secondary | ICD-10-CM | POA: Diagnosis not present

## 2023-01-18 DIAGNOSIS — N186 End stage renal disease: Secondary | ICD-10-CM | POA: Diagnosis not present

## 2023-01-21 DIAGNOSIS — D631 Anemia in chronic kidney disease: Secondary | ICD-10-CM | POA: Diagnosis not present

## 2023-01-21 DIAGNOSIS — Z992 Dependence on renal dialysis: Secondary | ICD-10-CM | POA: Diagnosis not present

## 2023-01-21 DIAGNOSIS — E611 Iron deficiency: Secondary | ICD-10-CM | POA: Diagnosis not present

## 2023-01-21 DIAGNOSIS — E1129 Type 2 diabetes mellitus with other diabetic kidney complication: Secondary | ICD-10-CM | POA: Diagnosis not present

## 2023-01-21 DIAGNOSIS — E876 Hypokalemia: Secondary | ICD-10-CM | POA: Diagnosis not present

## 2023-01-21 DIAGNOSIS — I129 Hypertensive chronic kidney disease with stage 1 through stage 4 chronic kidney disease, or unspecified chronic kidney disease: Secondary | ICD-10-CM | POA: Diagnosis not present

## 2023-01-21 DIAGNOSIS — N186 End stage renal disease: Secondary | ICD-10-CM | POA: Diagnosis not present

## 2023-01-21 DIAGNOSIS — N2581 Secondary hyperparathyroidism of renal origin: Secondary | ICD-10-CM | POA: Diagnosis not present

## 2023-01-23 DIAGNOSIS — Z992 Dependence on renal dialysis: Secondary | ICD-10-CM | POA: Diagnosis not present

## 2023-01-23 DIAGNOSIS — E876 Hypokalemia: Secondary | ICD-10-CM | POA: Diagnosis not present

## 2023-01-23 DIAGNOSIS — E611 Iron deficiency: Secondary | ICD-10-CM | POA: Diagnosis not present

## 2023-01-23 DIAGNOSIS — N186 End stage renal disease: Secondary | ICD-10-CM | POA: Diagnosis not present

## 2023-01-23 DIAGNOSIS — D631 Anemia in chronic kidney disease: Secondary | ICD-10-CM | POA: Diagnosis not present

## 2023-01-23 DIAGNOSIS — N2581 Secondary hyperparathyroidism of renal origin: Secondary | ICD-10-CM | POA: Diagnosis not present

## 2023-01-25 DIAGNOSIS — N186 End stage renal disease: Secondary | ICD-10-CM | POA: Diagnosis not present

## 2023-01-25 DIAGNOSIS — Z992 Dependence on renal dialysis: Secondary | ICD-10-CM | POA: Diagnosis not present

## 2023-01-25 DIAGNOSIS — D631 Anemia in chronic kidney disease: Secondary | ICD-10-CM | POA: Diagnosis not present

## 2023-01-25 DIAGNOSIS — E611 Iron deficiency: Secondary | ICD-10-CM | POA: Diagnosis not present

## 2023-01-25 DIAGNOSIS — N2581 Secondary hyperparathyroidism of renal origin: Secondary | ICD-10-CM | POA: Diagnosis not present

## 2023-01-25 DIAGNOSIS — E876 Hypokalemia: Secondary | ICD-10-CM | POA: Diagnosis not present

## 2023-01-28 DIAGNOSIS — N186 End stage renal disease: Secondary | ICD-10-CM | POA: Diagnosis not present

## 2023-01-28 DIAGNOSIS — D631 Anemia in chronic kidney disease: Secondary | ICD-10-CM | POA: Diagnosis not present

## 2023-01-28 DIAGNOSIS — N2581 Secondary hyperparathyroidism of renal origin: Secondary | ICD-10-CM | POA: Diagnosis not present

## 2023-01-28 DIAGNOSIS — Z992 Dependence on renal dialysis: Secondary | ICD-10-CM | POA: Diagnosis not present

## 2023-01-28 DIAGNOSIS — E611 Iron deficiency: Secondary | ICD-10-CM | POA: Diagnosis not present

## 2023-01-28 DIAGNOSIS — E876 Hypokalemia: Secondary | ICD-10-CM | POA: Diagnosis not present

## 2023-01-30 DIAGNOSIS — N2581 Secondary hyperparathyroidism of renal origin: Secondary | ICD-10-CM | POA: Diagnosis not present

## 2023-01-30 DIAGNOSIS — E876 Hypokalemia: Secondary | ICD-10-CM | POA: Diagnosis not present

## 2023-01-30 DIAGNOSIS — E611 Iron deficiency: Secondary | ICD-10-CM | POA: Diagnosis not present

## 2023-01-30 DIAGNOSIS — Z992 Dependence on renal dialysis: Secondary | ICD-10-CM | POA: Diagnosis not present

## 2023-01-30 DIAGNOSIS — N186 End stage renal disease: Secondary | ICD-10-CM | POA: Diagnosis not present

## 2023-01-30 DIAGNOSIS — D631 Anemia in chronic kidney disease: Secondary | ICD-10-CM | POA: Diagnosis not present

## 2023-02-01 DIAGNOSIS — D631 Anemia in chronic kidney disease: Secondary | ICD-10-CM | POA: Diagnosis not present

## 2023-02-01 DIAGNOSIS — E611 Iron deficiency: Secondary | ICD-10-CM | POA: Diagnosis not present

## 2023-02-01 DIAGNOSIS — E876 Hypokalemia: Secondary | ICD-10-CM | POA: Diagnosis not present

## 2023-02-01 DIAGNOSIS — Z992 Dependence on renal dialysis: Secondary | ICD-10-CM | POA: Diagnosis not present

## 2023-02-01 DIAGNOSIS — N2581 Secondary hyperparathyroidism of renal origin: Secondary | ICD-10-CM | POA: Diagnosis not present

## 2023-02-01 DIAGNOSIS — N186 End stage renal disease: Secondary | ICD-10-CM | POA: Diagnosis not present

## 2023-02-04 DIAGNOSIS — Z992 Dependence on renal dialysis: Secondary | ICD-10-CM | POA: Diagnosis not present

## 2023-02-04 DIAGNOSIS — D631 Anemia in chronic kidney disease: Secondary | ICD-10-CM | POA: Diagnosis not present

## 2023-02-04 DIAGNOSIS — E876 Hypokalemia: Secondary | ICD-10-CM | POA: Diagnosis not present

## 2023-02-04 DIAGNOSIS — E611 Iron deficiency: Secondary | ICD-10-CM | POA: Diagnosis not present

## 2023-02-04 DIAGNOSIS — N2581 Secondary hyperparathyroidism of renal origin: Secondary | ICD-10-CM | POA: Diagnosis not present

## 2023-02-04 DIAGNOSIS — N186 End stage renal disease: Secondary | ICD-10-CM | POA: Diagnosis not present

## 2023-02-06 DIAGNOSIS — N2581 Secondary hyperparathyroidism of renal origin: Secondary | ICD-10-CM | POA: Diagnosis not present

## 2023-02-06 DIAGNOSIS — N186 End stage renal disease: Secondary | ICD-10-CM | POA: Diagnosis not present

## 2023-02-06 DIAGNOSIS — E876 Hypokalemia: Secondary | ICD-10-CM | POA: Diagnosis not present

## 2023-02-06 DIAGNOSIS — Z992 Dependence on renal dialysis: Secondary | ICD-10-CM | POA: Diagnosis not present

## 2023-02-06 DIAGNOSIS — E611 Iron deficiency: Secondary | ICD-10-CM | POA: Diagnosis not present

## 2023-02-06 DIAGNOSIS — D631 Anemia in chronic kidney disease: Secondary | ICD-10-CM | POA: Diagnosis not present

## 2023-02-08 DIAGNOSIS — N2581 Secondary hyperparathyroidism of renal origin: Secondary | ICD-10-CM | POA: Diagnosis not present

## 2023-02-08 DIAGNOSIS — Z992 Dependence on renal dialysis: Secondary | ICD-10-CM | POA: Diagnosis not present

## 2023-02-08 DIAGNOSIS — D631 Anemia in chronic kidney disease: Secondary | ICD-10-CM | POA: Diagnosis not present

## 2023-02-08 DIAGNOSIS — N186 End stage renal disease: Secondary | ICD-10-CM | POA: Diagnosis not present

## 2023-02-08 DIAGNOSIS — E611 Iron deficiency: Secondary | ICD-10-CM | POA: Diagnosis not present

## 2023-02-08 DIAGNOSIS — E876 Hypokalemia: Secondary | ICD-10-CM | POA: Diagnosis not present

## 2023-02-11 DIAGNOSIS — N2581 Secondary hyperparathyroidism of renal origin: Secondary | ICD-10-CM | POA: Diagnosis not present

## 2023-02-11 DIAGNOSIS — D631 Anemia in chronic kidney disease: Secondary | ICD-10-CM | POA: Diagnosis not present

## 2023-02-11 DIAGNOSIS — Z992 Dependence on renal dialysis: Secondary | ICD-10-CM | POA: Diagnosis not present

## 2023-02-11 DIAGNOSIS — E876 Hypokalemia: Secondary | ICD-10-CM | POA: Diagnosis not present

## 2023-02-11 DIAGNOSIS — N186 End stage renal disease: Secondary | ICD-10-CM | POA: Diagnosis not present

## 2023-02-11 DIAGNOSIS — E611 Iron deficiency: Secondary | ICD-10-CM | POA: Diagnosis not present

## 2023-02-13 DIAGNOSIS — E611 Iron deficiency: Secondary | ICD-10-CM | POA: Diagnosis not present

## 2023-02-13 DIAGNOSIS — N186 End stage renal disease: Secondary | ICD-10-CM | POA: Diagnosis not present

## 2023-02-13 DIAGNOSIS — E876 Hypokalemia: Secondary | ICD-10-CM | POA: Diagnosis not present

## 2023-02-13 DIAGNOSIS — D631 Anemia in chronic kidney disease: Secondary | ICD-10-CM | POA: Diagnosis not present

## 2023-02-13 DIAGNOSIS — Z992 Dependence on renal dialysis: Secondary | ICD-10-CM | POA: Diagnosis not present

## 2023-02-13 DIAGNOSIS — N2581 Secondary hyperparathyroidism of renal origin: Secondary | ICD-10-CM | POA: Diagnosis not present

## 2023-02-15 DIAGNOSIS — N2581 Secondary hyperparathyroidism of renal origin: Secondary | ICD-10-CM | POA: Diagnosis not present

## 2023-02-15 DIAGNOSIS — E876 Hypokalemia: Secondary | ICD-10-CM | POA: Diagnosis not present

## 2023-02-15 DIAGNOSIS — E611 Iron deficiency: Secondary | ICD-10-CM | POA: Diagnosis not present

## 2023-02-15 DIAGNOSIS — N186 End stage renal disease: Secondary | ICD-10-CM | POA: Diagnosis not present

## 2023-02-15 DIAGNOSIS — Z992 Dependence on renal dialysis: Secondary | ICD-10-CM | POA: Diagnosis not present

## 2023-02-15 DIAGNOSIS — D631 Anemia in chronic kidney disease: Secondary | ICD-10-CM | POA: Diagnosis not present

## 2023-02-18 DIAGNOSIS — Z992 Dependence on renal dialysis: Secondary | ICD-10-CM | POA: Diagnosis not present

## 2023-02-18 DIAGNOSIS — E611 Iron deficiency: Secondary | ICD-10-CM | POA: Diagnosis not present

## 2023-02-18 DIAGNOSIS — D631 Anemia in chronic kidney disease: Secondary | ICD-10-CM | POA: Diagnosis not present

## 2023-02-18 DIAGNOSIS — E876 Hypokalemia: Secondary | ICD-10-CM | POA: Diagnosis not present

## 2023-02-18 DIAGNOSIS — N2581 Secondary hyperparathyroidism of renal origin: Secondary | ICD-10-CM | POA: Diagnosis not present

## 2023-02-18 DIAGNOSIS — N186 End stage renal disease: Secondary | ICD-10-CM | POA: Diagnosis not present

## 2023-02-20 DIAGNOSIS — E876 Hypokalemia: Secondary | ICD-10-CM | POA: Diagnosis not present

## 2023-02-20 DIAGNOSIS — Z992 Dependence on renal dialysis: Secondary | ICD-10-CM | POA: Diagnosis not present

## 2023-02-20 DIAGNOSIS — N186 End stage renal disease: Secondary | ICD-10-CM | POA: Diagnosis not present

## 2023-02-20 DIAGNOSIS — D631 Anemia in chronic kidney disease: Secondary | ICD-10-CM | POA: Diagnosis not present

## 2023-02-20 DIAGNOSIS — E611 Iron deficiency: Secondary | ICD-10-CM | POA: Diagnosis not present

## 2023-02-20 DIAGNOSIS — N2581 Secondary hyperparathyroidism of renal origin: Secondary | ICD-10-CM | POA: Diagnosis not present

## 2023-02-21 ENCOUNTER — Telehealth: Payer: Self-pay | Admitting: *Deleted

## 2023-02-21 DIAGNOSIS — Z992 Dependence on renal dialysis: Secondary | ICD-10-CM | POA: Diagnosis not present

## 2023-02-21 DIAGNOSIS — I129 Hypertensive chronic kidney disease with stage 1 through stage 4 chronic kidney disease, or unspecified chronic kidney disease: Secondary | ICD-10-CM | POA: Diagnosis not present

## 2023-02-21 DIAGNOSIS — N186 End stage renal disease: Secondary | ICD-10-CM | POA: Diagnosis not present

## 2023-02-21 NOTE — Telephone Encounter (Signed)
Patient is calling requesting a call back regarding her syringes. [facility] will not fill until she speak with [place].

## 2023-02-21 NOTE — Telephone Encounter (Signed)
#  60 with 11 refills sent 07/11/2022. Patient notified to contact [facility] for this refill.

## 2023-02-22 DIAGNOSIS — N2581 Secondary hyperparathyroidism of renal origin: Secondary | ICD-10-CM | POA: Diagnosis not present

## 2023-02-22 DIAGNOSIS — Z992 Dependence on renal dialysis: Secondary | ICD-10-CM | POA: Diagnosis not present

## 2023-02-22 DIAGNOSIS — E876 Hypokalemia: Secondary | ICD-10-CM | POA: Diagnosis not present

## 2023-02-22 DIAGNOSIS — N186 End stage renal disease: Secondary | ICD-10-CM | POA: Diagnosis not present

## 2023-02-22 DIAGNOSIS — E611 Iron deficiency: Secondary | ICD-10-CM | POA: Diagnosis not present

## 2023-02-22 DIAGNOSIS — D631 Anemia in chronic kidney disease: Secondary | ICD-10-CM | POA: Diagnosis not present

## 2023-02-24 ENCOUNTER — Other Ambulatory Visit: Payer: Self-pay | Admitting: Internal Medicine

## 2023-02-24 DIAGNOSIS — R1013 Epigastric pain: Secondary | ICD-10-CM

## 2023-02-25 DIAGNOSIS — D631 Anemia in chronic kidney disease: Secondary | ICD-10-CM | POA: Diagnosis not present

## 2023-02-25 DIAGNOSIS — Z992 Dependence on renal dialysis: Secondary | ICD-10-CM | POA: Diagnosis not present

## 2023-02-25 DIAGNOSIS — N2581 Secondary hyperparathyroidism of renal origin: Secondary | ICD-10-CM | POA: Diagnosis not present

## 2023-02-25 DIAGNOSIS — N186 End stage renal disease: Secondary | ICD-10-CM | POA: Diagnosis not present

## 2023-02-25 DIAGNOSIS — E876 Hypokalemia: Secondary | ICD-10-CM | POA: Diagnosis not present

## 2023-02-25 DIAGNOSIS — E611 Iron deficiency: Secondary | ICD-10-CM | POA: Diagnosis not present

## 2023-02-27 DIAGNOSIS — N2581 Secondary hyperparathyroidism of renal origin: Secondary | ICD-10-CM | POA: Diagnosis not present

## 2023-02-27 DIAGNOSIS — E611 Iron deficiency: Secondary | ICD-10-CM | POA: Diagnosis not present

## 2023-02-27 DIAGNOSIS — E876 Hypokalemia: Secondary | ICD-10-CM | POA: Diagnosis not present

## 2023-02-27 DIAGNOSIS — N186 End stage renal disease: Secondary | ICD-10-CM | POA: Diagnosis not present

## 2023-02-27 DIAGNOSIS — D631 Anemia in chronic kidney disease: Secondary | ICD-10-CM | POA: Diagnosis not present

## 2023-02-27 DIAGNOSIS — Z992 Dependence on renal dialysis: Secondary | ICD-10-CM | POA: Diagnosis not present

## 2023-03-01 DIAGNOSIS — E611 Iron deficiency: Secondary | ICD-10-CM | POA: Diagnosis not present

## 2023-03-01 DIAGNOSIS — N186 End stage renal disease: Secondary | ICD-10-CM | POA: Diagnosis not present

## 2023-03-01 DIAGNOSIS — E876 Hypokalemia: Secondary | ICD-10-CM | POA: Diagnosis not present

## 2023-03-01 DIAGNOSIS — N2581 Secondary hyperparathyroidism of renal origin: Secondary | ICD-10-CM | POA: Diagnosis not present

## 2023-03-01 DIAGNOSIS — D631 Anemia in chronic kidney disease: Secondary | ICD-10-CM | POA: Diagnosis not present

## 2023-03-01 DIAGNOSIS — Z992 Dependence on renal dialysis: Secondary | ICD-10-CM | POA: Diagnosis not present

## 2023-03-04 DIAGNOSIS — Z992 Dependence on renal dialysis: Secondary | ICD-10-CM | POA: Diagnosis not present

## 2023-03-04 DIAGNOSIS — N2581 Secondary hyperparathyroidism of renal origin: Secondary | ICD-10-CM | POA: Diagnosis not present

## 2023-03-04 DIAGNOSIS — E876 Hypokalemia: Secondary | ICD-10-CM | POA: Diagnosis not present

## 2023-03-04 DIAGNOSIS — D631 Anemia in chronic kidney disease: Secondary | ICD-10-CM | POA: Diagnosis not present

## 2023-03-04 DIAGNOSIS — E611 Iron deficiency: Secondary | ICD-10-CM | POA: Diagnosis not present

## 2023-03-04 DIAGNOSIS — N186 End stage renal disease: Secondary | ICD-10-CM | POA: Diagnosis not present

## 2023-03-06 DIAGNOSIS — E611 Iron deficiency: Secondary | ICD-10-CM | POA: Diagnosis not present

## 2023-03-06 DIAGNOSIS — E876 Hypokalemia: Secondary | ICD-10-CM | POA: Diagnosis not present

## 2023-03-06 DIAGNOSIS — N186 End stage renal disease: Secondary | ICD-10-CM | POA: Diagnosis not present

## 2023-03-06 DIAGNOSIS — Z992 Dependence on renal dialysis: Secondary | ICD-10-CM | POA: Diagnosis not present

## 2023-03-06 DIAGNOSIS — N2581 Secondary hyperparathyroidism of renal origin: Secondary | ICD-10-CM | POA: Diagnosis not present

## 2023-03-06 DIAGNOSIS — D631 Anemia in chronic kidney disease: Secondary | ICD-10-CM | POA: Diagnosis not present

## 2023-03-08 DIAGNOSIS — N2581 Secondary hyperparathyroidism of renal origin: Secondary | ICD-10-CM | POA: Diagnosis not present

## 2023-03-08 DIAGNOSIS — D631 Anemia in chronic kidney disease: Secondary | ICD-10-CM | POA: Diagnosis not present

## 2023-03-08 DIAGNOSIS — E876 Hypokalemia: Secondary | ICD-10-CM | POA: Diagnosis not present

## 2023-03-08 DIAGNOSIS — Z992 Dependence on renal dialysis: Secondary | ICD-10-CM | POA: Diagnosis not present

## 2023-03-08 DIAGNOSIS — N186 End stage renal disease: Secondary | ICD-10-CM | POA: Diagnosis not present

## 2023-03-08 DIAGNOSIS — E611 Iron deficiency: Secondary | ICD-10-CM | POA: Diagnosis not present

## 2023-03-11 ENCOUNTER — Inpatient Hospital Stay (HOSPITAL_COMMUNITY)
Admission: EM | Admit: 2023-03-11 | Discharge: 2023-04-24 | DRG: 915 | Disposition: E | Payer: 59 | Attending: Student in an Organized Health Care Education/Training Program | Admitting: Student in an Organized Health Care Education/Training Program

## 2023-03-11 ENCOUNTER — Encounter (HOSPITAL_COMMUNITY): Payer: Self-pay

## 2023-03-11 ENCOUNTER — Emergency Department (HOSPITAL_COMMUNITY): Payer: 59 | Admitting: Anesthesiology

## 2023-03-11 ENCOUNTER — Emergency Department (HOSPITAL_COMMUNITY): Payer: 59

## 2023-03-11 ENCOUNTER — Encounter (HOSPITAL_COMMUNITY)
Admission: EM | Disposition: E | Payer: Self-pay | Source: Home / Self Care | Attending: Student in an Organized Health Care Education/Training Program

## 2023-03-11 ENCOUNTER — Other Ambulatory Visit: Payer: Self-pay

## 2023-03-11 ENCOUNTER — Inpatient Hospital Stay (HOSPITAL_COMMUNITY): Payer: 59

## 2023-03-11 DIAGNOSIS — E1165 Type 2 diabetes mellitus with hyperglycemia: Secondary | ICD-10-CM | POA: Diagnosis present

## 2023-03-11 DIAGNOSIS — J384 Edema of larynx: Secondary | ICD-10-CM | POA: Diagnosis present

## 2023-03-11 DIAGNOSIS — K5900 Constipation, unspecified: Secondary | ICD-10-CM | POA: Diagnosis not present

## 2023-03-11 DIAGNOSIS — E118 Type 2 diabetes mellitus with unspecified complications: Secondary | ICD-10-CM | POA: Diagnosis present

## 2023-03-11 DIAGNOSIS — Z5919 Other inadequate housing: Secondary | ICD-10-CM

## 2023-03-11 DIAGNOSIS — K861 Other chronic pancreatitis: Secondary | ICD-10-CM | POA: Diagnosis not present

## 2023-03-11 DIAGNOSIS — Z794 Long term (current) use of insulin: Secondary | ICD-10-CM

## 2023-03-11 DIAGNOSIS — R1319 Other dysphagia: Secondary | ICD-10-CM | POA: Diagnosis not present

## 2023-03-11 DIAGNOSIS — T783XXA Angioneurotic edema, initial encounter: Secondary | ICD-10-CM | POA: Diagnosis not present

## 2023-03-11 DIAGNOSIS — Z923 Personal history of irradiation: Secondary | ICD-10-CM

## 2023-03-11 DIAGNOSIS — Z9221 Personal history of antineoplastic chemotherapy: Secondary | ICD-10-CM

## 2023-03-11 DIAGNOSIS — R600 Localized edema: Secondary | ICD-10-CM | POA: Diagnosis not present

## 2023-03-11 DIAGNOSIS — E1122 Type 2 diabetes mellitus with diabetic chronic kidney disease: Secondary | ICD-10-CM | POA: Diagnosis present

## 2023-03-11 DIAGNOSIS — I6523 Occlusion and stenosis of bilateral carotid arteries: Secondary | ICD-10-CM | POA: Diagnosis not present

## 2023-03-11 DIAGNOSIS — Z681 Body mass index (BMI) 19 or less, adult: Secondary | ICD-10-CM | POA: Diagnosis not present

## 2023-03-11 DIAGNOSIS — Z930 Tracheostomy status: Principal | ICD-10-CM

## 2023-03-11 DIAGNOSIS — Z87891 Personal history of nicotine dependence: Secondary | ICD-10-CM

## 2023-03-11 DIAGNOSIS — E05 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: Secondary | ICD-10-CM | POA: Diagnosis present

## 2023-03-11 DIAGNOSIS — M7989 Other specified soft tissue disorders: Secondary | ICD-10-CM | POA: Diagnosis not present

## 2023-03-11 DIAGNOSIS — Z880 Allergy status to penicillin: Secondary | ICD-10-CM

## 2023-03-11 DIAGNOSIS — J988 Other specified respiratory disorders: Secondary | ICD-10-CM

## 2023-03-11 DIAGNOSIS — K31811 Angiodysplasia of stomach and duodenum with bleeding: Secondary | ICD-10-CM | POA: Diagnosis not present

## 2023-03-11 DIAGNOSIS — Z560 Unemployment, unspecified: Secondary | ICD-10-CM

## 2023-03-11 DIAGNOSIS — Z992 Dependence on renal dialysis: Secondary | ICD-10-CM

## 2023-03-11 DIAGNOSIS — R220 Localized swelling, mass and lump, head: Secondary | ICD-10-CM | POA: Diagnosis not present

## 2023-03-11 DIAGNOSIS — Y842 Radiological procedure and radiotherapy as the cause of abnormal reaction of the patient, or of later complication, without mention of misadventure at the time of the procedure: Secondary | ICD-10-CM | POA: Diagnosis present

## 2023-03-11 DIAGNOSIS — D509 Iron deficiency anemia, unspecified: Secondary | ICD-10-CM | POA: Diagnosis not present

## 2023-03-11 DIAGNOSIS — E43 Unspecified severe protein-calorie malnutrition: Secondary | ICD-10-CM | POA: Diagnosis not present

## 2023-03-11 DIAGNOSIS — D649 Anemia, unspecified: Secondary | ICD-10-CM | POA: Diagnosis not present

## 2023-03-11 DIAGNOSIS — Z85819 Personal history of malignant neoplasm of unspecified site of lip, oral cavity, and pharynx: Secondary | ICD-10-CM | POA: Diagnosis not present

## 2023-03-11 DIAGNOSIS — Z888 Allergy status to other drugs, medicaments and biological substances status: Secondary | ICD-10-CM

## 2023-03-11 DIAGNOSIS — D569 Thalassemia, unspecified: Secondary | ICD-10-CM | POA: Diagnosis present

## 2023-03-11 DIAGNOSIS — I120 Hypertensive chronic kidney disease with stage 5 chronic kidney disease or end stage renal disease: Secondary | ICD-10-CM | POA: Diagnosis present

## 2023-03-11 DIAGNOSIS — D841 Defects in the complement system: Secondary | ICD-10-CM | POA: Diagnosis not present

## 2023-03-11 DIAGNOSIS — D631 Anemia in chronic kidney disease: Secondary | ICD-10-CM | POA: Diagnosis not present

## 2023-03-11 DIAGNOSIS — I499 Cardiac arrhythmia, unspecified: Secondary | ICD-10-CM | POA: Diagnosis not present

## 2023-03-11 DIAGNOSIS — Z8249 Family history of ischemic heart disease and other diseases of the circulatory system: Secondary | ICD-10-CM

## 2023-03-11 DIAGNOSIS — Z833 Family history of diabetes mellitus: Secondary | ICD-10-CM

## 2023-03-11 DIAGNOSIS — Z4682 Encounter for fitting and adjustment of non-vascular catheter: Secondary | ICD-10-CM | POA: Diagnosis not present

## 2023-03-11 DIAGNOSIS — I469 Cardiac arrest, cause unspecified: Secondary | ICD-10-CM | POA: Diagnosis not present

## 2023-03-11 DIAGNOSIS — R64 Cachexia: Secondary | ICD-10-CM | POA: Diagnosis present

## 2023-03-11 DIAGNOSIS — J9601 Acute respiratory failure with hypoxia: Secondary | ICD-10-CM | POA: Diagnosis present

## 2023-03-11 DIAGNOSIS — N186 End stage renal disease: Secondary | ICD-10-CM | POA: Diagnosis not present

## 2023-03-11 DIAGNOSIS — I700 Atherosclerosis of aorta: Secondary | ICD-10-CM | POA: Diagnosis not present

## 2023-03-11 DIAGNOSIS — B955 Unspecified streptococcus as the cause of diseases classified elsewhere: Secondary | ICD-10-CM | POA: Diagnosis not present

## 2023-03-11 DIAGNOSIS — Z4659 Encounter for fitting and adjustment of other gastrointestinal appliance and device: Secondary | ICD-10-CM | POA: Diagnosis not present

## 2023-03-11 DIAGNOSIS — X58XXXA Exposure to other specified factors, initial encounter: Secondary | ICD-10-CM | POA: Diagnosis not present

## 2023-03-11 DIAGNOSIS — R7881 Bacteremia: Secondary | ICD-10-CM | POA: Diagnosis present

## 2023-03-11 DIAGNOSIS — K828 Other specified diseases of gallbladder: Secondary | ICD-10-CM | POA: Diagnosis not present

## 2023-03-11 DIAGNOSIS — A401 Sepsis due to streptococcus, group B: Secondary | ICD-10-CM | POA: Diagnosis not present

## 2023-03-11 DIAGNOSIS — E785 Hyperlipidemia, unspecified: Secondary | ICD-10-CM | POA: Diagnosis present

## 2023-03-11 DIAGNOSIS — Z781 Physical restraint status: Secondary | ICD-10-CM

## 2023-03-11 DIAGNOSIS — M898X9 Other specified disorders of bone, unspecified site: Secondary | ICD-10-CM | POA: Diagnosis present

## 2023-03-11 DIAGNOSIS — N2581 Secondary hyperparathyroidism of renal origin: Secondary | ICD-10-CM | POA: Diagnosis not present

## 2023-03-11 DIAGNOSIS — R1314 Dysphagia, pharyngoesophageal phase: Secondary | ICD-10-CM | POA: Insufficient documentation

## 2023-03-11 DIAGNOSIS — E611 Iron deficiency: Secondary | ICD-10-CM | POA: Diagnosis not present

## 2023-03-11 DIAGNOSIS — Z8589 Personal history of malignant neoplasm of other organs and systems: Secondary | ICD-10-CM

## 2023-03-11 DIAGNOSIS — Z841 Family history of disorders of kidney and ureter: Secondary | ICD-10-CM

## 2023-03-11 DIAGNOSIS — B951 Streptococcus, group B, as the cause of diseases classified elsewhere: Secondary | ICD-10-CM | POA: Diagnosis present

## 2023-03-11 DIAGNOSIS — R140 Abdominal distension (gaseous): Secondary | ICD-10-CM | POA: Diagnosis not present

## 2023-03-11 DIAGNOSIS — Z743 Need for continuous supervision: Secondary | ICD-10-CM | POA: Diagnosis not present

## 2023-03-11 DIAGNOSIS — N250 Renal osteodystrophy: Secondary | ICD-10-CM | POA: Diagnosis not present

## 2023-03-11 DIAGNOSIS — R609 Edema, unspecified: Secondary | ICD-10-CM | POA: Diagnosis not present

## 2023-03-11 DIAGNOSIS — E11649 Type 2 diabetes mellitus with hypoglycemia without coma: Secondary | ICD-10-CM | POA: Diagnosis not present

## 2023-03-11 DIAGNOSIS — A419 Sepsis, unspecified organism: Secondary | ICD-10-CM | POA: Diagnosis not present

## 2023-03-11 DIAGNOSIS — Z7951 Long term (current) use of inhaled steroids: Secondary | ICD-10-CM

## 2023-03-11 DIAGNOSIS — E875 Hyperkalemia: Secondary | ICD-10-CM | POA: Diagnosis present

## 2023-03-11 DIAGNOSIS — K130 Diseases of lips: Secondary | ICD-10-CM

## 2023-03-11 DIAGNOSIS — E162 Hypoglycemia, unspecified: Secondary | ICD-10-CM | POA: Diagnosis not present

## 2023-03-11 DIAGNOSIS — R9431 Abnormal electrocardiogram [ECG] [EKG]: Secondary | ICD-10-CM | POA: Diagnosis not present

## 2023-03-11 DIAGNOSIS — C140 Malignant neoplasm of pharynx, unspecified: Secondary | ICD-10-CM | POA: Diagnosis present

## 2023-03-11 DIAGNOSIS — Z931 Gastrostomy status: Secondary | ICD-10-CM | POA: Diagnosis not present

## 2023-03-11 DIAGNOSIS — R001 Bradycardia, unspecified: Secondary | ICD-10-CM | POA: Diagnosis not present

## 2023-03-11 DIAGNOSIS — K219 Gastro-esophageal reflux disease without esophagitis: Secondary | ICD-10-CM | POA: Diagnosis present

## 2023-03-11 DIAGNOSIS — R0902 Hypoxemia: Secondary | ICD-10-CM | POA: Diagnosis not present

## 2023-03-11 DIAGNOSIS — R1310 Dysphagia, unspecified: Secondary | ICD-10-CM | POA: Diagnosis not present

## 2023-03-11 DIAGNOSIS — E876 Hypokalemia: Secondary | ICD-10-CM | POA: Diagnosis not present

## 2023-03-11 DIAGNOSIS — Z9911 Dependence on respirator [ventilator] status: Secondary | ICD-10-CM | POA: Diagnosis not present

## 2023-03-11 DIAGNOSIS — R54 Age-related physical debility: Secondary | ICD-10-CM | POA: Diagnosis present

## 2023-03-11 DIAGNOSIS — Z85818 Personal history of malignant neoplasm of other sites of lip, oral cavity, and pharynx: Secondary | ICD-10-CM

## 2023-03-11 DIAGNOSIS — I959 Hypotension, unspecified: Secondary | ICD-10-CM | POA: Diagnosis not present

## 2023-03-11 DIAGNOSIS — Z79899 Other long term (current) drug therapy: Secondary | ICD-10-CM

## 2023-03-11 DIAGNOSIS — I10 Essential (primary) hypertension: Secondary | ICD-10-CM | POA: Diagnosis present

## 2023-03-11 HISTORY — PX: TRACHEOSTOMY TUBE PLACEMENT: SHX814

## 2023-03-11 HISTORY — DX: Failed or difficult intubation, initial encounter: T88.4XXA

## 2023-03-11 LAB — TROPONIN I (HIGH SENSITIVITY)
Troponin I (High Sensitivity): 15 ng/L (ref <18)
Troponin I (High Sensitivity): 19 ng/L — ABNORMAL HIGH (ref <18)

## 2023-03-11 LAB — GLUCOSE, CAPILLARY
Glucose-Capillary: 224 mg/dL — ABNORMAL HIGH (ref 70–99)
Glucose-Capillary: 156 mg/dL — ABNORMAL HIGH (ref 70–99)
Glucose-Capillary: 140 mg/dL — ABNORMAL HIGH (ref 70–99)

## 2023-03-11 LAB — CBC WITH DIFFERENTIAL/PLATELET
Abs Immature Granulocytes: 0.11 10*3/uL — ABNORMAL HIGH (ref 0.00–0.07)
Basophils Absolute: 0.1 10*3/uL (ref 0.0–0.1)
Basophils Relative: 0 %
Eosinophils Absolute: 0 10*3/uL (ref 0.0–0.5)
Eosinophils Relative: 0 %
HCT: 37.7 % (ref 36.0–46.0)
Hemoglobin: 11.8 g/dL — ABNORMAL LOW (ref 12.0–15.0)
Immature Granulocytes: 1 %
Lymphocytes Relative: 1 %
Lymphs Abs: 0.3 10*3/uL — ABNORMAL LOW (ref 0.7–4.0)
MCH: 24.6 pg — ABNORMAL LOW (ref 26.0–34.0)
MCHC: 31.3 g/dL (ref 30.0–36.0)
MCV: 78.5 fL — ABNORMAL LOW (ref 80.0–100.0)
Monocytes Absolute: 1.3 10*3/uL — ABNORMAL HIGH (ref 0.1–1.0)
Monocytes Relative: 6 %
Neutro Abs: 21.3 10*3/uL — ABNORMAL HIGH (ref 1.7–7.7)
Neutrophils Relative %: 92 %
Platelets: 186 10*3/uL (ref 150–400)
RBC: 4.8 MIL/uL (ref 3.87–5.11)
RDW: 14.4 % (ref 11.5–15.5)
WBC: 23 10*3/uL — ABNORMAL HIGH (ref 4.0–10.5)
nRBC: 0 % (ref 0.0–0.2)

## 2023-03-11 LAB — COMPREHENSIVE METABOLIC PANEL
ALT: 45 U/L — ABNORMAL HIGH (ref 0–44)
AST: 56 U/L — ABNORMAL HIGH (ref 15–41)
Albumin: 3.3 g/dL — ABNORMAL LOW (ref 3.5–5.0)
Alkaline Phosphatase: 160 U/L — ABNORMAL HIGH (ref 38–126)
Anion gap: 13 (ref 5–15)
BUN: 24 mg/dL — ABNORMAL HIGH (ref 8–23)
CO2: 27 mmol/L (ref 22–32)
Calcium: 8.8 mg/dL — ABNORMAL LOW (ref 8.9–10.3)
Chloride: 95 mmol/L — ABNORMAL LOW (ref 98–111)
Creatinine, Ser: 3.23 mg/dL — ABNORMAL HIGH (ref 0.44–1.00)
GFR, Estimated: 15 mL/min — ABNORMAL LOW (ref >60)
Glucose, Bld: 151 mg/dL — ABNORMAL HIGH (ref 70–99)
Potassium: 3.6 mmol/L (ref 3.5–5.1)
Sodium: 135 mmol/L (ref 135–145)
Total Bilirubin: 0.5 mg/dL (ref 0.3–1.2)
Total Protein: 6.8 g/dL (ref 6.5–8.1)

## 2023-03-11 LAB — BLOOD CULTURE ID PANEL (REFLEXED) - BCID2

## 2023-03-11 LAB — MAGNESIUM: Magnesium: 1.8 mg/dL (ref 1.7–2.4)

## 2023-03-11 LAB — I-STAT VENOUS BLOOD GAS, ED
Acid-base deficit: 5 mmol/L — ABNORMAL HIGH (ref 0.0–2.0)
Bicarbonate: 19.6 mmol/L — ABNORMAL LOW (ref 20.0–28.0)
Calcium, Ion: 1 mmol/L — ABNORMAL LOW (ref 1.15–1.40)
HCT: 26 % — ABNORMAL LOW (ref 36.0–46.0)
Hemoglobin: 8.8 g/dL — ABNORMAL LOW (ref 12.0–15.0)
O2 Saturation: 98 %
Potassium: 3.8 mmol/L (ref 3.5–5.1)
Sodium: 132 mmol/L — ABNORMAL LOW (ref 135–145)
TCO2: 21 mmol/L — ABNORMAL LOW (ref 22–32)
pCO2, Ven: 34 mmHg — ABNORMAL LOW (ref 44–60)
pH, Ven: 7.368 (ref 7.25–7.43)
pO2, Ven: 110 mmHg — ABNORMAL HIGH (ref 32–45)

## 2023-03-11 LAB — URINALYSIS, W/ REFLEX TO CULTURE (INFECTION SUSPECTED)
Glucose, UA: 150 mg/dL — AB
Ketones, ur: NEGATIVE mg/dL
Leukocytes,Ua: NEGATIVE
Nitrite: NEGATIVE
Protein, ur: >=300 mg/dL — AB
Specific Gravity, Urine: 1.015 (ref 1.005–1.030)
pH: 5 (ref 5.0–8.0)

## 2023-03-11 LAB — POCT I-STAT 7, (LYTES, BLD GAS, ICA,H+H)
Acid-Base Excess: 1 mmol/L (ref 0.0–2.0)
Bicarbonate: 27.3 mmol/L (ref 20.0–28.0)
Calcium, Ion: 1.1 mmol/L — ABNORMAL LOW (ref 1.15–1.40)
HCT: 38 % (ref 36.0–46.0)
Hemoglobin: 12.9 g/dL (ref 12.0–15.0)
O2 Saturation: 99 %
Potassium: 3.5 mmol/L (ref 3.5–5.1)
Sodium: 132 mmol/L — ABNORMAL LOW (ref 135–145)
TCO2: 29 mmol/L (ref 22–32)
pCO2 arterial: 47.7 mmHg (ref 32–48)
pH, Arterial: 7.365 (ref 7.35–7.45)
pO2, Arterial: 141 mmHg — ABNORMAL HIGH (ref 83–108)

## 2023-03-11 LAB — LACTIC ACID, PLASMA
Lactic Acid, Venous: 1.8 mmol/L (ref 0.5–1.9)
Lactic Acid, Venous: 1.6 mmol/L (ref 0.5–1.9)

## 2023-03-11 LAB — PROTIME-INR
INR: 1 (ref 0.8–1.2)
Prothrombin Time: 12.9 seconds (ref 11.4–15.2)

## 2023-03-11 LAB — MRSA NEXT GEN BY PCR, NASAL: MRSA by PCR Next Gen: NOT DETECTED

## 2023-03-11 LAB — CULTURE, BLOOD (ROUTINE X 2)

## 2023-03-11 LAB — D-DIMER, QUANTITATIVE: D-Dimer, Quant: 0.54 ug/mL-FEU — ABNORMAL HIGH (ref 0.00–0.50)

## 2023-03-11 LAB — BRAIN NATRIURETIC PEPTIDE: B Natriuretic Peptide: 165.6 pg/mL — ABNORMAL HIGH (ref 0.0–100.0)

## 2023-03-11 SURGERY — CREATION, TRACHEOSTOMY
Anesthesia: Monitor Anesthesia Care

## 2023-03-11 MED ORDER — POLYETHYLENE GLYCOL 3350 17 G PO PACK
17.0000 g | PACK | Freq: Every day | ORAL | Status: DC | PRN
Start: 2023-03-11 — End: 2023-03-11

## 2023-03-11 MED ORDER — PANTOPRAZOLE SODIUM 40 MG IV SOLR
40.0000 mg | Freq: Every day | INTRAVENOUS | Status: DC
  Administered 2023-03-11 – 2023-03-18 (×8): 40 mg via INTRAVENOUS
  Filled 2023-03-11 (×8): qty 10

## 2023-03-11 MED ORDER — ORAL CARE MOUTH RINSE: 15.0000 mL | OROMUCOSAL | Status: DC | PRN

## 2023-03-11 MED ORDER — SODIUM CHLORIDE 0.9 % IV SOLN: INTRAVENOUS | Status: DC | PRN

## 2023-03-11 MED ORDER — PROPOFOL 10 MG/ML IV BOLUS
INTRAVENOUS | Status: AC
  Filled 2023-03-11: qty 20

## 2023-03-11 MED ORDER — MIDAZOLAM HCL 5 MG/5ML IJ SOLN
INTRAMUSCULAR | Status: DC | PRN
  Administered 2023-03-11 (×2): 1 mg via INTRAVENOUS

## 2023-03-11 MED ORDER — IPRATROPIUM-ALBUTEROL 0.5-2.5 (3) MG/3ML IN SOLN
3.0000 mL | Freq: Once | RESPIRATORY_TRACT | Status: AC
  Administered 2023-03-11: 3 mL via RESPIRATORY_TRACT
  Filled 2023-03-11: qty 3

## 2023-03-11 MED ORDER — MIDAZOLAM HCL 2 MG/2ML IJ SOLN
INTRAMUSCULAR | Status: AC
  Filled 2023-03-11: qty 2

## 2023-03-11 MED ORDER — HYDRALAZINE HCL 50 MG PO TABS
50.0000 mg | ORAL_TABLET | Freq: Two times a day (BID) | ORAL | Status: DC
  Administered 2023-03-12: 50 mg via ORAL
  Filled 2023-03-11: qty 1

## 2023-03-11 MED ORDER — PROPOFOL 10 MG/ML IV BOLUS
INTRAVENOUS | Status: DC | PRN
  Administered 2023-03-11: 50 mg via INTRAVENOUS

## 2023-03-11 MED ORDER — VERAPAMIL HCL ER 180 MG PO TBCR
360.0000 mg | EXTENDED_RELEASE_TABLET | Freq: Every day | ORAL | Status: DC
  Filled 2023-03-11: qty 2

## 2023-03-11 MED ORDER — INSULIN GLARGINE-YFGN 100 UNIT/ML ~~LOC~~ SOLN
15.0000 [IU] | Freq: Two times a day (BID) | SUBCUTANEOUS | Status: DC
  Administered 2023-03-11: 15 [IU] via SUBCUTANEOUS
  Filled 2023-03-11 (×3): qty 0.15

## 2023-03-11 MED ORDER — ORAL CARE MOUTH RINSE
15.0000 mL | OROMUCOSAL | Status: DC
  Administered 2023-03-11 – 2023-03-19 (×90): 15 mL via OROMUCOSAL

## 2023-03-11 MED ORDER — FENTANYL CITRATE (PF) 250 MCG/5ML IJ SOLN
INTRAMUSCULAR | Status: AC
  Filled 2023-03-11: qty 5

## 2023-03-11 MED ORDER — PROPOFOL 500 MG/50ML IV EMUL
INTRAVENOUS | Status: DC | PRN
  Administered 2023-03-11: 50 ug/kg/min via INTRAVENOUS

## 2023-03-11 MED ORDER — CHLORHEXIDINE GLUCONATE CLOTH 2 % EX PADS
6.0000 | MEDICATED_PAD | Freq: Every day | CUTANEOUS | Status: DC
  Administered 2023-03-11 – 2023-03-21 (×9): 6 via TOPICAL

## 2023-03-11 MED ORDER — INSULIN ASPART 100 UNIT/ML IJ SOLN
0.0000 [IU] | INTRAMUSCULAR | Status: DC
  Administered 2023-03-11: 2 [IU] via SUBCUTANEOUS
  Administered 2023-03-11 – 2023-03-13 (×3): 1 [IU] via SUBCUTANEOUS
  Administered 2023-03-13: 4 [IU] via SUBCUTANEOUS

## 2023-03-11 MED ORDER — FAMOTIDINE IN NACL 20-0.9 MG/50ML-% IV SOLN
20.0000 mg | Freq: Once | INTRAVENOUS | Status: AC
  Administered 2023-03-11: 20 mg via INTRAVENOUS
  Filled 2023-03-11: qty 50

## 2023-03-11 MED ORDER — DOCUSATE SODIUM 100 MG PO CAPS: 100.0000 mg | ORAL_CAPSULE | Freq: Two times a day (BID) | ORAL | Status: DC | PRN

## 2023-03-11 MED ORDER — PROPOFOL 1000 MG/100ML IV EMUL
0.0000 ug/kg/min | INTRAVENOUS | Status: DC
  Administered 2023-03-11 (×2): 50 ug/kg/min via INTRAVENOUS
  Administered 2023-03-12 (×2): 40 ug/kg/min via INTRAVENOUS
  Administered 2023-03-12 – 2023-03-14 (×7): 50 ug/kg/min via INTRAVENOUS
  Administered 2023-03-14: 40 ug/kg/min via INTRAVENOUS
  Administered 2023-03-14: 50 ug/kg/min via INTRAVENOUS
  Administered 2023-03-15: 25 ug/kg/min via INTRAVENOUS
  Administered 2023-03-15: 50 ug/kg/min via INTRAVENOUS
  Filled 2023-03-11 (×13): qty 100

## 2023-03-11 MED ORDER — METHYLPREDNISOLONE SODIUM SUCC 125 MG IJ SOLR
125.0000 mg | Freq: Once | INTRAMUSCULAR | Status: AC
  Administered 2023-03-11: 125 mg via INTRAVENOUS
  Filled 2023-03-11: qty 2

## 2023-03-11 MED ORDER — FENTANYL CITRATE PF 50 MCG/ML IJ SOSY
25.0000 ug | PREFILLED_SYRINGE | INTRAMUSCULAR | Status: DC | PRN
  Administered 2023-03-16: 25 ug via INTRAVENOUS
  Filled 2023-03-11: qty 1

## 2023-03-11 MED ORDER — DEXAMETHASONE SODIUM PHOSPHATE 4 MG/ML IJ SOLN
4.0000 mg | Freq: Four times a day (QID) | INTRAMUSCULAR | Status: DC
  Administered 2023-03-11 – 2023-03-15 (×15): 4 mg via INTRAVENOUS
  Filled 2023-03-11 (×17): qty 1

## 2023-03-11 MED ORDER — HEPARIN SODIUM (PORCINE) 5000 UNIT/ML IJ SOLN
5000.0000 [IU] | Freq: Three times a day (TID) | INTRAMUSCULAR | Status: DC
  Administered 2023-03-12 – 2023-03-16 (×13): 5000 [IU] via SUBCUTANEOUS
  Filled 2023-03-11 (×13): qty 1

## 2023-03-11 MED ORDER — DOCUSATE SODIUM 50 MG/5ML PO LIQD
100.0000 mg | Freq: Two times a day (BID) | ORAL | Status: DC
  Administered 2023-03-12 – 2023-03-17 (×7): 100 mg
  Filled 2023-03-11 (×7): qty 10

## 2023-03-11 MED ORDER — POLYETHYLENE GLYCOL 3350 17 G PO PACK
17.0000 g | PACK | Freq: Every day | ORAL | Status: DC
  Administered 2023-03-13 – 2023-03-14 (×2): 17 g
  Filled 2023-03-11 (×2): qty 1

## 2023-03-11 MED ORDER — DEXMEDETOMIDINE HCL IN NACL 200 MCG/50ML IV SOLN
INTRAVENOUS | Status: DC | PRN
  Administered 2023-03-11: 1 ug/kg/h via INTRAVENOUS

## 2023-03-11 MED ORDER — CEFAZOLIN SODIUM-DEXTROSE 2-4 GM/100ML-% IV SOLN
2.0000 g | Freq: Every day | INTRAVENOUS | Status: DC
  Administered 2023-03-11: 2 g via INTRAVENOUS
  Filled 2023-03-11: qty 100

## 2023-03-11 MED ORDER — FENTANYL CITRATE PF 50 MCG/ML IJ SOSY
25.0000 ug | PREFILLED_SYRINGE | INTRAMUSCULAR | Status: DC | PRN
  Administered 2023-03-11 (×2): 100 ug via INTRAVENOUS
  Administered 2023-03-12 (×2): 50 ug via INTRAVENOUS
  Administered 2023-03-12 – 2023-03-13 (×7): 100 ug via INTRAVENOUS
  Administered 2023-03-13: 50 ug via INTRAVENOUS
  Administered 2023-03-14: 100 ug via INTRAVENOUS
  Administered 2023-03-14: 50 ug via INTRAVENOUS
  Administered 2023-03-14 (×4): 100 ug via INTRAVENOUS
  Administered 2023-03-14 – 2023-03-15 (×3): 50 ug via INTRAVENOUS
  Administered 2023-03-15 (×3): 100 ug via INTRAVENOUS
  Administered 2023-03-16 (×2): 50 ug via INTRAVENOUS
  Filled 2023-03-11: qty 2
  Filled 2023-03-11 (×3): qty 1
  Filled 2023-03-11: qty 2
  Filled 2023-03-11: qty 1
  Filled 2023-03-11: qty 2
  Filled 2023-03-11 (×6): qty 1
  Filled 2023-03-11: qty 2
  Filled 2023-03-11: qty 1
  Filled 2023-03-11 (×4): qty 2
  Filled 2023-03-11: qty 1
  Filled 2023-03-11 (×3): qty 2
  Filled 2023-03-11: qty 1
  Filled 2023-03-11 (×6): qty 2

## 2023-03-11 MED ORDER — CLONIDINE HCL 0.2 MG/24HR TD PTWK
0.2000 mg | MEDICATED_PATCH | TRANSDERMAL | Status: DC
  Administered 2023-03-11: 0.2 mg via TRANSDERMAL
  Filled 2023-03-11: qty 1

## 2023-03-11 MED ORDER — DIPHENHYDRAMINE HCL 50 MG/ML IJ SOLN
25.0000 mg | Freq: Once | INTRAMUSCULAR | Status: AC
  Administered 2023-03-11: 25 mg via INTRAVENOUS
  Filled 2023-03-11: qty 1

## 2023-03-11 MED ORDER — LACTATED RINGERS IV BOLUS
250.0000 mL | Freq: Once | INTRAVENOUS | Status: AC
  Administered 2023-03-11: 250 mL via INTRAVENOUS

## 2023-03-11 SURGICAL SUPPLY — 3 items
KIT TURNOVER KIT A (KITS) IMPLANT
PAD ARMBOARD 7.5X6 YLW CONV (MISCELLANEOUS) ×2 IMPLANT
POSITIONER HEAD DONUT 9IN (MISCELLANEOUS) ×1 IMPLANT

## 2023-03-11 NOTE — ED Triage Notes (Signed)
Pt BIB [facility] from dialysis d/t lip swelling. Pt reports she ate chicken & dumplings last night & that was when the swelling started, she took benadryl this morning before dialysis to treat it. She received 1 hr of dialysis before being sent here for evaluation. A/Ox4, ambulatory, denies pain or any difficulty breathing. Rt arm restrict, 154/86, CBG 157, 126 bpm.

## 2023-03-11 NOTE — ED Provider Notes (Signed)
[place] EMERGENCY DEPARTMENT AT [facility] Provider Note   CSN: [id number] Arrival date & time: 03/11/23  8295     History  Chief Complaint  Patient presents with   Lips Swelling    Susan Evans is a 67 y.o. female.  HPI Patient presents for lip swelling.  Medical history includes T2DM, HTN, ESRD, Graves' disease, HLD, anemia, head/neck cancer, malnutrition.  For cancer, she had previously underwent surgery and radiation.  She is now undergoing yearly surveillance checkup appointments.  She goes to dialysis on T, TH, SA.  She has been adherent to her hemodialysis schedule.  Patient noticed lip swelling last night.  She reports that she does get this intermittently.  This morning, she took Benadryl prior to going to dialysis.  When she went to dialysis, they tried to take off extra fluid to help with the swelling.  While undergoing dialysis, patient had increasing heart rate.  Swelling did not improve.  Dialysis was discontinued after 1 hour and patient was sent to the ED by EMS.  Currently, patient has no physical complaints other than the lip swelling.  She states that her voice and breathing seem normal to her.  She denies any abdominal pain or nausea.  She denies any chest tightness or any other areas of swelling sensation.    Home Medications Prior to Admission medications   Medication Sig Start Date End Date Taking? Authorizing Provider  Accu-Chek FastClix Lancets MISC 5 (FIVE) TIMES DAILY DIAGNOSIS CODE E11.9 09/10/19   Tyson Alias, MD  acetaminophen (TYLENOL) 325 MG tablet Take 2 tablets (650 mg total) by mouth every 4 (four) hours as needed for mild pain (temp > 101.5). 12/24/21   Lynnell Catalan, MD  atorvastatin (LIPITOR) 80 MG tablet TAKE 1 TABLET BY MOUTH EVERY DAY 12/31/22   Willette Cluster, MD  B-D UF III MINI PEN NEEDLES 31G X 5 MM MISC USE TO INJECT NOVOLOG 3 TIMES A DAY. DIAGNOSIS CODE E11.9, Z79.4 04/03/21   Atway, Rayann N, DO  BD VEO INSULIN  SYRINGE U/F 31G X 15/64" 0.3 ML MISC USE TO INJECT LANTUS INSULIN TWO TIMES A DAY 07/11/22   Willette Cluster, MD  Blood Glucose Monitoring Suppl (ACCU-CHEK AVIVA PLUS) w/Device KIT Use to test blood glucose  3 times daily 12/15/16   Arnetha Courser, MD  calcium-vitamin D (CALCIUM 500+D HIGH POTENCY) 500-400 MG-UNIT tablet Take 1 tablet by mouth daily. 02/29/20   Kirt Boys, MD  Cholecalciferol (VITAMIN D3) 50 MCG (2000 UT) capsule TAKE 1 CAPSULE BY MOUTH EVERY DAY 04/26/22   Atway, Rayann N, DO  cloNIDine (CATAPRES - DOSED IN MG/24 HR) 0.2 mg/24hr patch Place 1 patch (0.2 mg total) onto the skin once a week. 03/13/22   Carmel Sacramento, MD  fluticasone (FLOVENT HFA) 44 MCG/ACT inhaler INHALE 2 PUFFS BY MOUTH INTO THE LUNGS DAILY 12/03/22   Willette Cluster, MD  glucose blood (ACCU-CHEK GUIDE) test strip TEST 5 (FIVE) TIMES DAILY DIAGNOSIS CODE E11.9 11/23/20   Alphonzo Severance, MD  hydrALAZINE (APRESOLINE) 50 MG tablet TAKE 1 TABLET BY MOUTH TWICE A DAY 12/31/22   Willette Cluster, MD  HYDROcodone-acetaminophen (NORCO) 5-325 MG tablet Take 1 tablet by mouth every 6 (six) hours as needed for moderate pain. 12/27/20   Emilie Rutter, PA-C  insulin aspart (NOVOLOG FLEXPEN) 100 UNIT/ML FlexPen Inject 13 Units into the skin daily after breakfast AND 10 Units 2 (two) times daily with a meal. 03/13/22   Carmel Sacramento, MD  insulin glargine (LANTUS)  100 UNIT/ML injection Inject 0.3 mLs (30 Units total) into the skin 2 (two) times daily. Inject 0.34 mL into the skin once daily 03/13/22   Carmel Sacramento, MD  lidocaine-prilocaine (EMLA) cream Apply 1 application. topically as needed (dialysis).    [provider]  loratadine (CLARITIN) 10 MG tablet Take 1 tablet (10 mg total) by mouth daily. 06/14/20 03/11/23  Alphonzo Severance, MD  omeprazole (PRILOSEC) 20 MG capsule TAKE 1 CAPSULE BY MOUTH EVERY DAY 03/03/23   Willette Cluster, MD  Polyethyl Glycol-Propyl Glycol (SYSTANE OP) Place 1 drop into both eyes daily as needed (for dry eyes).     [provider]  sevelamer carbonate (RENVELA) 800 MG tablet Take 800 mg by mouth 3 (three) times daily with meals.    [provider]  verapamil (CALAN-SR) 180 MG CR tablet TAKE 2 TABLETS (360 MG TOTAL) BY MOUTH AT BEDTIME. 12/03/22   Willette Cluster, MD      Allergies    Hydrochlorothiazide and Penicillins    Review of Systems   Review of Systems  HENT:  Positive for facial swelling.   All other systems reviewed and are negative.   Physical Exam Updated Vital Signs BP 133/68   Pulse 93   Temp 98.9 F (37.2 C) (Axillary)   Resp 20   Ht 5\' 2"  (1.575 m)   Wt 41 kg Comment: Estimated weight  SpO2 100%   BMI 16.53 kg/m  Physical Exam Vitals and nursing note reviewed.  Constitutional:      General: She is not in acute distress.    Appearance: She is well-developed and normal weight. She is not toxic-appearing or diaphoretic.  HENT:     Head: Normocephalic and atraumatic.     Right Ear: External ear normal.     Left Ear: External ear normal.     Nose: Nose normal.     Mouth/Throat:     Mouth: Mucous membranes are moist.     Comments: Lip swelling is present. Eyes:     Extraocular Movements: Extraocular movements intact.     Conjunctiva/sclera: Conjunctivae normal.  Cardiovascular:     Rate and Rhythm: Regular rhythm. Tachycardia present.     Heart sounds: No murmur heard. Pulmonary:     Effort: Pulmonary effort is normal. No respiratory distress.     Breath sounds: Rhonchi present. No wheezing or rales.  Abdominal:     General: There is no distension.     Palpations: Abdomen is soft.     Tenderness: There is no abdominal tenderness. There is no guarding.  Musculoskeletal:        General: No swelling. Normal range of motion.     Cervical back: Normal range of motion and neck supple.     Right lower leg: No edema.     Left lower leg: No edema.  Skin:    General: Skin is warm and dry.     Coloration: Skin is not jaundiced or pale.  Neurological:      General: No focal deficit present.     Mental Status: She is alert and oriented to person, place, and time.  Psychiatric:        Mood and Affect: Mood normal.        Behavior: Behavior normal.        Thought Content: Thought content normal.        Judgment: Judgment normal.     ED Results / Procedures / Treatments   Labs (all labs ordered are listed,  but only abnormal results are displayed) Labs Reviewed  COMPREHENSIVE METABOLIC PANEL - Abnormal; Notable for the following components:      Result Value   Chloride 95 (*)    Glucose, Bld 151 (*)    BUN 24 (*)    Creatinine, Ser 3.23 (*)    Calcium 8.8 (*)    Albumin 3.3 (*)    AST 56 (*)    ALT 45 (*)    Alkaline Phosphatase 160 (*)    GFR, Estimated 15 (*)    All other components within normal limits  CBC WITH DIFFERENTIAL/PLATELET - Abnormal; Notable for the following components:   WBC 23.0 (*)    Hemoglobin 11.8 (*)    MCV 78.5 (*)    MCH 24.6 (*)    Neutro Abs 21.3 (*)    Lymphs Abs 0.3 (*)    Monocytes Absolute 1.3 (*)    Abs Immature Granulocytes 0.11 (*)    All other components within normal limits  D-DIMER, QUANTITATIVE - Abnormal; Notable for the following components:   D-Dimer, Quant 0.54 (*)    All other components within normal limits  GLUCOSE, CAPILLARY - Abnormal; Notable for the following components:   Glucose-Capillary 224 (*)    All other components within normal limits  I-STAT VENOUS BLOOD GAS, ED - Abnormal; Notable for the following components:   pCO2, Ven 34.0 (*)    pO2, Ven 110 (*)    Bicarbonate 19.6 (*)    TCO2 21 (*)    Acid-base deficit 5.0 (*)    Sodium 132 (*)    Calcium, Ion 1.00 (*)    HCT 26.0 (*)    Hemoglobin 8.8 (*)    All other components within normal limits  POCT I-STAT 7, (LYTES, BLD GAS, ICA,H+H) - Abnormal; Notable for the following components:   pO2, Arterial 141 (*)    Sodium 132 (*)    Calcium, Ion 1.10 (*)    All other components within normal limits  TROPONIN I  (HIGH SENSITIVITY) - Abnormal; Notable for the following components:   Troponin I (High Sensitivity) 19 (*)    All other components within normal limits  MRSA NEXT GEN BY PCR, NASAL  CULTURE, BLOOD (ROUTINE X 2)  CULTURE, BLOOD (ROUTINE X 2)  LACTIC ACID, PLASMA  LACTIC ACID, PLASMA  PROTIME-INR  MAGNESIUM  URINALYSIS, W/ REFLEX TO CULTURE (INFECTION SUSPECTED)  BRAIN NATRIURETIC PEPTIDE  BLOOD GAS, ARTERIAL  HIV ANTIBODY (ROUTINE TESTING W REFLEX)  CBC  BASIC METABOLIC PANEL  MAGNESIUM  TRIGLYCERIDES  TROPONIN I (HIGH SENSITIVITY)    EKG EKG Interpretation  Date/Time:  Tuesday March 11 2023 08:58:49 EDT Ventricular Rate:  129 PR Interval:  111 QRS Duration: 68 QT Interval:  333 QTC Calculation: 488 R Axis:   79 Text Interpretation: Sinus tachycardia LAE, consider biatrial enlargement Anteroseptal infarct, old Minimal ST depression, anterolateral leads Confirmed by Gloris Manchester [phone]) on 03/11/2023 9:51:12 AM  Radiology DG CHEST PORT 1 VIEW  Result Date: 03/11/2023 CLINICAL DATA:  Intubation EXAM: PORTABLE CHEST 1 VIEW COMPARISON:  Chest x-ray 03/11/2023 FINDINGS: Endotracheal tube tip is 1 cm above the carina. The lungs are clear. There is no pleural effusion or pneumothorax. No acute fractures are seen. IMPRESSION: Endotracheal tube tip is 1 cm above the carina. Electronically Signed   By: Darliss Cheney M.D.   On: 03/11/2023 17:03   DG Chest Port 1 View  Result Date: 03/11/2023 CLINICAL DATA:  Sepsis. EXAM: PORTABLE CHEST 1 VIEW COMPARISON:  December 21, 2021. FINDINGS: The heart size and mediastinal contours are within normal limits. Both lungs are clear. The visualized skeletal structures are unremarkable. IMPRESSION: No active disease. Electronically Signed   By: Lupita Raider M.D.   On: 03/11/2023 10:27    Procedures Procedures    Medications Ordered in ED Medications  Chlorhexidine Gluconate Cloth 2 % PADS 6 each (6 each Topical Given 03/11/23 1502)  Oral care mouth  rinse (15 mLs Mouth Rinse Given 03/11/23 1606)  Oral care mouth rinse (has no administration in time range)  heparin injection 5,000 Units (has no administration in time range)  pantoprazole (PROTONIX) injection 40 mg (has no administration in time range)  insulin aspart (novoLOG) injection 0-6 Units (2 Units Subcutaneous Given 03/11/23 1549)  docusate (COLACE) 50 MG/5ML liquid 100 mg (has no administration in time range)  polyethylene glycol (MIRALAX / GLYCOLAX) packet 17 g (17 g Per Tube Not Given 03/11/23 1532)  fentaNYL (SUBLIMAZE) injection 25 mcg (has no administration in time range)  fentaNYL (SUBLIMAZE) injection 25-100 mcg (100 mcg Intravenous Given 03/11/23 1605)  propofol (DIPRIVAN) 1000 MG/100ML infusion (50 mcg/kg/min  41 kg Intravenous New Bag/Given 03/11/23 1606)  cloNIDine (CATAPRES - Dosed in mg/24 hr) patch 0.2 mg (0.2 mg Transdermal Patch Applied 03/11/23 1705)  hydrALAZINE (APRESOLINE) tablet 50 mg (has no administration in time range)  insulin glargine-yfgn (SEMGLEE) injection 15 Units (has no administration in time range)  dexamethasone (DECADRON) injection 4 mg (4 mg Intravenous Given 03/11/23 1705)  diphenhydrAMINE (BENADRYL) injection 25 mg (25 mg Intravenous Given 03/11/23 0950)  methylPREDNISolone sodium succinate (SOLU-MEDROL) 125 mg/2 mL injection 125 mg (125 mg Intravenous Given 03/11/23 0949)  famotidine (PEPCID) IVPB 20 mg premix (0 mg Intravenous Stopped 03/11/23 1059)  lactated ringers bolus 250 mL ( Intravenous Stopped 03/11/23 1351)  ipratropium-albuterol (DUONEB) 0.5-2.5 (3) MG/3ML nebulizer solution 3 mL (3 mLs Nebulization Given 03/11/23 1305)    ED Course/ Medical Decision Making/ A&P                             Medical Decision Making Amount and/or Complexity of Data Reviewed Labs: ordered. Radiology: ordered. ECG/medicine tests: ordered.  Risk Prescription drug management.   This patient presents to the ED for concern of lip swelling, this involves an  extensive number of treatment options, and is a complaint that carries with it a high risk of complications and morbidity.  The differential diagnosis includes angioedema, allergic reaction, infection   Co morbidities that complicate the patient evaluation  T2DM, HTN, ESRD, Graves' disease, HLD, anemia, head/neck cancer, malnutrition   Additional history obtained:  Additional history obtained from patient's daughter External records from outside source obtained and reviewed including EMR   Lab Tests:  I Ordered, and personally interpreted labs.  The pertinent results include: Leukocytosis is present.  Anemia is baseline.  Troponin is normal.  Lactate is normal.  D-dimer, when age-adjusted, is normal.  Creatinine is baseline.   Imaging Studies ordered:  I ordered imaging studies including chest x-ray I independently visualized and interpreted imaging which showed no acute findings I agree with the radiologist interpretation   Cardiac Monitoring: / EKG:  The patient was maintained on a cardiac monitor.  I personally viewed and interpreted the cardiac monitored which showed an underlying rhythm of: Sinus rhythm   Consultations Obtained:  I requested consultation with the otolaryngologist, Dr. Jenne Pane,  and discussed lab and imaging findings as well as pertinent plan -  they recommend: Bedside flexible laryngoscopy   Problem List / ED Course / Critical interventions / Medication management  Patient presents for lip swelling.  She reports that onset was last night.  She took some Benadryl this morning and went to dialysis.  At dialysis, patient was noted to have tachycardia.  HD session was cut short after 1 hour and patient was sent to the ED.  On arrival in the ED, she remains tachycardic.  She has upper and lower lip swelling present.  I do not appreciate any tongue swelling.  Patient has a hoarse voice which she says is baseline for her.  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  On review of EMR, it does not appear that she is on any ACE inhibitors.  She was treated here in the ED with Pepcid, Benadryl, and Solu-Medrol.  Workup was initiated.  Lab work was notable for a leukocytosis.  While in the ED, she appeared to have slight worsening of her lip and facial swelling.  She remained tachycardic despite gentle IV fluids.  ENT was consulted.  Dr. Jenne Pane graciously came and evaluated the patient at bedside.  Otolaryngologist, Dr. Jenne Pane, made plans for fiberoptic intubation in the operating room with subsequent admission to CCM.  Patient was transferred to the OR for further management. I ordered medication including Pepcid, Benadryl, and Solu-Medrol for empiric treatment of allergic action and/or angioedema; IV fluids for hydration Reevaluation of the patient after these medicines showed that the patient worsened I have reviewed the patients home medicines and have made adjustments as needed   Social Determinants of Health:  Lives independently  CRITICAL CARE Performed by: Gloris Manchester   Total critical care time: 34 minutes  Critical care time was exclusive of separately billable procedures and treating other patients.  Critical care was necessary to treat or prevent imminent or life-threatening deterioration.  Critical care was time spent personally by me on the following activities: development of treatment plan with patient and/or surrogate as well as nursing, discussions with consultants, evaluation of patient's response to treatment, examination of patient, obtaining history from patient or surrogate, ordering and performing treatments and interventions, ordering and review of laboratory studies, ordering and review of radiographic studies, pulse oximetry and re-evaluation of patient's condition.         Final Clinical Impression(s) / ED Diagnoses Final diagnoses:  Angioedema, initial encounter    Rx /  DC Orders ED Discharge Orders     None         Gloris Manchester, MD 03/11/23 1731

## 2023-03-11 NOTE — Anesthesia Preprocedure Evaluation (Signed)
Anesthesia Evaluation  Patient identified by MRN, date of birth, ID band Patient awake    Reviewed: Unable to perform ROS - Chart review onlyPreop documentation limited or incomplete due to emergent nature of procedure.  Airway Mallampati: IV   Neck ROM: Limited  Mouth opening: Limited Mouth Opening Comment: Lips and tongue swollen Dental  (+) Edentulous Upper, Edentulous Lower   Pulmonary shortness of breath, at rest and lying, former smoker Airway swelling   breath sounds clear to auscultation       Cardiovascular hypertension, Pt. on medications  Rhythm:Regular Rate:Tachycardia     Neuro/Psych negative neurological ROS  negative psych ROS   GI/Hepatic Neg liver ROS,GERD  ,,  Endo/Other  diabetes, Well Controlled    Renal/GU ESRF and DialysisRenal disease  negative genitourinary   Musculoskeletal Hx/o RT to neck   Abdominal   Peds  Hematology  (+) Blood dyscrasia, anemia   Anesthesia Other Findings   Reproductive/Obstetrics                              Anesthesia Physical Anesthesia Plan  ASA: 4  Anesthesia Plan: MAC and General   Post-op Pain Management: Minimal or no pain anticipated   Induction: Intravenous  PONV Risk Score and Plan: 3  Airway Management Planned: Fiberoptic Intubation Planned and Awake Intubation Planned  Additional Equipment: None  Intra-op Plan:   Post-operative Plan: Post-operative intubation/ventilation  Informed Consent: I have reviewed the patients History and Physical, chart, labs and discussed the procedure including the risks, benefits and alternatives for the proposed anesthesia with the patient or authorized representative who has indicated his/her understanding and acceptance.       Plan Discussed with: Anesthesiologist, CRNA and Surgeon  Anesthesia Plan Comments:          Anesthesia Quick Evaluation

## 2023-03-11 NOTE — ED Notes (Signed)
Consent form filled out by ENT & signed by pt's daughter & witnessed by this RN, for pt to go to OR for intubation.

## 2023-03-11 NOTE — Progress Notes (Deleted)
HTN BP well controlled at 125/62 today. -Continue verapamil 360 mg daily -Continue clonidine patch 0.2 mg weekly -Continue hydralazine 50 mg twice daily  Graves Patient to see [facility] endocrinology 2/29. Says they check her TSH yearly, please ensure this is done. Appears she has been in remission from her graves for many years following methimazole treatment.   T2DM Patient last visited the clinic 02/2022 and since A1c has increased from 7.5 to 9.0. She reports taking novolog 5u AM and is prescribed 13u. Says she decreased based on her numbers on her glucometer. Otherwise taking novolog 10 w lunch and dinner, lantus 30 BID as prescribed. Not a candidate for metformin or SGLT2 based on GFR. BMI 15 so not a good candidate for GLP-1. Did not bring her meter to clinic. Will come back in 2 weeks with meter for insulin titration.  **A1c   Osteoporosis Declined osteoporosis therapy in past Vitamin D?  Anemia of CKD **  Hyperlipidemia Lipitor 80 mg Lipid panel   HCM Mammo Lipid panel FOBT/colon cancer screening

## 2023-03-11 NOTE — Anesthesia Procedure Notes (Signed)
Procedure Name: Intubation Date/Time: 03/11/2023 2:19 PM  Performed by: Montez Morita, Allex Madia W, CRNAPre-anesthesia Checklist: Patient identified, Emergency Drugs available, Suction available, Patient being monitored and Timeout performed Patient Re-evaluated:Patient Re-evaluated prior to induction Oxygen Delivery Method: Circle system utilized Preoxygenation: Pre-oxygenation with 100% oxygen Grade View: Grade I Nasal Tubes: Nasal prep performed, Nasal Rae and Right Tube size: 6.0 mm Number of attempts: 1 Airway Equipment and Method: Fiberoptic brochoscope Placement Confirmation: ETT inserted through vocal cords under direct vision, positive ETCO2 and breath sounds checked- equal and bilateral Secured at: 27 cm Tube secured with: Tape Dental Injury: Teeth and Oropharynx as per pre-operative assessment  Difficulty Due To: Difficulty was anticipated Comments: Patient brought to the room straight from the ER. Dr. Malen Gauze and Dr. Jenne Pane at bedside. O2 sats 100% and patient maintaining airway. Blow by O2 with facemask and dexmedetomidine IV started. Fiberoptic scope placed in R nare after dilation with a lubricated 6.0 nasal trumpet. Oral ETT 6.0 placed on fiberoptic and placed with + ETCO2, chest rise and bilateral breathsounds. Gas started and airway secured.

## 2023-03-11 NOTE — Op Note (Signed)
Preop diagnosis: Laryngeal and lip edema, stridor Postop diagnosis: same Procedure: Fiberoptic intubation Surgeon: Jenne Pane Assist: None Anesth: IV sedation Compl: None Findings: Pharyngeal anatomy narrowed, epiglottis thickened.  Supraglottic edema. Description:  After discussing risks, benefits, and alternatives, the patient was brought to the operative suite and placed on the operative table in the supine position.  The patient was given light IV sedation.  A nasal trumpet was placed through the right nasal passage and then removed.  The fiberoptic bronchoscope was inserted through the right nasal passage with an endotracheal tube over the scope.  The scope was passed through the glottis with some difficulty and the endotracheal tube was then passed into the airway over the scope.  The scope was removed, the patient was given further sedation, and the anesthesia circuit was attached.  The patient was successfully ventilated.  The fiberoptic scope was reinserted to confirm positioning of the endotracheal tube.  After this was confirmed and the scope was removed, the tube was taped in position.  She was then returned to Anesthesia management and transferred to the ICU in stable condition.

## 2023-03-11 NOTE — Transfer of Care (Signed)
Immediate Anesthesia Transfer of Care Note  Patient: Susan Evans  Procedure(s) Performed: AWAKE INTUBATION  Patient Location: PACU  Anesthesia Type:General  Level of Consciousness: Patient remains intubated per anesthesia plan  Airway & Oxygen Therapy: Patient remains intubated per anesthesia plan and Patient placed on Ventilator (see vital sign flow sheet for setting)  Post-op Assessment: Report given to RN and Post -op Vital signs reviewed and stable  Post vital signs: Reviewed and stable  Last Vitals:  Vitals Value Taken Time  BP 200/86 03/11/23 1456  Temp    Pulse 99 03/11/23 1502  Resp 20 03/11/23 1502  SpO2 99 % 03/11/23 1502  Vitals shown include unvalidated device data.  Last Pain:  Vitals:   03/11/23 1323  TempSrc: Oral  PainSc:          Complications:  Encounter Notable Events  Notable Event Outcome Phase Comment  Difficult to intubate - expected  Intraprocedure Filed from anesthesia note documentation.

## 2023-03-11 NOTE — Brief Op Note (Signed)
03/11/2023  2:27 PM  PATIENT:  Susan Evans  67 y.o. female  PRE-OPERATIVE DIAGNOSIS:  Laryngeal edema and lips  POST-OPERATIVE DIAGNOSIS:  same  PROCEDURE:  Procedure(s): AWAKE INTUBATION POSSIBLE TRACHEOSTOMY (N/A)  SURGEON:  Surgeon(s) and Role:    Christia Reading, MD - Primary  PHYSICIAN ASSISTANT:   ASSISTANTS: none   ANESTHESIA:   IV sedation  EBL:  None   BLOOD ADMINISTERED:none  DRAINS: none   LOCAL MEDICATIONS USED:  NONE  SPECIMEN:  No Specimen  DISPOSITION OF SPECIMEN:  N/A  COUNTS:  YES  TOURNIQUET:  * No tourniquets in log *  DICTATION: .Note written in EPIC  PLAN OF CARE: Admit to inpatient   PATIENT DISPOSITION:  ICU - intubated and hemodynamically stable.   Delay start of Pharmacological VTE agent (>24hrs) due to surgical blood loss or risk of bleeding: no

## 2023-03-11 NOTE — Progress Notes (Signed)
OG tube and NG tube attempted with no success. Felt like inflammation and instructed not to push too much. CCM notified.

## 2023-03-11 NOTE — ED Notes (Signed)
X-ray at bedside

## 2023-03-11 NOTE — Consult Note (Signed)
Reason for Consult: Lip swelling Referring Physician: ER  Susan Evans is an 67 y.o. female.  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  She started developing lip swelling yesterday that was noted today at dialysis.  There was effort to take off some extra fluid but she became tachycardic and felt bad so she was transferred to the ER.  Here, her lip swelling has appeared to worsen somewhat.  She has some increased work of breathing and hoarseness.  She had a similar event occur in the past, according to her daughter, when intubation was required and tracheostomy was considered but ultimately not needed.  Past Medical History:  Diagnosis Date   AKI (acute kidney injury) (HCC) 08/12/2019   Anaphylactic shock, unspecified, initial encounter 05/09/2020   Chronic kidney disease    Dialysis T/Th/Sat- [address]   CKD stage G1/A3, GFR > 90 and albumin creatinine ratio >300 mg/g 07/08/2016   Nephrotic range proteinuria   Diabetes mellitus    type 2    Dyspnea    "when I have too much fluid."   Ganglion cyst    right wrist   GERD (gastroesophageal reflux disease)    History of radiation therapy 02/27/10 -04/13/10   left base of tongue, bilat neck   Hyperlipidemia    Hypertension    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/21/2021   Iron deficiency anemia    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   Menopause    Thyroid disease    Hyperthyroidism   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/08/2020   Tobacco abuse    PT HAS QUIT    Past Surgical History:  Procedure Laterality Date   AV FISTULA PLACEMENT Left 06/02/2020   Procedure: INSERTION OF ARTERIOVENOUS (AV) GORE-TEX GRAFT ARM LEFT;  Surgeon: Cephus Shelling, MD;  Location: [facility] OR;  Service: Vascular;  Laterality: Left;   AV FISTULA PLACEMENT Right 12/27/2020   Procedure: RIGHT ARM CEPHALIC  ARTERIOVENOUS FISTULA CREATION;  Surgeon: Maeola Harman, MD;  Location: [facility] OR;  Service: Vascular;  Laterality: Right;   AVGG REMOVAL Left 12/21/2021   Procedure: EXCISION OF LEFT ARM ARTERIOVENOUS GORETEX GRAFT (AVGG);  Surgeon: Maeola Harman, MD;  [facility];  Service: Vascular;  Laterality: Left;   IR FLUORO GUIDE CV LINE LEFT  05/15/2020   IR REMOVAL TUN CV CATH W/O FL  05/15/2020   IR REMOVAL TUN CV CATH W/O FL  06/15/2021   IR US GUIDE VASC ACCESS LEFT  05/15/2020   NECK SURGERY     REDUCTION MAMMAPLASTY      Family History  Problem Relation Age of Onset   Stroke Father    Cancer Father        unknown ca   Hypertension Mother    Diabetes Mother    Chronic Renal Failure Sister    Chronic Renal Failure Brother    Cancer Paternal Aunt        colon ca   Breast cancer Neg Hx     Social History:  reports that she quit smoking about 23 months ago. Her smoking use included cigarettes. She smoked an average of .3 packs per day. She has never been exposed to tobacco smoke. She has never used smokeless tobacco. She reports that she does not drink alcohol and does not use drugs.  Allergies:  Allergies  Allergen Reactions   Hydrochlorothiazide  Itching and Other (See Comments)    Hyponatremia    Penicillins Other (See Comments)    "takes all of her hair out" .Has patient had a PCN reaction causing immediate rash, facial/tongue/throat swelling, SOB or lightheadedness with hypotension: No Has patient had a PCN reaction causing severe rash involving mucus membranes or skin necrosis: No Has patient had a PCN reaction that required hospitalization No Has patient had a PCN reaction occurring within the last 10 years: No If all of the above answers are "NO", then may proceed with Cephalosporin use.     Medications: I have reviewed the patient's current medications.  Results for orders placed or performed during the hospital encounter of 03/11/23 (from the past 48  hour(s))  Lactic acid, plasma     Status: None   Collection Time: 03/11/23  9:09 AM  Result Value Ref Range   Lactic Acid, Venous 1.8 0.5 - 1.9 mmol/L    Comment: Performed at [facility] Lab, [address] [place], [place] [address]  Comprehensive metabolic panel     Status: Abnormal   Collection Time: 03/11/23  9:09 AM  Result Value Ref Range   Sodium 135 135 - 145 mmol/L   Potassium 3.6 3.5 - 5.1 mmol/L   Chloride 95 (L) 98 - 111 mmol/L   CO2 27 22 - 32 mmol/L   Glucose, Bld 151 (H) 70 - 99 mg/dL    Comment: Glucose reference range applies only to samples taken after fasting for at least 8 hours.   BUN 24 (H) 8 - 23 mg/dL   Creatinine, Ser 6.04 (H) 0.44 - 1.00 mg/dL   Calcium 8.8 (L) 8.9 - 10.3 mg/dL   Total Protein 6.8 6.5 - 8.1 g/dL   Albumin 3.3 (L) 3.5 - 5.0 g/dL   AST 56 (H) 15 - 41 U/L   ALT 45 (H) 0 - 44 U/L   Alkaline Phosphatase 160 (H) 38 - 126 U/L   Total Bilirubin 0.5 0.3 - 1.2 mg/dL   GFR, Estimated 15 (L) >60 mL/min    Comment: (NOTE) Calculated using the CKD-EPI Creatinine Equation (2021)    Anion gap 13 5 - 15    Comment: Performed at [facility] Lab, [address], [place], [place] [address]  CBC with Differential     Status: Abnormal   Collection Time: 03/11/23  9:09 AM  Result Value Ref Range   WBC 23.0 (H) 4.0 - 10.5 K/uL   RBC 4.80 3.87 - 5.11 MIL/uL   Hemoglobin 11.8 (L) 12.0 - 15.0 g/dL   HCT 11.9 14.7 - 82.9 %   MCV 78.5 (L) 80.0 - 100.0 fL   MCH 24.6 (L) 26.0 - 34.0 pg   MCHC 31.3 30.0 - 36.0 g/dL   RDW 56.2 13.0 - 86.5 %   Platelets 186 150 - 400 K/uL    Comment: REPEATED TO VERIFY   nRBC 0.0 0.0 - 0.2 %   Neutrophils Relative % 92 %   Neutro Abs 21.3 (H) 1.7 - 7.7 K/uL   Lymphocytes Relative 1 %   Lymphs Abs 0.3 (L) 0.7 - 4.0 K/uL   Monocytes Relative 6 %   Monocytes Absolute 1.3 (H) 0.1 - 1.0 K/uL   Eosinophils Relative 0 %   Eosinophils Absolute 0.0 0.0 - 0.5 K/uL   Basophils Relative 0 %   Basophils Absolute 0.1 0.0 - 0.1  K/uL   Immature Granulocytes 1 %   Abs Immature Granulocytes 0.11 (H) 0.00 - 0.07  K/uL    Comment: Performed at [facility], [address], [place], [place] [address]  Protime-INR     Status: None   Collection Time: 03/11/23  9:09 AM  Result Value Ref Range   Prothrombin Time 12.9 11.4 - 15.2 seconds   INR 1.0 0.8 - 1.2    Comment: (NOTE) INR goal varies based on device and disease states. Performed at [facility], [address] [place], [place] [address]   Troponin I (High Sensitivity)     Status: None   Collection Time: 03/11/23  9:09 AM  Result Value Ref Range   Troponin I (High Sensitivity) 15 <18 ng/L    Comment: (NOTE) Elevated high sensitivity troponin I (hsTnI) values and significant  changes across serial measurements may suggest ACS but many other  chronic and acute conditions are known to elevate hsTnI results.  Refer to the "Links" section for chest pain algorithms and additional  guidance. Performed at [facility], [address], [place], [place] [address]   Magnesium     Status: None   Collection Time: 03/11/23  9:09 AM  Result Value Ref Range   Magnesium 1.8 1.7 - 2.4 mg/dL    Comment: Performed at [facility], [address] [place], [place] [address]  D-dimer, quantitative     Status: Abnormal   Collection Time: 03/11/23  9:09 AM  Result Value Ref Range   D-Dimer, Quant 0.54 (H) 0.00 - 0.50 ug/mL-FEU    Comment: (NOTE) At the manufacturer cut-off value of 0.5 g/mL FEU, this assay has a negative predictive value of 95-100%.This assay is intended for use in conjunction with a clinical pretest probability (PTP) assessment model to exclude pulmonary embolism (PE) and deep venous thrombosis (DVT) in outpatients suspected of PE or DVT. Results should be correlated with clinical presentation. Performed at [facility] Lab, [address], [place], [place] [address]   Troponin I (High Sensitivity)     Status: Abnormal    Collection Time: 03/11/23 11:44 AM  Result Value Ref Range   Troponin I (High Sensitivity) 19 (H) <18 ng/L    Comment: (NOTE) Elevated high sensitivity troponin I (hsTnI) values and significant  changes across serial measurements may suggest ACS but many other  chronic and acute conditions are known to elevate hsTnI results.  Refer to the "Links" section for chest pain algorithms and additional  guidance. Performed at [facility], [address] [place], [place] [address]   Lactic acid, plasma     Status: None   Collection Time: 03/11/23 11:45 AM  Result Value Ref Range   Lactic Acid, Venous 1.6 0.5 - 1.9 mmol/L    Comment: Performed at [facility] Lab, [address], [place], [place] [address]  I-Stat venous blood gas, [facility] ED, [facility], [facility])     Status: Abnormal   Collection Time: 03/11/23  1:20 PM  Result Value Ref Range   pH, Ven 7.368 7.25 - 7.43   pCO2, Ven 34.0 (L) 44 - 60 mmHg   pO2, Ven 110 (H) 32 - 45 mmHg   Bicarbonate 19.6 (L) 20.0 - 28.0 mmol/L   TCO2 21 (L) 22 - 32 mmol/L   O2 Saturation 98 %   Acid-base deficit 5.0 (H) 0.0 - 2.0 mmol/L   Sodium 132 (L) 135 - 145 mmol/L   Potassium 3.8 3.5 - 5.1 mmol/L   Calcium, Ion 1.00 (L) 1.15 - 1.40 mmol/L  HCT 26.0 (L) 36.0 - 46.0 %   Hemoglobin 8.8 (L) 12.0 - 15.0 g/dL   Sample type VENOUS     DG Chest Port 1 View  Result Date: 03/11/2023 CLINICAL DATA:  Sepsis. EXAM: PORTABLE CHEST 1 VIEW COMPARISON:  December 21, 2021. FINDINGS: The heart size and mediastinal contours are within normal limits. Both lungs are clear. The visualized skeletal structures are unremarkable. IMPRESSION: No active disease. Electronically Signed   By: Lupita Raider M.D.   On: 03/11/2023 10:27    Review of Systems  HENT:  Positive for voice change.   Respiratory:  Positive for stridor.   All other systems reviewed and are negative.  Blood pressure (!) 178/99, pulse (!) 122, temperature 99 F (37.2 C), temperature source Oral, resp. rate  18, SpO2 94 %. Physical Exam Constitutional:      Appearance: Normal appearance. She is normal weight.  HENT:     Head: Normocephalic and atraumatic.     Right Ear: External ear normal.     Left Ear: External ear normal.     Nose: Nose normal.     Mouth/Throat:     Comments: Lips edematous.  Tongue normal.  Limited view of oropharynx.  Voice mildly hoarse. Eyes:     Extraocular Movements: Extraocular movements intact.     Conjunctiva/sclera: Conjunctivae normal.     Pupils: Pupils are equal, round, and reactive to light.  Neck:     Comments: Woody neck. Cardiovascular:     Rate and Rhythm: Tachycardia present.  Pulmonary:     Comments: Moderate inspiratory stridor and work of breathing. Neurological:     General: No focal deficit present.     Mental Status: She is alert and oriented to person, place, and time.  Psychiatric:        Mood and Affect: Mood normal.        Behavior: Behavior normal.        Thought Content: Thought content normal.        Judgment: Judgment normal.     Assessment/Plan: Lip and laryngeal edema, stridor  Fiberoptic exam of the pharynx and larynx was performed at the bedside.  See operative note.  With these findings and her breathing status, I recommended planned fiberoptic intubation in the operating room.  Risks, benefits, and alternatives were discussed and she and her daughter expressed understanding and agreement.  Further airway management, thereafter, will depend on her progress.  Will admit to CCM.   Christia Reading 03/11/2023, 1:52 PM

## 2023-03-11 NOTE — ED Notes (Signed)
Rectal Temperature 99.5

## 2023-03-11 NOTE — H&P (Signed)
NAME:  Susan Evans, MRN:  [id number], DOB:  01-16-1956, LOS: 0 ADMISSION DATE:  03/11/2023, CONSULTATION DATE:  03/11/23 REFERRING MD:  ENT, CHIEF COMPLAINT:  airway compromise, vent management    History of Present Illness:  67yo female with hx ESRD, DM, GERD, remote hx laryngeal cancer treated with surgery and XRT in 2011 presented 6/18 with lip swelling noted at dialysis where they attempted extra volume removal but she did not tolerate this r/t tachycardia and malaise so she was sent to ER where her lip swelling progressed. She also developed SOB and hoarse voice and ENT was consulted. She was ultimately taken to OR and nasally intubated by ENT.  Post op she was brought to ICU and PCCM consulted for ICU mgmt.  Pt denies recent medication changes or new medications  She had similar event ~1 year ago requiring intubation. Janina Mayo was considered at the time but ultimately not needed and no clear cause identified.   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   Significant Hospital Events: Including procedures, antibiotic start and stop dates in addition to other pertinent events     Interim History / Subjective:  Seen in ICU post OR for nasal intubation by ENT with fiberoptic scope   Objective   Blood pressure (!) 200/86, pulse 95, temperature 98.9 F (37.2 C), temperature source  Axillary, resp. rate 18, height 5\' 2"  (1.575 m), weight 41 kg, SpO2 100 %.    Vent Mode: PRVC FiO2 (%):  [40 %-100 %] 40 % Set Rate:  [18 bmp] 18 bmp Vt Set:  [400 mL] 400 mL PEEP:  [5 cmH20] 5 cmH20 Plateau Pressure:  [16 cmH20] 16 cmH20   Intake/Output Summary (Last 24 hours) at 03/11/2023 1544 Last data filed at 03/11/2023 1446 Gross per 24 hour  Intake 50 ml  Output --  Net 50 ml   Filed Weights   03/11/23 1520  Weight: 41 kg    Examination: General: thin, chronically ill appearing female, NAD post anesthesia  HENT: mm moist, large lips and tongue, nasally intubated R nare  Lungs: resps even non labored on vent  Cardiovascular: s1s2 rrr  Abdomen: soft Extremities: warm and dry, no edema  Neuro: sedated post op, propofol, RASS -2   Resolved Hospital Problem list     Assessment & Plan:  Subglottic edema/ compromised airway- unclear etiology. S/p fiberoptic nasal intubation by ENT in OR 6/18. Of note had similar previous episode ~1 year ago  Post op vent management  PLAN -  ENT following  Vent support - 8cc/kg  F/u CXR  F/u ABG Decadron  ?further imaging  Needs allergy input on d/c and rx for epi pen   Sedation needs on vent  PLAN  PAD protocol  Propofol, PRN fentanyl   ESRD  PLAN - Will ask renal to see, d/w dr Signe Colt  F/u chem   DM  PLAN -  Continue basal insulin and SSI  Semglee 15units BID   HTN  PLAN -  Resume home clonidine patch, hydralazine, verapamil  Monitor closely on propofol    Best Practice (right click and "Reselect all SmartList Selections" daily)   Diet/type: NPO DVT prophylaxis: SCD GI prophylaxis: PPI Lines: N/A Foley:  N/A Code Status:  full code Last date of multidisciplinary goals of care discussion []   Labs   CBC: Recent Labs  Lab 03/11/23 0909 03/11/23 1320  WBC 23.0*  --   NEUTROABS 21.3*  --   HGB 11.8* 8.8*  HCT 37.7 26.0*  MCV 78.5*  --   PLT 186  --     Basic Metabolic Panel: Recent Labs  Lab  03/11/23 0909 03/11/23 1320  NA 135 132*  K 3.6 3.8  CL 95*  --   CO2 27  --   GLUCOSE 151*  --   BUN 24*  --   CREATININE 3.23*  --   CALCIUM 8.8*  --   MG 1.8  --    GFR: Estimated Creatinine Clearance: 11.1 mL/min (A) (by C-G formula based on SCr of 3.23 mg/dL (H)). Recent Labs  Lab 03/11/23 0909 03/11/23 1145  WBC 23.0*  --   LATICACIDVEN 1.8 1.6    Liver Function Tests: Recent Labs  Lab 03/11/23 0909  AST 56*  ALT 45*  ALKPHOS 160*  BILITOT 0.5  PROT 6.8  ALBUMIN 3.3*   No results for input(s): "LIPASE", "AMYLASE" in the last 168 hours. No results for input(s): "AMMONIA" in the last 168 hours.  ABG    Component Value Date/Time   PHART 7.487 (H) 12/21/2021 1705   PCO2ART 33.8 12/21/2021 1705   PO2ART 179 (H) 12/21/2021 1705   HCO3 19.6 (L) 03/11/2023 1320   TCO2 21 (L) 03/11/2023 1320   ACIDBASEDEF 5.0 (H) 03/11/2023 1320   O2SAT 98 03/11/2023 1320     Coagulation Profile: Recent Labs  Lab 03/11/23 0909  INR 1.0    Cardiac Enzymes: No results for input(s): "CKTOTAL", "CKMB", "CKMBINDEX", "TROPONINI" in the last 168 hours.  HbA1C: Hemoglobin A1C  Date/Time Value Ref Range Status  11/20/2022 01:47 PM 9.0 (A) 4.0 - 5.6 % Final  03/13/2022 12:02 PM 7.5 (A) 4.0 - 5.6 % Final   Hgb A1c MFr Bld  Date/Time Value Ref Range Status  12/21/2021 07:37 PM 6.8 (H) 4.8 - 5.6 % Final    Comment:    (NOTE) Pre diabetes:          5.7%-6.4%  Diabetes:              >6.4%  Glycemic control for   <7.0% adults with diabetes   08/06/2010 02:27 PM 8.8 %     CBG: Recent Labs  Lab 03/11/23 1509  GLUCAP 224*    Review of Systems:   As per HPI   Past Medical History:  She,  has a past medical history of AKI (acute kidney injury) (HCC) (08/12/2019), Anaphylactic shock, unspecified, initial encounter (05/09/2020), Chronic kidney disease, CKD stage G1/A3, GFR > 90 and albumin creatinine ratio >300 mg/g (07/08/2016), Diabetes mellitus, Dyspnea, Ganglion  cyst, GERD (gastroesophageal reflux disease), History of radiation therapy (02/27/10 -04/13/10), Hyperlipidemia, Hypertension, Infection of graft (HCC) (12/21/2021), Iron deficiency anemia, Malignant neoplasm (HCC) (12/2009), Menopause, Thyroid disease, 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   Surgical History:   Past Surgical History:  Procedure Laterality Date   AV FISTULA PLACEMENT Left 06/02/2020   Procedure: INSERTION OF ARTERIOVENOUS (AV) GORE-TEX GRAFT ARM LEFT;  Surgeon: Cephus Shelling, MD;  Location: [facility] OR;  Service: Vascular;  Laterality: Left;   AV FISTULA PLACEMENT Right 12/27/2020   Procedure: RIGHT ARM CEPHALIC ARTERIOVENOUS FISTULA CREATION;  Surgeon: Maeola Harman, MD;  Location: [facility] OR;  Service: Vascular;  Laterality: Right;   AVGG REMOVAL Left 12/21/2021   Procedure: EXCISION OF LEFT ARM ARTERIOVENOUS GORETEX GRAFT (AVGG);  Surgeon: Maeola Harman, MD;  Location: [facility] OR;  Service: Vascular;  Laterality: Left;   IR FLUORO GUIDE CV LINE LEFT  05/15/2020   IR REMOVAL TUN CV CATH W/O FL  05/15/2020   IR REMOVAL TUN CV CATH W/O FL  06/15/2021   IR US GUIDE VASC ACCESS LEFT  05/15/2020   NECK SURGERY     REDUCTION MAMMAPLASTY       Social History:   reports that she quit smoking about 23 months ago. Her smoking use included cigarettes. She smoked an average of .3 packs per day. She has never been exposed to tobacco smoke. She has never used smokeless tobacco. She reports that she does not drink alcohol and does not use drugs.   Family History:  Her family history includes Cancer in her father and paternal aunt; Chronic Renal Failure in her brother and sister; Diabetes in her mother; Hypertension in her mother; Stroke in her father. There is no history of Breast cancer.   Allergies Allergies  Allergen Reactions   Hydrochlorothiazide Itching and Other (See Comments)    Hyponatremia     Penicillins Other (See Comments)    "takes all of her hair out" .Has patient had a PCN reaction causing immediate rash, facial/tongue/throat swelling, SOB or lightheadedness with hypotension: No Has patient had a PCN reaction causing severe rash involving mucus membranes or skin necrosis: No Has patient had a PCN reaction that required hospitalization No Has patient had a PCN reaction occurring within the last 10 years: No If all of the above answers are "NO", then may proceed with Cephalosporin use.      Home Medications  Prior to Admission medications   Medication Sig Start Date End Date Taking? Authorizing Provider  Accu-Chek FastClix Lancets MISC 5 (FIVE) TIMES DAILY DIAGNOSIS CODE E11.9 09/10/19   Tyson Alias, MD  acetaminophen (TYLENOL) 325 MG tablet Take 2 tablets (650 mg total) by mouth every 4 (four) hours as needed for mild pain (temp > 101.5). 12/24/21   Lynnell Catalan, MD  atorvastatin (LIPITOR) 80 MG tablet TAKE 1 TABLET BY MOUTH EVERY DAY 12/31/22   Willette Cluster, MD  B-D UF III MINI PEN NEEDLES 31G X 5 MM MISC USE TO INJECT NOVOLOG 3 TIMES A DAY. DIAGNOSIS CODE E11.9, Z79.4 04/03/21   Atway, Rayann N, DO  BD VEO INSULIN SYRINGE U/F 31G X 15/64" 0.3 ML MISC USE TO INJECT LANTUS INSULIN TWO TIMES A DAY 07/11/22   Willette Cluster, MD  Blood Glucose Monitoring Suppl (ACCU-CHEK AVIVA PLUS) w/Device KIT Use to test blood glucose  3 times daily 12/15/16   Arnetha Courser, MD  calcium-vitamin D (CALCIUM 500+D HIGH POTENCY) 500-400 MG-UNIT tablet Take 1 tablet by mouth daily. 02/29/20   Kirt Boys, MD  Cholecalciferol (VITAMIN D3) 50 MCG (2000 UT) capsule TAKE 1 CAPSULE BY MOUTH EVERY DAY 04/26/22   Atway, Rayann N, DO  cloNIDine (CATAPRES - DOSED IN MG/24 HR) 0.2 mg/24hr patch Place 1 patch (0.2 mg total) onto the skin once a week. 03/13/22   Carmel Sacramento, MD  fluticasone (FLOVENT HFA) 44 MCG/ACT inhaler INHALE 2 PUFFS BY MOUTH INTO THE LUNGS DAILY 12/03/22   Willette Cluster, MD  glucose blood (ACCU-CHEK GUIDE) test strip TEST 5 (FIVE) TIMES DAILY DIAGNOSIS CODE E11.9 11/23/20   Alphonzo Severance, MD  hydrALAZINE (APRESOLINE) 50 MG tablet TAKE 1 TABLET BY MOUTH TWICE A DAY 12/31/22   Willette Cluster, MD  HYDROcodone-acetaminophen (NORCO) 5-325 MG tablet Take 1 tablet by mouth every 6 (six) hours as needed for moderate pain. 12/27/20   Emilie Rutter, PA-C  insulin aspart (NOVOLOG FLEXPEN) 100 UNIT/ML FlexPen Inject 13 Units into the skin daily after breakfast AND 10 Units 2 (two) times daily with a meal. 03/13/22   Carmel Sacramento, MD  insulin glargine (LANTUS) 100 UNIT/ML injection Inject 0.3 mLs (30 Units total) into the skin 2 (two) times daily. Inject 0.34 mL into the skin once daily 03/13/22   Carmel Sacramento, MD  lidocaine-prilocaine (EMLA) cream Apply 1 application. topically as needed (dialysis).    [provider]  loratadine (CLARITIN) 10 MG tablet Take 1 tablet (10 mg total) by mouth daily. 06/14/20 01/15/23  Alphonzo Severance, MD  omeprazole (PRILOSEC) 20 MG capsule TAKE 1 CAPSULE BY MOUTH EVERY DAY 03/03/23   Willette Cluster, MD  Polyethyl Glycol-Propyl Glycol (SYSTANE OP) Place 1 drop into both eyes daily as needed (for dry eyes).    [provider]  sevelamer carbonate (RENVELA) 800 MG tablet Take 800 mg by mouth 3 (three) times daily with meals.    [provider]  verapamil (CALAN-SR) 180 MG CR tablet TAKE 2 TABLETS (360 MG TOTAL) BY MOUTH AT BEDTIME. 12/03/22   Willette Cluster, MD     Critical care time:     Dirk Dress, NP Pulmonary/Critical Care Medicine  03/11/2023  3:44 PM

## 2023-03-11 NOTE — Anesthesia Postprocedure Evaluation (Signed)
Anesthesia Post Note  Patient: Susan Evans  Procedure(s) Performed: AWAKE INTUBATION     Patient location during evaluation: ICU Anesthesia Type: General and MAC Level of consciousness: patient remains intubated per anesthesia plan Pain management: pain level controlled Vital Signs Assessment: post-procedure vital signs reviewed and stable Respiratory status: respiratory function unstable, patient on ventilator - see flowsheet for VS and patient remains intubated per anesthesia plan Cardiovascular status: blood pressure returned to baseline and stable Postop Assessment: no apparent nausea or vomiting Anesthetic complications: yes   Encounter Notable Events  Notable Event Outcome Phase Comment  Difficult to intubate - expected  Intraprocedure Filed from anesthesia note documentation.     Last Pain:  Vitals:   03/11/23 1510  TempSrc: Axillary  PainSc:                  Brandolyn Shortridge A.

## 2023-03-11 NOTE — Progress Notes (Addendum)
PHARMACY - PHYSICIAN COMMUNICATION CRITICAL VALUE ALERT - BLOOD CULTURE IDENTIFICATION (BCID)  Susan Evans is an 67 y.o. female with ESRD on HD who presented to [facility] on 03/11/2023 with a chief complaint of facial swelling s/p nasotracheal intubation  Assessment:   Blood cultures growing Group B Streptococcus  Name of physician (or Provider) Contacted:  Dr. Warrick Parisian  Current antibiotics:  None  Changes to prescribed antibiotics recommended:  Due to penicillin allergy, start Ancef 2 g IV q24h.    Results for orders placed or performed during the hospital encounter of 03/11/23  Blood Culture ID Panel (Reflexed) (Collected: 03/11/2023  9:31 AM)  Result Value Ref Range   Enterococcus faecalis NOT DETECTED NOT DETECTED   Enterococcus Faecium NOT DETECTED NOT DETECTED   Listeria monocytogenes NOT DETECTED NOT DETECTED   Staphylococcus species NOT DETECTED NOT DETECTED   Staphylococcus aureus (BCID) NOT DETECTED NOT DETECTED   Staphylococcus epidermidis NOT DETECTED NOT DETECTED   Staphylococcus lugdunensis NOT DETECTED NOT DETECTED   Streptococcus species DETECTED (A) NOT DETECTED   Streptococcus agalactiae DETECTED (A) NOT DETECTED   Streptococcus pneumoniae NOT DETECTED NOT DETECTED   Streptococcus pyogenes NOT DETECTED NOT DETECTED   A.calcoaceticus-baumannii NOT DETECTED NOT DETECTED   Bacteroides fragilis NOT DETECTED NOT DETECTED   Enterobacterales NOT DETECTED NOT DETECTED   Enterobacter cloacae complex NOT DETECTED NOT DETECTED   Escherichia coli NOT DETECTED NOT DETECTED   Klebsiella aerogenes NOT DETECTED NOT DETECTED   Klebsiella oxytoca NOT DETECTED NOT DETECTED   Klebsiella pneumoniae NOT DETECTED NOT DETECTED   Proteus species NOT DETECTED NOT DETECTED   Salmonella species NOT DETECTED NOT DETECTED   Serratia marcescens NOT DETECTED NOT DETECTED   Haemophilus influenzae NOT DETECTED NOT DETECTED   Neisseria meningitidis NOT DETECTED NOT DETECTED   Pseudomonas  aeruginosa NOT DETECTED NOT DETECTED   Stenotrophomonas maltophilia NOT DETECTED NOT DETECTED   Candida albicans NOT DETECTED NOT DETECTED   Candida auris NOT DETECTED NOT DETECTED   Candida glabrata NOT DETECTED NOT DETECTED   Candida krusei NOT DETECTED NOT DETECTED   Candida parapsilosis NOT DETECTED NOT DETECTED   Candida tropicalis NOT DETECTED NOT DETECTED   Cryptococcus neoformans/gattii NOT DETECTED NOT DETECTED    [name] 03/11/2023  11:03 PM

## 2023-03-11 NOTE — Procedures (Signed)
Preop diagnosis: Lip edema, stridor Postop diagnosis: same, laryngeal edema Procedure: Transnasal fiberoptic laryngoscopy Surgeon: Jenne Pane Anesth: Topical with 4% lidocaine Compl: None Findings: Pharyngeal anatomy narrowed.  Epiglottis thickened.  Endolarynx without much movement or easily identified landmarks.  Airway narrowed. Description:  After discussing risks, benefits, and alternatives, the patient was placed in a seated position and the right nasal passage was sprayed with topical anesthetic.  The fiberoptic scope was passed through the right nasal passage to view the pharynx and larynx.  Findings are noted above.  The scope was then removed and he was returned to nursing care in stable condition.

## 2023-03-12 ENCOUNTER — Inpatient Hospital Stay (HOSPITAL_COMMUNITY): Payer: 59

## 2023-03-12 ENCOUNTER — Encounter (HOSPITAL_COMMUNITY): Payer: Self-pay | Admitting: Otolaryngology

## 2023-03-12 DIAGNOSIS — E162 Hypoglycemia, unspecified: Secondary | ICD-10-CM

## 2023-03-12 DIAGNOSIS — Z9911 Dependence on respirator [ventilator] status: Secondary | ICD-10-CM

## 2023-03-12 DIAGNOSIS — R7881 Bacteremia: Secondary | ICD-10-CM

## 2023-03-12 DIAGNOSIS — Z930 Tracheostomy status: Secondary | ICD-10-CM | POA: Diagnosis not present

## 2023-03-12 LAB — BASIC METABOLIC PANEL
Anion gap: 20 — ABNORMAL HIGH (ref 5–15)
BUN: 37 mg/dL — ABNORMAL HIGH (ref 8–23)
CO2: 23 mmol/L (ref 22–32)
Calcium: 8.7 mg/dL — ABNORMAL LOW (ref 8.9–10.3)
Chloride: 92 mmol/L — ABNORMAL LOW (ref 98–111)
Creatinine, Ser: 3.64 mg/dL — ABNORMAL HIGH (ref 0.44–1.00)
GFR, Estimated: 13 mL/min — ABNORMAL LOW (ref >60)
Glucose, Bld: 115 mg/dL — ABNORMAL HIGH (ref 70–99)
Potassium: 3.5 mmol/L (ref 3.5–5.1)
Sodium: 135 mmol/L (ref 135–145)

## 2023-03-12 LAB — MAGNESIUM
Magnesium: 1.9 mg/dL (ref 1.7–2.4)
Magnesium: 1.9 mg/dL (ref 1.7–2.4)

## 2023-03-12 LAB — ECHOCARDIOGRAM COMPLETE
AR max vel: 1.15 cm2
AV Peak grad: 5.4 mmHg
Ao pk vel: 1.17 m/s
Area-P 1/2: 6.54 cm2
Height: 62 in
MV VTI: 1.73 cm2
S' Lateral: 2.6 cm
Weight: 1365.09 oz

## 2023-03-12 LAB — GLUCOSE, CAPILLARY
Glucose-Capillary: 56 mg/dL — ABNORMAL LOW (ref 70–99)
Glucose-Capillary: 91 mg/dL (ref 70–99)
Glucose-Capillary: 30 mg/dL — CL (ref 70–99)
Glucose-Capillary: 59 mg/dL — ABNORMAL LOW (ref 70–99)
Glucose-Capillary: 83 mg/dL (ref 70–99)
Glucose-Capillary: 163 mg/dL — ABNORMAL HIGH (ref 70–99)
Glucose-Capillary: 122 mg/dL — ABNORMAL HIGH (ref 70–99)
Glucose-Capillary: 76 mg/dL (ref 70–99)
Glucose-Capillary: 116 mg/dL — ABNORMAL HIGH (ref 70–99)

## 2023-03-12 LAB — CULTURE, BLOOD (ROUTINE X 2)

## 2023-03-12 LAB — CBC
HCT: 38.1 % (ref 36.0–46.0)
Hemoglobin: 12.5 g/dL (ref 12.0–15.0)
MCH: 24.8 pg — ABNORMAL LOW (ref 26.0–34.0)
MCHC: 32.8 g/dL (ref 30.0–36.0)
MCV: 75.4 fL — ABNORMAL LOW (ref 80.0–100.0)
Platelets: 150 10*3/uL (ref 150–400)
RBC: 5.05 MIL/uL (ref 3.87–5.11)
RDW: 14.2 % (ref 11.5–15.5)
WBC: 25.3 10*3/uL — ABNORMAL HIGH (ref 4.0–10.5)
nRBC: 0 % (ref 0.0–0.2)

## 2023-03-12 LAB — PHOSPHORUS: Phosphorus: 5.7 mg/dL — ABNORMAL HIGH (ref 2.5–4.6)

## 2023-03-12 LAB — TRIGLYCERIDES: Triglycerides: 49 mg/dL (ref <150)

## 2023-03-12 MED ORDER — OSMOLITE 1.5 CAL PO LIQD
1000.0000 mL | ORAL | Status: DC
  Administered 2023-03-12 – 2023-03-16 (×5): 1000 mL
  Filled 2023-03-12 (×6): qty 1000

## 2023-03-12 MED ORDER — SODIUM CHLORIDE 0.9 % IV SOLN: INTRAVENOUS | Status: DC | PRN

## 2023-03-12 MED ORDER — THIAMINE HCL 100 MG/ML IJ SOLN
100.0000 mg | Freq: Every day | INTRAMUSCULAR | Status: DC
  Administered 2023-03-12 – 2023-03-14 (×3): 100 mg via INTRAVENOUS
  Filled 2023-03-12 (×4): qty 2

## 2023-03-12 MED ORDER — HYDRALAZINE HCL 20 MG/ML IJ SOLN
10.0000 mg | INTRAMUSCULAR | Status: DC | PRN
  Administered 2023-03-12 – 2023-03-17 (×4): 10 mg via INTRAVENOUS
  Filled 2023-03-12 (×4): qty 1

## 2023-03-12 MED ORDER — DEXTROSE 50 % IV SOLN
INTRAVENOUS | Status: AC
  Filled 2023-03-12: qty 50

## 2023-03-12 MED ORDER — RENA-VITE PO TABS
1.0000 | ORAL_TABLET | Freq: Every day | ORAL | Status: DC
  Administered 2023-03-12 – 2023-03-30 (×19): 1
  Filled 2023-03-12 (×20): qty 1

## 2023-03-12 MED ORDER — LABETALOL HCL 5 MG/ML IV SOLN
10.0000 mg | INTRAVENOUS | Status: DC | PRN
  Administered 2023-03-15: 10 mg via INTRAVENOUS
  Filled 2023-03-12: qty 4

## 2023-03-12 MED ORDER — CHLORHEXIDINE GLUCONATE CLOTH 2 % EX PADS
6.0000 | MEDICATED_PAD | Freq: Every day | CUTANEOUS | Status: DC
  Administered 2023-03-14 – 2023-03-15 (×2): 6 via TOPICAL

## 2023-03-12 MED ORDER — DEXTROSE 5 % IV SOLN: INTRAVENOUS | Status: DC

## 2023-03-12 MED ORDER — IOHEXOL 350 MG/ML SOLN
75.0000 mL | Freq: Once | INTRAVENOUS | Status: AC | PRN
  Administered 2023-03-12: 75 mL via INTRAVENOUS

## 2023-03-12 MED ORDER — CEFAZOLIN SODIUM-DEXTROSE 1-4 GM/50ML-% IV SOLN
1.0000 g | Freq: Every day | INTRAVENOUS | Status: AC
  Administered 2023-03-12 – 2023-03-19 (×8): 1 g via INTRAVENOUS
  Filled 2023-03-12 (×11): qty 50

## 2023-03-12 NOTE — Progress Notes (Signed)
Subjective: Remains intubated, sedated.  Objective: Vital signs in last 24 hours: Temp:  [96 F (35.6 C)-97.8 F (36.6 C)] 96.9 F (36.1 C) (06/19 1513) Pulse Rate:  [75-102] 100 (06/19 1606) Resp:  [16-24] 21 (06/19 1606) BP: (98-165)/(54-89) 98/76 (06/19 1606) SpO2:  [93 %-100 %] 100 % (06/19 1606) FiO2 (%):  [40 %] 40 % (06/19 1107) Weight:  [38.7 kg] 38.7 kg (06/19 0500) Wt Readings from Last 1 Encounters:  03/12/23 38.7 kg    Intake/Output from previous day: 06/18 0701 - 06/19 0700 In: 652.9 [I.V.:286.8; IV Piggyback:366.2] Out: 50 [Urine:50] Intake/Output this shift: Total I/O In: 510.7 [I.V.:510.7] Out: -   General appearance: alert, cooperative, and no distress Head: lips with less edema  Recent Labs    03/11/23 0909 03/11/23 1320 03/11/23 1617 03/12/23 0609  WBC 23.0*  --   --  25.3*  HGB 11.8*   < > 12.9 12.5  HCT 37.7   < > 38.0 38.1  PLT 186  --   --  150   < > = values in this interval not displayed.    Recent Labs    03/11/23 0909 03/11/23 1320 03/11/23 1617 03/12/23 0609  NA 135   < > 132* 135  K 3.6   < > 3.5 3.5  CL 95*  --   --  92*  CO2 27  --   --  23  GLUCOSE 151*  --   --  115*  BUN 24*  --   --  37*  CREATININE 3.23*  --   --  3.64*  CALCIUM 8.8*  --   --  8.7*   < > = values in this interval not displayed.    Medications: I have reviewed the patient's current medications.  Assessment/Plan: Lip and laryngeal edema and inspiratory stridor  Her lip swelling is improving.  I discussed her progress with her daughter and the CCM provider.  If lip swelling continues to improve and she exhibits a good cuff leak, may consider extubation tomorrow.   LOS: 1 day   Christia Reading 03/12/2023, 4:08 PM

## 2023-03-12 NOTE — Progress Notes (Signed)
Echocardiogram 2D Echocardiogram has been performed.  Susan Evans 03/12/2023, 12:33 PM

## 2023-03-12 NOTE — Progress Notes (Addendum)
Initial Nutrition Assessment  DOCUMENTATION CODES:   Underweight  INTERVENTION:   If enteral access obtained and TF initiated, recommend: Starting Osmolite 1.5 at 20ml and advance in 6 hours as tolerated to goal rate of 46ml/hr ( per day) 60 ml ProSource TF20 once daily   Provides 1340kcal, 73g protein, free water  Renal MVI with minerals per tube  NUTRITION DIAGNOSIS:   Inadequate oral intake related to acute illness as evidenced by NPO status.  GOAL:   Patient will meet greater than or equal to 90% of their needs  MONITOR:   Diet advancement, Vent status, Labs, Weight trends, I & O's, Skin  REASON FOR ASSESSMENT:   Ventilator, Consult Enteral/tube feeding initiation and management  ASSESSMENT:   Pt admitted with airway compromise d/t lip swelling during dialysis. PMH significant for ESRD on HD, DM, GERD, remote laryngeal cancer s/p surgery and XRT (2011).   6/18 - admitted, nasal intubation d/t glottic edema 6/19 - cortrak placed (tip gastric), TF initiated  Per CCM, TF if able to establish enteral access. Noted possible extubation tomorrow.   Patient is currently intubated on ventilator support MV: 8.4 L/min Temp (24hrs), Avg:97.2 F (36.2 C), Min:96 F (35.6 C), Max:97.8 F (36.6 C)  Propofol: 12.3 ml/hr (provides 325 kcal/d)  EDW per review of Care everywhere- 36 kg Current weight 38.7 kg  Medications: decadron, colace, SSI 0-6 units q4h, protonix, miralax, thiamine  Drips:  NaCl @ 49ml/hr D5 @ 77ml/hr  Labs: BUN 37, Cr 3.64, anion gap 20, GFR 13, CBG's 30-91 x12 hours  NUTRITION - FOCUSED PHYSICAL EXAM: RD working remotely. Given underweight status, suspect a degree of malnutrition is present. Will assess on follow up.   Diet Order:   Diet Order             Diet NPO time specified  Diet effective now                   EDUCATION NEEDS:   No education needs have been identified at this time  Skin:  Skin Assessment:  Reviewed RN Assessment  Last BM:  unknown  Height:   Ht Readings from Last 1 Encounters:  03/11/23 5\' 2"  (1.575 m)    Weight:   Wt Readings from Last 1 Encounters:  03/12/23 38.7 kg   BMI:  Body mass index is 15.6 kg/m.  Estimated Nutritional Needs:   Kcal:  1300-1500  Protein:  65-75g  Fluid:  1L + UOP  Drusilla Kanner, RDN, LDN Clinical Nutrition

## 2023-03-12 NOTE — Progress Notes (Signed)
Patient transported to CT and back without complication. 

## 2023-03-12 NOTE — Consult Note (Signed)
Reason for Consult: To manage dialysis and dialysis related needs Referring Physician: Dr Chestine Spore  Susan Evans is an 67 y.o. female.  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  History obtained from chart, other providers, and dtr.    Pt started having some lip swelling on Monday.  On Tuesday when she went to dialysis started having difficulty breathing and was sent from HD to the ED via EMS.  Noted to have angioedema-like symptoms and stridor.  Was taken to the OR where she was emergently nasally intubated by ENT.  Blood cultures resulted 2/2 strep agalactiae.  In this setting we are asked to see.  Similar presentation occurred about a year ago- no clear cause found, didn't require trach.  No offending meds/ etc.  Of note, she was here with an infected L AVG--> doesn't look like there is culture data.  Pt adherent to rx and typically does not have any issues at HD.  Last rx Tuesday, shortened d/t airway difficulty.    Dialyzes at [facility] TTS   Past Medical History:  Diagnosis Date   AKI (acute kidney injury) (HCC) 08/12/2019   Anaphylactic shock, unspecified, initial encounter 05/09/2020   Chronic kidney disease    Dialysis T/Th/Sat- [address]   CKD stage G1/A3, GFR > 90 and albumin creatinine ratio >300 mg/g 07/08/2016   Nephrotic range proteinuria   Diabetes mellitus    type 2    Dyspnea    "when I have too much fluid."   Ganglion cyst    right wrist   GERD (gastroesophageal reflux disease)    History of radiation therapy 02/27/10 -04/13/10   left base of tongue, bilat neck   Hyperlipidemia    Hypertension    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/21/2021   Iron deficiency anemia    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    Menopause    Thyroid disease    Hyperthyroidism   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/08/2020   Tobacco abuse    PT HAS QUIT    Past Surgical History:  Procedure Laterality Date   AV FISTULA PLACEMENT Left 06/02/2020   Procedure: INSERTION OF ARTERIOVENOUS (AV) GORE-TEX GRAFT ARM LEFT;  Surgeon: Cephus Shelling, MD;  Location: [facility] OR;  Service: Vascular;  Laterality: Left;   AV FISTULA PLACEMENT Right 12/27/2020   Procedure: RIGHT ARM CEPHALIC ARTERIOVENOUS FISTULA CREATION;  Surgeon: Maeola Harman, MD;  Location: [facility] OR;  Service: Vascular;  Laterality: Right;   AVGG REMOVAL Left 12/21/2021   Procedure: EXCISION OF LEFT ARM ARTERIOVENOUS GORETEX GRAFT (AVGG);  Surgeon: Maeola Harman, MD;  Location: [facility] OR;  Service: Vascular;  Laterality: Left;   IR FLUORO GUIDE CV LINE LEFT  05/15/2020   IR REMOVAL TUN CV CATH W/O FL  05/15/2020   IR REMOVAL TUN CV CATH W/O FL  06/15/2021   IR US GUIDE VASC ACCESS LEFT  05/15/2020   NECK SURGERY     REDUCTION MAMMAPLASTY     TRACHEOSTOMY TUBE PLACEMENT N/A 03/11/2023   Procedure: AWAKE INTUBATION;  Surgeon: Christia Reading, MD;  Location: [facility] OR;  Service: ENT;  Laterality: N/A;    Family History  Problem Relation Age  of Onset   Stroke Father    Cancer Father        unknown ca   Hypertension Mother    Diabetes Mother    Chronic Renal Failure Sister    Chronic Renal Failure Brother    Cancer Paternal Aunt        colon ca   Breast cancer Neg Hx     Social History:  reports that she quit smoking about 23 months ago. Her smoking use included cigarettes. She smoked an average of .3 packs per day. She has never been exposed to tobacco smoke. She has never used smokeless tobacco. She reports that she does not drink alcohol and does not use drugs.  Allergies:  Allergies  Allergen Reactions   Hydrochlorothiazide Itching and Other (See Comments)    Hyponatremia    Penicillins Other (See  Comments)    "Takes out all of her hair"    Medications: Scheduled:  Chlorhexidine Gluconate Cloth  6 each Topical Daily   cloNIDine  0.2 mg Transdermal Weekly   dexamethasone (DECADRON) injection  4 mg Intravenous Q6H   docusate  100 mg Per Tube BID   heparin  5,000 Units Subcutaneous Q8H   hydrALAZINE  50 mg Oral BID   insulin aspart  0-6 Units Subcutaneous Q4H   mouth rinse  15 mL Mouth Rinse Q2H   pantoprazole (PROTONIX) IV  40 mg Intravenous QHS   polyethylene glycol  17 g Per Tube Daily   thiamine (VITAMIN B1) injection  100 mg Intravenous Daily     Results for orders placed or performed during the hospital encounter of 03/11/23 (from the past 48 hour(s))  Lactic acid, plasma     Status: None   Collection Time: 03/11/23  9:09 AM  Result Value Ref Range   Lactic Acid, Venous 1.8 0.5 - 1.9 mmol/L    Comment: Performed at [facility] Lab, [address], [place], [place] [address]  Comprehensive metabolic panel     Status: Abnormal   Collection Time: 03/11/23  9:09 AM  Result Value Ref Range   Sodium 135 135 - 145 mmol/L   Potassium 3.6 3.5 - 5.1 mmol/L   Chloride 95 (L) 98 - 111 mmol/L   CO2 27 22 - 32 mmol/L   Glucose, Bld 151 (H) 70 - 99 mg/dL    Comment: Glucose reference range applies only to samples taken after fasting for at least 8 hours.   BUN 24 (H) 8 - 23 mg/dL   Creatinine, Ser 6.04 (H) 0.44 - 1.00 mg/dL   Calcium 8.8 (L) 8.9 - 10.3 mg/dL   Total Protein 6.8 6.5 - 8.1 g/dL   Albumin 3.3 (L) 3.5 - 5.0 g/dL   AST 56 (H) 15 - 41 U/L   ALT 45 (H) 0 - 44 U/L   Alkaline Phosphatase 160 (H) 38 - 126 U/L   Total Bilirubin 0.5 0.3 - 1.2 mg/dL   GFR, Estimated 15 (L) >60 mL/min    Comment: (NOTE) Calculated using the CKD-EPI Creatinine Equation (2021)    Anion gap 13 5 - 15    Comment: Performed at [facility] Lab, [address], [place], [place] [address]  CBC with Differential     Status: Abnormal   Collection Time: 03/11/23  9:09 AM  Result Value Ref  Range   WBC 23.0 (H) 4.0 - 10.5 K/uL   RBC 4.80 3.87 - 5.11 MIL/uL   Hemoglobin 11.8 (L) 12.0 - 15.0 g/dL  HCT 37.7 36.0 - 46.0 %   MCV 78.5 (L) 80.0 - 100.0 fL   MCH 24.6 (L) 26.0 - 34.0 pg   MCHC 31.3 30.0 - 36.0 g/dL   RDW 02.7 25.3 - 66.4 %   Platelets 186 150 - 400 K/uL    Comment: REPEATED TO VERIFY   nRBC 0.0 0.0 - 0.2 %   Neutrophils Relative % 92 %   Neutro Abs 21.3 (H) 1.7 - 7.7 K/uL   Lymphocytes Relative 1 %   Lymphs Abs 0.3 (L) 0.7 - 4.0 K/uL   Monocytes Relative 6 %   Monocytes Absolute 1.3 (H) 0.1 - 1.0 K/uL   Eosinophils Relative 0 %   Eosinophils Absolute 0.0 0.0 - 0.5 K/uL   Basophils Relative 0 %   Basophils Absolute 0.1 0.0 - 0.1 K/uL   Immature Granulocytes 1 %   Abs Immature Granulocytes 0.11 (H) 0.00 - 0.07 K/uL    Comment: Performed at [facility] Lab, [address], [place], [place] [address]  Protime-INR     Status: None   Collection Time: 03/11/23  9:09 AM  Result Value Ref Range   Prothrombin Time 12.9 11.4 - 15.2 seconds   INR 1.0 0.8 - 1.2    Comment: (NOTE) INR goal varies based on device and disease states. Performed at [facility], [address], [place], [place] [address]   Troponin I (High Sensitivity)     Status: None   Collection Time: 03/11/23  9:09 AM  Result Value Ref Range   Troponin I (High Sensitivity) 15 <18 ng/L    Comment: (NOTE) Elevated high sensitivity troponin I (hsTnI) values and significant  changes across serial measurements may suggest ACS but many other  chronic and acute conditions are known to elevate hsTnI results.  Refer to the "Links" section for chest pain algorithms and additional  guidance. Performed at [facility] Lab, [address], [place], [place] [address]   Magnesium     Status: None   Collection Time: 03/11/23  9:09 AM  Result Value Ref Range   Magnesium 1.8 1.7 - 2.4 mg/dL    Comment: Performed at [facility] Lab, [address] [place], [place] [address]  D-dimer,  quantitative     Status: Abnormal   Collection Time: 03/11/23  9:09 AM  Result Value Ref Range   D-Dimer, Quant 0.54 (H) 0.00 - 0.50 ug/mL-FEU    Comment: (NOTE) At the manufacturer cut-off value of 0.5 g/mL FEU, this assay has a negative predictive value of 95-100%.This assay is intended for use in conjunction with a clinical pretest probability (PTP) assessment model to exclude pulmonary embolism (PE) and deep venous thrombosis (DVT) in outpatients suspected of PE or DVT. Results should be correlated with clinical presentation. Performed at [facility], [address] [place], [place] [address]   Blood Culture (routine x 2)     Status: Abnormal (Preliminary result)   Collection Time: 03/11/23  9:25 AM   Specimen: BLOOD LEFT FOREARM  Result Value Ref Range   Specimen Description BLOOD LEFT FOREARM    Special Requests      BOTTLES DRAWN AEROBIC AND ANAEROBIC Blood Culture results may not be optimal due to an inadequate volume of blood received in culture bottles   Culture  Setup Time      GRAM POSITIVE COCCI IN BOTH AEROBIC AND ANAEROBIC BOTTLES CRITICAL VALUE NOTED.  VALUE IS CONSISTENT WITH PREVIOUSLY REPORTED AND CALLED VALUE. Performed  at [facility], [address] [place], [place] [address]    Culture GROUP B STREP(S.AGALACTIAE)ISOLATED (A)    Report Status PENDING   Blood Culture (routine x 2)     Status: Abnormal (Preliminary result)   Collection Time: 03/11/23  9:31 AM   Specimen: BLOOD  Result Value Ref Range   Specimen Description BLOOD LEFT ANTECUBITAL    Special Requests      BOTTLES DRAWN AEROBIC AND ANAEROBIC Blood Culture adequate volume   Culture  Setup Time      GRAM POSITIVE COCCI IN BOTH AEROBIC AND ANAEROBIC BOTTLES CRITICAL RESULT CALLED TO, READ BACK BY AND VERIFIED WITH: PHARMD G. ABBOTT 03/11/23 @ 2257 BY AB    Culture (A)     GROUP B STREP(S.AGALACTIAE)ISOLATED SUSCEPTIBILITIES TO FOLLOW Performed at [facility] Lab, [address], [place], [place] [address]    Report Status PENDING   Blood Culture ID Panel (Reflexed)     Status: Abnormal   Collection Time: 03/11/23  9:31 AM  Result Value Ref Range   Enterococcus faecalis NOT DETECTED NOT DETECTED   Enterococcus Faecium NOT DETECTED NOT DETECTED   Listeria monocytogenes NOT DETECTED NOT DETECTED   Staphylococcus species NOT DETECTED NOT DETECTED   Staphylococcus aureus (BCID) NOT DETECTED NOT DETECTED   Staphylococcus epidermidis NOT DETECTED NOT DETECTED   Staphylococcus lugdunensis NOT DETECTED NOT DETECTED   Streptococcus species DETECTED (A) NOT DETECTED    Comment: CRITICAL RESULT CALLED TO, READ BACK BY AND VERIFIED WITH: PHARMD G. ABBOTT 03/11/23 @ 2257 BY AB    Streptococcus agalactiae DETECTED (A) NOT DETECTED    Comment: CRITICAL RESULT CALLED TO, READ BACK BY AND VERIFIED WITH: PHARMD G. ABBOTT 03/11/23 @ 2257 BY AB    Streptococcus pneumoniae NOT DETECTED NOT DETECTED   Streptococcus pyogenes NOT DETECTED NOT DETECTED   A.calcoaceticus-baumannii NOT DETECTED NOT DETECTED   Bacteroides fragilis NOT DETECTED NOT DETECTED   Enterobacterales NOT DETECTED NOT DETECTED   Enterobacter cloacae complex NOT DETECTED NOT DETECTED   Escherichia coli NOT DETECTED NOT DETECTED   Klebsiella aerogenes NOT DETECTED NOT DETECTED   Klebsiella oxytoca NOT DETECTED NOT DETECTED   Klebsiella pneumoniae NOT DETECTED NOT DETECTED   Proteus species NOT DETECTED NOT DETECTED   Salmonella species NOT DETECTED NOT DETECTED   Serratia marcescens NOT DETECTED NOT DETECTED   Haemophilus influenzae NOT DETECTED NOT DETECTED   Neisseria meningitidis NOT DETECTED NOT DETECTED   Pseudomonas aeruginosa NOT DETECTED NOT DETECTED   Stenotrophomonas maltophilia NOT DETECTED NOT DETECTED   Candida albicans NOT DETECTED NOT DETECTED   Candida auris NOT DETECTED NOT DETECTED   Candida glabrata NOT DETECTED NOT DETECTED   Candida krusei NOT DETECTED NOT DETECTED   Candida  parapsilosis NOT DETECTED NOT DETECTED   Candida tropicalis NOT DETECTED NOT DETECTED   Cryptococcus neoformans/gattii NOT DETECTED NOT DETECTED    Comment: Performed at [facility], [address] [place], [place] [address]  Troponin I (High Sensitivity)     Status: Abnormal   Collection Time: 03/11/23 11:44 AM  Result Value Ref Range   Troponin I (High Sensitivity) 19 (H) <18 ng/L    Comment: (NOTE) Elevated high sensitivity troponin I (hsTnI) values and significant  changes across serial measurements may suggest ACS but many other  chronic and acute conditions are known to elevate hsTnI results.  Refer to the "Links" section for chest pain algorithms and additional  guidance. Performed at [facility] Lab,  [address], [place], [place] [address]   Lactic acid, plasma     Status: None   Collection Time: 03/11/23 11:45 AM  Result Value Ref Range   Lactic Acid, Venous 1.6 0.5 - 1.9 mmol/L    Comment: Performed at [facility], [address], [place], [place] [address]  I-Stat venous blood gas, [facility], [facility], [facility])     Status: Abnormal   Collection Time: 03/11/23  1:20 PM  Result Value Ref Range   pH, Ven 7.368 7.25 - 7.43   pCO2, Ven 34.0 (L) 44 - 60 mmHg   pO2, Ven 110 (H) 32 - 45 mmHg   Bicarbonate 19.6 (L) 20.0 - 28.0 mmol/L   TCO2 21 (L) 22 - 32 mmol/L   O2 Saturation 98 %   Acid-base deficit 5.0 (H) 0.0 - 2.0 mmol/L   Sodium 132 (L) 135 - 145 mmol/L   Potassium 3.8 3.5 - 5.1 mmol/L   Calcium, Ion 1.00 (L) 1.15 - 1.40 mmol/L   HCT 26.0 (L) 36.0 - 46.0 %   Hemoglobin 8.8 (L) 12.0 - 15.0 g/dL   Sample type VENOUS   Glucose, capillary     Status: Abnormal   Collection Time: 03/11/23  3:09 PM  Result Value Ref Range   Glucose-Capillary 224 (H) 70 - 99 mg/dL    Comment: Glucose reference range applies only to samples taken after fasting for at least 8 hours.  MRSA Next Gen by PCR, Nasal     Status: None   Collection Time: 03/11/23  3:10 PM   Specimen:  Nasal Mucosa; Nasal Swab  Result Value Ref Range   MRSA by PCR Next Gen NOT DETECTED NOT DETECTED    Comment: (NOTE) The GeneXpert MRSA Assay (FDA approved for NASAL specimens only), is one component of a comprehensive MRSA colonization surveillance program. It is not intended to diagnose MRSA infection nor to guide or monitor treatment for MRSA infections. Test performance is not FDA approved in patients less than 39 years old. Performed at [facility], [address] [place], [place] [address]   I-STAT 7, (LYTES, BLD GAS, ICA, H+H)     Status: Abnormal   Collection Time: 03/11/23  4:17 PM  Result Value Ref Range   pH, Arterial 7.365 7.35 - 7.45   pCO2 arterial 47.7 32 - 48 mmHg   pO2, Arterial 141 (H) 83 - 108 mmHg   Bicarbonate 27.3 20.0 - 28.0 mmol/L   TCO2 29 22 - 32 mmol/L   O2 Saturation 99 %   Acid-Base Excess 1.0 0.0 - 2.0 mmol/L   Sodium 132 (L) 135 - 145 mmol/L   Potassium 3.5 3.5 - 5.1 mmol/L   Calcium, Ion 1.10 (L) 1.15 - 1.40 mmol/L   HCT 38.0 36.0 - 46.0 %   Hemoglobin 12.9 12.0 - 15.0 g/dL   Collection site RADIAL, ALLEN'S TEST ACCEPTABLE    Drawn by RT    Sample type ARTERIAL   Urinalysis, w/ Reflex to Culture (Infection Suspected) -Urine, Clean Catch     Status: Abnormal   Collection Time: 03/11/23  4:36 PM  Result Value Ref Range   Specimen Source URINE, CLEAN CATCH    Color, Urine YELLOW YELLOW   APPearance HAZY (A) CLEAR   Specific Gravity, Urine 1.015 1.005 - 1.030   pH 5.0 5.0 - 8.0   Glucose, UA 150 (A) NEGATIVE mg/dL   Hgb urine dipstick MODERATE (A) NEGATIVE   Bilirubin Urine SMALL (A) NEGATIVE  Ketones, ur NEGATIVE NEGATIVE mg/dL   Protein, ur >=409 (A) NEGATIVE mg/dL   Nitrite NEGATIVE NEGATIVE   Leukocytes,Ua NEGATIVE NEGATIVE   RBC / HPF 0-5 0 - 5 RBC/hpf   WBC, UA 0-5 0 - 5 WBC/hpf    Comment:        Reflex urine culture not performed if WBC <=10, OR if Squamous epithelial cells >5. If Squamous epithelial cells >5 suggest  recollection.    Bacteria, UA RARE (A) NONE SEEN   Squamous Epithelial / HPF 0-5 0 - 5 /HPF    Comment: Performed at [facility], [address], [place], [place] [address]  Brain natriuretic peptide     Status: Abnormal   Collection Time: 03/11/23  4:43 PM  Result Value Ref Range   B Natriuretic Peptide 165.6 (H) 0.0 - 100.0 pg/mL    Comment: Performed at [facility], [address], [place], [place] [address]  Glucose, capillary     Status: Abnormal   Collection Time: 03/11/23  7:52 PM  Result Value Ref Range   Glucose-Capillary 156 (H) 70 - 99 mg/dL    Comment: Glucose reference range applies only to samples taken after fasting for at least 8 hours.  Glucose, capillary     Status: Abnormal   Collection Time: 03/11/23 11:21 PM  Result Value Ref Range   Glucose-Capillary 140 (H) 70 - 99 mg/dL    Comment: Glucose reference range applies only to samples taken after fasting for at least 8 hours.  Glucose, capillary     Status: Abnormal   Collection Time: 03/12/23  3:30 AM  Result Value Ref Range   Glucose-Capillary 56 (L) 70 - 99 mg/dL    Comment: Glucose reference range applies only to samples taken after fasting for at least 8 hours.  Glucose, capillary     Status: None   Collection Time: 03/12/23  3:38 AM  Result Value Ref Range   Glucose-Capillary 91 70 - 99 mg/dL    Comment: Glucose reference range applies only to samples taken after fasting for at least 8 hours.  CBC     Status: Abnormal   Collection Time: 03/12/23  6:09 AM  Result Value Ref Range   WBC 25.3 (H) 4.0 - 10.5 K/uL   RBC 5.05 3.87 - 5.11 MIL/uL   Hemoglobin 12.5 12.0 - 15.0 g/dL   HCT 56.2 13.0 - 86.5 %   MCV 75.4 (L) 80.0 - 100.0 fL   MCH 24.8 (L) 26.0 - 34.0 pg   MCHC 32.8 30.0 - 36.0 g/dL   RDW 78.4 69.6 - 29.5 %   Platelets 150 150 - 400 K/uL    Comment: REPEATED TO VERIFY   nRBC 0.0 0.0 - 0.2 %    Comment: Performed at [facility], [address], [place], [place] [address]   Basic metabolic panel     Status: Abnormal   Collection Time: 03/12/23  6:09 AM  Result Value Ref Range   Sodium 135 135 - 145 mmol/L   Potassium 3.5 3.5 - 5.1 mmol/L   Chloride 92 (L) 98 - 111 mmol/L   CO2 23 22 - 32 mmol/L   Glucose, Bld 115 (H) 70 - 99 mg/dL    Comment: Glucose reference range applies only to samples taken after fasting for at least 8 hours.   BUN 37 (H) 8 - 23 mg/dL   Creatinine, Ser 2.44 (H) 0.44 - 1.00 mg/dL   Calcium 8.7 (L) 8.9 -  10.3 mg/dL   GFR, Estimated 13 (L) >60 mL/min    Comment: (NOTE) Calculated using the CKD-EPI Creatinine Equation (2021)    Anion gap 20 (H) 5 - 15    Comment: Performed at [facility] Lab, [address], [place], [place] [address]  Magnesium     Status: None   Collection Time: 03/12/23  6:09 AM  Result Value Ref Range   Magnesium 1.9 1.7 - 2.4 mg/dL    Comment: Performed at [facility], [address] [place], [place] [address]  Triglycerides     Status: None   Collection Time: 03/12/23  6:09 AM  Result Value Ref Range   Triglycerides 49 <150 mg/dL    Comment: Performed at [facility], [address], [place], [place] [address]  Glucose, capillary     Status: Abnormal   Collection Time: 03/12/23  7:30 AM  Result Value Ref Range   Glucose-Capillary 59 (L) 70 - 99 mg/dL    Comment: Glucose reference range applies only to samples taken after fasting for at least 8 hours.  Glucose, capillary     Status: Abnormal   Collection Time: 03/12/23  7:32 AM  Result Value Ref Range   Glucose-Capillary 30 (LL) 70 - 99 mg/dL    Comment: Glucose reference range applies only to samples taken after fasting for at least 8 hours.   Comment 1 Notify RN   Glucose, capillary     Status: None   Collection Time: 03/12/23  7:38 AM  Result Value Ref Range   Glucose-Capillary 83 70 - 99 mg/dL    Comment: Glucose reference range applies only to samples taken after fasting for at least 8 hours.    DG Chest Port 1 View  Result  Date: 03/12/2023 CLINICAL DATA:  Endotracheal tube EXAM: PORTABLE CHEST 1 VIEW COMPARISON:  03/11/2023 FINDINGS: The heart size and mediastinal contours are within normal limits. Endotracheal tube is positioned with tip above the carina, unchanged position. Unchanged mild diffuse bilateral interstitial opacity. The visualized skeletal structures are unremarkable. IMPRESSION: 1. Endotracheal tube is positioned with tip above the carina, unchanged position. 2. Unchanged mild diffuse bilateral interstitial opacity, consistent with edema or infection. Electronically Signed   By: Jearld Lesch M.D.   On: 03/12/2023 08:57   DG CHEST PORT 1 VIEW  Result Date: 03/11/2023 CLINICAL DATA:  Intubation EXAM: PORTABLE CHEST 1 VIEW COMPARISON:  Chest x-ray 03/11/2023 FINDINGS: Endotracheal tube tip is 1 cm above the carina. The lungs are clear. There is no pleural effusion or pneumothorax. No acute fractures are seen. IMPRESSION: Endotracheal tube tip is 1 cm above the carina. Electronically Signed   By: Darliss Cheney M.D.   On: 03/11/2023 17:03   DG Chest Port 1 View  Result Date: 03/11/2023 CLINICAL DATA:  Sepsis. EXAM: PORTABLE CHEST 1 VIEW COMPARISON:  December 21, 2021. FINDINGS: The heart size and mediastinal contours are within normal limits. Both lungs are clear. The visualized skeletal structures are unremarkable. IMPRESSION: No active disease. Electronically Signed   By: Lupita Raider M.D.   On: 03/11/2023 10:27    ROS: unobtainable d/t intubation Blood pressure 138/64, pulse 97, temperature 97.6 F (36.4 C), temperature source Axillary, resp. rate 17, height 5\' 2"  (1.575 m), weight 38.7 kg, SpO2 100 %. GEN  ill-appearing intubated and sedated HEENT eyes closed, top lip better, bottom lip still with + angioedema NECK no JVD PULM mech coarse bilaterally CV RRR ABD soft EXT some nonpitting  edema upper extremities NEURO intubated and sedated SKIN no rashes ACCESS: R AVG + T/B  Assessment/Plan: 1  Angioedema: occurred 12/2021 as well.  Both times this has happened have been in the setting of infection but that's the only common link I can identify- and I am not sure that it was the same organism anyway.  D/w PCCM- they will order CT maxillofacial with contrast (fine in ESRD) to look for oropharyngeal nidus of infection in the setting of S agalactiae bacteremia.  Maybe check C1 esterase.  Getting dex 2.  S agalactiae bacteremia: as above in #1 3 ESRD: TTS [facility].  Will provide HD tomorrow on schedule.   4 Hypertension: reasonably controlled.  UF as tolerated tomorrow 5. 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 Metabolic Bone Disease:  binders/ vits when eating 7.  Dispo: in ICU for now  Bufford Buttner 03/12/2023, 11:21 AM

## 2023-03-12 NOTE — Progress Notes (Signed)
eLink Physician-Brief Progress Note Patient Name: Susan Evans DOB: Oct 11, 1955 MRN: [id number]   Date of Service  03/12/2023  HPI/Events of Note  BP 161/67, currently no oral access for medications.  eICU Interventions  PRN iv Hydralazine ordered.        Susan Evans 03/12/2023, 12:47 AM

## 2023-03-12 NOTE — Procedures (Signed)
Cortrak  Tube Type:  Cortrak - 43 inches Tube Location:  Left nare Initial Placement:  Stomach Secured by: Tape Technique Used to Measure Tube Placement:  Marking at nare/corner of mouth Cortrak Secured At:  63 cm   Cortrak Tube Team Note:  Consult received to place a Cortrak feeding tube.   X-ray is required, abdominal x-ray has been ordered by the Cortrak team. Please confirm tube placement before using the Cortrak tube.   If the tube becomes dislodged please keep the tube and contact the Cortrak team at [email or web address] for replacement.  If after hours and replacement cannot be delayed, place a NG tube and confirm placement with an abdominal x-ray.    Betsey Holiday MS, RD, LDN Please refer to [facility] for RD and/or RD on-call/weekend/after hours pager

## 2023-03-12 NOTE — Progress Notes (Signed)
Subglottic edema/ compromised airway- unclear etiology. S/p fiberoptic nasal intubation by ENT in OR 6/18.  TOC following.

## 2023-03-12 NOTE — Progress Notes (Signed)
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 She also developed SOB and hoarse voice and ENT was consulted. She was ultimately taken to OR and nasally intubated by ENT.  Post op she was brought to ICU and PCCM consulted for ICU mgmt.  Pt denies recent medication changes or new medications  She had similar event ~1 year ago requiring intubation. Janina Mayo was considered at the time but ultimately not needed and no clear cause identified.   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   Significant Hospital Events: Including procedures, antibiotic start and stop dates in addition to other pertinent events   6/18 admitted, OR for nasal intubation due to glottic edema  Interim History / Subjective:  Remains intubated, sedated.  Objective   Blood pressure 126/60, pulse 76, temperature 97.6 F (36.4 C),  temperature source Axillary, resp. rate 20, height 5\' 2"  (1.575 m), weight 38.7 kg, SpO2 100 %.    Vent Mode: PRVC FiO2 (%):  [40 %-100 %] 40 % Set Rate:  [18 bmp-20 bmp] 20 bmp Vt Set:  [400 mL] 400 mL PEEP:  [5 cmH20] 5 cmH20 Plateau Pressure:  [16 cmH20-17 cmH20] 17 cmH20   Intake/Output Summary (Last 24 hours) at 03/12/2023 0721 Last data filed at 03/12/2023 0600 Gross per 24 hour  Intake 652.92 ml  Output 50 ml  Net 602.92 ml    Filed Weights   03/11/23 1520 03/12/23 0500  Weight: 41 kg 38.7 kg    Examination: General: critically ill appearing woman nasally intubated, sedated HENT: [place]/AT, large edematous lower lip Lungs: Breathing comfortably on mechanical ventilation, CTAB Cardiovascular: S1-S2, regular rate and rhythm Abdomen: Soft, nontender Extremities: No peripheral edema, minimal muscle mass Neuro: RASS -5  BUN 37 Cr 3.64 WBC 25.3 H/H 12.5/38.1  Blood cultures> strep agalactiae 4/4  Resolved Hospital Problem list     Assessment & Plan:  Subglottic edema/ compromised airway- unclear etiology. S/p fiberoptic nasal intubation by ENT in OR 6/18. Of note had similar previous episode ~1 year ago. With bacteremia question epiglottitis. 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  Post op vent management  -LTVV - VAP prevention protocol - PAD protocol for sedation - Daily SAT SBT once appropriate.  Discussed with ENT -- considering extubation tomorrow depending on cuff leak.  Check for cuff leak today.   -steroids -Needs allergy input on d/c and rx for epi pen. -check C1 esterase level to r/o hereditary angioedema  Step bacteremia -echo, repeat blood cultures -cefazolin  ESRD  -HD per nephrology -strict I/O  DM with  hyperglycemia; A1c 9 -SSI PRN -hold insulin glargine due to hypoglycemia -goal BG 140-180 -d5w until able to get nutrition or hypoglycemia recovers -needs diabetes education prior to discharge  HTN, well controlled on  propofol -hydralazine and labetalol PRN -unable to take oral agents currently  Chronic moderate protein energy malnutrition -TF if able to establish enteral access -IV thiamin with dextrose    Best Practice (right click and "Reselect all SmartList Selections" daily)   Diet/type: NPO DVT prophylaxis: prophylactic heparin  GI prophylaxis: PPI Lines: N/A Foley:  N/A Code Status:  full code Last date of multidisciplinary goals of care discussion []   Labs   CBC: Recent Labs  Lab 03/11/23 0909 03/11/23 1320 03/11/23 1617 03/12/23 0609  WBC 23.0*  --   --  25.3*  NEUTROABS 21.3*  --   --   --   HGB 11.8* 8.8* 12.9 12.5  HCT 37.7 26.0* 38.0 38.1  MCV 78.5*  --   --  75.4*  PLT 186  --   --  150     Basic Metabolic Panel: Recent Labs  Lab 03/11/23 0909 03/11/23 1320 03/11/23 1617 03/12/23 0609  NA 135 132* 132* 135  K 3.6 3.8 3.5 3.5  CL 95*  --   --  92*  CO2 27  --   --  23  GLUCOSE 151*  --   --  115*  BUN 24*  --   --  37*  CREATININE 3.23*  --   --  3.64*  CALCIUM 8.8*  --   --  8.7*  MG 1.8  --   --  1.9    GFR: Estimated Creatinine Clearance: 9.3 mL/min (A) (by C-G formula based on SCr of 3.64 mg/dL (H)). Recent Labs  Lab 03/11/23 0909 03/11/23 1145 03/12/23 0609  WBC 23.0*  --  25.3*  LATICACIDVEN 1.8 1.6  --      Liver Function Tests: Recent Labs  Lab 03/11/23 0909  AST 56*  ALT 45*  ALKPHOS 160*  BILITOT 0.5  PROT 6.8  ALBUMIN 3.3*       Critical care time:       This patient is critically ill with multiple organ system failure which requires frequent high complexity decision making, assessment, support, evaluation, and titration of therapies. This was completed through the application of advanced monitoring technologies and extensive interpretation of multiple databases. During this encounter critical care time was devoted to patient care services described in this note for 36 minutes.  Steffanie Dunn, DO 03/12/23 8:30  AM [facility]  For contact information, see Amion. If no response to pager, please call PCCM consult pager. After hours, 7PM- 7AM, please call Elink.

## 2023-03-13 ENCOUNTER — Inpatient Hospital Stay (HOSPITAL_COMMUNITY): Payer: 59

## 2023-03-13 ENCOUNTER — Other Ambulatory Visit (HOSPITAL_COMMUNITY): Payer: 59

## 2023-03-13 DIAGNOSIS — R7881 Bacteremia: Secondary | ICD-10-CM

## 2023-03-13 DIAGNOSIS — J384 Edema of larynx: Secondary | ICD-10-CM | POA: Diagnosis not present

## 2023-03-13 DIAGNOSIS — N186 End stage renal disease: Secondary | ICD-10-CM | POA: Diagnosis not present

## 2023-03-13 DIAGNOSIS — B955 Unspecified streptococcus as the cause of diseases classified elsewhere: Secondary | ICD-10-CM | POA: Diagnosis not present

## 2023-03-13 DIAGNOSIS — B951 Streptococcus, group B, as the cause of diseases classified elsewhere: Secondary | ICD-10-CM

## 2023-03-13 DIAGNOSIS — Z9911 Dependence on respirator [ventilator] status: Secondary | ICD-10-CM | POA: Diagnosis not present

## 2023-03-13 DIAGNOSIS — I6523 Occlusion and stenosis of bilateral carotid arteries: Secondary | ICD-10-CM

## 2023-03-13 DIAGNOSIS — Z992 Dependence on renal dialysis: Secondary | ICD-10-CM

## 2023-03-13 LAB — RENAL FUNCTION PANEL
Albumin: 2 g/dL — ABNORMAL LOW (ref 3.5–5.0)
Anion gap: 17 — ABNORMAL HIGH (ref 5–15)
BUN: 58 mg/dL — ABNORMAL HIGH (ref 8–23)
CO2: 19 mmol/L — ABNORMAL LOW (ref 22–32)
Calcium: 7.6 mg/dL — ABNORMAL LOW (ref 8.9–10.3)
Chloride: 91 mmol/L — ABNORMAL LOW (ref 98–111)
Creatinine, Ser: 4.39 mg/dL — ABNORMAL HIGH (ref 0.44–1.00)
GFR, Estimated: 11 mL/min — ABNORMAL LOW (ref >60)
Glucose, Bld: 298 mg/dL — ABNORMAL HIGH (ref 70–99)
Phosphorus: 6.9 mg/dL — ABNORMAL HIGH (ref 2.5–4.6)
Potassium: 4 mmol/L (ref 3.5–5.1)
Sodium: 127 mmol/L — ABNORMAL LOW (ref 135–145)

## 2023-03-13 LAB — GLUCOSE, CAPILLARY
Glucose-Capillary: 194 mg/dL — ABNORMAL HIGH (ref 70–99)
Glucose-Capillary: 311 mg/dL — ABNORMAL HIGH (ref 70–99)
Glucose-Capillary: 197 mg/dL — ABNORMAL HIGH (ref 70–99)
Glucose-Capillary: 265 mg/dL — ABNORMAL HIGH (ref 70–99)
Glucose-Capillary: 330 mg/dL — ABNORMAL HIGH (ref 70–99)
Glucose-Capillary: 239 mg/dL — ABNORMAL HIGH (ref 70–99)

## 2023-03-13 LAB — CBC
HCT: 27.8 % — ABNORMAL LOW (ref 36.0–46.0)
Hemoglobin: 9 g/dL — ABNORMAL LOW (ref 12.0–15.0)
MCH: 23.8 pg — ABNORMAL LOW (ref 26.0–34.0)
MCHC: 32.4 g/dL (ref 30.0–36.0)
MCV: 73.5 fL — ABNORMAL LOW (ref 80.0–100.0)
Platelets: 153 10*3/uL (ref 150–400)
RBC: 3.78 MIL/uL — ABNORMAL LOW (ref 3.87–5.11)
RDW: 14.1 % (ref 11.5–15.5)
WBC: 17.5 10*3/uL — ABNORMAL HIGH (ref 4.0–10.5)
nRBC: 0 % (ref 0.0–0.2)

## 2023-03-13 LAB — C1 ESTERASE INHIBITOR: C1INH SerPl-mCnc: <8 mg/dL — ABNORMAL LOW (ref 21–39)

## 2023-03-13 LAB — CULTURE, BLOOD (ROUTINE X 2): Special Requests: ADEQUATE

## 2023-03-13 LAB — MAGNESIUM
Magnesium: 2.1 mg/dL (ref 1.7–2.4)
Magnesium: 1.8 mg/dL (ref 1.7–2.4)

## 2023-03-13 LAB — PHOSPHORUS
Phosphorus: 7 mg/dL — ABNORMAL HIGH (ref 2.5–4.6)
Phosphorus: 3.1 mg/dL (ref 2.5–4.6)

## 2023-03-13 LAB — HIV ANTIBODY (ROUTINE TESTING W REFLEX): HIV Screen 4th Generation wRfx: NONREACTIVE

## 2023-03-13 LAB — HEPATITIS B SURFACE ANTIGEN: Hepatitis B Surface Ag: NONREACTIVE

## 2023-03-13 MED ORDER — HYDRALAZINE HCL 50 MG PO TABS: 50.0000 mg | ORAL_TABLET | Freq: Two times a day (BID) | ORAL | Status: DC

## 2023-03-13 MED ORDER — HEPARIN SODIUM (PORCINE) 1000 UNIT/ML DIALYSIS
2000.0000 [IU] | INTRAMUSCULAR | Status: DC | PRN
  Administered 2023-03-13 – 2023-03-20 (×3): 2000 [IU] via INTRAVENOUS_CENTRAL
  Filled 2023-03-13 (×5): qty 2

## 2023-03-13 MED ORDER — ATORVASTATIN CALCIUM 40 MG PO TABS: 40.0000 mg | ORAL_TABLET | Freq: Every day | ORAL | Status: DC

## 2023-03-13 MED ORDER — ASPIRIN 81 MG PO CHEW
81.0000 mg | CHEWABLE_TABLET | Freq: Every day | ORAL | Status: DC
  Administered 2023-03-13 – 2023-03-25 (×13): 81 mg
  Filled 2023-03-13 (×13): qty 1

## 2023-03-13 MED ORDER — LIDOCAINE-PRILOCAINE 2.5-2.5 % EX CREA: 1.0000 | TOPICAL_CREAM | CUTANEOUS | Status: DC | PRN

## 2023-03-13 MED ORDER — SEVELAMER CARBONATE 800 MG PO TABS
800.0000 mg | ORAL_TABLET | Freq: Three times a day (TID) | ORAL | Status: DC
  Administered 2023-03-13 – 2023-03-21 (×26): 800 mg
  Filled 2023-03-13 (×26): qty 1

## 2023-03-13 MED ORDER — HEPARIN SODIUM (PORCINE) 1000 UNIT/ML DIALYSIS
1000.0000 [IU] | INTRAMUSCULAR | Status: DC | PRN
Start: 2023-03-13 — End: 2023-03-13

## 2023-03-13 MED ORDER — ALBUMIN HUMAN 25 % IV SOLN
25.0000 g | Freq: Once | INTRAVENOUS | Status: AC
  Administered 2023-03-13: 25 g via INTRAVENOUS
  Filled 2023-03-13: qty 100

## 2023-03-13 MED ORDER — INSULIN GLARGINE-YFGN 100 UNIT/ML ~~LOC~~ SOLN
10.0000 [IU] | Freq: Two times a day (BID) | SUBCUTANEOUS | Status: DC
  Administered 2023-03-13 (×2): 10 [IU] via SUBCUTANEOUS
  Filled 2023-03-13 (×4): qty 0.1

## 2023-03-13 MED ORDER — INSULIN ASPART 100 UNIT/ML IJ SOLN
0.0000 [IU] | INTRAMUSCULAR | Status: DC
  Administered 2023-03-13: 8 [IU] via SUBCUTANEOUS
  Administered 2023-03-13: 3 [IU] via SUBCUTANEOUS
  Administered 2023-03-13: 11 [IU] via SUBCUTANEOUS
  Administered 2023-03-13: 5 [IU] via SUBCUTANEOUS
  Administered 2023-03-14 (×3): 3 [IU] via SUBCUTANEOUS
  Administered 2023-03-14: 8 [IU] via SUBCUTANEOUS
  Administered 2023-03-14 – 2023-03-15 (×4): 3 [IU] via SUBCUTANEOUS
  Administered 2023-03-15: 2 [IU] via SUBCUTANEOUS
  Administered 2023-03-16 (×2): 3 [IU] via SUBCUTANEOUS
  Administered 2023-03-16: 2 [IU] via SUBCUTANEOUS
  Administered 2023-03-16: 11 [IU] via SUBCUTANEOUS
  Administered 2023-03-17: 3 [IU] via SUBCUTANEOUS
  Administered 2023-03-17: 2 [IU] via SUBCUTANEOUS
  Administered 2023-03-18: 3 [IU] via SUBCUTANEOUS
  Administered 2023-03-18 (×2): 2 [IU] via SUBCUTANEOUS
  Administered 2023-03-18 (×2): 3 [IU] via SUBCUTANEOUS
  Administered 2023-03-18: 2 [IU] via SUBCUTANEOUS
  Administered 2023-03-19: 5 [IU] via SUBCUTANEOUS
  Administered 2023-03-19: 2 [IU] via SUBCUTANEOUS
  Administered 2023-03-19 (×2): 3 [IU] via SUBCUTANEOUS
  Administered 2023-03-19: 2 [IU] via SUBCUTANEOUS
  Administered 2023-03-19: 8 [IU] via SUBCUTANEOUS
  Administered 2023-03-20: 3 [IU] via SUBCUTANEOUS
  Administered 2023-03-20 (×2): 5 [IU] via SUBCUTANEOUS
  Administered 2023-03-20 (×2): 3 [IU] via SUBCUTANEOUS
  Administered 2023-03-21: 8 [IU] via SUBCUTANEOUS
  Administered 2023-03-21 (×4): 3 [IU] via SUBCUTANEOUS
  Administered 2023-03-21: 5 [IU] via SUBCUTANEOUS
  Administered 2023-03-21: 3 [IU] via SUBCUTANEOUS
  Administered 2023-03-22: 11 [IU] via SUBCUTANEOUS
  Administered 2023-03-22 (×2): 3 [IU] via SUBCUTANEOUS
  Administered 2023-03-22: 2 [IU] via SUBCUTANEOUS
  Administered 2023-03-23: 3 [IU] via SUBCUTANEOUS
  Administered 2023-03-23: 5 [IU] via SUBCUTANEOUS
  Administered 2023-03-23: 2 [IU] via SUBCUTANEOUS
  Administered 2023-03-23 (×3): 3 [IU] via SUBCUTANEOUS
  Administered 2023-03-24: 5 [IU] via SUBCUTANEOUS
  Administered 2023-03-24: 3 [IU] via SUBCUTANEOUS
  Administered 2023-03-24: 2 [IU] via SUBCUTANEOUS
  Administered 2023-03-24 (×2): 5 [IU] via SUBCUTANEOUS
  Administered 2023-03-25: 3 [IU] via SUBCUTANEOUS
  Administered 2023-03-25: 8 [IU] via SUBCUTANEOUS
  Administered 2023-03-25: 3 [IU] via SUBCUTANEOUS
  Administered 2023-03-25 (×2): 5 [IU] via SUBCUTANEOUS
  Administered 2023-03-25: 3 [IU] via SUBCUTANEOUS
  Administered 2023-03-26: 5 [IU] via SUBCUTANEOUS
  Administered 2023-03-26 (×3): 3 [IU] via SUBCUTANEOUS
  Administered 2023-03-26: 11 [IU] via SUBCUTANEOUS
  Administered 2023-03-27: 3 [IU] via SUBCUTANEOUS
  Administered 2023-03-27 (×3): 5 [IU] via SUBCUTANEOUS
  Administered 2023-03-27: 2 [IU] via SUBCUTANEOUS
  Administered 2023-03-28: 8 [IU] via SUBCUTANEOUS
  Administered 2023-03-28: 2 [IU] via SUBCUTANEOUS
  Administered 2023-03-28 – 2023-03-29 (×3): 5 [IU] via SUBCUTANEOUS
  Administered 2023-03-29: 3 [IU] via SUBCUTANEOUS
  Administered 2023-03-29: 8 [IU] via SUBCUTANEOUS
  Administered 2023-03-30 (×3): 3 [IU] via SUBCUTANEOUS
  Administered 2023-03-30: 5 [IU] via SUBCUTANEOUS

## 2023-03-13 MED ORDER — PENTAFLUOROPROP-TETRAFLUOROETH EX AERO: 1.0000 | INHALATION_SPRAY | CUTANEOUS | Status: DC | PRN

## 2023-03-13 MED ORDER — LIDOCAINE HCL (PF) 1 % IJ SOLN: 5.0000 mL | INTRAMUSCULAR | Status: DC | PRN

## 2023-03-13 MED ORDER — ATORVASTATIN CALCIUM 80 MG PO TABS
80.0000 mg | ORAL_TABLET | Freq: Every day | ORAL | Status: DC
  Administered 2023-03-13 – 2023-03-31 (×18): 80 mg
  Filled 2023-03-13: qty 2
  Filled 2023-03-13 (×2): qty 1
  Filled 2023-03-13: qty 2
  Filled 2023-03-13 (×2): qty 1
  Filled 2023-03-13: qty 2
  Filled 2023-03-13: qty 1
  Filled 2023-03-13 (×3): qty 2
  Filled 2023-03-13 (×2): qty 1
  Filled 2023-03-13: qty 2
  Filled 2023-03-13 (×4): qty 1

## 2023-03-13 NOTE — Progress Notes (Signed)
[facility] Progress Note   Assessment/ Plan:   1 Angioedema: occurred 12/2021 as well.  Both times this has happened have been in the setting of infection but that's the only common link I can identify- and I am not sure that it was the same organism anyway.  D/w PCCM- they will order CT maxillofacial with contrast (fine in ESRD) to look for oropharyngeal nidus of infection in the setting of S agalactiae bacteremia.  Maybe check C1 esterase.    - nasally intubated with ENT  - getting dexamethasone   2.  S agalactiae bacteremia: as above in #1, on cefazolin 3 ESRD: TTS [facility].  Will provide HD on schedule.   4 Hypertension: reasonably controlled.  UF as tolerated-- BP low, stop clonidine patch 5. 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 Metabolic Bone Disease:  binders/ vits when eating 7.  Dispo: in ICU for now  Subjective:    Seen on HD this AM.  No cuff leak.  BP on the lower side- clonidine patch d/c'd while intubated.  CT maxillofacial without abscess.   Objective:   BP (!) 111/52 (BP Location: Left Arm)   Pulse 91   Temp 99 F (37.2 C) (Oral)   Resp 20   Ht 5\' 2"  (1.575 m)   Wt 39.1 kg   SpO2 100%   BMI 15.77 kg/m   Physical Exam: GEN  ill-appearing intubated and sedated HEENT eyes closed, top lip better, bottom lip still with + angioedema NECK no JVD PULM mech coarse bilaterally CV RRR ABD soft EXT some nonpitting edema upper extremities NEURO intubated and sedated SKIN no rashes ACCESS: R AVG + T/B  Labs: BMET Recent Labs  Lab 03/11/23 0909 03/11/23 1320 03/11/23 1617 03/12/23 0609 03/12/23 1733 03/13/23 0500 03/13/23 0642  NA 135 132* 132* 135  --   --  127*  K 3.6 3.8 3.5 3.5  --   --  4.0  CL 95*  --   --  92*  --   --  91*  CO2 27  --   --  23  --   --  19*  GLUCOSE 151*  --   --  115*  --   --  298*  BUN 24*  --   --  37*  --   --  58*  CREATININE 3.23*  --   --  3.64*  --   --  4.39*  CALCIUM 8.8*  --   --  8.7*  --   --   7.6*  PHOS  --   --   --   --  5.7* 7.0* 6.9*   CBC Recent Labs  Lab 03/11/23 0909 03/11/23 1320 03/11/23 1617 03/12/23 0609 03/13/23 0642  WBC 23.0*  --   --  25.3* 17.5*  NEUTROABS 21.3*  --   --   --   --   HGB 11.8* 8.8* 12.9 12.5 9.0*  HCT 37.7 26.0* 38.0 38.1 27.8*  MCV 78.5*  --   --  75.4* 73.5*  PLT 186  --   --  150 153      Medications:     aspirin  81 mg Per Tube Daily   atorvastatin  80 mg Per Tube Daily   Chlorhexidine Gluconate Cloth  6 each Topical Daily   Chlorhexidine Gluconate Cloth  6 each Topical Q0600   dexamethasone (DECADRON) injection  4 mg Intravenous Q6H   docusate  100 mg Per Tube BID  heparin  5,000 Units Subcutaneous Q8H   insulin aspart  0-15 Units Subcutaneous Q4H   insulin glargine-yfgn  10 Units Subcutaneous BID   multivitamin  1 tablet Per Tube QHS   mouth rinse  15 mL Mouth Rinse Q2H   pantoprazole (PROTONIX) IV  40 mg Intravenous QHS   polyethylene glycol  17 g Per Tube Daily   sevelamer carbonate  800 mg Per Tube TID   thiamine (VITAMIN B1) injection  100 mg Intravenous Daily     Bufford Buttner, MD 03/13/2023, 10:33 AM

## 2023-03-13 NOTE — Progress Notes (Signed)
POST HD TX NOTE  03/13/23 1219  Vitals  Temp 98.3 F (36.8 C)  Temp Source Oral  BP (!) 147/66  MAP (mmHg) 88  BP Location Left Arm  BP Method Automatic  Patient Position (if appropriate) Lying  Pulse Rate 93  Pulse Rate Source Monitor  ECG Heart Rate 93  Resp 20  Oxygen Therapy  SpO2 100 %  O2 Device Ventilator  FiO2 (%) 40 %  Pulse Oximetry Type Continuous  During Treatment Monitoring  Intra-Hemodialysis Comments (S)   (post HD tx VS check)  Post Treatment  Dialyzer Clearance Lightly streaked  Duration of HD Treatment -hour(s) 3.5 hour(s)  Hemodialysis Intake (mL) 100 mL (albumin)  Liters Processed 84  Fluid Removed (mL) 2000 mL ( (value from machine) - (albumin) = 2000 ml)  Tolerated HD Treatment Yes  Post-Hemodialysis Comments (S)  tx completed w/o problem other than 1 episode of dropping bp that was resolved w/ albumin admin. UF goal met, blood rinsed back, VSS. Medication Admin: Heparin 2000 units bolus, Albumin 25% 25G IVPB  AVG/AVF Arterial Site Held (minutes) 10 minutes  AVG/AVF Venous Site Held (minutes) 10 minutes  Fistula / Graft Right Upper arm Arteriovenous fistula  Placement Date/Time: 03/11/23 1900   Placed prior to admission: Yes  Orientation: Right  Access Location: Upper arm  Access Type: (c) Arteriovenous fistula  Site Condition No complications  Fistula / Graft Assessment Bruit;Thrill;Present  Drainage Description None

## 2023-03-13 NOTE — Procedures (Signed)
Patient seen and examined on Hemodialysis. The procedure was supervised and I have made appropriate changes. BP (!) 111/52 (BP Location: Left Arm)   Pulse 91   Temp 99 F (37.2 C) (Oral)   Resp 20   Ht 5\' 2"  (1.575 m)   Wt 39.1 kg   SpO2 100%   BMI 15.77 kg/m   QB 400 mL/ min via R AVG, UF goal 2L  Tolerating treatment without complaints at this time.   Bufford Buttner MD [facility] [phone] 10:36 AM

## 2023-03-13 NOTE — Progress Notes (Signed)
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 She also developed SOB and hoarse voice and ENT was consulted. She was ultimately taken to OR and nasally intubated by ENT.  Post op she was brought to ICU and PCCM consulted for ICU mgmt.  Pt denies recent medication changes or new medications  She had similar event ~1 year ago requiring intubation. Janina Mayo was considered at the time but ultimately not needed and no clear cause identified.   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   Significant Hospital Events: Including procedures, antibiotic start and stop dates in addition to other pertinent events   6/18 admitted, OR for nasal intubation due to glottic edema  Interim History / Subjective:  Remains intubated, sedated. Trying to pull on her ETT.   Objective   Blood pressure 114/64, pulse 84,  temperature 98.7 F (37.1 C), temperature source Oral, resp. rate 20, height 5\' 2"  (1.575 m), weight 38.7 kg, SpO2 99 %.    Vent Mode: PRVC FiO2 (%):  [40 %] 40 % Set Rate:  [20 bmp] 20 bmp Vt Set:  [400 mL] 400 mL PEEP:  [5 cmH20] 5 cmH20 Plateau Pressure:  [10 cmH20-19 cmH20] 17 cmH20   Intake/Output Summary (Last 24 hours) at 03/13/2023 0836 Last data filed at 03/13/2023 0600 Gross per 24 hour  Intake 1440.99 ml  Output 0 ml  Net 1440.99 ml    Filed Weights   03/11/23 1520 03/12/23 0500 03/13/23 0500  Weight: 41 kg 38.7 kg 38.7 kg    Examination: General: elderly woman sitting up in bed in NAD HENT: [place]/AT, nasal intubation, still large bottom lip Lungs: CTAB, breathing comfortably on MV  Cardiovascular: S1S2, RRR Abdomen: soft, NT, ND Extremities: no edema or cyanosis Neuro: RASS 0, following commands.  Derm: warm, dry, no rashes  Echo: LVEF 60-65%, no RWMA, normal RV size and function. Normal RV size and function. Degenerative MV, no MR or MS. Normal IVC.  Mild AI.   CT maxillofacial: no dental abscesses, possible left cellulitis of face and neck  Blood cultures> strep agalactiae 4/4  Resolved Hospital Problem list     Assessment & Plan:  Subglottic edema/ compromised airway- unclear etiology. S/p fiberoptic nasal intubation by ENT in OR 6/18. Of note had similar previous episode ~1 year ago. With bacteremia question epiglottitis. 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  Post op vent management  -LTVV -VAP prevention protocol - PAD protocol for sedation-propofol and fentanyl.  Adding wrist restraints to prevent self extubation. - Daily SAT and SBT once appropriate. -  Daily cuff leak test-no cuff leak present today. -Continue steroids - Continue antibiotics - May need allergy follow-up to determine if she needs an EpiPen.  C1 esterase level pending to rule out hereditary angioedema. 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 -ID consult -may need fistula imaging to rule out endovascular seeding-defer to ID  ESRD  hyperphosphatemia -HD today per nephro -Strict I's/O - Renally dose meds -Resume PTA sevelamer  DM with hyperglycemia; A1c 9 - SSI as needed - Adding long-acting insulin 10 units twice daily -goal BG 140-180 -needs diabetes education prior to discharge  HTN, well controlled on propofol -Hydralazine and labetalol as needed  Chronic moderate protein energy malnutrition -Tube feeding  Carotid stenosis on imaging -follow up carotid US -Statin  Best Practice (right click and "Reselect all SmartList Selections" daily)   Diet/type: tubefeeds DVT prophylaxis: prophylactic heparin  GI prophylaxis: PPI Lines: N/A Foley:  N/A Code Status:  full code Last date of multidisciplinary goals of care discussion []   Labs   CBC: Recent Labs  Lab 03/11/23 0909 03/11/23 1320 03/11/23 1617 03/12/23 0609  WBC 23.0*  --   --  25.3*  NEUTROABS 21.3*  --   --   --   HGB 11.8* 8.8* 12.9 12.5  HCT 37.7 26.0* 38.0 38.1  MCV 78.5*  --   --  75.4*  PLT 186  --   --  150     Basic Metabolic Panel: Recent Labs  Lab 03/11/23 0909 03/11/23 1320 03/11/23 1617 03/12/23 0609 03/12/23 1733  NA 135 132* 132* 135  --   K 3.6 3.8 3.5 3.5  --   CL 95*  --   --  92*  --   CO2 27  --   --  23  --   GLUCOSE 151*  --   --  115*  --   BUN 24*  --   --  37*  --   CREATININE 3.23*  --   --  3.64*  --   CALCIUM 8.8*  --   --  8.7*  --   MG 1.8  --   --  1.9 1.9  PHOS  --   --   --   --  5.7*    GFR: Estimated Creatinine Clearance: 9.3 mL/min (A) (by C-G formula based on SCr of 3.64 mg/dL (H)). Recent Labs  Lab 03/11/23 0909 03/11/23 1145 03/12/23 0609  WBC 23.0*  --  25.3*  LATICACIDVEN 1.8 1.6  --      Liver Function Tests: Recent Labs  Lab 03/11/23 0909  AST 56*  ALT 45*  ALKPHOS 160*  BILITOT 0.5   PROT 6.8  ALBUMIN 3.3*       Critical care time:       This patient is critically ill with multiple organ system failure which requires frequent high complexity decision making, assessment, support, evaluation, and titration of therapies. This was completed through the application of advanced monitoring technologies and extensive interpretation of multiple databases. During this encounter critical care time was devoted to patient care services described in this note for 38 minutes.  Steffanie Dunn, DO 03/13/23 9:04 AM  [facility]  For contact information, see Amion. If no response to pager, please call PCCM consult pager. After hours, 7PM- 7AM, please call Elink.

## 2023-03-13 NOTE — Progress Notes (Signed)
Subjective: Stable intubated and sedated.  No cuff leak today.  Objective: Vital signs in last 24 hours: Temp:  [96.9 F (36.1 C)-99 F (37.2 C)] 99 F (37.2 C) (06/20 0745) Pulse Rate:  [76-102] 91 (06/20 1145) Resp:  [13-22] 20 (06/20 1145) BP: (74-158)/(25-91) 125/65 (06/20 1145) SpO2:  [98 %-100 %] 100 % (06/20 1145) FiO2 (%):  [40 %] 40 % (06/20 1145) Weight:  [38.7 kg-39.1 kg] 39.1 kg (06/20 0745) Wt Readings from Last 1 Encounters:  03/13/23 39.1 kg    Intake/Output from previous day: 06/19 0701 - 06/20 0700 In: 1498.2 [I.V.:1023.8; NG/GT:414.3; IV Piggyback:60.2] Out: 0  Intake/Output this shift: Total I/O In: 57.3 [I.V.:22.3; NG/GT:35] Out: -   General appearance: no distress and intubated and sedated Head: lip swelling fully resolved  Recent Labs    03/12/23 0609 03/13/23 0642  WBC 25.3* 17.5*  HGB 12.5 9.0*  HCT 38.1 27.8*  PLT 150 153    Recent Labs    03/12/23 0609 03/13/23 0642  NA 135 127*  K 3.5 4.0  CL 92* 91*  CO2 23 19*  GLUCOSE 115* 298*  BUN 37* 58*  CREATININE 3.64* 4.39*  CALCIUM 8.7* 7.6*    Medications: I have reviewed the patient's current medications.  Assessment/Plan: Lip and laryngeal edema  The lips look better but she does not have a cuff leak.  I discussed with her nurse plan to try again tomorrow.  Agree not to extubate without a cuff leak.  Tracheostomy may be necessary if airway does not improve.   LOS: 2 days   Christia Reading 03/13/2023, 12:11 PM

## 2023-03-13 NOTE — Progress Notes (Signed)
Pt receives out-pt HD at [facility] on TTS. Will assist as needed.   Jalin Erpelding Renal Navigator [phone] 

## 2023-03-13 NOTE — Consult Note (Signed)
[facility]    Date of Admission:  03/11/2023   Total days of inpatient antibiotics 2        Reason for Consult: GBS bacteremia    Principal Problem:   Status post tracheostomy [facility])   Assessment: 67 year old female with ESRD on HD, prior left upper extremity graft infected status post removal in 2023, diabetes mellitus presented with shortness of breath From dialysis center. #Group B strep bacteremia #Supraglottic edema #ESRD with right upper extremity aVF #Penicillin allergy - Of note back in 2023, patient had undergone removal of left extremity graft.  Found to have an anticipated difficulty with airway repair and glandular cancer.  She was given steroids for airway edema and then extubated on 4/2 - Patient presented during this admission following dialysis with shortness of breath.  ENT was consulted patient had hoarseness.  She was taken to the OR and nasally intubated, found to have epiglottic edema and supra Rodick edema.  ID engaged as blood cultures grew group B strep.  TTE did not show vegetation, although aortic valve was well not visualized. Following this Recommendations:  -Continue cefazolin - Follow repeat blood cultures to ensure clearance - TEE - Will get ultrasound of right upper extremity fistula, does not look overtly infected.  Of note, last infection with access was left graft not a  fistula.  Fistulas generally do not have prosthetic material, less likely able to get infected without overt signs infection. Microbiology:   Antibiotics: Cefazolin 6/18-present  Cultures: Blood 6/18 2/2 group B strep 6/19    HPI: Susan Evans is a 67 y.o. female with past medical history of ESRD on HD via right AV fistula, diabetes mellitus, GERD, remote history of laryngeal cancer treated with surgery XRT in 2011 presented with lip swelling noted at dialysis where they asked attempted extra volume removal.  She developed tachycardia  and malaise and taken to the ED as it progressed.  She also has shortness of breath.  ENT consulted, taken to the OR and nasally intubated by the ED, noted supraglottic edema, thickened epiglottis..  Transferred to ICU PCCM for management.  Found to have GBS bacteremia.  ID engaged.     Review of Systems: Review of Systems  Unable to perform ROS: Intubated    Past Medical History:  Diagnosis Date   AKI (acute kidney injury) (HCC) 08/12/2019   Anaphylactic shock, unspecified, initial encounter 05/09/2020   Chronic kidney disease    Dialysis T/Th/Sat- [address]   CKD stage G1/A3, GFR > 90 and albumin creatinine ratio >300 mg/g 07/08/2016   Nephrotic range proteinuria   Diabetes mellitus    type 2    Dyspnea    "when I have too much fluid."   Ganglion cyst    right wrist   GERD (gastroesophageal reflux disease)    History of radiation therapy 02/27/10 -04/13/10   left base of tongue, bilat neck   Hyperlipidemia    Hypertension    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/21/2021   Iron deficiency anemia    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   Menopause    Thyroid disease    Hyperthyroidism   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/08/2020   Tobacco abuse    PT HAS QUIT    Social History   Tobacco Use   Smoking status: Former    Packs/day: .3    Types: Cigarettes  Quit date: 04/03/2021    Years since quitting: 1.9    Passive exposure: Never   Smokeless tobacco: Never   Tobacco comments:    1 cig per day   Vaping Use   Vaping Use: Never used  Substance Use Topics   Alcohol use: No    Alcohol/week: 0.0 standard drinks of alcohol   Drug use: No    Family History  Problem Relation Age of Onset   Stroke Father    Cancer Father        unknown ca   Hypertension Mother    Diabetes Mother    Chronic Renal Failure Sister    Chronic Renal Failure Brother    Cancer Paternal Aunt        colon ca   Breast cancer Neg  Hx    Scheduled Meds:  aspirin  81 mg Per Tube Daily   atorvastatin  80 mg Per Tube Daily   Chlorhexidine Gluconate Cloth  6 each Topical Daily   Chlorhexidine Gluconate Cloth  6 each Topical Q0600   dexamethasone (DECADRON) injection  4 mg Intravenous Q6H   docusate  100 mg Per Tube BID   heparin  5,000 Units Subcutaneous Q8H   insulin aspart  0-15 Units Subcutaneous Q4H   insulin glargine-yfgn  10 Units Subcutaneous BID   multivitamin  1 tablet Per Tube QHS   mouth rinse  15 mL Mouth Rinse Q2H   pantoprazole (PROTONIX) IV  40 mg Intravenous QHS   polyethylene glycol  17 g Per Tube Daily   sevelamer carbonate  800 mg Per Tube TID   thiamine (VITAMIN B1) injection  100 mg Intravenous Daily   Continuous Infusions:  sodium chloride 10 mL/hr at 03/13/23 0800    ceFAZolin (ANCEF) IV Stopped (03/12/23 2233)   feeding supplement (OSMOLITE 1.5 CAL) 35 mL/hr at 03/13/23 0800   propofol (DIPRIVAN) infusion 50 mcg/kg/min (03/13/23 1327)   PRN Meds:.sodium chloride, fentaNYL (SUBLIMAZE) injection, fentaNYL (SUBLIMAZE) injection, heparin, hydrALAZINE, labetalol, lidocaine (PF), lidocaine-prilocaine, mouth rinse, pentafluoroprop-tetrafluoroeth Allergies  Allergen Reactions   Hydrochlorothiazide Itching and Other (See Comments)    Hyponatremia    Penicillins Other (See Comments)    "Takes out all of her hair"    OBJECTIVE: Blood pressure (!) 147/66, pulse 93, temperature 98.3 F (36.8 C), temperature source Oral, resp. rate 20, height 5\' 2"  (1.575 m), weight 37 kg, SpO2 100 %.  Physical Exam Constitutional:      Appearance: Normal appearance.     Comments: Intubated  HENT:     Head: Normocephalic and atraumatic.     Nose: Nose normal.     Mouth/Throat:     Mouth: Mucous membranes are moist.  Eyes:     Extraocular Movements: Extraocular movements intact.     Conjunctiva/sclera: Conjunctivae normal.     Pupils: Pupils are equal, round, and reactive to light.  Cardiovascular:      Rate and Rhythm: Normal rate and regular rhythm.     Heart sounds: No murmur heard.    No friction rub. No gallop.  Pulmonary:     Effort: Pulmonary effort is normal.     Breath sounds: Normal breath sounds.  Abdominal:     General: Abdomen is flat.     Palpations: Abdomen is soft.  Musculoskeletal:        General: Normal range of motion.  Skin:    General: Skin is warm and dry.     Lab Results Lab Results  Component Value Date  WBC 17.5 (H) 03/13/2023   HGB 9.0 (L) 03/13/2023   HCT 27.8 (L) 03/13/2023   MCV 73.5 (L) 03/13/2023   PLT 153 03/13/2023    Lab Results  Component Value Date   CREATININE 4.39 (H) 03/13/2023   BUN 58 (H) 03/13/2023   NA 127 (L) 03/13/2023   K 4.0 03/13/2023   CL 91 (L) 03/13/2023   CO2 19 (L) 03/13/2023    Lab Results  Component Value Date   ALT 45 (H) 03/11/2023   AST 56 (H) 03/11/2023   ALKPHOS 160 (H) 03/11/2023   BILITOT 0.5 03/11/2023       Danelle Earthly, MD [facility] 03/13/2023, 2:54 PM  I have personally spent 97 minutes involved in face-to-face and non-face-to-face activities for this patient on the day of the visit. Professional time spent includes the following activities: Preparing to see the patient (review of tests), Obtaining and/or reviewing separately obtained history (admission/discharge record), Performing a medically appropriate examination and/or evaluation , Ordering medications/tests/procedures, referring and communicating with other health care professionals, Documenting clinical information in the EMR, Independently interpreting results (not separately reported), Communicating results to the patient/family/caregiver, Counseling and educating the patient/family/caregiver and Care coordination (not separately reported).

## 2023-03-13 NOTE — Progress Notes (Signed)
Carotid artery duplex has been completed. Preliminary results can be found in CV Proc through chart review.   03/13/23 2:24 PM Olen Cordial RVT

## 2023-03-13 NOTE — Progress Notes (Signed)
No cuff leak noted on morning exam.

## 2023-03-14 ENCOUNTER — Inpatient Hospital Stay (HOSPITAL_COMMUNITY): Payer: 59

## 2023-03-14 DIAGNOSIS — R7881 Bacteremia: Secondary | ICD-10-CM | POA: Diagnosis not present

## 2023-03-14 DIAGNOSIS — B951 Streptococcus, group B, as the cause of diseases classified elsewhere: Secondary | ICD-10-CM | POA: Diagnosis not present

## 2023-03-14 DIAGNOSIS — J384 Edema of larynx: Secondary | ICD-10-CM | POA: Diagnosis not present

## 2023-03-14 DIAGNOSIS — N186 End stage renal disease: Secondary | ICD-10-CM | POA: Diagnosis not present

## 2023-03-14 LAB — CULTURE, BLOOD (ROUTINE X 2): Culture: NO GROWTH

## 2023-03-14 LAB — HEPATITIS B SURFACE ANTIBODY, QUANTITATIVE: Hep B S AB Quant (Post): 715 m[IU]/mL (ref >9.9)

## 2023-03-14 LAB — GLUCOSE, CAPILLARY
Glucose-Capillary: 172 mg/dL — ABNORMAL HIGH (ref 70–99)
Glucose-Capillary: 180 mg/dL — ABNORMAL HIGH (ref 70–99)
Glucose-Capillary: 185 mg/dL — ABNORMAL HIGH (ref 70–99)
Glucose-Capillary: 189 mg/dL — ABNORMAL HIGH (ref 70–99)
Glucose-Capillary: 198 mg/dL — ABNORMAL HIGH (ref 70–99)
Glucose-Capillary: 269 mg/dL — ABNORMAL HIGH (ref 70–99)

## 2023-03-14 LAB — PHOSPHORUS: Phosphorus: 3.5 mg/dL (ref 2.5–4.6)

## 2023-03-14 LAB — MAGNESIUM: Magnesium: 2 mg/dL (ref 1.7–2.4)

## 2023-03-14 MED ORDER — INSULIN GLARGINE-YFGN 100 UNIT/ML ~~LOC~~ SOLN
15.0000 [IU] | Freq: Two times a day (BID) | SUBCUTANEOUS | Status: DC
  Administered 2023-03-14 – 2023-03-16 (×6): 15 [IU] via SUBCUTANEOUS
  Filled 2023-03-14 (×8): qty 0.15

## 2023-03-14 NOTE — Progress Notes (Signed)
Subjective: Resting comfortably.  Still no cuff leak.  Objective: Vital signs in last 24 hours: Temp:  [97.5 F (36.4 C)-99.1 F (37.3 C)] 97.5 F (36.4 C) (06/21 0735) Pulse Rate:  [68-93] 77 (06/21 0800) Resp:  [15-21] 19 (06/21 0800) BP: (92-179)/(44-123) 179/81 (06/21 0800) SpO2:  [98 %-100 %] 100 % (06/21 0800) FiO2 (%):  [40 %] 40 % (06/21 0800) Weight:  [37 kg-40.5 kg] 40.5 kg (06/21 0500) Wt Readings from Last 1 Encounters:  03/14/23 40.5 kg    Intake/Output from previous day: 06/20 0701 - 06/21 0700 In: 1278.3 [I.V.:388.3; NG/GT:840; IV Piggyback:50] Out: 2000  Intake/Output this shift: Total I/O In: 47.3 [I.V.:12.3; NG/GT:35] Out: -   General appearance: no distress and intubated Head: Lips with no edema  Recent Labs    03/12/23 0609 03/13/23 0642  WBC 25.3* 17.5*  HGB 12.5 9.0*  HCT 38.1 27.8*  PLT 150 153    Recent Labs    03/12/23 0609 03/13/23 0642  NA 135 127*  K 3.5 4.0  CL 92* 91*  CO2 23 19*  GLUCOSE 115* 298*  BUN 37* 58*  CREATININE 3.64* 4.39*  CALCIUM 8.7* 7.6*    Medications: I have reviewed the patient's current medications.  Assessment/Plan: Resolved lip edema, laryngeal edema  With still no cuff leak, she must remain intubated.  If a cuff leak does not develop by early next week, will plan tracheostomy.   LOS: 3 days   Christia Reading 03/14/2023, 11:10 AM

## 2023-03-14 NOTE — Progress Notes (Signed)
[facility] Progress Note   Assessment/ Plan:   1 Angioedema: occurred 12/2021 as well.  Both times this has happened have been in the setting of infection but that's the only common link I can identify- and I am not sure that it was the same organism anyway.  D/w PCCM- they will order CT maxillofacial with contrast (fine in ESRD) to look for oropharyngeal nidus of infection in the setting of S agalactiae bacteremia.  Maybe check C1 esterase.    - nasally intubated with ENT  - getting dexamethasone  - C1 esterase inhibitor < 8--> seems like most likely cause of issues- greatly appreciate pharmacy  - pt is already dialyzing on a hypoallergenic dialyzer as an OP (has been for the past year)- will keep this on indefinitely 2.  S agalactiae bacteremia: as above in #1, on cefazolin 3 ESRD: TTS [facility].  Will provide HD on schedule.  Next HD 6/22 4 Hypertension: reasonably controlled.  UF as tolerated-- BP low, stop clonidine patch 5. 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 Metabolic Bone Disease:  binders/ vits when eating 7.  Dispo: in ICU for now  Subjective:    Seen in room.  C1 esterase inhibitor resulted really low.     Objective:   BP (!) 179/81 (BP Location: Left Arm)   Pulse 77   Temp (!) 97.5 F (36.4 C) (Axillary)   Resp 19   Ht 5\' 2"  (1.575 m)   Wt 40.5 kg   SpO2 100%   BMI 16.33 kg/m   Physical Exam: GEN  NAD, sitting in bed HEENT lower lip improving NECK no JVD PULM mech coarse bilaterally CV RRR ABD soft EXT some nonpitting edema upper extremities NEURO intubated and sedated SKIN no rashes ACCESS: R AVG + T/B  Labs: BMET Recent Labs  Lab 03/11/23 0909 03/11/23 1320 03/11/23 1617 03/12/23 0609 03/12/23 1733 03/13/23 0500 03/13/23 0642 03/13/23 1802 03/14/23 0810  NA 135 132* 132* 135  --   --  127*  --   --   K 3.6 3.8 3.5 3.5  --   --  4.0  --   --   CL 95*  --   --  92*  --   --  91*  --   --   CO2 27  --   --  23  --   --  19*   --   --   GLUCOSE 151*  --   --  115*  --   --  298*  --   --   BUN 24*  --   --  37*  --   --  58*  --   --   CREATININE 3.23*  --   --  3.64*  --   --  4.39*  --   --   CALCIUM 8.8*  --   --  8.7*  --   --  7.6*  --   --   PHOS  --   --   --   --  5.7* 7.0* 6.9* 3.1 3.5   CBC Recent Labs  Lab 03/11/23 0909 03/11/23 1320 03/11/23 1617 03/12/23 0609 03/13/23 0642  WBC 23.0*  --   --  25.3* 17.5*  NEUTROABS 21.3*  --   --   --   --   HGB 11.8* 8.8* 12.9 12.5 9.0*  HCT 37.7 26.0* 38.0 38.1 27.8*  MCV 78.5*  --   --  75.4* 73.5*  PLT  186  --   --  150 153      Medications:     aspirin  81 mg Per Tube Daily   atorvastatin  80 mg Per Tube Daily   Chlorhexidine Gluconate Cloth  6 each Topical Daily   Chlorhexidine Gluconate Cloth  6 each Topical Q0600   dexamethasone (DECADRON) injection  4 mg Intravenous Q6H   docusate  100 mg Per Tube BID   heparin  5,000 Units Subcutaneous Q8H   insulin aspart  0-15 Units Subcutaneous Q4H   insulin glargine-yfgn  15 Units Subcutaneous BID   multivitamin  1 tablet Per Tube QHS   mouth rinse  15 mL Mouth Rinse Q2H   pantoprazole (PROTONIX) IV  40 mg Intravenous QHS   polyethylene glycol  17 g Per Tube Daily   sevelamer carbonate  800 mg Per Tube TID   thiamine (VITAMIN B1) injection  100 mg Intravenous Daily     Bufford Buttner, MD 03/14/2023, 9:52 AM

## 2023-03-14 NOTE — Inpatient Diabetes Management (Signed)
Inpatient Diabetes Program Recommendations  AACE/ADA: New Consensus Statement on Inpatient Glycemic Control (2015)  Target Ranges:  Prepandial:   less than 140 mg/dL      Peak postprandial:   less than 180 mg/dL (1-2 hours)      Critically ill patients:  140 - 180 mg/dL   Lab Results  Component Value Date   GLUCAP 189 (H) 03/14/2023   HGBA1C 9.0 (A) 11/20/2022    Review of Glycemic Control  Latest Reference Range & Units 03/13/23 19:08 03/13/23 23:15 03/14/23 03:13 03/14/23 07:33  Glucose-Capillary 70 - 99 mg/dL 811 (H) 914 (H) 782 (H) 189 (H)  (H): Data is abnormally high Diabetes history: Type 2 DM Outpatient Diabetes medications: Novolog TID Current orders for Inpatient glycemic control: Semglee 15 units BID, Novolog 0-15 units Q4H Decadron 4 mg Q6H Osmolite @ 35 ml/hr  Inpatient Diabetes Program Recommendations:    Consider adding Novolog 3 units Q4H for tube feed coverage (to be stopped or held in the event tube feeds stopped).   Thanks, Lujean Rave, MSN, RNC-OB Diabetes Coordinator [phone] (8a-5p)

## 2023-03-14 NOTE — Progress Notes (Signed)
[facility]  Date of Admission:  03/11/2023   Total days of inpatient antibiotics 3  Principal Problem:   Status post tracheostomy [facility])          Assessment: 67 year old female with ESRD on HD, prior left upper extremity graft infected status post removal in 2023, diabetes mellitus presented with shortness of breath From dialysis center.  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  Found to have an anticipated difficulty with airway repair and glandular cancer.  She was given steroids for airway edema and then extubated on 4/2 - Patient presented during this admission following dialysis with shortness of breath.  ENT was consulted patient had hoarseness.  She was taken to the OR and nasally intubated, found to have epiglottic edema and supra glottic edema.  ID engaged as blood cultures grew group B strep.  TTE did not show vegetation, although aortic valve was well not visualized. - Cards not able to do TEE at this time due to epiglottis edema  Recommendations:  -Continue cefazolin - Follow repeat blood cultures to ensure clearance - CT aright upper extremity to see if there is fistula involvement as US showed some surrounding edema.  I suspect that port of entry of bacteremia was fistula leading to epiglottis - TEE when able. ENT noted resolved lip and laryngeal edema, remain intubated as no cuff leak, possible trach next week.   Dr. Elinor Parkinson will be covering this weekend Microbiology:   Antibiotics: Cefazolin 6/18-present   Cultures: Blood 6/18 2/2 group B strep 6/19      SUBJECTIVE: Reting in bed. Family at bedside Interval: Afebrile overnight.  Review of Systems: Review of Systems  Unable to perform ROS: Intubated     Scheduled Meds:   aspirin  81 mg Per Tube Daily   atorvastatin  80 mg Per Tube Daily   Chlorhexidine Gluconate Cloth  6 each Topical Daily   Chlorhexidine Gluconate Cloth  6 each Topical Q0600   dexamethasone (DECADRON) injection  4 mg Intravenous Q6H   docusate  100 mg Per Tube BID   heparin  5,000 Units Subcutaneous Q8H   insulin aspart  0-15 Units Subcutaneous Q4H   insulin glargine-yfgn  15 Units Subcutaneous BID   multivitamin  1 tablet Per Tube QHS   mouth rinse  15 mL Mouth Rinse Q2H   pantoprazole (PROTONIX) IV  40 mg Intravenous QHS   polyethylene glycol  17 g Per Tube Daily   sevelamer carbonate  800 mg Per Tube TID   thiamine (VITAMIN B1) injection  100 mg Intravenous Daily   Continuous Infusions:  sodium chloride Stopped (03/13/23 1623)    ceFAZolin (ANCEF) IV Stopped (03/13/23 2209)   feeding supplement (OSMOLITE 1.5 CAL) 35 mL/hr at 03/14/23 2000   propofol (DIPRIVAN) infusion 50 mcg/kg/min (03/14/23 2000)   PRN Meds:.sodium chloride, fentaNYL (SUBLIMAZE) injection, fentaNYL (SUBLIMAZE) injection, heparin, hydrALAZINE, labetalol, lidocaine (PF), lidocaine-prilocaine, mouth rinse, pentafluoroprop-tetrafluoroeth Allergies  Allergen Reactions   Hydrochlorothiazide Itching and Other (See Comments)    Hyponatremia    Penicillins Other (See Comments)    "Takes out all of her hair"    OBJECTIVE: Vitals:   03/14/23 1930 03/14/23 1945 03/14/23 1947 03/14/23 2000  BP: (!) 144/75 (!) 137/97  (!) 148/70  Pulse: 81 85  77  Resp: 18 11  (!) 21  Temp:    98.2 F (36.8 C)  TempSrc:    Axillary  SpO2: 100% 100% 100% 100%  Weight:      Height:       Body mass index is 16.33 kg/m.  Physical Exam Constitutional:      Appearance: Normal appearance.     Comments: Intubated  HENT:     Head: Normocephalic and atraumatic.     Right Ear: Tympanic membrane normal.     Left Ear: Tympanic membrane normal.     Nose: Nose normal.     Mouth/Throat:     Mouth: Mucous membranes are moist.  Eyes:      Extraocular Movements: Extraocular movements intact.     Conjunctiva/sclera: Conjunctivae normal.     Pupils: Pupils are equal, round, and reactive to light.  Cardiovascular:     Rate and Rhythm: Normal rate and regular rhythm.     Heart sounds: No murmur heard.    No friction rub. No gallop.  Pulmonary:     Effort: Pulmonary effort is normal.     Breath sounds: Normal breath sounds.  Abdominal:     General: Abdomen is flat.     Palpations: Abdomen is soft.  Musculoskeletal:        General: Normal range of motion.  Skin:    General: Skin is warm and dry.  Neurological:     Mental Status: She is alert.       Lab Results Lab Results  Component Value Date   WBC 17.5 (H) 03/13/2023   HGB 9.0 (L) 03/13/2023   HCT 27.8 (L) 03/13/2023   MCV 73.5 (L) 03/13/2023   PLT 153 03/13/2023    Lab Results  Component Value Date   CREATININE 4.39 (H) 03/13/2023   BUN 58 (H) 03/13/2023   NA 127 (L) 03/13/2023   K 4.0 03/13/2023   CL 91 (L) 03/13/2023   CO2 19 (L) 03/13/2023    Lab Results  Component Value Date   ALT 45 (H) 03/11/2023   AST 56 (H) 03/11/2023   ALKPHOS 160 (H) 03/11/2023   BILITOT 0.5 03/11/2023        Danelle Earthly, MD [facility] 03/14/2023, 9:06 PM  I have personally spent 54 minutes involved in face-to-face and non-face-to-face activities for this patient on the day of the visit. Professional time spent includes the following activities: Preparing to see the patient (review of tests), Obtaining and/or reviewing separately obtained history (admission/discharge record), Performing a medically appropriate examination and/or evaluation , Ordering medications/tests/procedures, referring and communicating with other health care professionals, Documenting clinical information in the EMR, Independently interpreting results (not separately reported), Communicating results to the patient/family/caregiver,  Counseling and educating the patient/family/caregiver and Care coordination (not separately reported).

## 2023-03-14 NOTE — Progress Notes (Signed)
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 She also developed SOB and hoarse voice and ENT was consulted. She was ultimately taken to OR and nasally intubated by ENT.  Post op she was brought to ICU and PCCM consulted for ICU mgmt.  Pt denies recent medication changes or new medications  She had similar event ~1 year ago requiring intubation. Janina Mayo was considered at the time but ultimately not needed and no clear cause identified.   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   Significant Hospital Events: Including procedures, antibiotic start and stop dates in addition to other pertinent events   6/18 admitted, OR for nasal intubation due to glottic edema  Interim History / Subjective:   Awake and following commands without distress.  Objective   Blood pressure (!) 156/61, pulse 82, temperature  97.7 F (36.5 C), temperature source Axillary, resp. rate 20, height 5\' 2"  (1.575 m), weight 40.5 kg, SpO2 100 %.    Vent Mode: PRVC FiO2 (%):  [40 %] 40 % Set Rate:  [20 bmp] 20 bmp Vt Set:  [400 mL] 400 mL PEEP:  [5 cmH20] 5 cmH20 Plateau Pressure:  [17 cmH20] 17 cmH20   Intake/Output Summary (Last 24 hours) at 03/14/2023 1401 Last data filed at 03/14/2023 1200 Gross per 24 hour  Intake 1107.7 ml  Output --  Net 1107.7 ml    Filed Weights   03/13/23 0745 03/13/23 1219 03/14/23 0500  Weight: 39.1 kg 37 kg 40.5 kg    Examination: General: elderly woman sitting up in bed in NAD HENT: [place]/AT, nasal intubation, lip edema is no longer noticeable  Lungs: CTAB, breathing comfortably on MV , no air leak.  Cardiovascular: S1S2, RRR Abdomen: soft, NT, ND Extremities: no edema or cyanosis Neuro: RASS 0, following commands.  Derm: warm, dry, no rashes    Ancillary tests personally reviewed:   Echo: LVEF 60-65%, no RWMA, normal RV size and function. Normal RV size and function. Degenerative MV, no MR or MS. Normal IVC.  Mild AI.  CT maxillofacial: no dental abscesses, possible left cellulitis of face and neck Blood cultures> strep agalactiae 4/4 Low C1- Esterase   Assessment & Plan:  Subglottic edema/ compromised airway- unclear etiology. S/p fiberoptic nasal intubation by ENT in OR 6/18. Of note had similar previous episode ~1 year ago. With bacteremia question epiglottitis. 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  Post op vent management  Strep bacteremia ESRD on HD DM with hyperglycemia; A1c 9 HTN, well controlled on propofol Chronic moderate protein energy malnutrition Carotid  stenosis on imaging  Plan:  - Daily SBT but will not attempt extubation until clear air leak is present.  - Observe through the weekend.  ENT to trach if no improvement by Monday - Group B strep bacteremia.  Unclear source, relationship to subglottic edema unclear as well unless  latter represents soft tissue infection. -ID following.  Currently on cefazolin with decreasing white count and fever trend.  Needs TEE but unable to pass probe at this time due to upper airway edema.  Some edema surrounding fistula but unlikely source of infection.   Best Practice (right click and "Reselect all SmartList Selections" daily)   Diet/type: tubefeeds DVT prophylaxis: prophylactic heparin  GI prophylaxis: PPI Lines: N/A Foley:  N/A Code Status:  full code Last date of multidisciplinary goals of care discussion []     This patient is critically ill with multiple organ system failure which requires frequent high complexity decision making, assessment, support, evaluation, and titration of therapies. This was completed through the application of advanced monitoring technologies and extensive interpretation of multiple databases. During this encounter critical care time was devoted to patient care services described in this note for 35 minutes.  Lynnell Catalan, MD 03/14/23 2:01 PM [facility]  For contact information, see Amion. If no response to pager, please call PCCM consult pager. After hours, 7PM- 7AM, please call Elink.

## 2023-03-14 NOTE — Progress Notes (Signed)
eLink Physician-Brief Progress Note Patient Name: Susan Evans DOB: Feb 06, 1956 MRN: [id number]   Date of Service  03/14/2023  HPI/Events of Note  Patient on the ventilator and needs restraints to prevent self-extubation.  eICU Interventions  Restraints order renewed.        Susan Evans 03/14/2023, 8:20 PM

## 2023-03-15 ENCOUNTER — Inpatient Hospital Stay (HOSPITAL_COMMUNITY): Payer: 59

## 2023-03-15 LAB — CBC
HCT: 29.8 % — ABNORMAL LOW (ref 36.0–46.0)
Hemoglobin: 9.8 g/dL — ABNORMAL LOW (ref 12.0–15.0)
MCH: 24 pg — ABNORMAL LOW (ref 26.0–34.0)
MCHC: 32.9 g/dL (ref 30.0–36.0)
MCV: 73 fL — ABNORMAL LOW (ref 80.0–100.0)
Platelets: 198 10*3/uL (ref 150–400)
RBC: 4.08 MIL/uL (ref 3.87–5.11)
RDW: 13.9 % (ref 11.5–15.5)
WBC: 16.6 10*3/uL — ABNORMAL HIGH (ref 4.0–10.5)
nRBC: 0.1 % (ref 0.0–0.2)

## 2023-03-15 LAB — GLUCOSE, CAPILLARY
Glucose-Capillary: 167 mg/dL — ABNORMAL HIGH (ref 70–99)
Glucose-Capillary: 156 mg/dL — ABNORMAL HIGH (ref 70–99)
Glucose-Capillary: 157 mg/dL — ABNORMAL HIGH (ref 70–99)
Glucose-Capillary: 65 mg/dL — ABNORMAL LOW (ref 70–99)
Glucose-Capillary: 67 mg/dL — ABNORMAL LOW (ref 70–99)
Glucose-Capillary: 98 mg/dL (ref 70–99)
Glucose-Capillary: 150 mg/dL — ABNORMAL HIGH (ref 70–99)

## 2023-03-15 LAB — BASIC METABOLIC PANEL
Anion gap: 14 (ref 5–15)
BUN: 59 mg/dL — ABNORMAL HIGH (ref 8–23)
CO2: 23 mmol/L (ref 22–32)
Calcium: 8.2 mg/dL — ABNORMAL LOW (ref 8.9–10.3)
Chloride: 93 mmol/L — ABNORMAL LOW (ref 98–111)
Creatinine, Ser: 3.74 mg/dL — ABNORMAL HIGH (ref 0.44–1.00)
GFR, Estimated: 13 mL/min — ABNORMAL LOW (ref >60)
Glucose, Bld: 152 mg/dL — ABNORMAL HIGH (ref 70–99)
Potassium: 4.9 mmol/L (ref 3.5–5.1)
Sodium: 130 mmol/L — ABNORMAL LOW (ref 135–145)

## 2023-03-15 LAB — CULTURE, BLOOD (ROUTINE X 2)

## 2023-03-15 LAB — TRIGLYCERIDES: Triglycerides: 122 mg/dL (ref <150)

## 2023-03-15 MED ORDER — DEXTROSE 50 % IV SOLN: 25.0000 mL | Freq: Once | INTRAVENOUS | Status: DC

## 2023-03-15 MED ORDER — OXIDIZED CELLULOSE EX PADS
1.0000 | MEDICATED_PAD | Freq: Once | CUTANEOUS | Status: DC
  Filled 2023-03-15: qty 1

## 2023-03-15 MED ORDER — IOHEXOL 350 MG/ML SOLN
75.0000 mL | Freq: Once | INTRAVENOUS | Status: AC | PRN
  Administered 2023-03-15: 75 mL via INTRAVENOUS

## 2023-03-15 MED ORDER — THIAMINE MONONITRATE 100 MG PO TABS
100.0000 mg | ORAL_TABLET | Freq: Every day | ORAL | Status: DC
  Administered 2023-03-16 – 2023-03-31 (×15): 100 mg
  Filled 2023-03-15 (×15): qty 1

## 2023-03-15 MED ORDER — DEXTROSE 50 % IV SOLN
INTRAVENOUS | Status: AC
  Filled 2023-03-15: qty 50

## 2023-03-15 MED ORDER — POLYETHYLENE GLYCOL 3350 17 G PO PACK: 17.0000 g | PACK | Freq: Every day | ORAL | Status: DC | PRN

## 2023-03-15 NOTE — Progress Notes (Signed)
   03/15/23 1715  Vitals  Temp 98.1 F (36.7 C)  Pulse Rate 89  Resp 17  BP (!) 141/67  SpO2 100 %  O2 Device Ventilator  Type of Weight Post-Dialysis  Oxygen Therapy  FiO2 (%) 40 %  Patient Activity (if Appropriate) In bed  Pulse Oximetry Type Continuous  Oximetry Probe Site Changed No  Post Treatment  Dialyzer Clearance Lightly streaked  Duration of HD Treatment -hour(s) 3.5 hour(s)  Hemodialysis Intake (mL) 0 mL  Liters Processed 73.5  Fluid Removed (mL) 1700 mL  Tolerated HD Treatment Yes  AVG/AVF Arterial Site Held (minutes) 15 minutes  AVG/AVF Venous Site Held (minutes) 15 minutes   Received patient in bed to unit.  Alert and oriented.  Informed consent signed and in chart.   TX duration:3.5  Patient tolerated well.  Transported back to the room  Alert, without acute distress.  Hand-off given to patient's nurse.   Access used: RUAF Access issues: excessively edema and bleeding  Total UF removed: 1700 Medication(s) given: none   [facility]

## 2023-03-15 NOTE — Progress Notes (Signed)
[facility] Progress Note   Assessment/ Plan:   1 Angioedema: occurred 12/2021 as well.  Both times this has happened have been in the setting of infection but that's the only common link I can identify- and I am not sure that it was the same organism anyway.  D/w PCCM- they will order CT maxillofacial with contrast (fine in ESRD) to look for oropharyngeal nidus of infection in the setting of S agalactiae bacteremia.    - nasally intubated with ENT- still no cuff leak, possible trach  - getting dexamethasone  - C1 esterase inhibitor < 8--> but this can occur with sepsis too so maybe not the primary cause of issue?  Will need to do OP labs, possibly allergy appt too  - pt is already dialyzing on a hypoallergenic dialyzer as an OP (has been for the past year)- will keep this on indefinitely  - HD today- just came back from CT to rule out AVF infection- will make sure this is not the nidus before we do HD again 2.  S agalactiae bacteremia: as above in #1, on cefazolin.  ID following, appreciate 3 ESRD: TTS [facility].  Will provide HD on schedule.  Next HD 6/22 4 Hypertension: reasonably controlled.  UF as tolerated-- BP low, stopped clonidine patch 5. 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 Metabolic Bone Disease:  binders/ vits when eating 7.  Dispo: in ICU for now  Subjective:    Seen in room.  C1 esterase inhibitor resulted really low.     Objective:   BP (!) 146/62   Pulse 72   Temp (!) 97.5 F (36.4 C) (Axillary)   Resp 20   Ht 5\' 2"  (1.575 m)   Wt 40.8 kg   SpO2 100%   BMI 16.45 kg/m   Physical Exam: GEN  NAD, sitting in bed HEENT lower lip improving NECK no JVD PULM mech coarse bilaterally CV RRR ABD soft EXT some nonpitting edema upper extremities NEURO intubated and sedated SKIN no rashes ACCESS: R AVG + T/B  Labs: BMET Recent Labs  Lab 03/11/23 0909 03/11/23 1320 03/11/23 1617 03/12/23 0609 03/12/23 1733 03/13/23 0500 03/13/23 0642  03/13/23 1802 03/14/23 0810  NA 135 132* 132* 135  --   --  127*  --   --   K 3.6 3.8 3.5 3.5  --   --  4.0  --   --   CL 95*  --   --  92*  --   --  91*  --   --   CO2 27  --   --  23  --   --  19*  --   --   GLUCOSE 151*  --   --  115*  --   --  298*  --   --   BUN 24*  --   --  37*  --   --  58*  --   --   CREATININE 3.23*  --   --  3.64*  --   --  4.39*  --   --   CALCIUM 8.8*  --   --  8.7*  --   --  7.6*  --   --   PHOS  --   --   --   --  5.7* 7.0* 6.9* 3.1 3.5   CBC Recent Labs  Lab 03/11/23 0909 03/11/23 1320 03/11/23 1617 03/12/23 0609 03/13/23 0642  WBC 23.0*  --   --  25.3* 17.5*  NEUTROABS 21.3*  --   --   --   --   HGB 11.8* 8.8* 12.9 12.5 9.0*  HCT 37.7 26.0* 38.0 38.1 27.8*  MCV 78.5*  --   --  75.4* 73.5*  PLT 186  --   --  150 153      Medications:     aspirin  81 mg Per Tube Daily   atorvastatin  80 mg Per Tube Daily   Chlorhexidine Gluconate Cloth  6 each Topical Daily   Chlorhexidine Gluconate Cloth  6 each Topical Q0600   dexamethasone (DECADRON) injection  4 mg Intravenous Q6H   docusate  100 mg Per Tube BID   heparin  5,000 Units Subcutaneous Q8H   insulin aspart  0-15 Units Subcutaneous Q4H   insulin glargine-yfgn  15 Units Subcutaneous BID   multivitamin  1 tablet Per Tube QHS   mouth rinse  15 mL Mouth Rinse Q2H   pantoprazole (PROTONIX) IV  40 mg Intravenous QHS   polyethylene glycol  17 g Per Tube Daily   sevelamer carbonate  800 mg Per Tube TID   thiamine (VITAMIN B1) injection  100 mg Intravenous Daily     Bufford Buttner, MD 03/15/2023, 10:09 AM

## 2023-03-15 NOTE — Progress Notes (Signed)
Pt transported from 2M05 to CT01 and back by RN and RT w/o complications

## 2023-03-15 NOTE — Progress Notes (Signed)
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 She also developed SOB and hoarse voice and ENT was consulted. She was ultimately taken to OR and nasally intubated by ENT.  Post op she was brought to ICU and PCCM consulted for ICU mgmt.  Pt denies recent medication changes or new medications  She had similar event ~1 year ago requiring intubation. Janina Mayo was considered at the time but ultimately not needed and no clear cause identified.   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   Significant Hospital Events: Including procedures, antibiotic start and stop dates in addition to other pertinent events   6/18 admitted, OR for nasal intubation due to glottic edema  Interim History / Subjective:   Awake and following commands without distress.  Objective   Blood pressure (!) 146/62, pulse 72, temperature  (!) 97.5 F (36.4 C), temperature source Axillary, resp. rate 20, height 5\' 2"  (1.575 m), weight 40.8 kg, SpO2 100 %.    Vent Mode: PSV;CPAP FiO2 (%):  [40 %] 40 % Set Rate:  [20 bmp] 20 bmp Vt Set:  [400 mL] 400 mL PEEP:  [5 cmH20] 5 cmH20 Pressure Support:  [10 cmH20] 10 cmH20 Plateau Pressure:  [16 cmH20] 16 cmH20   Intake/Output Summary (Last 24 hours) at 03/15/2023 1053 Last data filed at 03/15/2023 0900 Gross per 24 hour  Intake 1245.03 ml  Output --  Net 1245.03 ml    Filed Weights   03/13/23 1219 03/14/23 0500 03/15/23 0500  Weight: 37 kg 40.5 kg 40.8 kg    Examination: General: elderly woman sitting up in bed in NAD HENT: [place]/AT, nasal intubation, lip edema is still present  Lungs: CTAB, breathing comfortably on MV , no air leak still Cardiovascular: S1S2, RRR Abdomen: soft, NT, ND Extremities: no edema or cyanosis.  Right forearm swollen.  Fistula in place but no erythema.  Full range of motion no pain. Neuro: RASS 0, following commands.  Derm: warm, dry, no rashes    Ancillary tests personally reviewed:   Echo: LVEF 60-65%, no RWMA, normal RV size and function. Normal RV size and function. Degenerative MV, no MR or MS. Normal IVC.  Mild AI.  CT maxillofacial: no dental abscesses, possible left cellulitis of face and neck Blood cultures> strep agalactiae 4/4 Low C1- Esterase   Urinalysis negative CT right arm results pending. Assessment & Plan:  Subglottic edema/ compromised airway- unclear etiology. S/p fiberoptic nasal intubation by ENT in OR 6/18. Of note had similar previous episode ~1 year ago. With bacteremia question epiglottitis. Hereditary  angioedema possible with repeated episodes.  Low C-1 esterase and similar angioedema reactions possible with infection. Post op vent management  Group B streptococcal bacteremia source unclear, may be transient due to vascular access.  Rule out fistula infection. ESRD on HD DM with hyperglycemia; A1c 9 HTN, well  controlled on propofol Chronic moderate protein energy malnutrition Carotid stenosis on imaging  Plan:  - Daily SBT but will not attempt extubation until clear air leak is present.  - Observe through the weekend.  ENT to trach if no improvement by Monday - Group B strep bacteremia.  Unclear source, relationship to subglottic edema unclear as well unless latter represents soft tissue infection. -ID following.  Currently on cefazolin with decreasing white count and fever trend.  Needs TEE but unable to pass probe at this time due to upper airway edema.  Some edema surrounding fistula but unlikely source of infection. -Follow-up arm CT results.  Best Practice (right click and "Reselect all SmartList Selections" daily)   Diet/type: tubefeeds DVT prophylaxis: prophylactic heparin  GI prophylaxis: PPI Lines: N/A Foley:  N/A Code Status:  full code Last date of multidisciplinary goals of care discussion [updated at bedside 6/21.]  This patient is critically ill with multiple organ system failure which requires frequent high complexity decision making, assessment, support, evaluation, and titration of therapies. This was completed through the application of advanced monitoring technologies and extensive interpretation of multiple databases. During this encounter critical care time was devoted to patient care services described in this note for 35 minutes.  Lynnell Catalan, MD 03/15/23 10:53 AM [facility]  For contact information, see Amion. If no response to pager, please call PCCM consult pager. After hours, 7PM- 7AM, please call Elink.

## 2023-03-16 ENCOUNTER — Encounter (HOSPITAL_COMMUNITY): Payer: Self-pay | Admitting: Anesthesiology

## 2023-03-16 LAB — BASIC METABOLIC PANEL
Anion gap: 11 (ref 5–15)
BUN: 24 mg/dL — ABNORMAL HIGH (ref 8–23)
CO2: 28 mmol/L (ref 22–32)
Calcium: 8.1 mg/dL — ABNORMAL LOW (ref 8.9–10.3)
Chloride: 93 mmol/L — ABNORMAL LOW (ref 98–111)
Creatinine, Ser: 2.37 mg/dL — ABNORMAL HIGH (ref 0.44–1.00)
GFR, Estimated: 22 mL/min — ABNORMAL LOW (ref >60)
Glucose, Bld: 192 mg/dL — ABNORMAL HIGH (ref 70–99)
Potassium: 4 mmol/L (ref 3.5–5.1)
Sodium: 132 mmol/L — ABNORMAL LOW (ref 135–145)

## 2023-03-16 LAB — CBC
HCT: 31 % — ABNORMAL LOW (ref 36.0–46.0)
Hemoglobin: 10.2 g/dL — ABNORMAL LOW (ref 12.0–15.0)
MCH: 24.5 pg — ABNORMAL LOW (ref 26.0–34.0)
MCHC: 32.9 g/dL (ref 30.0–36.0)
MCV: 74.3 fL — ABNORMAL LOW (ref 80.0–100.0)
Platelets: 174 10*3/uL (ref 150–400)
RBC: 4.17 MIL/uL (ref 3.87–5.11)
RDW: 14.1 % (ref 11.5–15.5)
WBC: 9.8 10*3/uL (ref 4.0–10.5)
nRBC: 0.3 % — ABNORMAL HIGH (ref 0.0–0.2)

## 2023-03-16 LAB — GLUCOSE, CAPILLARY
Glucose-Capillary: 193 mg/dL — ABNORMAL HIGH (ref 70–99)
Glucose-Capillary: 284 mg/dL — ABNORMAL HIGH (ref 70–99)
Glucose-Capillary: 328 mg/dL — ABNORMAL HIGH (ref 70–99)
Glucose-Capillary: 197 mg/dL — ABNORMAL HIGH (ref 70–99)
Glucose-Capillary: 91 mg/dL (ref 70–99)
Glucose-Capillary: 94 mg/dL (ref 70–99)
Glucose-Capillary: 132 mg/dL — ABNORMAL HIGH (ref 70–99)

## 2023-03-16 LAB — CULTURE, BLOOD (ROUTINE X 2): Culture: NO GROWTH

## 2023-03-16 MED ORDER — ACETAMINOPHEN 325 MG PO TABS
650.0000 mg | ORAL_TABLET | Freq: Four times a day (QID) | ORAL | Status: DC | PRN
  Administered 2023-03-17 – 2023-03-30 (×7): 650 mg
  Filled 2023-03-16 (×9): qty 2

## 2023-03-16 MED ORDER — HEPARIN SODIUM (PORCINE) 5000 UNIT/ML IJ SOLN
5000.0000 [IU] | Freq: Two times a day (BID) | INTRAMUSCULAR | Status: DC
  Administered 2023-03-16 – 2023-03-27 (×22): 5000 [IU] via SUBCUTANEOUS
  Filled 2023-03-16 (×22): qty 1

## 2023-03-16 NOTE — Progress Notes (Signed)
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 She also developed SOB and hoarse voice and ENT was consulted. She was ultimately taken to OR and nasally intubated by ENT.  Post op she was brought to ICU and PCCM consulted for ICU mgmt.  Pt denies recent medication changes or new medications  She had similar event ~1 year ago requiring intubation. Janina Mayo was considered at the time but ultimately not needed and no clear cause identified.   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   Significant Hospital Events: Including procedures, antibiotic start and stop dates in addition to other pertinent events   6/18 admitted, OR for nasal intubation due to glottic edema  Interim History / Subjective:   Awake and following commands without distress.  Objective   Blood pressure 125/66, pulse (!) 110, temperature  98.6 F (37 C), temperature source Axillary, resp. rate (!) 24, height 5\' 2"  (1.575 m), weight 38.4 kg, SpO2 100 %.    Vent Mode: PSV;CPAP FiO2 (%):  [40 %] 40 % Set Rate:  [20 bmp] 20 bmp Vt Set:  [400 mL] 400 mL PEEP:  [5 cmH20] 5 cmH20 Pressure Support:  [10 cmH20] 10 cmH20 Plateau Pressure:  [16 cmH20-17 cmH20] 16 cmH20   Intake/Output Summary (Last 24 hours) at 03/16/2023 0907 Last data filed at 03/16/2023 0600 Gross per 24 hour  Intake 963.51 ml  Output 1700 ml  Net -736.49 ml    Filed Weights   03/14/23 0500 03/15/23 0500 03/16/23 0500  Weight: 40.5 kg 40.8 kg 38.4 kg    Examination: General: elderly woman sitting up in bed in NAD HENT: [place]/AT, nasal intubation, lip edema is still present  Lungs: CTAB, breathing comfortably on MV , no air leak still Cardiovascular: S1S2, RRR Abdomen: soft, NT, ND Extremities: no edema or cyanosis.  Right forearm swollen.  Fistula in place but no erythema.  Full range of motion no pain. Neuro: RASS 0, following commands.  Derm: warm, dry, no rashes    Ancillary tests personally reviewed:   Echo: LVEF 60-65%, no RWMA, normal RV size and function. Normal RV size and function. Degenerative MV, no MR or MS. Normal IVC.  Mild AI.  CT maxillofacial: no dental abscesses, possible left cellulitis of face and neck Blood cultures> strep agalactiae 4/4 Low C1- Esterase   Urinalysis negative CT right arm shows no discrete abscess  Assessment & Plan:  Subglottic edema/ compromised airway- unclear etiology. S/p fiberoptic nasal intubation by ENT in OR 6/18. Of note had similar previous episode ~1 year ago.  With bacteremia question epiglottitis. 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  Low C-1 esterase and similar angioedema reactions possible with infection. Post op vent management  Group B streptococcal bacteremia source unclear, may be transient due to vascular access.  Rule out fistula infection. ESRD on HD DM with  hyperglycemia; A1c 9 HTN, well controlled on propofol Chronic moderate protein energy malnutrition Carotid stenosis on imaging  Plan:  - Daily SBT but will not attempt extubation until clear air leak is present.  - Observe through the weekend.  ENT to trach if no improvement by Monday - Group B strep bacteremia.  Unclear source, relationship to subglottic edema unclear as well unless latter represents soft tissue infection. -ID following.  Currently on cefazolin with decreasing white count and fever trend.  Needs TEE but unable to pass probe at this time due to upper airway edema.  Some edema surrounding fistula but unlikely source of infection.   Best Practice (right click and "Reselect all SmartList Selections" daily)   Diet/type: tubefeeds DVT prophylaxis: prophylactic heparin  GI prophylaxis: PPI Lines: N/A Foley:  N/A Code Status:  full code Last date of multidisciplinary goals of care discussion [updated at bedside 6/21.]  This patient is critically ill with multiple organ system failure which requires frequent high complexity decision making, assessment, support, evaluation, and titration of therapies. This was completed through the application of advanced monitoring technologies and extensive interpretation of multiple databases. During this encounter critical care time was devoted to patient care services described in this note for 35 minutes.  Lynnell Catalan, MD 03/16/23 9:07 AM [facility]  For contact information, see Amion. If no response to pager, please call PCCM consult pager. After hours, 7PM- 7AM, please call Elink.

## 2023-03-16 NOTE — Progress Notes (Signed)
[facility] Progress Note   Assessment/ Plan:   1 Angioedema: occurred 12/2021 as well.  Both times this has happened have been in the setting of infection but that's the only common link I can identify- and I am not sure that it was the same organism anyway.  D/w PCCM- they will order CT maxillofacial with contrast (fine in ESRD) to look for oropharyngeal nidus of infection in the setting of S agalactiae bacteremia.    - nasally intubated with ENT- still no cuff leak, possible trach  - getting dexamethasone  - C1 esterase inhibitor < 8--> but this can occur with sepsis too so maybe not the primary cause of issue?  Will need to do OP labs, possibly allergy appt too  - pt is already dialyzing on a hypoallergenic dialyzer as an OP (has been for the past year)- will keep this on indefinitely  - HD 03/15/23- using AVF- no evidence of abscess  - trach planned for Monday.  Please note that if she is trached then she will not be able to go back to her OP HD unit and will need a facility that takes fresh trachs and dialysis- this is usually an LTACH 2.  S agalactiae bacteremia: as above in #1, on cefazolin.  ID following, appreciate  TEE suggested 3 ESRD: TTS [facility].  Will provide HD on schedule. HD 6/22, Next Tuesday 6/25 4 Hypertension: reasonably controlled.  UF as tolerated-- BP low, stopped clonidine patch 5. 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 Metabolic Bone Disease:  binders/ vits when eating 7.  Dispo: in ICU for now  Subjective:    HD yesterday, did OK- CT arm didn't show abscess.  Still no cuff leak   Objective:   BP 119/63   Pulse (!) 110   Temp 99.1 F (37.3 C) (Oral)   Resp (!) 22   Ht 5\' 2"  (1.575 m)   Wt 38.4 kg   SpO2 100%   BMI 15.48 kg/m   Physical Exam: GEN  NAD, lygin in bed, sleeping HEENT lower lip improving NECK no JVD PULM mech coarse bilaterally CV RRR ABD soft EXT some nonpitting edema upper extremities NEURO intubated and sedated SKIN  no rashes ACCESS: R AVG + T/B  Labs: BMET Recent Labs  Lab 03/11/23 0909 03/11/23 1320 03/11/23 1617 03/12/23 0609 03/12/23 1733 03/13/23 0500 03/13/23 0642 03/13/23 1802 03/14/23 0810 03/15/23 1327 03/16/23 0122  NA 135 132* 132* 135  --   --  127*  --   --  130* 132*  K 3.6 3.8 3.5 3.5  --   --  4.0  --   --  4.9 4.0  CL 95*  --   --  92*  --   --  91*  --   --  93* 93*  CO2 27  --   --  23  --   --  19*  --   --  23 28  GLUCOSE 151*  --   --  115*  --   --  298*  --   --  152* 192*  BUN 24*  --   --  37*  --   --  58*  --   --  59* 24*  CREATININE 3.23*  --   --  3.64*  --   --  4.39*  --   --  3.74* 2.37*  CALCIUM 8.8*  --   --  8.7*  --   --  7.6*  --   --  8.2* 8.1*  PHOS  --   --   --   --  5.7* 7.0* 6.9* 3.1 3.5  --   --    CBC Recent Labs  Lab 03/11/23 0909 03/11/23 1320 03/12/23 0609 03/13/23 0642 03/15/23 1327 03/16/23 0122  WBC 23.0*  --  25.3* 17.5* 16.6* 9.8  NEUTROABS 21.3*  --   --   --   --   --   HGB 11.8*   < > 12.5 9.0* 9.8* 10.2*  HCT 37.7   < > 38.1 27.8* 29.8* 31.0*  MCV 78.5*  --  75.4* 73.5* 73.0* 74.3*  PLT 186  --  150 153 198 174   < > = values in this interval not displayed.      Medications:     aspirin  81 mg Per Tube Daily   atorvastatin  80 mg Per Tube Daily   Chlorhexidine Gluconate Cloth  6 each Topical Daily   dextrose  25 mL Intravenous Once   docusate  100 mg Per Tube BID   heparin  5,000 Units Subcutaneous Q8H   insulin aspart  0-15 Units Subcutaneous Q4H   insulin glargine-yfgn  15 Units Subcutaneous BID   multivitamin  1 tablet Per Tube QHS   mouth rinse  15 mL Mouth Rinse Q2H   oxidized cellulose  1 each Topical Once   pantoprazole (PROTONIX) IV  40 mg Intravenous QHS   sevelamer carbonate  800 mg Per Tube TID   thiamine  100 mg Per Tube Daily     Bufford Buttner, MD 03/16/2023, 7:51 AM

## 2023-03-16 NOTE — Progress Notes (Signed)
ID brief Note ( cross coverage over weekend)  03/15/23 CT rt upper extremity  IMPRESSION: 1. Diffuse soft tissue edema throughout the right upper extremity, involving both the superficial and deep spaces, most pronounced at the level of the elbow, forearm, and particularly of the palm of the hand. No discrete fluid collection nor focal contrast enhancement identified. 2. Patent brachiocephalic arteriovenous fistula. 3. Scattered atherosclerosis of the right axillary artery. Patent right brachial and profunda brachii arteries. Limited opacification of the brachial artery radial, ulnar, and interosseous branch vessels due to fistula shunting, however these appear to be patent to the level of the wrist.   Aortic Atherosclerosis (ICD10-I70.0).  6/19 Blood cx NG in 4 days On cefazolin   Dr Thedore Mins back from tomorrow   Odette Fraction, MD Infectious Disease Physician [facility] [address] [place], [place] [address] Phone: [phone]  Fax: [phone]

## 2023-03-17 ENCOUNTER — Encounter (HOSPITAL_COMMUNITY)
Admission: EM | Disposition: E | Payer: Self-pay | Source: Home / Self Care | Attending: Student in an Organized Health Care Education/Training Program

## 2023-03-17 DIAGNOSIS — B951 Streptococcus, group B, as the cause of diseases classified elsewhere: Secondary | ICD-10-CM | POA: Diagnosis not present

## 2023-03-17 DIAGNOSIS — J384 Edema of larynx: Secondary | ICD-10-CM | POA: Diagnosis not present

## 2023-03-17 DIAGNOSIS — R7881 Bacteremia: Secondary | ICD-10-CM | POA: Diagnosis present

## 2023-03-17 LAB — GLUCOSE, CAPILLARY
Glucose-Capillary: 36 mg/dL — CL (ref 70–99)
Glucose-Capillary: 43 mg/dL — CL (ref 70–99)
Glucose-Capillary: 192 mg/dL — ABNORMAL HIGH (ref 70–99)
Glucose-Capillary: 101 mg/dL — ABNORMAL HIGH (ref 70–99)
Glucose-Capillary: 72 mg/dL (ref 70–99)
Glucose-Capillary: 196 mg/dL — ABNORMAL HIGH (ref 70–99)
Glucose-Capillary: 133 mg/dL — ABNORMAL HIGH (ref 70–99)
Glucose-Capillary: 125 mg/dL — ABNORMAL HIGH (ref 70–99)

## 2023-03-17 LAB — SEDIMENTATION RATE: Sed Rate: 48 mm/hr — ABNORMAL HIGH (ref 0–22)

## 2023-03-17 LAB — CULTURE, BLOOD (ROUTINE X 2)

## 2023-03-17 SURGERY — CREATION, TRACHEOSTOMY
Anesthesia: Choice

## 2023-03-17 MED ORDER — MIDAZOLAM HCL 2 MG/2ML IJ SOLN
INTRAMUSCULAR | Status: AC
  Filled 2023-03-17: qty 2

## 2023-03-17 MED ORDER — DEXTROSE 10 % IV SOLN: INTRAVENOUS | Status: DC

## 2023-03-17 MED ORDER — DOXERCALCIFEROL 4 MCG/2ML IV SOLN
2.0000 ug | INTRAVENOUS | Status: DC
  Administered 2023-03-18: 2 ug via INTRAVENOUS
  Filled 2023-03-17 (×5): qty 2

## 2023-03-17 MED ORDER — CHLORHEXIDINE GLUCONATE CLOTH 2 % EX PADS: 6.0000 | MEDICATED_PAD | Freq: Every day | CUTANEOUS | Status: DC

## 2023-03-17 MED ORDER — ROCURONIUM BROMIDE 10 MG/ML (PF) SYRINGE
PREFILLED_SYRINGE | INTRAVENOUS | Status: AC
  Filled 2023-03-17: qty 10

## 2023-03-17 MED ORDER — FENTANYL CITRATE PF 50 MCG/ML IJ SOSY
PREFILLED_SYRINGE | INTRAMUSCULAR | Status: AC
  Filled 2023-03-17: qty 2

## 2023-03-17 MED ORDER — ETOMIDATE 2 MG/ML IV SOLN: 10.0000 mg | Freq: Once | INTRAVENOUS | Status: AC

## 2023-03-17 MED ORDER — ETOMIDATE 2 MG/ML IV SOLN
INTRAVENOUS | Status: AC
  Filled 2023-03-17: qty 20

## 2023-03-17 MED ORDER — DEXAMETHASONE SODIUM PHOSPHATE 4 MG/ML IJ SOLN
4.0000 mg | Freq: Once | INTRAMUSCULAR | Status: AC
  Administered 2023-03-17: 4 mg via INTRAVENOUS
  Filled 2023-03-17: qty 1

## 2023-03-17 MED ORDER — ETOMIDATE 2 MG/ML IV SOLN
INTRAVENOUS | Status: AC
  Administered 2023-03-17: 20 mg via INTRAVENOUS
  Filled 2023-03-17: qty 10

## 2023-03-17 MED ORDER — DEXTROSE 50 % IV SOLN
INTRAVENOUS | Status: AC
  Filled 2023-03-17: qty 50

## 2023-03-17 NOTE — Progress Notes (Signed)
[facility]  Date of Admission:  03/11/2023     Total days of antibiotics 7         ASSESSMENT:  Susan Evans continues to improve in the setting the Group B Streptococcus bacteremia complicated by glottic edema which is also improving. Blood cultures from 03/12/23 finalized without growth confirming clearance of bacteremia. Agree with CCM assessment of risk outweighing benefit of TEE at this point and will treat for 6 weeks from 03/12/23 with Cefazolin administered with dialysis which will also provide vascular coverage. End date of treatment tentatively 04/23/23. Will arrange for hospital follow up in ID clinic. Remaining medical and supportive care per CCM.   PLAN:  Continue current dose of Cefazolin through 04/23/23 given with dialysis.  Risk outweighs benefit of TEE at this point.  Hospital follow up in ID clinic.  Respiratory and remaining medical and supportive care per CCM.   I have personally spent 26 minutes involved in face-to-face and non-face-to-face activities for this patient on the day of the visit. Professional time spent includes the following activities: Preparing to see the patient (review of tests), Obtaining and/or reviewing separately obtained history (admission/discharge record), Performing a medically appropriate examination and/or evaluation , Ordering medications/tests/procedures, referring and communicating with other health care professionals, Documenting clinical information in the EMR, Independently interpreting results (not separately reported), Communicating results to the patient/family/caregiver, Counseling and educating the patient/family/caregiver and Care coordination (not separately reported).    Principal Problem:   Status post tracheostomy [facility]) Active Problems:   Bacteremia due to group B Streptococcus    aspirin  81 mg Per Tube Daily   atorvastatin  80 mg Per Tube Daily   Chlorhexidine Gluconate Cloth  6 each Topical  Daily   docusate  100 mg Per Tube BID   [START ON 03/18/2023] doxercalciferol  2 mcg Intravenous Q T,Th,Sa-HD   heparin  5,000 Units Subcutaneous Q12H   insulin aspart  0-15 Units Subcutaneous Q4H   multivitamin  1 tablet Per Tube QHS   mouth rinse  15 mL Mouth Rinse Q2H   pantoprazole (PROTONIX) IV  40 mg Intravenous QHS   sevelamer carbonate  800 mg Per Tube TID   thiamine  100 mg Per Tube Daily    SUBJECTIVE:  Afebrile overnight with no acute events. Improvement in edema. Brother visiting.   Allergies  Allergen Reactions   Hydrochlorothiazide Itching and Other (See Comments)    Hyponatremia    Penicillins Other (See Comments)    "Takes out all of her hair"     Review of Systems: Review of Systems  Unable to perform ROS: Intubated      OBJECTIVE: Vitals:   03/17/23 1100 03/17/23 1125 03/17/23 1135 03/17/23 1230  BP: (!) 141/66   117/87  Pulse: (!) 116   (!) 116  Resp: 20   16  Temp:   (!) 97.2 F (36.2 C)   TempSrc:   Axillary   SpO2: 97% 95%  95%  Weight:      Height:       Body mass index is 15.56 kg/m.  Physical Exam Constitutional:      General: She is not in acute distress.    Appearance: She is well-developed and underweight.  Cardiovascular:     Rate and Rhythm: Normal rate and regular rhythm.     Heart sounds: Normal heart sounds.  Pulmonary:     Effort: Pulmonary effort is normal.     Breath sounds: Normal breath  sounds.  Skin:    General: Skin is warm and dry.  Neurological:     Mental Status: She is alert.     Lab Results Lab Results  Component Value Date   WBC 9.8 03/16/2023   HGB 10.2 (L) 03/16/2023   HCT 31.0 (L) 03/16/2023   MCV 74.3 (L) 03/16/2023   PLT 174 03/16/2023    Lab Results  Component Value Date   CREATININE 2.37 (H) 03/16/2023   BUN 24 (H) 03/16/2023   NA 132 (L) 03/16/2023   K 4.0 03/16/2023   CL 93 (L) 03/16/2023   CO2 28 03/16/2023    Lab Results  Component Value Date   ALT 45 (H) 03/11/2023   AST 56  (H) 03/11/2023   ALKPHOS 160 (H) 03/11/2023   BILITOT 0.5 03/11/2023     Microbiology: Recent Results (from the past 240 hour(s))  Blood Culture (routine x 2)     Status: Abnormal   Collection Time: 03/11/23  9:25 AM   Specimen: BLOOD LEFT FOREARM  Result Value Ref Range Status   Specimen Description BLOOD LEFT FOREARM  Final   Special Requests   Final    BOTTLES DRAWN AEROBIC AND ANAEROBIC Blood Culture results may not be optimal due to an inadequate volume of blood received in culture bottles   Culture  Setup Time   Final    GRAM POSITIVE COCCI IN BOTH AEROBIC AND ANAEROBIC BOTTLES CRITICAL VALUE NOTED.  VALUE IS CONSISTENT WITH PREVIOUSLY REPORTED AND CALLED VALUE.    Culture (A)  Final    GROUP B STREP(S.AGALACTIAE)ISOLATED SUSCEPTIBILITIES PERFORMED ON PREVIOUS CULTURE WITHIN THE LAST 5 DAYS. Performed at [facility], [address] [place], [place] [address]    Report Status 03/13/2023 FINAL  Final  Blood Culture (routine x 2)     Status: Abnormal   Collection Time: 03/11/23  9:31 AM   Specimen: BLOOD  Result Value Ref Range Status   Specimen Description BLOOD LEFT ANTECUBITAL  Final   Special Requests   Final    BOTTLES DRAWN AEROBIC AND ANAEROBIC Blood Culture adequate volume   Culture  Setup Time   Final    GRAM POSITIVE COCCI IN BOTH AEROBIC AND ANAEROBIC BOTTLES CRITICAL RESULT CALLED TO, READ BACK BY AND VERIFIED WITH: PHARMD G. ABBOTT 03/11/23 @ 2257 BY AB Performed at [facility], [address] [place], [place] [address]    Culture GROUP B STREP(S.AGALACTIAE)ISOLATED (A)  Final   Report Status 03/13/2023 FINAL  Final   Organism ID, Bacteria GROUP B STREP(S.AGALACTIAE)ISOLATED  Final      Susceptibility   Group b strep(s.agalactiae)isolated - MIC*    CLINDAMYCIN 0.5 INTERMEDIATE Intermediate     AMPICILLIN <=0.25 SENSITIVE Sensitive     ERYTHROMYCIN <=0.12 SENSITIVE Sensitive     VANCOMYCIN 0.5 SENSITIVE Sensitive     CEFTRIAXONE <=0.12  SENSITIVE Sensitive     LEVOFLOXACIN 1 SENSITIVE Sensitive     PENICILLIN 0.12 SENSITIVE Sensitive     * GROUP B STREP(S.AGALACTIAE)ISOLATED  Blood Culture ID Panel (Reflexed)     Status: Abnormal   Collection Time: 03/11/23  9:31 AM  Result Value Ref Range Status   Enterococcus faecalis NOT DETECTED NOT DETECTED Final   Enterococcus Faecium NOT DETECTED NOT DETECTED Final   Listeria monocytogenes NOT DETECTED NOT DETECTED Final   Staphylococcus species NOT DETECTED NOT DETECTED Final   Staphylococcus aureus (BCID) NOT DETECTED NOT DETECTED Final   Staphylococcus epidermidis NOT DETECTED NOT DETECTED Final  Staphylococcus lugdunensis NOT DETECTED NOT DETECTED Final   Streptococcus species DETECTED (A) NOT DETECTED Final    Comment: CRITICAL RESULT CALLED TO, READ BACK BY AND VERIFIED WITH: PHARMD G. ABBOTT 03/11/23 @ 2257 BY AB    Streptococcus agalactiae DETECTED (A) NOT DETECTED Final    Comment: CRITICAL RESULT CALLED TO, READ BACK BY AND VERIFIED WITH: PHARMD G. ABBOTT 03/11/23 @ 2257 BY AB    Streptococcus pneumoniae NOT DETECTED NOT DETECTED Final   Streptococcus pyogenes NOT DETECTED NOT DETECTED Final   A.calcoaceticus-baumannii NOT DETECTED NOT DETECTED Final   Bacteroides fragilis NOT DETECTED NOT DETECTED Final   Enterobacterales NOT DETECTED NOT DETECTED Final   Enterobacter cloacae complex NOT DETECTED NOT DETECTED Final   Escherichia coli NOT DETECTED NOT DETECTED Final   Klebsiella aerogenes NOT DETECTED NOT DETECTED Final   Klebsiella oxytoca NOT DETECTED NOT DETECTED Final   Klebsiella pneumoniae NOT DETECTED NOT DETECTED Final   Proteus species NOT DETECTED NOT DETECTED Final   Salmonella species NOT DETECTED NOT DETECTED Final   Serratia marcescens NOT DETECTED NOT DETECTED Final   Haemophilus influenzae NOT DETECTED NOT DETECTED Final   Neisseria meningitidis NOT DETECTED NOT DETECTED Final   Pseudomonas aeruginosa NOT DETECTED NOT DETECTED Final    Stenotrophomonas maltophilia NOT DETECTED NOT DETECTED Final   Candida albicans NOT DETECTED NOT DETECTED Final   Candida auris NOT DETECTED NOT DETECTED Final   Candida glabrata NOT DETECTED NOT DETECTED Final   Candida krusei NOT DETECTED NOT DETECTED Final   Candida parapsilosis NOT DETECTED NOT DETECTED Final   Candida tropicalis NOT DETECTED NOT DETECTED Final   Cryptococcus neoformans/gattii NOT DETECTED NOT DETECTED Final    Comment: Performed at [facility], [address] [place], [place] [address]  MRSA Next Gen by PCR, Nasal     Status: None   Collection Time: 03/11/23  3:10 PM   Specimen: Nasal Mucosa; Nasal Swab  Result Value Ref Range Status   MRSA by PCR Next Gen NOT DETECTED NOT DETECTED Final    Comment: (NOTE) The GeneXpert MRSA Assay (FDA approved for NASAL specimens only), is one component of a comprehensive MRSA colonization surveillance program. It is not intended to diagnose MRSA infection nor to guide or monitor treatment for MRSA infections. Test performance is not FDA approved in patients less than 66 years old. Performed at [facility], [address], [place], [place] [address]   Culture, blood (Routine X 2) w Reflex to ID Panel     Status: None   Collection Time: 03/12/23 10:14 AM   Specimen: BLOOD LEFT HAND  Result Value Ref Range Status   Specimen Description BLOOD LEFT HAND  Final   Special Requests   Final    BOTTLES DRAWN AEROBIC ONLY Blood Culture results may not be optimal due to an inadequate volume of blood received in culture bottles   Culture   Final    NO GROWTH 5 DAYS Performed at [facility], [address] [place], [place] [address]    Report Status 03/17/2023 FINAL  Final  Culture, blood (Routine X 2) w Reflex to ID Panel     Status: None   Collection Time: 03/12/23 10:23 AM   Specimen: BLOOD LEFT HAND  Result Value Ref Range Status   Specimen Description BLOOD LEFT HAND  Final   Special Requests   Final     BOTTLES DRAWN AEROBIC ONLY Blood Culture results may not be optimal due to  an inadequate volume of blood received in culture bottles   Culture   Final    NO GROWTH 5 DAYS Performed at [facility], [address] [place], [place] [address]    Report Status 03/17/2023 FINAL  Final     Marcos Eke, NP [facility]  03/17/2023  1:01 PM

## 2023-03-17 NOTE — Progress Notes (Signed)
Pt placed on PS/CPAP 10/5 on 40% and is tolerating well. RT will monitor. 

## 2023-03-17 NOTE — Progress Notes (Signed)
03/17/2023   Etomidate 20mg  given  Video laryngoscope easily able to visualize glottis, cords remain slightly swollen; think oral reintubation should be achievable; will work on SBT/SAT and potential extubation.  Plan discussed with ENT  Myrla Halsted MD PCCM

## 2023-03-17 NOTE — Progress Notes (Signed)
[facility] Progress Note  Subjective: seen in ICU, alert, intubated, follows simple commands  Vitals:   03/17/23 1030 03/17/23 1100 03/17/23 1125 03/17/23 1135  BP: (!) 148/69 (!) 141/66    Pulse: (!) 114 (!) 116    Resp: (!) 23 20    Temp:    (!) 97.2 F (36.2 C)  TempSrc:    Axillary  SpO2: 96% 97% 95%   Weight:      Height:        Exam: GEN  NAD, awake, intubated nasally HEENT lower lip improving NECK no JVD PULM on vent, CTA ant/ lat CV RRR ABD soft EXT minimal edema extremities SKIN no rashes ACCESS: R AVG + T/B       Home meds: lipitor, prilosec, renvela 800 mg ac tid, clonidine patch 0.2 weekly, hydralazine 50 bid, verapamil 180 CR 2 tabs at bedtime, prns/ vits/ supps   OP HD: TTS [facility]     4h  350/500   36kg  3K/2.5Ca bath  RUA AVF  Hep ?  - last OP HD 6/18, post wt 38.4  - hectorol 2 mcg IV three times per week  - coming off at dry wt most of last 2 wks   6/21 CXR - no active disease   Assessment/ Plan Angioedema: occurred 12/2021 as well.  Both times this has happened have been in the setting of infection but that's the only common link, not sure that it was the same organism anyway.  CT maxillofacial showed soft tissue swelling/ cellulitis w/o abscess.  No sign of AVF infection on exam.  Pt required nasal intubation by ENT. Pt is getting dexamethasone. C1 esterase inhibitor < 8, but this can occur with sepsis too. Will need OP labs and possibly allergy appt. Pt is already dialyzing on a hypoallergenic dialyzer at OP dialysis for the past year. Will keep this on indefinitely.  F/u video laryngoscope done today per CCM shows improved cords, should be able to tolerate oral reintubation if needed. Extubation is being considered. ESRD - TTS HD. Next HD tomorrow.  S agalactiae bacteremia: as above in #1, on cefazolin.  ID following, appreciate assistance HTN/ vol: Clonidine patch stopped for low BP's, BP's back up in reasonable range. Wt's are stable  1-2kg over, euvolemic on exam. UF 2-2.5 w/ next HD.  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 Resume binders/ vits when eating. Cont IV vdra    Rob Arlean Hopping MD  [facility] 03/17/2023, 12:16 PM  Recent Labs  Lab 03/11/23 0909 03/11/23 1320 03/13/23 0642 03/13/23 1802 03/14/23 0810 03/15/23 1327 03/16/23 0122  HGB 11.8*   < > 9.0*  --   --  9.8* 10.2*  ALBUMIN 3.3*  --  2.0*  --   --   --   --   CALCIUM 8.8*   < > 7.6*  --   --  8.2* 8.1*  PHOS  --    < > 6.9* 3.1 3.5  --   --   CREATININE 3.23*   < > 4.39*  --   --  3.74* 2.37*  K 3.6   < > 4.0  --   --  4.9 4.0   < > = values in this interval not displayed.   No results for input(s): "IRON", "TIBC", "FERRITIN" in the last 168 hours. Inpatient medications:  aspirin  81 mg Per Tube Daily   atorvastatin  80 mg Per Tube Daily  Chlorhexidine Gluconate Cloth  6 each Topical Daily   docusate  100 mg Per Tube BID   [START ON 03/18/2023] doxercalciferol  2 mcg Intravenous Q T,Th,Sa-HD   heparin  5,000 Units Subcutaneous Q12H   insulin aspart  0-15 Units Subcutaneous Q4H   multivitamin  1 tablet Per Tube QHS   mouth rinse  15 mL Mouth Rinse Q2H   pantoprazole (PROTONIX) IV  40 mg Intravenous QHS   sevelamer carbonate  800 mg Per Tube TID   thiamine  100 mg Per Tube Daily    sodium chloride Stopped (03/13/23 1623)    ceFAZolin (ANCEF) IV Stopped (03/16/23 2237)   dextrose 40 mL/hr at 03/17/23 1000   feeding supplement (OSMOLITE 1.5 CAL) Stopped (03/17/23 0001)   sodium chloride, acetaminophen, fentaNYL (SUBLIMAZE) injection, fentaNYL (SUBLIMAZE) injection, heparin, hydrALAZINE, labetalol, lidocaine (PF), lidocaine-prilocaine, mouth rinse, pentafluoroprop-tetrafluoroeth, polyethylene glycol

## 2023-03-17 NOTE — Anesthesia Preprocedure Evaluation (Deleted)
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 She also developed SOB and hoarse voice and ENT was consulted. She was ultimately taken to OR and nasally intubated by ENT.           Cardiovascular hypertension, Pt. on medications   Echo 02/2023  1. Left ventricular ejection fraction, by estimation, is 60 to 65%. The  left ventricle has normal function. The left ventricle has no regional  wall motion abnormalities. Left ventricular diastolic parameters are  consistent with Grade I diastolic  dysfunction (impaired relaxation).   2. Right ventricular systolic function is normal. The right ventricular  size is normal. There is normal pulmonary artery systolic pressure. The  estimated right ventricular systolic pressure is 20.1 mmHg.   3. The mitral valve is degenerative. No evidence of mitral valve  regurgitation. No evidence of mitral stenosis. Moderate mitral annular  calcification.   4. The aortic valve was not well visualized. Aortic valve regurgitation  is mild. Aortic valve sclerosis/calcification is present, without any  evidence of aortic stenosis. Aortic valve Vmax measures 1.16 m/s.   5. The inferior vena cava is normal in size with greater than 50%  respiratory variability, suggesting right atrial pressure of 3 mmHg.     Neuro/Psych negative neurological ROS  negative psych ROS   GI/Hepatic Neg liver ROS,GERD  Controlled,,  Endo/Other  diabetes, Poorly Controlled, Type 2, Insulin Dependent  A1c 9  Renal/GU ESRF and  DialysisRenal diseaseT/Th/Sat  negative genitourinary   Musculoskeletal negative musculoskeletal ROS (+)    Abdominal   Peds  Hematology  (+) Blood dyscrasia, anemia Hb 10.2, plt 174   Anesthesia Other Findings   Reproductive/Obstetrics negative OB ROS                              Anesthesia Physical Anesthesia Plan Anesthesia Quick Evaluation

## 2023-03-17 NOTE — Progress Notes (Signed)
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 She also developed SOB and hoarse voice and ENT was consulted. She was ultimately taken to OR and nasally intubated by ENT.  Post op she was brought to ICU and PCCM consulted for ICU mgmt.  Pt denies recent medication changes or new medications  She had similar event ~1 year ago requiring intubation. Janina Mayo was considered at the time but ultimately not needed and no clear cause identified.   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   Significant Hospital Events: Including procedures, antibiotic start and stop dates in addition to other pertinent events   6/18 admitted, OR for nasal intubation due to glottic edema  Interim History / Subjective:  Possibly has a cuff leak?  Objective   Blood pressure (!) 181/70, pulse 97, temperature 98.8 F (37.1 C),  temperature source Oral, resp. rate (!) 21, height 5\' 2"  (1.575 m), weight 38.6 kg, SpO2 (!) 86 %.    Vent Mode: PRVC FiO2 (%):  [40 %] 40 % Set Rate:  [20 bmp] 20 bmp Vt Set:  [400 mL] 400 mL PEEP:  [5 cmH20] 5 cmH20 Plateau Pressure:  [15 cmH20] 15 cmH20   Intake/Output Summary (Last 24 hours) at 03/17/2023 0735 Last data filed at 03/17/2023 0600 Gross per 24 hour  Intake 929.59 ml  Output --  Net 929.59 ml    Filed Weights   03/15/23 0500 03/16/23 0500 03/17/23 0500  Weight: 40.8 kg 38.4 kg 38.6 kg    Examination: Chronically ill appearing woman laying in bed +muscle wasting Moves to command Thick secretions I cannot hear a cuff leak but will test again with RT She does have some oropharyngeal swelling but think intubation visualization should be fine Lungs scattered rhonci Heart sounds tachy, ext warm  Hypoglycemia noted now on D10  Ancillary tests personally reviewed:   Echo: LVEF 60-65%, no RWMA, normal RV size and function. Normal RV size and function. Degenerative MV, no MR or MS. Normal IVC.  Mild AI.  CT maxillofacial: no dental abscesses, possible left cellulitis of face and neck Blood cultures> strep agalactiae 4/4 Low C1- Esterase   Urinalysis negative CT right arm shows no discrete abscess  Assessment & Plan:  Subglottic edema/ compromised airway- unclear etiology. S/p fiberoptic nasal intubation by ENT in OR 6/18. Of note had similar previous episode ~1 year ago. With bacteremia question epiglottitis. 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   Low C-1 esterase and similar angioedema reactions possible with infection. Group B streptococcal bacteremia source unclear, may be  transient due to vascular access.  Rule out fistula infection.  Consideration for TEE but cannot pass scope at this time due to upper airway edema ESRD on HD DM with hypoglycemia; A1c 9 HTN, well controlled on propofol Chronic moderate protein energy malnutrition Carotid  stenosis on imaging  Plan:  - Continue cefazolin; given risk/benefit of TEE I would probably just treat for IE - iHD per nephrology - Wean off D10 and hold glargine for now - Will touch base w/ ENT and RT; if has a leak I think we give shot at extubation after looking with laryngoscopy to assure reintubation won't be an issue  Best Practice (right click and "Reselect all SmartList Selections" daily)   Diet/type: on hold for possible OR DVT prophylaxis: prophylactic heparin  GI prophylaxis: PPI Lines: N/A Foley:  N/A Code Status:  full code Last date of multidisciplinary goals of care discussion [updated at bedside 6/24.]  32 min cc time  Lorin Glass, MD 03/17/23 7:35 AM [facility]  For contact information, see Amion. If no response to pager, please call PCCM consult pager. After hours, 7PM- 7AM, please call Elink.

## 2023-03-17 NOTE — Progress Notes (Signed)
Positive cuff leak noted with MD at bedside.

## 2023-03-17 NOTE — Progress Notes (Signed)
PHARMACY CONSULT NOTE FOR:  OUTPATIENT  PARENTERAL ANTIBIOTIC THERAPY (OPAT)  Indication: GBS vascular infxn Regimen: Cefazolin 2g/HD-TTS End date: 04/23/23 (6 weeks from negative BCx 03/12/23)  IV antibiotic discharge orders are pended. To discharging provider:  please sign these orders via discharge navigator,  Select New Orders & click on the button choice - Manage This Unsigned Work.     Thank you for allowing pharmacy to be a part of this patient's care.  Georgina Pillion, PharmD, BCPS, BCIDP Infectious Diseases Clinical Pharmacist 03/17/2023 3:19 PM   **Pharmacist phone directory can now be found on [email or web address] ([email or web address]).  Listed under [facility].

## 2023-03-17 NOTE — Progress Notes (Signed)
eLink Physician-Brief Progress Note Patient Name: Susan Evans DOB: 05/19/1956 MRN: [id number]   Date of Service  03/17/2023  HPI/Events of Note  Notified of hypoglycemia,  given D50 per protocol.  She did receive her dose of lantus 15 untislast night.   eICU Interventions  Will start on D10 infusion.  Will continue to trend serial glucose and make further adjsutments as needed.         Lanise Mergen M DELA CRUZ 03/17/2023, 3:27 AM

## 2023-03-17 NOTE — Procedures (Signed)
Extubation Procedure Note  Patient Details:   Name: Susan Evans DOB: 1956/08/25 MRN: [id number]   Airway Documentation:    Vent end date: 03/17/23 Vent end time: 1125   Evaluation  O2 sats: stable throughout Complications: No apparent complications Patient did tolerate procedure well. Bilateral Breath Sounds: Clear   Yes   Pt extubated to 5l [place] with RN and CCM at bedside. Positive cuff leak noted and pt is tolerating well. RT will monitor.  Lajuan Lines 03/17/2023, 12:00 PM

## 2023-03-18 ENCOUNTER — Inpatient Hospital Stay (HOSPITAL_COMMUNITY): Payer: 59

## 2023-03-18 DIAGNOSIS — Z930 Tracheostomy status: Secondary | ICD-10-CM | POA: Diagnosis not present

## 2023-03-18 LAB — GLUCOSE, CAPILLARY
Glucose-Capillary: 134 mg/dL — ABNORMAL HIGH (ref 70–99)
Glucose-Capillary: 92 mg/dL (ref 70–99)
Glucose-Capillary: 190 mg/dL — ABNORMAL HIGH (ref 70–99)
Glucose-Capillary: 163 mg/dL — ABNORMAL HIGH (ref 70–99)
Glucose-Capillary: 157 mg/dL — ABNORMAL HIGH (ref 70–99)
Glucose-Capillary: 134 mg/dL — ABNORMAL HIGH (ref 70–99)

## 2023-03-18 LAB — RENAL FUNCTION PANEL
Albumin: 2.4 g/dL — ABNORMAL LOW (ref 3.5–5.0)
Anion gap: 14 (ref 5–15)
BUN: 54 mg/dL — ABNORMAL HIGH (ref 8–23)
CO2: 27 mmol/L (ref 22–32)
Calcium: 8.9 mg/dL (ref 8.9–10.3)
Chloride: 93 mmol/L — ABNORMAL LOW (ref 98–111)
Creatinine, Ser: 4.57 mg/dL — ABNORMAL HIGH (ref 0.44–1.00)
GFR, Estimated: 10 mL/min — ABNORMAL LOW (ref >60)
Glucose, Bld: 175 mg/dL — ABNORMAL HIGH (ref 70–99)
Phosphorus: 4.2 mg/dL (ref 2.5–4.6)
Potassium: 4.7 mmol/L (ref 3.5–5.1)
Sodium: 134 mmol/L — ABNORMAL LOW (ref 135–145)

## 2023-03-18 LAB — CBC
HCT: 30.3 % — ABNORMAL LOW (ref 36.0–46.0)
Hemoglobin: 9.7 g/dL — ABNORMAL LOW (ref 12.0–15.0)
MCH: 23.8 pg — ABNORMAL LOW (ref 26.0–34.0)
MCHC: 32 g/dL (ref 30.0–36.0)
MCV: 74.3 fL — ABNORMAL LOW (ref 80.0–100.0)
Platelets: 183 10*3/uL (ref 150–400)
RBC: 4.08 MIL/uL (ref 3.87–5.11)
RDW: 14.3 % (ref 11.5–15.5)
WBC: 13.3 10*3/uL — ABNORMAL HIGH (ref 4.0–10.5)
nRBC: 0 % (ref 0.0–0.2)

## 2023-03-18 MED ORDER — POLYETHYLENE GLYCOL 3350 17 G PO PACK
17.0000 g | PACK | Freq: Every day | ORAL | Status: DC
  Administered 2023-03-18 – 2023-03-31 (×11): 17 g
  Filled 2023-03-18 (×12): qty 1

## 2023-03-18 MED ORDER — OSMOLITE 1.5 CAL PO LIQD
1000.0000 mL | ORAL | Status: DC
  Administered 2023-03-18 – 2023-03-21 (×4): 1000 mL
  Filled 2023-03-18 (×4): qty 1000

## 2023-03-18 MED ORDER — CARVEDILOL 6.25 MG PO TABS
6.2500 mg | ORAL_TABLET | Freq: Two times a day (BID) | ORAL | Status: DC
  Administered 2023-03-18 – 2023-03-22 (×8): 6.25 mg
  Filled 2023-03-18: qty 2
  Filled 2023-03-18: qty 1
  Filled 2023-03-18: qty 2
  Filled 2023-03-18 (×4): qty 1
  Filled 2023-03-18: qty 2

## 2023-03-18 MED ORDER — POLYVINYL ALCOHOL 1.4 % OP SOLN
1.0000 [drp] | OPHTHALMIC | Status: DC | PRN
  Administered 2023-03-18 – 2023-03-19 (×2): 1 [drp] via OPHTHALMIC
  Filled 2023-03-18: qty 15

## 2023-03-18 MED ORDER — CARVEDILOL 3.125 MG PO TABS
6.2500 mg | ORAL_TABLET | Freq: Two times a day (BID) | ORAL | Status: DC
  Administered 2023-03-18: 6.25 mg via ORAL
  Filled 2023-03-18: qty 2

## 2023-03-18 MED ORDER — POLYETHYLENE GLYCOL 3350 17 G PO PACK: 17.0000 g | PACK | Freq: Every day | ORAL | Status: DC

## 2023-03-18 NOTE — Progress Notes (Signed)
   03/18/23 0541  Vitals  Temp 98.4 F (36.9 C)  Temp Source Oral  BP Location Left Arm  BP Method Automatic  Patient Position (if appropriate) Lying  Pulse Rate Source Monitor  Oxygen Therapy  O2 Device Nasal Cannula  During Treatment Monitoring  Intra-Hemodialysis Comments Tx completed (pt request to end treatment)  Post Treatment  Dialyzer Clearance Lightly streaked  Duration of HD Treatment -hour(s) 3.5 hour(s)  Hemodialysis Intake (mL) 0 mL  Liters Processed 50.9  Fluid Removed (mL) 1700 mL  Tolerated HD Treatment Yes  Post-Hemodialysis Comments Pt requested to end treatment with approximately remaining.  AVG/AVF Arterial Site Held (minutes) 12 minutes  AVG/AVF Venous Site Held (minutes) 10 minutes  Fistula / Graft Right Upper arm Arteriovenous fistula  Placement Date/Time: 03/11/23 1900   Placed prior to admission: Yes  Orientation: Right  Access Location: Upper arm  Access Type: (c) Arteriovenous fistula  Site Condition No complications  Fistula / Graft Assessment Thrill;Bruit  Status Deaccessed;Patent  Needle Size 15  Drainage Description None   Report to Jannifer Franklin

## 2023-03-18 NOTE — Progress Notes (Signed)
Spoke with ID - Vascular to see - Non-urgent TEE, I would wait a week or so to let upper airway swelling settle - Cefazolin can be given as OP w/ HD  IMTS to take over tomorrow starting 6/26 am  Myrla Halsted MD PCCM

## 2023-03-18 NOTE — Progress Notes (Addendum)
Nutrition Follow-up  DOCUMENTATION CODES:  Underweight, Severe malnutrition in context of chronic illness  INTERVENTION:  Continue tube feeding via cortrak: Osmolite 1.5 at 45 ml/h (1080 ml per day) Prosource TF20 60 ml 1x/d Provides 1700 kcal, 88 gm protein, 823 ml free water daily Continue renavite daily for increased micronutrient needs for HD. Monitor for PO diet advancement Add nepro TID if diet advanced  NUTRITION DIAGNOSIS:  Severe Malnutrition related to chronic illness as evidenced by severe fat depletion, severe muscle depletion. - new dx established  GOAL:  Patient will meet greater than or equal to 90% of their needs - being met with TF at goal  MONITOR:  Diet advancement, Labs, Weight trends, Skin, TF tolerance  REASON FOR ASSESSMENT:  Ventilator, Consult Enteral/tube feeding initiation and management  ASSESSMENT:  Pt admitted with airway compromise d/t lip swelling during dialysis. PMH significant for ESRD on HD, DM, GERD, remote laryngeal cancer s/p surgery and XRT (2011).  6/18 - admitted, nasal intubation d/t glottic edema 6/19 - cortrak placed (tip gastric), bridle not placed due to  TF initiated 6/24 - extubated  OP HD: TTS [place]   Access: RUA AVF Last OP HD post wt 38.4 (6/18) Last HD 6/25 Net UF 1.7L Post HD weight 6/25: 37kg  Pt resting in bedside chair at the time of assessment. Able to be extubated yesterday and avoiding trach placement. Worked with SLP this AM and is planning for MBS around lunch time to determine if PO diet can be initiated. Discussed home appetite and pt reports that she consistently eats 3 meals each day and that appetite has been stable.   Pt reports that she eats: Breakfast: grits and eggs Lunch: soup Dinner: soup  On exam, pt is extremely thin and depleted in muscle and fat stores. Pt reports no weight loss recently. States that she normally stays ~90 lbs. Does report that before she started HD she weighed ~130 lbs and  then lost weight. Has not be able to gain. Pt does report drinking Nepo supplements daily (reports 3x/d) and that she likes vanilla and wild berry. Will add once diet advanced  Did discuss with pt that her needs are elevated and weight is lower after HD today than PTA. Recommended leaving tube in place until pt could support her needs orally. Pt did not raise objections.   Nutritionally Relevant Medications: Scheduled Meds:  atorvastatin  80 mg Per Tube Daily   doxercalciferol  2 mcg Intravenous Q T,Th,Sa-HD   insulin aspart  0-15 Units Subcutaneous Q4H   multivitamin  1 tablet Per Tube QHS   pantoprazole IV  40 mg Intravenous QHS   polyethylene glycol  17 g Oral Daily   sevelamer carbonate  800 mg Per Tube TID   thiamine  100 mg Per Tube Daily   Continuous Infusions:   ceFAZolin (ANCEF) IV Stopped (03/17/23 2158)   feeding supplement (OSMOLITE 1.5 CAL) 35 mL/hr at 03/18/23 0650   Labs Reviewed: Na 134, chloride 93 BUN 54, creatinine 4.57 CBG ranges from 36-196 mg/dL over the last 24 hours  NUTRITION - FOCUSED PHYSICAL EXAM: Flowsheet Row Most Recent Value  Orbital Region No depletion  Upper Arm Region Severe depletion  Thoracic and Lumbar Region Severe depletion  Buccal Region No depletion  Temple Region No depletion  Clavicle Bone Region Severe depletion  Clavicle and Acromion Bone Region Severe depletion  Scapular Bone Region Severe depletion  Dorsal Hand Moderate depletion  Patellar Region Severe depletion  Anterior Thigh Region Severe  depletion  Posterior Calf Region Severe depletion  Edema (RD Assessment) None  Hair Reviewed  Eyes Reviewed  Mouth Reviewed  Skin Reviewed  Nails Reviewed       Diet Order:   Diet Order             Diet NPO time specified  Diet effective midnight                   EDUCATION NEEDS:  No education needs have been identified at this time  Skin:  Skin Assessment: Reviewed RN Assessment  Last BM:  6/24 - type 6  Height:   Ht Readings from Last 1 Encounters:  03/11/23 5\' 2"  (1.575 m)    Weight:  Wt Readings from Last 1 Encounters:  03/17/23 38.6 kg    Ideal Body Weight:  50 kg  BMI:  Body mass index is 15.56 kg/m.  Estimated Nutritional Needs:  Kcal:  1400-1700 kcal/d Protein:  75-90g/d Fluid:  1L + UOP    Greig Castilla, RD, LDN Clinical Dietitian RD pager # available in AMION  After hours/weekend pager # available in [facility]

## 2023-03-18 NOTE — Progress Notes (Signed)
eLink Physician-Brief Progress Note Patient Name: Susan Evans DOB: 06-27-1956 MRN: [id number]   Date of Service  03/18/2023  HPI/Events of Note  Dry eyes  eICU Interventions  Eye drops ordered     Intervention Category Minor Interventions: Routine modifications to care plan (e.g. PRN medications for pain, fever)  Oretha Milch 03/18/2023, 10:31 PM

## 2023-03-18 NOTE — Progress Notes (Signed)
Received patient in bed to unit. 5 Alert and oriented. x4 Informed consent signed and in chart. yes  TX duration:3hours  Patient tolerated well.yes, during treatment tachycardia present Pt without symptoms, no chest pain.due to rectal bleeding CBC sent to lab.  Transported back to the room Done at Bed-side Alert, without acute distress. No distress.  Hand-off given to patient's nurse. Donnel Saxon  RN.   Access used: Rt upper fistula Access issues: high arterial pressures. Pt refuse re-canulation, needles adjusted.  Total UF removed: 1700 Medication(s) given: 0 Post HD VS: 144/74. HR 114 Post HD weight: 37kg   [name]

## 2023-03-18 NOTE — Evaluation (Signed)
Physical Therapy Evaluation Patient Details Name: Susan Evans MRN: [id number] DOB: 1956-03-12 Today's Date: 03/18/2023  History of Present Illness  67 yo female presents to [facility] on 6/18 with lip swelling with subglottic edema/ compromised airway, s/p fiberoptic intubation 6/18-6/24. Other dx includes bacteremia. 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  Clinical Impression   Pt presents with generalized weakness, impaired dynamic standing balance, and decreased activity tolerance vs basleine. Pt to benefit from acute PT to address deficits. Pt ambulated good hallway distance with close guard for safety, functionally doing much better than anticipated given length of intubation. PT to progress mobility as tolerated, and will continue to follow acutely.         Recommendations for follow up therapy are one component of a multi-disciplinary discharge planning process, led by the attending physician.  Recommendations may be updated based on patient status, additional functional criteria and insurance authorization.  Follow Up Recommendations       Assistance Recommended at Discharge Intermittent Supervision/Assistance  Patient can return home with the following  A little help with walking and/or transfers;A little help with bathing/dressing/bathroom    Equipment Recommendations None recommended by PT  Recommendations for Other Services       Functional Status Assessment Patient has had a recent decline in their functional status and demonstrates the ability to make significant improvements in function in a reasonable and predictable amount of time.     Precautions / Restrictions Precautions Precautions: Fall Restrictions Weight Bearing Restrictions: No      Mobility  Bed Mobility Overal bed mobility: Needs Assistance Bed Mobility: Supine to Sit, Sit to Supine     Supine  to sit: Supervision, HOB elevated Sit to supine: Supervision   General bed mobility comments: assist with lines/leads    Transfers Overall transfer level: Needs assistance Equipment used: None Transfers: Sit to/from Stand Sit to Stand: Min guard           General transfer comment: close guard for safety, no physical assist    Ambulation/Gait Ambulation/Gait assistance: Min guard Gait Distance (Feet): 150 Feet Assistive device: None Gait Pattern/deviations: Step-through pattern, Decreased stride length Gait velocity: decr     General Gait Details: slowed, but steady gait, no AD  Stairs            Wheelchair Mobility    Modified Rankin (Stroke Patients Only)       Balance Overall balance assessment: Needs assistance Sitting-balance support: No upper extremity supported, Feet supported Sitting balance-Leahy Scale: Fair     Standing balance support: No upper extremity supported, During functional activity Standing balance-Leahy Scale: Fair                               Pertinent Vitals/Pain Pain Assessment Pain Assessment: Faces Faces Pain Scale: No hurt Pain Intervention(s): Monitored during session    Home Living Family/patient expects to be discharged to:: Private residence Living Arrangements: Spouse/significant other Available Help at Discharge: Family;Friend(s) Type of Home: Apartment Home Access: Level entry       Home Layout: One level Home Equipment: None      Prior Function Prior Level of Function : Needs assist             Mobility Comments: pt reports indep with mobility ADLs Comments: per pt, pt has an aide to assist with cookng  and cleaning on MWF, states she helps with dressing bathing "if she needs it"     Hand Dominance   Dominant Hand: Right    Extremity/Trunk Assessment   Upper Extremity Assessment Upper Extremity Assessment: Defer to OT evaluation    Lower Extremity Assessment Lower Extremity  Assessment: Generalized weakness    Cervical / Trunk Assessment Cervical / Trunk Assessment: Kyphotic;Other exceptions Cervical / Trunk Exceptions: forward head, rounded shoulders  Communication   Communication: No difficulties  Cognition Arousal/Alertness: Awake/alert Behavior During Therapy: WFL for tasks assessed/performed Overall Cognitive Status: Within Functional Limits for tasks assessed                                          General Comments General comments (skin integrity, edema, etc.): vss    Exercises     Assessment/Plan    PT Assessment Patient needs continued PT services  PT Problem List Decreased strength;Decreased mobility;Decreased activity tolerance;Decreased balance;Decreased knowledge of use of DME;Decreased knowledge of precautions;Decreased safety awareness       PT Treatment Interventions DME instruction;Therapeutic activities;Gait training;Therapeutic exercise;Patient/family education;Balance training;Stair training;Functional mobility training;Neuromuscular re-education    PT Goals (Current goals can be found in the Care Plan section)  Acute Rehab PT Goals Patient Stated Goal: home PT Goal Formulation: With patient Time For Goal Achievement: 04/01/23 Potential to Achieve Goals: Good    Frequency Min 2X/week     Co-evaluation               AM-PAC PT "6 Clicks" Mobility  Outcome Measure Help needed turning from your back to your side while in a flat bed without using bedrails?: A Little Help needed moving from lying on your back to sitting on the side of a flat bed without using bedrails?: A Little Help needed moving to and from a bed to a chair (including a wheelchair)?: A Little Help needed standing up from a chair using your arms (e.g., wheelchair or bedside chair)?: A Little Help needed to walk in hospital room?: A Little Help needed climbing 3-5 steps with a railing? : A Lot 6 Click Score: 17    End of Session    Activity Tolerance: Patient tolerated treatment well Patient left: with call bell/phone within reach;in bed;with bed alarm set;with nursing/sitter in room Nurse Communication: Mobility status PT Visit Diagnosis: Other abnormalities of gait and mobility (R26.89);Muscle weakness (generalized) (M62.81)    Time: 6578-4696 PT Time Calculation (min) (ACUTE ONLY): 16 min   Charges:   PT Evaluation $PT Eval Low Complexity: 1 Low          Breonna Gafford S, PT DPT Acute Rehabilitation Services Secure Chat Preferred  Office [phone]   Suzzane Quilter Sheliah Plane 03/18/2023, 3:07 PM

## 2023-03-18 NOTE — Procedures (Signed)
Modified Barium Swallow Study  Patient Details  Name: Susan Evans MRN: [id number] Date of Birth: June 06, 1956  Today's Date: 03/18/2023  Modified Barium Swallow completed.  Full report located under Chart Review in the Imaging Section.  History of Present Illness Susan Evans is a 67yo female who presented 6/18 with lip swelling noted at dialysis where they attempted extra volume removal but she did not tolerate this r/t tachycardia and malaise so she was sent to ER where her lip swelling progressed. She also developed SOB and hoarse voice and ENT was consulted. She was ultimately taken to OR and nasally intubated by ENT. ETT 6/18-24. 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   Clinical Impression Patient presents with a severe to profound pharyngeal phase dysphagia as per this MBS. Pharyngeal structures appear edematous with significantly reduced epiglottic movement. In addition, NG feeding tube does appear to be impacting her already impaired swallow function. SLP administered spoon sips of thin liquids and nectar thick liquids (not more than four sips total). During swallow, portion of liquid boluses enter into pyriform sinus and portion enters laryngeal vestibule before any swallow is initiated. When swallow does appear to initiate, anterior hyoid excursion and laryngeal elevation and epiglottic movement are significantly reduced. No epiglottic inversion was observed. PES was not observed to open, however trace amount of barium was observed to transit through esophagus. When patient cued to swallow again, barium in pharynx and laryngeal vestibule started to transit back up into oropharynx. She was then able to expectorate this barium residue. Patient is not able to safely or effectively consume any PO's at this time. SLP is recommending repeat MBS after approximately 5 days to allow time for edema of pharyngeal structures to reduce. In  addition, it would be beneficial for NG to be removed prior to repeat MBS. Factors that may increase risk of adverse event in presence of aspiration Susan Evans & Clearance Coots 2021): Frail or deconditioned;Presence of tubes (ETT, trach, NG, etc.);Poor general health and/or compromised immunity  Swallow Evaluation Recommendations Recommendations: NPO Medication Administration: Via alternative means Oral care recommendations: Oral care QID (4x/day);Staff/trained caregiver to provide oral care      Angela Nevin, MA, CCC-SLP Speech Therapy

## 2023-03-18 NOTE — Progress Notes (Incomplete)
HD#8 Subjective:  Patient summary: 67 year old female living with ESRD on dialysis, diabetes, remote history of laryngeal cancer status postsurgery and chemo 2011, hypertension, hyperlipidemia, IDA, thyroid disease, who was admitted for angioedema and epiglottitis that required intubation.  She was found to have group B strep bacteremia that is treated with IV cefazolin.  The source of the infection is potentially the left AV fistula with possible endocarditis.  Infectious disease is following.  Since is difficult to do TEE, plan was to treat her for IE empirically with 6 weeks of IV antibiotic.  Patient was successfully extubated on 6/24.  Similar event happened on 12/2021 that required intubation due to glottic edema.  Patient underwent anesthesia for left AV graft excision from possible infection.  No blood culture was obtained at that time steroids was given and ENT was consulted.    Overnight Events: None  Patient was seen at bedside.  She appears comfortable and in no acute respiratory distress.  Her speech is clear.  She denies any shortness of breath or discomfort.  Patient denies frequent throat closing sensation.  She states after her throat surgery, she will feel short of breath if she eats too fast.  She denies family history of angioedema.   She reports a few episodes of bright red blood per rectum during this hospitalization.  She denies prior episodes.  She reports straining with bowel movements.  Objective:  Vital signs in last 24 hours: Vitals:   03/19/23 0600 03/19/23 0718 03/19/23 0821 03/19/23 0830  BP: (!) 166/58  (!) 162/68 (!) 182/73  Pulse: 93  95 95  Resp: 19   18  Temp:  (!) 96.8 F (36 C)    TempSrc:  Axillary    SpO2: 100%   95%  Weight:      Height:       Supplemental O2: Venturi Mask SpO2: 95 % O2 Flow Rate (L/min): 15 L/min FiO2 (%): 40 %   Physical Exam:  Physical Exam Constitutional:      General: She is not in acute distress.    Comments:  Cachectic and frail  HENT:     Head: Normocephalic and atraumatic.     Nose: Nose normal.     Mouth/Throat:     Mouth: Mucous membranes are moist.     Comments: Moist mucosal membrane.  No obvious oral obstruction on visualization.  Phonation is clear. Eyes:     General:        Right eye: No discharge.        Left eye: No discharge.     Conjunctiva/sclera: Conjunctivae normal.  Cardiovascular:     Rate and Rhythm: Regular rhythm. Tachycardia present.  Pulmonary:     Effort: Pulmonary effort is normal. No respiratory distress.     Breath sounds: No wheezing.     Comments: No wheezing or crackles.  Upper airway noise heard Abdominal:     General: Bowel sounds are normal. There is no distension.     Palpations: Abdomen is soft.     Tenderness: There is no abdominal tenderness.  Musculoskeletal:        General: Normal range of motion.     Cervical back: Normal range of motion.     Right lower leg: No edema.     Left lower leg: No edema.     Comments: Right fistula appears non infected. Palpable thrill  Skin:    General: Skin is warm.  Neurological:     Mental Status:  She is alert. Mental status is at baseline.  Psychiatric:        Mood and Affect: Mood normal.     Filed Weights   03/16/23 0500 03/17/23 0500 03/19/23 0500  Weight: 38.4 kg 38.6 kg 38.5 kg     Intake/Output Summary (Last 24 hours) at 03/19/2023 0838 Last data filed at 03/19/2023 0500 Gross per 24 hour  Intake 1083.86 ml  Output --  Net 1083.86 ml   Net IO Since Admission: 2,988.91 mL [03/19/23 0838]  Pertinent Labs:    Latest Ref Rng & Units 03/19/2023    1:02 AM 03/18/2023    4:31 AM 03/16/2023    1:22 AM  CBC  WBC 4.0 - 10.5 K/uL 9.8  13.3  9.8   Hemoglobin 12.0 - 15.0 g/dL 9.0  9.7  98.1   Hematocrit 36.0 - 46.0 % 28.7  30.3  31.0   Platelets 150 - 400 K/uL 188  183  174        Latest Ref Rng & Units 03/19/2023    1:02 AM 03/18/2023    1:33 AM 03/16/2023    1:22 AM  CMP  Glucose 70 - 99  mg/dL 191  478  295   BUN 8 - 23 mg/dL 33  54  24   Creatinine 0.44 - 1.00 mg/dL 6.21  3.08  6.57   Sodium 135 - 145 mmol/L 136  134  132   Potassium 3.5 - 5.1 mmol/L 4.7  4.7  4.0   Chloride 98 - 111 mmol/L 96  93  93   CO2 22 - 32 mmol/L 30  27  28    Calcium 8.9 - 10.3 mg/dL 8.4  8.9  8.1     Imaging: DG CHEST PORT 1 VIEW  Result Date: 03/18/2023 CLINICAL DATA:  Hypoxia. EXAM: PORTABLE CHEST 1 VIEW COMPARISON:  Radiograph 03/14/2023. FINDINGS: Interval extubation. Weighted enteric tube remains in place with tip below the diaphragm. Heart is normal in size. Mediastinal contours are normal. Increased lung volumes from prior exam. No focal airspace disease, pneumothorax, large pleural effusion or pulmonary edema. No acute osseous findings. IMPRESSION: Interval extubation. Increased lung volumes from prior exam. No localizing abnormality. Electronically Signed   By: Narda Rutherford M.D.   On: 03/18/2023 20:54   DG Swallowing Func-Speech Pathology  Result Date: 03/18/2023 Table formatting from the original result was not included. Modified Barium Swallow Study Patient Details Name: CHRISTOPHER HINK MRN: [id number] Date of Birth: 12/31/1955 Today's Date: 03/18/2023 HPI/PMH: HPI: Cerria Randhawa is a 67yo female who presented 6/18 with lip swelling noted at dialysis where they attempted extra volume removal but she did not tolerate this r/t tachycardia and malaise so she was sent to ER where her lip swelling progressed. She also developed SOB and hoarse voice and ENT was consulted. She was ultimately taken to OR and nasally intubated by ENT. ETT 6/18-24. 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 Clinical Impression: Clinical Impression: Patient presents with a severe to profound pharyngeal phase dysphagia as per this MBS. Pharyngeal structures appear edematous with significantly reduced epiglottic movement. In addition, NG feeding tube does appear to be  impacting her already impaired swallow function. SLP administered spoon sips of thin liquids and nectar thick liquids (not more than four sips total). During swallow, portion of liquid boluses enter into pyriform sinus and portion enters laryngeal vestibule before any swallow is initiated. When swallow does appear to initiate, anterior  hyoid excursion and laryngeal elevation and epiglottic movement are significantly reduced. No epiglottic inversion was observed. PES was not observed to open, however trace amount of barium was observed to transit through esophagus. When patient cued to swallow again, barium in pharynx and laryngeal vestibule started to transit back up into oropharynx. She was then able to expectorate this barium residue. Patient is not able to safely or effectively consume any PO's at this time. SLP is recommending repeat MBS after approximately 5 days to allow time for edema of pharyngeal structures to reduce. In addition, it would be beneficial for NG to be removed prior to repeat MBS. Factors that may increase risk of adverse event in presence of aspiration Rubye Oaks & Clearance Coots 2021): Factors that may increase risk of adverse event in presence of aspiration Rubye Oaks & Clearance Coots 2021): Frail or deconditioned; Presence of tubes (ETT, trach, NG, etc.); Poor general health and/or compromised immunity Recommendations/Plan: Swallowing Evaluation Recommendations Swallowing Evaluation Recommendations Recommendations: NPO Medication Administration: Via alternative means Oral care recommendations: Oral care QID (4x/day); Staff/trained caregiver to provide oral care Treatment Plan Treatment Plan Treatment recommendations: Therapy as outlined in treatment plan below Follow-up recommendations: Follow physicians's recommendations for discharge plan and follow up therapies Functional status assessment: Patient has had a recent decline in their functional status and demonstrates the ability to make significant  improvements in function in a reasonable and predictable amount of time. Treatment frequency: Min 2x/week Treatment duration: 2 weeks Interventions: Trials of upgraded texture/liquids; Aspiration precaution training; Patient/family education Recommendations Recommendations for follow up therapy are one component of a multi-disciplinary discharge planning process, led by the attending physician.  Recommendations may be updated based on patient status, additional functional criteria and insurance authorization. Assessment: Orofacial Exam: Orofacial Exam Oral Cavity: Oral Hygiene: WFL Oral Cavity - Dentition: Edentulous Orofacial Anatomy: WFL Anatomy: Anatomy: WFL Boluses Administered: Boluses Administered Boluses Administered: Thin liquids (Level 0); Mildly thick liquids (Level 2, nectar thick)  Oral Impairment Domain: Oral Impairment Domain Lip Closure: No labial escape Tongue control during bolus hold: Not tested Bolus transport/lingual motion: Brisk tongue motion Oral residue: Complete oral clearance Location of oral residue : N/A Initiation of pharyngeal swallow : Pyriform sinuses  Pharyngeal Impairment Domain: Pharyngeal Impairment Domain Soft palate elevation: No bolus between soft palate (SP)/pharyngeal wall (PW) Laryngeal elevation: Minimal superior movement of thyroid cartilage with minimal approximation of arytenoids to epiglottic petiole Anterior hyoid excursion: Partial anterior movement Epiglottic movement: No inversion Laryngeal vestibule closure: Incomplete, narrow column air/contrast in laryngeal vestibule Pharyngeal stripping wave : Present - diminished Pharyngeal contraction (A/P view only): N/A Pharyngoesophageal segment opening: Minimal distention/minimal duration, marked obstruction of flow Tongue base retraction: Trace column of contrast or air between tongue base and PPW Pharyngeal residue: Majority of contrast within or on pharyngeal structures Location of pharyngeal residue: Valleculae;  Aryepiglottic folds; Pyriform sinuses; Diffuse (>3 areas); Pharyngeal wall; Tongue base  Esophageal Impairment Domain: No data recorded Pill: No data recorded Penetration/Aspiration Scale Score: Penetration/Aspiration Scale Score 1.  Material does not enter airway: Mildly thick liquids (Level 2, nectar thick) 2.  Material enters airway, remains ABOVE vocal cords then ejected out: Mildly thick liquids (Level 2, nectar thick) 7.  Material enters airway, passes BELOW cords and not ejected out despite cough attempt by patient: Thin liquids (Level 0) 8.  Material enters airway, passes BELOW cords without attempt by patient to eject out (silent aspiration) : Thin liquids (Level 0) Compensatory Strategies: Compensatory Strategies Compensatory strategies: No   General Information: Caregiver present: No  Diet Prior to this Study: NPO; Cortrak/Small bore NG tube   No data recorded  Respiratory Status: WFL   Supplemental O2: Nasal cannula   History of Recent Intubation: Yes  Behavior/Cognition: Alert; Cooperative; Pleasant mood No data recorded Baseline vocal quality/speech: Dysphonic Volitional Cough: Able to elicit Volitional Swallow: Able to elicit Exam Limitations: No limitations Goal Planning: Prognosis for improved oropharyngeal function: Fair Barriers to Reach Goals: Severity of deficits; Time post onset No data recorded Patient/Family Stated Goal: resume POs Consulted and agree with results and recommendations: Nurse Pain: Pain Assessment Pain Assessment: No/denies pain Pain Score: 0 Faces Pain Scale: 0 Facial Expression: 0 Body Movements: 0 Muscle Tension: 0 Compliance with ventilator (intubated pts.): N/A Vocalization (extubated pts.): 0 CPOT Total: 0 End of Session: Start Time:SLP Start Time (ACUTE ONLY): 1031 Stop Time: SLP Stop Time (ACUTE ONLY): 1039 Time Calculation:SLP Time Calculation (min) (ACUTE ONLY): 8 min Charges: SLP Evaluations $ SLP Speech Visit: 1 Visit SLP Evaluations $BSS Swallow: 1 Procedure SLP  visit diagnosis: SLP Visit Diagnosis: Dysphagia, pharyngoesophageal phase (R13.14) Past Medical History: Past Medical History: Diagnosis Date  AKI (acute kidney injury) (HCC) 08/12/2019  Anaphylactic shock, unspecified, initial encounter 05/09/2020  Chronic kidney disease   Dialysis T/Th/Sat- [address]  CKD stage G1/A3, GFR > 90 and albumin creatinine ratio >300 mg/g 07/08/2016  Nephrotic range proteinuria  Diabetes mellitus   type 2   Dyspnea   "when I have too much fluid."  Ganglion cyst   right wrist  GERD (gastroesophageal reflux disease)   History of radiation therapy 02/27/10 -04/13/10  left base of tongue, bilat neck  Hyperlipidemia   Hypertension   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/21/2021  Iron deficiency anemia   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  Menopause   Thyroid disease   Hyperthyroidism  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/08/2020  Tobacco abuse   PT HAS QUIT Past Surgical History: Past Surgical History: Procedure Laterality Date  AV FISTULA PLACEMENT Left 06/02/2020  Procedure: INSERTION OF ARTERIOVENOUS (AV) GORE-TEX GRAFT ARM LEFT;  Surgeon: Cephus Shelling, MD;  Location: [facility] OR;  Service: Vascular;  Laterality: Left;  AV FISTULA PLACEMENT Right 12/27/2020  Procedure: RIGHT ARM CEPHALIC ARTERIOVENOUS FISTULA CREATION;  Surgeon: Maeola Harman, MD;  Location: [facility];  Service: Vascular;  Laterality: Right;  AVGG REMOVAL Left 12/21/2021  Procedure: EXCISION OF LEFT ARM ARTERIOVENOUS GORETEX GRAFT (AVGG);  Surgeon: Maeola Harman, MD;  Location: [facility] OR;  Service: Vascular;  Laterality: Left;  IR FLUORO GUIDE CV LINE LEFT  05/15/2020  IR REMOVAL TUN CV CATH W/O FL  05/15/2020  IR REMOVAL TUN CV CATH W/O FL  06/15/2021  IR US GUIDE VASC ACCESS LEFT  05/15/2020  NECK SURGERY    REDUCTION MAMMAPLASTY    TRACHEOSTOMY TUBE PLACEMENT N/A 03/11/2023  Procedure: AWAKE INTUBATION;  Surgeon: Christia Reading, MD;  Location: [facility] OR;   Service: ENT;  Laterality: N/A; Angela Nevin, MA, CCC-SLP Speech Therapy   Assessment/Plan:   Principal Problem:   Bacteremia due to group B Streptococcus Active Problems:   Type 2 diabetes mellitus with complication (HCC)   Essential hypertension   ESRD (end stage renal disease) (HCC)   Acute hypoxic respiratory failure (HCC)   Microcytic anemia   Malignant neoplasm of pharynx, unspecified (HCC)   Protein-calorie malnutrition, severe   Patient Summary: Susan Evans is a 67 year old female living with ESRD on dialysis, diabetes, remote history of laryngeal cancer status postsurgery and  chemo 2011, hypertension, hyperlipidemia, IDA, thyroid disease, who was admitted for angioedema that required intubation.  Group B strep bacteremia Unclear source of infection. No dental abscess or infection seen on CT maxillofacial.  No obvious evidence of AV fistula infection seen on imaging.  TTE did not show any evidence of valvular vegetation.  TEE was not performed due to glottic edema.  Patient was treated empirically for infectious carditis with 6 weeks of IV cefazolin.  Clinically she is doing better with resolution of tachycardia and fever. 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  She has a similar presentation in 11/2021 that also happened in the setting of infected left AV graft (no blood culture drawn at that time).  Allergy is a possibility but looks like there was no evidence of urticaria.  Her C1 esterase inhibitor is very low that brings a suspicion of hereditary angioedema. She is at high risk for airway obstruction due to her prior surgery of her pharyngeal cancer. -Treating infection as above -Hypoallergenic dialyzer with HD -Check C4 -Patient will need to follow-up with outpatient  allergist for diagnostic testing for HAE. -If edema remains persistent in the next 1-2 days, may need to reconsult ENT for repeat scope  Acute hypoxic respiratory failure After her extubation on 624, patient has been requiring more oxygen.  She is on Venturi mask 15 L today.  Chest x-ray yesterday did not show any focal consolidation or pleural effusion.  She does not appear volume overloaded on exam.  Mentation is normal.  Questionable mucus  impaction vs aspiration vs atelectasis.  Patient denies sensation of throat closing.  Low suspicion for pneumonia because she is already on antibiotics. -Repeat chest x-ray today -Incentive spirometry, hypertonic saline nebulized, chest PT, frequent suction  Addendum:  Her O2 improves after chest PT and suctioning. Currently satting > 93 on 4L [place].   Malnutrition Currently receiving feeding through NG tube.  Modified barium swallow showed that she is not able to consume any p.o. at this time due to persistent swelling.  ESRD on HD TTS -Appreciate nephrology assistance  Hematochezia Microcytic anemia This happened intermittently during this admission.  She has normal stool this morning.  Suspect hemorrhoids due to straining.  Last colonoscopy in 2007 was normal. Since her hemoglobin is stable, will not involve GI at this time.  She will be a difficult candidate for colonoscopy due to angioedema. -Continue bowel regimen -Follow CBC -Would not obtain iron study in setting of infection -Follow-up with GI outpatient if hemoglobin remains stable during this admission.   Hypertension Home regimen: Clonidine patch, hydralazine and verapamil -Resume Hydralazine  Type 2 diabetes A1C 9 in 10/2022 CBGs today within reasonable range -Continue sliding scale insulin  Diet: NPO IVF: None,None VTE: Heparin Code: Full PT/OT recs: Pending TOC recs:   Dispo: Barrier for discharge include PT/OT evaluation and improvement of edema and able to tolerate  p.o  Doran Stabler, DO 03/19/2023, 8:38 AM Pager: [phone]  Please contact the on call pager after 5 pm and on weekends at [phone].

## 2023-03-18 NOTE — Consult Note (Addendum)
Hospital Consult    Reason for Consult:  question of infected dialysis access Requesting Physician:  CCM MRN #:  [id number]  History of Present Illness: This is a 67 y.o. female admitted due to compromised airway requiring fiberoptic intubation by ENT.  She also has bacteremia of unknown etiology.  Vascular surgery consulted for evaluation of suspected infection of right arm AV fistula.  Right arm brachiocephalic fistula created in April 2022.  This access is being used for hemodialysis.  CT of the right arm does not show any fluid collection involving the cephalic vein.  Surgical history significant for partial excision of left arm AV graft in March of last year.  She denies any drainage, swelling, or pain in her left arm.  Past Medical History:  Diagnosis Date   AKI (acute kidney injury) (HCC) 08/12/2019   Anaphylactic shock, unspecified, initial encounter 05/09/2020   Chronic kidney disease    Dialysis T/Th/Sat- [address]   CKD stage G1/A3, GFR > 90 and albumin creatinine ratio >300 mg/g 07/08/2016   Nephrotic range proteinuria   Diabetes mellitus    type 2    Dyspnea    "when I have too much fluid."   Ganglion cyst    right wrist   GERD (gastroesophageal reflux disease)    History of radiation therapy 02/27/10 -04/13/10   left base of tongue, bilat neck   Hyperlipidemia    Hypertension    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/21/2021   Iron deficiency anemia    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 OR;  Service: Vascular;  Laterality: Right;   AVGG REMOVAL Left 12/21/2021   Procedure: EXCISION OF LEFT ARM ARTERIOVENOUS GORETEX GRAFT (AVGG);  Surgeon: Maeola Harman, MD;  Location: [facility] OR;  Service: Vascular;  Laterality: Left;   IR FLUORO GUIDE CV LINE LEFT  05/15/2020   IR REMOVAL TUN CV CATH W/O FL  05/15/2020   IR REMOVAL TUN CV CATH W/O FL  06/15/2021   IR US GUIDE VASC ACCESS LEFT  05/15/2020   NECK SURGERY     REDUCTION MAMMAPLASTY     TRACHEOSTOMY TUBE PLACEMENT N/A 03/11/2023   Procedure: AWAKE INTUBATION;  Surgeon: Christia Reading, MD;  Location: [facility] OR;  Service: ENT;  Laterality: N/A;    Allergies  Allergen Reactions   Hydrochlorothiazide Itching and Other (See Comments)    Hyponatremia    Penicillins Other (See Comments)    "Takes out all of her hair"    Prior to Admission medications   Medication Sig Start Date End Date Taking? Authorizing Provider  Accu-Chek FastClix Lancets MISC 5 (FIVE) TIMES DAILY DIAGNOSIS CODE E11.9 09/10/19   Tyson Alias, MD  acetaminophen (TYLENOL) 325 MG tablet Take 2 tablets (650 mg total) by mouth every 4 (four) hours as needed for mild pain (temp > 101.5). 12/24/21   Lynnell Catalan, MD  atorvastatin (LIPITOR) 80  MG tablet TAKE 1 TABLET BY MOUTH EVERY DAY 12/31/22   Willette Cluster, MD  B-D UF III MINI PEN NEEDLES 31G X 5 MM MISC USE TO INJECT NOVOLOG 3 TIMES A DAY. DIAGNOSIS CODE E11.9, Z79.4 04/03/21   Atway, Rayann N, DO  BD VEO INSULIN SYRINGE U/F 31G X 15/64" 0.3 ML MISC USE TO INJECT LANTUS INSULIN TWO TIMES A DAY 07/11/22   Willette Cluster, MD  Blood Glucose Monitoring Suppl (ACCU-CHEK AVIVA PLUS) w/Device KIT Use to test blood glucose  3 times daily 12/15/16   Arnetha Courser, MD  Cholecalciferol (VITAMIN D3) 50 MCG (2000 UT) capsule TAKE 1 CAPSULE BY MOUTH EVERY DAY 04/26/22   Atway, Rayann N, DO  cloNIDine (CATAPRES  - DOSED IN MG/24 HR) 0.2 mg/24hr patch Place 1 patch (0.2 mg total) onto the skin once a week. 03/13/22   Carmel Sacramento, MD  glucose blood (ACCU-CHEK GUIDE) test strip TEST 5 (FIVE) TIMES DAILY DIAGNOSIS CODE E11.9 11/23/20   Alphonzo Severance, MD  hydrALAZINE (APRESOLINE) 50 MG tablet TAKE 1 TABLET BY MOUTH TWICE A DAY 12/31/22   Willette Cluster, MD  lidocaine-prilocaine (EMLA) cream Apply 1 application. topically as needed (dialysis).    [provider]  omeprazole (PRILOSEC) 20 MG capsule TAKE 1 CAPSULE BY MOUTH EVERY DAY 03/03/23   Willette Cluster, MD  sevelamer carbonate (RENVELA) 800 MG tablet Take 800 mg by mouth 3 (three) times daily with meals.    [provider]  verapamil (CALAN-SR) 180 MG CR tablet TAKE 2 TABLETS (360 MG TOTAL) BY MOUTH AT BEDTIME. 12/03/22   Willette Cluster, MD    Social History   Socioeconomic History   Marital status: Single    Spouse name: Not on file   Number of children: Not on file   Years of education: 11   Highest education level: Not on file  Occupational History    Employer: UNEMPLOYED    Employer: UNEMPLOYED  Tobacco Use   Smoking status: Former    Packs/day: .3    Types: Cigarettes    Quit date: 04/03/2021    Years since quitting: 1.9    Passive exposure: Never   Smokeless tobacco: Never   Tobacco comments:    1 cig per day   Vaping Use   Vaping Use: Never used  Substance and Sexual Activity   Alcohol use: No    Alcohol/week: 0.0 standard drinks of alcohol   Drug use: No   Sexual activity: Not on file  Other Topics Concern   Not on file  Social History Narrative   Not on file   Social Determinants of Health   Financial Resource Strain: Low Risk  (03/13/2022)   Overall Financial Resource Strain (CARDIA)    Difficulty of Paying Living Expenses: Not hard at all  Food Insecurity: No Food Insecurity (03/13/2022)   Hunger Vital Sign    Worried About Running Out of Food in the Last Year: Never true    Ran Out of Food in the Last Year:  Never true  Transportation Needs: No Transportation Needs (03/13/2022)   PRAPARE - Administrator, Civil Service (Medical): No    Lack of Transportation (Non-Medical): No  Physical Activity: Insufficiently Active (03/13/2022)   Exercise Vital Sign    Days of Exercise per Week: 4 days    Minutes of Exercise per Session: 30 min  Stress: No Stress Concern Present (03/13/2022)   [employer] of Occupational Health - Occupational Stress Questionnaire    Feeling  of Stress : Not at all  Social Connections: Socially Integrated (03/13/2022)   Social Connection and Isolation Panel [NHANES]    Frequency of Communication with Friends and Family: More than three times a week    Frequency of Social Gatherings with Friends and Family: More than three times a week    Attends Religious Services: More than 4 times per year    Active Member of [employer] or Organizations: Yes    Attends Banker Meetings: 1 to 4 times per year    Marital Status: Living with partner  Intimate Partner Violence: Not At Risk (03/13/2022)   Humiliation, Afraid, Rape, and Kick questionnaire    Fear of Current or Ex-Partner: No    Emotionally Abused: No    Physically Abused: No    Sexually Abused: No     Family History  Problem Relation Age of Onset   Stroke Father    Cancer Father        unknown ca   Hypertension Mother    Diabetes Mother    Chronic Renal Failure Sister    Chronic Renal Failure Brother    Cancer Paternal Aunt        colon ca   Breast cancer Neg Hx     ROS: Otherwise negative unless mentioned in HPI  Physical Examination  Vitals:   03/18/23 0830 03/18/23 0900  BP: (!) 80/63 112/70  Pulse: (!) 104 100  Resp: 16 16  Temp:    SpO2: 99% 99%   Body mass index is 15.56 kg/m.  General:  WDWN in NAD Gait: Not observed HENT: WNL, normocephalic Pulmonary: normal non-labored breathing, without Rales, rhonchi,  wheezing Cardiac: regular Abdomen:  soft, NT/ND, no  masses Skin: without rashes Vascular Exam/Pulses: palpable radial pulses Extremities: Patent right arm brachiocephalic fistula with palpable thrill near the anastomosis; no areas of fluctuance, erythema, tenderness to touch; pulsatile as you approach the axilla; left arm with residual AV graft without fluctuance, tenderness, or erythema Musculoskeletal: no muscle wasting or atrophy  Neurologic: A&O X 3;  No focal weakness or paresthesias are detected; speech is fluent/normal Psychiatric:  The pt has Normal affect. Lymph:  Unremarkable  CBC    Component Value Date/Time   WBC 13.3 (H) 03/18/2023 0431   RBC 4.08 03/18/2023 0431   HGB 9.7 (L) 03/18/2023 0431   HGB 11.9 04/07/2014 0944   HCT 30.3 (L) 03/18/2023 0431   HCT 37.4 04/07/2014 0944   PLT 183 03/18/2023 0431   PLT 215 04/07/2014 0944   MCV 74.3 (L) 03/18/2023 0431   MCV 73.9 (L) 04/07/2014 0944   MCH 23.8 (L) 03/18/2023 0431   MCHC 32.0 03/18/2023 0431   RDW 14.3 03/18/2023 0431   RDW 14.5 04/07/2014 0944   LYMPHSABS 0.3 (L) 03/11/2023 0909   LYMPHSABS 1.8 04/07/2014 0944   MONOABS 1.3 (H) 03/11/2023 0909   MONOABS 0.5 04/07/2014 0944   EOSABS 0.0 03/11/2023 0909   EOSABS 0.1 04/07/2014 0944   BASOSABS 0.1 03/11/2023 0909   BASOSABS 0.0 04/07/2014 0944    BMET    Component Value Date/Time   NA 134 (L) 03/18/2023 0133   NA 135 02/29/2020 1510   NA 131 (L) 04/07/2014 0943   K 4.7 03/18/2023 0133   K 4.0 04/07/2014 0943   CL 93 (L) 03/18/2023 0133   CL 100 10/07/2012 1033   CO2 27 03/18/2023 0133   CO2 27 04/07/2014 0943   GLUCOSE 175 (H) 03/18/2023 0133   GLUCOSE 258 (H)  04/07/2014 0943   GLUCOSE 137 (H) 10/07/2012 1033   BUN 54 (H) 03/18/2023 0133   BUN 40 (H) 02/29/2020 1510   BUN 12.5 04/07/2014 0943   CREATININE 4.57 (H) 03/18/2023 0133   CREATININE 1.1 04/07/2014 0943   CALCIUM 8.9 03/18/2023 0133   CALCIUM 10.3 04/07/2014 0943   GFRNONAA 10 (L) 03/18/2023 0133   GFRNONAA 85 01/06/2014 1358   GFRAA  15 (L) 02/29/2020 1510   GFRAA >89 01/06/2014 1358    COAGS: Lab Results  Component Value Date   INR 1.0 03/11/2023   INR 1.0 08/12/2019     Non-Invasive Vascular Imaging:   CT right arm negative for fluid collection or peri -istula fluid    ASSESSMENT/PLAN: This is a 67 y.o. female who is bacteremic with unknown etiology  -Right brachiocephalic fistula creation in April 2022.  She continues to dialyze via this access.  Workup included CT of the right arm which was negative for any fluid collection involving the right arm fistula.  On exam there is no tenderness to palpation, fluid collection, or surrounding erythema to suggest any infection involving the right arm fistula.  Exam is unremarkable.  Left arm also without any sign of infection on exam.  On-call vascular surgeon Dr. Randie Heinz will evaluate the patient later today and provide further treatment plans however fistula does not appear grossly infected.   Emilie Rutter PA-C [facility] [phone]    I have interviewed and examined patient with PA and agree with assessment and plan above.  Graft in the left arm and fistula in the right arm did not appear overtly infected.  She may ultimately benefit from right arm fistulogram due to minimal swelling and pulsatility in the fistula I do not see a role for intervention at this time.  Taela Charbonneau C. Randie Heinz, MD [facility] [place] Office: [phone] Pager: [phone]

## 2023-03-18 NOTE — Progress Notes (Addendum)
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 She also developed SOB and hoarse voice and ENT was consulted. She was ultimately taken to OR and nasally intubated by ENT.  Post op she was brought to ICU and PCCM consulted for ICU mgmt.  Pt denies recent medication changes or new medications  She had similar event ~1 year ago requiring intubation. Janina Mayo was considered at the time but ultimately not needed and no clear cause identified.   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   Significant Hospital Events: Including procedures, antibiotic start and stop dates in addition to other pertinent events   6/18 admitted, OR for nasal intubation due to glottic edema 6/24 extubated  Interim History / Subjective:  Failed yale swallow Feels constipated, did pass some blood clots from below yesterday HD last  night  Objective   Blood pressure (!) 130/120, pulse (!) 117, temperature (P) 98.4 F (36.9 C), temperature source (P) Oral, resp. rate (!) 21, height 5\' 2"  (1.575 m), weight 38.6 kg, SpO2 100 %.    Vent Mode: PSV;CPAP FiO2 (%):  [40 %] 40 % PEEP:  [5 cmH20] 5 cmH20 Pressure Support:  [10 cmH20] 10 cmH20   Intake/Output Summary (Last 24 hours) at 03/18/2023 0715 Last data filed at 03/18/2023 0650 Gross per 24 hour  Intake 1070.02 ml  Output 2200 ml  Net -1129.98 ml    Filed Weights   03/15/23 0500 03/16/23 0500 03/17/23 0500  Weight: 40.8 kg 38.4 kg 38.6 kg    Examination: No distress Abd mildly ttp Heart tachy Mouth/tongue swelling continues to improve Moves to command Aox3  H/H stable CBG ok off D10  Ancillary tests personally reviewed:   Echo: LVEF 60-65%, no RWMA, normal RV size and function. Normal RV size and function. Degenerative MV, no MR or MS. Normal IVC.  Mild AI.  CT maxillofacial: no dental abscesses, possible left cellulitis of face and neck Blood cultures> strep agalactiae 4/4 Low C1- Esterase   Urinalysis negative CT right arm shows no discrete abscess  Assessment & Plan:  Subglottic edema/ compromised airway- unclear etiology. S/p fiberoptic nasal intubation by ENT in OR 6/18. Of note had similar previous episode ~1 year ago. With bacteremia question epiglottitis. 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   Low C-1 esterase and similar angioedema reactions possible with infection. Group B streptococcal bacteremia source unclear, may be transient due to vascular access.  Rule out fistula infection.  See ID note. ESRD on HD DM: A1c 9 HTN- on multiple home meds Chronic moderate protein  energy malnutrition Carotid stenosis on imaging Dysphagia BRBPR- in context of constipation question hemorrhoids, HD stable  Plan: - Ancef x 6 weeks - Do not think TEE benefits (?abx duration) outweigh risks given her upper airway anatomy - Will touch  base with ID regarding their question for vascular surgery - PT/OT/SLP evaluations - Strengthen bowel regimen, continue PPI, monitor CBC and stool output - iHD per nephrology  Stable for transfer out of ICU, remaining issues: (1) PT/OT/SLP input, (2) ? OP IV abx plan (3) ?vascular surgery input (4) make sure BRBPR stops  Myrla Halsted MD PCCM

## 2023-03-18 NOTE — Evaluation (Signed)
Clinical/Bedside Swallow Evaluation Patient Details  Name: Susan Evans MRN: [id number] Date of Birth: Nov 16, 1955  Today's Date: 03/18/2023 Time: SLP Start Time (ACUTE ONLY): 1031 SLP Stop Time (ACUTE ONLY): 1039 SLP Time Calculation (min) (ACUTE ONLY): 8 min  Past Medical History:  Past Medical History:  Diagnosis Date   AKI (acute kidney injury) (HCC) 08/12/2019   Anaphylactic shock, unspecified, initial encounter 05/09/2020   Chronic kidney disease    Dialysis T/Th/Sat- [address]   CKD stage G1/A3, GFR > 90 and albumin creatinine ratio >300 mg/g 07/08/2016   Nephrotic range proteinuria   Diabetes mellitus    type 2    Dyspnea    "when I have too much fluid."   Ganglion cyst    right wrist   GERD (gastroesophageal reflux disease)    History of radiation therapy 02/27/10 -04/13/10   left base of tongue, bilat neck   Hyperlipidemia    Hypertension    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/21/2021   Iron deficiency anemia    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   Menopause    Thyroid disease    Hyperthyroidism   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/08/2020   Tobacco abuse    PT HAS QUIT   Past Surgical History:  Past Surgical History:  Procedure Laterality Date   AV FISTULA PLACEMENT Left 06/02/2020   Procedure: INSERTION OF ARTERIOVENOUS (AV) GORE-TEX GRAFT ARM LEFT;  Surgeon: Cephus Shelling, MD;  Location: [facility] OR;  Service: Vascular;  Laterality: Left;   AV FISTULA PLACEMENT Right 12/27/2020   Procedure: RIGHT ARM CEPHALIC ARTERIOVENOUS FISTULA CREATION;  Surgeon: Maeola Harman, MD;  Location: [facility] OR;  Service: Vascular;  Laterality: Right;   AVGG REMOVAL Left 12/21/2021   Procedure: EXCISION OF LEFT ARM ARTERIOVENOUS GORETEX GRAFT (AVGG);  Surgeon: Maeola Harman, MD;  Location: [facility] OR;  Service: Vascular;  Laterality: Left;   IR FLUORO GUIDE CV LINE LEFT  05/15/2020   IR REMOVAL TUN CV  CATH W/O FL  05/15/2020   IR REMOVAL TUN CV CATH W/O FL  06/15/2021   IR US GUIDE VASC ACCESS LEFT  05/15/2020   NECK SURGERY     REDUCTION MAMMAPLASTY     TRACHEOSTOMY TUBE PLACEMENT N/A 03/11/2023   Procedure: AWAKE INTUBATION;  Surgeon: Christia Reading, MD;  Location: [facility] OR;  Service: ENT;  Laterality: N/A;   HPI:  Susan Evans is a 67yo female who presented 6/18 with lip swelling noted at dialysis where they attempted extra volume removal but she did not tolerate this r/t tachycardia and malaise so she was sent to ER where her lip swelling progressed. She also developed SOB and hoarse voice and ENT was consulted. She was ultimately taken to OR and nasally intubated by ENT. ETT 6/18-24. 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    Assessment / Plan / Recommendation  Clinical Impression  Pt presents with clinical indicators of pharyngeal dysphagia s/p 6 day intubation following glottic edema with remote hx of XRT for H&N Ca in 2011.  Pt exhibited frequent cough and throat clear with thin liquids by spoon and straw.  There was delayed cough following puree by spoon as well.  Pt denies hx dysphagia PTA.  Pt's vocal quality is strong, but she endorses hoarsenss.  Discussed instrumental testing options with pt who declines FEES and would prefer MBS.  Will plan for instrumental assessment prior  to intiation of PO diet.  Recommend pt remain NPO with alternate means of nutrition, hydration, and medication. Pt may have ice chips and small sips of unthickened water in moderation, after good oral care, when fully awake/alert, with upright positioning and direct supervision.   SLP Visit Diagnosis: Dysphagia, unspecified (R13.10)    Aspiration Risk  Moderate aspiration risk    Diet Recommendation NPO;Alternative means - temporary   Medication Administration: Via alternative means    Other  Recommendations Oral Care Recommendations: Oral care QID     Recommendations for follow up therapy are one component of a multi-disciplinary discharge planning process, led by the attending physician.  Recommendations may be updated based on patient status, additional functional criteria and insurance authorization.  Follow up Recommendations  (TBD)      Assistance Recommended at Discharge  N/A  Functional Status Assessment  (TBD)  Frequency and Duration  (TBD)          Prognosis Prognosis for improved oropharyngeal function:  (TBD)      Swallow Study   General Date of Onset: 03/11/23 HPI: Susan Evans is a 67yo female who presented 6/18 with lip swelling noted at dialysis where they attempted extra volume removal but she did not tolerate this r/t tachycardia and malaise so she was sent to ER where her lip swelling progressed. She also developed SOB and hoarse voice and ENT was consulted. She was ultimately taken to OR and nasally intubated by ENT. ETT 6/18-24. 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 Type of Study: Bedside Swallow Evaluation Previous Swallow Assessment: CSE 12/23/21 with recs for Reg/Thin Diet Prior to this Study: NPO;Cortrak/Small bore NG tube Temperature Spikes Noted: No Respiratory Status: Nasal cannula History of Recent Intubation: Yes Total duration of intubation (days): 6 days Date extubated: 03/17/23 Behavior/Cognition: Alert;Cooperative;Pleasant mood Oral Cavity Assessment: Within Functional Limits Oral Care Completed by SLP: Recent completion by staff Oral Cavity - Dentition: Edentulous (pt reports wearing dentures with intake typically) Patient Positioning: Upright in chair Baseline Vocal Quality: Hoarse Volitional Cough: Weak Volitional Swallow: Able to elicit    Oral/Motor/Sensory Function Overall Oral Motor/Sensory Function: Mild impairment Facial ROM: Within Functional Limits Facial Symmetry: Within Functional Limits Lingual Symmetry: Within  Functional Limits Lingual Strength: Reduced Velum: Within Functional Limits Mandible: Within Functional Limits   Ice Chips Ice chips: Not tested   Thin Liquid Thin Liquid: Impaired Presentation: Spoon;Straw Pharyngeal  Phase Impairments: Decreased hyoid-laryngeal movement;Multiple swallows;Cough - Immediate;Throat Clearing - Immediate;Throat Clearing - Delayed    Nectar Thick Nectar Thick Liquid: Not tested   Honey Thick Honey Thick Liquid: Not tested   Puree Puree: Impaired Presentation: Spoon Pharyngeal Phase Impairments: Cough - Delayed   Solid     Solid: Not tested      Kerrie Pleasure, MA, CCC-SLP Acute Rehabilitation Services Office: [phone] 03/18/2023,11:39 AM

## 2023-03-18 NOTE — Progress Notes (Signed)
[facility] Progress Note  Subjective: seen in ICU, alert, extubated, up in chair. Ox 3.   Vitals:   03/18/23 1107 03/18/23 1130 03/18/23 1200 03/18/23 1257  BP:  98/80 (!) 110/54 (!) 115/54  Pulse:  99 95 96  Resp:    14  Temp: 98.3 F (36.8 C)     TempSrc: Oral     SpO2:  100% 100% 98%  Weight:      Height:        Exam: GEN  NAD, awake, alert, up in chair NECK no JVD PULM - CTA ant/ lat CV RRR ABD soft EXT minimal edema extremities SKIN no rashes ACCESS: R AVG + T/B       Home meds: lipitor, prilosec, renvela 800 mg ac tid, clonidine patch 0.2 weekly, hydralazine 50 bid, verapamil 180 CR 2 tabs at bedtime, prns/ vits/ supps   OP HD: TTS [facility]     4h  350/500   36kg  3K/2.5Ca bath  RUA AVF  Hep ?  - last OP HD 6/18, post wt 38.4  - hectorol 2 mcg IV three times per week  - coming off at dry wt most of last 2 wks   6/21 CXR - no active disease   Assessment/ Plan Angioedema: occurred 12/2021 as well.  Both times this has happened have been in the setting of infection but that's the only common link, not sure that it was the same organism anyway.  CT maxillofacial showed soft tissue swelling/ cellulitis w/o abscess.  No sign of AVF infection on exam.  Pt required nasal intubation by ENT. Pt is getting dexamethasone. C1 esterase inhibitor < 8, but this can occur with sepsis too. Will need OP labs and possibly allergy appt. Pt is already dialyzing on a hypoallergenic dialyzer at OP dialysis for the past year. Will keep this on indefinitely.   Acute resp failure - f/u video laryngoscope done yest per CCM shows improved swelling of the cords. Pt was extubated and is doing well, no indication for reintubating.  ESRD - TTS HD. HD today.  S agalactiae bacteremia: as above in #1, on cefazolin.  ID following, appreciate assistance. VVS consulted to see if there could be any infection in the functioning R AVF. Per VVS, no signs of infection. Appreciate assistance.  HTN/  vol: Clonidine patch stopped for low BP's, BP's back up in reasonable range. Wt's are stable 1-2kg over, euvolemic on exam. UF 2-2.5 w/ next HD.  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 Resume binders/ vits when eating. Cont IV vdra    Rob Arlean Hopping MD  [facility] 03/18/2023, 1:02 PM  Recent Labs  Lab 03/13/23 1610 03/13/23 1802 03/14/23 0810 03/15/23 1327 03/16/23 0122 03/18/23 0133 03/18/23 0431  HGB 9.0*  --   --    < > 10.2*  --  9.7*  ALBUMIN 2.0*  --   --   --   --  2.4*  --   CALCIUM 7.6*  --   --    < > 8.1* 8.9  --   PHOS 6.9*   < > 3.5  --   --  4.2  --   CREATININE 4.39*  --   --    < > 2.37* 4.57*  --   K 4.0  --   --    < > 4.0 4.7  --    < > = values in this interval not displayed.  No results for input(s): "IRON", "TIBC", "FERRITIN" in the last 168 hours. Inpatient medications:  aspirin  81 mg Per Tube Daily   atorvastatin  80 mg Per Tube Daily   carvedilol  6.25 mg Per Tube BID WC   Chlorhexidine Gluconate Cloth  6 each Topical Daily   doxercalciferol  2 mcg Intravenous Q T,Th,Sa-HD   heparin  5,000 Units Subcutaneous Q12H   insulin aspart  0-15 Units Subcutaneous Q4H   multivitamin  1 tablet Per Tube QHS   mouth rinse  15 mL Mouth Rinse Q2H   pantoprazole (PROTONIX) IV  40 mg Intravenous QHS   polyethylene glycol  17 g Per Tube Daily   sevelamer carbonate  800 mg Per Tube TID   thiamine  100 mg Per Tube Daily    sodium chloride Stopped (03/13/23 1623)    ceFAZolin (ANCEF) IV Stopped (03/17/23 2158)   feeding supplement (OSMOLITE 1.5 CAL) 1,000 mL (03/18/23 0838)   sodium chloride, acetaminophen, heparin, hydrALAZINE, labetalol, lidocaine (PF), lidocaine-prilocaine, mouth rinse, pentafluoroprop-tetrafluoroeth

## 2023-03-19 ENCOUNTER — Inpatient Hospital Stay (HOSPITAL_COMMUNITY): Payer: 59

## 2023-03-19 DIAGNOSIS — E43 Unspecified severe protein-calorie malnutrition: Secondary | ICD-10-CM | POA: Diagnosis present

## 2023-03-19 LAB — CBC
HCT: 28.7 % — ABNORMAL LOW (ref 36.0–46.0)
Hemoglobin: 9 g/dL — ABNORMAL LOW (ref 12.0–15.0)
MCH: 24.5 pg — ABNORMAL LOW (ref 26.0–34.0)
MCHC: 31.4 g/dL (ref 30.0–36.0)
MCV: 78.2 fL — ABNORMAL LOW (ref 80.0–100.0)
Platelets: 188 10*3/uL (ref 150–400)
RBC: 3.67 MIL/uL — ABNORMAL LOW (ref 3.87–5.11)
RDW: 14.5 % (ref 11.5–15.5)
WBC: 9.8 10*3/uL (ref 4.0–10.5)
nRBC: 0 % (ref 0.0–0.2)

## 2023-03-19 LAB — GLUCOSE, CAPILLARY
Glucose-Capillary: 199 mg/dL — ABNORMAL HIGH (ref 70–99)
Glucose-Capillary: 181 mg/dL — ABNORMAL HIGH (ref 70–99)
Glucose-Capillary: 211 mg/dL — ABNORMAL HIGH (ref 70–99)
Glucose-Capillary: 133 mg/dL — ABNORMAL HIGH (ref 70–99)
Glucose-Capillary: 128 mg/dL — ABNORMAL HIGH (ref 70–99)
Glucose-Capillary: 259 mg/dL — ABNORMAL HIGH (ref 70–99)

## 2023-03-19 LAB — RENAL FUNCTION PANEL
Albumin: 2.3 g/dL — ABNORMAL LOW (ref 3.5–5.0)
Anion gap: 10 (ref 5–15)
BUN: 33 mg/dL — ABNORMAL HIGH (ref 8–23)
CO2: 30 mmol/L (ref 22–32)
Calcium: 8.4 mg/dL — ABNORMAL LOW (ref 8.9–10.3)
Chloride: 96 mmol/L — ABNORMAL LOW (ref 98–111)
Creatinine, Ser: 3.14 mg/dL — ABNORMAL HIGH (ref 0.44–1.00)
GFR, Estimated: 16 mL/min — ABNORMAL LOW (ref >60)
Glucose, Bld: 152 mg/dL — ABNORMAL HIGH (ref 70–99)
Phosphorus: 3.4 mg/dL (ref 2.5–4.6)
Potassium: 4.7 mmol/L (ref 3.5–5.1)
Sodium: 136 mmol/L (ref 135–145)

## 2023-03-19 MED ORDER — LORATADINE 10 MG PO TABS
10.0000 mg | ORAL_TABLET | Freq: Every day | ORAL | Status: DC
  Administered 2023-03-19 – 2023-03-31 (×11): 10 mg
  Filled 2023-03-19 (×12): qty 1

## 2023-03-19 MED ORDER — ALUM & MAG HYDROXIDE-SIMETH 200-200-20 MG/5ML PO SUSP
15.0000 mL | Freq: Once | ORAL | Status: AC
  Administered 2023-03-19: 15 mL
  Filled 2023-03-19: qty 30

## 2023-03-19 MED ORDER — HYDRALAZINE HCL 50 MG PO TABS
50.0000 mg | ORAL_TABLET | Freq: Two times a day (BID) | ORAL | Status: DC
  Administered 2023-03-19 – 2023-03-23 (×8): 50 mg
  Filled 2023-03-19 (×8): qty 1

## 2023-03-19 MED ORDER — CEFAZOLIN SODIUM-DEXTROSE 2-4 GM/100ML-% IV SOLN
2.0000 g | INTRAVENOUS | Status: DC
  Administered 2023-03-20 – 2023-03-29 (×6): 2 g via INTRAVENOUS
  Filled 2023-03-19 (×6): qty 100

## 2023-03-19 MED ORDER — CHLORHEXIDINE GLUCONATE CLOTH 2 % EX PADS
6.0000 | MEDICATED_PAD | Freq: Every day | CUTANEOUS | Status: DC
  Administered 2023-03-20 – 2023-03-22 (×3): 6 via TOPICAL

## 2023-03-19 MED ORDER — CEFAZOLIN IV (FOR PTA / DISCHARGE USE ONLY): 2.0000 g | INTRAVENOUS | 0 refills | Status: DC

## 2023-03-19 MED ORDER — FAMOTIDINE 20 MG PO TABS
10.0000 mg | ORAL_TABLET | Freq: Every day | ORAL | Status: DC
  Administered 2023-03-19 – 2023-03-31 (×12): 10 mg
  Filled 2023-03-19 (×12): qty 1

## 2023-03-19 MED ORDER — SODIUM CHLORIDE 3 % IN NEBU
4.0000 mL | INHALATION_SOLUTION | Freq: Every day | RESPIRATORY_TRACT | Status: AC
  Administered 2023-03-19 – 2023-03-21 (×3): 4 mL via RESPIRATORY_TRACT
  Filled 2023-03-19 (×3): qty 4

## 2023-03-19 MED ORDER — ORAL CARE MOUTH RINSE: 15.0000 mL | OROMUCOSAL | Status: DC | PRN

## 2023-03-19 MED ORDER — FAMOTIDINE IN NACL 20-0.9 MG/50ML-% IV SOLN
20.0000 mg | Freq: Once | INTRAVENOUS | Status: AC
  Administered 2023-03-19: 20 mg via INTRAVENOUS
  Filled 2023-03-19: qty 50

## 2023-03-19 NOTE — Progress Notes (Signed)
CPT held this round due to pt being in recliner.  

## 2023-03-19 NOTE — Progress Notes (Signed)
SLP Cancellation Note  Patient Details Name: Susan Evans MRN: [id number] DOB: 18-Jan-1956   Cancelled treatment:        Spoke with RN.  Pt requiring O2 via venturi mask today and is not appropriate for PO trials.  SLP will continue to follow for dysphagia.    Kerrie Pleasure, MA, CCC-SLP Acute Rehabilitation Services Office: [phone] 03/19/2023, 11:34 AM

## 2023-03-19 NOTE — Progress Notes (Signed)
Physical Therapy Treatment Patient Details Name: Susan Evans MRN: [id number] DOB: [birth year] Today's Date: 03/19/2023   History of Present Illness 67 yo female presents to [facility] on 6/18 with lip swelling with subglottic edema/ compromised airway, s/p fiberoptic intubation 6/18-6/24. Other dx includes bacteremia. 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    PT Comments    Pt greeted resting in bed, eager for session and OOB mobility. Pt requiring grossly min guard for mobility without need for AD support during gait with cues for safety, self pacing, energy conservation and breathing techniques, and assist for lines/leads. Pt continues to be limited by decreased activity tolerance, decreased cardiopulmonary endurance and impaired balance/postural reactions. VSS throughout activity on supplemental O2, 15L via NRD mask with 15L 55% FiO2 venturi mask replaced at end of session. Pt continues to benefit from skilled PT services to progress toward functional mobility goals.    Recommendations for follow up therapy are one component of a multi-disciplinary discharge planning process, led by the attending physician.  Recommendations may be updated based on patient status, additional functional criteria and insurance authorization.  Follow Up Recommendations       Assistance Recommended at Discharge Intermittent Supervision/Assistance  Patient can return home with the following A little help with walking and/or transfers;A little help with bathing/dressing/bathroom   Equipment Recommendations  None recommended by PT    Recommendations for Other Services       Precautions / Restrictions Precautions Precautions: Fall Restrictions Weight Bearing Restrictions: No     Mobility  Bed Mobility Overal bed mobility: Needs Assistance Bed Mobility: Supine to Sit, Sit to Supine     Supine to  sit: Supervision, HOB elevated     General bed mobility comments: assist with lines/leads    Transfers Overall transfer level: Needs assistance Equipment used: None Transfers: Sit to/from Stand Sit to Stand: Min guard           General transfer comment: close guard for safety, no physical assist    Ambulation/Gait Ambulation/Gait assistance: Min guard Gait Distance (Feet): 200 Feet Assistive device: None Gait Pattern/deviations: Step-through pattern, Decreased stride length Gait velocity: decr     General Gait Details: slowed, but steady gait, no AD, 15L NRB mask with sats maintaining >96% throughout, HR up to 101bpm during activity   Stairs             Wheelchair Mobility    Modified Rankin (Stroke Patients Only)       Balance Overall balance assessment: Needs assistance Sitting-balance support: No upper extremity supported, Feet supported Sitting balance-Leahy Scale: Fair     Standing balance support: No upper extremity supported, During functional activity Standing balance-Leahy Scale: Fair                              Cognition Arousal/Alertness: Awake/alert Behavior During Therapy: WFL for tasks assessed/performed Overall Cognitive Status: Within Functional Limits for tasks assessed                                          Exercises General Exercises - Lower Extremity Long Arc Quad: AROM, Right, Left, 10 reps, Seated Hip Flexion/Marching: AROM, Right, Left, 20 reps, Seated    General Comments General comments (skin integrity, edema, etc.):  VSS on supplemental O2, pt on 15L 55% venturi mask on arrival, 15L NRB mask donned during gait with SpO2 maintaining >96%, replaced venturi mask and 15L at 55% at end of session with SpO2 94%      Pertinent Vitals/Pain Pain Assessment Pain Assessment: No/denies pain Faces Pain Scale: No hurt Pain Intervention(s): Monitored during session    Home Living                           Prior Function            PT Goals (current goals can now be found in the care plan section) Acute Rehab PT Goals Patient Stated Goal: home PT Goal Formulation: With patient Time For Goal Achievement: 04/01/23 Progress towards PT goals: Progressing toward goals    Frequency    Min 2X/week      PT Plan      Co-evaluation              AM-PAC PT "6 Clicks" Mobility   Outcome Measure  Help needed turning from your back to your side while in a flat bed without using bedrails?: A Little Help needed moving from lying on your back to sitting on the side of a flat bed without using bedrails?: A Little Help needed moving to and from a bed to a chair (including a wheelchair)?: A Little Help needed standing up from a chair using your arms (e.g., wheelchair or bedside chair)?: A Little Help needed to walk in hospital room?: A Little Help needed climbing 3-5 steps with a railing? : A Lot 6 Click Score: 17    End of Session Equipment Utilized During Treatment: Gait belt Activity Tolerance: Patient tolerated treatment well Patient left: with call bell/phone within reach;in chair;with chair alarm set Nurse Communication: Mobility status PT Visit Diagnosis: Other abnormalities of gait and mobility (R26.89);Muscle weakness (generalized) (M62.81)     Time: 1324-4010 PT Time Calculation (min) (ACUTE ONLY): 29 min  Charges:  $Gait Training: 8-22 mins $Therapeutic Activity: 8-22 mins                     Tyris Eliot R. PTA Acute Rehabilitation Services Office: [phone]   Catalina Antigua 03/19/2023, 2:11 PM

## 2023-03-19 NOTE — Progress Notes (Addendum)
[facility] Progress Note  Subjective: seen in ICU, alert, extubated, up in chair. Ox 3.   Vitals:   03/19/23 0830 03/19/23 0839 03/19/23 1130 03/19/23 1206  BP: (!) 182/73     Pulse: 95   90  Resp: 18   (!) 23  Temp:   97.6 F (36.4 C)   TempSrc:   Axillary   SpO2: 95% 99%  92%  Weight:      Height:        Exam: GEN  NAD, awake, alert, up in chair NECK no JVD PULM - CTA ant/ lat CV RRR ABD soft EXT minimal edema extremities SKIN no rashes ACCESS: R AVG + T/B       Home meds: lipitor, prilosec, renvela 800 mg ac tid, clonidine patch 0.2 weekly, hydralazine 50 bid, verapamil 180 CR 2 tabs at bedtime, prns/ vits/ supps   OP HD: TTS [facility]     4h  350/500   36kg  3K/2.5Ca bath  RUA AVF  Hep 2000+ 2000 midrun  - last OP HD 6/18, post wt 38.4  - hectorol 2 mcg IV three times per week  - coming off at dry wt most of last 2 wks   6/26 CXR - no active disease   Assessment/ Plan Angioedema: occurred 12/2021 as well.  Both times this has happened have been in the setting of infection but that's the only common link, not sure that it was the same organism anyway.  CT maxillofacial showed soft tissue swelling/ cellulitis w/o abscess.  No sign of AVF infection on exam.  Pt required nasal intubation by ENT. Pt is getting dexamethasone. C1 esterase inhibitor < 8, but this can occur with sepsis too. Will need OP labs and possibly allergy appt. Pt is already dialyzing on a hypoallergenic dialyzer at OP dialysis for the past year. Will keep this on indefinitely.   Acute resp failure - f/u video laryngoscope done yest per CCM shows improved swelling of the cords. Pt was extubated and is doing well, no indication for reintubating.  ESRD - TTS HD. HD tomorrow.  S agalactiae bacteremia: as above in #1, on cefazolin.  Per pharm and ID she will need IV Ancef 2gm post HD TTS through 7/31. VVS consulted to assess her AVF, they found no signs of infection. Appreciate assistance.  HTN/  vol: Clonidine patch stopped for low BP's. Current BP's wnl, some on the high side, getting per tube coreg and hydralazine.  Anemia of ESRD: Hgb 9's. Will consider adding esa's.  Metabolic Bone Disease: Ca/ phos in range. Resume binders/ vits when eating. Cont IV vdra Nutrition - pt is getting TF's w/ Osmolite. K+ levels are okay today in the upper 4s.     Vinson Moselle MD  [facility] 03/19/2023, 1:31 PM  Recent Labs  Lab 03/18/23 0133 03/18/23 0431 03/19/23 0102  HGB  --  9.7* 9.0*  ALBUMIN 2.4*  --  2.3*  CALCIUM 8.9  --  8.4*  PHOS 4.2  --  3.4  CREATININE 4.57*  --  3.14*  K 4.7  --  4.7    No results for input(s): "IRON", "TIBC", "FERRITIN" in the last 168 hours. Inpatient medications:  aspirin  81 mg Per Tube Daily   atorvastatin  80 mg Per Tube Daily   carvedilol  6.25 mg Per Tube BID WC   Chlorhexidine Gluconate Cloth  6 each Topical Daily   doxercalciferol  2 mcg Intravenous Q T,Th,Sa-HD   famotidine  10  mg Per Tube Daily   heparin  5,000 Units Subcutaneous Q12H   hydrALAZINE  50 mg Per Tube BID   insulin aspart  0-15 Units Subcutaneous Q4H   loratadine  10 mg Per Tube Daily   multivitamin  1 tablet Per Tube QHS   polyethylene glycol  17 g Per Tube Daily   sevelamer carbonate  800 mg Per Tube TID   sodium chloride HYPERTONIC  4 mL Nebulization Daily   thiamine  100 mg Per Tube Daily    sodium chloride Stopped (03/13/23 1623)    ceFAZolin (ANCEF) IV Stopped (03/18/23 2235)   [START ON 03/20/2023]  ceFAZolin (ANCEF) IV     feeding supplement (OSMOLITE 1.5 CAL) 45 mL/hr at 03/19/23 0800   sodium chloride, acetaminophen, heparin, lidocaine (PF), lidocaine-prilocaine, mouth rinse, mouth rinse, pentafluoroprop-tetrafluoroeth, polyvinyl alcohol

## 2023-03-19 NOTE — Progress Notes (Signed)
Cortrak Tube Team Note:  Consult received to place a bridle on previously placed Cortrak tube.   Bridle placed, tube secured back at previous marking of 63cm.   If the tube becomes dislodged please keep the tube and contact the Cortrak team at [email or web address] for replacement.  If after hours and replacement cannot be delayed, place a NG tube and confirm placement with an abdominal x-ray.    Shelle Iron RD, LDN For contact information, refer to [facility].

## 2023-03-20 ENCOUNTER — Inpatient Hospital Stay (HOSPITAL_COMMUNITY): Payer: 59

## 2023-03-20 ENCOUNTER — Other Ambulatory Visit: Payer: Self-pay

## 2023-03-20 DIAGNOSIS — T783XXA Angioneurotic edema, initial encounter: Secondary | ICD-10-CM

## 2023-03-20 DIAGNOSIS — I10 Essential (primary) hypertension: Secondary | ICD-10-CM

## 2023-03-20 DIAGNOSIS — B951 Streptococcus, group B, as the cause of diseases classified elsewhere: Secondary | ICD-10-CM | POA: Diagnosis not present

## 2023-03-20 DIAGNOSIS — R7881 Bacteremia: Secondary | ICD-10-CM | POA: Diagnosis not present

## 2023-03-20 HISTORY — DX: Angioneurotic edema, initial encounter: T78.3XXA

## 2023-03-20 LAB — CBC
HCT: 34.2 % — ABNORMAL LOW (ref 36.0–46.0)
Hemoglobin: 10.4 g/dL — ABNORMAL LOW (ref 12.0–15.0)
MCH: 24.3 pg — ABNORMAL LOW (ref 26.0–34.0)
MCHC: 30.4 g/dL (ref 30.0–36.0)
MCV: 79.9 fL — ABNORMAL LOW (ref 80.0–100.0)
Platelets: 223 10*3/uL (ref 150–400)
RBC: 4.28 MIL/uL (ref 3.87–5.11)
RDW: 14.6 % (ref 11.5–15.5)
WBC: 10.9 10*3/uL — ABNORMAL HIGH (ref 4.0–10.5)
nRBC: 0 % (ref 0.0–0.2)

## 2023-03-20 LAB — RENAL FUNCTION PANEL
Albumin: 2.7 g/dL — ABNORMAL LOW (ref 3.5–5.0)
Anion gap: 12 (ref 5–15)
BUN: 26 mg/dL — ABNORMAL HIGH (ref 8–23)
CO2: 28 mmol/L (ref 22–32)
Calcium: 8.5 mg/dL — ABNORMAL LOW (ref 8.9–10.3)
Chloride: 92 mmol/L — ABNORMAL LOW (ref 98–111)
Creatinine, Ser: 2.39 mg/dL — ABNORMAL HIGH (ref 0.44–1.00)
GFR, Estimated: 22 mL/min — ABNORMAL LOW (ref >60)
Glucose, Bld: 248 mg/dL — ABNORMAL HIGH (ref 70–99)
Phosphorus: 2.5 mg/dL (ref 2.5–4.6)
Potassium: 4.8 mmol/L (ref 3.5–5.1)
Sodium: 132 mmol/L — ABNORMAL LOW (ref 135–145)

## 2023-03-20 LAB — GLUCOSE, CAPILLARY
Glucose-Capillary: 169 mg/dL — ABNORMAL HIGH (ref 70–99)
Glucose-Capillary: 240 mg/dL — ABNORMAL HIGH (ref 70–99)
Glucose-Capillary: 170 mg/dL — ABNORMAL HIGH (ref 70–99)
Glucose-Capillary: 206 mg/dL — ABNORMAL HIGH (ref 70–99)
Glucose-Capillary: 166 mg/dL — ABNORMAL HIGH (ref 70–99)

## 2023-03-20 LAB — POTASSIUM: Potassium: 5.2 mmol/L — ABNORMAL HIGH (ref 3.5–5.1)

## 2023-03-20 MED ORDER — CALCIUM GLUCONATE-NACL 1-0.675 GM/50ML-% IV SOLN
1.0000 g | Freq: Once | INTRAVENOUS | Status: AC
  Administered 2023-03-20: 1000 mg via INTRAVENOUS
  Filled 2023-03-20: qty 50

## 2023-03-20 MED ORDER — HEPARIN SODIUM (PORCINE) 1000 UNIT/ML DIALYSIS
2000.0000 [IU] | Freq: Once | INTRAMUSCULAR | Status: AC
  Administered 2023-03-22: 2000 [IU] via INTRAVENOUS_CENTRAL
  Filled 2023-03-20 (×2): qty 2

## 2023-03-20 MED ORDER — HEPARIN SODIUM (PORCINE) 1000 UNIT/ML DIALYSIS
2000.0000 [IU] | INTRAMUSCULAR | Status: DC | PRN
  Filled 2023-03-20: qty 2

## 2023-03-20 NOTE — Evaluation (Signed)
Occupational Therapy Evaluation Patient Details Name: Susan Evans MRN: [id number] DOB: 12-22-1955 Today's Date: 03/20/2023   History of Present Illness 67 yo female presents to [facility] on 6/18 with lip swelling with subglottic edema/ compromised airway, s/p fiberoptic intubation 6/18-6/24. Other dx includes bacteremia. 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   Clinical Impression   Pt is typically mod I for ADL and mobility. Has an aide that assists with IADL 3 days/wk. Today she is overall min guard assist for ADL and transfers presenting with deficits in balance, activity tolerance, and demonstrating generalized weakness. Pt with good awareness of lines and management but requesting hand held assist for transfers and mobility. Pt on RA throughout the session and SpO2 dropped during mobilization to 87%, with quick recovery to >90% upon seated rest break. At this time recommending shower chair (or 3 in 1) for safety and energy conservation with bathing, as well as continued OT in the acute setting with next session to focus on EC strategies as well as OOB ADL to work on strength/activity tolerance. Recommending post-acute OT at the [facility] level to maximize safety and independence in ADL and functional transfers in natural environment.       Recommendations for follow up therapy are one component of a multi-disciplinary discharge planning process, led by the attending physician.  Recommendations may be updated based on patient status, additional functional criteria and insurance authorization.   Assistance Recommended at Discharge Intermittent Supervision/Assistance  Patient can return home with the following A little help with walking and/or transfers;A little help with bathing/dressing/bathroom;Assistance with cooking/housework;Direct supervision/assist for medications management;Assist for  transportation;Help with stairs or ramp for entrance (has an aide at baseline)    Functional Status Assessment  Patient has had a recent decline in their functional status and demonstrates the ability to make significant improvements in function in a reasonable and predictable amount of time.  Equipment Recommendations  BSC/3in1;Tub/shower seat (either/OR - shower seat preferred)    Recommendations for Other Services PT consult     Precautions / Restrictions Precautions Precautions: Fall Restrictions Weight Bearing Restrictions: No      Mobility Bed Mobility Overal bed mobility: Needs Assistance Bed Mobility: Supine to Sit, Sit to Supine     Supine to sit: Supervision, HOB elevated Sit to supine: Supervision   General bed mobility comments: assist with lines/leads    Transfers Overall transfer level: Needs assistance Equipment used: None Transfers: Sit to/from Stand Sit to Stand: Min guard           General transfer comment: close guard for safety, no physical assist      Balance Overall balance assessment: Needs assistance Sitting-balance support: No upper extremity supported, Feet supported Sitting balance-Leahy Scale: Fair     Standing balance support: No upper extremity supported, During functional activity Standing balance-Leahy Scale: Fair                             ADL either performed or assessed with clinical judgement   ADL Overall ADL's : Needs assistance/impaired Eating/Feeding: NPO   Grooming: Wash/dry hands;Min guard;Standing Grooming Details (indicate cue type and reason): sink level Upper Body Bathing: Minimal assistance;Sitting Upper Body Bathing Details (indicate cue type and reason): for back Lower Body Bathing: Min guard;Sitting/lateral leans   Upper Body Dressing : Set up   Lower Body Dressing: Set up Lower  Body Dressing Details (indicate cue type and reason): able to perform figure 4 to don socks Toilet Transfer:  Minimal assistance;Ambulation Toilet Transfer Details (indicate cue type and reason): requested to hold hand, and wanted to manage her own lines Toileting- Clothing Manipulation and Hygiene: Min guard;Sit to/from stand   Tub/ Engineer, structural: Minimal assistance   Functional mobility during ADLs: Minimal assistance (1 person HHA) General ADL Comments: decreased activity tolerance, balance     Vision   Vision Assessment?: No apparent visual deficits     Perception     Praxis      Pertinent Vitals/Pain Pain Assessment Pain Assessment: No/denies pain Faces Pain Scale: No hurt Pain Intervention(s): Monitored during session, Repositioned     Hand Dominance Right   Extremity/Trunk Assessment Upper Extremity Assessment Upper Extremity Assessment: Generalized weakness   Lower Extremity Assessment Lower Extremity Assessment: Defer to PT evaluation   Cervical / Trunk Assessment Cervical / Trunk Assessment: Kyphotic;Other exceptions Cervical / Trunk Exceptions: forward head, rounded shoulders   Communication Communication Communication: No difficulties   Cognition Arousal/Alertness: Awake/alert Behavior During Therapy: WFL for tasks assessed/performed Overall Cognitive Status: Within Functional Limits for tasks assessed                                       General Comments  Pt on RA throughout session, desaturated to 87% with activity. with seated rest break returns to >90% within 10 seconds.    Exercises     Shoulder Instructions      Home Living Family/patient expects to be discharged to:: Private residence Living Arrangements: Spouse/significant other Available Help at Discharge: Family;Friend(s) Type of Home: Apartment Home Access: Level entry     Home Layout: One level     Bathroom Shower/Tub: Chief Strategy Officer: Standard     Home Equipment: None          Prior Functioning/Environment Prior Level of Function : Needs  assist             Mobility Comments: pt reports indep with mobility ADLs Comments: per pt, pt has an aide to assist with cooking and cleaning on MWF, states she helps with dressing bathing "if she needs it"        OT Problem List: Decreased strength;Decreased activity tolerance;Impaired balance (sitting and/or standing);Decreased knowledge of use of DME or AE      OT Treatment/Interventions: Self-care/ADL training;DME and/or AE instruction;Energy conservation;Therapeutic exercise;Patient/family education;Balance training    OT Goals(Current goals can be found in the care plan section) Acute Rehab OT Goals Patient Stated Goal: get a Rollator and shower chair to ease transition home OT Goal Formulation: With patient Time For Goal Achievement: 04/03/23 Potential to Achieve Goals: Good ADL Goals Pt Will Perform Grooming: standing;with modified independence Pt Will Perform Upper Body Dressing: with modified independence;sitting Pt Will Perform Lower Body Dressing: sit to/from stand;with supervision Pt Will Transfer to Toilet: with supervision;ambulating;regular height toilet Pt Will Perform Toileting - Clothing Manipulation and hygiene: with supervision;sit to/from stand Additional ADL Goal #1: Pt will verbalize at least 3 strategies for enegy conservation during ADL with no cues  OT Frequency: Min 2X/week    Co-evaluation              AM-PAC OT "6 Clicks" Daily Activity     Outcome Measure Help from another person eating meals?: Total (NPO) Help from another person taking care of  personal grooming?: A Little Help from another person toileting, which includes using toliet, bedpan, or urinal?: A Little Help from another person bathing (including washing, rinsing, drying)?: A Little Help from another person to put on and taking off regular upper body clothing?: A Little Help from another person to put on and taking off regular lower body clothing?: A Little 6 Click Score:  16   End of Session Equipment Utilized During Treatment: Gait belt Nurse Communication: Mobility status;Precautions  Activity Tolerance: Patient tolerated treatment well Patient left: in chair;with call bell/phone within reach;with chair alarm set  OT Visit Diagnosis: Unsteadiness on feet (R26.81);Muscle weakness (generalized) (M62.81)                Time: 9147-8295 OT Time Calculation (min): 25 min Charges:  OT General Charges $OT Visit: 1 Visit OT Evaluation $OT Eval Moderate Complexity: 1 Mod OT Treatments $Self Care/Home Management : 8-22 mins  Nyoka Cowden OTR/L Acute Rehabilitation Services Office: [phone]  [name] [facility] 03/20/2023, 1:54 PM

## 2023-03-20 NOTE — Progress Notes (Signed)
HD#9 Subjective:   Summary: This is a 67 year old female with a past medical history of hypertension, type 2 diabetes, ESRD on dialysis TTS who presented with concerns of difficulty breathing.  Found to have angioedema that required intubation.  Patient was followed in the ICU.  Blood cultures resulted positive for group B strep.  Patient since been extubated and transferred to the floor.  Patient admitted for further evaluation of acute hypoxic respiratory failure as well as group B strep bacteremia.  Overnight Events: No overnight events   Patient evaluated at recliner side this morning.  She states she is doing better.  She denies any respiratory distress.  She denies any shortness of breath.  She denies any chest pain.  She wants something to eat.  She thinks her swelling has improved.  Objective:  Vital signs in last 24 hours: Vitals:   03/20/23 0401 03/20/23 0641 03/20/23 0800 03/20/23 0925  BP: 100/86 (!) 140/111 95/77 124/74  Pulse:  100 98 96  Resp:  14 17   Temp:   98.3 F (36.8 C)   TempSrc:   Oral   SpO2:  95% 98%   Weight:      Height:       Supplemental O2: Room Air SpO2: 98 %   Physical Exam:  Constitutional: Chronically ill-appearing, resting in recliner, no acute distress.  Core track in place, no longer on nasal cannula HENT: normocephalic atraumatic, able to visualize palate and uvula Cardiovascular: regular rate and rhythm, no m/r/g Pulmonary/Chest: Decreased inspiratory effort, clear to auscultation bilaterally Extremities: No pitting edema appreciated  Filed Weights   03/16/23 0500 03/17/23 0500 03/19/23 0500  Weight: 38.4 kg 38.6 kg 38.5 kg     Intake/Output Summary (Last 24 hours) at 03/20/2023 1035 Last data filed at 03/20/2023 0500 Gross per 24 hour  Intake 405 ml  Output 1700 ml  Net -1295 ml   Net IO Since Admission: 1,828.91 mL [03/20/23 1035]  Pertinent Labs:    Latest Ref Rng & Units 03/20/2023    8:34 AM 03/19/2023    1:02 AM  03/18/2023    4:31 AM  CBC  WBC 4.0 - 10.5 K/uL 10.9  9.8  13.3   Hemoglobin 12.0 - 15.0 g/dL 00.9  9.0  9.7   Hematocrit 36.0 - 46.0 % 34.2  28.7  30.3   Platelets 150 - 400 K/uL 223  188  183        Latest Ref Rng & Units 03/20/2023    8:34 AM 03/19/2023    1:02 AM 03/18/2023    1:33 AM  CMP  Glucose 70 - 99 mg/dL 381  829  937   BUN 8 - 23 mg/dL 26  33  54   Creatinine 0.44 - 1.00 mg/dL 1.69  6.78  9.38   Sodium 135 - 145 mmol/L 132  136  134   Potassium 3.5 - 5.1 mmol/L 4.8  4.7  4.7   Chloride 98 - 111 mmol/L 92  96  93   CO2 22 - 32 mmol/L 28  30  27    Calcium 8.9 - 10.3 mg/dL 8.5  8.4  8.9     Imaging: No results found.  Assessment/Plan:   Principal Problem:   Bacteremia due to group B Streptococcus Active Problems:   Type 2 diabetes mellitus with complication (HCC)   Essential hypertension   ESRD (end stage renal disease) (HCC)   Acute hypoxic respiratory failure (HCC)   Microcytic anemia  Malignant neoplasm of pharynx, unspecified (HCC)   Protein-calorie malnutrition, severe   Patient Summary: Susan Evans is a 67 y.o. female with a past medical history of hypertension, type 2 diabetes, ESRD on dialysis TTS who presented with concerns of difficulty breathing.  Found to have angioedema that required intubation.  Patient was followed in the ICU.  Blood cultures resulted positive for group B strep.  Patient since been extubated and transferred to the floor.  Patient admitted for further evaluation of acute hypoxic respiratory failure as well as group B strep bacteremia.  #Group B strep bacteremia Patient evaluated by infectious disease.  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  They are recommending outpatient TEE follow-up.  Patient to continue IV antibiotics with cefazolin until 04/23/2023.  Repeat blood cultures have been negative.  Patient remains afebrile.  Unclear  source, patient was evaluated by VVS who states patient can benefit from right arm fistulogram, but do not think that this is the source of infection. -Continue IV cefazolin -End date of antibiotics 04/23/2023 -Monitor fever curve  #Angioedema #Epiglottis edema #History of radiation therapy to neck Patient is improving slowly.  Patient still does have some edema left.  Patient had a previous episode in 12/2021 of angioedema.  Etiology is unclear.  Thought to be infectious versus allergic versus hereditary.  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  Speech evaluated patient today with modified barium swallow, which patient did not do well at all.  Will keep her n.p.o.  Will need to have goals of care conversation with this patient about potential long-term feeding tube.  Will also reach out to ENT today to see if they can potentially rescope her and offer any other options. -C4 pending -Speech following -ENT following -Monitor for worsening respiratory distress -Talk about long-term feeding  #Acute hypoxic respiratory failure 2/2 angioedema, improving  Patient is down to room air patient did require Venturi mask yesterday, but from a respiratory standpoint is improving.  Will continue to monitor. -Incentive spirometry -Hypertonic saline -Chest PT  #Malnutrition Currently on tube feeds given dysphagia from edema.  Patient was to follow-up with speech yesterday, but given her respiratory distress at this time, speech evaluation was canceled.  Speech eval today showed patient has very poor swallowing function.  This probably is an combination of angioedema, epiglottis edema, as well as history of radiation therapy to neck.  Will speak with patient about thoughts about continuing with tube feeds with permanent access versus not. -Continue tube for now -Talk to patient about permanent access -Reach out to ENT for other options to evaluate such as rescope  #ESRD on HD TTS Patient had  dialysis overnight.  Tolerating well.  -Dialysis during inpatient stay per nephrology   #Hypertension Blood pressure soft during dialysis.  Most recent blood pressure per nursing 140/111.  Patient currently on hydralazine 50 mg twice daily as well as carvedilol 6.25 mg twice daily. -Continue to monitor blood pressure closely -Continue with hydralazine and carvedilol  #Type 2 diabetes mellitus With tube feeds, patient is on sliding scale. -Continue to monitor glucose closely -Sign scale insulin  #GERD Patient currently on famotidine.  -Continue famotidine 20 mg daily   #Carotid stenosis -Continue aspirin 81 mg daily -Continue atorvastatin 80 mg daily  Diet: Tube Feeds IVF: None,None VTE: Heparin Code: Full PT/OT recs: Pending  Dispo: Anticipated discharge to Home in 4 days pending clinical  improvement.   Modena Slater DO Internal Medicine Resident PGY-1 [phone] Please contact the on call pager after 5 pm and on weekends at [phone].

## 2023-03-20 NOTE — Procedures (Signed)
Modified Barium Swallow Study  Patient Details  Name: Susan Evans MRN: [id number] Date of Birth: 10-28-1955  Today's Date: 03/20/2023  Modified Barium Swallow completed.  Full report located under Chart Review in the Imaging Section.  History of Present Illness Susan Evans is a 67yo female who presented 6/18 with lip swelling noted at dialysis where they attempted extra volume removal but she did not tolerate this r/t tachycardia and malaise so she was sent to ER where her lip swelling progressed. She also developed SOB and hoarse voice and ENT was consulted. She was ultimately taken to OR and nasally intubated by ENT. ETT 6/18-24. 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   Clinical Impression Pt presents with a profound pharygeal dysphagia with swallow function largely unchaged from 6/25 despite presumed resolution of acute angioedmea, though fullness of pharyngeal structures, particularly the epiglottis and arytenoids, perists.  Pharyngeal impairments include delayed swallow initiation, absent epiglottic inversion, reduced anterior hyoid movement, minimal to no laryngeal elevation, decreased pharyngeal stripping wave, incomplete laryngeal closure, significantly reduced UES opening, and diminished sensation.  There was very minimal pharyngeal clearance of all bolus trials. These deficits resulted in frank aspiration of thin and honey thick liquid during the swallow without immediate cough response.  There was penetration of puree. There was silent aspiration of residuals after the swallow. Cued cough was benefical for clearing a portion of aspiration and was able to greatly decrease or remove pharyngeal residuals.  Effortful swallow was not beneficial for pharyngeal clearance or prevention of penetration/aspiration.    Pt cannot safely consume POs at this time.  Recommend continued alternate means of nutrition, hydration, and medication.   It would be reasonable to allow ice chips and small sips of water for comfort, in moderation, after good oral care, when fully awake/alert, with upright positioning and direct supervision.    Per conversation with treating team, angioedema has resolved and any post extubation dysphagia following removal of ETT 6/24 has likely resolved in this time frame as well.  It is likely that pt has chronic dysphagia following H&N radiation in 2011. If this MBSS is at or near pt's baseline swallow function, recommend discussion around long term AMN and goals of care.  Perhaps further assessment from ENT may help to shed light on ultimate cause of pt's deficits and potential for recovery.  Swallowing prognosis following remote XRT is generally poor. Pt's dysphagia is unlikely to respond well to therapeutic inteventions, with only marginal if any gains anticipated with 6-8 week course of regular, aggressive pharyngeal exercise program. SLP will continue to follow for education and re-evaluation if indicated.   Factors that may increase risk of adverse event in presence of aspiration Rubye Oaks & Clearance Coots 2021): Frail or deconditioned;Presence of tubes (ETT, trach, NG, etc.);Poor general health and/or compromised immunity;Frequent aspiration of large volumes  Swallow Evaluation Recommendations Recommendations: NPO;Alternative means of nutrition - G Tube Medication Administration: Via alternative means Oral care recommendations: Oral care QID (4x/day);Staff/trained caregiver to provide oral care;Oral care before ice chips/water Recommended consults: Consider ENT consultation      Kerrie Pleasure, MA, CCC-SLP Acute Rehabilitation Services Office: [phone] 03/20/2023,11:26 AM

## 2023-03-20 NOTE — Care Management Important Message (Signed)
Important Message  Patient Details  Name: Susan Evans MRN: [id number] Date of Birth: [birth year]   Medicare Important Message Given:  Yes     Sherilyn Banker 03/20/2023, 1:41 PM

## 2023-03-20 NOTE — Progress Notes (Signed)
[facility] ID note  -Seen by vascular noted the graft did not appear overtly infected.  May ultimately benefit from right arm fistulogram due to minimal swelling and pulsatility in the fistula, no role for intervention at this time. - TEE when able given concern for endocarditis.  Would recommend follow-up with cardiology outpatient. - Complete 6 weeks of antibiotic with cefazolin from negative cultures EOT 04/23/2023 - ID will sign off

## 2023-03-20 NOTE — Progress Notes (Signed)
[facility] Progress Note  Subjective: seen in progressive room on 4NP. No c/o's today. Talking w/ family on the phone.   Vitals:   03/20/23 0641 03/20/23 0800 03/20/23 0925 03/20/23 1203  BP: (!) 140/111 95/77 124/74 (!) 111/59  Pulse: 100 98 96 95  Resp: 14 17  (!) 21  Temp:  98.3 F (36.8 C)  99 F (37.2 C)  TempSrc:  Oral  Oral  SpO2: 95% 98%  93%  Weight:      Height:        Exam: GEN  NAD, awake, alert in bed NECK no JVD PULM - CTA ant/ lat CV RRR ABD soft EXT no edema extremities SKIN no rashes ACCESS: R AVG + T/B       Home meds: lipitor, prilosec, renvela 800 mg ac tid, clonidine patch 0.2 weekly, hydralazine 50 bid, verapamil 180 CR 2 tabs at bedtime, prns/ vits/ supps   OP HD: TTS [place]     4h  350/500   36kg  3K/2.5Ca bath  RUA AVF  Hep 2000+ 2000 midrun  - last OP HD 6/18, post wt 38.4  - hectorol 2 mcg IV three times per week  - coming off at dry wt most of last 2 wks   6/26 CXR - no active disease   Assessment/ Plan Angioedema: occurred 12/2021 as well.  Both times this has happened have been in the setting of infection but that's the only common link, not sure that it was the same organism anyway.  CT maxillofacial showed soft tissue swelling/ cellulitis w/o abscess.  No sign of AVF infection on exam.  Pt required nasal intubation by ENT. Pt is getting dexamethasone. C1 esterase inhibitor < 8, but this can occur with sepsis too. Will need OP labs and possibly allergy appt. Pt is already dialyzing on a hypoallergenic dialyzer at OP dialysis for the past year. Will keep this on indefinitely.   Acute resp failure - f/u video laryngoscope done by CCM shows improved swelling of the cords. Pt was extubated successfully w/ no indication of resp distress. Now tx'd out of the ICU. Marland Kitchen  ESRD - TTS HD. Had HD overnight this am, next HD on Saturday.  S agalactiae bacteremia: as above in #1, on cefazolin.  Per pharm and ID she will need IV Ancef 2gm post HD  TTS through 7/31. VVS consulted to assess her AVF, they found no signs of infection. Appreciate assistance.  HTN/ vol: Clonidine patch stopped for low BP's. Current BP's wnl, some on the high side, getting per tube coreg and hydralazine. Euvolemic at dry wt, last CXR negative. Keeping even w/ HD.  Anemia of ESRD: Hgb 9's --> 10s. No esa for now.  Metabolic Bone Disease: Ca/ phos in range. Resume binders/ vits when eating. Cont IV vdra Nutrition - pt is getting TF's w/ Osmolite. K+ levels under control.     Vinson Moselle MD  [facility] 03/20/2023, 3:41 PM  Recent Labs  Lab 03/19/23 0102 03/20/23 0834  HGB 9.0* 10.4*  ALBUMIN 2.3* 2.7*  CALCIUM 8.4* 8.5*  PHOS 3.4 2.5  CREATININE 3.14* 2.39*  K 4.7 4.8    No results for input(s): "IRON", "TIBC", "FERRITIN" in the last 168 hours. Inpatient medications:  aspirin  81 mg Per Tube Daily   atorvastatin  80 mg Per Tube Daily   carvedilol  6.25 mg Per Tube BID WC   Chlorhexidine Gluconate Cloth  6 each Topical Daily   Chlorhexidine Gluconate Cloth  6 each Topical Q0600   doxercalciferol  2 mcg Intravenous Q T,Th,Sa-HD   famotidine  10 mg Per Tube Daily   [START ON 03/21/2023] heparin  2,000 Units Dialysis Once in dialysis   heparin  5,000 Units Subcutaneous Q12H   hydrALAZINE  50 mg Per Tube BID   insulin aspart  0-15 Units Subcutaneous Q4H   loratadine  10 mg Per Tube Daily   multivitamin  1 tablet Per Tube QHS   polyethylene glycol  17 g Per Tube Daily   sevelamer carbonate  800 mg Per Tube TID   sodium chloride HYPERTONIC  4 mL Nebulization Daily   thiamine  100 mg Per Tube Daily    sodium chloride Stopped (03/13/23 1623)   calcium gluconate      ceFAZolin (ANCEF) IV     feeding supplement (OSMOLITE 1.5 CAL) 1,000 mL (03/20/23 0937)   sodium chloride, acetaminophen, heparin, [START ON 03/21/2023] heparin, lidocaine (PF), lidocaine-prilocaine, mouth rinse, mouth rinse, pentafluoroprop-tetrafluoroeth, polyvinyl alcohol

## 2023-03-20 NOTE — Progress Notes (Signed)
Received patient in bed to unit.  Alert and oriented.  Informed consent signed and in chart. yes  TX duration:3:30   Patient tolerated well.  Transported back to the room done at bedside  Alert, without acute distress. No distress Hand-off given to patient's nurse. Cheri Rous RN  Access used: Right upper Arm fistula. Access issues: none  Total UF removed: 1700 Medication(s) given: heparin 2000units Post HD VS: 127/24mp 78,HR 100 temp 98.1 Post HD weight: 36.7kg   [facility]

## 2023-03-20 NOTE — Inpatient Diabetes Management (Signed)
Inpatient Diabetes Program Recommendations  AACE/ADA: New Consensus Statement on Inpatient Glycemic Control (2015)  Target Ranges:  Prepandial:   less than 140 mg/dL      Peak postprandial:   less than 180 mg/dL (1-2 hours)      Critically ill patients:  140 - 180 mg/dL   Lab Results  Component Value Date   GLUCAP 170 (H) 03/20/2023   HGBA1C 9.0 (A) 11/20/2022    Review of Glycemic Control  Latest Reference Range & Units 03/19/23 19:39 03/19/23 23:18 03/20/23 03:19 03/20/23 08:05 03/20/23 11:56  Glucose-Capillary 70 - 99 mg/dL 161 (H) 096 (H) 045 (H) 240 (H) 170 (H)  (H): Data is abnormally high Diabetes history: Type 2 DM Outpatient Diabetes medications: Lantus 30 units BID, Novolog 13 B/10 L/ 10 D Current orders for Inpatient glycemic control: Novolog 0-15 units Q4H Osmolite @ 45 ml/hr  Inpatient Diabetes Program Recommendations:    Consider adding Novolog 3 units Q4H for tube feed coverage (to be stopped or held in the event tube feeds are discontinued)  Thanks, Lujean Rave, MSN, RNC-OB Diabetes Coordinator [phone] (8a-5p)

## 2023-03-20 NOTE — Progress Notes (Signed)
   03/20/23 0500  Vitals  BP Location Left Arm  BP Method Automatic  Patient Position (if appropriate) Lying  During Treatment Monitoring  Intra-Hemodialysis Comments Tx completed  Post Treatment  Dialyzer Clearance Lightly streaked  Duration of HD Treatment -hour(s) 3.5 hour(s)  Hemodialysis Intake (mL) 100 mL  Liters Processed 73.5  Fluid Removed (mL) 1700 mL  Tolerated HD Treatment Yes  Post-Hemodialysis Comments pt states feeling better  AVG/AVF Arterial Site Held (minutes) 12 minutes  AVG/AVF Venous Site Held (minutes) 12 minutes   Pt tolerated treatment. Report given to Cheri Rous RN.

## 2023-03-21 DIAGNOSIS — T783XXA Angioneurotic edema, initial encounter: Secondary | ICD-10-CM | POA: Diagnosis not present

## 2023-03-21 DIAGNOSIS — B951 Streptococcus, group B, as the cause of diseases classified elsewhere: Secondary | ICD-10-CM | POA: Diagnosis not present

## 2023-03-21 DIAGNOSIS — R7881 Bacteremia: Secondary | ICD-10-CM | POA: Diagnosis not present

## 2023-03-21 LAB — GLUCOSE, CAPILLARY
Glucose-Capillary: 160 mg/dL — ABNORMAL HIGH (ref 70–99)
Glucose-Capillary: 162 mg/dL — ABNORMAL HIGH (ref 70–99)
Glucose-Capillary: 165 mg/dL — ABNORMAL HIGH (ref 70–99)
Glucose-Capillary: 189 mg/dL — ABNORMAL HIGH (ref 70–99)
Glucose-Capillary: 189 mg/dL — ABNORMAL HIGH (ref 70–99)
Glucose-Capillary: 208 mg/dL — ABNORMAL HIGH (ref 70–99)
Glucose-Capillary: 284 mg/dL — ABNORMAL HIGH (ref 70–99)

## 2023-03-21 LAB — RENAL FUNCTION PANEL
Albumin: 2.5 g/dL — ABNORMAL LOW (ref 3.5–5.0)
Anion gap: 10 (ref 5–15)
BUN: 45 mg/dL — ABNORMAL HIGH (ref 8–23)
CO2: 30 mmol/L (ref 22–32)
Calcium: 8.6 mg/dL — ABNORMAL LOW (ref 8.9–10.3)
Chloride: 92 mmol/L — ABNORMAL LOW (ref 98–111)
Creatinine, Ser: 3.43 mg/dL — ABNORMAL HIGH (ref 0.44–1.00)
GFR, Estimated: 14 mL/min — ABNORMAL LOW (ref >60)
Glucose, Bld: 272 mg/dL — ABNORMAL HIGH (ref 70–99)
Phosphorus: 2.8 mg/dL (ref 2.5–4.6)
Potassium: 5.8 mmol/L — ABNORMAL HIGH (ref 3.5–5.1)
Sodium: 132 mmol/L — ABNORMAL LOW (ref 135–145)

## 2023-03-21 LAB — CBC
HCT: 30.5 % — ABNORMAL LOW (ref 36.0–46.0)
Hemoglobin: 9.2 g/dL — ABNORMAL LOW (ref 12.0–15.0)
MCH: 23.6 pg — ABNORMAL LOW (ref 26.0–34.0)
MCHC: 30.2 g/dL (ref 30.0–36.0)
MCV: 78.2 fL — ABNORMAL LOW (ref 80.0–100.0)
Platelets: 230 10*3/uL (ref 150–400)
RBC: 3.9 MIL/uL (ref 3.87–5.11)
RDW: 14.7 % (ref 11.5–15.5)
WBC: 9.2 10*3/uL (ref 4.0–10.5)
nRBC: 0 % (ref 0.0–0.2)

## 2023-03-21 LAB — BASIC METABOLIC PANEL
Anion gap: 11 (ref 5–15)
BUN: 54 mg/dL — ABNORMAL HIGH (ref 8–23)
CO2: 31 mmol/L (ref 22–32)
Calcium: 8.9 mg/dL (ref 8.9–10.3)
Chloride: 91 mmol/L — ABNORMAL LOW (ref 98–111)
Creatinine, Ser: 3.68 mg/dL — ABNORMAL HIGH (ref 0.44–1.00)
GFR, Estimated: 13 mL/min — ABNORMAL LOW (ref >60)
Glucose, Bld: 174 mg/dL — ABNORMAL HIGH (ref 70–99)
Potassium: 5.2 mmol/L — ABNORMAL HIGH (ref 3.5–5.1)
Sodium: 133 mmol/L — ABNORMAL LOW (ref 135–145)

## 2023-03-21 MED ORDER — INSULIN GLARGINE-YFGN 100 UNIT/ML ~~LOC~~ SOLN
5.0000 [IU] | Freq: Every day | SUBCUTANEOUS | Status: DC
  Administered 2023-03-21 – 2023-03-25 (×5): 5 [IU] via SUBCUTANEOUS
  Filled 2023-03-21 (×6): qty 0.05

## 2023-03-21 MED ORDER — SODIUM ZIRCONIUM CYCLOSILICATE 10 G PO PACK
10.0000 g | PACK | Freq: Three times a day (TID) | ORAL | Status: AC
  Administered 2023-03-21 (×2): 10 g via ORAL
  Filled 2023-03-21 (×2): qty 1

## 2023-03-21 MED ORDER — NEPRO/CARBSTEADY PO LIQD
1000.0000 mL | ORAL | Status: DC
  Administered 2023-03-21 – 2023-03-29 (×6): 1000 mL
  Filled 2023-03-21 (×9): qty 1000

## 2023-03-21 MED ORDER — SODIUM ZIRCONIUM CYCLOSILICATE 10 G PO PACK
10.0000 g | PACK | Freq: Once | ORAL | Status: AC
  Administered 2023-03-21: 10 g
  Filled 2023-03-21: qty 1

## 2023-03-21 MED ORDER — SODIUM ZIRCONIUM CYCLOSILICATE 10 G PO PACK: 10.0000 g | PACK | Freq: Once | ORAL | Status: DC

## 2023-03-21 MED ORDER — SODIUM ZIRCONIUM CYCLOSILICATE 5 G PO PACK: 5.0000 g | PACK | Freq: Once | ORAL | Status: DC

## 2023-03-21 NOTE — Progress Notes (Signed)
[facility] Progress Note   Subjective: Seen in room cortrak feeding tube in place. Voice is raspy. Denies SOB. Sister at bedside. HD tomorrow on schedule.   Objective Vitals:   03/21/23 0000 03/21/23 0024 03/21/23 0300 03/21/23 0835  BP: (!) 159/71  (!) 178/84   Pulse: 96 98 99 96  Resp: (!) 32 (!) 22 20 12   Temp:   (!) 97.5 F (36.4 C)   TempSrc:   Oral   SpO2: 91% 90% 97% 98%  Weight:      Height:       Physical Exam General: Thin, Chronically ill appearing female in NAD Heart: SR on monitor. No M/R/G Lungs: CTAB A/P Abdomen: NABS flat, cortak feeding tube in place Extremities:no LE edema Dialysis Access: R AVG+T/B    Additional Objective Labs: Basic Metabolic Panel: Recent Labs  Lab 03/19/23 0102 03/20/23 0834 03/20/23 1550 03/21/23 0429  NA 136 132*  --  132*  K 4.7 4.8 5.2* 5.8*  CL 96* 92*  --  92*  CO2 30 28  --  30  GLUCOSE 152* 248*  --  272*  BUN 33* 26*  --  45*  CREATININE 3.14* 2.39*  --  3.43*  CALCIUM 8.4* 8.5*  --  8.6*  PHOS 3.4 2.5  --  2.8   Liver Function Tests: Recent Labs  Lab 03/19/23 0102 03/20/23 0834 03/21/23 0429  ALBUMIN 2.3* 2.7* 2.5*   No results for input(s): "LIPASE", "AMYLASE" in the last 168 hours. CBC: Recent Labs  Lab 03/16/23 0122 03/18/23 0431 03/19/23 0102 03/20/23 0834 03/21/23 0429  WBC 9.8 13.3* 9.8 10.9* 9.2  HGB 10.2* 9.7* 9.0* 10.4* 9.2*  HCT 31.0* 30.3* 28.7* 34.2* 30.5*  MCV 74.3* 74.3* 78.2* 79.9* 78.2*  PLT 174 183 188 223 230   Blood Culture    Component Value Date/Time   SDES BLOOD LEFT HAND 03/12/2023 1023   SPECREQUEST  03/12/2023 1023    BOTTLES DRAWN AEROBIC ONLY Blood Culture results may not be optimal due to an inadequate volume of blood received in culture bottles   CULT  03/12/2023 1023    NO GROWTH 5 DAYS Performed at [facility], [address], [place], [place] [address]    REPTSTATUS 03/17/2023 FINAL 03/12/2023 1023    Cardiac Enzymes: No results  for input(s): "CKTOTAL", "CKMB", "CKMBINDEX", "TROPONINI" in the last 168 hours. CBG: Recent Labs  Lab 03/20/23 1617 03/20/23 2024 03/21/23 0013 03/21/23 0409 03/21/23 0757  GLUCAP 206* 166* 189* 208* 284*   Iron Studies: No results for input(s): "IRON", "TIBC", "TRANSFERRIN", "FERRITIN" in the last 72 hours. @lablastinr3 @ Studies/Results: DG Swallowing Func-Speech Pathology  Result Date: 03/20/2023 Table formatting from the original result was not included. Modified Barium Swallow Study Patient Details Name: Susan Evans MRN: [id number] Date of Birth: 01-Oct-1955 Today's Date: 03/20/2023 HPI/PMH: HPI: Susan Evans is a 67yo female who presented 6/18 with lip swelling noted at dialysis where they attempted extra volume removal but she did not tolerate this r/t tachycardia and malaise so she was sent to ER where her lip swelling progressed. She also developed SOB and hoarse voice and ENT was consulted. She was ultimately taken to OR and nasally intubated by ENT. ETT 6/18-24. 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 Clinical Impression: Clinical Impression: Pt presents with a profound pharygeal dysphagia with swallow function largely unchaged from 6/25 despite presumed resolution of acute angioedmea, though fullness of pharyngeal  structures, particularly the epiglottis and arytenoids, perists.  Pharyngeal impairments include delayed swallow initiation, absent epiglottic inversion, reduced anterior hyoid movement, minimal to no laryngeal elevation, decreased pharyngeal stripping wave, incomplete laryngeal closure, significantly reduced UES opening, and diminished sensation.  There was very minimal pharyngeal clearance of all bolus trials. These deficits resulted in frank aspiration of thin and honey thick liquid during the swallow without immediate cough response.  There was penetration of puree. There was silent aspiration of residuals after the  swallow. Cued cough was benefical for clearing a portion of aspiration and was able to greatly decrease or remove pharyngeal residuals.  Effortful swallow was not beneficial for pharyngeal clearance or prevention of penetration/aspiration.  Pt cannot safely consume POs at this time.  Recommend continued alternate means of nutrition, hydration, and medication.  It would be reasonable to allow ice chips and small sips of water for comfort, in moderation, after good oral care, when fully awake/alert, with upright positioning and direct supervision.  Per conversation with treating team, angioedema has resolved and any post extubation dysphagia following removal of ETT 6/24 has likely resolved in this time frame as well.  It is likely that pt has chronic dysphagia following H&N radiation in 2011. If this MBSS is at or near pt's baseline swallow function, recommend discussion around long term AMN and goals of care.  Perhaps further assessment from ENT may help to shed light on ultimate cause of pt's deficits and potential for recovery.  Swallowing prognosis following remote XRT is generally poor. Pt's dysphagia is unlikely to respond well to therapeutic inteventions, with only marginal if any gains anticipated with 6-8 week course of regular, aggressive pharyngeal exercise program. SLP will continue to follow for education and re-evaluation if indicated. Factors that may increase risk of adverse event in presence of aspiration Rubye Oaks & Clearance Coots 2021): Factors that may increase risk of adverse event in presence of aspiration Rubye Oaks & Clearance Coots 2021): Frail or deconditioned; Presence of tubes (ETT, trach, NG, etc.); Poor general health and/or compromised immunity; Frequent aspiration of large volumes Recommendations/Plan: Swallowing Evaluation Recommendations Swallowing Evaluation Recommendations Recommendations: NPO; Alternative means of nutrition - G Tube Medication Administration: Via alternative means Oral care  recommendations: Oral care QID (4x/day); Staff/trained caregiver to provide oral care; Oral care before ice chips/water Recommended consults: Consider ENT consultation Treatment Plan Treatment Plan Treatment recommendations: Therapy as outlined in treatment plan below Follow-up recommendations: Follow physicians's recommendations for discharge plan and follow up therapies Functional status assessment: Patient has had a recent decline in their functional status and demonstrates the ability to make significant improvements in function in a reasonable and predictable amount of time. Treatment frequency: Min 2x/week Treatment duration: 2 weeks Interventions: Patient/family education; Aspiration precaution training Recommendations Recommendations for follow up therapy are one component of a multi-disciplinary discharge planning process, led by the attending physician.  Recommendations may be updated based on patient status, additional functional criteria and insurance authorization. Assessment: Orofacial Exam: Orofacial Exam Oral Cavity: Oral Hygiene: WFL Oral Cavity - Dentition: Edentulous Orofacial Anatomy: WFL Oral Motor/Sensory Function: -- (see BSE from 6/25) Anatomy: Anatomy: -- (Edema) Boluses Administered: Boluses Administered Boluses Administered: Thin liquids (Level 0); Moderately thick liquids (Level 3, honey thick); Puree  Oral Impairment Domain: Oral Impairment Domain Lip Closure: No labial escape Tongue control during bolus hold: Posterior escape of less than half of bolus Bolus preparation/mastication: Timely and efficient chewing and mashing Bolus transport/lingual motion: Brisk tongue motion Oral residue: Trace residue lining oral structures Location of oral  residue : Tongue Initiation of pharyngeal swallow : Valleculae  Pharyngeal Impairment Domain: Pharyngeal Impairment Domain Soft palate elevation: No bolus between soft palate (SP)/pharyngeal wall (PW) Laryngeal elevation: Minimal superior movement of  thyroid cartilage with minimal approximation of arytenoids to epiglottic petiole Anterior hyoid excursion: Partial anterior movement Epiglottic movement: No inversion Laryngeal vestibule closure: Incomplete, narrow column air/contrast in laryngeal vestibule Pharyngeal stripping wave : Absent Pharyngeal contraction (A/P view only): N/A Pharyngoesophageal segment opening: Minimal distention/minimal duration, marked obstruction of flow Tongue base retraction: Wide column of contrast or air between tongue base and PPW Pharyngeal residue: Majority of contrast within or on pharyngeal structures Location of pharyngeal residue: Valleculae; Aryepiglottic folds; Pyriform sinuses  Esophageal Impairment Domain: Esophageal Impairment Domain Esophageal clearance upright position: -- (Not assessed) Pill: Pill Consistency administered: -- (Not assessed) Penetration/Aspiration Scale Score: Penetration/Aspiration Scale Score 2.  Material enters airway, remains ABOVE vocal cords then ejected out: Puree 8.  Material enters airway, passes BELOW cords without attempt by patient to eject out (silent aspiration) : Thin liquids (Level 0); Moderately thick liquids (Level 3, honey thick) Compensatory Strategies: Compensatory Strategies Compensatory strategies: Yes Effortful swallow: Ineffective Oral bolus hold: Ineffective Other(comment): -- (Cued cough; partially effective)   General Information: Caregiver present: No  Diet Prior to this Study: NPO; Cortrak/Small bore NG tube   No data recorded  Respiratory Status: WFL   Supplemental O2: Nasal cannula   History of Recent Intubation: Yes  Behavior/Cognition: Alert; Cooperative; Pleasant mood No data recorded Baseline vocal quality/speech: Dysphonic No data recorded No data recorded Exam Limitations: No limitations Goal Planning: Prognosis for improved oropharyngeal function: Guarded Barriers to Reach Goals: Severity of deficits; Time post onset No data recorded Patient/Family Stated Goal: not  stated Consulted and agree with results and recommendations: Patient; Physician Pain: Pain Assessment Pain Assessment: No/denies pain Faces Pain Scale: 0 Facial Expression: 0 Body Movements: 0 Muscle Tension: 0 Compliance with ventilator (intubated pts.): N/A Vocalization (extubated pts.): 0 CPOT Total: 0 Pain Intervention(s): Monitored during session End of Session: Start Time:No data recorded Stop Time: No data recorded Time Calculation:No data recorded Charges: No data recorded SLP visit diagnosis: SLP Visit Diagnosis: Dysphagia, pharyngoesophageal phase (R13.14) Past Medical History: Past Medical History: Diagnosis Date  AKI (acute kidney injury) (HCC) 08/12/2019  Anaphylactic shock, unspecified, initial encounter 05/09/2020  Chronic kidney disease   Dialysis T/Th/Sat- [address]  CKD stage G1/A3, GFR > 90 and albumin creatinine ratio >300 mg/g 07/08/2016  Nephrotic range proteinuria  Diabetes mellitus   type 2   Dyspnea   "when I have too much fluid."  Ganglion cyst   right wrist  GERD (gastroesophageal reflux disease)   History of radiation therapy 02/27/10 -04/13/10  left base of tongue, bilat neck  Hyperlipidemia   Hypertension   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/21/2021  Iron deficiency anemia   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  Menopause   Thyroid disease   Hyperthyroidism  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/08/2020  Tobacco abuse   PT HAS QUIT Past Surgical History: Past Surgical History: Procedure Laterality Date  AV FISTULA PLACEMENT Left 06/02/2020  Procedure: INSERTION OF ARTERIOVENOUS (AV) GORE-TEX GRAFT ARM LEFT;  Surgeon: Cephus Shelling, MD;  Location: [facility] OR;  Service: Vascular;  Laterality: Left;  AV FISTULA PLACEMENT Right 12/27/2020  Procedure: RIGHT ARM CEPHALIC ARTERIOVENOUS FISTULA CREATION;  Surgeon: Maeola Harman, MD;  Location[facility] OR;  Service: Vascular;  Laterality: Right;  AVGG REMOVAL Left 12/21/2021  Procedure:  EXCISION OF LEFT ARM ARTERIOVENOUS GORETEX GRAFT (AVGG);  Surgeon: Maeola Harman, MD;  Location: [facility] OR;  Service: Vascular;  Laterality: Left;  IR FLUORO GUIDE CV LINE LEFT  05/15/2020  IR REMOVAL TUN CV CATH W/O FL  05/15/2020  IR REMOVAL TUN CV CATH W/O FL  06/15/2021  IR US GUIDE VASC ACCESS LEFT  05/15/2020  NECK SURGERY    REDUCTION MAMMAPLASTY    TRACHEOSTOMY TUBE PLACEMENT N/A 03/11/2023  Procedure: AWAKE INTUBATION;  Surgeon: Christia Reading, MD;  Location: [facility] OR;  Service: ENT;  Laterality: N/A; Kerrie Pleasure, MA, CCC-SLP Acute Rehabilitation Services Office: [phone] 03/20/2023, 11:29 AM  Medications:  sodium chloride Stopped (03/13/23 1623)    ceFAZolin (ANCEF) IV 2 g (03/20/23 1842)   feeding supplement (OSMOLITE 1.5 CAL) 1,000 mL (03/21/23 0453)    aspirin  81 mg Per Tube Daily   atorvastatin  80 mg Per Tube Daily   carvedilol  6.25 mg Per Tube BID WC   Chlorhexidine Gluconate Cloth  6 each Topical Daily   Chlorhexidine Gluconate Cloth  6 each Topical Q0600   doxercalciferol  2 mcg Intravenous Q T,Th,Sa-HD   famotidine  10 mg Per Tube Daily   heparin  2,000 Units Dialysis Once in dialysis   heparin  5,000 Units Subcutaneous Q12H   hydrALAZINE  50 mg Per Tube BID   insulin aspart  0-15 Units Subcutaneous Q4H   loratadine  10 mg Per Tube Daily   multivitamin  1 tablet Per Tube QHS   polyethylene glycol  17 g Per Tube Daily   sevelamer carbonate  800 mg Per Tube TID   thiamine  100 mg Per Tube Daily     [facility]     4h  350/500   36kg  3K/2.5Ca bath  RUA AVF  Hep 2000+ 2000 midrun  - last OP HD 6/18, post wt 38.4  - hectorol 2 mcg IV three times per week  - coming off at dry wt most of last 2 wks    6/26 CXR - no active disease   Assessment/ Plan Angioedema: occurred 12/2021 as well.  Both times this has happened have been in the setting of infection but that's the only common link, not sure that it was the same organism anyway.  CT maxillofacial showed soft  tissue swelling/ cellulitis w/o abscess.  No sign of AVF infection on exam.  Pt required nasal intubation by ENT. Pt is getting dexamethasone. C1 esterase inhibitor < 8, but this can occur with sepsis too. Will need OP labs and possibly allergy appt. Pt is already dialyzing on a hypoallergenic dialyzer at OP dialysis for the past year. Will keep this on indefinitely.   Acute resp failure - f/u video laryngoscope done by CCM showed improved swelling of the cords. Pt was extubated successfully. Was then moved out of ICU to progressive bed.  Hyperkalemia: K+ 5.8 today. Stop osmolite TF. Start nepro. Lokelma 10 grams per FT now and repeat again in 8 hours. Check labs in AM. Use 2.0 K bath with HD tomorrow.  ESRD - TTS HD. Had HD overnight this am, next HD on 03/22/2023 S agalactiae bacteremia: as above in #1, on cefazolin.  Per pharm and ID she will need IV Ancef 2gm post HD TTS through 7/31. VVS consulted to assess her AVF, they found no signs of infection. Appreciate assistance.  HTN/ vol: Clonidine patch stopped for low BP's. Current BP's wnl, some on the high side, getting  per tube coreg and hydralazine. Euvolemic at dry wt, last CXR negative. Keeping even w/ HD.  Anemia of ESRD: Hgb 9's --> 10s. No esa for now.  Metabolic Bone Disease: Ca/ phos in range. Resume binders/ vits when eating. Cont IV vdra Nutrition - pt is getting TF's w/ Osmolite. K+ 5.8 today. Give Lokelma X 1 dose and repeat in 8 hours. Change TF to Nepro.  repeat labs in AM  [place] H. Brown NP-C 03/21/2023, 11:30 AM  [facility] [phone]  Pt seen, examined and agree w A/P as above.  Vinson Moselle MD  [facility] 03/21/2023, 4:42 PM

## 2023-03-21 NOTE — Progress Notes (Addendum)
HD#10 Subjective:   Summary: This is a 67 year old female with a past medical history of hypertension, type 2 diabetes, ESRD on dialysis TTS who presented with concerns of difficulty breathing.  Found to have angioedema that required intubation.  Patient was followed in the ICU.  Blood cultures resulted positive for group B strep.  Patient since been extubated and transferred to the floor.  Patient admitted for further evaluation of acute hypoxic respiratory failure as well as group B strep bacteremia.  Overnight Events: Overnight, patient had peaked T waves on telemetry, obtain EKG which showed peaked T waves.  Family at bedside this morning. She states breathing ok and no new concerns. Denies any worsening swelling of her neck. Able to ambulate and states no concerns. Passing gas. Prior to admission, eating a puree diet and did not have any choking or trouble swallowing.  Patient does endorse that before arrival to the hospital, she was on a soft/pured diet and was tolerating this well.  Discussed plan with patient and answered all questions.   Objective:  Vital signs in last 24 hours: Vitals:   03/21/23 0024 03/21/23 0300 03/21/23 0835 03/21/23 1143  BP:  (!) 178/84  (!) 167/64  Pulse: 98 99 96 95  Resp: (!) 22 20 12  (!) 23  Temp:  (!) 97.5 F (36.4 C)  98.6 F (37 C)  TempSrc:  Oral  Oral  SpO2: 90% 97% 98% 100%  Weight:      Height:       Supplemental O2: Room Air SpO2: 98 %   Physical Exam:  Constitutional: Chronically ill-appearing, resting in recliner, no acute distress. Cortrack in place HENT: normocephalic atraumatic, able to visualize palate and uvula Cardiovascular: regular rate and rhythm, no m/r/g Pulmonary/Chest: Clear to auscultation bilaterally  Extremities: No pitting edema appreciated  Filed Weights   03/16/23 0500 03/17/23 0500 03/19/23 0500  Weight: 38.4 kg 38.6 kg 38.5 kg     Intake/Output Summary (Last 24 hours) at 03/21/2023 1513 Last data filed  at 03/21/2023 0700 Gross per 24 hour  Intake 1820 ml  Output --  Net 1820 ml   Net IO Since Admission: 3,648.91 mL [03/21/23 1513]  Pertinent Labs:    Latest Ref Rng & Units 03/21/2023    4:29 AM 03/20/2023    8:34 AM 03/19/2023    1:02 AM  CBC  WBC 4.0 - 10.5 K/uL 9.2  10.9  9.8   Hemoglobin 12.0 - 15.0 g/dL 9.2  16.1  9.0   Hematocrit 36.0 - 46.0 % 30.5  34.2  28.7   Platelets 150 - 400 K/uL 230  223  188        Latest Ref Rng & Units 03/21/2023   12:33 PM 03/21/2023    4:29 AM 03/20/2023    3:50 PM  CMP  Glucose 70 - 99 mg/dL 096  045    BUN 8 - 23 mg/dL 54  45    Creatinine 4.09 - 1.00 mg/dL 8.11  9.14    Sodium 782 - 145 mmol/L 133  132    Potassium 3.5 - 5.1 mmol/L 5.2  5.8  5.2   Chloride 98 - 111 mmol/L 91  92    CO2 22 - 32 mmol/L 31  30    Calcium 8.9 - 10.3 mg/dL 8.9  8.6      Imaging: No results found.  Assessment/Plan:   Principal Problem:   Bacteremia due to group B Streptococcus Active Problems:   Type 2  diabetes mellitus with complication (HCC)   Essential hypertension   ESRD (end stage renal disease) (HCC)   Acute hypoxic respiratory failure (HCC)   Microcytic anemia   Malignant neoplasm of pharynx, unspecified (HCC)   Protein-calorie malnutrition, severe   Angio-edema   Patient Summary: Susan Evans is a 67 y.o. female with a past medical history of hypertension, type 2 diabetes, ESRD on dialysis TTS who presented with concerns of difficulty breathing.  Found to have angioedema that required intubation.  Patient was followed in the ICU.  Blood cultures resulted positive for group B strep.  Patient since been extubated and transferred to the floor.  Patient admitted for further evaluation of acute hypoxic respiratory failure as well as group B strep bacteremia.  #Group B strep bacteremia Patient has no concerns this morning.  Afebrile.  No white count.  Will continue IV cefazolin. -Continue IV cefazolin -End date of antibiotics  04/23/2023 -Monitor fever curve  #Angioedema #Epiglottis edema #History of radiation therapy to neck #Dysphagia Patient did have swallowing study yesterday, which did not go well.  Spoke with ENT, who states that there is no further workup for this as her airway is not compromised.  Patient was eating a soft/pured diet prior to admission to the hospital and do anticipate she can go back to soft/pured diet after some time.  This will take weeks.  Did bring up the idea of LTACH versus inpatient rehab to the patient today, and she was receptive to this.  Will reach out to case worker today and see if we can get this process started.  Currently not concerned about airway compromise.  Patient is satting well on room air.   -C4 complement pending -Speech following -Monitor for worsening respiratory distress -LTACH versus inpatient rehab -Continue tube feeds for now -N.p.o.  #Acute hypoxic respiratory failure 2/2 angioedema, resolved Patient weaned down to room air -No acute concerns at this time  #ESRD on HD TTS #Hyperkalemia Patient did have elevated potassium at 5.8.  Previous EKG showed peaked T waves.  Overnight did give calcium.  K down to 5.2.  Patient will be getting HD tomorrow.   -Dialysis during inpatient stay per nephrology   #Hypertension Blood pressure elevated.  Likely will decrease with HD tomorrow.  Will continue with current medications. Patient currently on hydralazine 50 mg twice daily as well as carvedilol 6.25 mg twice daily. -Continue to monitor blood pressure closely -Continue with hydralazine and carvedilol  #Type 2 diabetes mellitus With tube feeds, patient is on sliding scale.  Home dosing includes Lantus 30 units twice daily as well as NovoLog 10 units with meals -Continue to monitor glucose closely -Sliding scale insulin -Semglee 5 units nightly  #GERD Patient currently on famotidine.  -Continue famotidine 20 mg daily   #Carotid stenosis -Continue  aspirin 81 mg daily -Continue atorvastatin 80 mg daily  Diet: Tube Feeds IVF: None,None VTE: Heparin Code: Full PT/OT recs: Pending Family Update: Attempted to call brother, Bishop, with no answer  Dispo: Anticipated discharge to [facility] vs inpatient rehab in 3 days pending clinical improvement.   Modena Slater DO Internal Medicine Resident PGY-1 [phone] Please contact the on call pager after 5 pm and on weekends at [phone].

## 2023-03-21 NOTE — Progress Notes (Signed)
HD#10 SUBJECTIVE:  Patient Summary: Susan Evans is a 67 y.o. with a pertinent PMH of hypertension, type 2 diabetes, ESRD on dialysis TTS , who presented with angioedema requiring intubation now stable on the medical floor with MSSA bacteremia and ongoing dysphagia.   Overnight Events: NAEON.  Interim History: Patient seen in HD. She has no acute complaints at this time. Is discussing with her siblings over the weekend if she would rather go to [facility] or inpatient rehab for further speech therapy. She is still okay with the plan to proceed with one of these options.  OBJECTIVE:  Vital Signs: Vitals:   03/21/23 0300 03/21/23 0835 03/21/23 1143 03/21/23 1501  BP: (!) 178/84  (!) 167/64 (!) 117/52  Pulse: 99 96 95 97  Resp: 20 12 (!) 23 (!) 22  Temp: (!) 97.5 F (36.4 C)  98.6 F (37 C)   TempSrc: Oral  Oral Oral  SpO2: 97% 98% 100% 97%  Weight:      Height:       Supplemental O2: Room Air SpO2: 97 % O2 Flow Rate (L/min): 4 L/min FiO2 (%): (S) 55 %  Filed Weights   03/16/23 0500 03/17/23 0500 03/19/23 0500  Weight: 38.4 kg 38.6 kg 38.5 kg     Intake/Output Summary (Last 24 hours) at 03/21/2023 1528 Last data filed at 03/21/2023 0700 Gross per 24 hour  Intake 1820 ml  Output --  Net 1820 ml   Net IO Since Admission: 3,648.91 mL [03/21/23 1528]  Physical Exam: Constitutional:Resting comfortably in HD bed. In no acute distress. Cardio:Regular rate and rhythm. No murmurs, rubs, or gallops. Pulm:Clear to auscultation bilaterally. Normal work of breathing on room air. VHQ:IONGEXBM for extremity edema. Skin:Warm and dry. Neuro:Alert and oriented x3. No focal deficit noted. Psych:Pleasant mood and affect.  Patient Lines/Drains/Airways Status     Active Line/Drains/Airways     Name Placement date Placement time Site Days   Peripheral IV 03/20/23 22 G 1" Left;Anterior Forearm 03/20/23  1632  Forearm  1   Fistula / Graft Right Upper arm Arteriovenous fistula 03/11/23   1900  Upper arm  10   Incision (Closed) 12/21/21 Arm Left;Upper 12/21/21  1554  -- 455   Incision (Closed) 03/11/23 Lip Other (Comment) 03/11/23  1434  -- 10   Small Bore Feeding Tube Left nare Marking at nare/corner of mouth 63 cm 03/12/23  1427  Left nare  9             ASSESSMENT/PLAN:  Assessment: Principal Problem:   Bacteremia due to group B Streptococcus Active Problems:   Type 2 diabetes mellitus with complication (HCC)   Essential hypertension   ESRD (end stage renal disease) (HCC)   Acute hypoxic respiratory failure (HCC)   Microcytic anemia   Malignant neoplasm of pharynx, unspecified (HCC)   Protein-calorie malnutrition, severe   Angio-edema   Plan:   #Group B strep bacteremia Afebrile, HDS, mild leukocytosis with slight increase from 9.2, though on review of daily labs she has every-other-day resolution of then recurrence of mild leukocytosis. Continues on IV cefazolin, end-date 04/23/2023.  -Continue IV cefazolin, last day 04/23/2023 -Trend CBC, fever curve   #Angioedema, resolved #Epiglottis edema #Hx of radiation therapy to neck #Dysphagia Unable to safely swallow. Lengthy discussion with patient and family 06/28 regarding continued CorTrak for temporary nutrition while going to either LTAC or CIR for ongoing speech/swallowing therapy. She is not having any respiratory distress fortunately and is saturating well on room air, uses  a suction to manage secretions. C1 esterase inhibitor low <8 which can be seen in acquired/inherited angioedema though noted earlier in admission that this can be seen in sepsis with recommendation for OP f/u labs and possible allergist appointment. C4 complement mildly elevated to 41 though this can be seen in setting of active inflammation. SW team aware of situation and engaged in ongoing discussions with family regarding options. -SLP following -F/u ongoing conversations re: dispo planning with SW team -Continue CorTrak, TF for  now with NPO status -If respiratory compromise please page PCCM +/- ENT  #Anemia Appears chronic in nature, with Hgb fluctuating this admission. No signs of active bleed at this time. Hgb 7.9, HCT 25.7. I am unable to see any recent iron studies though these may have been done with HD. Regardless, our nephrology team has ordered iron studies. If depleted, I anticipate iron repletion and possibly initiation of EPO therapy. -F/u iron studies   #ESRD on HD TTS HD today, renal function fluctuating as expected.   -Trend BMP, small doses of lokelma as needed -HD per nephrology   #Hypertension Current regimen is hydralazine 50 mg BID and carvedilol 6.25 mg BID.  -Continue hydralazine 50 mg BID and carvedilol 6.25 mg BID   #Type 2 diabetes mellitus CBG range 130-284 over previous 24h with improved values noted after starting semglee overnight 05/28. Semglee 5 units daily at bedtime started 06/28, remains on SSI. Required 27 units total yesterday including semglee. -CBG per unit protocol -Semglee 5 units daily at bedtime, SSI. Plan to increase semglee tomorrow if requires increased SSI doses over the course of today.   #GERD -Continue famotidine 20 mg daily    #Carotid stenosis -Continue aspirin 81 mg daily -Continue atorvastatin 80 mg daily  Best Practice: Diet: NPO, CorTrak w/ TF IVF: None VTE: heparin injection 5,000 Units Start: 03/16/23 2200 SCDs Start: 03/11/23 1504 Code: Full AB: Cefazolin DISPO: Anticipated discharge pending  dispo planning .  Signature: Champ Mungo, D.O.  Internal Medicine Resident, PGY-2 [facility]  Pager: [phone]   Please contact the on call pager after 5 pm and on weekends at [phone].

## 2023-03-21 NOTE — Hospital Course (Addendum)
Susan Evans is a 67 y.o. female with a past medical history of hypertension, type 2 diabetes, ESRD on dialysis TTS who presented with concerns of difficulty breathing.  Found to have angioedema that required intubation. Blood cultures resulted positive for group B strep.  Patient was extubated and admitted to the internal medicine service further evaluation of acute hypoxic respiratory failure secondary to angioedema and group B strep bacteremia.  Acute hypoxic respiratory failure 2/2 angioedema History of radiation therapy to neck Dysphagia Patient presented to the emergency department with concerns of shortness of breath and swelling of her throat. She was found to have angioedema, from an unclear etiology.  She required nasal tracheal intubation by ENT in the operating room and was initially admitted to the ICU.  Patient progressed well, and was extubated and transferred to the floor.  Patient was weaned down back to room air, as swelling improved.  Patient was able to be discharged on room air.  Patient did not require reintubation during hospitalization. 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 Patient did have low C1 esterase inhibitor labs, raising suspicion for hereditary angioedema; C4 complement level was 41.  Patient was extubated and had persistent dysphagia, requiring PEG tube placement on 7/5. Patient will be discharged on tube feedings, with plans to remove tube feeds once patient improves.  Group B strep bacteremia Patient was found to have group B strep bacteremia during hospitalization.  Unclear source of bacteremia.  It was thought to be from her AV fistula, but vascular surgery thought this was unlikely.  Patient was followed by infectious disease during hospitalization who recommended IV cefazolin. Patient to continue IV cefazolin until 04/23/2023.   Acute on chronic normocytic anemia Patient has baseline anemic in the  setting of ESRD, with a baseline hemoglobin around 10-11.  Over the course of the admission, her hemoglobin dropped as low as 5.3.  She required 4 units of blood over the course of her admission.  No apparent source of bleeding was noted on exam.  GI was consulted for concerns of occult GI bleed and decided to proceed with an EGD.  Screening colonoscopy is due, which can be done outpatient.  She has been receiving 40 mg Protonix twice daily.  ESRD on HD TTS Patient is an ESRD patient getting hemo-dialysis Tuesday, Thursday, Saturdays; she has continued this dialysis schedule throughout her hospital course.  Nephrology has been following.  Hypertension During hospitalization, patient remained hemodynamically stable.  Home regimen include hydralazine and Coreg.  Held during hospitalization but restarted upon discharge.   Type 2 diabetes mellitus Patient has a history of type 2 diabetes mellitus.  Home insulin regimen included 30 units Lantus twice daily as well as NovoLog 10 units 3 times daily with feeds.  During hospital stay, as patient was on tube feeds, held home regimen.  Started on 25 units insulin glargine, sliding scale short acting insulin during admission.   GERD Patient continued home famotidine 20 mg daily.   Mild Carotid stenosis On admission, patient was found to have carotid stenosis (1-39%).  Patient was started on aspirin.  Patient also continued on home atorvastatin 80 mg daily.  Patient discharged on same.   Follow-up: A.  Angioedema:  B.  Group B strep bacteremia:  C.  Type 2 diabetes mellitus: Patient discharged on ***   @PATIEN  It was a pleasure taking care of you at [facility]. You were admitted for *** and  treated for ***. We are discharging you home now that you are doing better. Please follow the following instructions.  1) 2) 3)  Take care,  Dr. Marland Kitchen

## 2023-03-21 NOTE — TOC Initial Note (Signed)
Transition of Care (TOC) - Initial/Assessment Note  Donn Pierini RN,BSN Transitions of Care Unit 4NP (Non Trauma)- RN Case Manager See Treatment Team for direct Phone #   Patient Details  Name: Susan Evans MRN: [id number] Date of Birth: 12/02/1955  Transition of Care [facility]) CM/SW Contact:    Darrold Span, RN Phone Number: 03/21/2023, 3:01 PM  Clinical Narrative:                 CM in to speak with pt and sister at bedside regarding transition plans, bedside RN had sent msg that sister wanted to speak with TOC about pt going to [facility].  MD team had also sent msg about possible LTACH.   Discussed with pt and sister the differences between [facility] and Intensive INPT rehab like [facility]. Provided choice options for [facility]ACH- [facility] and [facility]. Sister voiced they were familiar with [facility] and [facility] as they had a family member go to both after a stroke- started at [facility] then went to [facility]. Explained the different levels of intensity and that the facilities could screen pt to see if she met eligibility requirements. Sister voiced she would like more info on [facility] to be able to share with family so they can discuss all options over the weekend. Agreeable to have [facility] liaison reach out to answer questions and share info on [facility]. CM will also provide brochure for info.  Cm also shared that whichever why the family decided to proceed LTACH vs INPT rehab- facility would have to offer bed and get insurance approval - pt and sister voiced understanding.   Family to discuss options over the weekend and CM to follow up on Monday.   [facility] liaison notified with request for info and will reach out to sister.   Expected Discharge Plan: Long Term Acute Care (LTAC) Barriers to Discharge: Continued Medical Work up   Patient Goals and CMS Choice Patient states their goals for this hospitalization and ongoing recovery are:: rehab with hopes to return home CMS Medicare.gov  Compare Post Acute Care list provided to:: Patient Choice offered to / list presented to : Patient, Sibling      Expected Discharge Plan and Services   Discharge Planning Services: CM Consult Post Acute Care Choice: IP Rehab, Long Term Acute Care (LTAC) Living arrangements for the past 2 months: Apartment                                      Prior Living Arrangements/Services Living arrangements for the past 2 months: Apartment Lives with:: Self Patient language and need for interpreter reviewed:: Yes Do you feel safe going back to the place where you live?: Yes      Need for Family Participation in Patient Care: Yes (Comment) Care giver support system in place?: Yes (comment) Current home services: DME Criminal Activity/Legal Involvement Pertinent to Current Situation/Hospitalization: No - Comment as needed  Activities of Daily Living      Permission Sought/Granted Permission sought to share information with : Facility Industrial/product designer granted to share information with : Yes, Verbal Permission Granted     Permission granted to share info w AGENCY: LTACH        Emotional Assessment Appearance:: Appears stated age Attitude/Demeanor/Rapport: Engaged Affect (typically observed): Accepting, Appropriate Orientation: : Oriented to Self, Oriented to Place, Oriented to Situation, Oriented to  Time Alcohol / Substance Use: Not Applicable Psych  Involvement: No (comment)  Admission diagnosis:  Status post tracheostomy [facility]) [Z93.0] Patient Active Problem List   Diagnosis Date Noted   Angio-edema 03/20/2023   Protein-calorie malnutrition, severe 03/19/2023   Bacteremia due to group B Streptococcus 03/17/2023   Status post tracheostomy (HCC) 03/11/2023   Acquired hearing loss 02/06/2022   Other mechanical complication of surgically created arteriovenous shunt, initial encounter (HCC) 12/25/2021   Acute hypoxic respiratory failure (HCC) 12/21/2021    Difficult airway 12/21/2021   Hollenhorst plaque, 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   Other disorders of calcium metabolism 02/01/2021   Low weight 01/11/2021   ESRD (end stage renal disease) (HCC) 05/23/2020   Mild protein-calorie malnutrition (HCC) 05/16/2020   Microcytic anemia 05/12/2020   Pruritus, unspecified 05/09/2020   Iron deficiency 05/08/2020   Malignant neoplasm of pharynx, unspecified (HCC) 05/08/2020   Secondary hyperparathyroidism of renal origin (HCC) 05/08/2020   Hypertensive chronic kidney disease with stage 1 through stage 4 chronic kidney disease, or unspecified chronic kidney disease 05/08/2020   Tobacco use 06/28/2019   Allergic rhinitis 04/18/2014   Osteoporosis 06/29/2013   Health care maintenance 04/12/2013   Hyperlipidemia    Graves disease 04/10/2012   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   Essential hypertension 08/19/2006   Type 2 diabetes mellitus with complication (HCC) 09/24/1995   PCP:  Kathleen Lime, MD Pharmacy:   [facility] [phone] [place] Otto, [place] - [address] [place] [address] Phone: [phone] Fax: [phone]     Social Determinants of Health (SDOH) Social History: SDOH Screenings   Food Insecurity: No Food Insecurity (03/13/2022)  Housing: Low Risk  (03/13/2022)  Transportation Needs: No Transportation Needs (03/13/2022)  Alcohol Screen: Low Risk  (03/13/2022)  Depression (PHQ2-9): Medium Risk (03/13/2022)  Financial Resource Strain: Low Risk  (03/13/2022)  Physical Activity: Insufficiently Active (03/13/2022)  Social Connections: Socially Integrated (03/13/2022)  Stress: No Stress Concern Present (03/13/2022)  Tobacco Use: Medium Risk (03/12/2023)   SDOH Interventions:     Readmission Risk Interventions     No data to display

## 2023-03-22 LAB — CBC
HCT: 25.7 % — ABNORMAL LOW (ref 36.0–46.0)
Hemoglobin: 7.9 g/dL — ABNORMAL LOW (ref 12.0–15.0)
MCH: 23.7 pg — ABNORMAL LOW (ref 26.0–34.0)
MCHC: 30.7 g/dL (ref 30.0–36.0)
MCV: 77.2 fL — ABNORMAL LOW (ref 80.0–100.0)
Platelets: 236 10*3/uL (ref 150–400)
RBC: 3.33 MIL/uL — ABNORMAL LOW (ref 3.87–5.11)
RDW: 14.4 % (ref 11.5–15.5)
WBC: 10.9 10*3/uL — ABNORMAL HIGH (ref 4.0–10.5)
nRBC: 0 % (ref 0.0–0.2)

## 2023-03-22 LAB — C4 COMPLEMENT: Complement C4, Body Fluid: 41 mg/dL — ABNORMAL HIGH (ref 12–38)

## 2023-03-22 LAB — GLUCOSE, CAPILLARY
Glucose-Capillary: 120 mg/dL — ABNORMAL HIGH (ref 70–99)
Glucose-Capillary: 130 mg/dL — ABNORMAL HIGH (ref 70–99)
Glucose-Capillary: 168 mg/dL — ABNORMAL HIGH (ref 70–99)
Glucose-Capillary: 184 mg/dL — ABNORMAL HIGH (ref 70–99)
Glucose-Capillary: 308 mg/dL — ABNORMAL HIGH (ref 70–99)

## 2023-03-22 LAB — RENAL FUNCTION PANEL
Albumin: 2.3 g/dL — ABNORMAL LOW (ref 3.5–5.0)
Anion gap: 10 (ref 5–15)
BUN: 71 mg/dL — ABNORMAL HIGH (ref 8–23)
CO2: 33 mmol/L — ABNORMAL HIGH (ref 22–32)
Calcium: 8.9 mg/dL (ref 8.9–10.3)
Chloride: 91 mmol/L — ABNORMAL LOW (ref 98–111)
Creatinine, Ser: 4.12 mg/dL — ABNORMAL HIGH (ref 0.44–1.00)
GFR, Estimated: 11 mL/min — ABNORMAL LOW (ref >60)
Glucose, Bld: 121 mg/dL — ABNORMAL HIGH (ref 70–99)
Phosphorus: 3.6 mg/dL (ref 2.5–4.6)
Potassium: 4.2 mmol/L (ref 3.5–5.1)
Sodium: 134 mmol/L — ABNORMAL LOW (ref 135–145)

## 2023-03-22 MED ORDER — DARBEPOETIN ALFA 40 MCG/0.4ML IJ SOSY
40.0000 ug | PREFILLED_SYRINGE | INTRAMUSCULAR | Status: DC
  Administered 2023-03-22: 40 ug via SUBCUTANEOUS
  Filled 2023-03-22: qty 0.4

## 2023-03-22 MED ORDER — ONDANSETRON HCL 4 MG/2ML IJ SOLN
4.0000 mg | Freq: Four times a day (QID) | INTRAMUSCULAR | Status: DC | PRN
  Administered 2023-03-23 – 2023-03-30 (×4): 4 mg via INTRAVENOUS
  Filled 2023-03-22 (×5): qty 2

## 2023-03-22 MED ORDER — HEPARIN SODIUM (PORCINE) 1000 UNIT/ML DIALYSIS: 1500.0000 [IU] | INTRAMUSCULAR | Status: DC | PRN

## 2023-03-22 NOTE — Progress Notes (Signed)
POST HD TX NOTE  03/22/23 1226  Vitals  Temp 98.1 F (36.7 C)  Temp Source Oral  BP (!) 101/51  MAP (mmHg) 67  BP Location Left Arm  BP Method Automatic  Patient Position (if appropriate) Lying  Pulse Rate  (not picking up)  Pulse Rate Source Monitor  ECG Heart Rate 100  Resp (!) 22  Oxygen Therapy  SpO2  (not picking up)  O2 Device Room Air  Pulse Oximetry Type Continuous  During Treatment Monitoring  Intra-Hemodialysis Comments (S)   (post HD tx VS check)  Post Treatment  Dialyzer Clearance Lightly streaked  Duration of HD Treatment -hour(s) 4 hour(s)  Hemodialysis Intake (mL) 100 mL (ancef 2g ivpb)  Liters Processed 96  Fluid Removed (mL) 1000 mL  Tolerated HD Treatment Yes  Post-Hemodialysis Comments (S)  tx completed w/o problem, UF goal met, blood rinsed back, VSS although we couldn't get an O2 sat reading entire tx despite switching sites, pt showed no distress. Medication Admin: Heparin 2000 units bolus, Ancef 2G IVPB  Fistula / Graft Right Upper arm Arteriovenous fistula  Placement Date/Time: 03/11/23 1900   Placed prior to admission: Yes  Orientation: Right  Access Location: Upper arm  Access Type: (c) Arteriovenous fistula  Site Condition No complications  Fistula / Graft Assessment Bruit;Thrill;Present  Drainage Description None

## 2023-03-22 NOTE — Progress Notes (Signed)
Patient is in dialysis at the moment, not available for CPT. RT will attempt again at next schedule time.

## 2023-03-22 NOTE — Plan of Care (Signed)

## 2023-03-22 NOTE — Progress Notes (Incomplete)
HD#11 SUBJECTIVE:  Patient Summary: Susan Evans is a 67 y.o. with a pertinent PMH of hypertension, type 2 diabetes, ESRD on dialysis TTS , who presented with angioedema requiring intubation now stable on the medical floor with MSSA bacteremia and ongoing dysphagia.   Overnight Events: Patient has had some nausea with vomiting since HD yesterday.   Interim History: Patient has stabilized from a N/V standpoint at this time however explains that she was on HD for much longer than her normal yesterday.  OBJECTIVE:  Vital Signs: Vitals:   03/22/23 1226 03/22/23 1300 03/22/23 1533 03/22/23 1606  BP: (!) 101/51 99/61 121/62   Pulse:  (!) 107 98 91  Resp: (!) 22 16 20  (!) 23  Temp: 98.1 F (36.7 C) 98.2 F (36.8 C) 98.1 F (36.7 C)   TempSrc: Oral Oral Oral   SpO2:  94% 96% 96%  Weight: 35.8 kg     Height:       Supplemental O2: Room Air SpO2: 96 % O2 Flow Rate (L/min): 4 L/min FiO2 (%): (S) 55 %  Filed Weights   03/19/23 0500 03/22/23 0741 03/22/23 1226  Weight: 38.5 kg 36.8 kg 35.8 kg     Intake/Output Summary (Last 24 hours) at 03/22/2023 1636 Last data filed at 03/22/2023 1500 Gross per 24 hour  Intake 1100 ml  Output 1000 ml  Net 100 ml    Net IO Since Admission: 4,074.41 mL [03/22/23 1636]  Physical Exam: Constitutional:Resting comfortably in bedside recliner. In no acute distress. Cardio:Regular rate and rhythm. No murmurs, rubs, or gallops. Pulm:Clear to auscultation bilaterally. Normal work of breathing on room air. ZOX:WRUEAVWU for extremity edema. Skin:Warm and dry. Neuro:Alert and oriented x3. No focal deficit noted. Psych:Pleasant mood and affect.  Patient Lines/Drains/Airways Status     Active Line/Drains/Airways     Name Placement date Placement time Site Days   Peripheral IV 03/20/23 22 G 1" Left;Anterior Forearm 03/20/23  1632  Forearm  1   Fistula / Graft Right Upper arm Arteriovenous fistula 03/11/23  1900  Upper arm  10   Incision  (Closed) 12/21/21 Arm Left;Upper 12/21/21  1554  -- 455   Incision (Closed) 03/11/23 Lip Other (Comment) 03/11/23  1434  -- 10   Small Bore Feeding Tube Left nare Marking at nare/corner of mouth 63 cm 03/12/23  1427  Left nare  9             ASSESSMENT/PLAN:  Assessment: Principal Problem:   Bacteremia due to group B Streptococcus Active Problems:   Type 2 diabetes mellitus with complication (HCC)   Essential hypertension   ESRD (end stage renal disease) (HCC)   Acute hypoxic respiratory failure (HCC)   Microcytic anemia   Malignant neoplasm of pharynx, unspecified (HCC)   Protein-calorie malnutrition, severe   Angio-edema   Plan:   #Group B strep bacteremia Afebrile, HDS, mild leukocytosis with slight increase to 13.2. Has been hypotensive since HD overnight where she had 1L fluid removed, but this has improved and she has been afebrile without other signs of acute or progression of infection. Continues on IV cefazolin, end-date 04/23/2023. Unable to get TEE. -Continue IV cefazolin, last day 04/23/2023 -Trend CBC, fever curve; would consider re-consulting ID if WBC continues to trend upwards tomorrow   #Angioedema, resolved #Epiglottis edema #Hx of radiation therapy to neck #Dysphagia Unable to safely swallow. Lengthy discussion with patient and family 06/28 regarding continued CorTrak for temporary nutrition while going to either LTAC or CIR for ongoing  speech/swallowing therapy. She is not having any respiratory distress fortunately and is saturating well on room air, uses a suction to manage secretions. SW team aware of situation and engaged in ongoing discussions with family regarding options. -SLP following -F/u ongoing conversations re: dispo planning with SW team -Continue CorTrak, TF for now with NPO status -If respiratory compromise please page PCCM +/- ENT  #Anemia Appears chronic in nature, with Hgb fluctuating this admission. No signs of active bleed at this  time. Iron studies obtained 06/29 showed ferritin very high at 6385, iron normal 155, TIBC 168, saturation ratio 92%. In setting of GBS bacteremia, I suspect ferritin elevation is related to acute inflammatory state however would expect saturation ratio to be low. Concerning that this picture may fit a thalassemia picture, which has been briefly considered in the past based on chart review, however would recommend repeat labs once she is through acute inflammatory state.  -Trend CBC   #ESRD on HD TTS HD yesterday with 1L fluid removed, renal function fluctuating as expected.   -Trend BMP, small doses of lokelma as needed -HD per nephrology   #Hypertension Current regimen is hydralazine 50 mg BID and carvedilol 6.25 mg BID. AM doses held today due to soft BP, suspect in setting of having 1L removed at HD yesterday, but BP trending normal again. -Continue hydralazine 50 mg BID and carvedilol 6.25 mg BID   #Type 2 diabetes mellitus CBG range 120-308 over previous 24h. On semglee 5 units daily at bedtime started 06/28, remains on SSI. Required 27 units total yesterday including semglee. -CBG per unit protocol -Increase semglee to 8 units daily at bedtime, SSI. -If insulin need stabilizes, can consider transitioning to set doses as she is on TF; however, given continued intermittent need for high dose SSI, will keep with plan above for now   #GERD -Continue famotidine 20 mg daily    #Carotid stenosis -Continue aspirin 81 mg daily -Continue atorvastatin 80 mg daily  Best Practice: Diet: NPO, CorTrak w/ TF IVF: None VTE: heparin injection 5,000 Units Start: 03/16/23 2200 SCDs Start: 03/11/23 1504 Code: Full AB: Cefazolin DISPO: Anticipated discharge pending  dispo planning .  Signature: Champ Mungo, D.O.  Internal Medicine Resident, PGY-2 [facility]  Pager: [phone]   Please contact the on call pager after 5 pm and on weekends at [phone].

## 2023-03-22 NOTE — Progress Notes (Addendum)
[facility] Progress Note   Subjective:   seen in room, no c/o's  Objective Vitals:   03/22/23 0300 03/22/23 0741 03/22/23 0801 03/22/23 0830  BP: (!) 124/92 (!) 151/67 (!) 155/72 (!) 175/67  Pulse: 99     Resp: 15 (!) 21 (!) 21 20  Temp:  98.3 F (36.8 C)    TempSrc:  Oral    SpO2: 97%     Weight:  36.8 kg    Height:       Physical Exam General: Thin, Chronically ill appearing female in NAD Heart: SR on monitor. No M/R/G Lungs: CTAB A/P Abdomen: NABS flat, cortak feeding tube in place Extremities:no LE edema Dialysis Access: R AVG+T/B  Additional Objective Labs: Basic Metabolic Panel: Recent Labs  Lab 03/20/23 0834 03/20/23 1550 03/21/23 0429 03/21/23 1233 03/22/23 0316  NA 132*  --  132* 133* 134*  K 4.8   < > 5.8* 5.2* 4.2  CL 92*  --  92* 91* 91*  CO2 28  --  30 31 33*  GLUCOSE 248*  --  272* 174* 121*  BUN 26*  --  45* 54* 71*  CREATININE 2.39*  --  3.43* 3.68* 4.12*  CALCIUM 8.5*  --  8.6* 8.9 8.9  PHOS 2.5  --  2.8  --  3.6   < > = values in this interval not displayed.   Liver Function Tests: Recent Labs  Lab 03/20/23 0834 03/21/23 0429 03/22/23 0316  ALBUMIN 2.7* 2.5* 2.3*   No results for input(s): "LIPASE", "AMYLASE" in the last 168 hours. CBC: Recent Labs  Lab 03/18/23 0431 03/19/23 0102 03/20/23 0834 03/21/23 0429 03/22/23 0316  WBC 13.3* 9.8 10.9* 9.2 10.9*  HGB 9.7* 9.0* 10.4* 9.2* 7.9*  HCT 30.3* 28.7* 34.2* 30.5* 25.7*  MCV 74.3* 78.2* 79.9* 78.2* 77.2*  PLT 183 188 223 230 236   Blood Culture    Component Value Date/Time   SDES BLOOD LEFT HAND 03/12/2023 1023   SPECREQUEST  03/12/2023 1023    BOTTLES DRAWN AEROBIC ONLY Blood Culture results may not be optimal due to an inadequate volume of blood received in culture bottles   CULT  03/12/2023 1023    NO GROWTH 5 DAYS Performed at [facility], [address], [place], [place] [address]    REPTSTATUS 03/17/2023 FINAL 03/12/2023 1023    Cardiac  Enzymes: No results for input(s): "CKTOTAL", "CKMB", "CKMBINDEX", "TROPONINI" in the last 168 hours. CBG: Recent Labs  Lab 03/21/23 1140 03/21/23 1530 03/21/23 2012 03/21/23 2316 03/22/23 0327  GLUCAP 189* 165* 162* 160* 130*   Iron Studies: No results for input(s): "IRON", "TIBC", "TRANSFERRIN", "FERRITIN" in the last 72 hours. @lablastinr3 @ Studies/Results: DG Swallowing Func-Speech Pathology  Result Date: 03/20/2023 Table formatting from the original result was not included. Modified Barium Swallow Study Patient Details Name: Susan Evans MRN: [id number] Date of Birth: [birth year] Today's Date: 03/20/2023 HPI/PMH: HPI: Susan Evans is a 67yo female who presented 6/18 with lip swelling noted at dialysis where they attempted extra volume removal but she did not tolerate this r/t tachycardia and malaise so she was sent to ER where her lip swelling progressed. She also developed SOB and hoarse voice and ENT was consulted. She was ultimately taken to OR and nasally intubated by ENT. ETT 6/18-24. 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 Clinical Impression: Clinical Impression: Pt presents with a profound pharygeal dysphagia with swallow function largely  unchaged from 6/25 despite presumed resolution of acute angioedmea, though fullness of pharyngeal structures, particularly the epiglottis and arytenoids, perists.  Pharyngeal impairments include delayed swallow initiation, absent epiglottic inversion, reduced anterior hyoid movement, minimal to no laryngeal elevation, decreased pharyngeal stripping wave, incomplete laryngeal closure, significantly reduced UES opening, and diminished sensation.  There was very minimal pharyngeal clearance of all bolus trials. These deficits resulted in frank aspiration of thin and honey thick liquid during the swallow without immediate cough response.  There was penetration of puree. There was silent aspiration of  residuals after the swallow. Cued cough was benefical for clearing a portion of aspiration and was able to greatly decrease or remove pharyngeal residuals.  Effortful swallow was not beneficial for pharyngeal clearance or prevention of penetration/aspiration.  Pt cannot safely consume POs at this time.  Recommend continued alternate means of nutrition, hydration, and medication.  It would be reasonable to allow ice chips and small sips of water for comfort, in moderation, after good oral care, when fully awake/alert, with upright positioning and direct supervision.  Per conversation with treating team, angioedema has resolved and any post extubation dysphagia following removal of ETT 6/24 has likely resolved in this time frame as well.  It is likely that pt has chronic dysphagia following H&N radiation in 2011. If this MBSS is at or near pt's baseline swallow function, recommend discussion around long term AMN and goals of care.  Perhaps further assessment from ENT may help to shed light on ultimate cause of pt's deficits and potential for recovery.  Swallowing prognosis following remote XRT is generally poor. Pt's dysphagia is unlikely to respond well to therapeutic inteventions, with only marginal if any gains anticipated with 6-8 week course of regular, aggressive pharyngeal exercise program. SLP will continue to follow for education and re-evaluation if indicated. Factors that may increase risk of adverse event in presence of aspiration Rubye Oaks & Clearance Coots 2021): Factors that may increase risk of adverse event in presence of aspiration Rubye Oaks & Clearance Coots 2021): Frail or deconditioned; Presence of tubes (ETT, trach, NG, etc.); Poor general health and/or compromised immunity; Frequent aspiration of large volumes Recommendations/Plan: Swallowing Evaluation Recommendations Swallowing Evaluation Recommendations Recommendations: NPO; Alternative means of nutrition - G Tube Medication Administration: Via alternative means  Oral care recommendations: Oral care QID (4x/day); Staff/trained caregiver to provide oral care; Oral care before ice chips/water Recommended consults: Consider ENT consultation Treatment Plan Treatment Plan Treatment recommendations: Therapy as outlined in treatment plan below Follow-up recommendations: Follow physicians's recommendations for discharge plan and follow up therapies Functional status assessment: Patient has had a recent decline in their functional status and demonstrates the ability to make significant improvements in function in a reasonable and predictable amount of time. Treatment frequency: Min 2x/week Treatment duration: 2 weeks Interventions: Patient/family education; Aspiration precaution training Recommendations Recommendations for follow up therapy are one component of a multi-disciplinary discharge planning process, led by the attending physician.  Recommendations may be updated based on patient status, additional functional criteria and insurance authorization. Assessment: Orofacial Exam: Orofacial Exam Oral Cavity: Oral Hygiene: WFL Oral Cavity - Dentition: Edentulous Orofacial Anatomy: WFL Oral Motor/Sensory Function: -- (see BSE from 6/25) Anatomy: Anatomy: -- (Edema) Boluses Administered: Boluses Administered Boluses Administered: Thin liquids (Level 0); Moderately thick liquids (Level 3, honey thick); Puree  Oral Impairment Domain: Oral Impairment Domain Lip Closure: No labial escape Tongue control during bolus hold: Posterior escape of less than half of bolus Bolus preparation/mastication: Timely and efficient chewing and mashing Bolus transport/lingual motion:  Brisk tongue motion Oral residue: Trace residue lining oral structures Location of oral residue : Tongue Initiation of pharyngeal swallow : Valleculae  Pharyngeal Impairment Domain: Pharyngeal Impairment Domain Soft palate elevation: No bolus between soft palate (SP)/pharyngeal wall (PW) Laryngeal elevation: Minimal superior  movement of thyroid cartilage with minimal approximation of arytenoids to epiglottic petiole Anterior hyoid excursion: Partial anterior movement Epiglottic movement: No inversion Laryngeal vestibule closure: Incomplete, narrow column air/contrast in laryngeal vestibule Pharyngeal stripping wave : Absent Pharyngeal contraction (A/P view only): N/A Pharyngoesophageal segment opening: Minimal distention/minimal duration, marked obstruction of flow Tongue base retraction: Wide column of contrast or air between tongue base and PPW Pharyngeal residue: Majority of contrast within or on pharyngeal structures Location of pharyngeal residue: Valleculae; Aryepiglottic folds; Pyriform sinuses  Esophageal Impairment Domain: Esophageal Impairment Domain Esophageal clearance upright position: -- (Not assessed) Pill: Pill Consistency administered: -- (Not assessed) Penetration/Aspiration Scale Score: Penetration/Aspiration Scale Score 2.  Material enters airway, remains ABOVE vocal cords then ejected out: Puree 8.  Material enters airway, passes BELOW cords without attempt by patient to eject out (silent aspiration) : Thin liquids (Level 0); Moderately thick liquids (Level 3, honey thick) Compensatory Strategies: Compensatory Strategies Compensatory strategies: Yes Effortful swallow: Ineffective Oral bolus hold: Ineffective Other(comment): -- (Cued cough; partially effective)   General Information: Caregiver present: No  Diet Prior to this Study: NPO; Cortrak/Small bore NG tube   No data recorded  Respiratory Status: WFL   Supplemental O2: Nasal cannula   History of Recent Intubation: Yes  Behavior/Cognition: Alert; Cooperative; Pleasant mood No data recorded Baseline vocal quality/speech: Dysphonic No data recorded No data recorded Exam Limitations: No limitations Goal Planning: Prognosis for improved oropharyngeal function: Guarded Barriers to Reach Goals: Severity of deficits; Time post onset No data recorded Patient/Family  Stated Goal: not stated Consulted and agree with results and recommendations: Patient; Physician Pain: Pain Assessment Pain Assessment: No/denies pain Faces Pain Scale: 0 Facial Expression: 0 Body Movements: 0 Muscle Tension: 0 Compliance with ventilator (intubated pts.): N/A Vocalization (extubated pts.): 0 CPOT Total: 0 Pain Intervention(s): Monitored during session End of Session: Start Time:No data recorded Stop Time: No data recorded Time Calculation:No data recorded Charges: No data recorded SLP visit diagnosis: SLP Visit Diagnosis: Dysphagia, pharyngoesophageal phase (R13.14) Past Medical History: Past Medical History: Diagnosis Date  AKI (acute kidney injury) (HCC) 08/12/2019  Anaphylactic shock, unspecified, initial encounter 05/09/2020  Chronic kidney disease   Dialysis T/Th/Sat- [address]  CKD stage G1/A3, GFR > 90 and albumin creatinine ratio >300 mg/g 07/08/2016  Nephrotic range proteinuria  Diabetes mellitus   type 2   Dyspnea   "when I have too much fluid."  Ganglion cyst   right wrist  GERD (gastroesophageal reflux disease)   History of radiation therapy 02/27/10 -04/13/10  left base of tongue, bilat neck  Hyperlipidemia   Hypertension   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/21/2021  Iron deficiency anemia   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  Menopause   Thyroid disease   Hyperthyroidism  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/08/2020  Tobacco abuse   PT HAS QUIT Past Surgical History: Past Surgical History: Procedure Laterality Date  AV FISTULA PLACEMENT Left 06/02/2020  Procedure: INSERTION OF ARTERIOVENOUS (AV) GORE-TEX GRAFT ARM LEFT;  Surgeon: Cephus Shelling, MD;  Location: [facility] OR;  Service: Vascular;  Laterality: Left;  AV FISTULA PLACEMENT Right 12/27/2020  Procedure: RIGHT ARM CEPHALIC ARTERIOVENOUS FISTULA CREATION;  Surgeon: Maeola Harman, MD;  Location: [facility] OR;  Service: Vascular;  Laterality: Right;  AVGG REMOVAL Left  12/21/2021  Procedure: EXCISION OF LEFT ARM ARTERIOVENOUS GORETEX GRAFT (AVGG);  Surgeon: Maeola Harman, MD;  Location: [facility] OR;  Service: Vascular;  Laterality: Left;  IR FLUORO GUIDE CV LINE LEFT  05/15/2020  IR REMOVAL TUN CV CATH W/O FL  05/15/2020  IR REMOVAL TUN CV CATH W/O FL  06/15/2021  IR US GUIDE VASC ACCESS LEFT  05/15/2020  NECK SURGERY    REDUCTION MAMMAPLASTY    TRACHEOSTOMY TUBE PLACEMENT N/A 03/11/2023  Procedure: AWAKE INTUBATION;  Surgeon: Christia Reading, MD;  Location: [facility] OR;  Service: ENT;  Laterality: N/A; Kerrie Pleasure, MA, CCC-SLP Acute Rehabilitation Services Office: [phone] 03/20/2023, 11:29 AM  Medications:  sodium chloride Stopped (03/13/23 1623)    ceFAZolin (ANCEF) IV 2 g (03/20/23 1842)   feeding supplement (NEPRO CARB STEADY) 1,000 mL (03/21/23 1414)    aspirin  81 mg Per Tube Daily   atorvastatin  80 mg Per Tube Daily   carvedilol  6.25 mg Per Tube BID WC   Chlorhexidine Gluconate Cloth  6 each Topical Daily   Chlorhexidine Gluconate Cloth  6 each Topical Q0600   doxercalciferol  2 mcg Intravenous Q T,Th,Sa-HD   famotidine  10 mg Per Tube Daily   heparin  5,000 Units Subcutaneous Q12H   hydrALAZINE  50 mg Per Tube BID   insulin aspart  0-15 Units Subcutaneous Q4H   insulin glargine-yfgn  5 Units Subcutaneous QHS   loratadine  10 mg Per Tube Daily   multivitamin  1 tablet Per Tube QHS   polyethylene glycol  17 g Per Tube Daily   sevelamer carbonate  800 mg Per Tube TID   thiamine  100 mg Per Tube Daily     [facility]     4h  350/500   36kg  3K/2.5Ca bath  RUA AVF  Hep 2000+ 2000 midrun  - last OP HD 6/18, post wt 38.4  - hectorol 2 mcg IV three times per week  - coming off at dry wt most of last 2 wks    6/26 CXR - no active disease   Assessment/ Plan Angioedema: occurred 12/2021 as well.  Both times this has happened have been in the setting of infection but that's the only common link, not sure that it was the same organism anyway.  CT  maxillofacial showed soft tissue swelling/ cellulitis w/o abscess.  No sign of AVF infection on exam.  Pt required nasal intubation by ENT. Pt rec'd a course of decadron and pepcid. C1 esterase inhibitor < 8, but this can occur with sepsis too. Will need OP labs and possibly allergy appt. Pt is already dialyzing on a hypoallergenic dialyzer at OP dialysis for the past year. Will keep this on indefinitely.   Acute resp failure - was intubated nasally by ENT initially. F/u video laryngoscope done by CCM showed improved swelling of the cords. Pt was extubated successfully.  Hyperkalemia: K+ 4.6 this AM. Hyperkalemia resolved with Lokelma and TF change.  ESRD - TTS HD. Had HD overnight this am, next HD on 03/22/2023 S agalactiae bacteremia: as above in #1, on cefazolin.  Per pharm and ID she will need IV Ancef 2gm post HD TTS through 7/31. VVS consulted to assess her AVF, they found no signs of infection. Appreciate assistance.  HTN/ vol: Clonidine patch stopped for low BP's. Current BP's wnl, some on the high side, getting per tube coreg and hydralazine. Under EDW  today. UF as tolerated today. UFG 1-2 liters.   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 Check iron panel next blood draw.   Metabolic Bone Disease: Low PO4 on TF. Hold binders. Corrected Ca+ elevated. Hold VDRA.  Nutrition - pt is getting TF's w/ Osmolite. K+ was up to 5.8 . Gave Lokelma and changed TF to Nepro. Resolved.   Rita H. Brown NP-C 03/22/2023, 8:53 AM  [facility] [phone]  Pt seen, examined and agree w A/P as above.  Vinson Moselle MD  [facility] 03/22/2023, 12:17 PM

## 2023-03-23 ENCOUNTER — Encounter (HOSPITAL_COMMUNITY): Payer: Self-pay | Admitting: Otolaryngology

## 2023-03-23 DIAGNOSIS — R1314 Dysphagia, pharyngoesophageal phase: Secondary | ICD-10-CM | POA: Insufficient documentation

## 2023-03-23 DIAGNOSIS — R7881 Bacteremia: Secondary | ICD-10-CM | POA: Diagnosis not present

## 2023-03-23 DIAGNOSIS — B951 Streptococcus, group B, as the cause of diseases classified elsewhere: Secondary | ICD-10-CM | POA: Diagnosis not present

## 2023-03-23 LAB — GLUCOSE, CAPILLARY
Glucose-Capillary: 230 mg/dL — ABNORMAL HIGH (ref 70–99)
Glucose-Capillary: 173 mg/dL — ABNORMAL HIGH (ref 70–99)
Glucose-Capillary: 197 mg/dL — ABNORMAL HIGH (ref 70–99)
Glucose-Capillary: 170 mg/dL — ABNORMAL HIGH (ref 70–99)
Glucose-Capillary: 133 mg/dL — ABNORMAL HIGH (ref 70–99)
Glucose-Capillary: 172 mg/dL — ABNORMAL HIGH (ref 70–99)

## 2023-03-23 LAB — CBC
HCT: 23.3 % — ABNORMAL LOW (ref 36.0–46.0)
Hemoglobin: 7.3 g/dL — ABNORMAL LOW (ref 12.0–15.0)
MCH: 24.2 pg — ABNORMAL LOW (ref 26.0–34.0)
MCHC: 31.3 g/dL (ref 30.0–36.0)
MCV: 77.2 fL — ABNORMAL LOW (ref 80.0–100.0)
Platelets: 202 10*3/uL (ref 150–400)
RBC: 3.02 MIL/uL — ABNORMAL LOW (ref 3.87–5.11)
RDW: 14.5 % (ref 11.5–15.5)
WBC: 13.2 10*3/uL — ABNORMAL HIGH (ref 4.0–10.5)
nRBC: 0.5 % — ABNORMAL HIGH (ref 0.0–0.2)

## 2023-03-23 LAB — RENAL FUNCTION PANEL
Albumin: 2.3 g/dL — ABNORMAL LOW (ref 3.5–5.0)
Anion gap: 15 (ref 5–15)
BUN: 46 mg/dL — ABNORMAL HIGH (ref 8–23)
CO2: 25 mmol/L (ref 22–32)
Calcium: 8.3 mg/dL — ABNORMAL LOW (ref 8.9–10.3)
Chloride: 94 mmol/L — ABNORMAL LOW (ref 98–111)
Creatinine, Ser: 2.73 mg/dL — ABNORMAL HIGH (ref 0.44–1.00)
GFR, Estimated: 19 mL/min — ABNORMAL LOW (ref >60)
Glucose, Bld: 200 mg/dL — ABNORMAL HIGH (ref 70–99)
Phosphorus: 2.9 mg/dL (ref 2.5–4.6)
Potassium: 4.2 mmol/L (ref 3.5–5.1)
Sodium: 134 mmol/L — ABNORMAL LOW (ref 135–145)

## 2023-03-23 LAB — FERRITIN: Ferritin: 6385 ng/mL — ABNORMAL HIGH (ref 11–307)

## 2023-03-23 LAB — IRON AND TIBC
Iron: 155 ug/dL (ref 28–170)
Saturation Ratios: 92 % — ABNORMAL HIGH (ref 10.4–31.8)
TIBC: 168 ug/dL — ABNORMAL LOW (ref 250–450)
UIBC: 13 ug/dL

## 2023-03-23 NOTE — Plan of Care (Signed)

## 2023-03-23 NOTE — Progress Notes (Addendum)
[facility] Progress Note   Subjective: Seen in room, up in recliner. No C/Os.     Objective Vitals:   03/23/23 0327 03/23/23 0400 03/23/23 0500 03/23/23 0725  BP: (!) 124/53 110/62 (!) 108/57 (!) 118/56  Pulse: 92 95 97 97  Resp: (!) 22 (!) 23 (!) 22 18  Temp: 98.2 F (36.8 C)   98.1 F (36.7 C)  TempSrc: Oral   Oral  SpO2: 92% 95% 91% 93%  Weight:   35.6 kg   Height:       Expand All Collapse All    [facility] Progress Note    Subjective:   seen in room, no c/o's   Objective       Vitals:    03/22/23 0300 03/22/23 0741 03/22/23 0801 03/22/23 0830  BP: (!) 124/92 (!) 151/67 (!) 155/72 (!) 175/67  Pulse: 99        Resp: 15 (!) 21 (!) 21 20  Temp:   98.3 F (36.8 C)      TempSrc:   Oral      SpO2: 97%        Weight:   36.8 kg      Height:            Physical Exam General: Thin, Chronically ill appearing female in NAD Heart: SR on monitor. No M/R/G Lungs: CTAB A/P Abdomen: NABS flat, cortak feeding tube in place Extremities:no LE edema Dialysis Access: LAVG+T/B    Additional Objective Labs: Basic Metabolic Panel: Recent Labs  Lab 03/21/23 0429 03/21/23 1233 03/22/23 0316 03/23/23 0253  NA 132* 133* 134* 134*  K 5.8* 5.2* 4.2 4.2  CL 92* 91* 91* 94*  CO2 30 31 33* 25  GLUCOSE 272* 174* 121* 200*  BUN 45* 54* 71* 46*  CREATININE 3.43* 3.68* 4.12* 2.73*  CALCIUM 8.6* 8.9 8.9 8.3*  PHOS 2.8  --  3.6 2.9   Liver Function Tests: Recent Labs  Lab 03/21/23 0429 03/22/23 0316 03/23/23 0253  ALBUMIN 2.5* 2.3* 2.3*   No results for input(s): "LIPASE", "AMYLASE" in the last 168 hours. CBC: Recent Labs  Lab 03/19/23 0102 03/20/23 0834 03/21/23 0429 03/22/23 0316 03/23/23 0253  WBC 9.8 10.9* 9.2 10.9* 13.2*  HGB 9.0* 10.4* 9.2* 7.9* 7.3*  HCT 28.7* 34.2* 30.5* 25.7* 23.3*  MCV 78.2* 79.9* 78.2* 77.2* 77.2*  PLT 188 223 230 236 202   Blood Culture    Component Value Date/Time   SDES BLOOD LEFT HAND  03/12/2023 1023   SPECREQUEST  03/12/2023 1023    BOTTLES DRAWN AEROBIC ONLY Blood Culture results may not be optimal due to an inadequate volume of blood received in culture bottles   CULT  03/12/2023 1023    NO GROWTH 5 DAYS Performed at [facility], [address], [place], [place] [address]    REPTSTATUS 03/17/2023 FINAL 03/12/2023 1023    Cardiac Enzymes: No results for input(s): "CKTOTAL", "CKMB", "CKMBINDEX", "TROPONINI" in the last 168 hours. CBG: Recent Labs  Lab 03/22/23 1728 03/22/23 2016 03/22/23 2338 03/23/23 0325 03/23/23 0723  GLUCAP 308* 168* 120* 230* 173*   Iron Studies:  Recent Labs    03/23/23 0253  IRON 155  TIBC 168*  FERRITIN 6,385*   @lablastinr3 @ Studies/Results: No results found. Medications:  sodium chloride Stopped (03/13/23 1623)    ceFAZolin (ANCEF) IV 2 g (03/22/23 1740)   feeding supplement (NEPRO CARB STEADY) 45 mL/hr at 03/22/23 1500    aspirin  81 mg  Per Tube Daily   atorvastatin  80 mg Per Tube Daily   carvedilol  6.25 mg Per Tube BID WC   darbepoetin (ARANESP) injection - DIALYSIS  40 mcg Subcutaneous Q Sat-1800   famotidine  10 mg Per Tube Daily   heparin  5,000 Units Subcutaneous Q12H   hydrALAZINE  50 mg Per Tube BID   insulin aspart  0-15 Units Subcutaneous Q4H   insulin glargine-yfgn  5 Units Subcutaneous QHS   loratadine  10 mg Per Tube Daily   multivitamin  1 tablet Per Tube QHS   polyethylene glycol  17 g Per Tube Daily   thiamine  100 mg Per Tube Daily     [facility]     4h  350/500   36kg  3K/2.5Ca bath  RUA AVF   -Heparin  2000 units initial bolus + 2000 units IV midrun-Decrease dose on discharge-too high for weight.   - last OP HD 6/18, post wt 38.4  - hectorol 2 mcg IV three times per week  - coming off at dry wt most of last 2 wks    6/26 CXR - no active disease   Assessment/ Plan Angioedema: occurred 12/2021 as well.  Both times this has happened have been in the setting of infection but  that's the only common link, not sure that it was the same organism anyway.  CT maxillofacial showed soft tissue swelling/ cellulitis w/o abscess.  No sign of AVF infection on exam.  Pt required nasal intubation by ENT. Pt rec'd a course of decadron and pepcid. C1 esterase inhibitor < 8, but this can occur with sepsis too. Will need OP labs and possibly allergy appt. Pt is already dialyzing on a hypoallergenic dialyzer at OP dialysis for the past year. Will keep this on indefinitely.   Acute resp failure - was intubated nasally by ENT initially. F/u video laryngoscope done by CCM showed improved swelling of the cords. Pt was extubated successfully.  Hyperkalemia:  K+ 4.6 this AM. Hyperkalemia resolved with Lokelma and TF change.  ESRD - TTS HD. Had HD overnight this am, next HD on 03/25/2023 S agalactiae bacteremia: as above in #1, on cefazolin.  Per pharm and ID she will need IV Ancef 2gm post HD TTS through 7/31. VVS consulted to assess her AVF, they found no signs of infection. Appreciate assistance.  HTN/ vol: Clonidine patch stopped for low BP's. Current BP's wnl, some on the high side, getting per tube coreg and hydralazine. Under EDW today. UF as tolerated today. UFG 1-2 liters.   Anemia of ESRD: HGB 7.3. Start ESA 03/22/2023. Iron stores OK. Transfuse if HGB <7.0.  Metabolic Bone Disease: Low PO4 on TF. Hold binders. Corrected Ca+ elevated. Hold VDRA.  Nutrition - pt is getting TF's w/ Osmolite. K+ was up to 5.8 . Gave Lokelma and changed TF to Nepro. Resolved.   Susan Doxtater H. Jayleen Scaglione NP-C 03/23/2023, 10:47 AM  [employer] [employer] [phone]

## 2023-03-24 DIAGNOSIS — I120 Hypertensive chronic kidney disease with stage 5 chronic kidney disease or end stage renal disease: Secondary | ICD-10-CM | POA: Diagnosis not present

## 2023-03-24 DIAGNOSIS — R7881 Bacteremia: Secondary | ICD-10-CM | POA: Diagnosis not present

## 2023-03-24 DIAGNOSIS — B951 Streptococcus, group B, as the cause of diseases classified elsewhere: Secondary | ICD-10-CM | POA: Diagnosis not present

## 2023-03-24 DIAGNOSIS — E1122 Type 2 diabetes mellitus with diabetic chronic kidney disease: Secondary | ICD-10-CM | POA: Diagnosis not present

## 2023-03-24 DIAGNOSIS — K219 Gastro-esophageal reflux disease without esophagitis: Secondary | ICD-10-CM

## 2023-03-24 LAB — GLUCOSE, CAPILLARY
Glucose-Capillary: 146 mg/dL — ABNORMAL HIGH (ref 70–99)
Glucose-Capillary: 152 mg/dL — ABNORMAL HIGH (ref 70–99)
Glucose-Capillary: 194 mg/dL — ABNORMAL HIGH (ref 70–99)
Glucose-Capillary: 205 mg/dL — ABNORMAL HIGH (ref 70–99)
Glucose-Capillary: 211 mg/dL — ABNORMAL HIGH (ref 70–99)
Glucose-Capillary: 217 mg/dL — ABNORMAL HIGH (ref 70–99)

## 2023-03-24 LAB — CBC
HCT: 20 % — ABNORMAL LOW (ref 36.0–46.0)
Hemoglobin: 6.4 g/dL — CL (ref 12.0–15.0)
MCH: 25.2 pg — ABNORMAL LOW (ref 26.0–34.0)
MCHC: 32 g/dL (ref 30.0–36.0)
MCV: 78.7 fL — ABNORMAL LOW (ref 80.0–100.0)
Platelets: 255 10*3/uL (ref 150–400)
RBC: 2.54 MIL/uL — ABNORMAL LOW (ref 3.87–5.11)
RDW: 14.6 % (ref 11.5–15.5)
WBC: 13.9 10*3/uL — ABNORMAL HIGH (ref 4.0–10.5)
nRBC: 0 % (ref 0.0–0.2)

## 2023-03-24 LAB — RENAL FUNCTION PANEL
Albumin: 2.5 g/dL — ABNORMAL LOW (ref 3.5–5.0)
Anion gap: 16 — ABNORMAL HIGH (ref 5–15)
BUN: 83 mg/dL — ABNORMAL HIGH (ref 8–23)
CO2: 28 mmol/L (ref 22–32)
Calcium: 8.7 mg/dL — ABNORMAL LOW (ref 8.9–10.3)
Chloride: 91 mmol/L — ABNORMAL LOW (ref 98–111)
Creatinine, Ser: 4.04 mg/dL — ABNORMAL HIGH (ref 0.44–1.00)
GFR, Estimated: 12 mL/min — ABNORMAL LOW (ref >60)
Glucose, Bld: 207 mg/dL — ABNORMAL HIGH (ref 70–99)
Phosphorus: 3.8 mg/dL (ref 2.5–4.6)
Potassium: 4.4 mmol/L (ref 3.5–5.1)
Sodium: 135 mmol/L (ref 135–145)

## 2023-03-24 LAB — BPAM RBC: Blood Product Expiration Date: 202407162359

## 2023-03-24 LAB — PREPARE RBC (CROSSMATCH)

## 2023-03-24 MED ORDER — SODIUM CHLORIDE 0.9% IV SOLUTION: Freq: Once | INTRAVENOUS | Status: AC

## 2023-03-24 MED ORDER — CHLORHEXIDINE GLUCONATE CLOTH 2 % EX PADS: 6.0000 | MEDICATED_PAD | Freq: Every day | CUTANEOUS | Status: DC

## 2023-03-24 NOTE — Progress Notes (Signed)
Nutrition Follow-up  DOCUMENTATION CODES:   Underweight, Severe malnutrition in context of chronic illness  INTERVENTION:   - Recommend PEG placement in accordance with SLP recommendations  Continue tube feeds via Cortrak: - Nepro Carb Steady @ 45 ml/hr (1080 ml/day)  Tube feeding regimen provides 1912 kcal, 87.5 grams of protein, and 785 ml of H2O.   - Continue renal MVI daily per tube  NUTRITION DIAGNOSIS:   Severe Malnutrition related to chronic illness as evidenced by severe fat depletion, severe muscle depletion.  Ongoing, being addressed via TF  GOAL:   Patient will meet greater than or equal to 90% of their needs  Met via TF  MONITOR:   Diet advancement, Labs, Weight trends, Skin, TF tolerance  REASON FOR ASSESSMENT:   Ventilator, Consult Enteral/tube feeding initiation and management  ASSESSMENT:   Pt admitted with airway compromise d/t lip swelling during dialysis. PMH significant for ESRD on HD, DM, GERD, remote laryngeal cancer s/p surgery and XRT (2011).  06/18 - admitted, nasal intubation d/t glottic edema 06/19 - Cortrak placed (tip gastric), bridle not placed, TF initiated 06/24 - extubated 06/25 - MBS with recommendations for pt to remain NPO 06/26 - Cortrak bridled 06/27 - MBS with recommendations for pt to remain NPO 06/28 - tube feeds changed to Nepro per Nephrology MD  Spoke with pt at bedside. Pt is frustrated that she is still unable to have anything to eat or drink. Pt denies abdominal pain or discomfort but is frustrated about still having Cortrak feeding tube in her nose.  RN in room at time of RD visit. RN reports pt had nausea with vomiting 2 days ago but no nausea yesterday or today. On same date that pt has N/V, she also was having issues with hypotension.  Discussed pt with SLP who recommends PEG tube placement. During MBS on 6/27, pt with profound pharyngeal dysphagia with swallow function largely unchanged despite presumed  resolution of acute angioedema. Per SLP, this is unlikely to improve.  Last iHD was 6/29. Pt reports UF was high which caused hypotension. Next iHD planned for 7/02.  Admit weight: 41 kg Current weight: 36.6 kg  Current TF: Nepro Carb Steady @ 45 ml/hr  Medications reviewed and include: aranesp weekly, pepcid, SSI q 4 hours, semglee 5 units daily, renavit, miralax, thiamine, IV abx  Labs reviewed: WBC 13.9, hemoglobin 6.4, HCT 20.0 CBG's: 133-205 x 24 hours  Diet Order:   Diet Order             Diet NPO time specified  Diet effective midnight                   EDUCATION NEEDS:   No education needs have been identified at this time  Skin:  Skin Assessment: Reviewed RN Assessment  Last BM:  03/24/23  Height:   Ht Readings from Last 1 Encounters:  03/11/23 5\' 2"  (1.575 m)    Weight:   Wt Readings from Last 1 Encounters:  03/24/23 36.6 kg    Ideal Body Weight:  50 kg  BMI:  Body mass index is 14.76 kg/m.  Estimated Nutritional Needs:   Kcal:  1600-1800  Protein:  75-90g/d  Fluid:  1L + UOP    Mertie Clause, MS, RD, LDN Inpatient Clinical Dietitian Please see AMiON for contact information.

## 2023-03-24 NOTE — TOC Progression Note (Signed)
Transition of Care (TOC) - Progression Note  Donn Pierini RN,BSN Transitions of Care Unit 4NP (Non Trauma)- RN Case Manager See Treatment Team for direct Phone #   Patient Details  Name: Susan Evans MRN: [id number] Date of Birth: January 21, 1956  Transition of Care [facility]) CM/SW Contact  Zenda Alpers Lenn Sink, RN Phone Number: 03/24/2023, 4:36 PM  Clinical Narrative:    Follow up done with patient and family at the bedside regarding decision about LTACH vs INPT rehab.  Family voiced they had just finished tour with [facility] and have decided to move forward with [facility] and [facility] as first choice.   [facility] Liaison has confirmed that they will start insurance auth for potential admit if insurance approves and bed available.   TOC will continue to follow. Attending team updated.    Expected Discharge Plan: Long Term Acute Care (LTAC) Barriers to Discharge: Continued Medical Work up  Expected Discharge Plan and Services   Discharge Planning Services: CM Consult Post Acute Care Choice: IP Rehab, Long Term Acute Care (LTAC) Living arrangements for the past 2 months: Apartment                                       Social Determinants of Health (SDOH) Interventions SDOH Screenings   Food Insecurity: No Food Insecurity (03/13/2022)  Housing: Low Risk  (03/13/2022)  Transportation Needs: No Transportation Needs (03/13/2022)  Alcohol Screen: Low Risk  (03/13/2022)  Depression (PHQ2-9): Medium Risk (03/13/2022)  Financial Resource Strain: Low Risk  (03/13/2022)  Physical Activity: Insufficiently Active (03/13/2022)  Social Connections: Socially Integrated (03/13/2022)  Stress: No Stress Concern Present (03/13/2022)  Tobacco Use: Medium Risk (03/23/2023)    Readmission Risk Interventions     No data to display

## 2023-03-24 NOTE — Progress Notes (Signed)
[facility]OLOG[facility] PROGRESS NOTE  Assessment/ Plan: [name]   OP HD: TTS [facility]     4h  350/500   36kg  3K/2.5Ca bath  RUA AVF   -Heparin  2000 units initial bolus + 2000 units IV midrun-Decrease dose on discharge-too high for weight.   - last OP HD 6/18, post wt 38.4  - hectorol 2 mcg IV three times per week  - coming off at dry wt most of last 2 wks  # Angioedema: occurred 12/2021 as well.  Both times this has happened have been in the setting of infection but that's the only common link, not sure that it was the same organism anyway.  CT maxillofacial showed soft tissue swelling/ cellulitis w/o abscess.  No sign of AVF infection on exam.  Pt required nasal intubation by ENT. Pt rec'd a course of decadron and pepcid. Pt is already dialyzing on a hypoallergenic dialyzer at OP dialysis for the past year. Will keep this on indefinitely.    #Acute resp failure - was intubated nasally by ENT initially. F/u video laryngoscope done by CCM showed improved swelling of the cords. Pt was extubated successfully.   # Hyperkalemia: Managed with dialysis.  #ESRD - TTS HD.  Plan for HD tomorrow.  #S agalactiae bacteremia: as above in #1, on cefazolin.  Per pharm and ID she will need IV Ancef 2gm post HD TTS through 7/31. VVS consulted to assess her AVF, they found no signs of infection. Appreciate assistance.   # HTN/ vol: Monitor BP, UF as tolerated.  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 Started ESA 03/22/2023. Iron stores OK.  Recommend to transfuse HGB <7.0.   # Metabolic Bone Disease: Phosphorus acceptable, off of binders.    Subjective: Seen and examined at bedside.  Denies nausea, vomiting, chest pain, shortness of breath.  Patient states she did not feel well last time when UF was high.  Per patient her UF is around 2.5 kg. Objective Vital signs in last 24 hours: Vitals:   03/24/23 0803 03/24/23 0949 03/24/23 1005 03/24/23 1100  BP: (!) 103/53 (!) 103/40 119/60 (!)  134/46  Pulse: 91 89 91 98  Resp: 15 17 17 13   Temp: 97.9 F (36.6 C) 98.2 F (36.8 C) 97.8 F (36.6 C) 97.8 F (36.6 C)  TempSrc: Oral Oral Oral Oral  SpO2: 97% 97% 96% 98%  Weight:      Height:       Weight change: -0.2 kg  Intake/Output Summary (Last 24 hours) at 03/24/2023 1237 Last data filed at 03/24/2023 0900 Gross per 24 hour  Intake 0 ml  Output --  Net 0 ml       Labs: RENAL PANEL Recent Labs  Lab 03/20/23 0834 03/20/23 1550 03/21/23 0429 03/21/23 1233 03/22/23 0316 03/23/23 0253 03/24/23 0404  NA 132*  --  132* 133* 134* 134* 135  K 4.8   < > 5.8* 5.2* 4.2 4.2 4.4  CL 92*  --  92* 91* 91* 94* 91*  CO2 28  --  30 31 33* 25 28  GLUCOSE 248*  --  272* 174* 121* 200* 207*  BUN 26*  --  45* 54* 71* 46* 83*  CREATININE 2.39*  --  3.43* 3.68* 4.12* 2.73* 4.04*  CALCIUM 8.5*  --  8.6* 8.9 8.9 8.3* 8.7*  PHOS 2.5  --  2.8  --  3.6 2.9 3.8  ALBUMIN 2.7*  --  2.5*  --  2.3* 2.3* 2.5*   < > = values in this interval not displayed.    Liver Function Tests: Recent Labs  Lab 03/22/23 0316 03/23/23 0253 03/24/23 0404  ALBUMIN 2.3* 2.3* 2.5*   No results for input(s): "LIPASE", "AMYLASE" in the last 168 hours. No results for input(s): "AMMONIA" in the last 168 hours. CBC: Recent Labs    03/20/23 0834 03/21/23 0429 03/22/23 0316 03/23/23 0253 03/24/23 0404  HGB 10.4* 9.2* 7.9* 7.3* 6.4*  MCV 79.9* 78.2* 77.2* 77.2* 78.7*  FERRITIN  --   --   --  6,385*  --   TIBC  --   --   --  168*  --   IRON  --   --   --  155  --     Cardiac Enzymes: No results for input(s): "CKTOTAL", "CKMB", "CKMBINDEX", "TROPONINI" in the last 168 hours. CBG: Recent Labs  Lab 03/23/23 1947 03/23/23 2323 03/24/23 0333 03/24/23 0739 03/24/23 1150  GLUCAP 133* 172* 205* 194* 211*    Iron Studies:  Recent Labs    03/23/23 0253  IRON 155  TIBC 168*  FERRITIN 6,385*   Studies/Results: No results found.  Medications: Infusions:  sodium chloride Stopped (03/13/23  1623)    ceFAZolin (ANCEF) IV 2 g (03/22/23 1740)   feeding supplement (NEPRO CARB STEADY) 45 mL/hr at 03/22/23 1500    Scheduled Medications:  aspirin  81 mg Per Tube Daily   atorvastatin  80 mg Per Tube Daily   darbepoetin (ARANESP) injection - DIALYSIS  40 mcg Subcutaneous Q Sat-1800   famotidine  10 mg Per Tube Daily   heparin  5,000 Units Subcutaneous Q12H   insulin aspart  0-15 Units Subcutaneous Q4H   insulin glargine-yfgn  5 Units Subcutaneous QHS   loratadine  10 mg Per Tube Daily   multivitamin  1 tablet Per Tube QHS   polyethylene glycol  17 g Per Tube Daily   thiamine  100 mg Per Tube Daily    have reviewed scheduled and prn medications.  Physical Exam: General:NAD, comfortable, NG tube in place. Heart:RRR, s1s2 nl Lungs:clear b/l, no crackle Abdomen:soft, Non-tender, non-distended Extremities:No edema Dialysis Access: Right upper extremity AV fistula has good thrill and bruit.  Kathaleya Mcduffee Elpidio Anis Karlena Luebke 03/24/2023,12:37 PM  LOS: 13 days

## 2023-03-24 NOTE — Progress Notes (Signed)
Subjective:  Hemoglobin was 6.4 overnight, patient required 1 unit of packed red blood cells.  She is able to swallow but has some difficulty with it.  We discussed removing the NG tube or perhaps switching to a PEG.  Patient reports some difficulty with swallowing as well as some swelling and discomfort in the left shoulder.   Objective:  Vital signs in last 24 hours: Vitals:   03/24/23 0803 03/24/23 0949 03/24/23 1005 03/24/23 1100  BP: (!) 103/53 (!) 103/40 119/60 (!) 134/46  Pulse: 91 89 91 98  Resp: 15 17 17 13   Temp: 97.9 F (36.6 C) 98.2 F (36.8 C) 97.8 F (36.6 C) 97.8 F (36.6 C)  TempSrc: Oral Oral Oral Oral  SpO2: 97% 97% 96% 98%  Weight:      Height:       Labs:  CBC: Hemoglobin 6.4 overnight, transfused.  White blood cells slightly elevated at 13.9  BMP: Glucose 207, BUN 83, creatinine 4.04, expected given ESRD.  Physical Exam: Constitutional: Patient is sitting upright in bed, appears thin Neck: Firm neck, likely secondary to prior radiation treatment and edema Cardio: Regular rate and rhythm, no murmurs.  Some collateral vessels in the chest. Pulm: Lungs clear to auscultation bilaterally Abdomen: Abdomen soft, nontender, nondistended Skin: Dry, but no lesions present Neuro: Alert and oriented, no focal neurological deficit Psych: Normal mood and affect  Assessment/Plan:  Principal Problem:   Bacteremia due to group B Streptococcus Active Problems:   Type 2 diabetes mellitus with complication (HCC)   Essential hypertension   ESRD (end stage renal disease) (HCC)   Acute hypoxic respiratory failure (HCC)   Microcytic anemia   Malignant neoplasm of pharynx, unspecified (HCC)   Protein-calorie malnutrition, severe   Dysphagia, pharyngoesophageal phase   Patient Summary: Susan Evans is a 67 year old female with a past medical history of hypertension, diabetes, ESRD on dialysis TTS, who presented with angioedema edema that required intubation.   She was subsequently found to have group B strep bacteremia.   Group B strep bacteremia Patient has remained afebrile without signs of worsening infection.  Blood cultures from 6/19 showed no growth.  -Continue IV cefazolin until 04/23/23 -Trend CBC, monitor fever curve and white count  Angioedema History of head and neck cancer, s/p radiation therapy Dysphagia No concerns for active airway edema or respiratory symptoms. Patient still has an NG tube in and reports some difficulty swallowing.  Talked with patient about the possibility of permanent feeding tube placement versus removing the NG tube eventually.  Patient is receptive to the idea of an LTACH.  -Speech following -Continue tube feeds, n.p.o. -Ongoing conversations about placement with social work team  ESRD on HD TTS  -Continue dialysis during inpatient stay per nephrology -Trend BMP  Hypertension Held hydralazine and carvedilol for soft blood pressure this morning.  Blood pressure continues to be low, so we will hold it for now. -Hold hydralazine and carvedilol  Type 2 diabetes mellitus Blood glucose ranged from 194-211 today.  If no improvement, consider increasing the dose. -CBG per unit protocol -Semglee 5 units daily at bedtime, remain on SSI.   GERD -Continue famotidine 20 mg daily  Carotid Stenosis -Continue aspirin 81 mg daily -Continue atorvastatin 80 mg daily  Diet: NPO, CorTrak w/ TF IVF: None VTE: heparin injection 5,000 Units Start: 03/16/23 2200 SCDs Start: 03/11/23 1504 Code: Full AB: Cefazolin DISPO: Anticipated discharge pending  dispo planning .  Annett Fabian, MD 03/24/2023, 1:56 PM Pager: [phone] After 5pm on  weekdays and 1pm on weekends: On Call pager [phone]

## 2023-03-24 NOTE — Progress Notes (Signed)
Date and time results received: 03/24/23 0520  Test: Hgb Critical Value: 6.4  Name of Provider Notified: Noorudin, MD   Orders Received? Or Actions Taken?:  See orders.

## 2023-03-24 NOTE — Progress Notes (Signed)
OT Cancellation Note  Patient Details Name: Susan Evans MRN: [id number] DOB: 03-01-1956   Cancelled Treatment:    Reason Eval/Treat Not Completed: Other (comment)-pt with SLP. Will follow and see as able.   Barry Brunner, OT Acute Rehabilitation Services Office [phone]   Chancy Milroy 03/24/2023, 1:54 PM

## 2023-03-24 NOTE — Progress Notes (Signed)
Occupational Therapy Treatment Patient Details Name: Susan Evans MRN: [id number] DOB: [birth year] Today's Date: 03/24/2023   History of present illness 67 yo female presents to [facility] on 6/18 with lip swelling with subglottic edema/ compromised airway, s/p fiberoptic intubation 6/18-6/24. Other dx includes bacteremia. Dysphagia with cortrak. 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   OT comments  Pt completed LB dressing with min guard assist (changed pants). Transferred to chair with min guard assist and set up for grooming. Pt declined bathing, prefers to do prior to bedtime. Educated in multiple benefits of rollator during ADLs and IADLs. VSS on RA.   Recommendations for follow up therapy are one component of a multi-disciplinary discharge planning process, led by the attending physician.  Recommendations may be updated based on patient status, additional functional criteria and insurance authorization.    Assistance Recommended at Discharge Intermittent Supervision/Assistance  Patient can return home with the following  A little help with walking and/or transfers;A little help with bathing/dressing/bathroom;Assistance with cooking/housework;Assist for transportation;Help with stairs or ramp for entrance   Equipment Recommendations  Tub/shower seat    Recommendations for Other Services      Precautions / Restrictions Precautions Precautions: Fall Precaution Comments: NPO, cortrak Restrictions Weight Bearing Restrictions: No       Mobility Bed Mobility Overal bed mobility: Needs Assistance Bed Mobility: Supine to Sit, Sit to Supine     Supine to sit: Supervision Sit to supine: Supervision   General bed mobility comments: supervision for lines    Transfers Overall transfer level: Needs assistance Equipment used: None Transfers: Sit to/from Stand, Bed to  chair/wheelchair/BSC Sit to Stand: Min guard     Step pivot transfers: Min guard           Balance Overall balance assessment: Needs assistance   Sitting balance-Leahy Scale: Good     Standing balance support: No upper extremity supported, During functional activity Standing balance-Leahy Scale: Fair Standing balance comment: min guard while managing pants                           ADL either performed or assessed with clinical judgement   ADL Overall ADL's : Needs assistance/impaired Grooming: set up, sitting                     Lower Body Dressing: Min guard;Sit to/from stand Lower Body Dressing Details (indicate cue type and reason): changed from pajama pants to shorts at pt's request             Functional mobility during ADLs: Min guard      Extremity/Trunk Assessment              Vision       Perception     Praxis      Cognition Arousal/Alertness: Awake/alert Behavior During Therapy: Flat affect Overall Cognitive Status: Within Functional Limits for tasks assessed                                          Exercises      Shoulder Instructions       General Comments      Pertinent Vitals/ Pain       Pain Assessment Pain Assessment: No/denies pain  Home Living  Prior Functioning/Environment              Frequency  Min 2X/week        Progress Toward Goals  OT Goals(current goals can now be found in the care plan section)  Progress towards OT goals: Progressing toward goals  Acute Rehab OT Goals OT Goal Formulation: With patient Time For Goal Achievement: 04/03/23 Potential to Achieve Goals: Good  Plan Discharge plan remains appropriate    Co-evaluation                 AM-PAC OT "6 Clicks" Daily Activity     Outcome Measure   Help from another person eating meals?: Total Help from another person taking care of  personal grooming?: A Little Help from another person toileting, which includes using toliet, bedpan, or urinal?: A Little Help from another person bathing (including washing, rinsing, drying)?: A Little Help from another person to put on and taking off regular upper body clothing?: A Little Help from another person to put on and taking off regular lower body clothing?: A Little 6 Click Score: 16    End of Session    OT Visit Diagnosis: Unsteadiness on feet (R26.81);Muscle weakness (generalized) (M62.81)   Activity Tolerance Patient tolerated treatment well   Patient Left in chair;with call bell/phone within reach;with chair alarm set   Nurse Communication          Time: 1610-9604 OT Time Calculation (min): 20 min  Charges: OT General Charges $OT Visit: 1 Visit OT Treatments $Self Care/Home Management : 8-22 mins  Susan Evans, OTR/L Acute Rehabilitation Services Office: [phone]   Susan Evans 03/24/2023, 3:30 PM

## 2023-03-24 NOTE — Plan of Care (Signed)

## 2023-03-24 NOTE — Progress Notes (Signed)
Physical Therapy Treatment Patient Details Name: Susan Evans MRN: [id number] DOB: Jan 25, 1956 Today's Date: 03/24/2023   History of Present Illness 67 yo female presents to [facility] on 6/18 with lip swelling with subglottic edema/ compromised airway, s/p fiberoptic intubation 6/18-6/24. Other dx includes bacteremia. Dysphagia with cortrak. 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    PT Comments  Patient inquiring about using a rollator as she feels much weaker than when she was admitted to hospital. She wants the security of having a seat always with her in case she needs to sit and rest. Educated pt on use of basic rollator with pt requiring cues for sequencing and safe use. Able to ambulate on RA with sats >90%.     Assistance Recommended at Discharge Intermittent Supervision/Assistance  If plan is discharge home, recommend the following:  Can travel by private vehicle    A little help with walking and/or transfers;A little help with bathing/dressing/bathroom;Assistance with cooking/housework;Assist for transportation;Help with stairs or ramp for entrance      Equipment Recommendations  Rollator (4 wheels)    Recommendations for Other Services       Precautions / Restrictions Precautions Precautions: Fall Precaution Comments: NPO, cortrak Restrictions Weight Bearing Restrictions: No     Mobility  Bed Mobility Overal bed mobility: Needs Assistance Bed Mobility: Supine to Sit, Sit to Supine     Supine to sit: Supervision, HOB elevated Sit to supine: Supervision   General bed mobility comments: assist with lines/leads    Transfers Overall transfer level: Needs assistance Equipment used: Rollator (4 wheels) Transfers: Sit to/from Stand Sit to Stand: Min guard           General transfer comment: close guard for safety, no imbalance noted; when sitting on rollator  required cues for sequencing    Ambulation/Gait Ambulation/Gait assistance: Min guard Gait Distance (Feet): 100 Feet (seated rest on rollator; 50) Assistive device: Rollator (4 wheels) Gait Pattern/deviations: Step-through pattern, Decreased stride length, Trunk flexed Gait velocity: decr     General Gait Details: vc for proximity to rollator and upright posture; on RA with sats >09%   Stairs             Wheelchair Mobility     Tilt Bed    Modified Rankin (Stroke Patients Only)       Balance Overall balance assessment: Needs assistance Sitting-balance support: No upper extremity supported, Feet supported Sitting balance-Leahy Scale: Fair     Standing balance support: No upper extremity supported, During functional activity Standing balance-Leahy Scale: Fair                              Cognition Arousal/Alertness: Awake/alert Behavior During Therapy: WFL for tasks assessed/performed Overall Cognitive Status: Within Functional Limits for tasks assessed                                          Exercises      General Comments        Pertinent Vitals/Pain Pain Assessment Pain Assessment: No/denies pain Faces Pain Scale: No hurt    Home Living                          Prior Function  PT Goals (current goals can now be found in the care plan section) Acute Rehab PT Goals Patient Stated Goal: home Time For Goal Achievement: 04/01/23 Potential to Achieve Goals: Good Progress towards PT goals: Progressing toward goals    Frequency    Min 2X/week      PT Plan Current plan remains appropriate    Co-evaluation              AM-PAC PT "6 Clicks" Mobility   Outcome Measure  Help needed turning from your back to your side while in a flat bed without using bedrails?: None Help needed moving from lying on your back to sitting on the side of a flat bed without using bedrails?: A Little Help  needed moving to and from a bed to a chair (including a wheelchair)?: A Little Help needed standing up from a chair using your arms (e.g., wheelchair or bedside chair)?: A Little Help needed to walk in hospital room?: A Little Help needed climbing 3-5 steps with a railing? : A Lot 6 Click Score: 18    End of Session Equipment Utilized During Treatment: Gait belt Activity Tolerance: Patient tolerated treatment well Patient left: with call bell/phone within reach;in bed;with family/visitor present Nurse Communication: Mobility status PT Visit Diagnosis: Other abnormalities of gait and mobility (R26.89);Muscle weakness (generalized) (M62.81)     Time: 1610-9604 PT Time Calculation (min) (ACUTE ONLY): 28 min  Charges:    $Gait Training: 23-37 mins PT General Charges $$ ACUTE PT VISIT: 1 Visit                      Jerolyn Center, PT Acute Rehabilitation Services  Office [phone]    Zena Amos 03/24/2023, 2:58 PM

## 2023-03-24 NOTE — Progress Notes (Signed)
Speech Language Pathology Treatment: Dysphagia  Patient Details Name: Susan Evans MRN: [id number] DOB: [birth year] Today's Date: 03/24/2023 Time: 1191-4782 SLP Time Calculation (min) (ACUTE ONLY): 36 min  Assessment / Plan / Recommendation Clinical Impression  Visited pt and brother to discuss dysphagia and plan of care. RN also observed and assisted in education family. Pt has been asking for Nepro and ice chips. Says she wasn't given a chance to really swallow anything or get enough in on her swallowing evaluations. Allowed pt to sip on a Nepro while also questioning pt and brother about her baseline. As she was sipping, pointed out signs of aspiration to both of them, which including coughing, throat clearing, hocking and watery eyes. Pt confirmed that this was consistent with her baseline and we discussed how dysphagia like hers can pose a risk of pneumonia and respiratory failure, but is also contributing with her weight loss and weakness. We talked about radiation induced dysphagia and the changes radiation can cause to tissue. Therapy at this point may help a little, but is not likely to make big improvement over 10 years after treatment.  We discussed how a feeding tube has its risks and benefits, but overall, without a feeding tube pt will likely continue to lose weight, become weaker and potentially struggle  with pneumonia. Pt had a PEG tube in the past and understands how it works. SLP showed RN and brother the MBS images to help convey risk. Pt and borther plan to discuss further with family and be ready with questions for MD when possible. SLP will follow chart and be ready to answer questions as needed. Pt may ultimately choose to eat and drink with known risk, but also get a feeding tube for improved nutrition. She appears to not be willing to restrict oral intake for comfort. More education about this will be needed.   HPI HPI: Susan Evans is a 67yo female who presented 6/18 with  lip swelling noted at dialysis where they attempted extra volume removal but she did not tolerate this r/t tachycardia and malaise so she was sent to ER where her lip swelling progressed. She also developed SOB and hoarse voice and ENT was consulted. She was ultimately taken to OR and nasally intubated by ENT. ETT 6/18-24. 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      SLP Plan  Continue with current plan of care      Recommendations for follow up therapy are one component of a multi-disciplinary discharge planning process, led by the attending physician.  Recommendations may be updated based on patient status, additional functional criteria and insurance authorization.    Recommendations  Diet recommendations: Other(comment) (ice chips)                  Oral care QID     Dysphagia, oropharyngeal phase (R13.12)     Continue with current plan of care     Yeray Tomas, Riley Nearing  03/24/2023, 2:03 PM

## 2023-03-24 DEATH — deceased

## 2023-03-25 ENCOUNTER — Inpatient Hospital Stay (HOSPITAL_COMMUNITY): Payer: 59

## 2023-03-25 DIAGNOSIS — E1122 Type 2 diabetes mellitus with diabetic chronic kidney disease: Secondary | ICD-10-CM | POA: Diagnosis not present

## 2023-03-25 DIAGNOSIS — B951 Streptococcus, group B, as the cause of diseases classified elsewhere: Secondary | ICD-10-CM | POA: Diagnosis not present

## 2023-03-25 DIAGNOSIS — I120 Hypertensive chronic kidney disease with stage 5 chronic kidney disease or end stage renal disease: Secondary | ICD-10-CM | POA: Diagnosis not present

## 2023-03-25 DIAGNOSIS — R7881 Bacteremia: Secondary | ICD-10-CM | POA: Diagnosis not present

## 2023-03-25 DIAGNOSIS — R1310 Dysphagia, unspecified: Secondary | ICD-10-CM

## 2023-03-25 LAB — CBC
HCT: 25.7 % — ABNORMAL LOW (ref 36.0–46.0)
Hemoglobin: 8.3 g/dL — ABNORMAL LOW (ref 12.0–15.0)
MCH: 26.1 pg (ref 26.0–34.0)
MCHC: 32.3 g/dL (ref 30.0–36.0)
MCV: 80.8 fL (ref 80.0–100.0)
Platelets: 221 10*3/uL (ref 150–400)
RBC: 3.18 MIL/uL — ABNORMAL LOW (ref 3.87–5.11)
RDW: 15.6 % — ABNORMAL HIGH (ref 11.5–15.5)
WBC: 17.8 10*3/uL — ABNORMAL HIGH (ref 4.0–10.5)
nRBC: 0 % (ref 0.0–0.2)

## 2023-03-25 LAB — TYPE AND SCREEN
ABO/RH(D): A POS
Antibody Screen: NEGATIVE
Unit division: 0

## 2023-03-25 LAB — RENAL FUNCTION PANEL
Albumin: 2.7 g/dL — ABNORMAL LOW (ref 3.5–5.0)
Anion gap: 14 (ref 5–15)
BUN: 110 mg/dL — ABNORMAL HIGH (ref 8–23)
CO2: 30 mmol/L (ref 22–32)
Calcium: 8.9 mg/dL (ref 8.9–10.3)
Chloride: 90 mmol/L — ABNORMAL LOW (ref 98–111)
Creatinine, Ser: 5.08 mg/dL — ABNORMAL HIGH (ref 0.44–1.00)
GFR, Estimated: 9 mL/min — ABNORMAL LOW (ref >60)
Glucose, Bld: 241 mg/dL — ABNORMAL HIGH (ref 70–99)
Phosphorus: 3.2 mg/dL (ref 2.5–4.6)
Potassium: 4.4 mmol/L (ref 3.5–5.1)
Sodium: 134 mmol/L — ABNORMAL LOW (ref 135–145)

## 2023-03-25 LAB — GLUCOSE, CAPILLARY
Glucose-Capillary: 185 mg/dL — ABNORMAL HIGH (ref 70–99)
Glucose-Capillary: 185 mg/dL — ABNORMAL HIGH (ref 70–99)
Glucose-Capillary: 193 mg/dL — ABNORMAL HIGH (ref 70–99)
Glucose-Capillary: 214 mg/dL — ABNORMAL HIGH (ref 70–99)
Glucose-Capillary: 225 mg/dL — ABNORMAL HIGH (ref 70–99)
Glucose-Capillary: 254 mg/dL — ABNORMAL HIGH (ref 70–99)

## 2023-03-25 LAB — BPAM RBC
ISSUE DATE / TIME: 202407010937
Unit Type and Rh: 6200

## 2023-03-25 MED ORDER — ORAL CARE MOUTH RINSE
15.0000 mL | OROMUCOSAL | Status: DC
  Administered 2023-03-25 – 2023-03-31 (×17): 15 mL via OROMUCOSAL

## 2023-03-25 MED ORDER — LIDOCAINE-PRILOCAINE 2.5-2.5 % EX CREA: 1.0000 | TOPICAL_CREAM | CUTANEOUS | Status: DC | PRN

## 2023-03-25 MED ORDER — PENTAFLUOROPROP-TETRAFLUOROETH EX AERO: 1.0000 | INHALATION_SPRAY | CUTANEOUS | Status: DC | PRN

## 2023-03-25 MED ORDER — LIDOCAINE HCL (PF) 1 % IJ SOLN: 5.0000 mL | INTRAMUSCULAR | Status: DC | PRN

## 2023-03-25 NOTE — Procedures (Signed)
HD Note:  Some information was entered later than the data was gathered due to patient care needs. The stated time with the data is accurate.   Received patient in bed to unit.  Alert and oriented.  Informed consent signed and in chart.   TX duration: 3.5 hours  Patient BP dropped suddenly.  UF turned off and 100 ml of saline given.  See flowsheet.  Patient stated that she did not want more than 1000 ml pulled from her today.  Goal was adjusted  Patient ended treatment because she stated she felt ill, nauseated.  Ozzie Hoyle, PA came and spoke with the patient.  The patient insisted that she was done..  AMA form signed.    Transported back to the room  Alert and stated she only felt a little bit bette. Hand-off given to patient's nurse.   Access used: right upper arm fistula Access issues: None  Total UF removed: 400 ml    [facility]

## 2023-03-25 NOTE — Progress Notes (Signed)
Subjective: No overnight events.  Patient was in dialysis at time of exam.  She states she was feeling okay this morning with no new concerns.  We discussed her continued difficulty with swallowing and the possibility of putting in a PEG tube.  She said she had discussed this with her family and they were leaning towards going forward but she wanted to talk with them some more first.   Objective:  Vital signs in last 24 hours: Vitals:   03/25/23 1000 03/25/23 1030 03/25/23 1037 03/25/23 1148  BP: (!) 102/41 110/76 112/66 (!) 105/45  Pulse: 99 100 95 100  Resp: 19 (!) 22 18 20   Temp:    97.7 F (36.5 C)  TempSrc:    Oral  SpO2: 97% 97% 93% 100%  Weight:      Height:        Physical Exam: Constitutional: Alert and oriented, sitting in dialysis bed Cardio: Regular rate and rhythm, no murmurs Pulm: Clear to auscultation bilaterally Abdomen: Flat and soft Skin: No skin lesions or abnormalities noted Neuro: Alert and oriented; no new focal deficits Psych: Normal mood and affect  Labs:  CBC: Hemoglobin 8.3, hematocrit 25.7. Stable.  White blood cells elevated at 17.8, up from 30.9 yesterday.  BMP: BUN and creatinine significantly elevated as expected in the setting of ESRD.  Albumin 2.7.  Glucose 241.  Otherwise unremarkable.  Assessment/Plan:  Principal Problem:   Bacteremia due to group B Streptococcus Active Problems:   Type 2 diabetes mellitus with complication (HCC)   Essential hypertension   ESRD (end stage renal disease) (HCC)   Acute hypoxic respiratory failure (HCC)   Microcytic anemia   Malignant neoplasm of pharynx, unspecified (HCC)   Protein-calorie malnutrition, severe   Dysphagia, pharyngoesophageal phase   Patient Summary: Susan Evans is a 67 year old female with a past medical history of hypertension, diabetes, ESRD on dialysis TTS, who presented with angioedema edema that required intubation.  She was subsequently found to have group B strep  bacteremia, which is being treated with IV cefazolin.  She continues to experience dysphagia with evidence of aspiration on swallow studies and has been receiving her nutrition through an NG tube.  We have been discussing with the patient and her family about the likelihood of needing a PEG tube for adequate nutrition.  Group B strep bacteremia Patient continues to be afebrile with no clinical signs of systemic infection.  -Continue IV cefazolin until 04/23/2023 -CBC AM  Angioedema History of head and neck cancer, s/p radiation therapy Dysphagia No acute concerns of respiratory symptoms or airway edema today.  She continues to receive nutrition through an NG tube.  The speech team evaluated her yesterday and noted continued difficulty with swallowing, aspiration, and discussed with the patient and her family the possibility of needing a feeding tube.  The family has identified an LTACH for placement. -Speech following -Interventional radiology consult regarding PEG tube placement  ESRD on HD TTS  Per nursing, patient did not complete dialysis this morning due to feeling ill and requesting ice chips which they were unable to give her.  We modified the order to allow ice chips. -Trend BMP  Hypertension Blood pressure continues to be soft this morning, so we will hold her hydralazine and carvedilol for now.  Type 2 diabetes mellitus  -CBG per unit protocol -Semglee 5 units at bedtime, remain on SSI  GERD -Continue famotidine 20 mg daily   Carotid Stenosis -Continue aspirin 81 mg daily -Continue atorvastatin 80 mg  daily  Diet: NPO (ice chip exception), CorTrak w/ TF IVF: None VTE: heparin injection 5,000 units Code: Full AB: Cefazolin Family Update: Spoke with daughter Susan Evans about the plan for the PEG tube   Dispo: Anticipated discharge to  Home vs SNF  pending medical stability and safe dispo plan.   Annett Fabian, MD 03/25/2023, 1:40 PM Pager: [phone] After 5pm on  weekdays and 1pm on weekends: On Call pager [phone]

## 2023-03-25 NOTE — Progress Notes (Signed)
Pt has been educated multiple times by many different health care professionals regarding the risk of aspiration and subsequent pneumonia if she continues to take in food and fluids PO instead of via the NG tube. Pt continues to insist on nutritional supplement drinks and ice chips PO.

## 2023-03-25 NOTE — Procedures (Signed)
Patient was seen on dialysis and the procedure was supervised.  BFR 400  Via AVF BP is  102/41. UF goal reduced to 2 L and lower dialysate temp to 36 for IDH.   Patient appears to be tolerating treatment well. D/w HD nurse.  Susan Evans Susan Evans 03/25/2023

## 2023-03-25 NOTE — Progress Notes (Signed)
Physical Therapy Treatment Patient Details Name: Susan Evans MRN: [id number] DOB: 17-Feb-1956 Today's Date: 03/25/2023   History of Present Illness 67 yo female presents to [facility] on 6/18 with lip swelling with subglottic edema/ compromised airway, s/p fiberoptic intubation 6/18-6/24. Other dx includes bacteremia. Dysphagia with cortrak. 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    PT Comments  Patient again eager to work with PT. Educated further on use of rollator (hand placement, locking/unlocking, not parking it off to the side). Session ended due to transporter arrived to take pt to CT.      Assistance Recommended at Discharge Intermittent Supervision/Assistance  If plan is discharge home, recommend the following:  Can travel by private vehicle    A little help with walking and/or transfers;A little help with bathing/dressing/bathroom;Assistance with cooking/housework;Assist for transportation;Help with stairs or ramp for entrance      Equipment Recommendations  Rollator (4 wheels)    Recommendations for Other Services       Precautions / Restrictions Precautions Precautions: Fall Precaution Comments: NPO, cortrak Restrictions Weight Bearing Restrictions: No     Mobility  Bed Mobility Overal bed mobility: Needs Assistance Bed Mobility: Supine to Sit, Sit to Supine     Supine to sit: HOB elevated, Modified independent (Device/Increase time) Sit to supine: Modified independent (Device/Increase time)   General bed mobility comments: assist with lines/leads only    Transfers Overall transfer level: Needs assistance Equipment used: Rollator (4 wheels) Transfers: Sit to/from Stand Sit to Stand: Min guard           General transfer comment: close guard for safety, no imbalance noted; cues for sequencing    Ambulation/Gait Ambulation/Gait assistance: Min  guard Gait Distance (Feet): 150 Feet Assistive device: Rollator (4 wheels) Gait Pattern/deviations: Step-through pattern, Decreased stride length, Trunk flexed Gait velocity: decr     General Gait Details: vc for proximity to rollator and upright posture; on RA with sats >90%   Stairs             Wheelchair Mobility     Tilt Bed    Modified Rankin (Stroke Patients Only)       Balance Overall balance assessment: Needs assistance Sitting-balance support: No upper extremity supported, Feet supported Sitting balance-Leahy Scale: Fair     Standing balance support: No upper extremity supported, During functional activity Standing balance-Leahy Scale: Fair                              Cognition Arousal/Alertness: Awake/alert Behavior During Therapy: WFL for tasks assessed/performed Overall Cognitive Status: Within Functional Limits for tasks assessed                                          Exercises      General Comments        Pertinent Vitals/Pain Pain Assessment Pain Assessment: No/denies pain Faces Pain Scale: No hurt    Home Living                          Prior Function            PT Goals (current goals can now be found in the care plan section) Acute Rehab PT Goals Patient  Stated Goal: home Time For Goal Achievement: 04/01/23 Potential to Achieve Goals: Good Progress towards PT goals: Progressing toward goals    Frequency    Min 2X/week      PT Plan Current plan remains appropriate    Co-evaluation              AM-PAC PT "6 Clicks" Mobility   Outcome Measure  Help needed turning from your back to your side while in a flat bed without using bedrails?: None Help needed moving from lying on your back to sitting on the side of a flat bed without using bedrails?: None Help needed moving to and from a bed to a chair (including a wheelchair)?: A Little Help needed standing up from a chair  using your arms (e.g., wheelchair or bedside chair)?: A Little Help needed to walk in hospital room?: A Little Help needed climbing 3-5 steps with a railing? : A Lot 6 Click Score: 19    End of Session Equipment Utilized During Treatment: Gait belt Activity Tolerance: Patient tolerated treatment well Patient left: with call bell/phone within reach;in bed;Other (comment) (transporter) Nurse Communication: Mobility status PT Visit Diagnosis: Other abnormalities of gait and mobility (R26.89);Muscle weakness (generalized) (M62.81)     Time: 1610-9604 PT Time Calculation (min) (ACUTE ONLY): 23 min  Charges:    $Gait Training: 23-37 mins PT General Charges $$ ACUTE PT VISIT: 1 Visit                      Jerolyn Center, PT Acute Rehabilitation Services  Office [phone]    Zena Amos 03/25/2023, 4:19 PM

## 2023-03-26 DIAGNOSIS — E1122 Type 2 diabetes mellitus with diabetic chronic kidney disease: Secondary | ICD-10-CM | POA: Diagnosis not present

## 2023-03-26 DIAGNOSIS — R7881 Bacteremia: Secondary | ICD-10-CM | POA: Diagnosis not present

## 2023-03-26 DIAGNOSIS — B951 Streptococcus, group B, as the cause of diseases classified elsewhere: Secondary | ICD-10-CM | POA: Diagnosis not present

## 2023-03-26 DIAGNOSIS — I120 Hypertensive chronic kidney disease with stage 5 chronic kidney disease or end stage renal disease: Secondary | ICD-10-CM | POA: Diagnosis not present

## 2023-03-26 LAB — CBC
HCT: 21.9 % — ABNORMAL LOW (ref 36.0–46.0)
Hemoglobin: 7 g/dL — ABNORMAL LOW (ref 12.0–15.0)
MCH: 26.2 pg (ref 26.0–34.0)
MCHC: 32 g/dL (ref 30.0–36.0)
MCV: 82 fL (ref 80.0–100.0)
Platelets: 207 10*3/uL (ref 150–400)
RBC: 2.67 MIL/uL — ABNORMAL LOW (ref 3.87–5.11)
RDW: 16.4 % — ABNORMAL HIGH (ref 11.5–15.5)
WBC: 13.3 10*3/uL — ABNORMAL HIGH (ref 4.0–10.5)
nRBC: 0.2 % (ref 0.0–0.2)

## 2023-03-26 LAB — GLUCOSE, CAPILLARY
Glucose-Capillary: 200 mg/dL — ABNORMAL HIGH (ref 70–99)
Glucose-Capillary: 190 mg/dL — ABNORMAL HIGH (ref 70–99)
Glucose-Capillary: 349 mg/dL — ABNORMAL HIGH (ref 70–99)
Glucose-Capillary: 178 mg/dL — ABNORMAL HIGH (ref 70–99)
Glucose-Capillary: 215 mg/dL — ABNORMAL HIGH (ref 70–99)
Glucose-Capillary: 187 mg/dL — ABNORMAL HIGH (ref 70–99)

## 2023-03-26 LAB — RENAL FUNCTION PANEL
Albumin: 2.4 g/dL — ABNORMAL LOW (ref 3.5–5.0)
Anion gap: 11 (ref 5–15)
BUN: 84 mg/dL — ABNORMAL HIGH (ref 8–23)
CO2: 30 mmol/L (ref 22–32)
Calcium: 8.3 mg/dL — ABNORMAL LOW (ref 8.9–10.3)
Chloride: 91 mmol/L — ABNORMAL LOW (ref 98–111)
Creatinine, Ser: 3.91 mg/dL — ABNORMAL HIGH (ref 0.44–1.00)
GFR, Estimated: 12 mL/min — ABNORMAL LOW (ref >60)
Glucose, Bld: 184 mg/dL — ABNORMAL HIGH (ref 70–99)
Phosphorus: 2.5 mg/dL (ref 2.5–4.6)
Potassium: 4.1 mmol/L (ref 3.5–5.1)
Sodium: 132 mmol/L — ABNORMAL LOW (ref 135–145)

## 2023-03-26 MED ORDER — INSULIN GLARGINE-YFGN 100 UNIT/ML ~~LOC~~ SOLN
15.0000 [IU] | Freq: Every day | SUBCUTANEOUS | Status: DC
  Administered 2023-03-26 – 2023-03-28 (×3): 15 [IU] via SUBCUTANEOUS
  Filled 2023-03-26 (×4): qty 0.15

## 2023-03-26 MED ORDER — DARBEPOETIN ALFA 100 MCG/0.5ML IJ SOSY
100.0000 ug | PREFILLED_SYRINGE | INTRAMUSCULAR | Status: DC
  Administered 2023-03-29: 100 ug via SUBCUTANEOUS
  Filled 2023-03-26: qty 0.5

## 2023-03-26 NOTE — Inpatient Diabetes Management (Signed)
Inpatient Diabetes Program Recommendations  AACE/ADA: New Consensus Statement on Inpatient Glycemic Control (2015)  Target Ranges:  Prepandial:   less than 140 mg/dL      Peak postprandial:   less than 180 mg/dL (1-2 hours)      Critically ill patients:  140 - 180 mg/dL   Lab Results  Component Value Date   GLUCAP 349 (H) 03/26/2023   HGBA1C 9.0 (A) 11/20/2022    Review of Glycemic Control  Latest Reference Range & Units 03/25/23 07:32 03/25/23 11:40 03/25/23 16:38 03/25/23 19:37 03/25/23 23:32 03/26/23 03:03 03/26/23 07:31 03/26/23 11:17  Glucose-Capillary 70 - 99 mg/dL 409 (H) 811 (H) 914 (H) 254 (H) 185 (H) 200 (H) 190 (H) 349 (H)   Diabetes history: DM 2 Outpatient Diabetes medications: Lantus 30 units bid, Novolog 13 B/10 L/ 10 D  Current orders for Inpatient glycemic control:  Semglee 5 units  Novolog 0-15 units Q4 hours  Nepro 45 ml/hour  Inpatient Diabetes Program Recommendations:    -   consider starting Novolog Tube Feed Coverage 3 units Q4 hours (do not give if tube feeds are stopped or held)  Thanks,  Christena Deem RN, MSN, BC-ADM Inpatient Diabetes Coordinator Team Pager [phone] (8a-5p)

## 2023-03-26 NOTE — Progress Notes (Signed)
[facility] NEPHROLOGY PROGRESS NOTE  Assessment/ Plan: [name]   OP HD: TTS [facility]     4h  350/500   36kg  3K/2.5Ca bath  RUA AVF   -Heparin  2000 units initial bolus + 2000 units IV midrun-Decrease dose on discharge-too high for weight.   - last OP HD 6/18, post wt 38.4  - hectorol 2 mcg IV three times per week  - coming off at dry wt most of last 2 wks  # Angioedema: occurred 12/2021 as well.  Both times this has happened have been in the setting of infection but that's the only common link, not sure that it was the same organism anyway.  CT maxillofacial showed soft tissue swelling/ cellulitis w/o abscess.  No sign of AVF infection on exam.  Pt required nasal intubation by ENT. Pt rec'd a course of decadron and pepcid. Pt is already dialyzing on a hypoallergenic dialyzer at OP dialysis for the past year. Will keep this on indefinitely.    #Acute resp failure - was intubated nasally by ENT initially. F/u video laryngoscope done by CCM showed improved swelling of the cords. Pt was extubated successfully.   # Hyperkalemia: Managed with dialysis.  #ESRD - TTS HD.  Status post HD yesterday with only 400 cc UF.  Issue with the cramping.  Plan for regular HD tomorrow.  #S agalactiae bacteremia: as above in #1, on cefazolin.  Per pharm and ID she will need IV Ancef 2gm post HD TTS through 7/31. [facility] consulted to assess her AVF, they found no signs of infection. Appreciate assistance.   # HTN/ vol: Monitor BP, UF as tolerated.  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 Started ESA 03/22/2023. Iron stores OK.  Increase ESA dose.  Recommend to transfuse HGB <7.0.   # Metabolic Bone Disease: Phosphorus acceptable, off of binders.    # Dysphagia with history of head and neck cancer status postradiation therapy: Currently receiving feeding via NG tube.  Noted plan for possible PEG tube placement.  Subjective: Seen and examined.  Denies nausea, vomiting, chest pain or shortness of  breath.  Her granddaughter was present with the patient.  No new event.  Objective Vital signs in last 24 hours: Vitals:   03/25/23 1939 03/25/23 2333 03/26/23 0304 03/26/23 0735  BP: 123/73 (!) 119/57 116/60 (!) 100/55  Pulse: 90 92 94 90  Resp: 20 20 19 20   Temp: 98.2 F (36.8 C) 98 F (36.7 C) 98.1 F (36.7 C) (!) 97.5 F (36.4 C)  TempSrc: Oral Oral Oral Oral  SpO2: 95% 92% 94% 95%  Weight:   38.6 kg   Height:       Weight change: -0.5 kg  Intake/Output Summary (Last 24 hours) at 03/26/2023 0958 Last data filed at 03/25/2023 1037 Gross per 24 hour  Intake --  Output 500 ml  Net -500 ml        Labs: RENAL PANEL Recent Labs  Lab 03/22/23 0316 03/23/23 0253 03/24/23 0404 03/25/23 0440 03/26/23 0251  NA 134* 134* 135 134* 132*  K 4.2 4.2 4.4 4.4 4.1  CL 91* 94* 91* 90* 91*  CO2 33* 25 28 30 30   GLUCOSE 121* 200* 207* 241* 184*  BUN 71* 46* 83* 110* 84*  CREATININE 4.12* 2.73* 4.04* 5.08* 3.91*  CALCIUM 8.9 8.3* 8.7* 8.9 8.3*  PHOS 3.6 2.9 3.8 3.2 2.5  ALBUMIN 2.3* 2.3* 2.5* 2.7* 2.4*     Liver Function Tests:  Recent Labs  Lab 03/24/23 0404 03/25/23 0440 03/26/23 0251  ALBUMIN 2.5* 2.7* 2.4*    No results for input(s): "LIPASE", "AMYLASE" in the last 168 hours. No results for input(s): "AMMONIA" in the last 168 hours. CBC: Recent Labs    03/22/23 0316 03/23/23 0253 03/24/23 0404 03/25/23 0440 03/26/23 0251  HGB 7.9* 7.3* 6.4* 8.3* 7.0*  MCV 77.2* 77.2* 78.7* 80.8 82.0  FERRITIN  --  6,385*  --   --   --   TIBC  --  168*  --   --   --   IRON  --  155  --   --   --      Cardiac Enzymes: No results for input(s): "CKTOTAL", "CKMB", "CKMBINDEX", "TROPONINI" in the last 168 hours. CBG: Recent Labs  Lab 03/25/23 1638 03/25/23 1937 03/25/23 2332 03/26/23 0303 03/26/23 0731  GLUCAP 214* 254* 185* 200* 190*     Iron Studies:  No results for input(s): "IRON", "TIBC", "TRANSFERRIN", "FERRITIN" in the last 72 hours.  Studies/Results: CT  ABDOMEN WO CONTRAST  Result Date: 03/25/2023 CLINICAL DATA:  Assess anatomy for g-tube placement in IR EXAM: CT ABDOMEN WITHOUT CONTRAST TECHNIQUE: Multidetector CT imaging of the abdomen was performed following the standard protocol without IV contrast. RADIATION DOSE REDUCTION: This exam was performed according to the departmental dose-optimization program which includes automated exposure control, adjustment of the mA and/or kV according to patient size and/or use of iterative reconstruction technique. COMPARISON:  08/12/2019 FINDINGS: Lower chest: Diffuse coronary artery and aortic calcifications. No acute abnormality Hepatobiliary: Gallbladder is mildly distended. No visible intrahepatic or common bile duct dilatation. No focal hepatic abnormality. Pancreas: Calcifications throughout the pancreas compatible with chronic pancreatitis. Pancreatic duct mildly dilated in the pancreatic body measuring 7 mm. Spleen: No focal abnormality.  Normal size. Adrenals/Urinary Tract: Bilateral renal low-density lesions are difficult to characterize on this noncontrast study. 1.9 cm low-density lesion off the midpole of the left kidney. 2 cm low-density lesion off the lower pole of the right kidney. No stones or hydronephrosis. Adrenal glands unremarkable. Stomach/Bowel: Stomach is moderately distended with gas. Feeding tube tip is in the distal stomach. Stomach is noted along the anterior abdominal wall. Large amount of stool throughout the colon with moderate gaseous distension. The colon does not overlie/cover the stomach. No evidence of bowel obstruction. Vascular/Lymphatic: Diffuse aortoiliac atherosclerosis. No evidence of aneurysm or adenopathy. Other: No free fluid or free air. Musculoskeletal: No acute bony abnormality. IMPRESSION: Mild gaseous distention of the stomach which lies just under the anterior abdominal wall. No: Overlies or covers the stomach. Feeding tube tip in the distal stomach. Moderate stool burden  and gaseous distention of the colon. Changes of chronic pancreatitis. Coronary artery disease.  Aortoiliac atherosclerosis. Electronically Signed   By: Charlett Nose M.D.   On: 03/25/2023 19:38    Medications: Infusions:  sodium chloride Stopped (03/13/23 1623)    ceFAZolin (ANCEF) IV 2 g (03/25/23 1801)   feeding supplement (NEPRO CARB STEADY) 1,000 mL (03/25/23 2234)    Scheduled Medications:  atorvastatin  80 mg Per Tube Daily   darbepoetin (ARANESP) injection - DIALYSIS  40 mcg Subcutaneous Q Sat-1800   famotidine  10 mg Per Tube Daily   heparin  5,000 Units Subcutaneous Q12H   insulin aspart  0-15 Units Subcutaneous Q4H   insulin glargine-yfgn  5 Units Subcutaneous QHS   loratadine  10 mg Per Tube Daily   multivitamin  1 tablet Per Tube QHS   mouth rinse  15 mL Mouth Rinse 4 times per day   polyethylene glycol  17 g Per Tube Daily   thiamine  100 mg Per Tube Daily    have reviewed scheduled and prn medications.  Physical Exam: General:NAD, comfortable, NG tube in place. Heart:RRR, s1s2 nl Lungs:clear b/l, no crackle Abdomen:soft, Non-tender, non-distended Extremities:No edema Dialysis Access: Right upper extremity AV fistula has good thrill and bruit.  Shyla Gayheart Prasad Ondra Deboard 03/26/2023,9:58 AM  LOS: 15 days

## 2023-03-26 NOTE — TOC Progression Note (Signed)
Transition of Care (TOC) - Progression Note  Donn Pierini RN,BSN Transitions of Care Unit 4NP (Non Trauma)- RN Case Manager See Treatment Team for direct Phone #   Patient Details  Name: Susan Evans MRN: [id number] Date of Birth: [birth year]  Transition of [facility] CM/SW Contact  Zenda Alpers, Lenn Sink, RN Phone Number: 03/26/2023, 1:35 PM  Clinical Narrative:    Msg received from team that insurance has denied P2P for [facility]), noted plan for PEG tube placement, possibly 7/5 with Gen. Surgery.  CM will continue to follow pt progression, note most recent PT/OT recommendations are for [facility]- will f/u on Friday to see if pt still appropriate for home w/ [facility] vs needing a rehab stay. Will also need to follow tube feed needs post PEG placement.    Expected Discharge Plan: Long Term Acute Care (LTAC) Barriers to Discharge: Continued Medical Work up  Expected Discharge Plan and Services   Discharge Planning Services: CM Consult Post Acute Care Choice: IP Rehab, Long Term Acute Care (LTAC) Living arrangements for the past 2 months: Apartment                                       Social Determinants of Health (SDOH) Interventions SDOH Screenings   Food Insecurity: No Food Insecurity (03/13/2022)  Housing: Low Risk  (03/13/2022)  Transportation Needs: No Transportation Needs (03/13/2022)  Alcohol Screen: Low Risk  (03/13/2022)  Depression (PHQ2-9): Medium Risk (03/13/2022)  Financial Resource Strain: Low Risk  (03/13/2022)  Physical Activity: Insufficiently Active (03/13/2022)  Social Connections: Socially Integrated (03/13/2022)  Stress: No Stress Concern Present (03/13/2022)  Tobacco Use: Medium Risk (03/23/2023)    Readmission Risk Interventions     No data to display

## 2023-03-26 NOTE — Consult Note (Signed)
Susan Evans Dec 11, 1955  [id number].    Requesting MD: Erlinda Hong, MD Chief Complaint/Reason for Consult: PEG tube, dysphagia   HPI:  Susan Evans is a 67 y/o F with ESRD on HD TTS, HTN, carotid stenosis, DM, laryngeal CA s/p TORS and chemoradiation in 2011, and dysphagia who was admitted to the hospital 6/18 due to tachycardia and lip swelling that progressed with laryngeal edema with stridor. She required emergent nasotracheal intubation by ENT and was extubated on 6/24. She had a similar episode of facial/glottic edema one year ago with no clear cause, also requiring intubation. She was diagnosed with a Streptococcus agalactiae bacteremia this admission and is on cefazolin. TEE without evidence of valvular vegetations and RUE fistula site without evidence of infection. The patient has severe dysphagia, attributed to her history of neck radiation, and we are asked to see for PEG placement.   Blood thinners: current on subcutaneous heparin for DVT ppx Surgical history: PEG tube in 2011  Patient tells me that she had a PEG after her surgery and only used it for one week. Denies any other abdominal surgeries.   ROS: Review of Systems  All other systems reviewed and are negative.   Family History  Problem Relation Age of Onset   Stroke Father    Cancer Father        unknown ca   Hypertension Mother    Diabetes Mother    Chronic Renal Failure Sister    Chronic Renal Failure Brother    Cancer Paternal Aunt        colon ca   Breast cancer Neg Hx     Past Medical History:  Diagnosis Date   AKI (acute kidney injury) (HCC) 08/12/2019   Anaphylactic shock, unspecified, initial encounter 05/09/2020   Angio-edema 03/20/2023   Chronic kidney disease    Dialysis T/Th/Sat- [address]   CKD stage G1/A3, GFR > 90 and albumin creatinine ratio >300 mg/g 07/08/2016   Nephrotic range proteinuria   Diabetes mellitus    type 2    Dyspnea    "when I have too much  fluid."   Ganglion cyst    right wrist   GERD (gastroesophageal reflux disease)    History of radiation therapy 02/27/10 -04/13/10   left base of tongue, bilat neck   Hyperlipidemia    Hypertension    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/21/2021   Iron deficiency anemia    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   Menopause    Thyroid disease    Hyperthyroidism   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/08/2020   Tobacco abuse    PT HAS QUIT    Past Surgical History:  Procedure Laterality Date   AV FISTULA PLACEMENT Left 06/02/2020   Procedure: INSERTION OF ARTERIOVENOUS (AV) GORE-TEX GRAFT ARM LEFT;  Surgeon: Cephus Shelling, MD;  Location: [facility] OR;  Service: Vascular;  Laterality: Left;   AV FISTULA PLACEMENT Right 12/27/2020   Procedure: RIGHT ARM CEPHALIC ARTERIOVENOUS FISTULA CREATION;  Surgeon: Maeola Harman, MD;  Location: [facility] OR;  Service: Vascular;  Laterality: Right;   AVGG REMOVAL Left 12/21/2021   Procedure: EXCISION OF LEFT ARM ARTERIOVENOUS GORETEX GRAFT (AVGG);  Surgeon: Maeola Harman, MD;  Location: [facility] OR;  Service: Vascular;  Laterality: Left;   IR FLUORO GUIDE CV LINE LEFT  05/15/2020   IR REMOVAL TUN CV CATH W/O FL  05/15/2020   IR REMOVAL TUN CV CATH W/O  FL  06/15/2021   IR US GUIDE VASC ACCESS LEFT  05/15/2020   NECK SURGERY     REDUCTION MAMMAPLASTY     TRACHEOSTOMY TUBE PLACEMENT N/A 03/11/2023   Procedure: AWAKE INTUBATION;  Surgeon: Christia Reading, MD;  Location: [facility] OR;  Service: ENT;  Laterality: N/A;    Social History:  reports that she quit smoking about 1 years ago. Her smoking use included cigarettes. She smoked an average of .3 packs per day. She has never been exposed to tobacco smoke. She has never used smokeless tobacco. She reports that she does not drink alcohol and does not use drugs.  Allergies:  Allergies  Allergen Reactions   Hydrochlorothiazide Itching and Other (See Comments)     Hyponatremia    Penicillins Other (See Comments)    "Takes out all of her hair"    Medications Prior to Admission  Medication Sig Dispense Refill   Accu-Chek FastClix Lancets MISC 5 (FIVE) TIMES DAILY DIAGNOSIS CODE E11.9 204 each 8   acetaminophen (TYLENOL) 325 MG tablet Take 2 tablets (650 mg total) by mouth every 4 (four) hours as needed for mild pain (temp > 101.5).     atorvastatin (LIPITOR) 80 MG tablet TAKE 1 TABLET BY MOUTH EVERY DAY 90 tablet 3   B-D UF III MINI PEN NEEDLES 31G X 5 MM MISC USE TO INJECT NOVOLOG 3 TIMES A DAY. DIAGNOSIS CODE E11.9, Z79.4 100 each 8   BD VEO INSULIN SYRINGE U/F 31G X 15/64" 0.3 ML MISC USE TO INJECT LANTUS INSULIN TWO TIMES A DAY 60 each 11   Blood Glucose Monitoring Suppl (ACCU-CHEK AVIVA PLUS) w/Device KIT Use to test blood glucose  3 times daily 1 kit 0   Cholecalciferol (VITAMIN D3) 50 MCG (2000 UT) capsule TAKE 1 CAPSULE BY MOUTH EVERY DAY 100 capsule 3   cloNIDine (CATAPRES - DOSED IN MG/24 HR) 0.2 mg/24hr patch Place 1 patch (0.2 mg total) onto the skin once a week. 4 patch 3   glucose blood (ACCU-CHEK GUIDE) test strip TEST 5 (FIVE) TIMES DAILY DIAGNOSIS CODE E11.9 150 strip 12   hydrALAZINE (APRESOLINE) 50 MG tablet TAKE 1 TABLET BY MOUTH TWICE A DAY 180 tablet 2   lidocaine-prilocaine (EMLA) cream Apply 1 application. topically as needed (dialysis).     omeprazole (PRILOSEC) 20 MG capsule TAKE 1 CAPSULE BY MOUTH EVERY DAY 90 capsule 2   sevelamer carbonate (RENVELA) 800 MG tablet Take 800 mg by mouth 3 (three) times daily with meals.     verapamil (CALAN-SR) 180 MG CR tablet TAKE 2 TABLETS (360 MG TOTAL) BY MOUTH AT BEDTIME. 60 tablet 3     Physical Exam: Blood pressure (!) 100/55, pulse 90, temperature (!) 97.5 F (36.4 C), temperature source Oral, resp. rate 20, height 5\' 2"  (1.575 m), weight 38.6 kg, SpO2 95 %. General:chronically ill appearing black female, sitting up in the chair in NAD HEENT: head -normocephalic, atraumatic; Eyes:  PERRLA, no conjunctival injection; cortrak in place CV- RRR Pulm- breathing is slightly labored on  Abd- soft, protuberant, previous G tube  scar noted, no hernias palpable, firmness in RLQ ?stool in colon vs SQh injection site GU- deferred  MSK- UE/LE symmetrical, no cyanosis, clubbing, or edema. Neuro- CN II-XII grossly in tact, no paresthesias. Psych- Alert and Oriented x3 Skin: warm and dry, no rashes or lesions   Results for orders placed or performed during the hospital encounter of 03/11/23 (from the past 48 hour(s))  Glucose, capillary  Status: Abnormal   Collection Time: 03/24/23 11:50 AM  Result Value Ref Range   Glucose-Capillary 211 (H) 70 - 99 mg/dL    Comment: Glucose reference range applies only to samples taken after fasting for at least 8 hours.  Glucose, capillary     Status: Abnormal   Collection Time: 03/24/23  3:51 PM  Result Value Ref Range   Glucose-Capillary 217 (H) 70 - 99 mg/dL    Comment: Glucose reference range applies only to samples taken after fasting for at least 8 hours.  Glucose, capillary     Status: Abnormal   Collection Time: 03/24/23  7:28 PM  Result Value Ref Range   Glucose-Capillary 146 (H) 70 - 99 mg/dL    Comment: Glucose reference range applies only to samples taken after fasting for at least 8 hours.  Glucose, capillary     Status: Abnormal   Collection Time: 03/24/23 11:30 PM  Result Value Ref Range   Glucose-Capillary 152 (H) 70 - 99 mg/dL    Comment: Glucose reference range applies only to samples taken after fasting for at least 8 hours.  Glucose, capillary     Status: Abnormal   Collection Time: 03/25/23  3:15 AM  Result Value Ref Range   Glucose-Capillary 193 (H) 70 - 99 mg/dL    Comment: Glucose reference range applies only to samples taken after fasting for at least 8 hours.  Renal function panel     Status: Abnormal   Collection Time: 03/25/23  4:40 AM  Result Value Ref Range   Sodium 134 (L) 135 - 145 mmol/L    Potassium 4.4 3.5 - 5.1 mmol/L   Chloride 90 (L) 98 - 111 mmol/L   CO2 30 22 - 32 mmol/L   Glucose, Bld 241 (H) 70 - 99 mg/dL    Comment: Glucose reference range applies only to samples taken after fasting for at least 8 hours.   BUN 110 (H) 8 - 23 mg/dL   Creatinine, Ser 1.61 (H) 0.44 - 1.00 mg/dL   Calcium 8.9 8.9 - 09.6 mg/dL   Phosphorus 3.2 2.5 - 4.6 mg/dL   Albumin 2.7 (L) 3.5 - 5.0 g/dL   GFR, Estimated 9 (L) >60 mL/min    Comment: (NOTE) Calculated using the CKD-EPI Creatinine Equation (2021)    Anion gap 14 5 - 15    Comment: Performed at [facility], [address], [place], [place] [address]  CBC     Status: Abnormal   Collection Time: 03/25/23  4:40 AM  Result Value Ref Range   WBC 17.8 (H) 4.0 - 10.5 K/uL   RBC 3.18 (L) 3.87 - 5.11 MIL/uL   Hemoglobin 8.3 (L) 12.0 - 15.0 g/dL    Comment: REPEATED TO VERIFY POST TRANSFUSION SPECIMEN    HCT 25.7 (L) 36.0 - 46.0 %   MCV 80.8 80.0 - 100.0 fL    Comment: POST TRANSFUSION SPECIMEN   MCH 26.1 26.0 - 34.0 pg   MCHC 32.3 30.0 - 36.0 g/dL   RDW 98.1 (H) 19.1 - 47.8 %   Platelets 221 150 - 400 K/uL    Comment: CONSISTENT WITH PREVIOUS RESULT REPEATED TO VERIFY    nRBC 0.0 0.0 - 0.2 %    Comment: Performed at [facility], [address] [place], [place] [address]  Glucose, capillary     Status: Abnormal   Collection Time: 03/25/23  7:32 AM  Result Value Ref Range   Glucose-Capillary 185 (H) 70 -  99 mg/dL    Comment: Glucose reference range applies only to samples taken after fasting for at least 8 hours.  Glucose, capillary     Status: Abnormal   Collection Time: 03/25/23 11:40 AM  Result Value Ref Range   Glucose-Capillary 225 (H) 70 - 99 mg/dL    Comment: Glucose reference range applies only to samples taken after fasting for at least 8 hours.  Glucose, capillary     Status: Abnormal   Collection Time: 03/25/23  4:38 PM  Result Value Ref Range   Glucose-Capillary 214 (H) 70 - 99 mg/dL    Comment:  Glucose reference range applies only to samples taken after fasting for at least 8 hours.  Glucose, capillary     Status: Abnormal   Collection Time: 03/25/23  7:37 PM  Result Value Ref Range   Glucose-Capillary 254 (H) 70 - 99 mg/dL    Comment: Glucose reference range applies only to samples taken after fasting for at least 8 hours.  Glucose, capillary     Status: Abnormal   Collection Time: 03/25/23 11:32 PM  Result Value Ref Range   Glucose-Capillary 185 (H) 70 - 99 mg/dL    Comment: Glucose reference range applies only to samples taken after fasting for at least 8 hours.  Renal function panel     Status: Abnormal   Collection Time: 03/26/23  2:51 AM  Result Value Ref Range   Sodium 132 (L) 135 - 145 mmol/L   Potassium 4.1 3.5 - 5.1 mmol/L   Chloride 91 (L) 98 - 111 mmol/L   CO2 30 22 - 32 mmol/L   Glucose, Bld 184 (H) 70 - 99 mg/dL    Comment: Glucose reference range applies only to samples taken after fasting for at least 8 hours.   BUN 84 (H) 8 - 23 mg/dL   Creatinine, Ser 1.61 (H) 0.44 - 1.00 mg/dL   Calcium 8.3 (L) 8.9 - 10.3 mg/dL   Phosphorus 2.5 2.5 - 4.6 mg/dL   Albumin 2.4 (L) 3.5 - 5.0 g/dL   GFR, Estimated 12 (L) >60 mL/min    Comment: (NOTE) Calculated using the CKD-EPI Creatinine Equation (2021)    Anion gap 11 5 - 15    Comment: Performed at [facility], [address] [place], [place] [address]  CBC     Status: Abnormal   Collection Time: 03/26/23  2:51 AM  Result Value Ref Range   WBC 13.3 (H) 4.0 - 10.5 K/uL   RBC 2.67 (L) 3.87 - 5.11 MIL/uL   Hemoglobin 7.0 (L) 12.0 - 15.0 g/dL   HCT 54.0 (L) 98.1 - 19.1 %   MCV 82.0 80.0 - 100.0 fL   MCH 26.2 26.0 - 34.0 pg   MCHC 32.0 30.0 - 36.0 g/dL   RDW 47.8 (H) 29.5 - 62.1 %   Platelets 207 150 - 400 K/uL   nRBC 0.2 0.0 - 0.2 %    Comment: Performed at [facility], [address] [place], [place] [address]  Glucose, capillary     Status: Abnormal   Collection Time: 03/26/23  3:03 AM  Result  Value Ref Range   Glucose-Capillary 200 (H) 70 - 99 mg/dL    Comment: Glucose reference range applies only to samples taken after fasting for at least 8 hours.  Glucose, capillary     Status: Abnormal   Collection Time: 03/26/23  7:31 AM  Result Value Ref Range   Glucose-Capillary 190 (H) 70 -  99 mg/dL    Comment: Glucose reference range applies only to samples taken after fasting for at least 8 hours.   CT ABDOMEN WO CONTRAST  Result Date: 03/25/2023 CLINICAL DATA:  Assess anatomy for g-tube placement in IR EXAM: CT ABDOMEN WITHOUT CONTRAST TECHNIQUE: Multidetector CT imaging of the abdomen was performed following the standard protocol without IV contrast. RADIATION DOSE REDUCTION: This exam was performed according to the departmental dose-optimization program which includes automated exposure control, adjustment of the mA and/or kV according to patient size and/or use of iterative reconstruction technique. COMPARISON:  08/12/2019 FINDINGS: Lower chest: Diffuse coronary artery and aortic calcifications. No acute abnormality Hepatobiliary: Gallbladder is mildly distended. No visible intrahepatic or common bile duct dilatation. No focal hepatic abnormality. Pancreas: Calcifications throughout the pancreas compatible with chronic pancreatitis. Pancreatic duct mildly dilated in the pancreatic body measuring 7 mm. Spleen: No focal abnormality.  Normal size. Adrenals/Urinary Tract: Bilateral renal low-density lesions are difficult to characterize on this noncontrast study. 1.9 cm low-density lesion off the midpole of the left kidney. 2 cm low-density lesion off the lower pole of the right kidney. No stones or hydronephrosis. Adrenal glands unremarkable. Stomach/Bowel: Stomach is moderately distended with gas. Feeding tube tip is in the distal stomach. Stomach is noted along the anterior abdominal wall. Large amount of stool throughout the colon with moderate gaseous distension. The colon does not overlie/cover  the stomach. No evidence of bowel obstruction. Vascular/Lymphatic: Diffuse aortoiliac atherosclerosis. No evidence of aneurysm or adenopathy. Other: No free fluid or free air. Musculoskeletal: No acute bony abnormality. IMPRESSION: Mild gaseous distention of the stomach which lies just under the anterior abdominal wall. No: Overlies or covers the stomach. Feeding tube tip in the distal stomach. Moderate stool burden and gaseous distention of the colon. Changes of chronic pancreatitis. Coronary artery disease.  Aortoiliac atherosclerosis. Electronically Signed   By: Charlett Nose M.D.   On: 03/25/2023 19:38      Assessment/Plan Dysphagia, history of head and neck cancer s/p surgical treatment and chemoradiation in 2011  - tolerating TF via cortrak  - CT yesterday shows stomach abutting the anterior abdominal wall, looks amenable to PEG placement  - I have reviewed the patients history, labs, notes from current and previous medical providers and agree that PEG placement is indicated. This will need to be done in the OR given her current respiratory status.  Will discuss with Dr. Bedelia Person. Possibly OR Friday for PEG.  Angioedema -extubated, no airway concerns currently ESRD on HD TTS HTN DM2 GERD Carotid stenosis   FEN - TF via cortrak VTE - SQH ID - Ancef for Streptococcus agalactiae bacteremia Admit - medical service  I reviewed nursing notes, Consultant renal notes, hospitalist notes, last 24 h vitals and pain scores, last 48 h intake and output, last 24 h labs and trends, and last 24 h imaging results.  Adam Phenix, [facility] 03/26/2023, 10:42 AM Please see Amion for pager number during day hours 7:00am-4:30pm or 7:00am -11:30am on weekends

## 2023-03-26 NOTE — Progress Notes (Signed)
Subjective: No overnight events.  Brother was bedside at the time of exam.  Patient was subjectively unchanged from yesterday with no new concerns.  Objective:  Vital signs in last 24 hours: Vitals:   03/25/23 2333 03/26/23 0304 03/26/23 0735 03/26/23 1115  BP: (!) 119/57 116/60 (!) 100/55 113/62  Pulse: 92 94 90 98  Resp: 20 19 20  (!) 26  Temp: 98 F (36.7 C) 98.1 F (36.7 C) (!) 97.5 F (36.4 C) 98 F (36.7 C)  TempSrc: Oral Oral Oral Oral  SpO2: 92% 94% 95% 91%  Weight:  38.6 kg    Height:        Physical Exam: Constitutional: Alert and oriented, lying comfortably in bed Cardio: Regular rate and rhythm, no murmurs Pulm: Clear to auscultation bilaterally Abdomen: Flat and soft Skin: No skin lesions or abnormalities noted Neuro: Alert and oriented; no new focal deficits Psych: Normal mood and affect  Labs:  CBC: Hemoglobin 8.3, hematocrit 25.7. Stable.  White blood cells elevated at 17.8, up from 30.9 yesterday.  BMP: BUN and creatinine significantly elevated as expected in the setting of ESRD.  Albumin 2.7.  Glucose 241.  Otherwise unremarkable.  Assessment/Plan:  Principal Problem:   Bacteremia due to group B Streptococcus Active Problems:   Type 2 diabetes mellitus with complication (HCC)   Essential hypertension   ESRD (end stage renal disease) (HCC)   Acute hypoxic respiratory failure (HCC)   Microcytic anemia   Malignant neoplasm of pharynx, unspecified (HCC)   Protein-calorie malnutrition, severe   Dysphagia, pharyngoesophageal phase   Patient Summary: Susan Evans is a 67 year old female with a past medical history of hypertension, diabetes, ESRD on dialysis TTS, who presented with angioedema edema that required intubation.  She was subsequently found to have group B strep bacteremia, which is being treated with IV cefazolin.  She continues to experience dysphagia with evidence of aspiration on swallow studies and has been receiving her nutrition  through an NG tube.  It is unlikely that her swallowing will significantly improve given her condition, so she will need a PEG tube long-term.  We are moving forward with PEG tube placement.  Group B strep bacteremia Patient continues to be afebrile with no clinical signs of systemic infection.  White blood cells decreased to 13 from 17 yesterday. -Continue IV cefazolin until 04/23/2023 -CBC AM  Angioedema History of head and neck cancer, s/p radiation therapy Dysphagia No acute concerns of respiratory symptoms or airway edema today.  She continues to have difficulty swallowing and receives nutrition through an NG tube.  The patient and her family have agreed to proceed with the PEG tube.  We were previously considering an LTAC for discharge, but with the PEG tube the patient she will likely be able to return home or go to a SNF. -Speech following -Interventional radiology consulted regarding PEG tube placement; she will need to be NPO Sunday at midnight for PEG tube placement on Monday.  Aspirin was discontinued on 7/2 and they are requesting 5 days off aspirin in the setting of ESRD prior to PEG tube placement.  -Order coagulation studies -Consulted surgery for possible PEG placement this week  ESRD on HD TTS  No electrolyte abnormalities. HD TTS. -Trend BMP -Dialysis TTS  Type 2 diabetes mellitus  Glucose ranges from 185-349 in the past 24 hours.  She is being followed by the diabetes coordinator.  She is on Lantus 30 twice daily at home and has only been receiving 5 units at bedtime  during admission. -Increase Semglee to 15 units at bedtime, remain on SSI -CBG per unit protocol  Hypertension Blood pressure remains soft, so we will continue to hold her hydralazine and carvedilol.    Carotid Stenosis -Hold aspirin 81 mg daily -Continue atorvastatin 80 mg daily  Diet: NPO (ice chip exception), CorTrak w/ TF IVF: None VTE: heparin injection 5,000 units Code: Full AB:  Cefazolin Family Update: Brother bedside this morning  Dispo: Anticipated discharge to  Home vs SNF  pending medical stability and safe dispo plan.   Annett Fabian, MD 03/26/2023, 11:45 AM Pager: [phone] After 5pm on weekdays and 1pm on weekends: On Call pager [phone]

## 2023-03-26 NOTE — Progress Notes (Signed)
Brief Nutrition Note  Consult received for enteral nutrition initiation and management. Pt last seen by RD on 7/01. Pt continues to receive tube feeds at goal rate via Cortrak tube.  Reached out to MD Team and there is nothing to change at this time. Pt is still considering whether to proceed with PEG tube placement. Per RN note yesterday, pt continues to take POs despite risk of aspiration.  RD continues to recommend PEG tube placement in accordance with SLP recommendations.  Will continue current tube feeds via Cortrak: - Nepro Carb Steady @ 45 ml/hr (1080 ml/day)   Tube feeding regimen provides 1912 kcal, 87.5 grams of protein, and 785 ml of H2O.  RD will continue to follow pt during admission.   Susan Clause, MS, RD, LDN Inpatient Clinical Dietitian Please see AMiON for contact information.

## 2023-03-26 NOTE — Consult Note (Signed)
   [facility] CM Inpatient Consult   03/26/2023  Susan Evans Jun 02, 1956 [id number]  [facility] [[facility]]  Accountable Care Organization [ACO] Patient: [employer] [facility] [Dual complete]  Primary Care Provider:  Kathleen Lime, MD with [facility] Internal Medicine   Patient screened for hospitalization with noted high risk score for unplanned readmission risk 15 day length of stay [which includes an ICU stay] and  to assess for potential [facility]  [[facility]] Care Management service needs for post hospital transition for care coordination.  Review of patient's electronic medical record reveals patient is being considered for an LTACH level of care.  Plan:  Continue to follow progress and disposition to assess for post hospital community care coordination/management needs.  Referral request for community care coordination: pending disposition needs patient likely for a higher level of care before able to return to community.  Of note, [facility] Care Management/Population Health does not replace or interfere with any arrangements made by the Inpatient Transition of Care team.  For questions contact:   Charlesetta Shanks, RN BSN CCM [facility]  [phone] business mobile phone Toll free office [phone]  *Concierge Line  [phone] Fax number: [phone] [email or web address]

## 2023-03-26 NOTE — Plan of Care (Signed)
IR was requested for image guided G tube placement.   Case was reviewed by Dr. Milford Cage and anatomy is amenable for percutaneous G tube placement, IR cannot proceed until next Monday due to patient on ASA 81 mg which requires 5 day hold.    It appears that general surgery was consulted for G tube placement and it is tentatively scheduled for this Friday.   Will delete IR G tube placement order.  Please call IR for questions and concerns.   Lynann Bologna Oniyah Rohe PA-C 03/26/2023 1:01 PM

## 2023-03-27 DIAGNOSIS — I120 Hypertensive chronic kidney disease with stage 5 chronic kidney disease or end stage renal disease: Secondary | ICD-10-CM | POA: Diagnosis not present

## 2023-03-27 DIAGNOSIS — E1122 Type 2 diabetes mellitus with diabetic chronic kidney disease: Secondary | ICD-10-CM | POA: Diagnosis not present

## 2023-03-27 DIAGNOSIS — B951 Streptococcus, group B, as the cause of diseases classified elsewhere: Secondary | ICD-10-CM | POA: Diagnosis not present

## 2023-03-27 DIAGNOSIS — R7881 Bacteremia: Secondary | ICD-10-CM | POA: Diagnosis not present

## 2023-03-27 LAB — GLUCOSE, CAPILLARY
Glucose-Capillary: 129 mg/dL — ABNORMAL HIGH (ref 70–99)
Glucose-Capillary: 202 mg/dL — ABNORMAL HIGH (ref 70–99)
Glucose-Capillary: 220 mg/dL — ABNORMAL HIGH (ref 70–99)
Glucose-Capillary: 192 mg/dL — ABNORMAL HIGH (ref 70–99)
Glucose-Capillary: 231 mg/dL — ABNORMAL HIGH (ref 70–99)

## 2023-03-27 LAB — RENAL FUNCTION PANEL
Albumin: 2.5 g/dL — ABNORMAL LOW (ref 3.5–5.0)
Anion gap: 11 (ref 5–15)
BUN: 123 mg/dL — ABNORMAL HIGH (ref 8–23)
CO2: 32 mmol/L (ref 22–32)
Calcium: 8.4 mg/dL — ABNORMAL LOW (ref 8.9–10.3)
Chloride: 89 mmol/L — ABNORMAL LOW (ref 98–111)
Creatinine, Ser: 5.08 mg/dL — ABNORMAL HIGH (ref 0.44–1.00)
GFR, Estimated: 9 mL/min — ABNORMAL LOW (ref >60)
Glucose, Bld: 151 mg/dL — ABNORMAL HIGH (ref 70–99)
Phosphorus: 3.5 mg/dL (ref 2.5–4.6)
Potassium: 4.3 mmol/L (ref 3.5–5.1)
Sodium: 132 mmol/L — ABNORMAL LOW (ref 135–145)

## 2023-03-27 LAB — HEPATIC FUNCTION PANEL
ALT: 5 U/L (ref 0–44)
AST: 78 U/L — ABNORMAL HIGH (ref 15–41)
Albumin: 2.5 g/dL — ABNORMAL LOW (ref 3.5–5.0)
Alkaline Phosphatase: 147 U/L — ABNORMAL HIGH (ref 38–126)
Bilirubin, Direct: <0.1 mg/dL (ref 0.0–0.2)
Total Bilirubin: 0.2 mg/dL — ABNORMAL LOW (ref 0.3–1.2)
Total Protein: 5 g/dL — ABNORMAL LOW (ref 6.5–8.1)

## 2023-03-27 LAB — CBC
HCT: 16.4 % — ABNORMAL LOW (ref 36.0–46.0)
HCT: 19.9 % — ABNORMAL LOW (ref 36.0–46.0)
Hemoglobin: 5.3 g/dL — CL (ref 12.0–15.0)
Hemoglobin: 6.7 g/dL — CL (ref 12.0–15.0)
MCH: 27 pg (ref 26.0–34.0)
MCH: 28.2 pg (ref 26.0–34.0)
MCHC: 32.3 g/dL (ref 30.0–36.0)
MCHC: 33.7 g/dL (ref 30.0–36.0)
MCV: 83.7 fL (ref 80.0–100.0)
MCV: 83.6 fL (ref 80.0–100.0)
Platelets: 211 10*3/uL (ref 150–400)
Platelets: 196 10*3/uL (ref 150–400)
RBC: 1.96 MIL/uL — ABNORMAL LOW (ref 3.87–5.11)
RBC: 2.38 MIL/uL — ABNORMAL LOW (ref 3.87–5.11)
RDW: 16.9 % — ABNORMAL HIGH (ref 11.5–15.5)
RDW: 15.9 % — ABNORMAL HIGH (ref 11.5–15.5)
WBC: 15.7 10*3/uL — ABNORMAL HIGH (ref 4.0–10.5)
WBC: 12.8 10*3/uL — ABNORMAL HIGH (ref 4.0–10.5)
nRBC: 0.3 % — ABNORMAL HIGH (ref 0.0–0.2)
nRBC: 0.9 % — ABNORMAL HIGH (ref 0.0–0.2)

## 2023-03-27 LAB — BPAM RBC
Blood Product Expiration Date: 202407232359
Blood Product Expiration Date: 202407262359
Unit Type and Rh: 6200

## 2023-03-27 LAB — LACTATE DEHYDROGENASE: LDH: 212 U/L — ABNORMAL HIGH (ref 98–192)

## 2023-03-27 LAB — TYPE AND SCREEN: Unit division: 0

## 2023-03-27 LAB — HEMOGLOBIN AND HEMATOCRIT, BLOOD
HCT: 25.6 % — ABNORMAL LOW (ref 36.0–46.0)
Hemoglobin: 8.8 g/dL — ABNORMAL LOW (ref 12.0–15.0)

## 2023-03-27 LAB — PREPARE RBC (CROSSMATCH)

## 2023-03-27 LAB — RETIC PANEL
Immature Retic Fract: 45.3 % — ABNORMAL HIGH (ref 2.3–15.9)
RBC.: 2.38 MIL/uL — ABNORMAL LOW (ref 3.87–5.11)
Retic Count, Absolute: 89.5 10*3/uL (ref 19.0–186.0)
Retic Ct Pct: 3.8 % — ABNORMAL HIGH (ref 0.4–3.1)
Reticulocyte Hemoglobin: 29 pg (ref >27.9)

## 2023-03-27 MED ORDER — SODIUM CHLORIDE 0.9% IV SOLUTION: Freq: Once | INTRAVENOUS | Status: DC

## 2023-03-27 MED ORDER — LIDOCAINE HCL (PF) 1 % IJ SOLN: 5.0000 mL | INTRAMUSCULAR | Status: DC | PRN

## 2023-03-27 MED ORDER — PENTAFLUOROPROP-TETRAFLUOROETH EX AERO: 1.0000 | INHALATION_SPRAY | CUTANEOUS | Status: DC | PRN

## 2023-03-27 MED ORDER — CHLORHEXIDINE GLUCONATE CLOTH 2 % EX PADS
6.0000 | MEDICATED_PAD | Freq: Every day | CUTANEOUS | Status: DC
  Administered 2023-03-27 – 2023-03-31 (×5): 6 via TOPICAL

## 2023-03-27 MED ORDER — ANTICOAGULANT SODIUM CITRATE 4% (200MG/5ML) IV SOLN: 5.0000 mL | Status: DC | PRN

## 2023-03-27 MED ORDER — HEPARIN SODIUM (PORCINE) 1000 UNIT/ML DIALYSIS: 1000.0000 [IU] | INTRAMUSCULAR | Status: DC | PRN

## 2023-03-27 MED ORDER — ALTEPLASE 2 MG IJ SOLR: 2.0000 mg | Freq: Once | INTRAMUSCULAR | Status: DC | PRN

## 2023-03-27 MED ORDER — LIDOCAINE-PRILOCAINE 2.5-2.5 % EX CREA: 1.0000 | TOPICAL_CREAM | CUTANEOUS | Status: DC | PRN

## 2023-03-27 NOTE — Progress Notes (Signed)
[facility] NEPHROLOGY PROGRESS NOTE  Assessment/ Plan: [name]   OP HD: TTS [place]     4h  350/500   36kg  3K/2.5Ca bath  RUA AVF   -Heparin  2000 units initial bolus + 2000 units IV midrun-Decrease dose on discharge-too high for weight.   - last OP HD 6/18, post wt 38.4  - hectorol 2 mcg IV three times per week  - coming off at dry wt most of last 2 wks  # Angioedema: occurred 12/2021 as well.  Both times this has happened have been in the setting of infection but that's the only common link, not sure that it was the same organism anyway.  CT maxillofacial showed soft tissue swelling/ cellulitis w/o abscess.  No sign of AVF infection on exam.  Pt required nasal intubation by ENT. Pt rec'd a course of decadron and pepcid. Pt is already dialyzing on a hypoallergenic dialyzer at OP dialysis for the past year. Will keep this on indefinitely.    #Acute resp failure - was intubated nasally by ENT initially. F/u video laryngoscope done by CCM showed improved swelling of the cords. Pt was extubated successfully.   # Hyperkalemia: Managed with dialysis.  #ESRD - TTS HD.  Plan for regular dialysis today.  #S agalactiae bacteremia: as above in #1, on cefazolin.  Per pharm and ID she will need IV Ancef 2gm post HD TTS through 7/31. VVS consulted to assess her AVF, they found no signs of infection. Appreciate assistance.   # HTN/ vol: Monitor BP, UF as tolerated.  # Anemia of ESRD: Hemoglobin dropped.  No sign of active bleed.  Discussed with the primary team.  Plan for 2 units of blood transfusion.  Continue erythropoietin with HD.    # Metabolic Bone Disease: Phosphorus acceptable, off of binders.    # Dysphagia with history of head and neck cancer status postradiation therapy: Currently receiving feeding via NG tube.  Noted plan for possible PEG tube placement.  Subjective: Seen and examined.  No new event except drop in hemoglobin.  No obvious bleeding.  Denies  nausea, vomiting, chest pain, shortness of breath.  Primary team was presented at the bedside during the rounds.  Objective Vital signs in last 24 hours: Vitals:   03/27/23 0900 03/27/23 1000 03/27/23 1030 03/27/23 1045  BP:   (!) 135/58   Pulse: (!) 103 93 94 94  Resp:   18 18  Temp:   98.2 F (36.8 C) 98.1 F (36.7 C)  TempSrc:   Oral Oral  SpO2: (!) 89% 92% 93% 95%  Weight:      Height:       Weight change:   Intake/Output Summary (Last 24 hours) at 03/27/2023 1115 Last data filed at 03/27/2023 1030 Gross per 24 hour  Intake 315 ml  Output --  Net 315 ml        Labs: RENAL PANEL Recent Labs  Lab 03/23/23 0253 03/24/23 0404 03/25/23 0440 03/26/23 0251 03/27/23 0357  NA 134* 135 134* 132* 132*  K 4.2 4.4 4.4 4.1 4.3  CL 94* 91* 90* 91* 89*  CO2 25 28 30 30  32  GLUCOSE 200* 207* 241* 184* 151*  BUN 46* 83* 110* 84* 123*  CREATININE 2.73* 4.04* 5.08* 3.91* 5.08*  CALCIUM 8.3* 8.7* 8.9 8.3* 8.4*  PHOS 2.9 3.8 3.2 2.5 3.5  ALBUMIN 2.3* 2.5* 2.7* 2.4* 2.5*     Liver Function Tests: Recent Labs  Lab 03/25/23 0440 03/26/23 0251 03/27/23 0357  ALBUMIN 2.7* 2.4* 2.5*    No results for input(s): "LIPASE", "AMYLASE" in the last 168 hours. No results for input(s): "AMMONIA" in the last 168 hours. CBC: Recent Labs    03/23/23 0253 03/24/23 0404 03/25/23 0440 03/26/23 0251 03/27/23 0357  HGB 7.3* 6.4* 8.3* 7.0* 5.3*  MCV 77.2* 78.7* 80.8 82.0 83.7  FERRITIN 6,385*  --   --   --   --   TIBC 168*  --   --   --   --   IRON 155  --   --   --   --      Cardiac Enzymes: No results for input(s): "CKTOTAL", "CKMB", "CKMBINDEX", "TROPONINI" in the last 168 hours. CBG: Recent Labs  Lab 03/26/23 1619 03/26/23 1950 03/26/23 2258 03/27/23 0300 03/27/23 0743  GLUCAP 178* 215* 187* 129* 202*     Iron Studies:  No results for input(s): "IRON", "TIBC", "TRANSFERRIN", "FERRITIN" in the last 72 hours.  Studies/Results: CT ABDOMEN WO CONTRAST  Result  Date: 03/25/2023 CLINICAL DATA:  Assess anatomy for g-tube placement in IR EXAM: CT ABDOMEN WITHOUT CONTRAST TECHNIQUE: Multidetector CT imaging of the abdomen was performed following the standard protocol without IV contrast. RADIATION DOSE REDUCTION: This exam was performed according to the departmental dose-optimization program which includes automated exposure control, adjustment of the mA and/or kV according to patient size and/or use of iterative reconstruction technique. COMPARISON:  08/12/2019 FINDINGS: Lower chest: Diffuse coronary artery and aortic calcifications. No acute abnormality Hepatobiliary: Gallbladder is mildly distended. No visible intrahepatic or common bile duct dilatation. No focal hepatic abnormality. Pancreas: Calcifications throughout the pancreas compatible with chronic pancreatitis. Pancreatic duct mildly dilated in the pancreatic body measuring 7 mm. Spleen: No focal abnormality.  Normal size. Adrenals/Urinary Tract: Bilateral renal low-density lesions are difficult to characterize on this noncontrast study. 1.9 cm low-density lesion off the midpole of the left kidney. 2 cm low-density lesion off the lower pole of the right kidney. No stones or hydronephrosis. Adrenal glands unremarkable. Stomach/Bowel: Stomach is moderately distended with gas. Feeding tube tip is in the distal stomach. Stomach is noted along the anterior abdominal wall. Large amount of stool throughout the colon with moderate gaseous distension. The colon does not overlie/cover the stomach. No evidence of bowel obstruction. Vascular/Lymphatic: Diffuse aortoiliac atherosclerosis. No evidence of aneurysm or adenopathy. Other: No free fluid or free air. Musculoskeletal: No acute bony abnormality. IMPRESSION: Mild gaseous distention of the stomach which lies just under the anterior abdominal wall. No: Overlies or covers the stomach. Feeding tube tip in the distal stomach. Moderate stool burden and gaseous distention of the  colon. Changes of chronic pancreatitis. Coronary artery disease.  Aortoiliac atherosclerosis. Electronically Signed   By: Charlett Nose M.D.   On: 03/25/2023 19:38    Medications: Infusions:  sodium chloride Stopped (03/13/23 1623)   anticoagulant sodium citrate      ceFAZolin (ANCEF) IV 2 g (03/25/23 1801)   feeding supplement (NEPRO CARB STEADY) 1,000 mL (03/27/23 0321)    Scheduled Medications:  sodium chloride   Intravenous Once   atorvastatin  80 mg Per Tube Daily   Chlorhexidine Gluconate Cloth  6 each Topical Q0600   [START ON 03/29/2023] darbepoetin (ARANESP) injection - DIALYSIS  100 mcg Subcutaneous Q Sat-1800   famotidine  10 mg Per Tube Daily   insulin aspart  0-15 Units Subcutaneous Q4H   insulin glargine-yfgn  15 Units Subcutaneous QHS   loratadine  10 mg  Per Tube Daily   multivitamin  1 tablet Per Tube QHS   mouth rinse  15 mL Mouth Rinse 4 times per day   polyethylene glycol  17 g Per Tube Daily   thiamine  100 mg Per Tube Daily    have reviewed scheduled and prn medications.  Physical Exam: General:NAD, comfortable, NG tube in place. Heart:RRR, s1s2 nl Lungs:clear b/l, no crackle Abdomen:soft, Non-tender, non-distended Extremities:No edema Dialysis Access: Right upper extremity AV fistula has good thrill and bruit.  Susan Evans Susan Evans Susan Evans 03/27/2023,11:15 AM  LOS: 16 days

## 2023-03-27 NOTE — Progress Notes (Signed)
Overnight events: Hemoglobin dropped from 7.0 to 5.3 overnight; 1 unit pRBC given.   Subjective: Patient states she is doing well this morning.  She had 1 bowel movement yesterday and denies any blood in her stool.  She says she has not been bleeding at all.  No back pain, no abdominal pain, no change in respiratory status. On 3L nasal cannula.   Objective:  Vital signs in last 24 hours: Vitals:   03/27/23 1100 03/27/23 1200 03/27/23 1259 03/27/23 1300  BP: 111/60 120/73 120/73 (!) 144/66  Pulse: 98 96 88 98  Resp: 18  18   Temp: 98.2 F (36.8 C)  98 F (36.7 C)   TempSrc: Oral  Oral   SpO2: 95% 92% 96% 98%  Weight:      Height:        Physical Exam: Constitutional: Alert and oriented, sitting in chair Cardio: Regular rate and rhythm, no murmurs Pulm: Clear to auscultation bilaterally Abdomen: Flat and soft; non-distended; no tenderness to palpation Skin: No skin lesions or abnormalities noted Neuro: Alert and oriented; no new focal deficits Psych: Normal mood and affect  Labs:  CBC: Hemoglobin 5.3 overnight, hematocrit 16.4. Transfused. MCV 83.7.  White blood cells elevated at 15.7, up from 13.3.  BMP: Bicarb 32.  BUN and creatinine significantly elevated as expected in the setting of ESRD.  Albumin 2.5.  Glucose 151.  Assessment/Plan:  Principal Problem:   Bacteremia due to group B Streptococcus Active Problems:   Type 2 diabetes mellitus with complication (HCC)   Essential hypertension   ESRD (end stage renal disease) (HCC)   Acute hypoxic respiratory failure (HCC)   Microcytic anemia   Malignant neoplasm of pharynx, unspecified (HCC)   Protein-calorie malnutrition, severe   Dysphagia, pharyngoesophageal phase   Patient Summary: Susan Evans is a 67 year old female with a past medical history of hypertension, diabetes, ESRD on dialysis TTS, who presented with angioedema edema that required intubation.  She was subsequently found to have group B strep  bacteremia, which is being treated with IV cefazolin.  She continues to experience dysphagia with evidence of aspiration on swallow studies, she is scheduled for PEG tube placement with general surgery on 7/5.  She is now showing acute on chronic normocytic anemia.  Acute on chronic normocytic anemia Patient has chronic anemia due to renal disease, but hemoglobin acutely dropped to 5.3 from 7.0 yesterday.  Patient denies blood in stool.  No other apparent source of bleeding on exam.  No back pain, abdominal pain.  1 unit pRBC transfused.  With no apparent source of bleeding, we are investigating other causes of her acute drop in hemoglobin. -Haptoglobin, LDH, reticulocytes -Hepatic function panel -Repeat CBC -If hemoglobin not improved tomorrow morning, will reach out to surgery about rescheduling PEG tube placement -Hold DVT prophylaxis for concerns of bleeding  Group B strep bacteremia White blood cells increased to 15.7 from 13.3 yesterday. Patient continues to be afebrile with no symptoms.   -Continue IV cefazolin until 04/23/2023 -Trend CBC   Angioedema History of head and neck cancer, s/p radiation therapy Dysphagia Patient and family have agreed to proceed with PEG tube. Procedure scheduled for 7/5 with general surgery. Patient will need to be NPO at midnight.  May need to reschedule if hemoglobin not improved tomorrow morning. -PEG tube placement scheduled for 7/5 with general surgery -Speech following  ESRD on HD TTS  No electrolyte abnormalities.  Scheduled for hemodialysis today. -Trend BMP -Dialysis TTS  Type 2 diabetes mellitus  -  Continue Semglee to 15 units at bedtime, remain on SSI -CBG monitoring  Carotid Stenosis Hypertension -Continue atorvastatin 80 mg daily -Hold aspirin 81 mg daily -Hold hydralazine and carvedilol  Diet: NPO (ice chip exception), CorTrak w/ TF IVF: None VTE: None Code: Full  Dispo: Anticipated discharge to  Home with home health  pending  medical stability and safe dispo plan.   Annett Fabian, MD 03/27/2023, 1:46 PM Pager: [phone] After 5pm on weekdays and 1pm on weekends: On Call pager [phone]

## 2023-03-27 NOTE — Progress Notes (Signed)
Occupational Therapy Treatment Patient Details Name: Susan Evans MRN: [id number] DOB: 24-Dec-1955 Today's Date: 03/27/2023   History of present illness 67 yo female presents to [facility] on 6/18 with lip swelling with subglottic edema/ compromised airway, s/p fiberoptic intubation 6/18-6/24. Other dx includes bacteremia. Dysphagia with cortrak. 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   OT comments  Pt currently still at min assist level for toilet transfers using the RW and functional mobility in the room.  HR increasing from the low 100s up to 108 during activity.  Oxygen sats 89-94% on 5Ls nasal cannula.  Pt somewhat sleepy this morning likely secondary to hemoglobin being low but BP 116/83 in sitting.  Feel she will continue to benefit from acute care OT at this time to progress toward supervision/modified independent level goals.  Will continue to follow.   Recommendations for follow up therapy are one component of a multi-disciplinary discharge planning process, led by the attending physician.  Recommendations may be updated based on patient status, additional functional criteria and insurance authorization.    Assistance Recommended at Discharge Intermittent Supervision/Assistance  Patient can return home with the following  A little help with walking and/or transfers;A little help with bathing/dressing/bathroom;Assistance with cooking/housework;Assist for transportation;Help with stairs or ramp for entrance;Direct supervision/assist for medications management;Direct supervision/assist for financial management   Equipment Recommendations  Tub/shower seat       Precautions / Restrictions Precautions Precautions: Fall Precaution Comments: NPO, cortrak Restrictions Weight Bearing Restrictions: No       Mobility Bed Mobility Overal bed mobility: Needs Assistance Bed Mobility:  Supine to Sit     Supine to sit: Min assist     General bed mobility comments: Min assist to bring trunk up to sitting    Transfers Overall transfer level: Needs assistance Equipment used: Rolling walker (2 wheels) Transfers: Sit to/from Stand Sit to Stand: Min assist     Step pivot transfers: Min assist     General transfer comment: Mod instructional cueing for hand placement sit to stand.     Balance Overall balance assessment: Needs assistance Sitting-balance support: No upper extremity supported, Feet supported Sitting balance-Leahy Scale: Fair     Standing balance support: No upper extremity supported, During functional activity Standing balance-Leahy Scale: Fair                             ADL either performed or assessed with clinical judgement   ADL Overall ADL's : Needs assistance/impaired     Grooming: Wash/dry hands;Sitting;Set up Grooming Details (indicate cue type and reason): setup pt declined standing at the sink         Upper Body Dressing : Moderate assistance;Sitting Upper Body Dressing Details (indicate cue type and reason): to donn hospital gown like a robe     Toilet Transfer: Minimal assistance;Stand-pivot;Rolling walker (2 wheels)   Toileting- Clothing Manipulation and Hygiene: Minimal assistance;Sit to/from stand Toileting - Clothing Manipulation Details (indicate cue type and reason): for clothing management     Functional mobility during ADLs: Minimal assistance;Rolling walker (2 wheels) General ADL Comments: Pts O2 sats from 89-94% with activity on 5Ls nasal cannula.  HR in the low 100s at rest increasing to 108 with activity.  BP in sitting at 116/83.      Cognition Arousal/Alertness: Lethargic Behavior During Therapy: WFL for tasks assessed/performed (slight agitation with having  to get up but easily re-directed) Overall Cognitive Status: No family/caregiver present to determine baseline cognitive functioning                                  General Comments: Pt with decreased memory requiring mod demonstrational cueing for hand positioning with sit to stand.                   Pertinent Vitals/ Pain       Pain Assessment Pain Assessment: No/denies pain         Frequency  Min 2X/week        Progress Toward Goals  OT Goals(current goals can now be found in the care plan section)  Progress towards OT goals: Progressing toward goals  Acute Rehab OT Goals Patient Stated Goal: Pt did not state this session OT Goal Formulation: With patient Time For Goal Achievement: 04/03/23 Potential to Achieve Goals: Good  Plan Discharge plan remains appropriate       AM-PAC OT "6 Clicks" Daily Activity     Outcome Measure   Help from another person eating meals?: Total Help from another person taking care of personal grooming?: A Little Help from another person toileting, which includes using toliet, bedpan, or urinal?: A Little Help from another person bathing (including washing, rinsing, drying)?: A Little Help from another person to put on and taking off regular upper body clothing?: A Little Help from another person to put on and taking off regular lower body clothing?: A Little 6 Click Score: 16    End of Session Equipment Utilized During Treatment: Gait belt  OT Visit Diagnosis: Unsteadiness on feet (R26.81);Muscle weakness (generalized) (M62.81)   Activity Tolerance Patient tolerated treatment well   Patient Left in chair;with call bell/phone within reach;with chair alarm set   Nurse Communication Mobility status;Precautions        Time: [phone] OT Time Calculation (min): 43 min  Charges: OT General Charges $OT Visit: 1 Visit OT Treatments $Self Care/Home Management : 38-52 mins  Perrin Maltese, OTR/L Acute Rehabilitation Services  Office [phone] 03/27/2023

## 2023-03-27 NOTE — Progress Notes (Signed)
Date and time results received: 03/27/23 0500  Test: Hemoglobin Critical Value: 5.3  Name of Provider Notified: Dr. Merrilee Jansky  Orders Received- 2 units of blood

## 2023-03-28 ENCOUNTER — Encounter (HOSPITAL_COMMUNITY)
Admission: EM | Disposition: E | Payer: Self-pay | Source: Home / Self Care | Attending: Student in an Organized Health Care Education/Training Program

## 2023-03-28 ENCOUNTER — Inpatient Hospital Stay (HOSPITAL_COMMUNITY): Payer: 59 | Admitting: Anesthesiology

## 2023-03-28 ENCOUNTER — Encounter (HOSPITAL_COMMUNITY): Payer: Self-pay | Admitting: Otolaryngology

## 2023-03-28 DIAGNOSIS — R1310 Dysphagia, unspecified: Secondary | ICD-10-CM | POA: Diagnosis not present

## 2023-03-28 DIAGNOSIS — I120 Hypertensive chronic kidney disease with stage 5 chronic kidney disease or end stage renal disease: Secondary | ICD-10-CM

## 2023-03-28 DIAGNOSIS — R7881 Bacteremia: Secondary | ICD-10-CM | POA: Diagnosis not present

## 2023-03-28 DIAGNOSIS — Z87891 Personal history of nicotine dependence: Secondary | ICD-10-CM

## 2023-03-28 DIAGNOSIS — B951 Streptococcus, group B, as the cause of diseases classified elsewhere: Secondary | ICD-10-CM | POA: Diagnosis not present

## 2023-03-28 DIAGNOSIS — E1122 Type 2 diabetes mellitus with diabetic chronic kidney disease: Secondary | ICD-10-CM | POA: Diagnosis not present

## 2023-03-28 DIAGNOSIS — N186 End stage renal disease: Secondary | ICD-10-CM

## 2023-03-28 HISTORY — PX: PEG PLACEMENT: SHX5437

## 2023-03-28 LAB — TYPE AND SCREEN
Unit division: 0
Unit division: 0

## 2023-03-28 LAB — GLUCOSE, CAPILLARY
Glucose-Capillary: 117 mg/dL — ABNORMAL HIGH (ref 70–99)
Glucose-Capillary: 144 mg/dL — ABNORMAL HIGH (ref 70–99)
Glucose-Capillary: 55 mg/dL — ABNORMAL LOW (ref 70–99)
Glucose-Capillary: 124 mg/dL — ABNORMAL HIGH (ref 70–99)
Glucose-Capillary: 164 mg/dL — ABNORMAL HIGH (ref 70–99)
Glucose-Capillary: 197 mg/dL — ABNORMAL HIGH (ref 70–99)
Glucose-Capillary: 266 mg/dL — ABNORMAL HIGH (ref 70–99)
Glucose-Capillary: 246 mg/dL — ABNORMAL HIGH (ref 70–99)

## 2023-03-28 LAB — RENAL FUNCTION PANEL
Albumin: 2.4 g/dL — ABNORMAL LOW (ref 3.5–5.0)
Albumin: 2.5 g/dL — ABNORMAL LOW (ref 3.5–5.0)
Anion gap: 8 (ref 5–15)
Anion gap: 11 (ref 5–15)
BUN: 57 mg/dL — ABNORMAL HIGH (ref 8–23)
BUN: 71 mg/dL — ABNORMAL HIGH (ref 8–23)
CO2: 30 mmol/L (ref 22–32)
CO2: 26 mmol/L (ref 22–32)
Calcium: 7.7 mg/dL — ABNORMAL LOW (ref 8.9–10.3)
Calcium: 7.8 mg/dL — ABNORMAL LOW (ref 8.9–10.3)
Chloride: 92 mmol/L — ABNORMAL LOW (ref 98–111)
Chloride: 92 mmol/L — ABNORMAL LOW (ref 98–111)
Creatinine, Ser: 2.86 mg/dL — ABNORMAL HIGH (ref 0.44–1.00)
Creatinine, Ser: 3.43 mg/dL — ABNORMAL HIGH (ref 0.44–1.00)
GFR, Estimated: 18 mL/min — ABNORMAL LOW (ref >60)
GFR, Estimated: 14 mL/min — ABNORMAL LOW (ref >60)
Glucose, Bld: 123 mg/dL — ABNORMAL HIGH (ref 70–99)
Glucose, Bld: 188 mg/dL — ABNORMAL HIGH (ref 70–99)
Phosphorus: 2.3 mg/dL — ABNORMAL LOW (ref 2.5–4.6)
Phosphorus: 3.3 mg/dL (ref 2.5–4.6)
Potassium: 3.6 mmol/L (ref 3.5–5.1)
Potassium: 4.6 mmol/L (ref 3.5–5.1)
Sodium: 130 mmol/L — ABNORMAL LOW (ref 135–145)
Sodium: 129 mmol/L — ABNORMAL LOW (ref 135–145)

## 2023-03-28 LAB — CBC
HCT: 24.5 % — ABNORMAL LOW (ref 36.0–46.0)
Hemoglobin: 8.2 g/dL — ABNORMAL LOW (ref 12.0–15.0)
MCH: 28.2 pg (ref 26.0–34.0)
MCHC: 33.5 g/dL (ref 30.0–36.0)
MCV: 84.2 fL (ref 80.0–100.0)
Platelets: 187 10*3/uL (ref 150–400)
RBC: 2.91 MIL/uL — ABNORMAL LOW (ref 3.87–5.11)
RDW: 15.5 % (ref 11.5–15.5)
WBC: 15.7 10*3/uL — ABNORMAL HIGH (ref 4.0–10.5)
nRBC: 0.8 % — ABNORMAL HIGH (ref 0.0–0.2)

## 2023-03-28 LAB — BPAM RBC
ISSUE DATE / TIME: 202407041710
Unit Type and Rh: 6200

## 2023-03-28 SURGERY — INSERTION, PEG TUBE
Anesthesia: General | Site: Abdomen

## 2023-03-28 MED ORDER — PHENYLEPHRINE 80 MCG/ML (10ML) SYRINGE FOR IV PUSH (FOR BLOOD PRESSURE SUPPORT)
PREFILLED_SYRINGE | INTRAVENOUS | Status: DC | PRN
  Administered 2023-03-28: 80 ug via INTRAVENOUS
  Administered 2023-03-28: 160 ug via INTRAVENOUS
  Administered 2023-03-28: 80 ug via INTRAVENOUS

## 2023-03-28 MED ORDER — ROCURONIUM BROMIDE 10 MG/ML (PF) SYRINGE
PREFILLED_SYRINGE | INTRAVENOUS | Status: DC | PRN
  Administered 2023-03-28: 30 mg via INTRAVENOUS

## 2023-03-28 MED ORDER — LIDOCAINE 2% (20 MG/ML) 5 ML SYRINGE
INTRAMUSCULAR | Status: DC | PRN
  Administered 2023-03-28: 40 mg via INTRAVENOUS

## 2023-03-28 MED ORDER — SUGAMMADEX SODIUM 200 MG/2ML IV SOLN
INTRAVENOUS | Status: DC | PRN
  Administered 2023-03-28: 100 mg via INTRAVENOUS

## 2023-03-28 MED ORDER — STERILE WATER FOR IRRIGATION IR SOLN
Status: DC | PRN
  Administered 2023-03-28: 3000 mL

## 2023-03-28 MED ORDER — PENTAFLUOROPROP-TETRAFLUOROETH EX AERO: 1.0000 | INHALATION_SPRAY | CUTANEOUS | Status: DC | PRN

## 2023-03-28 MED ORDER — ANTICOAGULANT SODIUM CITRATE 4% (200MG/5ML) IV SOLN: 5.0000 mL | Status: DC | PRN

## 2023-03-28 MED ORDER — LIDOCAINE HCL (PF) 1 % IJ SOLN: 5.0000 mL | INTRAMUSCULAR | Status: DC | PRN

## 2023-03-28 MED ORDER — LIDOCAINE HCL (PF) 1 % IJ SOLN
INTRAMUSCULAR | Status: DC | PRN
  Administered 2023-03-28: 1.5 mL

## 2023-03-28 MED ORDER — PROPOFOL 10 MG/ML IV BOLUS
INTRAVENOUS | Status: DC | PRN
  Administered 2023-03-28: 50 mg via INTRAVENOUS

## 2023-03-28 MED ORDER — ONDANSETRON HCL 4 MG/2ML IJ SOLN
INTRAMUSCULAR | Status: AC
  Filled 2023-03-28: qty 2

## 2023-03-28 MED ORDER — DEXAMETHASONE SODIUM PHOSPHATE 10 MG/ML IJ SOLN
INTRAMUSCULAR | Status: DC | PRN
  Administered 2023-03-28: 10 mg via INTRAVENOUS

## 2023-03-28 MED ORDER — MIDAZOLAM HCL 2 MG/2ML IJ SOLN
INTRAMUSCULAR | Status: DC | PRN
  Administered 2023-03-28: 1 mg via INTRAVENOUS

## 2023-03-28 MED ORDER — HEPARIN SODIUM (PORCINE) 5000 UNIT/ML IJ SOLN
5000.0000 [IU] | Freq: Three times a day (TID) | INTRAMUSCULAR | Status: AC
  Administered 2023-03-28 – 2023-03-30 (×7): 5000 [IU] via SUBCUTANEOUS
  Filled 2023-03-28 (×7): qty 1

## 2023-03-28 MED ORDER — LIDOCAINE 2% (20 MG/ML) 5 ML SYRINGE
INTRAMUSCULAR | Status: AC
  Filled 2023-03-28: qty 5

## 2023-03-28 MED ORDER — MIDAZOLAM HCL 2 MG/2ML IJ SOLN
INTRAMUSCULAR | Status: AC
  Filled 2023-03-28: qty 2

## 2023-03-28 MED ORDER — LIDOCAINE-PRILOCAINE 2.5-2.5 % EX CREA: 1.0000 | TOPICAL_CREAM | CUTANEOUS | Status: DC | PRN

## 2023-03-28 MED ORDER — HEPARIN SODIUM (PORCINE) 1000 UNIT/ML DIALYSIS
20.0000 [IU]/kg | INTRAMUSCULAR | Status: DC | PRN
  Administered 2023-03-29: 800 [IU] via INTRAVENOUS_CENTRAL
  Filled 2023-03-28 (×2): qty 1

## 2023-03-28 MED ORDER — ALTEPLASE 2 MG IJ SOLR: 2.0000 mg | Freq: Once | INTRAMUSCULAR | Status: DC | PRN

## 2023-03-28 MED ORDER — PROPOFOL 10 MG/ML IV BOLUS
INTRAVENOUS | Status: AC
  Filled 2023-03-28: qty 20

## 2023-03-28 MED ORDER — PHENYLEPHRINE 80 MCG/ML (10ML) SYRINGE FOR IV PUSH (FOR BLOOD PRESSURE SUPPORT)
PREFILLED_SYRINGE | INTRAVENOUS | Status: AC
  Filled 2023-03-28: qty 10

## 2023-03-28 MED ORDER — FENTANYL CITRATE (PF) 100 MCG/2ML IJ SOLN
INTRAMUSCULAR | Status: DC | PRN
  Administered 2023-03-28: 50 ug via INTRAVENOUS

## 2023-03-28 MED ORDER — HYDROMORPHONE HCL 1 MG/ML IJ SOLN
0.5000 mg | Freq: Three times a day (TID) | INTRAMUSCULAR | Status: DC | PRN
  Administered 2023-03-28 – 2023-03-30 (×4): 0.5 mg via INTRAVENOUS
  Filled 2023-03-28 (×4): qty 0.5

## 2023-03-28 MED ORDER — ORAL CARE MOUTH RINSE: 15.0000 mL | Freq: Once | OROMUCOSAL | Status: AC

## 2023-03-28 MED ORDER — SODIUM CHLORIDE 0.9 % IV SOLN: INTRAVENOUS | Status: DC

## 2023-03-28 MED ORDER — HEPARIN SODIUM (PORCINE) 1000 UNIT/ML DIALYSIS: 1000.0000 [IU] | INTRAMUSCULAR | Status: DC | PRN

## 2023-03-28 MED ORDER — FENTANYL CITRATE (PF) 250 MCG/5ML IJ SOLN
INTRAMUSCULAR | Status: AC
  Filled 2023-03-28: qty 5

## 2023-03-28 MED ORDER — DEXTROSE 50 % IV SOLN
INTRAVENOUS | Status: AC
  Administered 2023-03-28: 12.5 g via INTRAVENOUS
  Filled 2023-03-28: qty 50

## 2023-03-28 MED ORDER — DEXAMETHASONE SODIUM PHOSPHATE 10 MG/ML IJ SOLN
INTRAMUSCULAR | Status: AC
  Filled 2023-03-28: qty 1

## 2023-03-28 MED ORDER — ONDANSETRON HCL 4 MG/2ML IJ SOLN
INTRAMUSCULAR | Status: DC | PRN
  Administered 2023-03-28: 4 mg via INTRAVENOUS

## 2023-03-28 MED ORDER — ROCURONIUM BROMIDE 10 MG/ML (PF) SYRINGE
PREFILLED_SYRINGE | INTRAVENOUS | Status: AC
  Filled 2023-03-28: qty 10

## 2023-03-28 MED ORDER — CHLORHEXIDINE GLUCONATE 0.12 % MT SOLN
OROMUCOSAL | Status: AC
  Administered 2023-03-28: 15 mL via OROMUCOSAL
  Filled 2023-03-28: qty 15

## 2023-03-28 MED ORDER — CHLORHEXIDINE GLUCONATE 0.12 % MT SOLN: 15.0000 mL | Freq: Once | OROMUCOSAL | Status: AC

## 2023-03-28 MED ORDER — PHENYLEPHRINE HCL-NACL 20-0.9 MG/250ML-% IV SOLN
INTRAVENOUS | Status: DC | PRN
  Administered 2023-03-28: 40 ug/min via INTRAVENOUS

## 2023-03-28 SURGICAL SUPPLY — 12 items
BINDER ABDOMINAL 12 SM 30-45 (SOFTGOODS) IMPLANT
BINDER BREAST MEDIUM (GAUZE/BANDAGES/DRESSINGS) IMPLANT
BLOCK BITE 60FR ADLT L/F BLUE (MISCELLANEOUS) ×1 IMPLANT
BUTTON OLYMPUS DEFENDO 5 PIECE (MISCELLANEOUS) ×1 IMPLANT
CANISTER SUCT 3000ML PPV (MISCELLANEOUS) ×1 IMPLANT
KIT CLEAN ENDO COMPLIANCE (KITS) ×1 IMPLANT
KIT PEG 24FR PULL ENFIT (KITS) ×1
KIT PG 24FR PUL EVV ENFIT (KITS) ×1 IMPLANT
KIT TURNOVER KIT B (KITS) ×1 IMPLANT
TUBE CONNECTING 12X1/4 (SUCTIONS) ×1 IMPLANT
TUBING ENDO SMARTCAP (MISCELLANEOUS) ×1 IMPLANT
WATER STERILE IRR 1000ML POUR (IV SOLUTION) ×1 IMPLANT

## 2023-03-28 NOTE — Progress Notes (Signed)
Overnight events:  Hemoglobin 6.7 yesterday afternoon.  1 unit packed red blood cells given.  Post transfusion hemoglobin 8.8 at 2030.   Subjective: Patient had PEG tube placed today by general surgery.  On 2L [place]. Family is present at bedside.  Patient is doing well following the procedure.  Family says she has 6 sisters in the area who can help her out, and someone can be with her at home to help her upon discharge.  Objective:  Vital signs in last 24 hours: Vitals:   04/23/2023 1118 04/18/2023 1124 04/18/2023 1137 04/01/2023 1150  BP:   (!) 101/58 (!) 149/67  Pulse: 97   90  Resp: 17   18  Temp:    (!) 97.4 F (36.3 C)  TempSrc:    Oral  SpO2: 96% 93% 93% 91%  Weight:      Height:        Physical Exam: Constitutional: Comfortable, lying in bed Cardio: Regular rate and rhythm Abdomen: PEG tube noted; dressing dry, no blood or discharge  Labs:  CBC: Hemoglobin 8.2, white blood cells 15.7.  BMP: No acute electrolyte abnormalities.   Reticulocyte panel: Reticulocyte Pct 3.8. Reticulocyte index calculated to be 0.72.    LDH 212.    Total bili 0.2.  Assessment/Plan:  Principal Problem:   Bacteremia due to group B Streptococcus Active Problems:   Type 2 diabetes mellitus with complication (HCC)   Essential hypertension   ESRD (end stage renal disease) (HCC)   Acute hypoxic respiratory failure (HCC)   Microcytic anemia   Malignant neoplasm of pharynx, unspecified (HCC)   Protein-calorie malnutrition, severe   Dysphagia, pharyngoesophageal phase   Patient Summary: Susan Evans is a 67 year old female with a past medical history of hypertension, diabetes, and ESRD on dialysis TTS, who was admitted for angioedema requiring intubation, and was subsequently found to have group B strep bacteremia which is being treated with IV cefazolin.  She has had continued dysphagia throughout her hospitalization and a PEG tube was placed on 7/5. If she tolerates this transition well, and  her hemoglobin levels remain stable, she can continue her Cefazolin outpatient at dialysis.   Dysphagia s/p PEG tube placement Patient has had continued dysphagia throughout admission, likely secondary to fibrosis from her previous radiation.  Swallow team has been following.  PEG tube was placed on 7/5 by general surgery. -Per surgery, patient may have water and medications administered via the PEG tube beginning immediately -Tube feeds may be initiated 4 hours after surgery -Holding any anticoagulation until 4:30pm today per surgery note  Group B Strep Bacteremia White blood cells fluctuating, back up to 15.7 from 12.8 yesterday.  Afebrile.  -Continue IV cefazolin until 04/05/2023 -Trend CBC daily  ESRD on HD TTS Chronic normocytic anemia Hemoglobin remained low at 6.7 after transfusion of 1 unit pRBC yesterday, so another unit of pRBC was transfused overnight.  Post-transfusion hemoglobin was 8.8.  Hemoglobin is 8.2 this morning.  Reticulocyte index of 0.72 indicates hypoproduction of RBCs by the bone marrow, which is expected given ESRD and low epo.  We do not see any signs of active bleeding.  LDH and bilirubin were not concerning for hemolysis.  If hemoglobin continues to drop, we will consider other causes of hypoproduction.  -Trend CBC, BMP daily -If hemoglobin less than 7, transfuse -HD TTS  Diet: NPO; tube feeds IVF: None VTE: Held Code: Full   Dispo: Anticipated discharge to  Home pending medical stability and safe dispo plan.   Versie Starks,  Casimiro Needle, MD 04/03/2023, 1:08 PM Pager: [phone] After 5pm on weekdays and 1pm on weekends: On Call pager [phone]

## 2023-03-28 NOTE — Anesthesia Postprocedure Evaluation (Signed)
Anesthesia Post Note  Patient: [name]  Procedure(s) Performed: PERCUTANEOUS ENDOSCOPIC GASTROSTOMY (PEG) PLACEMENT (Abdomen)     Patient location during evaluation: PACU Anesthesia Type: General Level of consciousness: awake and alert Pain management: pain level controlled Vital Signs Assessment: post-procedure vital signs reviewed and stable Respiratory status: spontaneous breathing, nonlabored ventilation, respiratory function stable and patient connected to nasal cannula oxygen Cardiovascular status: blood pressure returned to baseline and stable Postop Assessment: no apparent nausea or vomiting Anesthetic complications: no   No notable events documented.  Last Vitals:  Vitals:   04/16/2023 1137 04/02/2023 1150  BP: (!) 101/58 (!) 149/67  Pulse:  90  Resp:  18  Temp:  (!) 36.3 C  SpO2: 93% 91%    Last Pain:  Vitals:   04/02/2023 1150  TempSrc: Oral  PainSc:                  Susan Evans

## 2023-03-28 NOTE — Progress Notes (Signed)
Report given to OR, pt going down for peg tube placement.

## 2023-03-28 NOTE — Anesthesia Preprocedure Evaluation (Addendum)
Anesthesia Evaluation  Patient identified by MRN, date of birth, ID band Patient awake    Reviewed: Allergy & Precautions, NPO status , Patient's Chart, lab work & pertinent test results  History of Anesthesia Complications Negative for: history of anesthetic complications  Airway Mallampati: III  TM Distance: >3 FB Neck ROM: Full    Dental  (+) Edentulous Upper   Pulmonary former smoker  Respiratory failure s/p fiberoptic intubation 6/18, since extubated, now on [place] O2    Pulmonary exam normal        Cardiovascular hypertension, Pt. on medications Normal cardiovascular exam     Neuro/Psych negative neurological ROS  negative psych ROS   GI/Hepatic Neg liver ROS,GERD  Medicated and Controlled,,  Endo/Other  diabetes, Type 2 Hyperthyroidism  Na 130 Ca 7.7 Cl 92   Renal/GU ESRF and DialysisRenal disease     Musculoskeletal negative musculoskeletal ROS (+)    Abdominal   Peds  Hematology  (+) Blood dyscrasia, anemia   Anesthesia Other Findings Malnutrition Head/neck cancer s/p radiation   Reproductive/Obstetrics                             Anesthesia Physical Anesthesia Plan  ASA: 4  Anesthesia Plan: General   Post-op Pain Management:    Induction: Intravenous  PONV Risk Score and Plan: 3 and Treatment may vary due to age or medical condition, Ondansetron and Propofol infusion  Airway Management Planned: Oral ETT and Video Laryngoscope Planned  Additional Equipment: None  Intra-op Plan:   Post-operative Plan: Extubation in OR  Informed Consent: I have reviewed the patients History and Physical, chart, labs and discussed the procedure including the risks, benefits and alternatives for the proposed anesthesia with the patient or authorized representative who has indicated his/her understanding and acceptance.     Dental advisory given  Plan Discussed with: CRNA and  Anesthesiologist  Anesthesia Plan Comments:        Anesthesia Quick Evaluation

## 2023-03-28 NOTE — Anesthesia Procedure Notes (Addendum)
Procedure Name: Intubation Date/Time: 04/16/2023 10:05 AM  Performed by: [name], MDPre-anesthesia Checklist: Patient identified, Emergency Drugs available, Suction available and Patient being monitored Patient Re-evaluated:Patient Re-evaluated prior to induction Oxygen Delivery Method: Circle system utilized Preoxygenation: Pre-oxygenation with 100% oxygen Induction Type: IV induction Ventilation: Mask ventilation with difficulty Laryngoscope Size: Glidescope and 3 Grade View: Grade III Tube type: Oral Tube size: 6.5 mm Number of attempts: 1 Airway Equipment and Method: Video-laryngoscopy and Rigid stylet Placement Confirmation: ETT inserted through vocal cords under direct vision, positive ETCO2 and breath sounds checked- equal and bilateral Secured at: 20 cm Tube secured with: Tape Dental Injury: Teeth and Oropharynx as per pre-operative assessment  Difficulty Due To: Difficulty was anticipated and Difficult Airway- due to immobile epiglottis Future Recommendations: Recommend- awake intubation and Recommend- induction with short-acting agent, and alternative techniques readily available Comments: Patient difficult to mask ventilate, jaw fixed related to hx radiation therapy. Did not attempt to place oral airway or two hand mask, though suspect this would've been difficult also. Quickly moved to intubate with glidescope. Grade 3 view. Immobile epiglottis, unable to lift to view even arytenoid cartilage. Able to place ETT blindly on first attempt. Would recommend fiberoptic intubation in the future, strongly consider awake intubation given mask ventilating difficulties.

## 2023-03-28 NOTE — Progress Notes (Signed)
Trauma/Critical Care Follow Up Note  Subjective:    Overnight Issues:   Objective:  Vital signs for last 24 hours: Temp:  [97.7 F (36.5 C)-98.9 F (37.2 C)] 97.8 F (36.6 C) (07/05 0847) Pulse Rate:  [88-105] 91 (07/05 0847) Resp:  [17-21] 17 (07/05 0847) BP: (93-146)/(52-92) 140/61 (07/05 0847) SpO2:  [92 %-100 %] 93 % (07/05 0847) Weight:  [38.6 kg-39.3 kg] 39.3 kg (07/05 0420)  Hemodynamic parameters for last 24 hours:    Intake/Output from previous day: 07/04 0701 - 07/05 0700 In: 4483.1 [I.V.:50; WGNFA:2130; QM/VH:8469; IV Piggyback:197.1] Out: 1500   Intake/Output this shift: No intake/output data recorded.  Vent settings for last 24 hours:    Physical Exam:  Gen: comfortable, no distress Neuro: follows commands, alert, communicative HEENT: PERRL Neck: supple CV: RRR Pulm: unlabored breathing on [place] Abd: soft, NT    GU: urine clear and yellow  Extr: wwp, no edema  Results for orders placed or performed during the hospital encounter of 03/14/2023 (from the past 24 hour(s))  Glucose, capillary     Status: Abnormal   Collection Time: 03/27/23 11:28 AM  Result Value Ref Range   Glucose-Capillary 220 (H) 70 - 99 mg/dL  CBC     Status: Abnormal   Collection Time: 03/27/23  3:38 PM  Result Value Ref Range   WBC 12.8 (H) 4.0 - 10.5 K/uL   RBC 2.38 (L) 3.87 - 5.11 MIL/uL   Hemoglobin 6.7 (LL) 12.0 - 15.0 g/dL   HCT 62.9 (L) 52.8 - 41.3 %   MCV 83.6 80.0 - 100.0 fL   MCH 28.2 26.0 - 34.0 pg   MCHC 33.7 30.0 - 36.0 g/dL   RDW 24.4 (H) 01.0 - 27.2 %   Platelets 196 150 - 400 K/uL   nRBC 0.9 (H) 0.0 - 0.2 %  Retic Panel     Status: Abnormal   Collection Time: 03/27/23  3:38 PM  Result Value Ref Range   Retic Ct Pct 3.8 (H) 0.4 - 3.1 %   RBC. 2.38 (L) 3.87 - 5.11 MIL/uL   Retic Count, Absolute 89.5 19.0 - 186.0 K/uL   Immature Retic Fract 45.3 (H) 2.3 - 15.9 %   Reticulocyte Hemoglobin 29.0 >27.9 pg  Hepatic function panel     Status: Abnormal    Collection Time: 03/27/23  3:38 PM  Result Value Ref Range   Total Protein 5.0 (L) 6.5 - 8.1 g/dL   Albumin 2.5 (L) 3.5 - 5.0 g/dL   AST 78 (H) 15 - 41 U/L   ALT 5 0 - 44 U/L   Alkaline Phosphatase 147 (H) 38 - 126 U/L   Total Bilirubin 0.2 (L) 0.3 - 1.2 mg/dL   Bilirubin, Direct <5.3 0.0 - 0.2 mg/dL   Indirect Bilirubin NOT CALCULATED 0.3 - 0.9 mg/dL  Lactate dehydrogenase     Status: Abnormal   Collection Time: 03/27/23  3:38 PM  Result Value Ref Range   LDH 212 (H) 98 - 192 U/L  Prepare RBC (crossmatch)     Status: None   Collection Time: 03/27/23  4:43 PM  Result Value Ref Range   Order Confirmation      ORDER PROCESSED BY BLOOD BANK Performed at [facility], [address], [place], [place] [address]   Glucose, capillary     Status: Abnormal   Collection Time: 03/27/23  8:20 PM  Result Value Ref Range   Glucose-Capillary 192 (H) 70 - 99 mg/dL  Hemoglobin and  hematocrit, blood     Status: Abnormal   Collection Time: 03/27/23  8:30 PM  Result Value Ref Range   Hemoglobin 8.8 (L) 12.0 - 15.0 g/dL   HCT 19.1 (L) 47.8 - 29.5 %  Glucose, capillary     Status: Abnormal   Collection Time: 03/27/23 11:29 PM  Result Value Ref Range   Glucose-Capillary 231 (H) 70 - 99 mg/dL  Renal function panel     Status: Abnormal   Collection Time: 04/02/2023  3:35 AM  Result Value Ref Range   Sodium 130 (L) 135 - 145 mmol/L   Potassium 3.6 3.5 - 5.1 mmol/L   Chloride 92 (L) 98 - 111 mmol/L   CO2 30 22 - 32 mmol/L   Glucose, Bld 123 (H) 70 - 99 mg/dL   BUN 57 (H) 8 - 23 mg/dL   Creatinine, Ser 6.21 (H) 0.44 - 1.00 mg/dL   Calcium 7.7 (L) 8.9 - 10.3 mg/dL   Phosphorus 2.3 (L) 2.5 - 4.6 mg/dL   Albumin 2.4 (L) 3.5 - 5.0 g/dL   GFR, Estimated 18 (L) >60 mL/min   Anion gap 8 5 - 15  Glucose, capillary     Status: Abnormal   Collection Time: 03/27/2023  4:24 AM  Result Value Ref Range   Glucose-Capillary 117 (H) 70 - 99 mg/dL  CBC     Status: Abnormal   Collection Time: 04/13/2023  6:59  AM  Result Value Ref Range   WBC 15.7 (H) 4.0 - 10.5 K/uL   RBC 2.91 (L) 3.87 - 5.11 MIL/uL   Hemoglobin 8.2 (L) 12.0 - 15.0 g/dL   HCT 30.8 (L) 65.7 - 84.6 %   MCV 84.2 80.0 - 100.0 fL   MCH 28.2 26.0 - 34.0 pg   MCHC 33.5 30.0 - 36.0 g/dL   RDW 96.2 95.2 - 84.1 %   Platelets 187 150 - 400 K/uL   nRBC 0.8 (H) 0.0 - 0.2 %  Glucose, capillary     Status: Abnormal   Collection Time: 04/07/2023  7:14 AM  Result Value Ref Range   Glucose-Capillary 144 (H) 70 - 99 mg/dL    Assessment & Plan:  Present on Admission:  Bacteremia due to group B Streptococcus  ESRD (end stage renal disease) (HCC)  Essential hypertension  Malignant neoplasm of pharynx, unspecified (HCC)  Type 2 diabetes mellitus with complication (HCC)  Protein-calorie malnutrition, severe  Acute hypoxic respiratory failure (HCC)  Microcytic anemia  (Resolved) Angio-edema    LOS: 17 days   Additional comments:I reviewed the patient's new clinical lab test results.   and I reviewed the patients new imaging test results.    Dysphagia, history of head and neck cancer s/p surgical treatment and chemoradiation in 2011  - tolerating TF via cortrak  - H/o PEG previously, will attempt via prior tract. Informed consent was obtained after detailed explanation of risks, including bleeding, infection, malposition, injury to surrounding structures, and need for conversion to open procedure. All questions answered to the patient's satisfaction.  Angioedema - extubated, no airway concerns currently ESRD on HD TTS HTN DM2 GERD Carotid stenosis    FEN - TF via cortrak, held at [facility] VTE - [facility] ID - Ancef for Streptococcus agalactiae bacteremia Dispo - 4NP    Diamantina Monks, MD Trauma & General Surgery Please use [email or web address] to contact on call provider  03/24/2023  *Care during the described time interval was provided by me. I have reviewed this patient's available data, including medical  history, events of note, physical  examination and test results as part of my evaluation.

## 2023-03-28 NOTE — Addendum Note (Signed)
Addendum  created 04/03/2023 1250 by [name], MD   Child order released for a procedure order, Clinical Note Signed, Intraprocedure Blocks edited, LDA created via procedure documentation, LDA updated via procedure documentation, SmartForm saved

## 2023-03-28 NOTE — Progress Notes (Signed)
[facility] NEPHROLOGY PROGRESS NOTE  Assessment/ Plan: [name]   OP HD: TTS [place]     4h  350/500   36kg  3K/2.5Ca bath  RUA AVF   -Heparin  2000 units initial bolus + 2000 units IV midrun-Decrease dose on discharge-too high for weight.   - last OP HD 6/18, post wt 38.4  - hectorol 2 mcg IV three times per week  - coming off at dry wt most of last 2 wks  # Angioedema: occurred 12/2021 as well.  Both times this has happened have been in the setting of infection but that's the only common link, not sure that it was the same organism anyway.  CT maxillofacial showed soft tissue swelling/ cellulitis w/o abscess.  No sign of AVF infection on exam.  Pt required nasal intubation by ENT. Pt rec'd a course of decadron and pepcid. Pt is already dialyzing on a hypoallergenic dialyzer at OP dialysis for the past year. Will keep this on indefinitely.    #Acute resp failure - was intubated nasally by ENT initially. F/u video laryngoscope done by CCM showed improved swelling of the cords. Pt was extubated successfully.   # Hyperkalemia: Managed with dialysis.  #ESRD - TTS HD.  Next dialysis Saturday   #S agalactiae bacteremia: as above in #1, on cefazolin.  Per pharm and ID she will need IV Ancef 2gm post HD TTS through 7/31. VVS consulted to assess her AVF, they found no signs of infection. Appreciate assistance.   # HTN/ vol: Monitor BP, UF as tolerated.  # Anemia of ESRD: Hemoglobin dropped. No sign of active bleed. Received 2 units prbcs on 7/4.   Continue erythropoietin with HD.  Aranesp 100 q Saturday.   # Metabolic Bone Disease: Phosphorus acceptable, off of binders.    # Dysphagia with history of head and neck cancer status postradiation therapy: Getting tube feeds. Had PEG tube placed 04/11/2023.   Subjective: Went to OR this morning for PEG tube placement. Seen in room with family at bedside, in good spirits. No new complaints.   Objective Vital signs in last  24 hours: Vitals:   03/30/2023 1118 04/21/2023 1124 04/11/2023 1137 04/01/2023 1150  BP:   (!) 101/58 (!) 149/67  Pulse: 97   90  Resp: 17   18  Temp:    (!) 97.4 F (36.3 C)  TempSrc:    Oral  SpO2: 96% 93% 93% 91%  Weight:      Height:       Weight change:   Intake/Output Summary (Last 24 hours) at 04/13/2023 1335 Last data filed at 04/07/2023 1036 Gross per 24 hour  Intake 4170.09 ml  Output 1500 ml  Net 2670.09 ml        Labs: RENAL PANEL Recent Labs  Lab 03/24/23 0404 03/25/23 0440 03/26/23 0251 03/27/23 0357 03/27/23 1538 03/27/2023 0335  NA 135 134* 132* 132*  --  130*  K 4.4 4.4 4.1 4.3  --  3.6  CL 91* 90* 91* 89*  --  92*  CO2 28 30 30  32  --  30  GLUCOSE 207* 241* 184* 151*  --  123*  BUN 83* 110* 84* 123*  --  57*  CREATININE 4.04* 5.08* 3.91* 5.08*  --  2.86*  CALCIUM 8.7* 8.9 8.3* 8.4*  --  7.7*  PHOS 3.8 3.2 2.5 3.5  --  2.3*  ALBUMIN 2.5* 2.7* 2.4* 2.5* 2.5* 2.4*  Liver Function Tests: Recent Labs  Lab 03/27/23 0357 03/27/23 1538 04/23/2023 0335  AST  --  78*  --   ALT  --  5  --   ALKPHOS  --  147*  --   BILITOT  --  0.2*  --   PROT  --  5.0*  --   ALBUMIN 2.5* 2.5* 2.4*    No results for input(s): "LIPASE", "AMYLASE" in the last 168 hours. No results for input(s): "AMMONIA" in the last 168 hours. CBC: Recent Labs    03/23/23 0253 03/24/23 0404 03/26/23 0251 03/27/23 0357 03/27/23 1538 03/27/23 2030 04/17/2023 0659  HGB 7.3*   < > 7.0* 5.3* 6.7* 8.8* 8.2*  MCV 77.2*   < > 82.0 83.7 83.6  --  84.2  FERRITIN 6,385*  --   --   --   --   --   --   TIBC 168*  --   --   --   --   --   --   IRON 155  --   --   --   --   --   --   RETICCTPCT  --   --   --   --  3.8*  --   --    < > = values in this interval not displayed.     Cardiac Enzymes: No results for input(s): "CKTOTAL", "CKMB", "CKMBINDEX", "TROPONINI" in the last 168 hours. CBG: Recent Labs  Lab 04/05/2023 0424 04/12/2023 0714 03/25/2023 1102 04/06/2023 1135 03/30/2023 1153   GLUCAP 117* 144* 55* 124* 164*     Iron Studies:  No results for input(s): "IRON", "TIBC", "TRANSFERRIN", "FERRITIN" in the last 72 hours.  Studies/Results: No results found.  Medications: Infusions:  sodium chloride Stopped (03/13/23 1623)    ceFAZolin (ANCEF) IV 200 mL/hr at 03/27/23 2349   feeding supplement (NEPRO CARB STEADY) Stopped (03/27/23 2349)    Scheduled Medications:  sodium chloride   Intravenous Once   sodium chloride   Intravenous Once   atorvastatin  80 mg Per Tube Daily   Chlorhexidine Gluconate Cloth  6 each Topical Q0600   [START ON 03/29/2023] darbepoetin (ARANESP) injection - DIALYSIS  100 mcg Subcutaneous Q Sat-1800   famotidine  10 mg Per Tube Daily   heparin injection (subcutaneous)  5,000 Units Subcutaneous Q8H   insulin aspart  0-15 Units Subcutaneous Q4H   insulin glargine-yfgn  15 Units Subcutaneous QHS   loratadine  10 mg Per Tube Daily   multivitamin  1 tablet Per Tube QHS   mouth rinse  15 mL Mouth Rinse 4 times per day   polyethylene glycol  17 g Per Tube Daily   thiamine  100 mg Per Tube Daily    have reviewed scheduled and prn medications.  Physical Exam: General:NAD, comfortable, nasal oxygen  Heart:RRR, s1s2 nl Lungs:clear b/l, no crackle Abdomen:soft, Non-tender, non-distended Extremities:No edema Dialysis Access: Right upper extremity AV fistula has good thrill and bruit  Najwa Spillane Susann Givens PA-C [facility] 04/19/2023,1:36 PM

## 2023-03-28 NOTE — Progress Notes (Signed)
Tube feeding restarted per physician order, pt sitting up in chair resting. Call bell within reach and chair alarm set.

## 2023-03-28 NOTE — Transfer of Care (Signed)
Immediate Anesthesia Transfer of Care Note  Patient: Susan Evans  Procedure(s) Performed: PERCUTANEOUS ENDOSCOPIC GASTROSTOMY (PEG) PLACEMENT (Abdomen)  Patient Location: PACU  Anesthesia Type:General  Level of Consciousness: drowsy  Airway & Oxygen Therapy: Patient Spontanous Breathing and Patient connected to face mask oxygen  Post-op Assessment: Report given to RN and Post -op Vital signs reviewed and stable  Post vital signs: Reviewed and stable  Last Vitals:  Vitals Value Taken Time  BP 184/78 03/24/2023 1055  Temp    Pulse 94 03/27/2023 1059  Resp 19 03/27/2023 1059  SpO2 100 % 04/21/2023 1059  Vitals shown include unvalidated device data.  Last Pain:  Vitals:   04/12/2023 0847  TempSrc: Oral  PainSc: 0-No pain      Patients Stated Pain Goal: 0 (03/16/23 2245)  Complications: No notable events documented.

## 2023-03-28 NOTE — Progress Notes (Signed)
Susan Evans notified via telephone, per pt request, that she is going down now for peg tube placement.

## 2023-03-28 NOTE — Plan of Care (Signed)

## 2023-03-28 NOTE — Op Note (Signed)
   Procedure Note  Date: 03/27/2023  Procedure: esophagogastroduodenoscopy (EGD) and percutaneous endoscopic gastrostomy (PEG) tube placement  Pre-op diagnosis: dysphagia Post-op diagnosis: same  Indication and clinical history: 75F with dysphagia  Surgeon: Diamantina Monks, MD Assistant: Emmaline Kluver, PA  Anesthesia: general  Findings:  Specimen: none EBL: <5cc Drains/Implants: PEG tube,  1.5 cm at the skin   Disposition: ICU/PACU  Description of Procedure: The patient was positioned supine. Time-out was performed verifying correct patient, procedure, signature of informed consent, and pre-operative antibiotics as indicated. MAC induction was uneventful and a bite block was placed into the oropharynx. The endoscope was inserted into the oropharynx and advanced down the esophagus into the stomach and into the duodenum. The visualized esophagus and duodenum were unremarkable. The endoscope was retracted back into the stomach and the stomach was insufflated. The stomach was inspected and was also normal. Transillumination was performed. The light was visible on the external skin and dimpling of the stomach was noted endoscopically with manual pressure. The abdomen was prepped and draped in the usual sterile fashion. Transillumination and dimpling were repeated and local anesthetic was infiltrated to make a skin wheal at the site of transillumination. The needle was inserted perpendicularly to the skin and the tip of the needle was visualized endoscopically. As the needle was retracted, the tract was also anesthetized. A skin nick was made at the site of the wheal and an introducer needle and sheath were inserted. The needle was removed and guidewire inserted. The guidewire was grasped by an endoscopic snare and the snare, guidewire, and endoscope retracted out of the oropharynx. The PEG tube was secured to the guidewire and retracted through the mouth and esophagus into the stomach. The PEG tube was  secured with a bolster and was visualized endoscopically to spin freely circumferentially and also be without gaps between the internal bumper and the stomach wall. There was no evidence of bleeding. The PEG bolster was secured at  1.5 cm at the skin and there were no gaps between the bolster and the abdominal wall. The stomach was desufflated endoscopically and the endoscope removed. The bite block was also removed. The patient tolerated the procedure well and there were no complications.   The patient may have water and medications administered via the PEG tube beginning immediately and tube feeds may be initiated four hours post-procedure. Okay to restart prophylactic or therapeutic anticoagulation at 1630 on 04/19/2023.   Diamantina Monks, MD General and Trauma Surgery [facility] Surgery

## 2023-03-28 NOTE — Progress Notes (Signed)
SLP Cancellation Note  Patient Details Name: Susan Evans MRN: [id number] DOB: [birth year]   Cancelled treatment:       Reason Eval/Treat Not Completed: Patient not medically ready. Pt planned for PEG tube today.    Feleshia Zundel, Riley Nearing 03/24/2023, 8:33 AM

## 2023-03-29 ENCOUNTER — Encounter (HOSPITAL_COMMUNITY): Payer: Self-pay | Admitting: Surgery

## 2023-03-29 DIAGNOSIS — N186 End stage renal disease: Secondary | ICD-10-CM | POA: Diagnosis not present

## 2023-03-29 DIAGNOSIS — R1310 Dysphagia, unspecified: Secondary | ICD-10-CM | POA: Diagnosis not present

## 2023-03-29 DIAGNOSIS — E1122 Type 2 diabetes mellitus with diabetic chronic kidney disease: Secondary | ICD-10-CM | POA: Diagnosis not present

## 2023-03-29 DIAGNOSIS — I120 Hypertensive chronic kidney disease with stage 5 chronic kidney disease or end stage renal disease: Secondary | ICD-10-CM | POA: Diagnosis not present

## 2023-03-29 DIAGNOSIS — Z931 Gastrostomy status: Secondary | ICD-10-CM

## 2023-03-29 LAB — BASIC METABOLIC PANEL
Anion gap: 11 (ref 5–15)
BUN: 91 mg/dL — ABNORMAL HIGH (ref 8–23)
CO2: 28 mmol/L (ref 22–32)
Calcium: 8 mg/dL — ABNORMAL LOW (ref 8.9–10.3)
Chloride: 90 mmol/L — ABNORMAL LOW (ref 98–111)
Creatinine, Ser: 4.05 mg/dL — ABNORMAL HIGH (ref 0.44–1.00)
GFR, Estimated: 12 mL/min — ABNORMAL LOW (ref >60)
Glucose, Bld: 237 mg/dL — ABNORMAL HIGH (ref 70–99)
Potassium: 4.5 mmol/L (ref 3.5–5.1)
Sodium: 129 mmol/L — ABNORMAL LOW (ref 135–145)

## 2023-03-29 LAB — CBC
HCT: 20.8 % — ABNORMAL LOW (ref 36.0–46.0)
Hemoglobin: 7.2 g/dL — ABNORMAL LOW (ref 12.0–15.0)
MCH: 28.9 pg (ref 26.0–34.0)
MCHC: 34.6 g/dL (ref 30.0–36.0)
MCV: 83.5 fL (ref 80.0–100.0)
Platelets: 218 10*3/uL (ref 150–400)
RBC: 2.49 MIL/uL — ABNORMAL LOW (ref 3.87–5.11)
RDW: 16.3 % — ABNORMAL HIGH (ref 11.5–15.5)
WBC: 18.7 10*3/uL — ABNORMAL HIGH (ref 4.0–10.5)
nRBC: 0.4 % — ABNORMAL HIGH (ref 0.0–0.2)

## 2023-03-29 LAB — GLUCOSE, CAPILLARY
Glucose-Capillary: 199 mg/dL — ABNORMAL HIGH (ref 70–99)
Glucose-Capillary: 205 mg/dL — ABNORMAL HIGH (ref 70–99)
Glucose-Capillary: 220 mg/dL — ABNORMAL HIGH (ref 70–99)
Glucose-Capillary: 237 mg/dL — ABNORMAL HIGH (ref 70–99)
Glucose-Capillary: 296 mg/dL — ABNORMAL HIGH (ref 70–99)

## 2023-03-29 MED ORDER — INSULIN GLARGINE-YFGN 100 UNIT/ML ~~LOC~~ SOLN
25.0000 [IU] | Freq: Every day | SUBCUTANEOUS | Status: DC
  Administered 2023-03-29 – 2023-03-30 (×2): 25 [IU] via SUBCUTANEOUS
  Filled 2023-03-29 (×3): qty 0.25

## 2023-03-29 NOTE — Progress Notes (Signed)
Received patient in bed to unit.  Alert and oriented.  Informed consent signed and in chart.   TX duration:3:03 pt request to end tx early, early treatment termination paper signed  Patient tolerated well.  Transported back to the room  Alert, without acute distress.  Hand-off given to patient's nurse.   Access used: right AVF Access issues: high vp during tx bfr 200 whole tx alarmed multiple times  Total UF removed: Medication(s) given: none   03/29/23 1414  Vitals  Temp 98.3 F (36.8 C)  Temp Source Oral  BP 106/85  BP Location Left Wrist  BP Method Automatic  Patient Position (if appropriate) Lying  Pulse Rate 95  Pulse Rate Source Monitor  Resp 17  Oxygen Therapy  SpO2 98 %  O2 Device Room Air  Patient Activity (if Appropriate) In bed  During Treatment Monitoring  HD Safety Checks Performed Yes  Intra-Hemodialysis Comments Tx completed (tx terminated early per pt request AMA paper signed)  Dialysis Fluid Bolus Normal Saline  Bolus Amount (mL) 300 mL  Post Treatment  Dialyzer Clearance Lightly streaked  Duration of HD Treatment -hour(s)  (3:03)  Liters Processed 42.7  Fluid Removed (mL) 600 mL  Tolerated HD Treatment Yes  AVG/AVF Arterial Site Held (minutes) 15 minutes  AVG/AVF Venous Site Held (minutes) 15 minutes      Absalom Aro S [facility]

## 2023-03-29 NOTE — Progress Notes (Signed)
Trauma/Critical Care Follow Up Note  Subjective:    Overnight Issues: NAEON  Objective:  Vital signs for last 24 hours: Temp:  [97.4 F (36.3 C)-98.5 F (36.9 C)] 98.1 F (36.7 C) (07/06 0909) Pulse Rate:  [67-102] 95 (07/06 1104) Resp:  [13-20] 18 (07/06 1104) BP: (93-150)/(56-93) 115/93 (07/06 1104) SpO2:  [89 %-100 %] 100 % (07/06 1104) Weight:  [39.1 kg] 39.1 kg (07/06 0907)  Hemodynamic parameters for last 24 hours:    Intake/Output from previous day: 07/05 0701 - 07/06 0700 In: 300 [I.V.:300] Out: -   Intake/Output this shift: No intake/output data recorded.  Vent settings for last 24 hours:    Physical Exam:  Gen: comfortable, no distress Neuro: follows commands, alert, communicative HEENT: PERRL Neck: supple CV: RRR Pulm: unlabored breathing on [place] Abd: soft, NT PEG intact and 1.5 at the skin  GU: urine clear and yellow  Extr: wwp, no edema  Results for orders placed or performed during the hospital encounter of 03/21/2023 (from the past 24 hour(s))  Glucose, capillary     Status: Abnormal   Collection Time: 03/26/2023 11:35 AM  Result Value Ref Range   Glucose-Capillary 124 (H) 70 - 99 mg/dL  Glucose, capillary     Status: Abnormal   Collection Time: 03/29/2023 11:53 AM  Result Value Ref Range   Glucose-Capillary 164 (H) 70 - 99 mg/dL  Renal function panel     Status: Abnormal   Collection Time: 04/11/2023  2:53 PM  Result Value Ref Range   Sodium 129 (L) 135 - 145 mmol/L   Potassium 4.6 3.5 - 5.1 mmol/L   Chloride 92 (L) 98 - 111 mmol/L   CO2 26 22 - 32 mmol/L   Glucose, Bld 188 (H) 70 - 99 mg/dL   BUN 71 (H) 8 - 23 mg/dL   Creatinine, Ser 4.09 (H) 0.44 - 1.00 mg/dL   Calcium 7.8 (L) 8.9 - 10.3 mg/dL   Phosphorus 3.3 2.5 - 4.6 mg/dL   Albumin 2.5 (L) 3.5 - 5.0 g/dL   GFR, Estimated 14 (L) >60 mL/min   Anion gap 11 5 - 15  Glucose, capillary     Status: Abnormal   Collection Time: 04/03/2023  3:23 PM  Result Value Ref Range   Glucose-Capillary  197 (H) 70 - 99 mg/dL  Glucose, capillary     Status: Abnormal   Collection Time: 03/24/2023  7:37 PM  Result Value Ref Range   Glucose-Capillary 266 (H) 70 - 99 mg/dL  Glucose, capillary     Status: Abnormal   Collection Time: 04/14/2023 11:12 PM  Result Value Ref Range   Glucose-Capillary 246 (H) 70 - 99 mg/dL  CBC     Status: Abnormal   Collection Time: 03/29/23  2:35 AM  Result Value Ref Range   WBC 18.7 (H) 4.0 - 10.5 K/uL   RBC 2.49 (L) 3.87 - 5.11 MIL/uL   Hemoglobin 7.2 (L) 12.0 - 15.0 g/dL   HCT 81.1 (L) 91.4 - 78.2 %   MCV 83.5 80.0 - 100.0 fL   MCH 28.9 26.0 - 34.0 pg   MCHC 34.6 30.0 - 36.0 g/dL   RDW 95.6 (H) 21.3 - 08.6 %   Platelets 218 150 - 400 K/uL   nRBC 0.4 (H) 0.0 - 0.2 %  Basic metabolic panel     Status: Abnormal   Collection Time: 03/29/23  2:35 AM  Result Value Ref Range   Sodium 129 (L) 135 - 145 mmol/L  Potassium 4.5 3.5 - 5.1 mmol/L   Chloride 90 (L) 98 - 111 mmol/L   CO2 28 22 - 32 mmol/L   Glucose, Bld 237 (H) 70 - 99 mg/dL   BUN 91 (H) 8 - 23 mg/dL   Creatinine, Ser 1.61 (H) 0.44 - 1.00 mg/dL   Calcium 8.0 (L) 8.9 - 10.3 mg/dL   GFR, Estimated 12 (L) >60 mL/min   Anion gap 11 5 - 15  Glucose, capillary     Status: Abnormal   Collection Time: 03/29/23  3:07 AM  Result Value Ref Range   Glucose-Capillary 220 (H) 70 - 99 mg/dL  Glucose, capillary     Status: Abnormal   Collection Time: 03/29/23  7:57 AM  Result Value Ref Range   Glucose-Capillary 237 (H) 70 - 99 mg/dL    Assessment & Plan:  Present on Admission:  Bacteremia due to group B Streptococcus  ESRD (end stage renal disease) (HCC)  Essential hypertension  Malignant neoplasm of pharynx, unspecified (HCC)  Type 2 diabetes mellitus with complication (HCC)  Protein-calorie malnutrition, severe  Acute hypoxic respiratory failure (HCC)  Microcytic anemia  (Resolved) Angio-edema    LOS: 18 days   Dysphagia, history of head and neck cancer s/p surgical treatment and chemoradiation  in 2011  S/P PEG - PEG in good position  - continue binder loosely to protect - trauma will sign off at this time  Angioedema - extubated, no airway concerns currently ESRD on HD TTS HTN DM2 GERD Carotid stenosis    FEN - TF via PEG VTE - [facility] ID - Ancef for Streptococcus agalactiae bacteremia Dispo - 4NP   Juliet Rude, [facility] Surgery 03/29/2023, 11:15 AM Please see Amion for pager number during day hours 7:00am-4:30pm

## 2023-03-29 NOTE — Progress Notes (Signed)
[facility] NEPHROLOGY PROGRESS NOTE  Assessment/ Plan: [name]   OP HD: TTS [facility]     4h  350/500   36kg  3K/2.5Ca bath  RUA AVF   -Heparin  2000 units initial bolus + 2000 units IV midrun-Decrease dose on discharge-too high for weight.   - last OP HD 6/18, post wt 38.4  - hectorol 2 mcg IV three times per week  - coming off at dry wt most of last 2 wks  # Angioedema: occurred 12/2021 as well.  Both times this has happened have been in the setting of infection but that's the only common link, not sure that it was the same organism anyway.  CT maxillofacial showed soft tissue swelling/ cellulitis w/o abscess.  No sign of AVF infection on exam.  Pt required nasal intubation by ENT. Pt rec'd a course of decadron and pepcid. Pt is already dialyzing on a hypoallergenic dialyzer at OP dialysis for the past year. Will keep this on indefinitely.    #Acute resp failure - was intubated nasally by ENT initially. F/u video laryngoscope done by CCM showed improved swelling of the cords. Pt was extubated successfully.   # Hyperkalemia: Managed with dialysis.  #ESRD - TTS HD.  HD Saturday   #S agalactiae bacteremia: as above in #1, on cefazolin.  Per pharm and ID she will need IV Ancef 2gm post HD TTS through 7/31. [facility] consulted to assess her AVF, they found no signs of infection. Appreciate assistance.   # HTN/ vol: Monitor BP, UF as tolerated.  # Anemia of ESRD: Hemoglobin dropped again- 7.2 today. Denies GI losses. Received 2 units prbcs on 7/4. Transfuse prn. Continue erythropoietin with HD.  Aranesp 100 q Saturday -due today.  # Metabolic Bone Disease: Phosphorus acceptable, off of binders.    # Dysphagia with history of head and neck cancer status postradiation therapy: Getting tube feeds. Had PEG tube placed 04/23/2023.   Subjective: Seen in dialysis unit. No new events overnight. Has some discomfort around PEG site.    Objective Vital signs in last 24  hours: Vitals:   03/29/23 0754 03/29/23 0907 03/29/23 0909 03/29/23 0916  BP: (!) 114/59  127/66 (!) 150/56  Pulse: 94   67  Resp:   17 15  Temp: 98.5 F (36.9 C)  98.1 F (36.7 C)   TempSrc: Oral  Oral   SpO2: 98%  (!) 89% (!) 89%  Weight:  39.1 kg    Height:       Weight change:   Intake/Output Summary (Last 24 hours) at 03/29/2023 0959 Last data filed at 04/20/2023 1036 Gross per 24 hour  Intake 300 ml  Output --  Net 300 ml        Labs: RENAL PANEL Recent Labs  Lab 03/25/23 0440 03/26/23 0251 03/27/23 0357 03/27/23 1538 04/11/2023 0335 04/22/2023 1453 03/29/23 0235  NA 134* 132* 132*  --  130* 129* 129*  K 4.4 4.1 4.3  --  3.6 4.6 4.5  CL 90* 91* 89*  --  92* 92* 90*  CO2 30 30 32  --  30 26 28   GLUCOSE 241* 184* 151*  --  123* 188* 237*  BUN 110* 84* 123*  --  57* 71* 91*  CREATININE 5.08* 3.91* 5.08*  --  2.86* 3.43* 4.05*  CALCIUM 8.9 8.3* 8.4*  --  7.7* 7.8* 8.0*  PHOS 3.2 2.5 3.5  --  2.3* 3.3  --  ALBUMIN 2.7* 2.4* 2.5* 2.5* 2.4* 2.5*  --      Liver Function Tests: Recent Labs  Lab 03/27/23 1538 04/08/2023 0335 03/24/2023 1453  AST 78*  --   --   ALT 5  --   --   ALKPHOS 147*  --   --   BILITOT 0.2*  --   --   PROT 5.0*  --   --   ALBUMIN 2.5* 2.4* 2.5*    No results for input(s): "LIPASE", "AMYLASE" in the last 168 hours. No results for input(s): "AMMONIA" in the last 168 hours. CBC: Recent Labs    03/23/23 0253 03/24/23 0404 03/27/23 0357 03/27/23 1538 03/27/23 2030 04/22/2023 0659 03/29/23 0235  HGB 7.3*   < > 5.3* 6.7* 8.8* 8.2* 7.2*  MCV 77.2*   < > 83.7 83.6  --  84.2 83.5  FERRITIN 6,385*  --   --   --   --   --   --   TIBC 168*  --   --   --   --   --   --   IRON 155  --   --   --   --   --   --   RETICCTPCT  --   --   --  3.8*  --   --   --    < > = values in this interval not displayed.     Cardiac Enzymes: No results for input(s): "CKTOTAL", "CKMB", "CKMBINDEX", "TROPONINI" in the last 168 hours. CBG: Recent Labs  Lab  03/24/2023 1523 04/10/2023 1937 04/21/2023 2312 03/29/23 0307 03/29/23 0757  GLUCAP 197* 266* 246* 220* 237*     Iron Studies:  No results for input(s): "IRON", "TIBC", "TRANSFERRIN", "FERRITIN" in the last 72 hours.  Studies/Results: No results found.  Medications: Infusions:  sodium chloride Stopped (03/13/23 1623)   anticoagulant sodium citrate      ceFAZolin (ANCEF) IV 200 mL/hr at 03/27/23 2349   feeding supplement (NEPRO CARB STEADY) 1,000 mL (03/29/2023 1744)    Scheduled Medications:  sodium chloride   Intravenous Once   sodium chloride   Intravenous Once   atorvastatin  80 mg Per Tube Daily   Chlorhexidine Gluconate Cloth  6 each Topical Q0600   darbepoetin (ARANESP) injection - DIALYSIS  100 mcg Subcutaneous Q Sat-1800   famotidine  10 mg Per Tube Daily   heparin injection (subcutaneous)  5,000 Units Subcutaneous Q8H   insulin aspart  0-15 Units Subcutaneous Q4H   insulin glargine-yfgn  15 Units Subcutaneous QHS   loratadine  10 mg Per Tube Daily   multivitamin  1 tablet Per Tube QHS   mouth rinse  15 mL Mouth Rinse 4 times per day   polyethylene glycol  17 g Per Tube Daily   thiamine  100 mg Per Tube Daily    have reviewed scheduled and prn medications.  Physical Exam: General:NAD, comfortable, nasal oxygen  Heart:RRR, s1s2 nl Lungs:clear b/l, no crackle Abdomen:soft, Non-tender, non-distended Extremities:No edema Dialysis Access: Right upper extremity AV fistula has good thrill and bruit  Saylor Sheckler Susann Givens PA-C  [facility] 03/29/2023,9:59 AM

## 2023-03-29 NOTE — Plan of Care (Signed)
  Problem: Education: Goal: Knowledge of General Education information will improve Description: Including pain rating scale, medication(s)/side effects and non-pharmacologic comfort measures 03/29/2023 2248 by Yvone Neu, RN Outcome: Progressing 03/29/2023 2248 by Yvone Neu, RN Outcome: Progressing   Problem: Health Behavior/Discharge Planning: Goal: Ability to manage health-related needs will improve 03/29/2023 2248 by Yvone Neu, RN Outcome: Progressing 03/29/2023 2248 by Yvone Neu, RN Outcome: Progressing   Problem: Clinical Measurements: Goal: Ability to maintain clinical measurements within normal limits will improve 03/29/2023 2248 by Yvone Neu, RN Outcome: Progressing 03/29/2023 2248 by Yvone Neu, RN Outcome: Progressing Goal: Will remain free from infection 03/29/2023 2248 by Yvone Neu, RN Outcome: Progressing 03/29/2023 2248 by Yvone Neu, RN Outcome: Progressing Goal: Diagnostic test results will improve 03/29/2023 2248 by Yvone Neu, RN Outcome: Progressing 03/29/2023 2248 by Yvone Neu, RN Outcome: Progressing Goal: Respiratory complications will improve 03/29/2023 2248 by Yvone Neu, RN Outcome: Progressing 03/29/2023 2248 by Yvone Neu, RN Outcome: Progressing Goal: Cardiovascular complication will be avoided 03/29/2023 2248 by Yvone Neu, RN Outcome: Progressing 03/29/2023 2248 by Yvone Neu, RN Outcome: Progressing   Problem: Activity: Goal: Risk for activity intolerance will decrease 03/29/2023 2248 by Yvone Neu, RN Outcome: Progressing 03/29/2023 2248 by Yvone Neu, RN Outcome: Progressing   Problem: Nutrition: Goal: Adequate nutrition will be maintained 03/29/2023 2248 by Yvone Neu, RN Outcome: Progressing 03/29/2023 2248 by Yvone Neu, RN Outcome: Progressing   Problem: Coping: Goal: Level of anxiety will decrease 03/29/2023 2248 by Yvone Neu, RN Outcome:  Progressing 03/29/2023 2248 by Yvone Neu, RN Outcome: Progressing   Problem: Elimination: Goal: Will not experience complications related to bowel motility 03/29/2023 2248 by Yvone Neu, RN Outcome: Progressing 03/29/2023 2248 by Yvone Neu, RN Outcome: Progressing Goal: Will not experience complications related to urinary retention 03/29/2023 2248 by Yvone Neu, RN Outcome: Progressing 03/29/2023 2248 by Yvone Neu, RN Outcome: Progressing   Problem: Pain Managment: Goal: General experience of comfort will improve 03/29/2023 2248 by Yvone Neu, RN Outcome: Progressing 03/29/2023 2248 by Yvone Neu, RN Outcome: Progressing   Problem: Safety: Goal: Ability to remain free from injury will improve Outcome: Progressing   Problem: Skin Integrity: Goal: Risk for impaired skin integrity will decrease Outcome: Progressing   Problem: Education: Goal: Ability to describe self-care measures that may prevent or decrease complications (Diabetes Survival Skills Education) will improve Outcome: Progressing Goal: Individualized Educational Video(s) Outcome: Progressing   Problem: Coping: Goal: Ability to adjust to condition or change in health will improve Outcome: Progressing   Problem: Fluid Volume: Goal: Ability to maintain a balanced intake and output will improve Outcome: Progressing   Problem: Health Behavior/Discharge Planning: Goal: Ability to identify and utilize available resources and services will improve Outcome: Progressing Goal: Ability to manage health-related needs will improve Outcome: Progressing   Problem: Metabolic: Goal: Ability to maintain appropriate glucose levels will improve Outcome: Progressing   Problem: Nutritional: Goal: Maintenance of adequate nutrition will improve Outcome: Progressing Goal: Progress toward achieving an optimal weight will improve Outcome: Progressing   Problem: Skin Integrity: Goal: Risk for  impaired skin integrity will decrease Outcome: Progressing   Problem: Tissue Perfusion: Goal: Adequacy of tissue perfusion will improve Outcome: Progressing   Problem: Safety: Goal: Non-violent Restraint(s) Outcome: Progressing

## 2023-03-29 NOTE — Progress Notes (Signed)
   HD#18 SUBJECTIVE:  Patient Summary: Susan Evans is a 67 y.o. with a pertinent PMH of HTN, diabetes, ESRD on HD TThS, who presented with anigioedema and admitted for angioedema requiring intubation s/p extubation and group B strep bacteremia.   Overnight Events: No acute events overnight  Interim History: The patient was evaluated at the bedside this morning prior to dialysis. She feels fine and notes that the pain medications are helping her pain around the PEG site. Denies any melena or hematochezia.   OBJECTIVE:  Vital Signs: Vitals:   03/29/23 0300 03/29/23 0305 03/29/23 0400 03/29/23 0600  BP:  (!) 107/58 93/69   Pulse: 91 90 93 93  Resp:  20    Temp:  98.1 F (36.7 C)    TempSrc:  Oral    SpO2: 95% 91% 95% 99%  Weight:      Height:       Supplemental O2: Room Air SpO2: 99 % O2 Flow Rate (L/min): 3.5 L/min FiO2 (%): (S) 55 %  Filed Weights   03/26/23 0304 03/27/23 1543 03/29/2023 0420  Weight: 38.6 kg 38.6 kg 39.3 kg     Intake/Output Summary (Last 24 hours) at 03/29/2023 1308 Last data filed at 04/02/2023 1036 Gross per 24 hour  Intake 300 ml  Output --  Net 300 ml   Net IO Since Admission: 7,868.42 mL [03/29/23 0632]  Physical Exam: General: Pleasant, elderly female laying in bed. No acute distress. Pulmonary: Normal effort on supplemental O2 (3 L [place]).  Abdominal: Soft, nontender, nondistended. Normal bowel sounds. PEG in place. Skin: Warm and dry.  Psych: Normal mood and affect    ASSESSMENT/PLAN:  Assessment: Principal Problem:   Bacteremia due to group B Streptococcus Active Problems:   Type 2 diabetes mellitus with complication (HCC)   Essential hypertension   ESRD (end stage renal disease) (HCC)   Acute hypoxic respiratory failure (HCC)   Microcytic anemia   Malignant neoplasm of pharynx, unspecified (HCC)   Protein-calorie malnutrition, severe   Dysphagia, pharyngoesophageal phase   Plan: Dysphagia s/p PEG tube placement PEG placed  on 7/5 by general surgery, no complications noted. Tube feeds resumed.   Group B strep bacteremia WBC count continues to rise- was 12.8 two days ago and is 18.7 today, concerning for infection although no clear source of new infection is noted on exam today. The patient remains afebrile and vitals are otherwise stable, however, will evaluate this further. - Repeat blood cultures - Continue IV cefazolin with HD through 7/13  ESRD on HD TThS Chronic normocytic anemia Plan for HD today. Hb did drop to 7.2, although she has no signs of bleeding and her reticulocyte count is consistent with hypoproliferation. - Aranesp with HD today - Daily CBC   Best Practice: Diet: Tube feeds VTE: heparin injection 5,000 Units Start: 04/11/2023 2200 SCDs Start: 03/23/2023 1504 Code: Full AB: Cefazolin Therapy Recs: Home Health DISPO: Anticipated discharge in 1-2 days to Home pending  medical stability/safe dispo plan .  Signature: Elza Rafter, D.O.  Internal Medicine Resident, PGY-3 [facility]  Pager: [phone] 6:32 AM, 03/29/2023   Please contact the on call pager after 5 pm and on weekends at [phone].

## 2023-03-30 DIAGNOSIS — D649 Anemia, unspecified: Secondary | ICD-10-CM

## 2023-03-30 DIAGNOSIS — R7881 Bacteremia: Secondary | ICD-10-CM | POA: Diagnosis not present

## 2023-03-30 DIAGNOSIS — I120 Hypertensive chronic kidney disease with stage 5 chronic kidney disease or end stage renal disease: Secondary | ICD-10-CM | POA: Diagnosis not present

## 2023-03-30 DIAGNOSIS — K861 Other chronic pancreatitis: Secondary | ICD-10-CM

## 2023-03-30 DIAGNOSIS — B951 Streptococcus, group B, as the cause of diseases classified elsewhere: Secondary | ICD-10-CM | POA: Diagnosis not present

## 2023-03-30 DIAGNOSIS — R1310 Dysphagia, unspecified: Secondary | ICD-10-CM | POA: Diagnosis not present

## 2023-03-30 LAB — CBC WITH DIFFERENTIAL/PLATELET
Abs Immature Granulocytes: 0.14 10*3/uL — ABNORMAL HIGH (ref 0.00–0.07)
Basophils Absolute: 0.1 10*3/uL (ref 0.0–0.1)
Basophils Relative: 0 %
Eosinophils Absolute: 0.1 10*3/uL (ref 0.0–0.5)
Eosinophils Relative: 1 %
HCT: 24.7 % — ABNORMAL LOW (ref 36.0–46.0)
Hemoglobin: 8.3 g/dL — ABNORMAL LOW (ref 12.0–15.0)
Immature Granulocytes: 1 %
Lymphocytes Relative: 4 %
Lymphs Abs: 0.7 10*3/uL (ref 0.7–4.0)
MCH: 31 pg (ref 26.0–34.0)
MCHC: 33.6 g/dL (ref 30.0–36.0)
MCV: 92.2 fL (ref 80.0–100.0)
Monocytes Absolute: 1.6 10*3/uL — ABNORMAL HIGH (ref 0.1–1.0)
Monocytes Relative: 9 %
Neutro Abs: 15.7 10*3/uL — ABNORMAL HIGH (ref 1.7–7.7)
Neutrophils Relative %: 85 %
Platelets: 224 10*3/uL (ref 150–400)
RBC: 2.68 MIL/uL — ABNORMAL LOW (ref 3.87–5.11)
RDW: 17.1 % — ABNORMAL HIGH (ref 11.5–15.5)
WBC: 18.4 10*3/uL — ABNORMAL HIGH (ref 4.0–10.5)
nRBC: 0.5 % — ABNORMAL HIGH (ref 0.0–0.2)

## 2023-03-30 LAB — CBC
HCT: 21.6 % — ABNORMAL LOW (ref 36.0–46.0)
Hemoglobin: 6.8 g/dL — CL (ref 12.0–15.0)
MCH: 27.8 pg (ref 26.0–34.0)
MCHC: 31.5 g/dL (ref 30.0–36.0)
MCV: 88.2 fL (ref 80.0–100.0)
Platelets: 205 10*3/uL (ref 150–400)
RBC: 2.45 MIL/uL — ABNORMAL LOW (ref 3.87–5.11)
RDW: 17.6 % — ABNORMAL HIGH (ref 11.5–15.5)
WBC: 21.1 10*3/uL — ABNORMAL HIGH (ref 4.0–10.5)
nRBC: 0.6 % — ABNORMAL HIGH (ref 0.0–0.2)

## 2023-03-30 LAB — RENAL FUNCTION PANEL
Albumin: 2.7 g/dL — ABNORMAL LOW (ref 3.5–5.0)
Anion gap: 14 (ref 5–15)
BUN: 62 mg/dL — ABNORMAL HIGH (ref 8–23)
CO2: 27 mmol/L (ref 22–32)
Calcium: 8.3 mg/dL — ABNORMAL LOW (ref 8.9–10.3)
Chloride: 93 mmol/L — ABNORMAL LOW (ref 98–111)
Creatinine, Ser: 2.91 mg/dL — ABNORMAL HIGH (ref 0.44–1.00)
GFR, Estimated: 17 mL/min — ABNORMAL LOW (ref >60)
Glucose, Bld: 110 mg/dL — ABNORMAL HIGH (ref 70–99)
Phosphorus: 3.1 mg/dL (ref 2.5–4.6)
Potassium: 3.5 mmol/L (ref 3.5–5.1)
Sodium: 134 mmol/L — ABNORMAL LOW (ref 135–145)

## 2023-03-30 LAB — GLUCOSE, CAPILLARY
Glucose-Capillary: 109 mg/dL — ABNORMAL HIGH (ref 70–99)
Glucose-Capillary: 113 mg/dL — ABNORMAL HIGH (ref 70–99)
Glucose-Capillary: 160 mg/dL — ABNORMAL HIGH (ref 70–99)
Glucose-Capillary: 167 mg/dL — ABNORMAL HIGH (ref 70–99)
Glucose-Capillary: 190 mg/dL — ABNORMAL HIGH (ref 70–99)
Glucose-Capillary: 207 mg/dL — ABNORMAL HIGH (ref 70–99)

## 2023-03-30 LAB — CULTURE, BLOOD (ROUTINE X 2): Culture: NO GROWTH

## 2023-03-30 LAB — HAPTOGLOBIN: Haptoglobin: 126 mg/dL (ref 37–355)

## 2023-03-30 LAB — TYPE AND SCREEN

## 2023-03-30 LAB — PREPARE RBC (CROSSMATCH)

## 2023-03-30 MED ORDER — SODIUM CHLORIDE 0.9 % IV SOLN: INTRAVENOUS | Status: DC

## 2023-03-30 MED ORDER — SODIUM CHLORIDE 0.9% IV SOLUTION: Freq: Once | INTRAVENOUS | Status: AC

## 2023-03-30 MED ORDER — PANTOPRAZOLE SODIUM 40 MG IV SOLR
40.0000 mg | Freq: Two times a day (BID) | INTRAVENOUS | Status: DC
  Administered 2023-03-30: 40 mg via INTRAVENOUS
  Filled 2023-03-30: qty 10

## 2023-03-30 MED ORDER — PANTOPRAZOLE INFUSION (NEW) - SIMPLE MED
8.0000 mg/h | INTRAVENOUS | Status: DC
  Administered 2023-03-30 – 2023-03-31 (×2): 8 mg/h via INTRAVENOUS
  Filled 2023-03-30 (×5): qty 100

## 2023-03-30 NOTE — Progress Notes (Signed)
[facility] NEPHROLOGY PROGRESS NOTE  Assessment/ Plan: [name]   OP HD: TTS [facility]     4h  350/500   36kg  3K/2.5Ca bath  RUA AVF   -Heparin  2000 units initial bolus + 2000 units IV midrun-Decrease dose on discharge-too high for weight. Just 2000 initial bolus.  - last OP HD 6/18, post wt 38.4  - hectorol 2 mcg IV three times per week  - coming off at dry wt most of last 2 wks  # Angioedema: occurred 12/2021 as well.  Both times this has happened have been in the setting of infection but that's the only common link, not sure that it was the same organism anyway.  CT maxillofacial showed soft tissue swelling/ cellulitis w/o abscess.  No sign of AVF infection on exam.  Pt required nasal intubation by ENT. Pt rec'd a course of decadron and pepcid. Pt is already dialyzing on a hypoallergenic dialyzer at OP dialysis for the past year. Would  keep this on indefinitely.    #Acute resp failure - was intubated nasally by ENT initially. F/u video laryngoscope done by CCM showed improved swelling of the cords. Pt was extubated successfully.   # Hyperkalemia: Managed with dialysis.  #ESRD - TTS HD.  Tolerated Tx 7/6 with  only net UF and she appears euvolemic. Next HD on Tues.  #S agalactiae bacteremia: as above in #1, on cefazolin.  Per pharm and ID she will need IV Ancef 2gm post HD TTS through 7/31. VVS consulted to assess her AVF, they found no signs of infection. Appreciate assistance.   # HTN/ vol: Monitor BP, UF as tolerated.  # Anemia of ESRD: Hemoglobin dropped again- 6.8. Denies GI losses. Received 2 units prbcs on 7/4. Transfuse prn. Continue erythropoietin with HD.  Aranesp 100 q Saturday - given on 7/6. Bleeding from somewhere; seems minimal from the PEG.  # Metabolic Bone Disease: Phosphorus acceptable, off of binders.    # Dysphagia with history of head and neck cancer status postradiation therapy: Getting tube feeds. Had PEG tube placed 04/13/2023.    Subjective: Tolerated dialysis on 7/6 but only net UF. No new events overnight. Has some discomfort around PEG site and minor bleeding. Bandage slight blood tinged.    Objective Vital signs in last 24 hours: Vitals:   03/30/23 0620 03/30/23 0625 03/30/23 0630 03/30/23 0741  BP: (!) 119/48 131/85 129/67 (!) 169/51  Pulse: 99 100 (!) 104 99  Resp: 17 18 18 18   Temp: 98.3 F (36.8 C) 98.3 F (36.8 C) 98.3 F (36.8 C) 97.9 F (36.6 C)  TempSrc: Oral Oral Oral Oral  SpO2: 96% 95% 95% 96%  Weight:      Height:       Weight change:   Intake/Output Summary (Last 24 hours) at 03/30/2023 0849 Last data filed at 03/30/2023 0616 Gross per 24 hour  Intake 2062.24 ml  Output 601 ml  Net 1461.24 ml       Labs: RENAL PANEL Recent Labs  Lab 03/26/23 0251 03/27/23 0357 03/27/23 1538 04/07/2023 0335 04/16/2023 1453 03/29/23 0235 03/30/23 0415  NA 132* 132*  --  130* 129* 129* 134*  K 4.1 4.3  --  3.6 4.6 4.5 3.5  CL 91* 89*  --  92* 92* 90* 93*  CO2 30 32  --  30 26 28 27   GLUCOSE 184* 151*  --  123* 188* 237* 110*  BUN 84* 123*  --  57* 71* 91* 62*  CREATININE 3.91* 5.08*  --  2.86* 3.43* 4.05* 2.91*  CALCIUM 8.3* 8.4*  --  7.7* 7.8* 8.0* 8.3*  PHOS 2.5 3.5  --  2.3* 3.3  --  3.1  ALBUMIN 2.4* 2.5* 2.5* 2.4* 2.5*  --  2.7*    Liver Function Tests: Recent Labs  Lab 03/27/23 1538 04/08/2023 0335 04/02/2023 1453 03/30/23 0415  AST 78*  --   --   --   ALT 5  --   --   --   ALKPHOS 147*  --   --   --   BILITOT 0.2*  --   --   --   PROT 5.0*  --   --   --   ALBUMIN 2.5* 2.4* 2.5* 2.7*   No results for input(s): "LIPASE", "AMYLASE" in the last 168 hours. No results for input(s): "AMMONIA" in the last 168 hours. CBC: Recent Labs    03/23/23 0253 03/24/23 0404 03/27/23 1538 03/27/23 2030 04/16/2023 0659 03/29/23 0235 03/30/23 0415  HGB 7.3*   < > 6.7* 8.8* 8.2* 7.2* 6.8*  MCV 77.2*   < > 83.6  --  84.2 83.5 88.2  FERRITIN 6,385*  --   --   --   --   --   --    TIBC 168*  --   --   --   --   --   --   IRON 155  --   --   --   --   --   --   RETICCTPCT  --   --  3.8*  --   --   --   --    < > = values in this interval not displayed.    Cardiac Enzymes: No results for input(s): "CKTOTAL", "CKMB", "CKMBINDEX", "TROPONINI" in the last 168 hours. CBG: Recent Labs  Lab 03/29/23 1512 03/29/23 1942 03/29/23 2332 03/30/23 0355 03/30/23 0738  GLUCAP 205* 199* 296* 113* 167*    Iron Studies:  No results for input(s): "IRON", "TIBC", "TRANSFERRIN", "FERRITIN" in the last 72 hours.  Studies/Results: No results found.  Medications: Infusions:  sodium chloride Stopped (03/13/23 1623)    ceFAZolin (ANCEF) IV Stopped (03/29/23 1813)   feeding supplement (NEPRO CARB STEADY) 45 mL/hr at 03/30/23 0616    Scheduled Medications:  sodium chloride   Intravenous Once   sodium chloride   Intravenous Once   atorvastatin  80 mg Per Tube Daily   Chlorhexidine Gluconate Cloth  6 each Topical Q0600   darbepoetin (ARANESP) injection - DIALYSIS  100 mcg Subcutaneous Q Sat-1800   famotidine  10 mg Per Tube Daily   heparin injection (subcutaneous)  5,000 Units Subcutaneous Q8H   insulin aspart  0-15 Units Subcutaneous Q4H   insulin glargine-yfgn  25 Units Subcutaneous QHS   loratadine  10 mg Per Tube Daily   multivitamin  1 tablet Per Tube QHS   mouth rinse  15 mL Mouth Rinse 4 times per day   polyethylene glycol  17 g Per Tube Daily   thiamine  100 mg Per Tube Daily    have reviewed scheduled and prn medications.  Physical Exam: General:NAD, comfortable, nasal oxygen  Heart:RRR, s1s2 nl Lungs:clear b/l, no crackle Abdomen:soft, Non-tender, non-distended Extremities:No edema Dialysis Access: Right upper extremity AV fistula has good thrill and bruit

## 2023-03-30 NOTE — Consult Note (Signed)
Consultation Note   Referring Provider:  Teaching Service PCP: Kathleen Lime, MD Primary Gastroenterologist: Stan Head, MD        Reason for consultation:  Acute on chronic anemia  DOA: 02/27/2023         Hospital Day: 20   Assessment and Plan  Patient is a 67 y.o. year old female with a past medical history of DM2, ESRD on HD TTS, HTN, head and neck cancer s/p radiation, s/p PEG ( this admission) for pharyngeal dysphagia, GERD, chronic pancreatitis.   Worsening of chronic microcytic anemia / dark brown aspirate  from gastrostomy tube. Rule out erosive disease. Rule out PUD, AVMs. No evidence for iron deficiency by labs. Baseline hgb hard to discern but around 10-11. This admission it has declined as low as 5.3.  She has required several units of blood throughout this admission.  - 7/1 got a unit of blood - 7/4 got 2 units of blood - 7/7  got 1 unit of blood -Of note, hgb had been declining days before gastrostomy tube placed on 7/5. -Will need an EGD. She is overdue for a screening colonoscopy which can be done as outpatient. The risks and benefits of EGD with possible biopsies were discussed with the patient who agrees to proceed.  - I will stop SQ Heparin prior to EGD once I have a date / time - BID IV Pantoprazole  Angioedema which initially required intubation.   History of head and neck cancer s/p radiation. She is s/p gastrostomy tube placement this admission for chronic dysphagia.   Chronic pancreatitis  See PMH for additional medical history    History of Present Illness  Patient has had a prolonged admission for angioedema requiring intubation. Subsequently found to have group B strep bacteremia. She has a history of head and neck cancer s/p radiation. She is s/p gastrostomy tube placement this admission for chronic dysphagia.  Ferritin 6385 Low TIBC and low iron sat  Tayten wasn't taking any NSAIDs prior to  admission. She has no abdominal pain. She says that she vomited a small amount of blood this am. She endorses occasional loose stool. BMs o/w normal. Denies blood in stool or black stool.  She confirms history of GERD, takes daily Omeprazole which controls her symptoms.   No know [facility] of colon cancer.      Labs and Imaging:  Recent Labs    03/29/23 0235 03/30/23 0415 03/30/23 1048  WBC 18.7* 21.1* 18.4*  HGB 7.2* 6.8* 8.3*  HCT 20.8* 21.6* 24.7*  PLT 218 205 224   Recent Labs    04/18/2023 1453 03/29/23 0235 03/30/23 0415  NA 129* 129* 134*  K 4.6 4.5 3.5  CL 92* 90* 93*  CO2 26 28 27   GLUCOSE 188* 237* 110*  BUN 71* 91* 62*  CREATININE 3.43* 4.05* 2.91*  CALCIUM 7.8* 8.0* 8.3*   Recent Labs    03/27/23 1538 03/30/2023 0335 03/30/23 0415  PROT 5.0*  --   --   ALBUMIN 2.5*   < > 2.7*  AST 78*  --   --   ALT 5  --   --   ALKPHOS 147*  --   --   BILITOT 0.2*  --   --  BILIDIR <0.1  --   --   IBILI NOT CALCULATED  --   --    < > = values in this interval not displayed.   No results for input(s): "HEPBSAG", "HCVAB", "HEPAIGM", "HEPBIGM" in the last 72 hours. No results for input(s): "LABPROT", "INR" in the last 72 hours.  Previous GI Evaluation:   2005 EGD for anemia Small hiatal herna  2007 colonoscopy for FOBT Normal exam cecum to rectum but limited by prep   Principal Problem:   Bacteremia due to group B Streptococcus Active Problems:   Type 2 diabetes mellitus with complication (HCC)   Essential hypertension   ESRD (end stage renal disease) (HCC)   Acute hypoxic respiratory failure (HCC)   Microcytic anemia   Malignant neoplasm of pharynx, unspecified (HCC)   Protein-calorie malnutrition, severe   Dysphagia, pharyngoesophageal phase    Past Medical History:  Diagnosis Date   AKI (acute kidney injury) (HCC) 08/12/2019   Anaphylactic shock, unspecified, initial encounter 05/09/2020   Angio-edema 03/20/2023   Chronic kidney disease    Dialysis  T/Th/Sat- [address]   CKD stage G1/A3, GFR > 90 and albumin creatinine ratio >300 mg/g 07/08/2016   Nephrotic range proteinuria   Diabetes mellitus    type 2    Difficult intubation    Dyspnea    "when I have too much fluid."   Ganglion cyst    right wrist   GERD (gastroesophageal reflux disease)    History of radiation therapy 02/27/10 -04/13/10   left base of tongue, bilat neck   Hyperlipidemia    Hypertension    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/21/2021   Iron deficiency anemia    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   Menopause    Thyroid disease    Hyperthyroidism   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/08/2020   Tobacco abuse    PT HAS QUIT    Past Surgical History:  Procedure Laterality Date   AV FISTULA PLACEMENT Left 06/02/2020   Procedure: INSERTION OF ARTERIOVENOUS (AV) GORE-TEX GRAFT ARM LEFT;  Surgeon: Cephus Shelling, MD;  Location: [facility] OR;  Service: Vascular;  Laterality: Left;   AV FISTULA PLACEMENT Right 12/27/2020   Procedure: RIGHT ARM CEPHALIC ARTERIOVENOUS FISTULA CREATION;  Surgeon: Maeola Harman, MD;  Location: [facility] OR;  Service: Vascular;  Laterality: Right;   AVGG REMOVAL Left 12/21/2021   Procedure: EXCISION OF LEFT ARM ARTERIOVENOUS GORETEX GRAFT (AVGG);  Surgeon: Maeola Harman, MD;  Location: [facility] OR;  Service: Vascular;  Laterality: Left;   IR FLUORO GUIDE CV LINE LEFT  05/15/2020   IR REMOVAL TUN CV CATH W/O FL  05/15/2020   IR REMOVAL TUN CV CATH W/O FL  06/15/2021   IR US GUIDE VASC ACCESS LEFT  05/15/2020   NECK SURGERY     PEG PLACEMENT N/A 04/13/2023   Procedure: PERCUTANEOUS ENDOSCOPIC GASTROSTOMY (PEG) PLACEMENT;  Surgeon: Diamantina Monks, MD;  Location: [facility] OR;  Service: General;  Laterality: N/A;   REDUCTION MAMMAPLASTY     TRACHEOSTOMY TUBE PLACEMENT N/A 03/18/2023   Procedure: AWAKE INTUBATION;  Surgeon: Christia Reading, MD;  Location: [facility] OR;  Service: ENT;   Laterality: N/A;    Family History  Problem Relation Age of Onset   Stroke Father    Cancer Father        unknown ca   Hypertension Mother    Diabetes Mother    Chronic Renal Failure Sister  Chronic Renal Failure Brother    Cancer Paternal Aunt        colon ca   Breast cancer Neg Hx     Prior to Admission medications   Medication Sig Start Date End Date Taking? Authorizing Provider  ceFAZolin (ANCEF) IVPB Inject 2 g into the vein Every Tuesday,Thursday,and Saturday with dialysis. Indication:  GBS bacteremia/vascular infxn First Dose: Yes Last Day of Therapy:  04/23/23 Labs - Once weekly:  CBC/D and BMP, ESR and CRP Method of administration: Per HD protocol Method of administration may be changed at the discretion of home infusion pharmacist based upon assessment of the patient and/or caregiver's ability to self-administer the medication ordered. 03/20/23 04/23/23 Yes Danelle Earthly, MD  Accu-Chek FastClix Lancets MISC 5 (FIVE) TIMES DAILY DIAGNOSIS CODE E11.9 09/10/19   Tyson Alias, MD  acetaminophen (TYLENOL) 325 MG tablet Take 2 tablets (650 mg total) by mouth every 4 (four) hours as needed for mild pain (temp > 101.5). 12/24/21   Lynnell Catalan, MD  atorvastatin (LIPITOR) 80 MG tablet TAKE 1 TABLET BY MOUTH EVERY DAY 12/31/22   Willette Cluster, MD  B-D UF III MINI PEN NEEDLES 31G X 5 MM MISC USE TO INJECT NOVOLOG 3 TIMES A DAY. DIAGNOSIS CODE E11.9, Z79.4 04/03/21   Atway, Rayann N, DO  BD VEO INSULIN SYRINGE U/F 31G X 15/64" 0.3 ML MISC USE TO INJECT LANTUS INSULIN TWO TIMES A DAY 07/11/22   Willette Cluster, MD  Blood Glucose Monitoring Suppl (ACCU-CHEK AVIVA PLUS) w/Device KIT Use to test blood glucose  3 times daily 12/15/16   Arnetha Courser, MD  Cholecalciferol (VITAMIN D3) 50 MCG (2000 UT) capsule TAKE 1 CAPSULE BY MOUTH EVERY DAY 04/26/22   Atway, Rayann N, DO  cloNIDine (CATAPRES - DOSED IN MG/24 HR) 0.2 mg/24hr patch Place 1 patch (0.2 mg total) onto the skin once a week.  03/13/22   Carmel Sacramento, MD  glucose blood (ACCU-CHEK GUIDE) test strip TEST 5 (FIVE) TIMES DAILY DIAGNOSIS CODE E11.9 11/23/20   Alphonzo Severance, MD  hydrALAZINE (APRESOLINE) 50 MG tablet TAKE 1 TABLET BY MOUTH TWICE A DAY 12/31/22   Willette Cluster, MD  lidocaine-prilocaine (EMLA) cream Apply 1 application. topically as needed (dialysis).    [provider]  omeprazole (PRILOSEC) 20 MG capsule TAKE 1 CAPSULE BY MOUTH EVERY DAY 03/03/23   Willette Cluster, MD  sevelamer carbonate (RENVELA) 800 MG tablet Take 800 mg by mouth 3 (three) times daily with meals.    [provider]  verapamil (CALAN-SR) 180 MG CR tablet TAKE 2 TABLETS (360 MG TOTAL) BY MOUTH AT BEDTIME. 12/03/22   Willette Cluster, MD    Current Facility-Administered Medications  Medication Dose Route Frequency Provider Last Rate Last Admin   0.9 %  sodium chloride infusion   Intravenous PRN Adam Phenix, PA-C   Stopped at 03/13/23 1623   acetaminophen (TYLENOL) tablet 650 mg  650 mg Per Tube Q6H PRN Adam Phenix, PA-C   650 mg at 03/30/23 0808   atorvastatin (LIPITOR) tablet 80 mg  80 mg Per Tube Daily Adam Phenix, PA-C   80 mg at 03/30/23 0809   ceFAZolin (ANCEF) IVPB 2g/100 mL premix  2 g Intravenous Q T,Th,Sat-1800 Adam Phenix, PA-C   Stopped at 03/29/23 1813   Chlorhexidine Gluconate Cloth 2 % PADS 6 each  6 each Topical Q0600 Adam Phenix, PA-C   6 each at 03/30/23 0400   Darbepoetin Alfa (ARANESP) injection 100 mcg  100  mcg Subcutaneous Q Sat-1800 Adam Phenix, [place]   100 mcg at 03/29/23 1744   famotidine (PEPCID) tablet 10 mg  10 mg Per Tube Daily Adam Phenix, PA-C   10 mg at 03/30/23 1610   feeding supplement (NEPRO CARB STEADY) liquid 1,000 mL  1,000 mL Per Tube Continuous Adam Phenix, PA-C 45 mL/hr at 03/30/23 9604 Infusion Verify at 03/30/23 0616   heparin injection 5,000 Units  5,000 Units Subcutaneous Q8H Atway, Rayann N, DO   5,000 Units at 03/30/23 5409    HYDROmorphone (DILAUDID) injection 0.5 mg  0.5 mg Intravenous Q8H PRN Tyson Alias, MD   0.5 mg at 03/30/23 0138   insulin aspart (novoLOG) injection 0-15 Units  0-15 Units Subcutaneous Q4H Adam Phenix, [place]   3 Units at 03/30/23 1244   insulin glargine-yfgn (SEMGLEE) injection 25 Units  25 Units Subcutaneous QHS Atway, Rayann N, DO   25 Units at 03/29/23 2214   loratadine (CLARITIN) tablet 10 mg  10 mg Per Tube Daily Adam Phenix, PA-C   10 mg at 03/30/23 8119   multivitamin (RENA-VIT) tablet 1 tablet  1 tablet Per Tube QHS Adam Phenix, PA-C   1 tablet at 03/29/23 2214   ondansetron (ZOFRAN) injection 4 mg  4 mg Intravenous Q6H PRN Adam Phenix, PA-C   4 mg at 03/30/23 1478   Oral care mouth rinse  15 mL Mouth Rinse PRN Adam Phenix, PA-C       Oral care mouth rinse  15 mL Mouth Rinse PRN Adam Phenix, PA-C       Oral care mouth rinse  15 mL Mouth Rinse 4 times per day Adam Phenix, PA-C   15 mL at 03/30/23 1250   polyethylene glycol (MIRALAX / GLYCOLAX) packet 17 g  17 g Per Tube Daily Adam Phenix, PA-C   17 g at 03/30/23 2956   polyvinyl alcohol (LIQUIFILM TEARS) 1.4 % ophthalmic solution 1 drop  1 drop Both Eyes Q4H PRN Adam Phenix, PA-C   1 drop at 03/19/23 2130   thiamine (VITAMIN B1) tablet 100 mg  100 mg Per Tube Daily Adam Phenix, PA-C   100 mg at 03/30/23 0809    Allergies as of 02/27/2023 - Review Complete 02/24/2023  Allergen Reaction Noted   Hydrochlorothiazide Itching and Other (See Comments) 01/07/2014   Penicillins Other (See Comments)     Social History   Socioeconomic History   Marital status: Single    Spouse name: Not on file   Number of children: Not on file   Years of education: 11   Highest education level: Not on file  Occupational History    Employer: UNEMPLOYED    Employer: UNEMPLOYED  Tobacco Use   Smoking status: Former    Packs/day: .3    Types: Cigarettes    Quit date:  04/03/2021    Years since quitting: 1.9    Passive exposure: Never   Smokeless tobacco: Never   Tobacco comments:    1 cig per day   Vaping Use   Vaping Use: Never used  Substance and Sexual Activity   Alcohol use: No    Alcohol/week: 0.0 standard drinks of alcohol   Drug use: No   Sexual activity: Not on file  Other Topics Concern   Not on file  Social History Narrative   Not on file   Social Determinants of Health   Financial Resource Strain: Low Risk  (03/13/2022)  Overall Financial Resource Strain (CARDIA)    Difficulty of Paying Living Expenses: Not hard at all  Food Insecurity: No Food Insecurity (03/13/2022)   Hunger Vital Sign    Worried About Running Out of Food in the Last Year: Never true    Ran Out of Food in the Last Year: Never true  Transportation Needs: No Transportation Needs (03/13/2022)   PRAPARE - Administrator, Civil Service (Medical): No    Lack of Transportation (Non-Medical): No  Physical Activity: Insufficiently Active (03/13/2022)   Exercise Vital Sign    Days of Exercise per Week: 4 days    Minutes of Exercise per Session: 30 min  Stress: No Stress Concern Present (03/13/2022)   [employer] of Occupational Health - Occupational Stress Questionnaire    Feeling of Stress : Not at all  Social Connections: Socially Integrated (03/13/2022)   Social Connection and Isolation Panel [NHANES]    Frequency of Communication with Friends and Family: More than three times a week    Frequency of Social Gatherings with Friends and Family: More than three times a week    Attends Religious Services: More than 4 times per year    Active Member of [employer] or Organizations: Yes    Attends Banker Meetings: 1 to 4 times per year    Marital Status: Living with partner  Intimate Partner Violence: Not At Risk (03/13/2022)   Humiliation, Afraid, Rape, and Kick questionnaire    Fear of Current or Ex-Partner: No    Emotionally Abused: No     Physically Abused: No    Sexually Abused: No     Code Status   Code Status: Full Code  Review of Systems: All systems reviewed and negative except where noted in HPI.  Physical Exam: Vital signs in last 24 hours: Temp:  [97.9 F (36.6 C)-98.3 F (36.8 C)] 98.3 F (36.8 C) (07/07 1111) Pulse Rate:  [92-105] 101 (07/07 0911) Resp:  [17-20] 18 (07/07 1111) BP: (95-169)/(48-95) 111/95 (07/07 1111) SpO2:  [92 %-98 %] 95 % (07/07 1111) Weight:  [38.7 kg-41.9 kg] 41.9 kg (07/07 0243) Last BM Date : 03/27/23  General:  frail female in NAD. Speech difficult to understand Psych:  Cooperative. Normal mood and affect Eyes: Pupils equal Ears:  Normal auditory acuity Nose: No deformity, discharge or lesions Neck:  Supple, no masses felt Lungs:  Clear to auscultation.  Heart:  Regular rate, irregular rhythm.  Abdomen:  Soft, nondistended, nontender, active bowel sounds, no masses felt. Gastrostomy tube in place.  Rectal :  Deferred Msk: Symmetrical without gross deformities.  Neurologic:  Alert, oriented, grossly normal neurologically Extremities : No edema   Intake/Output from previous day: 07/06 0701 - 07/07 0700 In: 2062.2 [I.V.:12.3; Blood:303; KG/MW:1027; IV Piggyback:102.9] Out: 601 [Emesis/NG output:1] Intake/Output this shift:  Total I/O In: 432 [I.V.:50; Blood:382] Out: -      Willette Cluster, NP-C @  03/30/2023, 2:16 PM

## 2023-03-30 NOTE — H&P (View-Only) (Signed)
                                             Consultation Note   Referring Provider:  Teaching Service PCP: Amoako, Prince, MD Primary Gastroenterologist: Carl Gessner, MD        Reason for consultation:  Acute on chronic anemia  DOA: 03/01/2023         Hospital Day: 20   Assessment and Plan  Patient is a 67 y.o. year old female with a past medical history of DM2, ESRD on HD TTS, HTN, head and neck cancer s/p radiation, s/p PEG ( this admission) for pharyngeal dysphagia, GERD, chronic pancreatitis.   Worsening of chronic microcytic anemia / dark brown aspirate  from gastrostomy tube. Rule out erosive disease. Rule out PUD, AVMs. No evidence for iron deficiency by labs. Baseline hgb hard to discern but around 10-11. This admission it has declined as low as 5.3.  She has required several units of blood throughout this admission.  - 7/1 got a unit of blood - 7/4 got 2 units of blood - 7/7  got 1 unit of blood -Of note, hgb had been declining days before gastrostomy tube placed on 7/5. -Will need an EGD. She is overdue for a screening colonoscopy which can be done as outpatient. The risks and benefits of EGD with possible biopsies were discussed with the patient who agrees to proceed.  - I will stop SQ Heparin prior to EGD once I have a date / time - BID IV Pantoprazole  Angioedema which initially required intubation.   History of head and neck cancer s/p radiation. She is s/p gastrostomy tube placement this admission for chronic dysphagia.   Chronic pancreatitis  See PMH for additional medical history    History of Present Illness  Patient has had a prolonged admission for angioedema requiring intubation. Subsequently found to have group B strep bacteremia. She has a history of head and neck cancer s/p radiation. She is s/p gastrostomy tube placement this admission for chronic dysphagia.  Ferritin 6385 Low TIBC and low iron sat  Yazmeen wasn't taking any NSAIDs prior to  admission. She has no abdominal pain. She says that she vomited a small amount of blood this am. She endorses occasional loose stool. BMs o/w normal. Denies blood in stool or black stool.  She confirms history of GERD, takes daily Omeprazole which controls her symptoms.   No know [facility] of colon cancer.      Labs and Imaging:  Recent Labs    03/29/23 0235 03/30/23 0415 03/30/23 1048  WBC 18.7* 21.1* 18.4*  HGB 7.2* 6.8* 8.3*  HCT 20.8* 21.6* 24.7*  PLT 218 205 224   Recent Labs    04/03/2023 1453 03/29/23 0235 03/30/23 0415  NA 129* 129* 134*  K 4.6 4.5 3.5  CL 92* 90* 93*  CO2 26 28 27  GLUCOSE 188* 237* 110*  BUN 71* 91* 62*  CREATININE 3.43* 4.05* 2.91*  CALCIUM 7.8* 8.0* 8.3*   Recent Labs    03/27/23 1538 03/26/2023 0335 03/30/23 0415  PROT 5.0*  --   --   ALBUMIN 2.5*   < > 2.7*  AST 78*  --   --   ALT 5  --   --   ALKPHOS 147*  --   --   BILITOT 0.2*  --   --     BILIDIR <0.1  --   --   IBILI NOT CALCULATED  --   --    < > = values in this interval not displayed.   No results for input(s): "HEPBSAG", "HCVAB", "HEPAIGM", "HEPBIGM" in the last 72 hours. No results for input(s): "LABPROT", "INR" in the last 72 hours.  Previous GI Evaluation:   2005 EGD for anemia Small hiatal herna  2007 colonoscopy for FOBT Normal exam cecum to rectum but limited by prep   Principal Problem:   Bacteremia due to group B Streptococcus Active Problems:   Type 2 diabetes mellitus with complication (HCC)   Essential hypertension   ESRD (end stage renal disease) (HCC)   Acute hypoxic respiratory failure (HCC)   Microcytic anemia   Malignant neoplasm of pharynx, unspecified (HCC)   Protein-calorie malnutrition, severe   Dysphagia, pharyngoesophageal phase    Past Medical History:  Diagnosis Date   AKI (acute kidney injury) (HCC) 08/12/2019   Anaphylactic shock, unspecified, initial encounter 05/09/2020   Angio-edema 03/20/2023   Chronic kidney disease    Dialysis  T/Th/Sat- [address]   CKD stage G1/A3, GFR > 90 and albumin creatinine ratio >300 mg/g 07/08/2016   Nephrotic range proteinuria   Diabetes mellitus    type 2    Difficult intubation    Dyspnea    "when I have too much fluid."   Ganglion cyst    right wrist   GERD (gastroesophageal reflux disease)    History of radiation therapy 02/27/10 -04/13/10   left base of tongue, bilat neck   Hyperlipidemia    Hypertension    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/21/2021   Iron deficiency anemia    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   Menopause    Thyroid disease    Hyperthyroidism   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/08/2020   Tobacco abuse    PT HAS QUIT    Past Surgical History:  Procedure Laterality Date   AV FISTULA PLACEMENT Left 06/02/2020   Procedure: INSERTION OF ARTERIOVENOUS (AV) GORE-TEX GRAFT ARM LEFT;  Surgeon: Clark, Christopher J, MD;  Location: [facility] OR;  Service: Vascular;  Laterality: Left;   AV FISTULA PLACEMENT Right 12/27/2020   Procedure: RIGHT ARM CEPHALIC ARTERIOVENOUS FISTULA CREATION;  Surgeon: Cain, Brandon Christopher, MD;  Location: [facility] OR;  Service: Vascular;  Laterality: Right;   AVGG REMOVAL Left 12/21/2021   Procedure: EXCISION OF LEFT ARM ARTERIOVENOUS GORETEX GRAFT (AVGG);  Surgeon: Cain, Brandon Christopher, MD;  Location: [facility] OR;  Service: Vascular;  Laterality: Left;   IR FLUORO GUIDE CV LINE LEFT  05/15/2020   IR REMOVAL TUN CV CATH W/O FL  05/15/2020   IR REMOVAL TUN CV CATH W/O FL  06/15/2021   IR US GUIDE VASC ACCESS LEFT  05/15/2020   NECK SURGERY     PEG PLACEMENT N/A 03/30/2023   Procedure: PERCUTANEOUS ENDOSCOPIC GASTROSTOMY (PEG) PLACEMENT;  Surgeon: Lovick, Ayesha N, MD;  Location: [facility] OR;  Service: General;  Laterality: N/A;   REDUCTION MAMMAPLASTY     TRACHEOSTOMY TUBE PLACEMENT N/A 03/15/2023   Procedure: AWAKE INTUBATION;  Surgeon: Bates, Dwight, MD;  Location: [facility] OR;  Service: ENT;   Laterality: N/A;    Family History  Problem Relation Age of Onset   Stroke Father    Cancer Father        unknown ca   Hypertension Mother    Diabetes Mother    Chronic Renal Failure Sister      Chronic Renal Failure Brother    Cancer Paternal Aunt        colon ca   Breast cancer Neg Hx     Prior to Admission medications   Medication Sig Start Date End Date Taking? Authorizing Provider  ceFAZolin (ANCEF) IVPB Inject 2 g into the vein Every Tuesday,Thursday,and Saturday with dialysis. Indication:  GBS bacteremia/vascular infxn First Dose: Yes Last Day of Therapy:  04/23/23 Labs - Once weekly:  CBC/D and BMP, ESR and CRP Method of administration: Per HD protocol Method of administration may be changed at the discretion of home infusion pharmacist based upon assessment of the patient and/or caregiver's ability to self-administer the medication ordered. 03/20/23 04/23/23 Yes Singh, Mayanka, MD  Accu-Chek FastClix Lancets MISC 5 (FIVE) TIMES DAILY DIAGNOSIS CODE E11.9 09/10/19   Vincent, Duncan Thomas, MD  acetaminophen (TYLENOL) 325 MG tablet Take 2 tablets (650 mg total) by mouth every 4 (four) hours as needed for mild pain (temp > 101.5). 12/24/21   Agarwala, Ravi, MD  atorvastatin (LIPITOR) 80 MG tablet TAKE 1 TABLET BY MOUTH EVERY DAY 12/31/22   Rogers, Tyler, MD  B-D UF III MINI PEN NEEDLES 31G X 5 MM MISC USE TO INJECT NOVOLOG 3 TIMES A DAY. DIAGNOSIS CODE E11.9, Z79.4 04/03/21   Atway, Rayann N, DO  BD VEO INSULIN SYRINGE U/F 31G X 15/64" 0.3 ML MISC USE TO INJECT LANTUS INSULIN TWO TIMES A DAY 07/11/22   Rogers, Tyler, MD  Blood Glucose Monitoring Suppl (ACCU-CHEK AVIVA PLUS) w/Device KIT Use to test blood glucose  3 times daily 12/15/16   Amin, Sumayya, MD  Cholecalciferol (VITAMIN D3) 50 MCG (2000 UT) capsule TAKE 1 CAPSULE BY MOUTH EVERY DAY 04/26/22   Atway, Rayann N, DO  cloNIDine (CATAPRES - DOSED IN MG/24 HR) 0.2 mg/24hr patch Place 1 patch (0.2 mg total) onto the skin once a week.  03/13/22   Patel, Monik, MD  glucose blood (ACCU-CHEK GUIDE) test strip TEST 5 (FIVE) TIMES DAILY DIAGNOSIS CODE E11.9 11/23/20   Watson, Julia, MD  hydrALAZINE (APRESOLINE) 50 MG tablet TAKE 1 TABLET BY MOUTH TWICE A DAY 12/31/22   Rogers, Tyler, MD  lidocaine-prilocaine (EMLA) cream Apply 1 application. topically as needed (dialysis).    [provider]  omeprazole (PRILOSEC) 20 MG capsule TAKE 1 CAPSULE BY MOUTH EVERY DAY 03/03/23   Rogers, Tyler, MD  sevelamer carbonate (RENVELA) 800 MG tablet Take 800 mg by mouth 3 (three) times daily with meals.    [provider]  verapamil (CALAN-SR) 180 MG CR tablet TAKE 2 TABLETS (360 MG TOTAL) BY MOUTH AT BEDTIME. 12/03/22   Rogers, Tyler, MD    Current Facility-Administered Medications  Medication Dose Route Frequency Provider Last Rate Last Admin   0.9 %  sodium chloride infusion   Intravenous PRN Simaan, Elizabeth S, PA-C   Stopped at 03/13/23 1623   acetaminophen (TYLENOL) tablet 650 mg  650 mg Per Tube Q6H PRN Simaan, Elizabeth S, PA-C   650 mg at 03/30/23 0808   atorvastatin (LIPITOR) tablet 80 mg  80 mg Per Tube Daily Simaan, Elizabeth S, PA-C   80 mg at 03/30/23 0809   ceFAZolin (ANCEF) IVPB 2g/100 mL premix  2 g Intravenous Q T,Th,Sat-1800 Simaan, Elizabeth S, PA-C   Stopped at 03/29/23 1813   Chlorhexidine Gluconate Cloth 2 % PADS 6 each  6 each Topical Q0600 Simaan, Elizabeth S, PA-C   6 each at 03/30/23 0400   Darbepoetin Alfa (ARANESP) injection 100 mcg  100   mcg Subcutaneous Q Sat-1800 Simaan, Elizabeth S, PA-C   100 mcg at 03/29/23 1744   famotidine (PEPCID) tablet 10 mg  10 mg Per Tube Daily Simaan, Elizabeth S, PA-C   10 mg at 03/30/23 0808   feeding supplement (NEPRO CARB STEADY) liquid 1,000 mL  1,000 mL Per Tube Continuous Simaan, Elizabeth S, PA-C 45 mL/hr at 03/30/23 0616 Infusion Verify at 03/30/23 0616   heparin injection 5,000 Units  5,000 Units Subcutaneous Q8H Atway, Rayann N, DO   5,000 Units at 03/30/23 0657    HYDROmorphone (DILAUDID) injection 0.5 mg  0.5 mg Intravenous Q8H PRN Vincent, Duncan Thomas, MD   0.5 mg at 03/30/23 0138   insulin aspart (novoLOG) injection 0-15 Units  0-15 Units Subcutaneous Q4H Simaan, Elizabeth S, PA-C   3 Units at 03/30/23 1244   insulin glargine-yfgn (SEMGLEE) injection 25 Units  25 Units Subcutaneous QHS Atway, Rayann N, DO   25 Units at 03/29/23 2214   loratadine (CLARITIN) tablet 10 mg  10 mg Per Tube Daily Simaan, Elizabeth S, PA-C   10 mg at 03/30/23 0809   multivitamin (RENA-VIT) tablet 1 tablet  1 tablet Per Tube QHS Simaan, Elizabeth S, PA-C   1 tablet at 03/29/23 2214   ondansetron (ZOFRAN) injection 4 mg  4 mg Intravenous Q6H PRN Simaan, Elizabeth S, PA-C   4 mg at 03/30/23 0353   Oral care mouth rinse  15 mL Mouth Rinse PRN Simaan, Elizabeth S, PA-C       Oral care mouth rinse  15 mL Mouth Rinse PRN Simaan, Elizabeth S, PA-C       Oral care mouth rinse  15 mL Mouth Rinse 4 times per day Simaan, Elizabeth S, PA-C   15 mL at 03/30/23 1250   polyethylene glycol (MIRALAX / GLYCOLAX) packet 17 g  17 g Per Tube Daily Simaan, Elizabeth S, PA-C   17 g at 03/30/23 0808   polyvinyl alcohol (LIQUIFILM TEARS) 1.4 % ophthalmic solution 1 drop  1 drop Both Eyes Q4H PRN Simaan, Elizabeth S, PA-C   1 drop at 03/19/23 0819   thiamine (VITAMIN B1) tablet 100 mg  100 mg Per Tube Daily Simaan, Elizabeth S, PA-C   100 mg at 03/30/23 0809    Allergies as of 03/04/2023 - Review Complete 02/27/2023  Allergen Reaction Noted   Hydrochlorothiazide Itching and Other (See Comments) 01/07/2014   Penicillins Other (See Comments)     Social History   Socioeconomic History   Marital status: Single    Spouse name: Not on file   Number of children: Not on file   Years of education: 11   Highest education level: Not on file  Occupational History    Employer: UNEMPLOYED    Employer: UNEMPLOYED  Tobacco Use   Smoking status: Former    Packs/day: .3    Types: Cigarettes    Quit date:  04/03/2021    Years since quitting: 1.9    Passive exposure: Never   Smokeless tobacco: Never   Tobacco comments:    1 cig per day   Vaping Use   Vaping Use: Never used  Substance and Sexual Activity   Alcohol use: No    Alcohol/week: 0.0 standard drinks of alcohol   Drug use: No   Sexual activity: Not on file  Other Topics Concern   Not on file  Social History Narrative   Not on file   Social Determinants of Health   Financial Resource Strain: Low Risk  (03/13/2022)     Overall Financial Resource Strain (CARDIA)    Difficulty of Paying Living Expenses: Not hard at all  Food Insecurity: No Food Insecurity (03/13/2022)   Hunger Vital Sign    Worried About Running Out of Food in the Last Year: Never true    Ran Out of Food in the Last Year: Never true  Transportation Needs: No Transportation Needs (03/13/2022)   PRAPARE - Transportation    Lack of Transportation (Medical): No    Lack of Transportation (Non-Medical): No  Physical Activity: Insufficiently Active (03/13/2022)   Exercise Vital Sign    Days of Exercise per Week: 4 days    Minutes of Exercise per Session: 30 min  Stress: No Stress Concern Present (03/13/2022)   [employer] - Occupational Stress Questionnaire    Feeling of Stress : Not at all  Social Connections: Socially Integrated (03/13/2022)   Social Connection and Isolation Panel [NHANES]    Frequency of Communication with Friends and Family: More than three times a week    Frequency of Social Gatherings with Friends and Family: More than three times a week    Attends Religious Services: More than 4 times per year    Active Member of Clubs or Organizations: Yes    Attends Club or Organization Meetings: 1 to 4 times per year    Marital Status: Living with partner  Intimate Partner Violence: Not At Risk (03/13/2022)   Humiliation, Afraid, Rape, and Kick questionnaire    Fear of Current or Ex-Partner: No    Emotionally Abused: No     Physically Abused: No    Sexually Abused: No     Code Status   Code Status: Full Code  Review of Systems: All systems reviewed and negative except where noted in HPI.  Physical Exam: Vital signs in last 24 hours: Temp:  [97.9 F (36.6 C)-98.3 F (36.8 C)] 98.3 F (36.8 C) (07/07 1111) Pulse Rate:  [92-105] 101 (07/07 0911) Resp:  [17-20] 18 (07/07 1111) BP: (95-169)/(48-95) 111/95 (07/07 1111) SpO2:  [92 %-98 %] 95 % (07/07 1111) Weight:  [38.7 kg-41.9 kg] 41.9 kg (07/07 0243) Last BM Date : 03/27/23  General:  frail female in NAD. Speech difficult to understand Psych:  Cooperative. Normal mood and affect Eyes: Pupils equal Ears:  Normal auditory acuity Nose: No deformity, discharge or lesions Neck:  Supple, no masses felt Lungs:  Clear to auscultation.  Heart:  Regular rate, irregular rhythm.  Abdomen:  Soft, nondistended, nontender, active bowel sounds, no masses felt. Gastrostomy tube in place.  Rectal :  Deferred Msk: Symmetrical without gross deformities.  Neurologic:  Alert, oriented, grossly normal neurologically Extremities : No edema   Intake/Output from previous day: 07/06 0701 - 07/07 0700 In: 2062.2 [I.V.:12.3; Blood:303; NG/GT:1644; IV Piggyback:102.9] Out: 601 [Emesis/NG output:1] Intake/Output this shift:  Total I/O In: 432 [I.V.:50; Blood:382] Out: -      Kyzen Horn, NP-C @  03/30/2023, 2:16 PM     

## 2023-03-30 NOTE — Progress Notes (Signed)
  Overnight events:  Hemoglobin 6.8 early this morning, 1 unit pRBC ordered.   Subjective: Patient resting comfortably in bed. Receiving 1u pRBC at time of exam. She says she feels fine this morning. No shortness of breath or coughing. Says her stool is normal and she has not noticed any bleeding. On 3.5L [place], turned down to 2.5L.   Objective:  Vital signs in last 24 hours: Vitals:   03/30/23 0625 03/30/23 0630 03/30/23 0741 03/30/23 0911  BP: 131/85 129/67 (!) 169/51 (!) 95/53  Pulse: 100 (!) 104 99 (!) 101  Resp: 18 18 18 18   Temp: 98.3 F (36.8 C) 98.3 F (36.8 C) 97.9 F (36.6 C) 97.9 F (36.6 C)  TempSrc: Oral Oral Oral Axillary  SpO2: 95% 95% 96% 96%  Weight:      Height:        Physical Exam: Constitutional: Comfortable, lying in bed Cardio: Regular rate and rhythm Abdomen: PEG tube in place; dressing dry, a little blood noted around the PEG tube site but nothing significant  Labs:  CBC: Hemoglobin 6.8 this morning; WBC 21.1, up from 18.7.   BMP: No acute electrolyte abnormalities.   Blood cultures: no growth after <12 hours  Assessment/Plan:  Principal Problem:   Bacteremia due to group B Streptococcus Active Problems:   Type 2 diabetes mellitus with complication (HCC)   Essential hypertension   ESRD (end stage renal disease) (HCC)   Acute hypoxic respiratory failure (HCC)   Microcytic anemia   Malignant neoplasm of pharynx, unspecified (HCC)   Protein-calorie malnutrition, severe   Dysphagia, pharyngoesophageal phase   Patient Summary: Susan Evans is a 67 year old female with a PMH of hypertension, diabetes, and ESRD on dialysis TTS, who is currently being treated for group B strep bacteremia and acute on chronic normocytic anemia.  Acute on chronic normocytic anemia ESRD on HD TTS Hemoglobin dropped again to 6.8 overnight and she was mildly tachycardic.  Transfused with 1 unit of packed red blood cells.  Baseline hemoglobin appears to be around  9-10 in the setting of ESRD.  Slight bleeding around PEG tube, but not significant.  No other active source of bleeding noted.  Reticulocyte index consistent with hypoproliferation. -Follow-up post-transfusion H/H -Trend daily CBC, BMP  -If hemoglobin less than 7, transfuse -HD TTS  Group B Strep Bacteremia White blood cells 21.1, up from 18.7 yesterday.  Afebrile.  Blood cultures ordered yesterday given white blood cell elevation.  No growth currently. -Continue IV cefazolin until 04/23/2023 -F/u blood cultures -Trend CBC daily  Dysphagia s/p PEG tube placement EG tube was placed on 7/5 by general surgery.  Patient is receiving nutrition and medications per tube.  Dressing dry, no active discharge.  Diet: NPO; tube feeds IVF: None VTE: Heparin 5,000 units q8h Code: Full AB: Cefazolin   Dispo: Anticipated discharge to  Home pending medical stability and safe dispo plan.   Annett Fabian, MD 03/30/2023, 9:29 AM Pager: [phone] After 5pm on weekdays and 1pm on weekends: On Call pager [phone]

## 2023-03-31 ENCOUNTER — Encounter (HOSPITAL_COMMUNITY)
Admission: EM | Disposition: E | Payer: Self-pay | Source: Home / Self Care | Attending: Student in an Organized Health Care Education/Training Program

## 2023-03-31 ENCOUNTER — Encounter (HOSPITAL_COMMUNITY): Payer: Self-pay | Admitting: Otolaryngology

## 2023-03-31 ENCOUNTER — Inpatient Hospital Stay (HOSPITAL_COMMUNITY): Payer: 59 | Admitting: Anesthesiology

## 2023-03-31 DIAGNOSIS — I120 Hypertensive chronic kidney disease with stage 5 chronic kidney disease or end stage renal disease: Secondary | ICD-10-CM | POA: Diagnosis not present

## 2023-03-31 DIAGNOSIS — I469 Cardiac arrest, cause unspecified: Secondary | ICD-10-CM | POA: Diagnosis not present

## 2023-03-31 DIAGNOSIS — K31811 Angiodysplasia of stomach and duodenum with bleeding: Secondary | ICD-10-CM | POA: Diagnosis not present

## 2023-03-31 DIAGNOSIS — Z992 Dependence on renal dialysis: Secondary | ICD-10-CM

## 2023-03-31 DIAGNOSIS — N186 End stage renal disease: Secondary | ICD-10-CM

## 2023-03-31 DIAGNOSIS — R7881 Bacteremia: Secondary | ICD-10-CM | POA: Diagnosis not present

## 2023-03-31 DIAGNOSIS — R1310 Dysphagia, unspecified: Secondary | ICD-10-CM | POA: Diagnosis not present

## 2023-03-31 DIAGNOSIS — B951 Streptococcus, group B, as the cause of diseases classified elsewhere: Secondary | ICD-10-CM | POA: Diagnosis not present

## 2023-03-31 HISTORY — PX: HOT HEMOSTASIS: SHX5433

## 2023-03-31 HISTORY — PX: ESOPHAGOGASTRODUODENOSCOPY (EGD) WITH PROPOFOL: SHX5813

## 2023-03-31 LAB — CBC
HCT: 22.1 % — ABNORMAL LOW (ref 36.0–46.0)
Hemoglobin: 7.2 g/dL — ABNORMAL LOW (ref 12.0–15.0)
MCH: 29.4 pg (ref 26.0–34.0)
MCHC: 32.6 g/dL (ref 30.0–36.0)
MCV: 90.2 fL (ref 80.0–100.0)
Platelets: 251 10*3/uL (ref 150–400)
RBC: 2.45 MIL/uL — ABNORMAL LOW (ref 3.87–5.11)
RDW: 17.8 % — ABNORMAL HIGH (ref 11.5–15.5)
WBC: 19.6 10*3/uL — ABNORMAL HIGH (ref 4.0–10.5)
nRBC: 0.9 % — ABNORMAL HIGH (ref 0.0–0.2)

## 2023-03-31 LAB — GLUCOSE, CAPILLARY
Glucose-Capillary: 116 mg/dL — ABNORMAL HIGH (ref 70–99)
Glucose-Capillary: 45 mg/dL — ABNORMAL LOW (ref 70–99)
Glucose-Capillary: 50 mg/dL — ABNORMAL LOW (ref 70–99)
Glucose-Capillary: 200 mg/dL — ABNORMAL HIGH (ref 70–99)
Glucose-Capillary: 158 mg/dL — ABNORMAL HIGH (ref 70–99)
Glucose-Capillary: 114 mg/dL — ABNORMAL HIGH (ref 70–99)

## 2023-03-31 LAB — BPAM RBC
Blood Product Expiration Date: 202407232359
Blood Product Expiration Date: 202407262359
ISSUE DATE / TIME: 202407041019
Unit Type and Rh: 6200

## 2023-03-31 LAB — BASIC METABOLIC PANEL
Anion gap: 13 (ref 5–15)
BUN: 93 mg/dL — ABNORMAL HIGH (ref 8–23)
CO2: 25 mmol/L (ref 22–32)
Calcium: 8.3 mg/dL — ABNORMAL LOW (ref 8.9–10.3)
Chloride: 93 mmol/L — ABNORMAL LOW (ref 98–111)
Creatinine, Ser: 3.88 mg/dL — ABNORMAL HIGH (ref 0.44–1.00)
GFR, Estimated: 12 mL/min — ABNORMAL LOW (ref >60)
Glucose, Bld: 82 mg/dL (ref 70–99)
Potassium: 4.3 mmol/L (ref 3.5–5.1)
Sodium: 131 mmol/L — ABNORMAL LOW (ref 135–145)

## 2023-03-31 LAB — TYPE AND SCREEN
ABO/RH(D): A POS
Antibody Screen: NEGATIVE

## 2023-03-31 SURGERY — ESOPHAGOGASTRODUODENOSCOPY (EGD) WITH PROPOFOL
Anesthesia: Monitor Anesthesia Care

## 2023-03-31 MED ORDER — EPINEPHRINE 1 MG/10ML IJ SOSY
PREFILLED_SYRINGE | INTRAMUSCULAR | Status: DC | PRN
  Administered 2023-03-31: 100 ug via INTRAVENOUS
  Administered 2023-03-31: 1 mg via INTRAVENOUS
  Administered 2023-03-31: 400 ug via INTRAVENOUS
  Administered 2023-03-31: 500 ug via INTRAVENOUS
  Administered 2023-03-31: 1 mg via INTRAVENOUS

## 2023-03-31 MED ORDER — EPINEPHRINE HCL 5 MG/250ML IV SOLN IN NS
INTRAVENOUS | Status: DC | PRN
  Administered 2023-03-31: 5 ug/min via INTRAVENOUS

## 2023-03-31 MED ORDER — ATROPINE SULFATE 0.4 MG/ML IV SOLN
INTRAVENOUS | Status: DC | PRN
  Administered 2023-03-31: 1 mg via INTRAVENOUS

## 2023-03-31 MED ORDER — SODIUM CHLORIDE 0.9 % IV SOLN: INTRAVENOUS | Status: DC | PRN

## 2023-03-31 MED ORDER — PROPOFOL 500 MG/50ML IV EMUL
INTRAVENOUS | Status: DC | PRN
  Administered 2023-03-31: 20 ug/kg/min via INTRAVENOUS

## 2023-03-31 MED ORDER — SODIUM BICARBONATE 8.4 % IV SOLN
INTRAVENOUS | Status: DC | PRN
  Administered 2023-03-31: 1 mg via INTRAVENOUS

## 2023-03-31 MED ORDER — IPRATROPIUM-ALBUTEROL 0.5-2.5 (3) MG/3ML IN SOLN
3.0000 mL | Freq: Once | RESPIRATORY_TRACT | Status: AC
  Administered 2023-03-31: 3 mL via RESPIRATORY_TRACT
  Filled 2023-03-31: qty 3

## 2023-03-31 MED ORDER — EPHEDRINE SULFATE-NACL 50-0.9 MG/10ML-% IV SOSY
PREFILLED_SYRINGE | INTRAVENOUS | Status: DC | PRN
  Administered 2023-03-31: 5 mg via INTRAVENOUS

## 2023-03-31 MED ORDER — DEXTROSE 50 % IV SOLN
INTRAVENOUS | Status: AC
  Administered 2023-03-31: 50 mL
  Filled 2023-03-31: qty 50

## 2023-03-31 SURGICAL SUPPLY — 15 items

## 2023-04-01 ENCOUNTER — Encounter: Payer: 59 | Admitting: Internal Medicine

## 2023-04-01 LAB — CULTURE, BLOOD (ROUTINE X 2): Special Requests: ADEQUATE

## 2023-04-02 ENCOUNTER — Encounter (HOSPITAL_COMMUNITY): Payer: Self-pay | Admitting: Gastroenterology

## 2023-04-02 LAB — CULTURE, BLOOD (ROUTINE X 2)
Culture: NO GROWTH
Special Requests: ADEQUATE

## 2023-04-04 MED FILL — Medication: Qty: 1 | Status: AC

## 2023-04-09 ENCOUNTER — Inpatient Hospital Stay: Payer: 59 | Admitting: Family

## 2023-04-24 NOTE — Progress Notes (Signed)
   04/04/2023 1431  Spiritual Encounters  Care provided to: Family  Referral source Family  Reason for visit End-of-life  OnCall Visit No  Spiritual Framework  Presenting Themes Other (comment)  Interventions  Spiritual Care Interventions Made Prayer;Reflective listening;Supported grief process  Intervention Outcomes  Outcomes Connection to spiritual care;Other (comment)   Received call from unit advised that patient family in need of a chaplain as patient passed away during medical procedure. On way to room was met by a relative that recognized chaplain from prior visit. Was advised that patient was the mother of Rance Muir, an employee of [employer] who was at work when she received the news of her mother's passing. Went to see daughter of patient. Daughter collapse into chaplain's arms, weeping and wailing. Family friend also weeping. Talked with both. Prayed with both. Daughter upset that she is unable to see the patient until after the medical examiners. Phone rang several times, other family members wanting to come to hospital, explained they will be unable to see patient at this time. One family member was informed at his place of employment and proceeded to punch a hole in the wall. Family attempting to keep others away. Daughter stated she will have to leave to keep family members from coming to the hospital. Daughter's pastor arrived and began ministering to family members present. Chaplain dismissed self until further notice.

## 2023-04-24 NOTE — Anesthesia Preprocedure Evaluation (Addendum)
Anesthesia Evaluation  Patient identified by MRN, date of birth, ID band Patient awake    Reviewed: Allergy & Precautions, NPO status , Patient's Chart, lab work & pertinent test results  History of Anesthesia Complications (+) DIFFICULT AIRWAY and history of anesthetic complications  Airway Mallampati: IV  TM Distance: >3 FB Neck ROM: Limited    Dental  (+) Edentulous Upper   Pulmonary former smoker  Respiratory failure s/p fiberoptic intubation 6/18, since extubated, now on [place] O2     + decreased breath sounds      Cardiovascular hypertension, Pt. on medications Normal cardiovascular exam     Neuro/Psych negative neurological ROS  negative psych ROS   GI/Hepatic Neg liver ROS,GERD  Medicated and Controlled,,  Endo/Other  diabetes, Type 2 Hyperthyroidism  Na 131   Renal/GU ESRF and DialysisRenal disease     Musculoskeletal negative musculoskeletal ROS (+)    Abdominal   Peds  Hematology  (+) Blood dyscrasia, anemia   Anesthesia Other Findings Malnutrition Head/neck cancer s/p radiation Difficult airway   Reproductive/Obstetrics                             Anesthesia Physical Anesthesia Plan  ASA: 4  Anesthesia Plan: MAC   Post-op Pain Management: Minimal or no pain anticipated   Induction: Intravenous  PONV Risk Score and Plan: 2 and Treatment may vary due to age or medical condition and Propofol infusion  Airway Management Planned: Natural Airway and Nasal Cannula  Additional Equipment: None  Intra-op Plan:   Post-operative Plan:   Informed Consent: I have reviewed the patients History and Physical, chart, labs and discussed the procedure including the risks, benefits and alternatives for the proposed anesthesia with the patient or authorized representative who has indicated his/her understanding and acceptance.       Plan Discussed with: CRNA and  Anesthesiologist  Anesthesia Plan Comments:        Anesthesia Quick Evaluation

## 2023-04-24 NOTE — Progress Notes (Signed)
SLP Cancellation Note  Patient Details Name: Susan Evans MRN: [id number] DOB: 1955-11-22   Cancelled treatment:       Reason Eval/Treat Not Completed: Patient at procedure or test/unavailable   Evalynne Locurto, Riley Nearing 04/02/2023, 12:09 PM

## 2023-04-24 NOTE — Transfer of Care (Signed)
Immediate Anesthesia Transfer of Care Note  Patient: Susan Evans  Procedure(s) Performed: ESOPHAGOGASTRODUODENOSCOPY (EGD) WITH PROPOFOL HOT HEMOSTASIS (ARGON PLASMA COAGULATION/BICAP)  Pt deceased Time of death: 37

## 2023-04-24 NOTE — Op Note (Addendum)
[facility] Patient Name: Susan Evans Procedure Date : 04/02/2023 MRN: [id number] Attending MD: Dub Amis. Tomasa Rand , MD, [id number] Date of Birth: December 03, 1955 CSN: [id number] Age: 67 Admit Type: Inpatient Procedure:                Upper GI endoscopy Indications:              Suspected upper gastrointestinal bleeding Providers:                Lorin Picket E. Tomasa Rand, MD, Stephens Shire RN, RN,                            Cephus Richer, RN, Sunday Corn Mbumina, Technician Referring MD:              Medicines:                Monitored Anesthesia Care Complications:            Death (PEA arrest) Estimated Blood Loss:     Estimated blood loss was minimal. Procedure:                Pre-Anesthesia Assessment:                           - Prior to the procedure, a History and Physical                            was performed, and patient medications and                            allergies were reviewed. The patient's tolerance of                            previous anesthesia was also reviewed. The risks                            and benefits of the procedure and the sedation                            options and risks were discussed with the patient.                            All questions were answered, and informed consent                            was obtained. Prior Anticoagulants: The patient has                            taken heparin, last dose was 1 day prior to                            procedure. ASA Grade Assessment: IV - A patient                            with severe systemic disease that is a constant  threat to life. After reviewing the risks and                            benefits, the patient was deemed in satisfactory                            condition to undergo the procedure.                           After obtaining informed consent, the endoscope was                            passed under direct vision. Throughout the                             procedure, the patient's blood pressure, pulse, and                            oxygen saturations were monitored continuously. The                            [id number]-[id number] ([id number]) Olympus endoscope was introduced                            through the mouth, and advanced to the second part                            of duodenum. The upper GI endoscopy was                            accomplished without difficulty. The patient                            tolerated the procedure poorly due to the patient's                            cardiovascular instability. During the procedure,                            the patient's heart rate was noted to have slowed                            into the 40s. Whereas the patient's blood pressure                            had been stable, the next cycle showed a sudden                            drop in blood pressure, with systolics in the 50s.                            The procedure was abruptly terminated, and a pulse  check was performed. No definitive pulse was                            detected and CPR was initiated. PEA arrest protocol                            was initiated with CPR, emergent intubation and                            epinephrine injections followed by epinephrine drip                            and bicarbonate infusion. Please see anesthesia                            records for complete details of medications                            administered and vitals during procedure. Critical                            care team arrived to help with the resuscitation,                            which was eventually stopped after about 20 minutes                            without ROSC. Scope In: Scope Out: Findings:      The pharynx tissue was edematous and esophageal intubation was mildly       difficult.      A careful exam of the esophagus was not performed during insertion, but        brownish discoloration of the esophagus was noted. This could have been       refluxed gastric content.      A few small angiodysplastic lesions with bleeding were found in the       gastric body. Coagulation for hemostasis using argon plasma was       successful. Estimated blood loss was minimal.      There was evidence of an intact gastrostomy with a patent G-tube present       in the gastric body.      Clotted blood was found in the gastric body.      Brownish liquid residue was found in the second portion of the duodenum.       The underlying mucosa appeared unremarkable. Impression:               - Mucosal changes in the esophagus.                           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 This was the source of the patient's GI  bleed. Treated with argon plasma coagulation (APC).                           - Intact gastrostomy with a patent G-tube present.                           - Clotted blood in the gastric body from the                            angiodysplastic lesions.                           - Retained liquid food debris in the duodenum.                           - No specimens collected.                           - Exact etiology for PEA arrest unclear. Moderate Sedation:      N/A Recommendation:           - Two of the patient's brothers were notified of                            the unexpected death by myself and Dr. Mal Amabile and                            their questions were answered to best of our                            ability. Procedure Code(s):        --- Professional ---                           [phone], Esophagogastroduodenoscopy, flexible,                            transoral; with control of bleeding, any method Diagnosis Code(s):        --- Professional ---                           K22.89, Other specified disease of esophagus                           K31.811, Angiodysplasia of  stomach and duodenum                            with bleeding                           Z93.1, Gastrostomy status                           K92.2, Gastrointestinal hemorrhage, unspecified CPT copyright 2022 American Medical Association. All rights reserved. The codes documented in this report are preliminary and upon coder review may  be revised to meet current compliance requirements. Evalyse Stroope  Okey Regal, MD 04/15/2023 2:24:00 PM This report has been signed electronically. Number of Addenda: 0

## 2023-04-24 NOTE — Procedures (Signed)
Cardiopulmonary Resuscitation Note  Susan Evans  [id number]  09-29-1955  Date:03/29/2023  Time:1:22 PM   Provider Performing:Vestal Markin   Procedure: Cardiopulmonary Resuscitation (92950)  Indication(s) Loss of Pulse  Consent N/A  Anesthesia N/A   Time Out N/A   Sterile Technique Hand hygiene, gloves   Procedure Description Called to patient's room for CODE BLUE. Initial rhythm was PEA/Asystole. Patient received high quality chest compressions for 19  minutes . Epinephrine was administered every 5 minutes as directed by time Biomedical engineer. Additional pharmacologic interventions included sodium bicarbonate.  Return of spontaneous circulation was not achieved.  Family to be notified.   Complications/Tolerance N/A   EBL N/A   Specimen(s) N/A  Estimated time to ROSC: Not achieved

## 2023-04-24 NOTE — Progress Notes (Signed)
Physical Therapy Treatment Patient Details Name: Susan Evans MRN: [id number] DOB: 1956/05/07 Today's Date: 04/02/2023   History of Present Illness 67 yo female presents to [facility] on 6/18 with lip swelling with subglottic edema/ compromised airway, s/p fiberoptic intubation 6/18-6/24. Other dx includes bacteremia. Dysphagia with cortrak. 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    PT Comments  Today's session focused on functional mobility, activity tolerance, and AD management. Pt able to demo bed mobility with min A provided for push to upright seated EOB, multimodal cues for BLE navigation and hand placement on bed rail. Pt with min A for sit<>stand using rollator, hand placement cues needed but demos adequate push-off to stand. Amb 150' using rollator, cues for proximity and otherwise limited by abdominal pain and fatigue. Pt will continue to benefit from skilled PT during their acute admission to facilitate further improvements in functional mobility, activity tolerance and AD management.    Assistance Recommended at Discharge Intermittent Supervision/Assistance  If plan is discharge home, recommend the following:  Can travel by private vehicle    A little help with walking and/or transfers;A little help with bathing/dressing/bathroom;Assistance with cooking/housework;Assist for transportation;Help with stairs or ramp for entrance      Equipment Recommendations  Rollator (4 wheels)    Recommendations for Other Services       Precautions / Restrictions Precautions Precautions: Fall Restrictions Weight Bearing Restrictions: No     Mobility  Bed Mobility Overal bed mobility: Needs Assistance Bed Mobility: Supine to Sit     Supine to sit: Min assist Sit to supine: Modified independent (Device/Increase time)   General bed mobility comments: Min assist to bring trunk up to  sitting    Transfers Overall transfer level: Needs assistance Equipment used: Rollator (4 wheels) Transfers: Sit to/from Stand Sit to Stand: Min assist           General transfer comment: Min instructional cueing for hand placement sit to stand, demos adequate push-off.    Ambulation/Gait Ambulation/Gait assistance: Min guard Gait Distance (Feet): 150 Feet Assistive device: Rollator (4 wheels) Gait Pattern/deviations: Step-through pattern, Decreased stride length Gait velocity: decr Gait velocity interpretation: <1.8 ft/sec, indicate of risk for recurrent falls   General Gait Details: cues to maintain proximity to rollator, tends to push device far in front. Demos single extremity support throughout while holding abdomen with LUE, continues to amb and manage rollator with RUE.   Stairs             Wheelchair Mobility     Tilt Bed    Modified Rankin (Stroke Patients Only)       Balance Overall balance assessment: Needs assistance Sitting-balance support: No upper extremity supported, Feet supported Sitting balance-Leahy Scale: Good Sitting balance - Comments: Fwd flexed trunk   Standing balance support: Bilateral upper extremity supported, Single extremity supported, During functional activity Standing balance-Leahy Scale: Fair                              Cognition Arousal/Alertness: Lethargic Behavior During Therapy: WFL for tasks assessed/performed Overall Cognitive Status: No family/caregiver present to determine baseline cognitive functioning                                 General Comments: Increased time for bed mobility. Pt with  multimodal cues required for hand positioning in sit<>stand and with rollator management.        Exercises      General Comments General comments (skin integrity, edema, etc.): VSS on RA      Pertinent Vitals/Pain Pain Assessment Pain Assessment: Faces Faces Pain Scale: Hurts little  more Pain Location: abdomen Pain Descriptors / Indicators: Grimacing, Guarding Pain Intervention(s): Limited activity within patient's tolerance, Monitored during session    Home Living                          Prior Function            PT Goals (current goals can now be found in the care plan section) Acute Rehab PT Goals Patient Stated Goal: home PT Goal Formulation: With patient Time For Goal Achievement: 04/01/23 Potential to Achieve Goals: Good Progress towards PT goals: Progressing toward goals    Frequency    Min 2X/week      PT Plan Current plan remains appropriate    Co-evaluation              AM-PAC PT "6 Clicks" Mobility   Outcome Measure  Help needed turning from your back to your side while in a flat bed without using bedrails?: None Help needed moving from lying on your back to sitting on the side of a flat bed without using bedrails?: None Help needed moving to and from a bed to a chair (including a wheelchair)?: A Little Help needed standing up from a chair using your arms (e.g., wheelchair or bedside chair)?: A Little Help needed to walk in hospital room?: A Little Help needed climbing 3-5 steps with a railing? : A Lot 6 Click Score: 19    End of Session Equipment Utilized During Treatment: Gait belt Activity Tolerance: Patient tolerated treatment well Patient left: in bed;with call bell/phone within reach;with bed alarm set Nurse Communication: Mobility status PT Visit Diagnosis: Other abnormalities of gait and mobility (R26.89);Muscle weakness (generalized) (M62.81)     Time: 1610-9604 PT Time Calculation (min) (ACUTE ONLY): 17 min  Charges:    $Therapeutic Activity: 8-22 mins PT General Charges $$ ACUTE PT VISIT: 1 Visit                     Susan Evans, SPT  Acute Rehabilitation Services    Susan Evans 04/22/2023, 11:12 AM

## 2023-04-24 NOTE — Anesthesia Procedure Notes (Addendum)
Procedure Name: Intubation Date/Time: 03/25/2023 1:47 PM  Performed by: [name], MDPre-anesthesia Checklist: Patient identified, Emergency Drugs available, Suction available and Patient being monitored Patient Re-evaluated:Patient Re-evaluated prior to induction Oxygen Delivery Method: Circle System Utilized Preoxygenation: Pre-oxygenation with 100% oxygen Induction Type: Rapid sequence Ventilation: Oral airway inserted - appropriate to patient size Laryngoscope Size: Glidescope and 3 Grade View: Grade II Tube type: Oral Tube size: 7.0 mm Number of attempts: 1 Airway Equipment and Method: Rigid stylet and Video-laryngoscopy Placement Confirmation: ETT inserted through vocal cords under direct vision, positive ETCO2 and breath sounds checked- equal and bilateral Secured at: 22 cm Tube secured with: Tape Dental Injury: Teeth and Oropharynx as per pre-operative assessment

## 2023-04-24 NOTE — Progress Notes (Signed)
   04/23/2023 1518  Spiritual Encounters  Type of Visit Follow up  Care provided to: Family  Reason for visit End-of-life  OnCall Visit No   Received a call to meet with family of patient as patient cleared to be viewed by family. Went to endoscopy room where family gathered. Family where inside and outside of the room, over flowing into hallway. Approximately 20 - 25 people with many more expected to arrive. Escorted groups into the room to view and say good bye to patient while daughter, granddaughter and three small great-grandchildren where standing at the bedside. After approximately 30 minutes of continued traffic, the entire group gathered in the patient's room for prayer. Chaplain prayed with family. Per nurse it was time to transport patient to morgue, however family not ready to leave hospital and were expecting more family members to arrive. Nurses where attempting to find a place for the family to gather as the consult room was to small and the meeting/conference room was in use. Nurse suggested chapel. Explain that they were welcome in the chapel however chaplain can not close the chapel to the public as chaple is place of worship and family want to gather perhaps we can locate another place where the family will be comfortable and un-bothered.  Family insist on returning to the 4th floor north tower as that is where the rest of the family members arriving are going and the patient's room was located there. Family will not be able to congregate in patient's room. Family state they will congregate in waiting area and discuss family matters. The nurse informed them that it would be alright and escorted the family to 4N, while chaplain remained in the rear waiting on daughter and granddaughter to collect their belonging. Chaplain met larger family in waiting area and advised that if they need anything else please have the chaplain office contacted. Chaplain responded to an additional call while nurse  also departed from family in waiting area.

## 2023-04-24 NOTE — Progress Notes (Signed)
   Subjective: Patient up working with physical therapy at the time of exam.  Required minimal assist.  No new concerns.  Objective:  Vital signs in last 24 hours: Vitals:   04/02/2023 0740 04/09/2023 0800 03/30/2023 0900 04/23/2023 1110  BP: (!) 152/56 (!) 166/72  131/63  Pulse: 91 95 98 98  Resp: 19   16  Temp: 97.7 F (36.5 C)   (!) 97.2 F (36.2 C)  TempSrc: Oral   Temporal  SpO2: 93% 93% 92% 100%  Weight:      Height:    5\' 2"  (1.575 m)   Physical Exam: Constitutional: Patient was up working with PT at the time of exam.  Appeared to have some weakness and fatigue but in no acute distress.  Pulm: No increased work of breathing, some wheezes noted  Labs:  CBC: Hemoglobin 7.2 this morning; WBC 19.6, up from 18.4.   BMP: No acute electrolyte abnormalities.  Changes consistent with ESRD.  Blood cultures: no growth after 48 hours  Assessment/Plan:  Principal Problem:   Bacteremia due to group B Streptococcus Active Problems:   Type 2 diabetes mellitus with complication (HCC)   Essential hypertension   ESRD (end stage renal disease) (HCC)   Acute hypoxic respiratory failure (HCC)   Microcytic anemia   Malignant neoplasm of pharynx, unspecified (HCC)   Protein-calorie malnutrition, severe   Dysphagia, pharyngoesophageal phase   Patient Summary: Susan Evans is a 68 year old female with a PMH of hypertension, diabetes, and ESRD on dialysis TTS, who is currently being treated for group B strep bacteremia and acute on chronic normocytic anemia secondary to suspected upper GI bleed.  Acute on chronic normocytic anemia ESRD on HD TTS Hemoglobin 7.2 this morning, down from 8.3 yesterday. G-tube was flushed yesterday by GI, coffee-ground fluid was noted. NPO at midnight for EGD with GI today.  No acute electrolyte abnormalities.   -Follow-up EGD results -Trend daily CBC, BMP -If hemoglobin <7 tomorrow morning, transfuse with dialysis -HD TTS  Group B Strep Bacteremia White  blood cells 19.6, up from 18.4 yesterday.  Afebrile.  Culture showing no growth after 48 hours. -Continue IV cefazolin until 04/23/2023 -Trend CBC daily  Hypoglycemia, resolved Patient was hypoglycemic this morning with a blood glucose of 45.  She was given D50 and her hypoglycemia resolved.  This was likely due to being NPO overnight. -Continue to monitor blood glucose levels  Dysphagia s/p PEG tube placement EG tube was placed on 7/5 by general surgery.  Patient is receiving nutrition and medications per tube.  No new concerns.  Diet: NPO; tube feeds IVF: None VTE: Held Code: Full AB: Cefazolin   Dispo: Anticipated discharge to  Home vs SNF pending medical stability and safe dispo plan.   Annett Fabian, MD 04/17/2023, 1:12 PM Pager: [phone] After 5pm on weekdays and 1pm on weekends: On Call pager [phone]

## 2023-04-24 NOTE — Death Summary Note (Cosign Needed Addendum)
  Name: Susan Evans MRN: [id number] DOB: 1955-12-01 67 y.o.  Date of Admission: 03/16/2023  8:42 AM Date of Discharge: 04/10/2023 Attending Physician: Tyson Alias, *  Discharge Diagnosis: Principal Problem:   Bacteremia due to group B Streptococcus Active Problems:   Type 2 diabetes mellitus with complication (HCC)   Essential hypertension   ESRD (end stage renal disease) (HCC)   Acute hypoxic respiratory failure (HCC)   Microcytic anemia   Malignant neoplasm of pharynx, unspecified (HCC)   Protein-calorie malnutrition, severe   Dysphagia, pharyngoesophageal phase  Cause of death: PEA Arrest Time of death: 13:22  Disposition and follow-up:   [name] was discharged from [facility] in expired condition.   Hospital Course:  [name] is a 67 y.o. 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 She was found to have angioedema that required intubation. She was extubated and stepped down to the internal medicine floor where she was found to have group B streptococcus bacteremia and acute on chronic normocytic anemia. She was being treated for her infection with IV Cefazolin. Her acute on chronic anemia was thought to be secondary to her end stage renal disease and complicated by a possible GI bleed because she required blood transfusions every few days. Thus, we consulted GI for endoscopy. They aspirated dark blood from her PEG tube. She went today for an EGD to look for a source of upper GI bleeding. She was given propofol prior to the procedure and became hypotensive. While identifying the source of bleeding, the patient became bradycardic and went into PEA arrest. ACLS was underway for 20 minutes, after which time of death was called.   Signed: Annett Fabian, MD 04/08/2023, 1:46 PM

## 2023-04-24 NOTE — Interval H&P Note (Signed)
History and Physical Interval Note:  03/24/2023 11:55 AM  Susan Evans  has presented today for surgery, with the diagnosis of upper gastrointestinal bleeding.  The various methods of treatment have been discussed with the patient and family. After consideration of risks, benefits and other options for treatment, the patient has consented to  Procedure(s): ESOPHAGOGASTRODUODENOSCOPY (EGD) WITH PROPOFOL (N/A) as a surgical intervention.  The patient's history has been reviewed, patient examined, no change in status, stable for surgery.  I have reviewed the patient's chart and labs.  Questions were answered to the patient's satisfaction.     Jenel Lucks

## 2023-04-24 NOTE — Anesthesia Postprocedure Evaluation (Signed)
Anesthesia Post Note  Patient: Susan Evans  Procedure(s) Performed: ESOPHAGOGASTRODUODENOSCOPY (EGD) WITH PROPOFOL HOT HEMOSTASIS (ARGON PLASMA COAGULATION/BICAP)     Patient location during evaluation: Endoscopy Anesthesia Type: MAC Post-procedure mental status: patient expired. Anesthetic complications: yes Comments: Patient suffered cardiac arrest during the procedure. CPR performed, see documentation. Unable to achieve ROSC. Patient expired.   No notable events documented.  Last Vitals:  Vitals:   04/21/2023 0900 03/30/2023 1110  BP:  131/63  Pulse: 98 98  Resp:  16  Temp:  (!) 36.2 C  SpO2: 92% 100%    Last Pain:  Vitals:   04/02/2023 1110  TempSrc: Temporal  PainSc: 7                  [name]

## 2023-04-24 DEATH — deceased

## 2023-11-29 IMAGING — DX DG CHEST 1V PORT
1 series · 1 of 1 positions shown · non-contrast
Comparison: Previous studies including the examination of
08/12/2019

CLINICAL DATA: Difficulty breathing endotracheal tube placement

EXAM:
PORTABLE CHEST 1 VIEW

[chest]
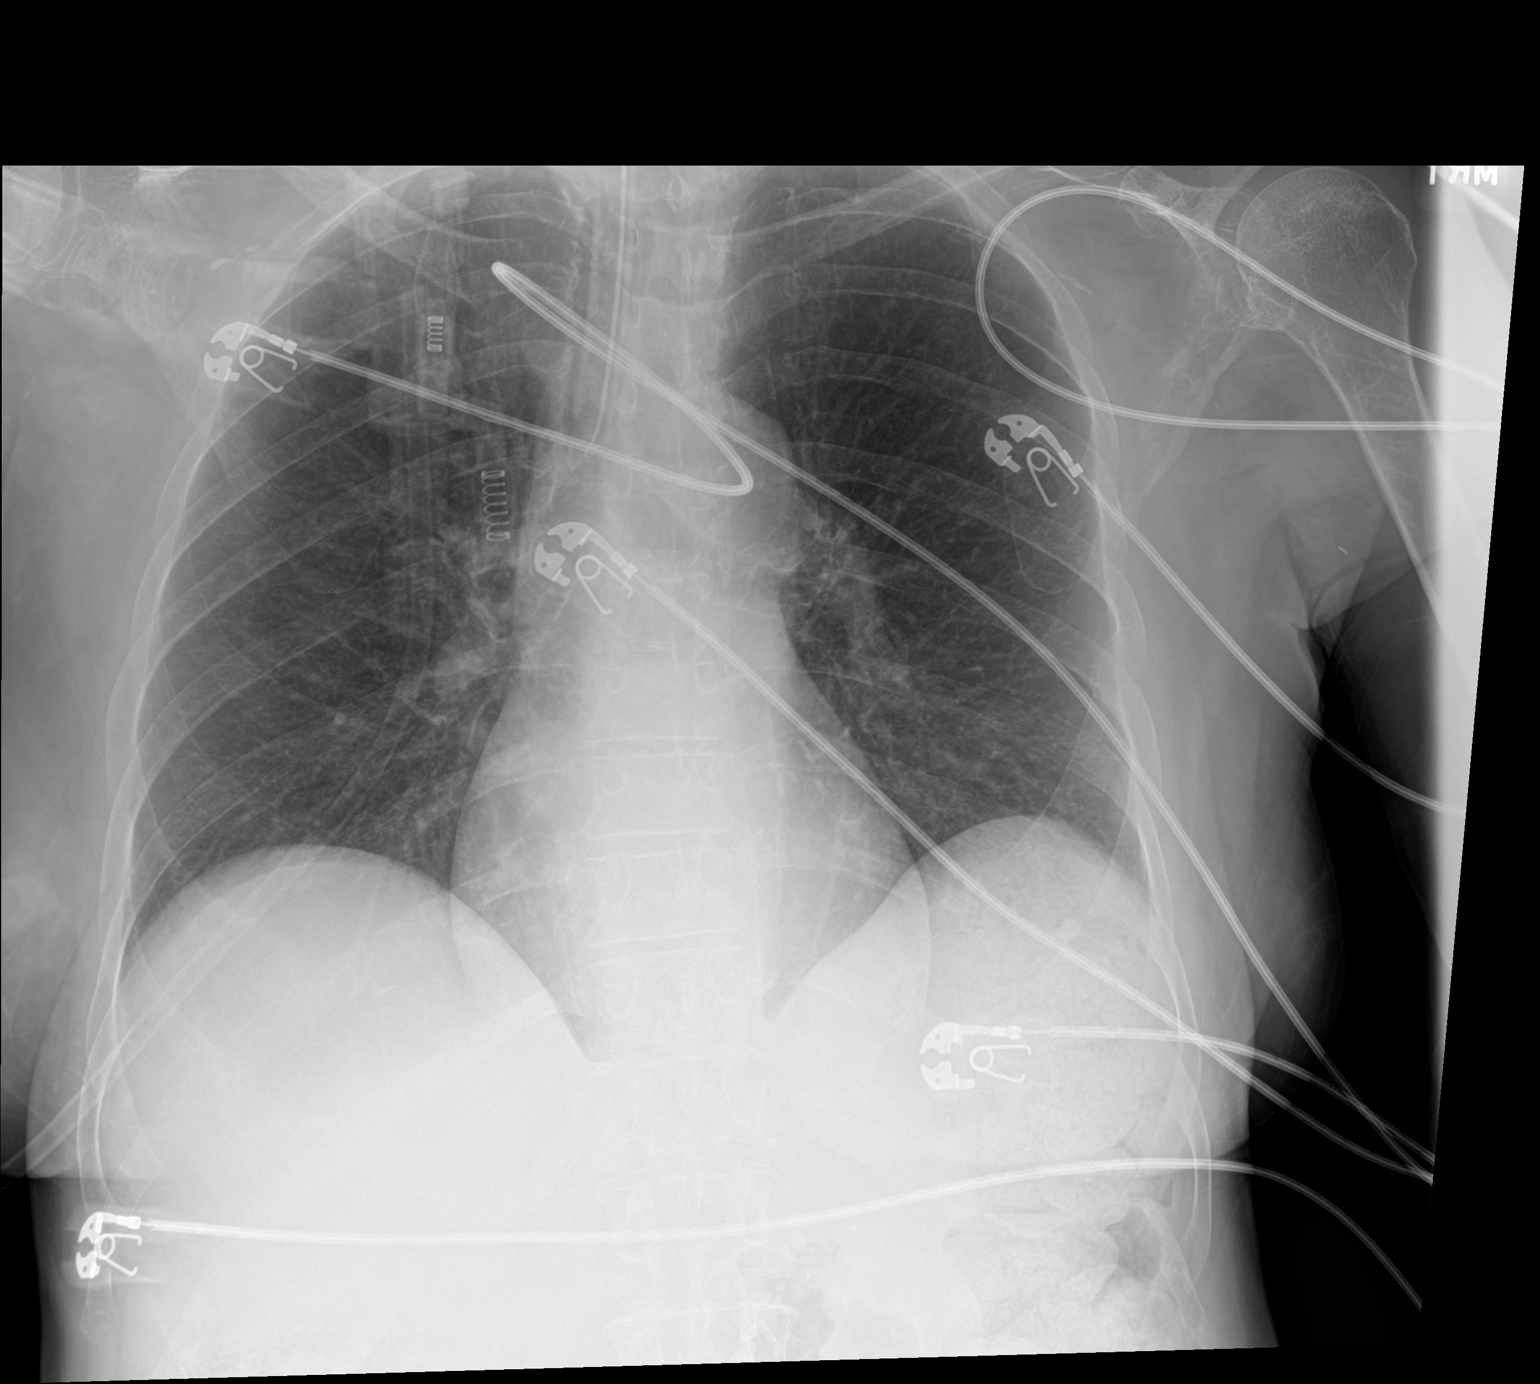

[1 of 1 positions shown; findings below may reference images not displayed]

FINDINGS: Cardiac size is within normal limits. Lung fields are clear of any
infiltrates or pulmonary edema. There is no pleural effusion or
pneumothorax. Tip of endotracheal tube is proximally 1.9 cm above
the carina.
IMPRESSION: Tip of endotracheal tube is 1.9 cm above the carina. Lung fields are
clear of any infiltrates or pulmonary edema.
